# Patient Record
Sex: Female | Born: 1965
Health system: Southern US, Community
[De-identification: ages and names within clinical notes are randomized; demographics above are authoritative.]

## PROBLEM LIST (undated history)

## (undated) DIAGNOSIS — G2581 Restless legs syndrome: Secondary | ICD-10-CM

## (undated) DIAGNOSIS — E785 Hyperlipidemia, unspecified: Secondary | ICD-10-CM

## (undated) DIAGNOSIS — K219 Gastro-esophageal reflux disease without esophagitis: Secondary | ICD-10-CM

## (undated) DIAGNOSIS — I2510 Atherosclerotic heart disease of native coronary artery without angina pectoris: Secondary | ICD-10-CM

## (undated) DIAGNOSIS — R7303 Prediabetes: Secondary | ICD-10-CM

## (undated) DIAGNOSIS — F909 Attention-deficit hyperactivity disorder, unspecified type: Secondary | ICD-10-CM

## (undated) DIAGNOSIS — F419 Anxiety disorder, unspecified: Secondary | ICD-10-CM

## (undated) DIAGNOSIS — F32A Depression, unspecified: Secondary | ICD-10-CM

## (undated) DIAGNOSIS — G4733 Obstructive sleep apnea (adult) (pediatric): Secondary | ICD-10-CM

## (undated) DIAGNOSIS — R112 Nausea with vomiting, unspecified: Secondary | ICD-10-CM

## (undated) DIAGNOSIS — Z860100 Personal history of colon polyps, unspecified: Secondary | ICD-10-CM

## (undated) DIAGNOSIS — Z8742 Personal history of other diseases of the female genital tract: Secondary | ICD-10-CM

## (undated) DIAGNOSIS — N200 Calculus of kidney: Secondary | ICD-10-CM

## (undated) DIAGNOSIS — N1330 Unspecified hydronephrosis: Secondary | ICD-10-CM

## (undated) DIAGNOSIS — T7840XA Allergy, unspecified, initial encounter: Secondary | ICD-10-CM

## (undated) DIAGNOSIS — L719 Rosacea, unspecified: Secondary | ICD-10-CM

## (undated) DIAGNOSIS — K760 Fatty (change of) liver, not elsewhere classified: Secondary | ICD-10-CM

## (undated) DIAGNOSIS — E559 Vitamin D deficiency, unspecified: Secondary | ICD-10-CM

## (undated) DIAGNOSIS — M549 Dorsalgia, unspecified: Secondary | ICD-10-CM

## (undated) DIAGNOSIS — F329 Major depressive disorder, single episode, unspecified: Secondary | ICD-10-CM

## (undated) DIAGNOSIS — K5900 Constipation, unspecified: Secondary | ICD-10-CM

## (undated) DIAGNOSIS — Z9889 Other specified postprocedural states: Secondary | ICD-10-CM

## (undated) HISTORY — DX: Restless legs syndrome: G25.81

## (undated) HISTORY — DX: Constipation, unspecified: K59.00

## (undated) HISTORY — DX: Depression, unspecified: F32.A

## (undated) HISTORY — DX: Major depressive disorder, single episode, unspecified: F32.9

## (undated) HISTORY — DX: Obstructive sleep apnea (adult) (pediatric): G47.33

## (undated) HISTORY — PX: ABDOMINAL HYSTERECTOMY: SHX81

## (undated) HISTORY — DX: Attention-deficit hyperactivity disorder, unspecified type: F90.9

## (undated) HISTORY — DX: Rosacea, unspecified: L71.9

## (undated) HISTORY — DX: Dorsalgia, unspecified: M54.9

## (undated) HISTORY — PX: EXTRACORPOREAL SHOCK WAVE LITHOTRIPSY: SHX1557

## (undated) HISTORY — PX: OTHER SURGICAL HISTORY: SHX169

## (undated) HISTORY — DX: Hyperlipidemia, unspecified: E78.5

## (undated) HISTORY — PX: POLYPECTOMY: SHX149

## (undated) HISTORY — DX: Allergy, unspecified, initial encounter: T78.40XA

## (undated) HISTORY — DX: Atherosclerotic heart disease of native coronary artery without angina pectoris: I25.10

## (undated) HISTORY — DX: Vitamin D deficiency, unspecified: E55.9

## (undated) HISTORY — DX: Anxiety disorder, unspecified: F41.9

## (undated) HISTORY — PX: COLONOSCOPY: SHX174

## (undated) HISTORY — PX: CHOLECYSTECTOMY: SHX55

## (undated) HISTORY — PX: DIAGNOSTIC LAPAROSCOPY: SUR761

## (undated) HISTORY — DX: Calculus of kidney: N20.0

## (undated) HISTORY — DX: Fatty (change of) liver, not elsewhere classified: K76.0

---

## 1993-12-24 HISTORY — PX: TUBAL LIGATION: SHX77

## 1996-12-24 HISTORY — PX: CHOLECYSTECTOMY: SHX55

## 1997-12-24 HISTORY — PX: LAPAROSCOPIC CHOLECYSTECTOMY: SUR755

## 1998-03-01 ENCOUNTER — Ambulatory Visit (HOSPITAL_COMMUNITY): Admission: RE | Admit: 1998-03-01 | Discharge: 1998-03-01 | Payer: Self-pay | Admitting: Internal Medicine

## 1998-06-23 ENCOUNTER — Ambulatory Visit (HOSPITAL_COMMUNITY): Admission: RE | Admit: 1998-06-23 | Discharge: 1998-06-24 | Payer: Self-pay | Admitting: General Surgery

## 1999-01-24 HISTORY — PX: CYSTOSCOPY W/ URETERAL STENT PLACEMENT: SHX1429

## 1999-02-12 ENCOUNTER — Emergency Department (HOSPITAL_COMMUNITY): Admission: EM | Admit: 1999-02-12 | Discharge: 1999-02-12 | Payer: Self-pay | Admitting: Emergency Medicine

## 1999-02-14 ENCOUNTER — Encounter: Payer: Self-pay | Admitting: Urology

## 1999-02-14 ENCOUNTER — Ambulatory Visit (HOSPITAL_COMMUNITY): Admission: RE | Admit: 1999-02-14 | Discharge: 1999-02-14 | Payer: Self-pay | Admitting: Urology

## 1999-03-09 ENCOUNTER — Ambulatory Visit (HOSPITAL_BASED_OUTPATIENT_CLINIC_OR_DEPARTMENT_OTHER): Admission: RE | Admit: 1999-03-09 | Discharge: 1999-03-09 | Payer: Self-pay | Admitting: General Surgery

## 1999-03-29 ENCOUNTER — Other Ambulatory Visit: Admission: RE | Admit: 1999-03-29 | Discharge: 1999-03-29 | Payer: Self-pay | Admitting: Family Medicine

## 1999-09-18 ENCOUNTER — Encounter: Payer: Self-pay | Admitting: Urology

## 1999-09-18 ENCOUNTER — Ambulatory Visit (HOSPITAL_COMMUNITY): Admission: RE | Admit: 1999-09-18 | Discharge: 1999-09-18 | Payer: Self-pay | Admitting: Urology

## 2000-07-23 ENCOUNTER — Ambulatory Visit (HOSPITAL_BASED_OUTPATIENT_CLINIC_OR_DEPARTMENT_OTHER): Admission: RE | Admit: 2000-07-23 | Discharge: 2000-07-23 | Payer: Self-pay | Admitting: Orthopedic Surgery

## 2000-09-06 ENCOUNTER — Inpatient Hospital Stay (HOSPITAL_COMMUNITY): Admission: EM | Admit: 2000-09-06 | Discharge: 2000-09-09 | Payer: Self-pay | Admitting: *Deleted

## 2000-09-08 ENCOUNTER — Encounter: Payer: Self-pay | Admitting: Internal Medicine

## 2001-01-08 ENCOUNTER — Inpatient Hospital Stay (HOSPITAL_COMMUNITY): Admission: AD | Admit: 2001-01-08 | Discharge: 2001-01-08 | Payer: Self-pay | Admitting: Obstetrics

## 2001-01-10 ENCOUNTER — Ambulatory Visit (HOSPITAL_COMMUNITY): Admission: RE | Admit: 2001-01-10 | Discharge: 2001-01-10 | Payer: Self-pay | Admitting: Obstetrics and Gynecology

## 2001-11-15 ENCOUNTER — Emergency Department (HOSPITAL_COMMUNITY): Admission: EM | Admit: 2001-11-15 | Discharge: 2001-11-15 | Payer: Self-pay | Admitting: Emergency Medicine

## 2001-12-24 HISTORY — PX: UMBILICAL HERNIA REPAIR: SHX196

## 2002-07-29 ENCOUNTER — Other Ambulatory Visit: Admission: RE | Admit: 2002-07-29 | Discharge: 2002-07-29 | Payer: Self-pay | Admitting: Obstetrics and Gynecology

## 2003-03-09 ENCOUNTER — Encounter: Payer: Self-pay | Admitting: Internal Medicine

## 2003-03-09 ENCOUNTER — Ambulatory Visit (HOSPITAL_COMMUNITY): Admission: RE | Admit: 2003-03-09 | Discharge: 2003-03-09 | Payer: Self-pay | Admitting: Internal Medicine

## 2003-04-04 ENCOUNTER — Emergency Department (HOSPITAL_COMMUNITY): Admission: EM | Admit: 2003-04-04 | Discharge: 2003-04-05 | Payer: Self-pay | Admitting: Emergency Medicine

## 2003-04-05 ENCOUNTER — Encounter: Payer: Self-pay | Admitting: Emergency Medicine

## 2004-02-22 ENCOUNTER — Inpatient Hospital Stay (HOSPITAL_COMMUNITY): Admission: AD | Admit: 2004-02-22 | Discharge: 2004-02-22 | Payer: Self-pay | Admitting: Obstetrics and Gynecology

## 2004-02-23 ENCOUNTER — Encounter (INDEPENDENT_AMBULATORY_CARE_PROVIDER_SITE_OTHER): Payer: Self-pay | Admitting: *Deleted

## 2004-02-23 HISTORY — PX: OTHER SURGICAL HISTORY: SHX169

## 2004-02-24 ENCOUNTER — Inpatient Hospital Stay (HOSPITAL_COMMUNITY): Admission: AD | Admit: 2004-02-24 | Discharge: 2004-02-25 | Payer: Self-pay | Admitting: Obstetrics and Gynecology

## 2004-03-23 ENCOUNTER — Ambulatory Visit (HOSPITAL_COMMUNITY): Admission: RE | Admit: 2004-03-23 | Discharge: 2004-03-23 | Payer: Self-pay | Admitting: Obstetrics and Gynecology

## 2004-08-02 ENCOUNTER — Ambulatory Visit (HOSPITAL_COMMUNITY): Admission: RE | Admit: 2004-08-02 | Discharge: 2004-08-02 | Payer: Self-pay | Admitting: Obstetrics and Gynecology

## 2004-12-15 ENCOUNTER — Ambulatory Visit: Payer: Self-pay | Admitting: Internal Medicine

## 2005-05-07 ENCOUNTER — Ambulatory Visit: Payer: Self-pay | Admitting: Internal Medicine

## 2005-08-01 ENCOUNTER — Ambulatory Visit: Payer: Self-pay | Admitting: Internal Medicine

## 2005-08-08 ENCOUNTER — Ambulatory Visit: Payer: Self-pay | Admitting: Internal Medicine

## 2005-08-31 ENCOUNTER — Other Ambulatory Visit: Admission: RE | Admit: 2005-08-31 | Discharge: 2005-08-31 | Payer: Self-pay | Admitting: Obstetrics and Gynecology

## 2005-09-07 ENCOUNTER — Inpatient Hospital Stay (HOSPITAL_COMMUNITY): Admission: AD | Admit: 2005-09-07 | Discharge: 2005-09-08 | Payer: Self-pay | Admitting: Obstetrics and Gynecology

## 2005-12-19 ENCOUNTER — Ambulatory Visit: Payer: Self-pay | Admitting: Internal Medicine

## 2006-01-02 ENCOUNTER — Ambulatory Visit: Payer: Self-pay | Admitting: Internal Medicine

## 2006-01-08 ENCOUNTER — Ambulatory Visit: Payer: Self-pay | Admitting: Internal Medicine

## 2006-10-08 ENCOUNTER — Ambulatory Visit: Payer: Self-pay | Admitting: Internal Medicine

## 2006-10-08 ENCOUNTER — Emergency Department (HOSPITAL_COMMUNITY): Admission: EM | Admit: 2006-10-08 | Discharge: 2006-10-08 | Payer: Self-pay | Admitting: Emergency Medicine

## 2006-10-15 ENCOUNTER — Ambulatory Visit: Payer: Self-pay | Admitting: Internal Medicine

## 2006-12-06 ENCOUNTER — Ambulatory Visit: Payer: Self-pay | Admitting: Internal Medicine

## 2007-02-22 ENCOUNTER — Emergency Department (HOSPITAL_COMMUNITY): Admission: EM | Admit: 2007-02-22 | Discharge: 2007-02-23 | Payer: Self-pay | Admitting: Emergency Medicine

## 2007-02-25 ENCOUNTER — Ambulatory Visit: Payer: Self-pay | Admitting: Internal Medicine

## 2007-02-26 LAB — CONVERTED CEMR LAB
ALT: 39 units/L (ref 0–40)
AST: 23 units/L (ref 0–37)
Albumin: 3.7 g/dL (ref 3.5–5.2)
Alkaline Phosphatase: 69 units/L (ref 39–117)
Amylase: 44 units/L (ref 27–131)
Basophils Absolute: 0 10*3/uL (ref 0.0–0.1)
Basophils Relative: 0.2 % (ref 0.0–1.0)
Bilirubin, Direct: 0.1 mg/dL (ref 0.0–0.3)
Eosinophils Absolute: 0.1 10*3/uL (ref 0.0–0.6)
Eosinophils Relative: 1 % (ref 0.0–5.0)
H Pylori IgG: NEGATIVE
HCT: 43.5 % (ref 36.0–46.0)
Hemoglobin: 15 g/dL (ref 12.0–15.0)
Lipase: 25 units/L (ref 11.0–59.0)
Lymphocytes Relative: 30.6 % (ref 12.0–46.0)
MCHC: 34.5 g/dL (ref 30.0–36.0)
MCV: 89.9 fL (ref 78.0–100.0)
Monocytes Absolute: 0.4 10*3/uL (ref 0.2–0.7)
Monocytes Relative: 4 % (ref 3.0–11.0)
Neutro Abs: 6.1 10*3/uL (ref 1.4–7.7)
Neutrophils Relative %: 64.2 % (ref 43.0–77.0)
Platelets: 267 10*3/uL (ref 150–400)
RBC: 4.84 M/uL (ref 3.87–5.11)
RDW: 12.5 % (ref 11.5–14.6)
Total Bilirubin: 0.8 mg/dL (ref 0.3–1.2)
Total Protein: 7.2 g/dL (ref 6.0–8.3)
WBC: 9.5 10*3/uL (ref 4.5–10.5)

## 2007-05-02 ENCOUNTER — Ambulatory Visit (HOSPITAL_COMMUNITY): Admission: RE | Admit: 2007-05-02 | Discharge: 2007-05-02 | Payer: Self-pay | Admitting: Urology

## 2007-05-02 ENCOUNTER — Ambulatory Visit: Payer: Self-pay | Admitting: Cardiology

## 2007-05-07 ENCOUNTER — Ambulatory Visit: Payer: Self-pay | Admitting: Gastroenterology

## 2007-05-07 LAB — CONVERTED CEMR LAB
ALT: 103 units/L — ABNORMAL HIGH (ref 0–40)
AST: 33 units/L (ref 0–37)
Albumin: 3.6 g/dL (ref 3.5–5.2)
Alkaline Phosphatase: 78 units/L (ref 39–117)
BUN: 7 mg/dL (ref 6–23)
Basophils Absolute: 0 10*3/uL (ref 0.0–0.1)
Basophils Relative: 0.4 % (ref 0.0–1.0)
Bilirubin, Direct: 0.1 mg/dL (ref 0.0–0.3)
CO2: 29 meq/L (ref 19–32)
Calcium: 8.8 mg/dL (ref 8.4–10.5)
Chloride: 111 meq/L (ref 96–112)
Creatinine, Ser: 0.8 mg/dL (ref 0.4–1.2)
Eosinophils Absolute: 0.1 10*3/uL (ref 0.0–0.6)
Eosinophils Relative: 1 % (ref 0.0–5.0)
GFR calc Af Amer: 102 mL/min
GFR calc non Af Amer: 84 mL/min
Glucose, Bld: 100 mg/dL — ABNORMAL HIGH (ref 70–99)
HCT: 40.4 % (ref 36.0–46.0)
Hemoglobin: 13.6 g/dL (ref 12.0–15.0)
Lymphocytes Relative: 26 % (ref 12.0–46.0)
MCHC: 33.7 g/dL (ref 30.0–36.0)
MCV: 89.9 fL (ref 78.0–100.0)
Monocytes Absolute: 0.3 10*3/uL (ref 0.2–0.7)
Monocytes Relative: 4.5 % (ref 3.0–11.0)
Neutro Abs: 4.5 10*3/uL (ref 1.4–7.7)
Neutrophils Relative %: 68.1 % (ref 43.0–77.0)
Platelets: 232 10*3/uL (ref 150–400)
Potassium: 4.5 meq/L (ref 3.5–5.1)
RBC: 4.49 M/uL (ref 3.87–5.11)
RDW: 12.6 % (ref 11.5–14.6)
Sodium: 143 meq/L (ref 135–145)
TSH: 2.16 microintl units/mL (ref 0.35–5.50)
Total Bilirubin: 0.5 mg/dL (ref 0.3–1.2)
Total Protein: 6.6 g/dL (ref 6.0–8.3)
WBC: 6.6 10*3/uL (ref 4.5–10.5)

## 2007-05-09 ENCOUNTER — Encounter: Payer: Self-pay | Admitting: Internal Medicine

## 2007-05-09 ENCOUNTER — Ambulatory Visit: Payer: Self-pay | Admitting: Gastroenterology

## 2007-05-09 ENCOUNTER — Encounter: Payer: Self-pay | Admitting: Gastroenterology

## 2007-05-09 HISTORY — PX: ESOPHAGOGASTRODUODENOSCOPY: SHX1529

## 2007-05-21 ENCOUNTER — Ambulatory Visit (HOSPITAL_COMMUNITY): Admission: RE | Admit: 2007-05-21 | Discharge: 2007-05-21 | Payer: Self-pay | Admitting: Urology

## 2007-05-23 ENCOUNTER — Ambulatory Visit: Payer: Self-pay | Admitting: Gastroenterology

## 2007-05-23 ENCOUNTER — Ambulatory Visit: Payer: Self-pay | Admitting: Internal Medicine

## 2007-05-23 LAB — CONVERTED CEMR LAB
ALT: 15 units/L (ref 0–40)
AST: 13 units/L (ref 0–37)
Albumin: 3.8 g/dL (ref 3.5–5.2)
Alkaline Phosphatase: 65 units/L (ref 39–117)
Bilirubin, Direct: 0.1 mg/dL (ref 0.0–0.3)
Total Bilirubin: 0.7 mg/dL (ref 0.3–1.2)
Total Protein: 7.1 g/dL (ref 6.0–8.3)

## 2007-05-27 ENCOUNTER — Ambulatory Visit: Payer: Self-pay | Admitting: Gastroenterology

## 2007-05-30 ENCOUNTER — Encounter: Payer: Self-pay | Admitting: Gastroenterology

## 2007-05-30 ENCOUNTER — Encounter: Payer: Self-pay | Admitting: Internal Medicine

## 2007-05-30 ENCOUNTER — Ambulatory Visit: Payer: Self-pay | Admitting: Gastroenterology

## 2007-05-30 HISTORY — PX: COLONOSCOPY W/ POLYPECTOMY: SHX1380

## 2007-07-11 ENCOUNTER — Emergency Department (HOSPITAL_COMMUNITY): Admission: EM | Admit: 2007-07-11 | Discharge: 2007-07-11 | Payer: Self-pay | Admitting: Emergency Medicine

## 2007-09-02 ENCOUNTER — Inpatient Hospital Stay (HOSPITAL_COMMUNITY): Admission: RE | Admit: 2007-09-02 | Discharge: 2007-09-04 | Payer: Self-pay | Admitting: Obstetrics and Gynecology

## 2007-09-02 ENCOUNTER — Encounter (INDEPENDENT_AMBULATORY_CARE_PROVIDER_SITE_OTHER): Payer: Self-pay | Admitting: Obstetrics and Gynecology

## 2007-09-02 HISTORY — PX: OTHER SURGICAL HISTORY: SHX169

## 2007-09-05 ENCOUNTER — Telehealth: Payer: Self-pay | Admitting: Internal Medicine

## 2007-09-21 ENCOUNTER — Emergency Department (HOSPITAL_COMMUNITY): Admission: EM | Admit: 2007-09-21 | Discharge: 2007-09-21 | Payer: Self-pay | Admitting: Emergency Medicine

## 2007-09-22 ENCOUNTER — Ambulatory Visit (HOSPITAL_COMMUNITY): Admission: RE | Admit: 2007-09-22 | Discharge: 2007-09-22 | Payer: Self-pay | Admitting: Emergency Medicine

## 2007-09-22 ENCOUNTER — Ambulatory Visit: Payer: Self-pay | Admitting: Surgery

## 2007-09-29 DIAGNOSIS — Z87442 Personal history of urinary calculi: Secondary | ICD-10-CM | POA: Insufficient documentation

## 2008-02-17 ENCOUNTER — Ambulatory Visit: Payer: Self-pay | Admitting: Internal Medicine

## 2008-02-17 LAB — CONVERTED CEMR LAB
ALT: 14 units/L (ref 0–35)
AST: 15 units/L (ref 0–37)
Albumin: 3.7 g/dL (ref 3.5–5.2)
Alkaline Phosphatase: 67 units/L (ref 39–117)
BUN: 7 mg/dL (ref 6–23)
Basophils Absolute: 0 10*3/uL (ref 0.0–0.1)
Basophils Relative: 0.4 % (ref 0.0–1.0)
Bilirubin Urine: NEGATIVE
Bilirubin, Direct: 0.2 mg/dL (ref 0.0–0.3)
CO2: 30 meq/L (ref 19–32)
Calcium: 9.6 mg/dL (ref 8.4–10.5)
Chloride: 103 meq/L (ref 96–112)
Cholesterol: 189 mg/dL (ref 0–200)
Creatinine, Ser: 0.7 mg/dL (ref 0.4–1.2)
Eosinophils Absolute: 0.1 10*3/uL (ref 0.0–0.6)
Eosinophils Relative: 0.7 % (ref 0.0–5.0)
Estradiol: 82 pg/mL
FSH: 8.9 milliintl units/mL
GFR calc Af Amer: 118 mL/min
GFR calc non Af Amer: 98 mL/min
Glucose, Bld: 91 mg/dL (ref 70–99)
Glucose, Urine, Semiquant: NEGATIVE
HCT: 43.6 % (ref 36.0–46.0)
HDL: 23.2 mg/dL — ABNORMAL LOW (ref >39.0)
Hemoglobin: 14.9 g/dL (ref 12.0–15.0)
Ketones, urine, test strip: NEGATIVE
LDL Cholesterol: 137 mg/dL — ABNORMAL HIGH (ref 0–99)
Lymphocytes Relative: 30.6 % (ref 12.0–46.0)
MCHC: 34.3 g/dL (ref 30.0–36.0)
MCV: 87.3 fL (ref 78.0–100.0)
Monocytes Absolute: 0.6 10*3/uL (ref 0.2–0.7)
Monocytes Relative: 7 % (ref 3.0–11.0)
Neutro Abs: 5.3 10*3/uL (ref 1.4–7.7)
Neutrophils Relative %: 61.3 % (ref 43.0–77.0)
Nitrite: NEGATIVE
Platelets: 266 10*3/uL (ref 150–400)
Potassium: 5.4 meq/L — ABNORMAL HIGH (ref 3.5–5.1)
Protein, U semiquant: NEGATIVE
RBC: 4.99 M/uL (ref 3.87–5.11)
RDW: 12.8 % (ref 11.5–14.6)
Sodium: 140 meq/L (ref 135–145)
Specific Gravity, Urine: 1.02
TSH: 1.27 microintl units/mL (ref 0.35–5.50)
Total Bilirubin: 0.7 mg/dL (ref 0.3–1.2)
Total CHOL/HDL Ratio: 8.1
Total Protein: 6.6 g/dL (ref 6.0–8.3)
Triglycerides: 145 mg/dL (ref 0–149)
Urobilinogen, UA: 0.2
VLDL: 29 mg/dL (ref 0–40)
WBC Urine, dipstick: NEGATIVE
WBC: 8.7 10*3/uL (ref 4.5–10.5)
pH: 7

## 2008-02-19 ENCOUNTER — Telehealth: Payer: Self-pay | Admitting: Internal Medicine

## 2008-02-26 ENCOUNTER — Ambulatory Visit: Payer: Self-pay | Admitting: Internal Medicine

## 2008-02-26 DIAGNOSIS — G43909 Migraine, unspecified, not intractable, without status migrainosus: Secondary | ICD-10-CM | POA: Insufficient documentation

## 2008-02-26 DIAGNOSIS — N951 Menopausal and female climacteric states: Secondary | ICD-10-CM | POA: Insufficient documentation

## 2008-03-10 ENCOUNTER — Telehealth: Payer: Self-pay | Admitting: Internal Medicine

## 2008-03-22 ENCOUNTER — Ambulatory Visit: Payer: Self-pay | Admitting: Internal Medicine

## 2008-03-22 DIAGNOSIS — R635 Abnormal weight gain: Secondary | ICD-10-CM | POA: Insufficient documentation

## 2008-04-19 ENCOUNTER — Ambulatory Visit: Payer: Self-pay | Admitting: Internal Medicine

## 2008-05-04 ENCOUNTER — Telehealth: Payer: Self-pay | Admitting: Internal Medicine

## 2008-05-15 DIAGNOSIS — K648 Other hemorrhoids: Secondary | ICD-10-CM | POA: Insufficient documentation

## 2008-05-15 DIAGNOSIS — D126 Benign neoplasm of colon, unspecified: Secondary | ICD-10-CM | POA: Insufficient documentation

## 2008-05-15 DIAGNOSIS — K429 Umbilical hernia without obstruction or gangrene: Secondary | ICD-10-CM | POA: Insufficient documentation

## 2008-06-09 ENCOUNTER — Telehealth: Payer: Self-pay | Admitting: Internal Medicine

## 2008-07-23 ENCOUNTER — Ambulatory Visit: Payer: Self-pay | Admitting: Internal Medicine

## 2008-07-23 DIAGNOSIS — F5102 Adjustment insomnia: Secondary | ICD-10-CM | POA: Insufficient documentation

## 2008-09-06 ENCOUNTER — Ambulatory Visit: Payer: Self-pay | Admitting: Internal Medicine

## 2008-09-06 DIAGNOSIS — G2581 Restless legs syndrome: Secondary | ICD-10-CM | POA: Insufficient documentation

## 2008-10-18 ENCOUNTER — Ambulatory Visit: Payer: Self-pay | Admitting: Internal Medicine

## 2008-10-21 IMAGING — CT CT ABDOMEN W/O CM
2 of 4 series · 17 of 46 positions shown, 19 images · non-contrast
Comparison: Unenhanced CT abdomen and pelvis 10/08/2006.

CLINICAL DATA: Epigastric abdominal pain radiating to the back associated with
nausea over the past 2 months. Surgical history includes cholecystectomy, hernia
repair, and right renal lithotripsy.

CT ABDOMEN WITHOUT CONTRAST 05/02/2007:
TECHNIQUE: Multidetector CT imaging of the upper abdomen was performed
following the standard protocol without IV contrast.  Oral contrast was
administered.

[Series 2: abd_w/o 5.0 b30f st · axial · 0.76mm/px · z∈[+958,+1228]mm · 14 of 60 slices shown, 16 images]
[im 3/60  soft-tissue]
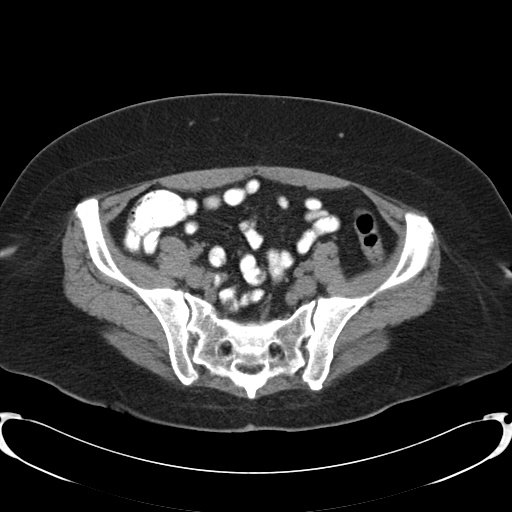
[im 3/60  bone]
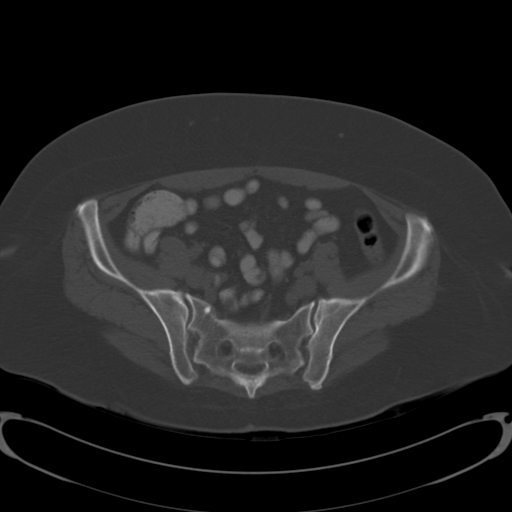
[im 7/60  soft-tissue]
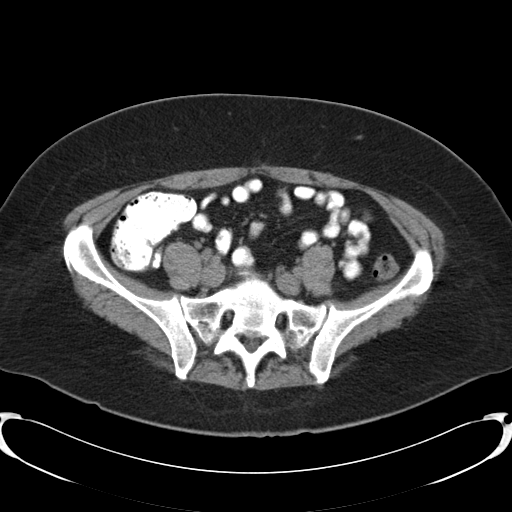
[im 12/60  soft-tissue]
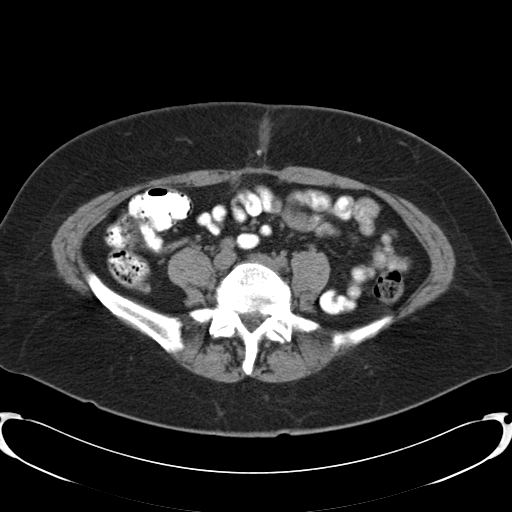
[im 16/60  soft-tissue]
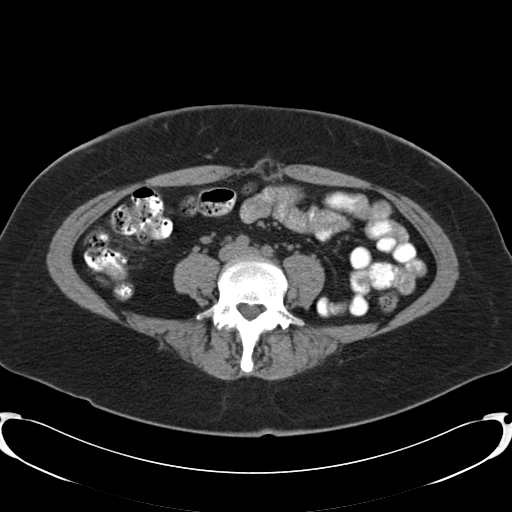
[im 21/60  soft-tissue]
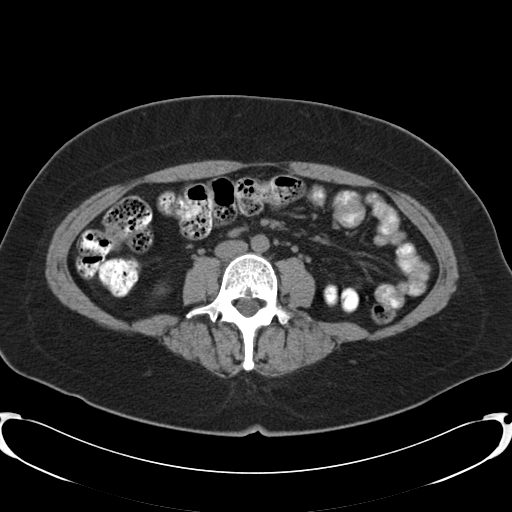
[im 23/60  soft-tissue]
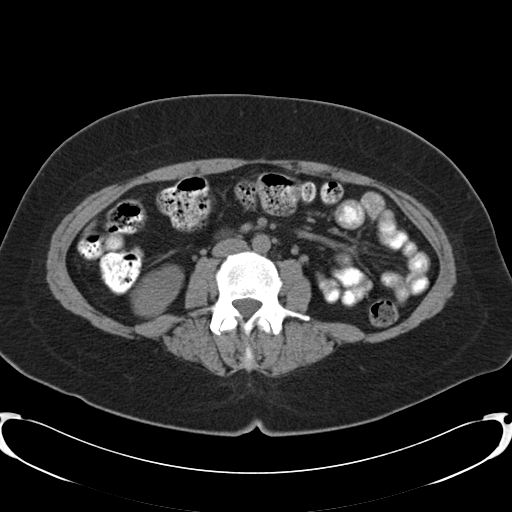
[im 28/60  soft-tissue]
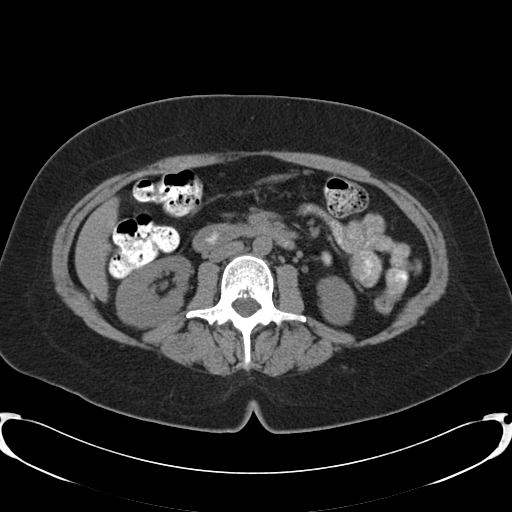
[im 32/60  soft-tissue]
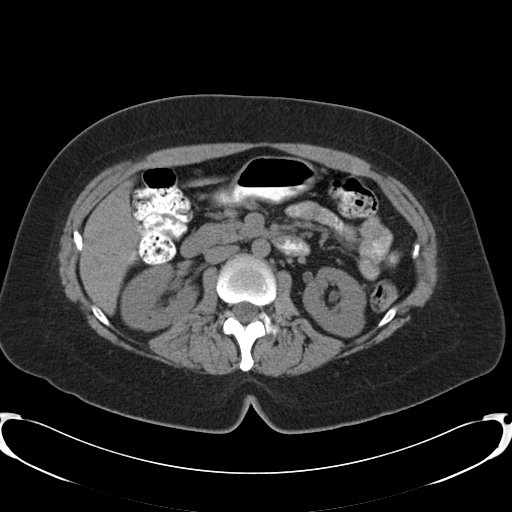
[im 37/60  soft-tissue]
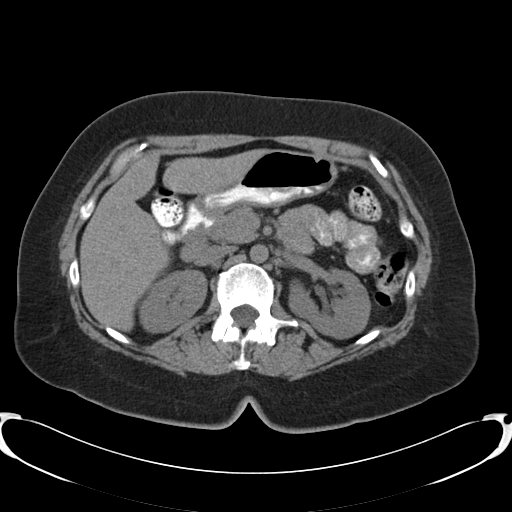
[im 37/60  bone]
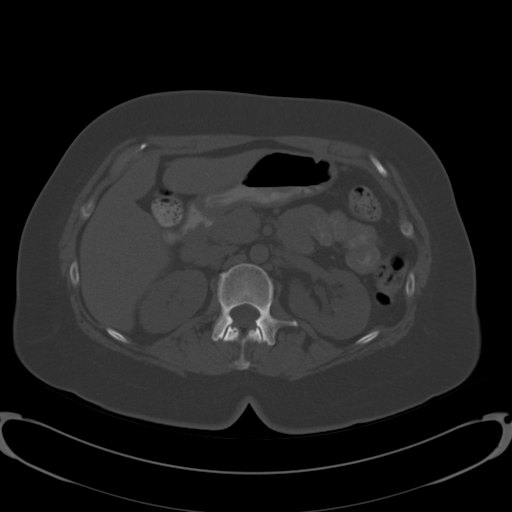
[im 39/60  soft-tissue]
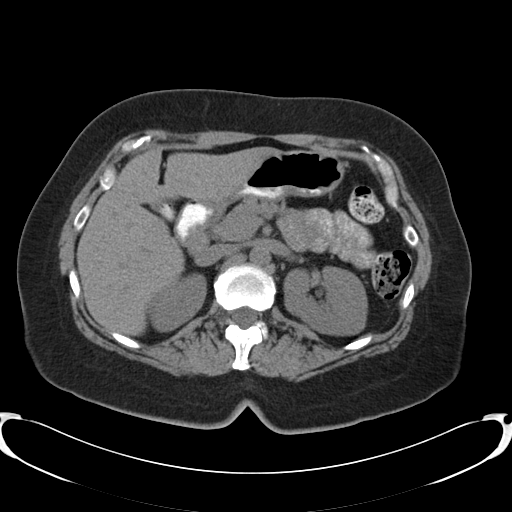
[im 44/60  soft-tissue]
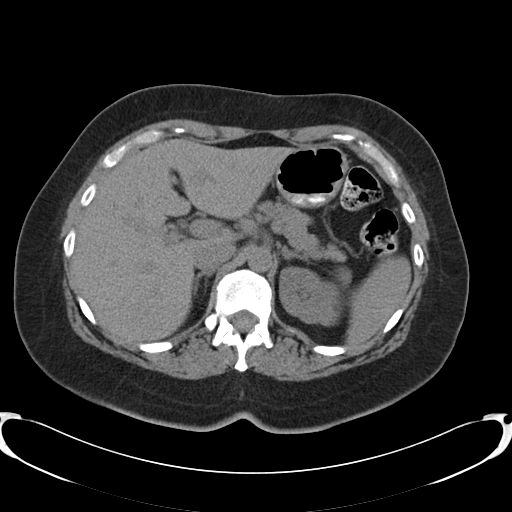
[im 48/60  soft-tissue]
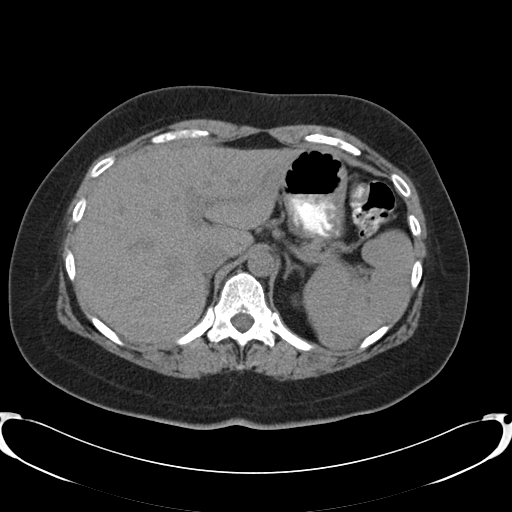
[im 53/60  soft-tissue]
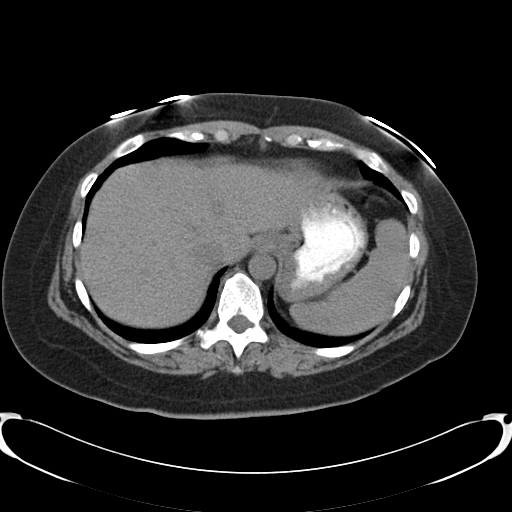
[im 57/60  soft-tissue]
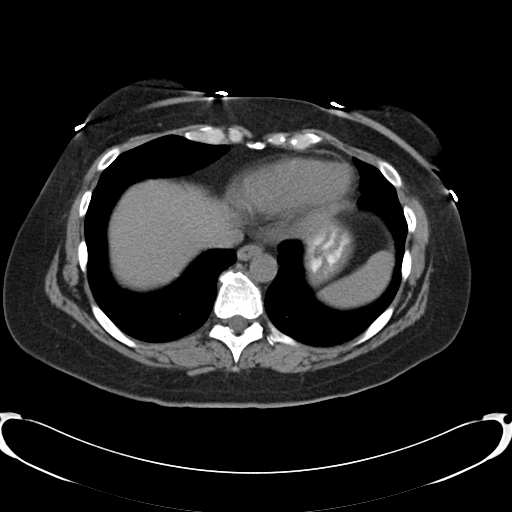

[Series 602: <mpr thick range> · coronal · 0.76mm/px · 3 of 68 slices shown]
[im 23/68  soft-tissue]
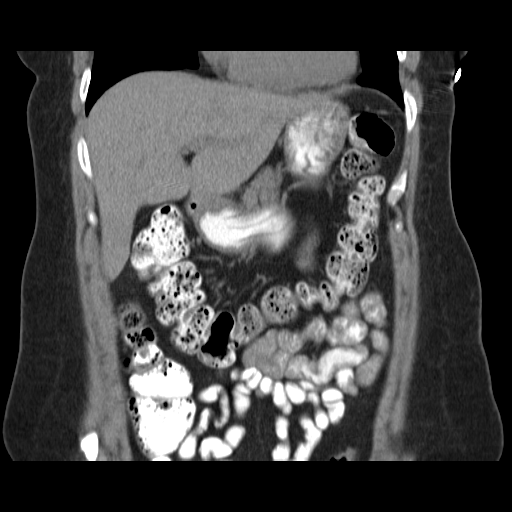
[im 30/68  soft-tissue]
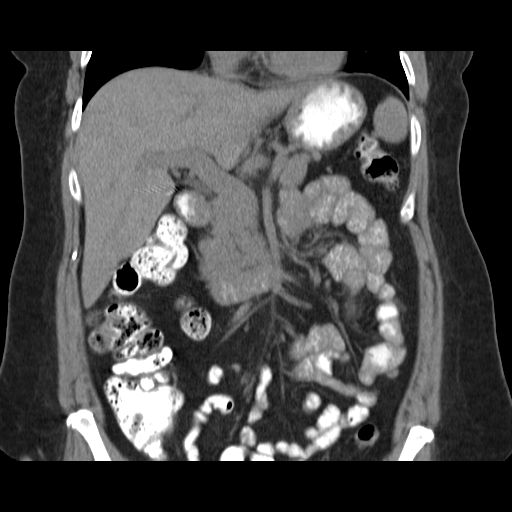
[im 38/68  soft-tissue]
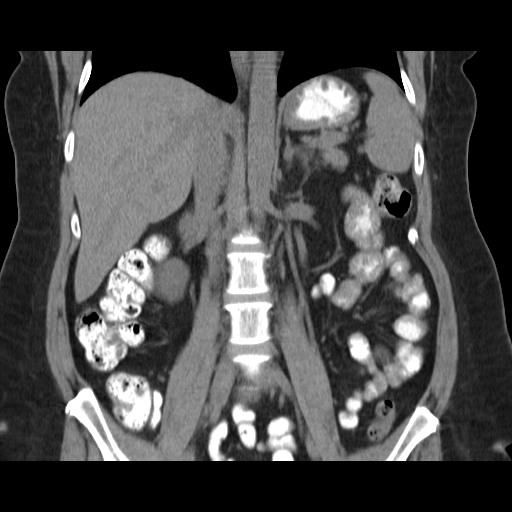

[17 of 46 positions shown; findings below may reference images not displayed]

FINDINGS: Approximately 4 mm left lower pole renal calculus, new since the
prior examination. Mild left hydronephrosis, also new. No proximal or mid
ureteral calculi; examination only included the abdomen, so the distal ureter
not evaluated. No right upper urinary tract calculi. Within the limits of the
unenhanced technique, no focal abnormalities involving either kidney.

Again within the limits of the unenhanced technique, normal appearing liver,
spleen, and pancreas. Small focus of accessory splenic tissue medial to the
spleen at the hilum again noted. Adrenal glands normal. Gallbladder surgically
absent. No biliary ductal dilation. Stomach and visualized colon and small bowel
unremarkable. Abdominal aorta without atherosclerotic calcification. No
significant lymphadenopathy. No free fluid. Normal appearing retrocecal appendix
in the right mid abdomen. Visualized lung bases clear. Bone window images
demonstrating degenerative disc disease at L5-S1 and lower thoracic spondylosis.
IMPRESSION: 1. Approximately 4 mm left lower pole renal calculus.
2. Mild left hydronephrosis. No proximal or mid left ureteral calculus. If the
patient has left flank pain, a distal ureteral calculus might be considered.
3. No acute abnormalities otherwise in the abdomen, status post cholecystectomy.

## 2008-11-02 ENCOUNTER — Ambulatory Visit: Payer: Self-pay | Admitting: Family Medicine

## 2008-11-02 DIAGNOSIS — S139XXA Sprain of joints and ligaments of unspecified parts of neck, initial encounter: Secondary | ICD-10-CM | POA: Insufficient documentation

## 2008-11-03 ENCOUNTER — Encounter: Admission: RE | Admit: 2008-11-03 | Discharge: 2008-12-13 | Payer: Self-pay | Admitting: Family Medicine

## 2008-12-29 ENCOUNTER — Ambulatory Visit: Payer: Self-pay | Admitting: Internal Medicine

## 2008-12-29 DIAGNOSIS — L2089 Other atopic dermatitis: Secondary | ICD-10-CM | POA: Insufficient documentation

## 2009-01-07 ENCOUNTER — Telehealth: Payer: Self-pay | Admitting: Internal Medicine

## 2009-02-16 ENCOUNTER — Telehealth: Payer: Self-pay | Admitting: Internal Medicine

## 2009-03-10 ENCOUNTER — Emergency Department (HOSPITAL_COMMUNITY): Admission: EM | Admit: 2009-03-10 | Discharge: 2009-03-10 | Payer: Self-pay | Admitting: Emergency Medicine

## 2009-03-11 ENCOUNTER — Encounter: Payer: Self-pay | Admitting: Internal Medicine

## 2009-03-21 ENCOUNTER — Ambulatory Visit: Payer: Self-pay | Admitting: Internal Medicine

## 2009-03-21 DIAGNOSIS — R319 Hematuria, unspecified: Secondary | ICD-10-CM | POA: Insufficient documentation

## 2009-03-21 DIAGNOSIS — R109 Unspecified abdominal pain: Secondary | ICD-10-CM | POA: Insufficient documentation

## 2009-03-23 ENCOUNTER — Telehealth: Payer: Self-pay | Admitting: Internal Medicine

## 2009-03-24 ENCOUNTER — Encounter: Payer: Self-pay | Admitting: Internal Medicine

## 2009-03-24 ENCOUNTER — Telehealth: Payer: Self-pay | Admitting: Internal Medicine

## 2009-03-28 ENCOUNTER — Encounter: Payer: Self-pay | Admitting: Internal Medicine

## 2009-03-30 ENCOUNTER — Ambulatory Visit: Payer: Self-pay | Admitting: Internal Medicine

## 2009-03-30 DIAGNOSIS — R1032 Left lower quadrant pain: Secondary | ICD-10-CM | POA: Insufficient documentation

## 2009-03-30 LAB — CONVERTED CEMR LAB
Basophils Absolute: 0.1 10*3/uL (ref 0.0–0.1)
Basophils Relative: 1.1 % (ref 0.0–3.0)
Bilirubin Urine: NEGATIVE
Eosinophils Absolute: 0.1 10*3/uL (ref 0.0–0.7)
Eosinophils Relative: 1 % (ref 0.0–5.0)
Glucose, Urine, Semiquant: NEGATIVE
HCT: 40.5 % (ref 36.0–46.0)
Hemoglobin: 13.7 g/dL (ref 12.0–15.0)
Ketones, urine, test strip: NEGATIVE
Lymphocytes Relative: 32.5 % (ref 12.0–46.0)
Lymphs Abs: 2.5 10*3/uL (ref 0.7–4.0)
MCHC: 33.8 g/dL (ref 30.0–36.0)
MCV: 88 fL (ref 78.0–100.0)
Monocytes Absolute: 0.2 10*3/uL (ref 0.1–1.0)
Monocytes Relative: 2.8 % — ABNORMAL LOW (ref 3.0–12.0)
Neutro Abs: 4.7 10*3/uL (ref 1.4–7.7)
Neutrophils Relative %: 62.6 % (ref 43.0–77.0)
Nitrite: NEGATIVE
Platelets: 242 10*3/uL (ref 150.0–400.0)
Protein, U semiquant: NEGATIVE
RBC: 4.6 M/uL (ref 3.87–5.11)
RDW: 12.6 % (ref 11.5–14.6)
Specific Gravity, Urine: <1.005
Urobilinogen, UA: 0.2
WBC Urine, dipstick: NEGATIVE
WBC: 7.6 10*3/uL (ref 4.5–10.5)
pH: 6

## 2009-04-01 ENCOUNTER — Telehealth: Payer: Self-pay | Admitting: Internal Medicine

## 2009-04-14 ENCOUNTER — Ambulatory Visit: Payer: Self-pay | Admitting: Internal Medicine

## 2009-04-14 ENCOUNTER — Telehealth: Payer: Self-pay | Admitting: Internal Medicine

## 2009-04-23 ENCOUNTER — Emergency Department (HOSPITAL_COMMUNITY): Admission: EM | Admit: 2009-04-23 | Discharge: 2009-04-23 | Payer: Self-pay | Admitting: Emergency Medicine

## 2009-04-26 ENCOUNTER — Ambulatory Visit: Payer: Self-pay | Admitting: Internal Medicine

## 2009-04-26 DIAGNOSIS — L723 Sebaceous cyst: Secondary | ICD-10-CM | POA: Insufficient documentation

## 2009-05-03 ENCOUNTER — Ambulatory Visit: Payer: Self-pay | Admitting: Internal Medicine

## 2009-05-03 DIAGNOSIS — F19939 Other psychoactive substance use, unspecified with withdrawal, unspecified: Secondary | ICD-10-CM

## 2009-05-03 DIAGNOSIS — F19239 Other psychoactive substance dependence with withdrawal, unspecified: Secondary | ICD-10-CM | POA: Insufficient documentation

## 2009-05-05 ENCOUNTER — Telehealth: Payer: Self-pay | Admitting: Internal Medicine

## 2009-05-25 ENCOUNTER — Ambulatory Visit: Payer: Self-pay | Admitting: Internal Medicine

## 2009-05-25 DIAGNOSIS — F341 Dysthymic disorder: Secondary | ICD-10-CM | POA: Insufficient documentation

## 2009-05-25 DIAGNOSIS — L708 Other acne: Secondary | ICD-10-CM | POA: Insufficient documentation

## 2009-06-11 ENCOUNTER — Telehealth: Payer: Self-pay | Admitting: Internal Medicine

## 2009-07-22 ENCOUNTER — Emergency Department (HOSPITAL_COMMUNITY): Admission: EM | Admit: 2009-07-22 | Discharge: 2009-07-23 | Payer: Self-pay | Admitting: Emergency Medicine

## 2009-07-27 ENCOUNTER — Emergency Department (HOSPITAL_COMMUNITY): Admission: EM | Admit: 2009-07-27 | Discharge: 2009-07-27 | Payer: Self-pay | Admitting: Emergency Medicine

## 2009-11-29 ENCOUNTER — Emergency Department (HOSPITAL_COMMUNITY): Admission: EM | Admit: 2009-11-29 | Discharge: 2009-11-29 | Payer: Self-pay | Admitting: Family Medicine

## 2009-12-20 ENCOUNTER — Encounter: Admission: RE | Admit: 2009-12-20 | Discharge: 2009-12-20 | Payer: Self-pay | Admitting: Family Medicine

## 2010-11-03 ENCOUNTER — Telehealth: Payer: Self-pay | Admitting: Internal Medicine

## 2010-12-24 HISTORY — PX: SHOULDER ARTHROSCOPY WITH OPEN ROTATOR CUFF REPAIR: SHX6092

## 2011-01-23 NOTE — Procedures (Signed)
Summary: Gastroenterology colon  Gastroenterology colon   Imported By: Donneta Romberg Endo Tech 05/15/2008 12:30:07  _____________________________________________________________________  External Attachment:    Type:   Image     Comment:   External Document

## 2011-01-23 NOTE — Assessment & Plan Note (Signed)
Summary: 3-4 wks rov/nta   Vital Signs:  Patient Profile:   45 Years Old Female Height:     67 inches Weight:      216 pounds Temp:     98.2 degrees F oral Pulse rate:   76 / minute Resp:     14 per minute BP sitting:   140 / 84  (left arm)  Vitals Entered By: [name], LPN (March 22, 2008 11:27 AM)                 Chief Complaint:  roa pritstiq-works ok with no se. but still  has headaches.  History of Present Illness: Improved anger on the pristiq Still has increased pattern of HA After the hyterectomy sleep pattern is disturbed Hot flashes are better Sexual functioning improved with the testosterone by OB  gyn    Current Allergies: * IVP DYE  Past Medical History:    Reviewed history from 09/29/2007 and no changes required:       UTIs       Nephrolithiasis, hx of  Past Surgical History:    Reviewed history from 09/29/2007 and no changes required:       Colonoscopy       Cholecystectomy       herniorrhaphy       Laparoscopy   Family History:    Reviewed history from 09/29/2007 and no changes required:       Family History Breast cancer 1st degree relative <50       Family History of Cardiovascular disorder  Social History:    Reviewed history from 09/29/2007 and no changes required:       Occupation:       Married       Current Smoker       Alcohol use-no       Drug use-no    Review of Systems       The patient complains of weight gain.  The patient denies decreased hearing, hoarseness, chest pain, syncope, dyspnea on exhertion, prolonged cough, hemoptysis, abdominal pain, melena, and hematochezia.         insomnia   Physical Exam  General:     alert and overweight-appearing.   Head:     normocephalic and atraumatic.   Ears:     R ear normal and L ear normal.   Nose:     no external deformity and no nasal discharge.   Mouth:     good dentition and pharynx pink and moist.   Neck:     supple and no masses.   Lungs:  normal respiratory effort.   Heart:     normal rate, regular rhythm, no murmur, and no gallop.   Abdomen:     soft, normal bowel sounds, no masses, and no guarding.   Msk:     No deformity or scoliosis noted of thoracic or lumbar spine.   Extremities:     No clubbing, cyanosis, edema, or deformity noted with normal full range of motion of all joints.   Neurologic:     No cranial nerve deficits noted. Station and gait are normal. Plantar reflexes are down-going bilaterally. DTRs are symmetrical throughout. Sensory, motor and coordinative functions appear intact. Psych:     subdued and withdrawn.      Impression & Recommendations:  Problem # 1:  SYMPTOMATIC MENOPAUSAL/FEMALE CLIMACTERIC STATES (ICD-627.2)  Her updated medication list for this problem includes:    Estratest 1.25-2.5 Mg Tabs (Est estrogens-methyltest) .Marland KitchenMarland KitchenMarland KitchenMarland Kitchen  One by mouth daily Discussed treatment options.   added pristiq may need estrogen and progesteron The weight gain begain with the estratest! Will try samples  Problem # 2:  MIGRAINE HEADACHE (ICD-346.90) Headache diary reviewed. I feel that the progesterone component is mediating the migraines  Problem # 3:  WEIGHT GAIN (ICD-783.1) weight gain due to the extrotest and the testosterone shots opitfast or slim fast   Complete Medication List: 1)  Ativan 0.5 Mg Tabs (Lorazepam) .... One tab three times a day 2)  Estratest 1.25-2.5 Mg Tabs (Est estrogens-methyltest) .... One by mouth daily 3)  Pristiq 50 Mg Tb24 (Desvenlafaxine succinate) .... One by mouth daily   Patient Instructions: 1)  Please schedule a follow-up appointment in 1 month.    Prescriptions: ATIVAN 0.5 MG  TABS (LORAZEPAM) one tab three times a day  #90 x 1   Entered and Authorized by:   Stacie Glaze MD   Signed by:   Stacie Glaze MD on 03/22/2008   Method used:   Print then Give to Patient   RxID:   [id number]  ]

## 2011-01-23 NOTE — Procedures (Signed)
Summary: EGD Report/[facility]  EGD Report/   Imported By: Maryln Gottron 05/24/2010 15:59:27  _____________________________________________________________________  External Attachment:    Type:   Image     Comment:   External Document

## 2011-01-23 NOTE — Assessment & Plan Note (Signed)
Summary: 6 wk rov/njr   Vital Signs:  Patient Profile:   45 Years Old Female Height:     67 inches Weight:      209 pounds Temp:     98.6 degrees F oral Resp:     16 per minute BP sitting:   148 / 85  Pt. in pain?   no                  Chief Complaint:  Multiple medical problems or concerns.  History of Present Illness:  Follow-Up Visit      This is a 45 year old woman who presents for Follow-up visit.  for mirapex for restless legs with good results form the .25 dose .  The patient denies chest pain, palpitations, dizziness, syncope, low blood sugar symptoms, high blood sugar symptoms, edema, SOB, DOE, PND, and orthopnea.  Since the last visit the patient notes no new problems or concerns.  The patient reports taking meds as prescribed and dietary compliance.  When questioned about possible medication side effects, the patient notes none.      Prior Medication List:  ATIVAN 0.5 MG  TABS (LORAZEPAM) one tab three times a day PRISTIQ 50 MG  TB24 (DESVENLAFAXINE SUCCINATE) one by mouth daily COMBIPATCH 0.05-0.14 MG/DAY  PTTW (ESTRADIOL-NORETHINDRONE ACET) apply to skin twice weekly LUNESTA 3 MG TABS (ESZOPICLONE) 1 at bedtime as needed MIRAPEX 0.25 MG TABS (PRAMIPEXOLE DIHYDROCHLORIDE) one by mouth daily   Current Allergies (reviewed today): * IVP DYE  Past Medical History:    Reviewed history from 09/29/2007 and no changes required:       UTIs       Nephrolithiasis, hx of  Past Surgical History:    Reviewed history from 05/15/2008 and no changes required:       Colonoscopy       Cholecystectomy       herniorrhaphy       Laparoscopy       tubal ligation   Family History:    Reviewed history from 09/29/2007 and no changes required:       Family History Breast cancer 1st degree relative <50       Family History of Cardiovascular disorder  Social History:    Reviewed history from 09/29/2007 and no changes required:       Occupation:       Married  Current Smoker       Alcohol use-no       Drug use-no    Review of Systems  The patient denies anorexia, fever, weight loss, weight gain, vision loss, decreased hearing, hoarseness, chest pain, syncope, dyspnea on exertion, peripheral edema, prolonged cough, headaches, hemoptysis, abdominal pain, melena, hematochezia, severe indigestion/heartburn, hematuria, incontinence, genital sores, muscle weakness, suspicious skin lesions, transient blindness, difficulty walking, depression, unusual weight change, abnormal bleeding, enlarged lymph nodes, angioedema, and breast masses.     Physical Exam  General:     alert and overweight-appearing.   Eyes:     pupils equal and pupils round.   Ears:     R ear normal and L ear normal.   Nose:     no external deformity and no nasal discharge.   Mouth:     good dentition and pharynx pink and moist.   Neck:     supple and no masses.   Lungs:     normal respiratory effort.   Heart:     normal rate, regular rhythm, no murmur, and no gallop.  Abdomen:     soft, normal bowel sounds, no masses, and no guarding.   Msk:     No deformity or scoliosis noted of thoracic or lumbar spine.   Pulses:     R and L carotid,radial,femoral,dorsalis pedis and posterior tibial pulses are full and equal bilaterally Extremities:     No clubbing, cyanosis, edema, or deformity noted with normal full range of motion of all joints.   Neurologic:     No cranial nerve deficits noted. Station and gait are normal. Plantar reflexes are down-going bilaterally. DTRs are symmetrical throughout. Sensory, motor and coordinative functions appear intact. Skin:     Intact without suspicious lesions or rashes    Impression & Recommendations:  Problem # 1:  MIGRAINE HEADACHE (ICD-346.90) Assessment: Unchanged Headache diary reviewed.   Problem # 2:  RESTLESS LEG SYNDROME (ICD-333.94) Assessment: Improved on the mirapex .25  Problem # 3:  TRANSIENT DISORDER  INITIATING/MAINTAINING SLEEP (ICD-307.41) the [place] of ativan Current Meds:  ATIVAN 0.5 MG  TABS (LORAZEPAM) one tab three times a day PRISTIQ 50 MG  TB24 (DESVENLAFAXINE SUCCINATE) one by mouth daily COMBIPATCH 0.05-0.14 MG/DAY  PTTW (ESTRADIOL-NORETHINDRONE ACET) apply to skin twice weekly LUNESTA 3 MG TABS (ESZOPICLONE) 1 at bedtime as needed MIRAPEX 0.25 MG TABS (PRAMIPEXOLE DIHYDROCHLORIDE) one by mouth daily    Medications Added to Medication List This Visit: 1)  Ativan 0.5 Mg Tabs (Lorazepam) .... One tab bid 2)  Mirapex 0.25 Mg Tabs (Pramipexole dihydrochloride) .... One by mouth daily  Complete Medication List: 1)  Ativan 0.5 Mg Tabs (Lorazepam) .... One tab bid 2)  Pristiq 50 Mg Tb24 (Desvenlafaxine succinate) .... One by mouth daily 3)  Combipatch 0.05-0.14 Mg/day Pttw (Estradiol-norethindrone acet) .... Apply to skin twice weekly 4)  Lunesta 3 Mg Tabs (Eszopiclone) .Marland Kitchen.. 1 at bedtime as needed 5)  Mirapex 0.25 Mg Tabs (Pramipexole dihydrochloride) .... One by mouth daily   Patient Instructions: 1)  decrease the ativan to two times a day 2)  or 3-4 weeks then try to use it only at bed time 3)  Please schedule a follow-up appointment in 2 months.   Prescriptions: ATIVAN 0.5 MG  TABS (LORAZEPAM) one tab BID  #60 x 2   Entered and Authorized by:   Stacie Glaze MD   Signed by:   Stacie Glaze MD on 10/18/2008   Method used:   Print then Give to Patient   RxID:   [id number] MIRAPEX 0.25 MG TABS (PRAMIPEXOLE DIHYDROCHLORIDE) one by mouth daily  #30 x 3   Entered and Authorized by:   Stacie Glaze MD   Signed by:   Stacie Glaze MD on 10/18/2008   Method used:   Electronically to        [facility] [address] [id number]* (retail)       [address] [place], [place]  [address]       Ph: [phone]       Fax: [phone]   RxID:   [id number] MIRAPEX 0.25 MG TABS (PRAMIPEXOLE DIHYDROCHLORIDE) one by mouth daily  #90 x 3   Entered and Authorized by:    Stacie Glaze MD   Signed by:   Stacie Glaze MD on 10/18/2008   Method used:   Print then Give to Patient   RxID:   [id number]  ]

## 2011-01-23 NOTE — Letter (Signed)
Summary: [facility]  [facility]   Imported By: Maryln Gottron 04/01/2009 15:35:28  _____________________________________________________________________  External Attachment:    Type:   Image     Comment:   External Document

## 2011-01-23 NOTE — Progress Notes (Signed)
Summary: "Crushing" headache sx  Phone Note Call from Patient Call back at Work Phone [phone]   Caller: Patient Call For: Lovell Sheehan Summary of Call: Pt called to report she has a CPX scheduled for 3/6.  She has weaned off Lexapro with the assistance of her OB/GYN on 2/11 and started Wellbutrin on 2/20.  A "crushing" headache started on 2/21 and she cannot get rid of it with OTC Tylenol or Ibuprofen.  Pt requesting assistance with this headache. [facility] [address] Initial call taken by: Sid Falcon LPN,  February 19, 2008 10:39 AM  Follow-up for Phone Call        STOP the welbutrin... most obvous cause..... this is a reported side effect call back if needs something for mood use temporarily ativan .5 mg three times a day as needed number 60 Follow-up by: Stacie Glaze MD,  February 19, 2008 1:23 PM  Additional Follow-up for Phone Call Additional follow up Details #1::        Rx sent electronically, pt informed of Dr Lovell Sheehan message and she voiced her understanding. Additional Follow-up by: Sid Falcon LPN,  February 19, 2008 2:34 PM    New/Updated Medications: ATIVAN 0.5 MG  TABS (LORAZEPAM) one tab three times a day   Prescriptions: ATIVAN 0.5 MG  TABS (LORAZEPAM) one tab three times a day  #60 x 0   Entered by:   Sid Falcon LPN   Authorized by:   Stacie Glaze MD   Signed by:   Sid Falcon LPN on 91/47/8295   Method used:   Electronically sent to ...       [facility] [address] [facility] [id number]*       [address]       [place], [place]  [address]       Ph: [phone]       Fax: [phone]   RxID:   [id number]     Appended Document: "Crushing" headache sx Rx Ativan needed to be called in, this was done to pt pharmacy.

## 2011-01-23 NOTE — Progress Notes (Signed)
Summary: referral  Phone Note Call from Patient   Caller: Spouse Call For: Brittany Glaze MD Reason for Call: Refill Medication, Referral Summary of Call: Pt husband called ---- she was recently put on seroquel and adderall and she had been crazy on it.  They have done nothing but fight since she has been on the medication.Marland Kitchen   He said he is about to walk out and leave her sitting alone.  He feels like she is losing it.  She can stop the medication----He said he may take her to [facility]--- for beh health eval.    Initial call taken by: [name] DO,  June 11, 2009 9:56 PM  Follow-up for Phone Call        follow up to see if she went to Behavioral health over the week end if not refer to psychciatrist asap Follow-up by: Brittany Glaze MD,  June 13, 2009 5:28 AM  Additional Follow-up for Phone Call Additional follow up Details #1::        Notified pt and husband, and he does not seem receptive to a pyschiatric evaluation.Marland Kitchen  He feels her hormones need to be manipulated.  Informed him that Dr. Lovell Sheehan wants  to start with pyschiatry.  He says she was resting, and he would speak to me. Additional Follow-up by: [name] CMA,  June 13, 2009 8:19 AM    Additional Follow-up for Phone Call Additional follow up Details #2::    [name] called back to confirm she can stop her Seroquel and Adderal. Follow-up by: [name] CMA,  June 13, 2009 10:20 AM  Additional Follow-up for Phone Call Additional follow up Details #3:: Details for Additional Follow-up Action Taken: all info given to pt and she is going to call dr Nolen Mu and make her appointment-as per psychiatric protocol-dr [name] is aware Additional Follow-up by: [name], LPN,  June 13, 2009 4:41 PM

## 2011-01-23 NOTE — Assessment & Plan Note (Signed)
Summary: cpx/jls/PT RESCD/CCM   Vital Signs:  Patient Profile:   45 Years Old Female Height:     67 inches Weight:      210 pounds Temp:     98.2 degrees F oral Pulse rate:   76 / minute Resp:     14 per minute BP sitting:   154 / 90  (left arm)  Vitals Entered By: [name], LPN (February 25, 1609 2:48 PM)                 Chief Complaint:  cpx/dt today/colonosocopy 2008/pap and breast exam by dr Edward Jolly yearly/ c/o frequent headaches.  History of Present Illness: presents for CPX but hahas a list of promblems to discuss  1. new pattern of HA following TAH  was on estradiol patches and had a panic episode so was converted to pill. PMS symptoms and was started on estratest... then added the lexapro... then begaon to have weight gain... as weaned of the lexapro   and hot flashes startes.... was but on welbutrin and could not tolerate HA is worse with orgasm.  2. the abdominal pain has stopped 3     Current Allergies: * IVP DYE  Past Medical History:    Reviewed history from 09/29/2007 and no changes required:       UTIs       Nephrolithiasis, hx of  Past Surgical History:    Reviewed history from 09/29/2007 and no changes required:       Colonoscopy       Cholecystectomy       herniorrhaphy       Laparoscopy   Family History:    Reviewed history from 09/29/2007 and no changes required:       Family History Breast cancer 1st degree relative <50       Family History of Cardiovascular disorder  Social History:    Reviewed history from 09/29/2007 and no changes required:       Occupation:       Married       Current Smoker       Alcohol use-no       Drug use-no    Review of Systems       The patient complains of weight gain, vision loss, decreased hearing, hoarseness, syncope, muscle weakness, suspicious skin lesions, transient blindness, difficulty walking, depression, unusual weight change, abnormal bleeding, and enlarged lymph nodes.  The patient  denies anorexia, fever, weight loss, chest pain, dyspnea on exhertion, peripheral edema, prolonged cough, hemoptysis, abdominal pain, melena, hematochezia, and severe indigestion/heartburn.     Physical Exam  General:     alert and overweight-appearing.   Head:     normocephalic and atraumatic.   Eyes:     pupils equal and pupils round.   Ears:     R ear normal and L ear normal.   Nose:     no external deformity and no nasal discharge.   Mouth:     good dentition and pharynx pink and moist.   Neck:     supple and no masses.   Chest Wall:     no deformities.   Lungs:     normal respiratory effort.   Heart:     normal rate, regular rhythm, no murmur, and no gallop.   Abdomen:     soft, normal bowel sounds, no masses, and no guarding.   Msk:     No deformity or scoliosis noted of thoracic or  lumbar spine.   Pulses:     R and L carotid,radial,femoral,dorsalis pedis and posterior tibial pulses are full and equal bilaterally Extremities:     No clubbing, cyanosis, edema, or deformity noted with normal full range of motion of all joints.   Neurologic:     No cranial nerve deficits noted. Station and gait are normal. Plantar reflexes are down-going bilaterally. DTRs are symmetrical throughout. Sensory, motor and coordinative functions appear intact.    Impression & Recommendations:  Problem # 1:  MIGRAINE HEADACHE (ICD-346.90) mentrual migraines with climacteric state.. s/p TAH was put on HRT estrogen alone then estratest weight gain sex drive lost SSI worked but resulted in [employer] diary reviewed. trial of pristiq consider patch is fails  Problem # 2:  PREVENTIVE HEALTH CARE (ICD-V70.0) reveiwed labs and immunization  Problem # 3:  SYMPTOMATIC MENOPAUSAL/FEMALE CLIMACTERIC STATES (ICD-627.2)  Her updated medication list for this problem includes:    Estratest 1.25-2.5 Mg Tabs (Est estrogens-methyltest) ..... One by mouth daily Discussed treatment options.     Medications Added to Medication List This Visit: 1)  Estratest 1.25-2.5 Mg Tabs (Est estrogens-methyltest) .... Unsure of dosage-once daily 2)  Estratest 1.25-2.5 Mg Tabs (Est estrogens-methyltest) .... One by mouth daily 3)  Pristiq 50 Mg Tb24 (Desvenlafaxine succinate) .... One by mouth daily  Complete Medication List: 1)  Ativan 0.5 Mg Tabs (Lorazepam) .... One tab three times a day 2)  Estratest 1.25-2.5 Mg Tabs (Est estrogens-methyltest) .... One by mouth daily 3)  Pristiq 50 Mg Tb24 (Desvenlafaxine succinate) .... One by mouth daily  Other Orders: Tdap => 92yrs IM ([address]) Admin 1st Vaccine (60454)   Patient Instructions: 1)  will start pristiq one a day 2)  if the mood and hot flashes egualize we wll stp there but idf the mood and heaaches still occur we will need to start estrosgen and p0rgesteron  ( the combipatch) 3)  Please schedule a follow-up appointment in 3-4weeks.    ]  Tetanus/Td Vaccine    Vaccine Type: Tdap    Site: left deltoid    Mfr: Sanofi Pasteur    Dose: 0.5 ml    Route: IM    Given by: [name], LPN    Exp. Date: 01/29/2010    Lot #: [id number]

## 2011-01-23 NOTE — Assessment & Plan Note (Signed)
Summary: f/u from er over th weekend- boil on backl   Vital Signs:  Patient profile:   45 year old female Height:      67 inches Weight:      238 pounds BMI:     37.41 Temp:     98.1 degrees F oral Pulse rate:   80 / minute Resp:     14 per minute BP sitting:   130 / 84  (left arm)  Vitals Entered By: [name], LPN (Apr 26, 1609 2:56 PM)  CC:  to er over weekend with boil on shoulder--i&d in er and doxycyline given.  History of Present Illness: Had a boil and went to the hopsital at [facility] and the did an I and D of the site and instructed her to come back for recheck of site cultures in the ER are negative for MRSA  the pain in the flank has oimproved but the CT only showed the stone in the pelvic of the kiney and she has intermintant hematuria  Allergies: 1)  * Ivp Dye  Physical Exam  General:  Well-developed,well-nourished,in no acute distress; alert,appropriate and cooperative throughout examination Head:  Normocephalic and atraumatic without obvious abnormalities. No apparent alopecia or balding. Eyes:  No corneal or conjunctival inflammation noted. EOMI. Perrla. Funduscopic exam benign, without hemorrhages, exudates or papilledema. Vision grossly normal. Nose:  no external deformity and no nasal discharge.   Mouth:  good dentition and pharynx pink and moist.   Neck:  supple and no masses.   Lungs:  normal respiratory effort.   Heart:  normal rate, regular rhythm, no murmur, and no gallop.   Skin:  inflamaton over 4 cm radius with i cm surgical incision purulent materials easily extracted with minimal pressure prepped for reexcision and I and D   Impression & Recommendations:  Problem # 1:  SEBACEOUS CYST, INFECTED (ICD-706.2) cyst was openned in the ER but was not probed and remains full of purulent materials we used loca anesthesia with 2 % with epi and opened the site to 2 cm the site was probed and lavaged with saline to clear the site was packed  with 10 cm of iodinated gause new antibiotic and would care instructions were given Orders: No Charge Patient Arrived (NCPA0) (NCPA0) I&D Abscess, Complex (10061)  Complete Medication List: 1)  Ativan 0.5 Mg Tabs (Lorazepam) .... One tab once daily 2)  Pristiq 50 Mg Tb24 (Desvenlafaxine succinate) .... One by mouth daily 3)  Combipatch 0.05-0.14 Mg/day Pttw (Estradiol-norethindrone acet) 4)  Mirapex 0.25 Mg Tabs (Pramipexole dihydrochloride) .... One by mouth daily 5)  Metronidazole 0.75 % Gel (Metronidazole) .... Apply to face bid 6)  Phenazopyridine Hcl 100 Mg Tabs (Phenazopyridine hcl) .... One by mouth q 4 hours as needed flank pain 7)  Augmentin 875-125 Mg Tabs (Amoxicillin-pot clavulanate) .... One by mouth two times a day for 7 days 8)  Vicodin 5-500 Mg Tabs (Hydrocodone-acetaminophen) .... One by mouth q 6 hours as needed pain  Patient Instructions: 1)  take the pristiq every other day for ten days the stop  Prescriptions: VICODIN 5-500 MG TABS (HYDROCODONE-ACETAMINOPHEN) one by mouth q 6 hours as needed pain  #20 x 0   Entered and Authorized by:   Stacie Glaze MD   Signed by:   Stacie Glaze MD on 04/26/2009   Method used:   Print then Give to Patient   RxID:   [id number] AUGMENTIN 875-125 MG TABS (AMOXICILLIN-POT CLAVULANATE) one by  mouth two times a day for 7 days  #14 x 0   Entered and Authorized by:   Stacie Glaze MD   Signed by:   Stacie Glaze MD on 04/26/2009   Method used:   Electronically to        [facility] [address] [id number]* (retail)       [address] [place], [place]  [address]       Ph: [phone]       Fax: [phone]   RxID:   [id number]

## 2011-01-23 NOTE — Assessment & Plan Note (Signed)
Summary: follow up/mhf   Vital Signs:  Patient Profile:   45 Years Old Female Height:     67 inches Weight:      214 pounds Temp:     98.4 degrees F oral Pulse rate:   76 / minute Resp:     14 per minute BP sitting:   140 / 90  (left arm)  Vitals Entered By: [name], LPN (December 29, 2008 11:40 AM)                 Chief Complaint:  roa med review--c/o red area from patches of combipatch that appears to stay too long on skin .  History of Present Illness: having reaction to the combipatches ( localized skin)  the night sweats and hot flashes have resolved the HA have signifcantly lessened The presuptive diagnosis is menopause with a moderate menopausal mood disorder    Prior Medication List:  ATIVAN 0.5 MG  TABS (LORAZEPAM) one tab BID PRISTIQ 50 MG  TB24 (DESVENLAFAXINE SUCCINATE) one by mouth daily COMBIPATCH 0.05-0.14 MG/DAY  PTTW (ESTRADIOL-NORETHINDRONE ACET) apply to skin twice weekly LUNESTA 3 MG TABS (ESZOPICLONE) 1 at bedtime as needed MIRAPEX 0.25 MG TABS (PRAMIPEXOLE DIHYDROCHLORIDE) one by mouth daily FLEXERIL 10 MG TABS (CYCLOBENZAPRINE HCL) three times a day as needed spasm   Current Allergies (reviewed today): * IVP DYE  Past Medical History:    Reviewed history from 09/29/2007 and no changes required:       UTIs       Nephrolithiasis, hx of  Past Surgical History:    Reviewed history from 05/15/2008 and no changes required:       Colonoscopy       Cholecystectomy       herniorrhaphy       Laparoscopy       tubal ligation   Family History:    Reviewed history from 09/29/2007 and no changes required:       Family History Breast cancer 1st degree relative <50       Family History of Cardiovascular disorder  Social History:    Reviewed history from 09/29/2007 and no changes required:       Occupation:       Married       Current Smoker       Alcohol use-no       Drug use-no   Risk Factors:    Review of Systems  The  patient denies anorexia, fever, weight loss, weight gain, vision loss, decreased hearing, hoarseness, chest pain, syncope, dyspnea on exertion, peripheral edema, prolonged cough, headaches, hemoptysis, abdominal pain, melena, hematochezia, severe indigestion/heartburn, hematuria, incontinence, genital sores, muscle weakness, suspicious skin lesions, transient blindness, difficulty walking, depression, unusual weight change, abnormal bleeding, enlarged lymph nodes, angioedema, breast masses, and testicular masses.     Physical Exam  General:     Well-developed,well-nourished,in no acute distress; alert,appropriate and cooperative throughout examination Eyes:     No corneal or conjunctival inflammation noted. EOMI. Perrla. Funduscopic exam benign, without hemorrhages, exudates or papilledema. Vision grossly normal. Ears:     R ear normal and L ear normal.   Mouth:     good dentition and pharynx pink and moist.   Neck:     supple and no masses.   Lungs:     normal respiratory effort.   Heart:     normal rate, regular rhythm, no murmur, and no gallop.   Abdomen:     soft, normal bowel sounds,  no masses, and no guarding.      Impression & Recommendations:  Problem # 1:  RESTLESS LEG SYNDROME (ICD-333.94) mirapex trial continues with samples  Problem # 2:  TRANSIENT DISORDER INITIATING/MAINTAINING SLEEP (ICD-307.41) due to restless leg  Problem # 3:  SYMPTOMATIC MENOPAUSAL/FEMALE CLIMACTERIC STATES (ICD-627.2) change patch due to allergi reaction t the glue Her updated medication list for this problem includes:    Climara Pro 0.045-0.015 Mg/day Ptwk (Estradiol-levonorgestrel) ..... Once patch to skin  weekly Discussed treatment options.   Problem # 4:  DERMATITIS, ATOPIC (ICD-691.8) due to glue in patch Her updated medication list for this problem includes:    Diprolene 0.05 % Lotn (Aug betamethasone dipropionate) .Marland Kitchen... Apply a small amount to the rash daily Discussed use of  medication and avoidance of irritating agents. Also stressed importance of moisturizers.   Complete Medication List: 1)  Ativan 0.5 Mg Tabs (Lorazepam) .... One tab once daily 2)  Pristiq 50 Mg Tb24 (Desvenlafaxine succinate) .... One by mouth daily 3)  Climara Pro 0.045-0.015 Mg/day Ptwk (Estradiol-levonorgestrel) .... Once patch to skin  weekly 4)  Lunesta 3 Mg Tabs (Eszopiclone) .Marland Kitchen.. 1 at bedtime as needed 5)  Mirapex 0.25 Mg Tabs (Pramipexole dihydrochloride) .... One by mouth daily 6)  Flexeril 10 Mg Tabs (Cyclobenzaprine hcl) .... Three times a day as needed spasm 7)  Diprolene 0.05 % Lotn (Aug betamethasone dipropionate) .... Apply a small amount to the rash daily   Patient Instructions: 1)  Please schedule a follow-up appointment in 2 months.   Prescriptions: DIPROLENE 0.05 % LOTN (AUG BETAMETHASONE DIPROPIONATE) apply a small amount to the rash daily  #30cc x 11   Entered and Authorized by:   Stacie Glaze MD   Signed by:   Stacie Glaze MD on 12/29/2008   Method used:   Electronically to        [facility] [address] #[id number]* (retail)       [address] [place], [place]  [address]       Ph: [phone]       Fax: [phone]   RxID:   [id number] CLIMARA PRO 0.045-0.015 MG/DAY PTWK (ESTRADIOL-LEVONORGESTREL) once patch to skin  weekly  #5 x 11   Entered and Authorized by:   Stacie Glaze MD   Signed by:   Stacie Glaze MD on 12/29/2008   Method used:   Electronically to        [facility] [address] #[id number]* (retail)       [address] [place], [place]  [address]       Ph: [phone]       Fax: [phone]   RxID:   [id number]  ]

## 2011-01-23 NOTE — Assessment & Plan Note (Signed)
Summary: 1 wk rov/mm   Vital Signs:  Patient profile:   45 year old [name] Height:      67 inches Weight:      226 pounds BMI:     35.52 Temp:     98.0 degrees F oral Pulse rate:   76 / minute Resp:     14 per minute BP sitting:   140 / 80  (left arm)  Vitals Entered By: [name], LPN (May 03, 2009 2:44 PM)  CC:  roa- I&D of cyst on shoulder.  History of Present Illness: CYST IS HEALING WELL THE PT HAS EXPERIENCED A WITHDRAWL SYNDROME FROM THE PRISTIQ tear full and anxous can't sleep weigth gain wants off the medicstions but when she skips a dose she has emotional "episodes" "my husband thinks I am crazy"  Allergies: 1)  ! Pristiq (Desvenlafaxine Succinate) 2)  * Ivp Dye  Past History:  Family History:    Family History Breast cancer 1st degree relative <50    Family History of Cardiovascular disorder     (09/29/2007)  Social History:    Occupation:    Married    Current Smoker    Alcohol use-no    Drug use-no     (09/29/2007)  Risk Factors:    Alcohol Use: N/A    >5 drinks/d w/in last 3 months: N/A    Caffeine Use: N/A    Diet: N/A    Exercise: N/A  Risk Factors:    Smoking Status: current (09/29/2007)    Packs/Day: 1 (12/29/2008)    Cigars/wk: N/A    Pipe Use/wk: N/A    Cans of tobacco/wk: N/A    Passive Smoke Exposure: N/A  Past medical, surgical, family and social histories (including risk factors) reviewed, and no changes noted (except as noted below).  Past Medical History:    Reviewed history from 09/29/2007 and no changes required:    UTIs    Nephrolithiasis, hx of  Past Surgical History:    Reviewed history from 05/15/2008 and no changes required:    Colonoscopy    Cholecystectomy    herniorrhaphy    Laparoscopy    tubal ligation  Family History:    Reviewed history from 09/29/2007 and no changes required:       Family History Breast cancer 1st degree relative <50       Family History of Cardiovascular disorder  Social  History:    Reviewed history from 09/29/2007 and no changes required:       Occupation:       Married       Current Smoker       Alcohol use-no       Drug use-no  Review of Systems  The patient denies anorexia, fever, weight loss, weight gain, vision loss, decreased hearing, hoarseness, chest pain, syncope, dyspnea on exertion, peripheral edema, prolonged cough, headaches, hemoptysis, abdominal pain, melena, hematochezia, severe indigestion/heartburn, hematuria, incontinence, genital sores, muscle weakness, suspicious skin lesions, transient blindness, difficulty walking, depression, unusual weight change, abnormal bleeding, enlarged lymph nodes, angioedema, and breast masses.    Physical Exam  General:  Well-developed,well-nourished,in no acute distress; alert,appropriate and cooperative throughout examination Head:  Normocephalic and atraumatic without obvious abnormalities. No apparent alopecia or balding. Eyes:  No corneal or conjunctival inflammation noted. EOMI. Perrla. Funduscopic exam benign, without hemorrhages, exudates or papilledema. Vision grossly normal. Ears:  R ear normal and L ear normal.   Nose:  no external deformity and no nasal discharge.  Mouth:  good dentition and pharynx pink and moist.   Neck:  supple and no masses.   Lungs:  normal respiratory effort.   Heart:  normal rate, regular rhythm, no murmur, and no gallop.   Abdomen:  normal bowel sounds, no distention, LUQ tenderness, and L flank tenderness.   Msk:  tender with spasm along the posterior neck and between the shoulder blades. ROM of the neck is quite limited.  Pulses:  R and L carotid,radial,femoral,dorsalis pedis and posterior tibial pulses are full and equal bilaterally Neurologic:  alert & oriented X3, cranial nerves II-XII intact, strength normal in all extremities, and gait normal.     Impression & Recommendations:  Problem # 1:  SYMPTOMATIC MENOPAUSAL/[name] CLIMACTERIC STATES (ICD-627.2)  Her  updated medication list for this problem includes:    Combipatch 0.05-0.14 Mg/day Pttw (Estradiol-norethindrone acet)  Discussed treatment options.   Problem # 2:  DRUG WITHDRAWAL (ICD-292.0)  she has difficulty with the withdrawl from pristiq will increased the ativan Her updated medication list for this problem includes:    Vicodin 5-500 Mg Tabs (Hydrocodone-acetaminophen) ..... One by mouth q 6 hours as needed pain  Headache diary reviewed.  Complete Medication List: 1)  Ativan 0.5 Mg Tabs (Lorazepam) .... One tab  by mouth three times a day or as directed 2)  Combipatch 0.05-0.14 Mg/day Pttw (Estradiol-norethindrone acet) 3)  Mirapex 0.25 Mg Tabs (Pramipexole dihydrochloride) .... One by mouth daily 4)  Metronidazole 0.Brittany % Gel (Metronidazole) .... Apply to face bid 5)  Phenazopyridine Hcl 100 Mg Tabs (Phenazopyridine hcl) .... One by mouth q 4 hours as needed flank pain 6)  Vicodin 5-500 Mg Tabs (Hydrocodone-acetaminophen) .... One by mouth q 6 hours as needed pain  Patient Instructions: 1)  Please schedule a follow-up appointment in 3 weeks. 2)  no more pristiq 3)  take ativan scheduled three times a day 4)  for the next 5 days the go back one at night Prescriptions: ATIVAN 0.5 MG  TABS (LORAZEPAM) one tab  by mouth three times a day OR AS DIRECTED  #90 x 0   Entered and Authorized by:   Stacie Glaze MD   Signed by:   Stacie Glaze MD on 05/03/2009   Method used:   Print then Give to Patient   RxID:   [id number]
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
## 2011-01-23 NOTE — Progress Notes (Signed)
Summary: Ativan refill request  Phone Note Call from Patient Call back at [phone] (cell)  msg OK   Caller: Patient Call For: Lovell Sheehan Summary of Call: Pt calling to request Rx for Ativan, has 1 pill left and was instructed to stay on all meds until F/U visit on 3/30. Ativan 0.5 mg, pt taking one three times a day [facility] [address] Initial call taken by: Sid Falcon LPN,  March 10, 2008 9:49 AM      Prescriptions: ATIVAN 0.5 MG  TABS (LORAZEPAM) one tab three times a day  #90 x 0   Entered by:   [name], LPN   Authorized by:   Stacie Glaze MD   Signed by:   [name], LPN on 69/62/9528   Method used:   Telephoned to ...       [id number]*       [address]       [place], [place]  [address]       Ph: [phone]       Fax: [phone]   RxID:   [id number]     Appended Document: Ativan refill request pt informed

## 2011-01-23 NOTE — Assessment & Plan Note (Signed)
Summary: increasing abdominal pain/dm   Vital Signs:  Patient profile:   45 year old female Temp:     98.2 degrees F oral Pulse rate:   80 / minute Resp:     14 per minute BP sitting:   110 / 76  (left arm)  Vitals Entered By: [name], LPN (March 30, 1609 4:11 PM)  CC:  c/o left inginal painl.  History of Present Illness: increased pain in LLQ and CT with stone in renal pelvis but no obstruction seen the queston of diverticulosis/diverticulitis arose with the urologist from the CT but not on the report the pts pain is more chronic wthout fever or chill and the pain did not respond to cipro on exam she is tender in the LLQ and the urinalysis showes persistant 2 + hematuria  Problems Prior to Update: 1)  Flank Pain  (ICD-789.09) 2)  Hematuria Unspecified  (ICD-599.70) 3)  Dermatitis, Atopic  (ICD-691.8) 4)  Neck Sprain and Strain  (ICD-847.0) 5)  Restless Leg Syndrome  (ICD-333.94) 6)  Transient Disorder Initiating/maintaining Sleep  (ICD-307.41) 7)  Hemorrhoids, Internal  (ICD-455.0) 8)  Colonic Polyps, Hyperplastic  (ICD-211.3) 9)  Umbilical Hernia  (ICD-553.1) 10)  Weight Gain  (ICD-783.1) 11)  Symptomatic Menopausal/female Climacteric States  (ICD-627.2) 12)  Migraine Headache  (ICD-346.90) 13)  Preventive Health Care  (ICD-V70.0) 14)  Family History Breast Cancer 1st Degree Relative <50  (ICD-V16.3) 15)  Nephrolithiasis, Hx of  (ICD-V13.01)  Medications Prior to Update: 1)  Ativan 0.5 Mg  Tabs (Lorazepam) .... One Tab Once Daily 2)  Pristiq 50 Mg  Tb24 (Desvenlafaxine Succinate) .... One By Mouth Daily 3)  Activella 0.5-0.1 Mg Tabs (Estradiol-Norethindrone Acet) .... Use As Directed 4)  Mirapex 0.25 Mg Tabs (Pramipexole Dihydrochloride) .... One By Mouth Daily 5)  Metronidazole 0.75 % Gel (Metronidazole) .... Apply To Face Bid 6)  Ciprofloxacin Hcl 250 Mg Tabs (Ciprofloxacin Hcl) .... One By Mouth Bid 7)  Phenazopyridine Hcl 100 Mg Tabs (Phenazopyridine Hcl)  .... One By Mouth Q 4 Hours As Needed Flank Pain  Current Medications (verified): 1)  Ativan 0.5 Mg  Tabs (Lorazepam) .... One Tab Once Daily 2)  Pristiq 50 Mg  Tb24 (Desvenlafaxine Succinate) .... One By Mouth Daily 3)  Activella 0.5-0.1 Mg Tabs (Estradiol-Norethindrone Acet) .... Use As Directed 4)  Mirapex 0.25 Mg Tabs (Pramipexole Dihydrochloride) .... One By Mouth Daily 5)  Metronidazole 0.75 % Gel (Metronidazole) .... Apply To Face Bid 6)  Ciprofloxacin Hcl 250 Mg Tabs (Ciprofloxacin Hcl) .... One By Mouth Bid 7)  Phenazopyridine Hcl 100 Mg Tabs (Phenazopyridine Hcl) .... One By Mouth Q 4 Hours As Needed Flank Pain  Allergies (verified): 1)  * Ivp Dye  Past History:  Family History:    Family History Breast cancer 1st degree relative <50    Family History of Cardiovascular disorder     (09/29/2007)  Social History:    Occupation:    Married    Current Smoker    Alcohol use-no    Drug use-no     (09/29/2007)  Risk Factors:    Alcohol Use: N/A    >5 drinks/d w/in last 3 months: N/A    Caffeine Use: N/A    Diet: N/A    Exercise: N/A  Risk Factors:    Smoking Status: current (09/29/2007)    Packs/Day: 1 (12/29/2008)    Cigars/wk: N/A    Pipe Use/wk: N/A    Cans of tobacco/wk: N/A  Passive Smoke Exposure: N/A  Past medical, surgical, family and social histories (including risk factors) reviewed, and no changes noted (except as noted below).  Past Medical History:    Reviewed history from 09/29/2007 and no changes required:    UTIs    Nephrolithiasis, hx of  Past Surgical History:    Reviewed history from 05/15/2008 and no changes required:    Colonoscopy    Cholecystectomy    herniorrhaphy    Laparoscopy    tubal ligation  Family History:    Reviewed history from 09/29/2007 and no changes required:       Family History Breast cancer 1st degree relative <50       Family History of Cardiovascular disorder  Social History:    Reviewed history from  09/29/2007 and no changes required:       Occupation:       Married       Current Smoker       Alcohol use-no       Drug use-no  Review of Systems       The patient complains of abdominal pain and hematuria.  The patient denies anorexia, fever, weight loss, weight gain, vision loss, decreased hearing, hoarseness, chest pain, syncope, dyspnea on exertion, peripheral edema, prolonged cough, headaches, hemoptysis, melena, hematochezia, severe indigestion/heartburn, incontinence, genital sores, muscle weakness, suspicious skin lesions, transient blindness, difficulty walking, depression, unusual weight change, abnormal bleeding, enlarged lymph nodes, angioedema, breast masses, and testicular masses.    Physical Exam  General:  Well-developed,well-nourished,in no acute distress; alert,appropriate and cooperative throughout examination Head:  Normocephalic and atraumatic without obvious abnormalities. No apparent alopecia or balding. Ears:  R ear normal and L ear normal.   Nose:  no external deformity and no nasal discharge.   Mouth:  good dentition and pharynx pink and moist.   Neck:  supple and no masses.   Lungs:  normal respiratory effort.   Heart:  normal rate, regular rhythm, no murmur, and no gallop.   Abdomen:  normal bowel sounds, no distention, LUQ tenderness, and L flank tenderness.     Impression & Recommendations:  Problem # 1:  FLANK PAIN (ICD-789.09)  Discussed use of medications, application of heat or cold, and exercises.   Problem # 2:  ABDOMINAL PAIN, LEFT LOWER QUADRANT (ICD-789.04)  levoquin 750 for 10 days Discussed symptom control with the patient.   Orders: Venipuncture ([address]) TLB-CBC Platelet - w/Differential (85025-CBCD)  Problem # 3:  HEMATURIA UNSPECIFIED (ICD-599.70)  Orders: Venipuncture ([address]) TLB-CBC Platelet - w/Differential (85025-CBCD)  The following medications were removed from the medication list:    Ciprofloxacin Hcl 250 Mg Tabs  (Ciprofloxacin hcl) ..... One by mouth bid Her updated medication list for this problem includes:    Levaquin 750 Mg Tabs (Levofloxacin) ..... One by mouth daily  Discussed medication use and hematuria work-up.   Complete Medication List: 1)  Ativan 0.5 Mg Tabs (Lorazepam) .... One tab once daily 2)  Pristiq 50 Mg Tb24 (Desvenlafaxine succinate) .... One by mouth daily 3)  Activella 0.5-0.1 Mg Tabs (Estradiol-norethindrone acet) .... Use as directed 4)  Mirapex 0.25 Mg Tabs (Pramipexole dihydrochloride) .... One by mouth daily 5)  Metronidazole 0.75 % Gel (Metronidazole) .... Apply to face bid 6)  Phenazopyridine Hcl 100 Mg Tabs (Phenazopyridine hcl) .... One by mouth q 4 hours as needed flank pain 7)  Levaquin 750 Mg Tabs (Levofloxacin) .... One by mouth daily  Other Orders: UA Dipstick w/o Micro (automated)  (81003)  Laboratory  Results   Urine Tests    Routine Urinalysis   Color: yellow Appearance: Clear Glucose: negative   (Normal Range: Negative) Bilirubin: negative   (Normal Range: Negative) Ketone: negative   (Normal Range: Negative) Spec. Gravity: <1.005   (Normal Range: 1.003-1.035) Blood: 1+   (Normal Range: Negative) pH: 6.0   (Normal Range: 5.0-8.0) Protein: negative   (Normal Range: Negative) Urobilinogen: 0.2   (Normal Range: 0-1) Nitrite: negative   (Normal Range: Negative) Leukocyte Esterace: negative   (Normal Range: Negative)    Comments: Rita Ohara  March 30, 2009 4:22 PM

## 2011-01-23 NOTE — Progress Notes (Signed)
Summary: Phone note  Phone Note Call from Patient   Caller: Patient Summary of Call: Patient was here on Monday and had blood in her urine. Patient had a CT scan on 03/10/2009 and they found kidney stones.  Patient c/o alot of pressure and lower back pain that radiates down to her left pelvic area. Patient states she is urinating every hour and feels terrible. Patient can be reached at [phone] or [phone]. Initial call taken by: Darra Lis RMA,  March 23, 2009 1:14 PM  Follow-up for Phone Call        appointment with dr davis's pa at 9;15 in am and pt informed- already on cipro and has oxycontin for pain- pt informed of appointment Follow-up by: [name], LPN,  March 23, 2009 1:52 PM

## 2011-01-23 NOTE — Progress Notes (Signed)
Summary: not sleeping on samples  Phone Note Call from Patient Call back at [phone] [facility]   Caller: husband,adrian Call For: Sueo Cullen Reason for Call: Talk to Doctor Summary of Call: New problem not being able to sleep until 2am.  Could this be the new meds?  Combipatch(Estradiol & Norethindrone) sample & Pristiq 50 mg sample.  Need sleeping pill to [facility] [address].  Allergic to IVP dye.  Initial call taken by: Rudy Jew, RN,  May 04, 2008 2:56 PM  Follow-up for Phone Call        prostiq can temporarily cause insominia   may call in ambien 5mg  generic q HS number 30 Follow-up by: Stacie Glaze MD,  May 04, 2008 5:29 PM  Additional Follow-up for Phone Call Additional follow up Details #1::        pt in formed Additional Follow-up by: [name], LPN,  May 04, 2008 5:39 PM    New/Updated Medications: AMBIEN 5 MG  TABS (ZOLPIDEM TARTRATE) 1 at bedtime   Prescriptions: AMBIEN 5 MG  TABS (ZOLPIDEM TARTRATE) 1 at bedtime  #30 x 1   Entered by:   [name], LPN   Authorized by:   Stacie Glaze MD   Signed by:   [name], LPN on 45/40/9811   Method used:   Telephoned to ...       [id number]*       [address]       [place], [place]  [address]       Ph: [phone]       Fax: [phone]   RxID:   [id number]

## 2011-01-23 NOTE — Assessment & Plan Note (Signed)
Summary: 1 MONTH ROV/NJR   Vital Signs:  Patient Profile:   45 Years Old Female Height:     67 inches Weight:      212 pounds Temp:     98.2 degrees F oral Pulse rate:   76 / minute Resp:     14 per minute BP sitting:   132 / 82  (left arm)  Vitals Entered By: [name], LPN (April 19, 2008 12:12 PM)                 Chief Complaint:  [name] /f/u combi patch/has improved headaches.  History of Present Illness: Current Problems:  WEIGHT GAIN (ICD-783.1) SYMPTOMATIC MENOPAUSAL/FEMALE CLIMACTERIC STATES (ICD-627.2)  the combipatch have improved the headaches MIGRAINE HEADACHE (ICD-346.90)  improved no current HA hx!!!! PREVENTIVE HEALTH CARE (ICD-V70.0) FAMILY HISTORY BREAST CANCER 1ST DEGREE RELATIVE <50 (ICD-V16.3) NEPHROLITHIASIS, HX OF (ICD-V13.01)      Current Allergies: * IVP DYE  Past Medical History:    Reviewed history from 09/29/2007 and no changes required:       UTIs       Nephrolithiasis, hx of  Past Surgical History:    Reviewed history from 09/29/2007 and no changes required:       Colonoscopy       Cholecystectomy       herniorrhaphy       Laparoscopy     Review of Systems  The patient denies anorexia, fever, weight loss, weight gain, vision loss, decreased hearing, hoarseness, chest pain, syncope, dyspnea on exhertion, peripheral edema, prolonged cough, hemoptysis, abdominal pain, melena, hematochezia, severe indigestion/heartburn, hematuria, incontinence, genital sores, muscle weakness, suspicious skin lesions, transient blindness, difficulty walking, depression, unusual weight change, abnormal bleeding, enlarged lymph nodes, angioedema, breast masses, and testicular masses.     Physical Exam  General:     alert and overweight-appearing.   Head:     normocephalic and atraumatic.   Eyes:     pupils equal and pupils round.   Ears:     R ear normal and L ear normal.   Nose:     no external deformity and no nasal discharge.    Mouth:     good dentition and pharynx pink and moist.   Neck:     supple and no masses.   Lungs:     normal respiratory effort.   Heart:     normal rate, regular rhythm, no murmur, and no gallop.   Abdomen:     soft, normal bowel sounds, no masses, and no guarding.      Impression & Recommendations:  Problem # 1:  MIGRAINE HEADACHE (ICD-346.90) Headache diary reviewed.   Problem # 2:  SYMPTOMATIC MENOPAUSAL/FEMALE CLIMACTERIC STATES (ICD-627.2)  The following medications were removed from the medication list:    Estratest 1.25-2.5 Mg Tabs (Est estrogens-methyltest) ..... One by mouth daily  Her updated medication list for this problem includes:    Combipatch 0.05-0.14 Mg/day Pttw (Estradiol-norethindrone acet) .Marland Kitchen... Apply to skin twice weekly Discussed treatment options.   Complete Medication List: 1)  Ativan 0.5 Mg Tabs (Lorazepam) .... One tab three times a day 2)  Pristiq 50 Mg Tb24 (Desvenlafaxine succinate) .... One by mouth daily 3)  Combipatch 0.05-0.14 Mg/day Pttw (Estradiol-norethindrone acet) .... Apply to skin twice weekly   Patient Instructions: 1)  Please schedule a follow-up appointment in 2 months.    Prescriptions: COMBIPATCH 0.05-0.14 MG/DAY  PTTW (ESTRADIOL-NORETHINDRONE ACET) apply to skin twice weekly  #10 x 11  Entered and Authorized by:   Stacie Glaze MD   Signed by:   Stacie Glaze MD on 04/19/2008   Method used:   Electronically sent to ...       [id number]*       [address]       [place], [place]  [address]       Ph: [phone]       Fax: [phone]   RxID:   [id number]  ]

## 2011-01-23 NOTE — Progress Notes (Signed)
Summary: Pristique questions.  Phone Note Call from Patient   Summary of Call: Pt is still having crying spells, and nightmares since stopping the Pristique. [phone] Initial call taken by: [name] CMA,  May 05, 2009 1:00 PM  Follow-up for Phone Call        per dr Lovell Sheehan seroquel 25 mg at hs for 5 mights. Follow-up by: [name], LPN,  May 05, 2009 1:03 PM    New/Updated Medications: SEROQUEL 25 MG TABS (QUETIAPINE FUMARATE) one by mouth q hs x 5 nights   Prescriptions: SEROQUEL 25 MG TABS (QUETIAPINE FUMARATE) one by mouth q hs x 5 nights  #5 x 0   Entered by:   [name] CMA   Authorized by:   Stacie Glaze MD   Signed by:   [name] CMA on 05/05/2009   Method used:   Electronically to        [facility] #[id number]* (retail)       [address] [place], [place]  [address]       Ph: [phone]       Fax: [phone]   RxID:   [id number]  Pt notified.

## 2011-01-23 NOTE — Progress Notes (Signed)
Summary: Phone note  Phone Note Call from Patient   Reason for Call: Acute Illness Summary of Call: Patient c/o lower abd pain and states it feels like the same pains she was having about 2 weeks ago. Patient would like to be seen. She can be reached at [phone]. Initial call taken by: Darra Lis RMA,  April 14, 2009 10:38 AM  Follow-up for Phone Call        per dr Lovell Sheehan- send for repeat ct urogram- the original one was done in  er. pt informed Follow-up by: [name], LPN,  April 14, 2009 12:47 PM

## 2011-01-23 NOTE — Assessment & Plan Note (Signed)
Summary: 2 month f/up//db/resch with patient/jls   Vital Signs:  Patient Profile:   45 Years Old Female Height:     67 inches Weight:      214 pounds Temp:     98.6 degrees F oral Pulse rate:   80 / minute Resp:     14 per minute BP sitting:   140 / 80  (left arm)  Vitals Entered By: [name], LPN (July 23, 2008 11:34 AM)                 Chief Complaint:  f/u pristiq and ativan- both are working well with no side effects- Remus Loffler is not working and Depressive symptoms.  History of Present Illness:  Depressive Symptoms      This is a 45 year old woman who presents with Depressive symptoms.  The patient reports insomnia, but denies depressed mood, loss of interest/pleasure, significant weight loss, significant weight gain, hypersomnia, psychomotor agitation, and psychomotor retardation.  The patient also reports fatigue or loss of energy.  The patient denies feelings of worthlessness, diminished concentration, indecisiveness, thoughts of death, thoughts of suicide, suicidal intent, and suicidal plans.  Patient's past history includes depression.  The patient reports the following manic symptoms: abnormally irritable mood.  The patient denies distractibility, flight of ideas, increased goal-directed activity, and inflated self-esteem/ grandiosity.      Current Allergies: * IVP DYE  Past Medical History:    Reviewed history from 09/29/2007 and no changes required:       UTIs       Nephrolithiasis, hx of  Past Surgical History:    Reviewed history from 05/15/2008 and no changes required:       Colonoscopy       Cholecystectomy       herniorrhaphy       Laparoscopy       tubal ligation   Family History:    Reviewed history from 09/29/2007 and no changes required:       Family History Breast cancer 1st degree relative <50       Family History of Cardiovascular disorder  Social History:    Reviewed history from 09/29/2007 and no changes required:        Occupation:       Married       Current Smoker       Alcohol use-no       Drug use-no    Review of Systems  The patient denies anorexia, fever, weight loss, weight gain, vision loss, decreased hearing, hoarseness, chest pain, syncope, dyspnea on exertion, peripheral edema, prolonged cough, headaches, hemoptysis, abdominal pain, melena, hematochezia, severe indigestion/heartburn, hematuria, incontinence, genital sores, muscle weakness, suspicious skin lesions, transient blindness, difficulty walking, depression, unusual weight change, abnormal bleeding, enlarged lymph nodes, angioedema, and breast masses.     Physical Exam  General:     alert and overweight-appearing.   Head:     normocephalic and atraumatic.   Eyes:     pupils equal and pupils round.   Ears:     R ear normal and L ear normal.   Nose:     no external deformity and no nasal discharge.   Mouth:     good dentition and pharynx pink and moist.   Neck:     supple and no masses.   Lungs:     normal respiratory effort.   Heart:     normal rate, regular rhythm, no murmur, and no gallop.   Abdomen:  soft, normal bowel sounds, no masses, and no guarding.      Impression & Recommendations:  Problem # 1:  SYMPTOMATIC MENOPAUSAL/FEMALE CLIMACTERIC STATES (ICD-627.2) use of the pristiq is helping the mood but insomnia is persistnat Her updated medication list for this problem includes:    Combipatch 0.05-0.14 Mg/day Pttw (Estradiol-norethindrone acet) .Marland Kitchen... Apply to skin twice weekly Discussed treatment options.   Problem # 2:  TRANSIENT DISORDER INITIATING/MAINTAINING SLEEP (ICD-307.41) discussion of pain tylenol PM ambien did not work  Complete Medication List: 1)  Ativan 0.5 Mg Tabs (Lorazepam) .... One tab three times a day 2)  Pristiq 50 Mg Tb24 (Desvenlafaxine succinate) .... One by mouth daily 3)  Combipatch 0.05-0.14 Mg/day Pttw (Estradiol-norethindrone acet) .... Apply to skin twice  weekly   Patient Instructions: 1)  Please schedule a follow-up appointment in 2 weeks.   Prescriptions: ATIVAN 0.5 MG  TABS (LORAZEPAM) one tab three times a day  #90.0 Each x 3   Entered and Authorized by:   Stacie Glaze MD   Signed by:   Stacie Glaze MD on 07/23/2008   Method used:   Faxed to ...       [id number]*       [address]       [place], [place]  [address]       Ph: [phone]       Fax: [phone]   RxID:   [id number]  ]

## 2011-01-23 NOTE — Progress Notes (Signed)
Summary: rx for patches  Phone Note Call from Patient Call back at Home Phone [phone]   Caller: Patient Call For: Stacie Glaze MD Reason for Call: Acute Illness Summary of Call: wants a rx for nicotine patches [facility] Initial call taken by: Alfred Levins, CMA,  January 07, 2009 11:39 AM  Follow-up for Phone Call        pt informed these are otc- make sure to use  as directed and not to use more than ordered Follow-up by: [name], LPN,  January 07, 2009 11:46 AM

## 2011-01-23 NOTE — Progress Notes (Signed)
Summary: lab results  Phone Note Call from Patient Call back at Home Phone [phone] Call back at [phone]   Caller: vm Call For: [name] Reason for Call: Lab or Test Results Initial call taken by: Rudy Jew, RN,  April 01, 2009 1:01 PM  Follow-up for Phone Call        wnl left message on machine  Follow-up by: [name], LPN,  April 01, 2009 1:29 PM

## 2011-01-23 NOTE — Assessment & Plan Note (Signed)
Summary: 2 month rov/njr pt rsc/njr   Vital Signs:  Patient profile:   45 year old female Height:      67 inches Weight:      224 pounds BMI:     35.21 Temp:     97.5 degrees F oral Pulse rate:   76 / minute Resp:     14 per minute BP sitting:   120 / 80  (left arm) Cuff size:   large  Vitals Entered By: [name], LPN (March 21, 2009 3:01 PM)  CC:  roa- c/o low abd pressure.  History of Present Illness: History of   work up for blood in urins and flank pain had a CT ordered by GYN  ( renal stones but no ureter stones) had culture and has not heard any results Back and side pain on both flanks left greater that right, hematuria and no fever or chills, mod/mild nausia   Problems Prior to Update: 1)  Dermatitis, Atopic  (ICD-691.8) 2)  Neck Sprain and Strain  (ICD-847.0) 3)  Restless Leg Syndrome  (ICD-333.94) 4)  Transient Disorder Initiating/maintaining Sleep  (ICD-307.41) 5)  Hemorrhoids, Internal  (ICD-455.0) 6)  Colonic Polyps, Hyperplastic  (ICD-211.3) 7)  Umbilical Hernia  (ICD-553.1) 8)  Weight Gain  (ICD-783.1) 9)  Symptomatic Menopausal/female Climacteric States  (ICD-627.2) 10)  Migraine Headache  (ICD-346.90) 11)  Preventive Health Care  (ICD-V70.0) 12)  Family History Breast Cancer 1st Degree Relative <50  (ICD-V16.3) 13)  Nephrolithiasis, Hx of  (ICD-V13.01)  Medications Prior to Update: 1)  Ativan 0.5 Mg  Tabs (Lorazepam) .... One Tab Once Daily 2)  Pristiq 50 Mg  Tb24 (Desvenlafaxine Succinate) .... One By Mouth Daily 3)  Climara Pro 0.045-0.015 Mg/day Ptwk (Estradiol-Levonorgestrel) .... Once Patch To Skin  Weekly 4)  Lunesta 3 Mg Tabs (Eszopiclone) .Marland Kitchen.. 1 At Bedtime As Needed 5)  Mirapex 0.25 Mg Tabs (Pramipexole Dihydrochloride) .... One By Mouth Daily 6)  Flexeril 10 Mg Tabs (Cyclobenzaprine Hcl) .... Three Times A Day As Needed Spasm 7)  Diprolene 0.05 % Lotn (Aug Betamethasone Dipropionate) .... Apply A Small Amount To The Rash Daily 8)   Metronidazole 0.75 % Gel (Metronidazole) .... Apply To Face Bid  Current Medications (verified): 1)  Ativan 0.5 Mg  Tabs (Lorazepam) .... One Tab Once Daily 2)  Pristiq 50 Mg  Tb24 (Desvenlafaxine Succinate) .... One By Mouth Daily 3)  Activella 0.5-0.1 Mg Tabs (Estradiol-Norethindrone Acet) .... Use As Directed 4)  Mirapex 0.25 Mg Tabs (Pramipexole Dihydrochloride) .... One By Mouth Daily 5)  Metronidazole 0.75 % Gel (Metronidazole) .... Apply To Face Bid  Allergies (verified): 1)  * Ivp Dye  Past History:  Family History:    Family History Breast cancer 1st degree relative <50    Family History of Cardiovascular disorder     (09/29/2007)  Social History:    Occupation:    Married    Current Smoker    Alcohol use-no    Drug use-no     (09/29/2007)  Risk Factors:    Alcohol Use: N/A    >5 drinks/d w/in last 3 months: N/A    Caffeine Use: N/A    Diet: N/A    Exercise: N/A  Risk Factors:    Smoking Status: current (09/29/2007)    Packs/Day: 1 (12/29/2008)    Cigars/wk: N/A    Pipe Use/wk: N/A    Cans of tobacco/wk: N/A    Passive Smoke Exposure: N/A  Past medical, surgical, family and social  histories (including risk factors) reviewed, and no changes noted (except as noted below).  Past Medical History:    Reviewed history from 09/29/2007 and no changes required:    UTIs    Nephrolithiasis, hx of  Past Surgical History:    Reviewed history from 05/15/2008 and no changes required:    Colonoscopy    Cholecystectomy    herniorrhaphy    Laparoscopy    tubal ligation  Family History:    Reviewed history from 09/29/2007 and no changes required:       Family History Breast cancer 1st degree relative <50       Family History of Cardiovascular disorder  Social History:    Reviewed history from 09/29/2007 and no changes required:       Occupation:       Married       Current Smoker       Alcohol use-no       Drug use-no  Review of Systems       The patient  complains of hematuria.  The patient denies anorexia, fever, weight loss, weight gain, vision loss, decreased hearing, hoarseness, chest pain, syncope, dyspnea on exertion, peripheral edema, prolonged cough, headaches, hemoptysis, abdominal pain, melena, hematochezia, severe indigestion/heartburn, incontinence, genital sores, muscle weakness, suspicious skin lesions, transient blindness, difficulty walking, depression, unusual weight change, abnormal bleeding, enlarged lymph nodes, angioedema, and breast masses.         flank pain  Physical Exam  General:  Well-developed,well-nourished,in no acute distress; alert,appropriate and cooperative throughout examination Head:  Normocephalic and atraumatic without obvious abnormalities. No apparent alopecia or balding. Ears:  R ear normal and L ear normal.   Nose:  no external deformity and no nasal discharge.   Mouth:  good dentition and pharynx pink and moist.   Neck:  supple and no masses.   Lungs:  normal respiratory effort.   Heart:  normal rate, regular rhythm, no murmur, and no gallop.     Impression & Recommendations:  Problem # 1:  FLANK PAIN (ICD-789.09) review the ct and refer to urology, failed courses of septra and macrobid The following medications were removed from the medication list:    Flexeril 10 Mg Tabs (Cyclobenzaprine hcl) .Marland Kitchen... Three times a day as needed spasm  Discussed use of medications, application of heat or cold, and exercises.  the ct showes stones on the left and th flank pain is increased on the left  Problem # 2:  HEMATURIA UNSPECIFIED (ICD-599.70)  reveiw the CT and discuss the referral to urology, review the urine culture and change the antibiotic  Discussed medication use and hematuria work-up.   Her updated medication list for this problem includes:    Ciprofloxacin Hcl 250 Mg Tabs (Ciprofloxacin hcl) ..... One by mouth bid  Complete Medication List: 1)  Ativan 0.5 Mg Tabs (Lorazepam) .... One tab  once daily 2)  Pristiq 50 Mg Tb24 (Desvenlafaxine succinate) .... One by mouth daily 3)  Activella 0.5-0.1 Mg Tabs (Estradiol-norethindrone acet) .... Use as directed 4)  Mirapex 0.25 Mg Tabs (Pramipexole dihydrochloride) .... One by mouth daily 5)  Metronidazole 0.75 % Gel (Metronidazole) .... Apply to face bid 6)  Ciprofloxacin Hcl 250 Mg Tabs (Ciprofloxacin hcl) .... One by mouth bid 7)  Phenazopyridine Hcl 100 Mg Tabs (Phenazopyridine hcl) .... One by mouth q 4 hours as needed flank pain  Patient Instructions: 1)  refer to urologist    Dr Earlene Plater  if the medss do not work 2)  Please schedule a follow-up appointment in 2 weeks. Prescriptions: PHENAZOPYRIDINE HCL 100 MG TABS (PHENAZOPYRIDINE HCL) one by mouth q 4 hours as needed flank pain  #40 x 1   Entered and Authorized by:   Stacie Glaze MD   Signed by:   Stacie Glaze MD on 03/21/2009   Method used:   Electronically to        [facility] #[id number]* (retail)       [address] [place], [place]  [address]       Ph: [phone]       Fax: [phone]   RxID:   [id number] CIPROFLOXACIN HCL 250 MG TABS (CIPROFLOXACIN HCL) one by mouth BID  #28 x 0   Entered and Authorized by:   Stacie Glaze MD   Signed by:   Stacie Glaze MD on 03/21/2009   Method used:   Electronically to        [facility] #[id number]* (retail)       [address] [place], [place]  [address]       Ph: [phone]       Fax: [phone]   RxID:   [id number]   Appended Document: 2 month rov/njr pt rsc/njr  Laboratory Results   Urine Tests    Routine Urinalysis   Color: yellow Appearance: Clear Glucose: negative   (Normal Range: Negative) Bilirubin: negative   (Normal Range: Negative) Ketone: negative   (Normal Range: Negative) Spec. Gravity: 1.010   (Normal Range: 1.003-1.035) Blood: trace-lysed   (Normal Range: Negative) pH: 7.0   (Normal Range: 5.0-8.0) Protein: negative   (Normal Range:  Negative) Urobilinogen: 0.2   (Normal Range: 0-1) Nitrite: negative   (Normal Range: Negative) Leukocyte Esterace: negative   (Normal Range: Negative)    Comments: Rita Ohara  March 23, 2009 1:46 PM

## 2011-01-23 NOTE — Procedures (Signed)
Summary: colonoscopy  colonoscopy   Imported By: Kassie Mends 03/02/2008 11:09:39  _____________________________________________________________________  External Attachment:    Type:   Image     Comment:   colonoscopy

## 2011-01-23 NOTE — Progress Notes (Signed)
  Phone Note Call from Patient Call back at Work Phone [phone]   Caller: Patient Call For: Stacie Glaze MD Summary of Call: Pt would like RX for nerves........she is flying to the [place] next week. [facility] [place] [place] in [place].  does not know the number. [phone] Pt.  Initial call taken by: [facility] CMA AAMA,  November 03, 2010 12:52 PM  Follow-up for Phone Call        may send in xanax .25 three times a day as needed number 10 Follow-up by: Stacie Glaze MD,  November 03, 2010 1:20 PM    New/Updated Medications: XANAX 0.25 MG TABS (ALPRAZOLAM) one by mouth three times a day as needed anxiety Prescriptions: XANAX 0.25 MG TABS (ALPRAZOLAM) one by mouth three times a day as needed anxiety  #10 x 0   Entered by:   [name] CMA AAMA   Authorized by:   Stacie Glaze MD   Signed by:   [name] CMA AAMA on 11/03/2010   Method used:   Telephoned to ...       [facility] [phone]* (retail)       [address]       [place], [place]  [address]       Ph: [phone]       Fax:    RxID:   [id number]

## 2011-01-23 NOTE — Progress Notes (Signed)
Summary: SAMPLES   Phone Note Call from Patient Call back at [phone]   Caller: Patient Call For: DR Lennon Alstrom Summary of Call: WANTS NICODERM 21MG  PATCH SAMPLES. THESE WERE GIVEN TO HER IN THE HOSPITAL. Initial call taken by: Warnell Forester,  September 05, 2007 2:20 PM  Follow-up for Phone Call        we do not get these they are over the counter Follow-up by: Stacie Glaze MD,  September 05, 2007 5:09 PM  Additional Follow-up for Phone Call Additional follow up Details #1::        Informed. Left message. Additional Follow-up by: Rudy Jew, RN,  September 05, 2007 5:17 PM

## 2011-01-23 NOTE — Assessment & Plan Note (Signed)
Summary: 3 wk rov/njr   Vital Signs:  Patient profile:   45 year old female Height:      67 inches Weight:      222 pounds BMI:     34.90 Temp:     98.2 degrees F oral Pulse rate:   76 / minute Resp:     14 per minute BP sitting:   136 / 80  (left arm) Cuff size:   large  Vitals Entered By: [name], LPN (May 25, 1609 3:21 PM)  CC:  roa- med changes.  History of Present Illness: Having panic attacks ( hx of similar symptoms after a historectomy) had experienced  severe withdrawl from the pristiq when used the ativan just before bed the panic is lessened when she took the seroquil the panic stopped   Problems Prior to Update: 1)  Drug Withdrawal  (ICD-292.0) 2)  Sebaceous Cyst, Infected  (ICD-706.2) 3)  Abdominal Pain, Left Lower Quadrant  (ICD-789.04) 4)  Flank Pain  (ICD-789.09) 5)  Hematuria Unspecified  (ICD-599.70) 6)  Dermatitis, Atopic  (ICD-691.8) 7)  Neck Sprain and Strain  (ICD-847.0) 8)  Restless Leg Syndrome  (ICD-333.94) 9)  Transient Disorder Initiating/maintaining Sleep  (ICD-307.41) 10)  Hemorrhoids, Internal  (ICD-455.0) 11)  Colonic Polyps, Hyperplastic  (ICD-211.3) 12)  Umbilical Hernia  (ICD-553.1) 13)  Weight Gain  (ICD-783.1) 14)  Symptomatic Menopausal/female Climacteric States  (ICD-627.2) 15)  Migraine Headache  (ICD-346.90) 16)  Preventive Health Care  (ICD-V70.0) 17)  Family History Breast Cancer 1st Degree Relative <50  (ICD-V16.3) 18)  Nephrolithiasis, Hx of  (ICD-V13.01)  Medications Prior to Update: 1)  Ativan 0.5 Mg  Tabs (Lorazepam) .... One Tab  By Mouth Three Times A Day or As Directed 2)  Combipatch 0.05-0.14 Mg/day Pttw (Estradiol-Norethindrone Acet) 3)  Mirapex 0.25 Mg Tabs (Pramipexole Dihydrochloride) .... One By Mouth Daily 4)  Metronidazole 0.75 % Gel (Metronidazole) .... Apply To Face Bid 5)  Phenazopyridine Hcl 100 Mg Tabs (Phenazopyridine Hcl) .... One By Mouth Q 4 Hours As Needed Flank Pain 6)  Vicodin 5-500  Mg Tabs (Hydrocodone-Acetaminophen) .... One By Mouth Q 6 Hours As Needed Pain 7)  Seroquel 25 Mg Tabs (Quetiapine Fumarate) .... One By Mouth Q Hs X 5 Nights  Current Medications (verified): 1)  Ativan 0.5 Mg  Tabs (Lorazepam) .... One Tab  By Mouth Three Times A Day or As Directed 2)  Combipatch 0.05-0.14 Mg/day Pttw (Estradiol-Norethindrone Acet) 3)  Metronidazole 0.75 % Gel (Metronidazole) .... Apply To Face Bid 4)  Vicodin 5-500 Mg Tabs (Hydrocodone-Acetaminophen) .... One By Mouth Q 6 Hours As Needed Pain  Allergies (verified): 1)  ! Pristiq (Desvenlafaxine Succinate) 2)  * Ivp Dye  Past History:  Family History: Last updated: 09/29/2007 Family History Breast cancer 1st degree relative <50 Family History of Cardiovascular disorder  Social History: Last updated: 09/29/2007 Occupation: Married Current Smoker Alcohol use-no Drug use-no  Risk Factors: Smoking Status: current (09/29/2007) Packs/Day: 1 (12/29/2008)  Past medical, surgical, family and social histories (including risk factors) reviewed, and no changes noted (except as noted below).  Past Medical History: Reviewed history from 09/29/2007 and no changes required. UTIs Nephrolithiasis, hx of  Past Surgical History: Reviewed history from 05/15/2008 and no changes required. Colonoscopy Cholecystectomy herniorrhaphy Laparoscopy tubal ligation  Family History: Reviewed history from 09/29/2007 and no changes required. Family History Breast cancer 1st degree relative <50 Family History of Cardiovascular disorder  Social History: Reviewed history from 09/29/2007 and no changes required.  Occupation: Married Current Smoker Alcohol use-no Drug use-no  Review of Systems  The patient denies anorexia, fever, weight loss, weight gain, vision loss, decreased hearing, hoarseness, chest pain, syncope, dyspnea on exertion, peripheral edema, prolonged cough, headaches, hemoptysis, abdominal pain, melena,  hematochezia, severe indigestion/heartburn, hematuria, incontinence, genital sores, muscle weakness, suspicious skin lesions, transient blindness, difficulty walking, depression, unusual weight change, abnormal bleeding, enlarged lymph nodes, angioedema, and breast masses.    Physical Exam  General:  Well-developed,well-nourished,in no acute distress; alert,appropriate and cooperative throughout examination Eyes:  No corneal or conjunctival inflammation noted. EOMI. Perrla. Funduscopic exam benign, without hemorrhages, exudates or papilledema. Vision grossly normal. Ears:  R ear normal and L ear normal.   Nose:  no external deformity and no nasal discharge.   Mouth:  good dentition and pharynx pink and moist.   Neck:  supple and no masses.   Lungs:  normal respiratory effort.   Heart:  normal rate, regular rhythm, no murmur, and no gallop.   Abdomen:  normal bowel sounds, no distention, LUQ tenderness, and L flank tenderness.   Psych:  labile affect and moderately anxious.     Impression & Recommendations:  Problem # 1:  DYSTHYMIC DISORDER (ICD-300.4) trial of seraquil 50 mg q HS and add  adderal in the day for focus and for weight gain anf fatique  Problem # 2:  ACNE VULGARIS, FACIAL (ICD-706.1)  Discussed care of the skin and different treatment options.   Her updated medication list for this problem includes:    Doxycycline Hyclate 100 Mg Caps (Doxycycline hyclate) ..... One by mouth daily  Complete Medication List: 1)  Ativan 0.5 Mg Tabs (Lorazepam) .... One tab  by mouth three times a day or as directed 2)  Combipatch 0.05-0.14 Mg/day Pttw (Estradiol-norethindrone acet) 3)  Metronidazole 0.75 % Gel (Metronidazole) .... Apply to face bid 4)  Vicodin 5-500 Mg Tabs (Hydrocodone-acetaminophen) .... One by mouth q 6 hours as needed pain 5)  Adderall Xr 20 Mg Xr24h-cap (Amphetamine-dextroamphetamine) .... One by mouth q am 6)  Doxycycline Hyclate 100 Mg Caps (Doxycycline hyclate)  .... One by mouth daily 7)  Seroquel Xr 50 Mg Xr24h-tab (Quetiapine fumarate) .... One by mouth q hs  Patient Instructions: 1)  Please schedule a follow-up appointment in 3 weeks. Prescriptions: DOXYCYCLINE HYCLATE 100 MG CAPS (DOXYCYCLINE HYCLATE) one by mouth daily  #30 x 4   Entered and Authorized by:   Stacie Glaze MD   Signed by:   Stacie Glaze MD on 05/25/2009   Method used:   Electronically to        [facility] #[id number]* (retail)       [address] [place], [place]  [address]       Ph: [phone]       Fax: [phone]   RxID:   [id number] ADDERALL XR 20 MG XR24H-CAP (AMPHETAMINE-DEXTROAMPHETAMINE) one by mouth q AM  #30 x 0   Entered and Authorized by:   Stacie Glaze MD   Signed by:   Stacie Glaze MD on 05/25/2009   Method used:   Print then Give to Patient   RxID:   [id number]

## 2011-01-23 NOTE — Progress Notes (Signed)
Summary: cream for roseca  Phone Note Call from Patient Call back at [phone]   Caller: LIVE Call For: [name] Summary of Call: Need cream for rosacea, has rash on face.  [facility].  Allergic IVP dye. Initial call taken by: Rudy Jew, RN,  February 16, 2009 3:34 PM    New/Updated Medications: METRONIDAZOLE 0.75 % GEL (METRONIDAZOLE) apply to face bid   Prescriptions: METRONIDAZOLE 0.75 % GEL (METRONIDAZOLE) apply to face bid  #75gm x 1   Entered by:   [name], LPN   Authorized by:   Stacie Glaze MD   Signed by:   [name], LPN on 20/25/4270   Method used:   Electronically to        [facility] #[id number]* (retail)       [address] [place], [place]  [address]       Ph: [phone]       Fax: [phone]   RxID:   [id number]   Appended Document: cream for roseca metronidazole gel called in and left message on machine for pt

## 2011-01-23 NOTE — Progress Notes (Signed)
Summary: when to be seen  Phone Note Call from Patient   Caller: Patient Call For: Stacie Glaze MD Summary of Call: Pt. went to the [facility] consult and was told there were no urinary abnormalities.  They did tell her they saw diverticulosis or diverticulitis, not sure which.  She wants to know when to be seen again, and to ask you to look at her reports. [phone] Initial call taken by: [name] CMA,  March 24, 2009 1:04 PM  Follow-up for Phone Call        talked with pt and perct of abd she has 2 small renal calculi- per dr Lovell Sheehan - he thinks it is kidney stone and to complete anitibioitc and drink lots of water- pt informed and instructedd to go to er if unbearable pain and if not better by monday give [place] a call on mondya Follow-up by: [name], LPN,  March 24, 2009 1:22 PM

## 2011-01-23 NOTE — Progress Notes (Signed)
Summary: refill Ativan  Phone Note Call from Patient   Caller: Patient Call For: Dr. Lovell Sheehan Reason for Call: Talk to Doctor Summary of Call: Pt requesting refill on Ativan .5 mg. one by mouth three times a day. [name]) Initial call taken by: [name] CMA,  June 09, 2008 4:16 PM  Follow-up for Phone Call        called to pharmacy Follow-up by: [name], LPN,  June 10, 2008 9:53 AM      Prescriptions: ATIVAN 0.5 MG  TABS (LORAZEPAM) one tab three times a day  #90 x 1   Entered by:   [name], LPN   Authorized by:   Stacie Glaze MD   Signed by:   [name], LPN on 21/30/8657   Method used:   Electronically sent to ...       [facility] #[id number]*       [address]       [place], [place]  [address]       Ph: [phone]       Fax: [phone]   RxID:   [id number]

## 2011-01-23 NOTE — Assessment & Plan Note (Signed)
Summary: 6 wk rov/njr   Vital Signs:  Patient Profile:   45 Years Old Female Height:     67 inches Weight:      212 pounds Temp:     98.5 degrees F oral Pulse rate:   76 / minute Resp:     14 per minute BP sitting:   120 / 80  (left arm)  Vitals Entered By: [name], LPN (September 06, 2008 4:02 PM)                 Chief Complaint:  roa---lunesta not helping sleep.    Current Allergies: * IVP DYE        Impression & Recommendations:  Problem # 1:  WEIGHT GAIN (ICD-783.1) walking  Problem # 2:  MIGRAINE HEADACHE (ICD-346.90) Headache diary reviewed. increased frequency of migrain HA with lack of sleep and increased stress  Problem # 3:  RESTLESS LEG SYNDROME (ICD-333.94) trial of sample medications  Problem # 4:  SYMPTOMATIC MENOPAUSAL/FEMALE CLIMACTERIC STATES (ICD-627.2) the pristiq Her updated medication list for this problem includes:    Combipatch 0.05-0.14 Mg/day Pttw (Estradiol-norethindrone acet) .Marland Kitchen... Apply to skin twice weekly Discussed treatment options.   Complete Medication List: 1)  Ativan 0.5 Mg Tabs (Lorazepam) .... One tab three times a day 2)  Pristiq 50 Mg Tb24 (Desvenlafaxine succinate) .... One by mouth daily 3)  Combipatch 0.05-0.14 Mg/day Pttw (Estradiol-norethindrone acet) .... Apply to skin twice weekly 4)  Lunesta 3 Mg Tabs (Eszopiclone) .Marland Kitchen.. 1 at bedtime as needed 5)  Mirapex 0.25 Mg Tabs (Pramipexole dihydrochloride) .... One by mouth daily   Patient Instructions: 1)  Please schedule a follow-up appointment in 6 weeks.   ]

## 2011-03-27 LAB — POCT RAPID STREP A (OFFICE): Streptococcus, Group A Screen (Direct): NEGATIVE

## 2011-03-31 LAB — CBC
HCT: 40.5 % (ref 36.0–46.0)
Hemoglobin: 14 g/dL (ref 12.0–15.0)
MCHC: 34.6 g/dL (ref 30.0–36.0)
MCV: 87.8 fL (ref 78.0–100.0)
Platelets: 254 10*3/uL (ref 150–400)
RBC: 4.61 MIL/uL (ref 3.87–5.11)
RDW: 12.8 % (ref 11.5–15.5)
WBC: 7.6 10*3/uL (ref 4.0–10.5)

## 2011-03-31 LAB — URINALYSIS, ROUTINE W REFLEX MICROSCOPIC
Bilirubin Urine: NEGATIVE
Glucose, UA: NEGATIVE mg/dL
Ketones, ur: NEGATIVE mg/dL
Leukocytes, UA: NEGATIVE
Nitrite: NEGATIVE
Protein, ur: NEGATIVE mg/dL
Specific Gravity, Urine: 1.015 (ref 1.005–1.030)
Urobilinogen, UA: 0.2 mg/dL (ref 0.0–1.0)
pH: 7.5 (ref 5.0–8.0)

## 2011-03-31 LAB — DIFFERENTIAL
Basophils Absolute: 0 10*3/uL (ref 0.0–0.1)
Basophils Relative: 0 % (ref 0–1)
Eosinophils Absolute: 0.1 10*3/uL (ref 0.0–0.7)
Eosinophils Relative: 1 % (ref 0–5)
Lymphocytes Relative: 31 % (ref 12–46)
Lymphs Abs: 2.3 10*3/uL (ref 0.7–4.0)
Monocytes Absolute: 0.5 10*3/uL (ref 0.1–1.0)
Monocytes Relative: 6 % (ref 3–12)
Neutro Abs: 4.7 10*3/uL (ref 1.7–7.7)
Neutrophils Relative %: 62 % (ref 43–77)

## 2011-03-31 LAB — WET PREP, GENITAL
Trich, Wet Prep: NONE SEEN
WBC, Wet Prep HPF POC: NONE SEEN
Yeast Wet Prep HPF POC: NONE SEEN

## 2011-03-31 LAB — URINE CULTURE: Colony Count: 40000

## 2011-03-31 LAB — GC/CHLAMYDIA PROBE AMP, GENITAL
Chlamydia, DNA Probe: NEGATIVE
GC Probe Amp, Genital: NEGATIVE

## 2011-03-31 LAB — URINE MICROSCOPIC-ADD ON

## 2011-04-01 LAB — URINALYSIS, ROUTINE W REFLEX MICROSCOPIC
Bilirubin Urine: NEGATIVE
Glucose, UA: NEGATIVE mg/dL
Ketones, ur: NEGATIVE mg/dL
Leukocytes, UA: NEGATIVE
Nitrite: NEGATIVE
Protein, ur: NEGATIVE mg/dL
Specific Gravity, Urine: 1.023 (ref 1.005–1.030)
Urobilinogen, UA: 0.2 mg/dL (ref 0.0–1.0)
pH: 6 (ref 5.0–8.0)

## 2011-04-01 LAB — URINE CULTURE: Colony Count: >=100000

## 2011-04-01 LAB — URINE MICROSCOPIC-ADD ON

## 2011-04-01 LAB — DIFFERENTIAL
Basophils Absolute: 0.1 10*3/uL (ref 0.0–0.1)
Basophils Relative: 1 % (ref 0–1)
Eosinophils Absolute: 0.1 10*3/uL (ref 0.0–0.7)
Eosinophils Relative: 1 % (ref 0–5)
Lymphocytes Relative: 34 % (ref 12–46)
Lymphs Abs: 2.9 10*3/uL (ref 0.7–4.0)
Monocytes Absolute: 0.5 10*3/uL (ref 0.1–1.0)
Monocytes Relative: 6 % (ref 3–12)
Neutro Abs: 5 10*3/uL (ref 1.7–7.7)
Neutrophils Relative %: 59 % (ref 43–77)

## 2011-04-01 LAB — POCT I-STAT, CHEM 8
BUN: 14 mg/dL (ref 6–23)
Calcium, Ion: 1.17 mmol/L (ref 1.12–1.32)
Chloride: 104 mEq/L (ref 96–112)
Creatinine, Ser: 0.8 mg/dL (ref 0.4–1.2)
Glucose, Bld: 105 mg/dL — ABNORMAL HIGH (ref 70–99)
HCT: 36 % (ref 36.0–46.0)
Hemoglobin: 12.2 g/dL (ref 12.0–15.0)
Potassium: 3.5 mEq/L (ref 3.5–5.1)
Sodium: 139 mEq/L (ref 135–145)
TCO2: 25 mmol/L (ref 0–100)

## 2011-04-01 LAB — CBC
HCT: 40.2 % (ref 36.0–46.0)
Hemoglobin: 12.7 g/dL (ref 12.0–15.0)
MCHC: 31.6 g/dL (ref 30.0–36.0)
MCV: 88.3 fL (ref 78.0–100.0)
Platelets: 258 10*3/uL (ref 150–400)
RBC: 4.55 MIL/uL (ref 3.87–5.11)
RDW: 13 % (ref 11.5–15.5)
WBC: 8.5 10*3/uL (ref 4.0–10.5)

## 2011-04-03 LAB — WOUND CULTURE: Culture: NO GROWTH

## 2011-04-05 LAB — POCT CARDIAC MARKERS
CKMB, poc: <1 ng/mL — ABNORMAL LOW (ref 1.0–8.0)
Myoglobin, poc: 70.2 ng/mL (ref 12–200)
Troponin i, poc: <0.05 ng/mL (ref 0.00–0.09)

## 2011-04-05 LAB — POCT I-STAT, CHEM 8
BUN: 10 mg/dL (ref 6–23)
Calcium, Ion: 1.01 mmol/L — ABNORMAL LOW (ref 1.12–1.32)
Chloride: 106 mEq/L (ref 96–112)
Creatinine, Ser: 0.3 mg/dL — ABNORMAL LOW (ref 0.4–1.2)
Glucose, Bld: 98 mg/dL (ref 70–99)
HCT: 47 % — ABNORMAL HIGH (ref 36.0–46.0)
Hemoglobin: 16 g/dL — ABNORMAL HIGH (ref 12.0–15.0)
Potassium: 3.9 mEq/L (ref 3.5–5.1)
Sodium: 137 mEq/L (ref 135–145)
TCO2: 20 mmol/L (ref 0–100)

## 2011-04-05 LAB — URINALYSIS, ROUTINE W REFLEX MICROSCOPIC
Bilirubin Urine: NEGATIVE
Glucose, UA: NEGATIVE mg/dL
Ketones, ur: NEGATIVE mg/dL
Leukocytes, UA: NEGATIVE
Nitrite: NEGATIVE
Protein, ur: NEGATIVE mg/dL
Specific Gravity, Urine: 1.011 (ref 1.005–1.030)
Urobilinogen, UA: 0.2 mg/dL (ref 0.0–1.0)
pH: 6.5 (ref 5.0–8.0)

## 2011-04-05 LAB — URINE MICROSCOPIC-ADD ON

## 2011-04-05 LAB — POCT PREGNANCY, URINE: Preg Test, Ur: NEGATIVE

## 2011-05-08 NOTE — Op Note (Signed)
[name], [name] NO.:  [id number]   MEDICAL RECORD NO.:  [id number]          PATIENT TYPE:  INP   LOCATION:  9305                          FACILITY:  [facility]   PHYSICIAN:  Randye Lobo, [name]   DATE OF BIRTH:  09-12-1966   DATE OF PROCEDURE:  09/02/2007  DATE OF DISCHARGE:                               OPERATIVE REPORT   PREOPERATIVE DIAGNOSIS:  1. Dysmenorrhea.  2. Chronic pelvic pain.   POSTOPERATIVE DIAGNOSIS:  1. Dysmenorrhea.  2. Chronic pelvic pain.  3. Minimal endometriosis.  4. Hemorrhagic left ovarian cyst.  Omental umbilical adhesions.   PROCEDURE:  Total abdominal hysterectomy, bilateral oophorectomy,  omental lysis of adhesions.   SURGEON:  Conley Simmonds, MD   ASSISTANT:  Lodema Hong, MD.   ANESTHESIA:  General endotracheal.   IV FLUIDS:  2500 mL Ringer's lactate.   ESTIMATED BLOOD LOSS:  150 mL.   URINE OUTPUT:  350 mL.   COMPLICATIONS:  None.   INDICATIONS FOR PROCEDURE:  The patient is a 45 year old gravida 4, para  2-0-2-0 Caucasian female, status post laparotomy with bilateral  salpingectomy in 2005 for a ruptured right ectopic pregnancy which was  subsequent to a bilateral tubal ligation of several years prior, who  presents with chronic dysmenorrhea and pelvic pain.  The patient is also  reporting dyspareunia and ovulatory pain.   The patient has had a longstanding history of abdominal pain which has  prompted multiple CT scans and ultrasounds of the abdomen and the pelvis  which have essentially been only remarkable for a left kidney stone  measuring approximately 3 cm.  The patient has had urologic consultation  with Dr. Gaynelle Arabian she has had a cystoscopy which was unremarkable.  The patient has also had consultation with Dr. Gerilyn Pilgrim from [facility] and she underwent a colonoscopy with polypectomy of a  benign polyp.   The patient is a smoker and unable to take oral contraceptive pills.  She declines any  future childbearing and she wishes to proceed with  hysterectomy and removal of the ovaries after risks, benefits, and  alternatives are reviewed.  The patient understands that removal of the  ovaries will lead to surgical menopause and she is interested in hormone  replacement therapy with estrogen.  The patient also understands the  hysterectomy with removal of the ovaries is not a guarantee that she  will be 100% painfree.   FINDINGS:  The patient is noted to have a small and unremarkable uterus.  The fallopian tubes were absent bilaterally.  The right ovary  demonstrates a 3 mm dark brownish lesions of endometriosis.  There are  two 3 mm dark lesions of endometriosis noted in the right broad  ligament.  The left ovary contained a bilobed hemorrhagic cyst which  measured approximately 2.5 x 4 cm in diameter.  Throughout the remainder  of the pelvis there were no other lesions of endometriosis and there was  no evidence of any adhesive disease.   The appendix could not be identified and it appeared to be surgically  absent based on the  appearance of the cecum.  The upper abdomen was  explored and there were extensive omental adhesions which were adherent  to the umbilical and infraumbilical region where the patient had  previously had an umbilical herniorrhaphy with mesh placed.  The  adhesions extended from mid way between the pubis and the umbilicus all  the way up to the falciform ligament.  There did not appear to be any  bowel involved with these omental adhesions.  The liver was palpably  normal.  The gallbladder was noted to be surgically absent.  The kidneys  were not easily palpable.   SPECIMENS:  The uterus and bilateral ovaries were sent to pathology.   PROCEDURE:  The patient was reidentified in the preoperative hold area.  She received Ancef 1 gram IV for antibiotic prophylaxis.  The patient  received both TED hose and PAS stockings for DVT prophylaxis.   In the  operating room, general endotracheal anesthesia was induced.  The  abdomen and vagina were sterilely prepped and a Foley catheter was  placed inside the bladder.  She was sterilely draped.   A Pfannenstiel incision was create created along the patient's previous  Pfannenstiel incision.  This was performed sharply with a scalpel.  The  dissection was carried down to the fascia using monopolar cautery for  hemostasis.  The fascia was then incised transversely in the midline and  the incision was extended bilaterally with the Mayo scissors.  The  rectus muscles were dissected sharply off of the fascia superiorly and  inferiorly.  The rectus muscles were then sharply divided in the midline  with the Mayo scissors.  The parietal peritoneum was elevated with two  hemostat clamps and was entered sharply.  Immediately the omental  adhesions were encountered which were lysed with a combination of sharp  dissection and monopolar cautery in order to have access into the  peritoneal cavity.   The self-retaining retractor was then placed in the pelvis and the bowel  was packed into the upper abdomen using moist lap pads.   The pelvis was explored and the findings are as noted above.   Long Kelly clamps were placed across the round ligaments and the utero-  ovarian ligaments in order to elevate the uterus.  The right round  ligament was then suture ligated with a transfixing suture of 0 Vicryl.  It was divided using monopolar cautery.  The broad ligament was then  opened posteriorly and anteriorly using monopolar cautery.  The right  ureter was identified and the infundibulopelvic ligament was then  clamped with a Heaney clamp after a window was created through the  posterior aspect of the broad ligament.  The ligament was then sharply  divided and was ligated with a free tie of 0 Vicryl followed by suture  ligature of the same.  The uterine artery on the patient's right-hand  side was then  skeletonized with sharp dissection after the bladder flap  had been taken down on that ipsilateral side.  The same procedure that  was performed on the right-hand side was then repeated on the left-hand  side.  The round ligament was suture ligated with a transfixing suture  of 0 Vicryl, divided with monopolar cautery and the broad ligament was  opened anteriorly and posteriorly.  Again the left ureter was identified  and the infundibulopelvic ligament was clamped and sharply divided.  It  was ligated with a free tie of 0 Vicryl followed by suture ligature of  the same.  The bladder flap was sharply created on the patient's left-  hand side and the left uterine artery was skeletonized.  Each of the  uterine arteries were then clamped with Heaney clamps, sharply divided,  and suture ligated with 0 Vicryl bilaterally.  A Heaney clamp was then  placed along the descending branches of the right uterine artery.  This  pedicle was sharply divided and then a suture ligature of 0 Vicryl was  placed around the first tie of the uterine artery such that this was a  second tie on the same pedicle.  The inferior branches of the left  uterine artery were clamped, sharply divided, and suture ligated with 0  Vicryl.  The uterosacral ligament on the patient's left-hand side was  clamped, sharply divided, and suture ligated with a transfixing suture  of 0 Vicryl.  Entry into the vagina was successful at this point.  The  cervix was circumscribed from the surrounding vagina using a Jorgenson  scissors.  The specimen was removed and was sent to pathology.   The vagina was grasped with Kocher clamps.  Modified Richardson angle  sutures were placed bilaterally.  The vaginal cuff was closed with  figure-of-eight sutures of 0 Vicryl for good hemostasis.   The pelvis was then irrigated and suctioned and the operative sites were  found to be hemostatic.   An exploration of the upper abdomen was performed and the  findings are  as noted above.  Some of the omental adhesions along the inferior most  aspect of the mesh were then divided with a combination of monopolar  cautery and free ties of 0-0 and 2-0 Vicryl in order to remove a loop  which had been created in the omentum during entry into the peritoneal  cavity.  The adhesions were noted to be extensive and there was no bowel  in this region and the remaining adhesions were therefore not removed  from the umbilical herniorrhaphy mesh which was protected by the  omentum.   The operative sites were noted to be hemostatic at this time.  The lap  pads were removed from the upper abdomen prior to exploration of the  upper abdomen.  The retractor was removed at this time.   The parietal peritoneum was closed with a running suture of 2-0 Vicryl.  The rectus muscles were reapproximated in the midline with interrupted  sutures of 0 Vicryl.  The fascia was closed with a running suture of 0  PDS.  The subcutaneous layer was irrigated and suctioned and made  hemostatic with monopolar cautery.  Interrupted sutures of 2-0 plain  were placed in the subcutaneous layer and the skin was closed with  staples.  Sterile bandage was placed over the incision.   This concluded the patient's procedure.  There were no complications.  All needle, instrument, sponge counts were correct.      Randye Lobo, [name]  Electronically Signed     BES/MEDQ  D:  09/02/2007  T:  09/03/2007  Job:  [id number]   cc:   Windy Fast L. Earlene Plater, [name]  Fax: [phone]   Rachael Fee, MD  [address]  [place], [place] [address]

## 2011-05-08 NOTE — Assessment & Plan Note (Signed)
[facility]                         GASTROENTEROLOGY OFFICE NOTE   NAME:Degnan, Jonet                         MRN:          [id number]  DATE:05/27/2007                            DOB:          1966/01/07    GI PROBLEM LIST:  Chronic intermittent abdominal pain.  Began spring  2008.  Describes the pain as spasms.  Multiple imaging studies and lab  tests all essentially normal.  CT scan May 2008 done without IV contrast  was normal, except for a small nephrolithiasis not felt to be causing  her discomfort.  Repeat CT scan May 21, 2007 small amount of free fluid  thought to be possibly physiologic in a premenopausal woman.  No  obstructive uropathy.  Small 3.3-mm stone in the lower pole of left  kidney.  This was done with and without IV contrast.  Otherwise, normal  scan.  Abdominal ultrasound May 2008 normal post cholecystectomy  findings.  Normal CBC.  Normal complete metabolic profile.  Normal TSH.  Single elevation of ALT on 1 occasion, although repeat was normal.  EGD  May 2008 was normal except for more than usual amount of small bowel  spasm.   INTERVAL HISTORY:  I last saw Briena at the time of her upper endoscopy  3 weeks ago.  At that time, the only finding was more than usual small  bowel spasm on the EGD.  I put her on Levbid 0.375 mg twice daily to  help treat the spasm, and she has not really noticed much of a  difference.  She generally is always feeling some discomfort in her  belly, describing it as a spasm.  She has had no nausea, no vomiting, no  severe abdominal distention.  Symptoms got worse late last week, and she  called the on-call physician, who reassured her, and she was already  arranged for this visit.   CURRENT MEDICATIONS:  1. Levbid 1 pill twice daily.  2. Aciphex.   PHYSICAL EXAMINATION:  Weight is 185 pounds.  Blood pressure 136/80.  Pulse 76.  CONSTITUTIONAL:  Generally well appearing.  LUNGS:  Clear to  auscultation bilaterally.  HEART:  Regular rate and rhythm.  ABDOMEN:  Soft.  Mildly tender throughout.  Non-distended.  Normal bowel  sounds.   ASSESSMENT AND PLAN:  A 45 year old woman with chronic abdominal pain.   She has had normal lab tests, and essentially normal imaging studies.  She does have a small periumbilical hernia containing some fat on 1 of  her CT scans.  This was not seen on the followup CT scan.  Her symptoms  do not seem to be that of incarcerated hernia.  She does have a lot of  spasm in her bowel.  That was the only finding on EGD.  I will have her  double her Levbid to 2 pills twice a day.  I did not mention above that  she has been having more frequent loose stools, as well as some bleeding  rectally.  We will arrange for her to have colonoscopy done this week  some time to evaluate that.  Perhaps, she has some chronic colonic  process  which is manifesting as abdominal discomforts and spasm.  At that time,  I will also get a better examination of this umbilical hernia.     Rachael Fee, MD  Electronically Signed    DPJ/MedQ  DD: 05/27/2007  DT: 05/27/2007  Job #: [id number]   cc:   Stacie Glaze, MD

## 2011-05-08 NOTE — Assessment & Plan Note (Signed)
[facility]                                 ON-CALL NOTE   NAME:[name], [name]                         MRN:          [id number]  DATE:05/22/2007                            DOB:          06-Feb-1966    TIME OF CALL:  The time was 20:10 hours.   The patient called the answering service and I returned her call.  She  was experiencing a recurrence of her stomach spasm, which she describes  as a fluttering of her abdominal wall and epigastric pain. It was  associated with some shortness of breath.  It is very consistent with a  problem  thereafter she has been having over the past few months.  She  had an EGD, which was unrevealing other than some intestinal spasm.  She  has had CT scanning as recent as yesterday; though she has had some left-  sided hydronephrosis, she does not have that anymore.  There is a left  kidney stone otherwise the CT was essentially unremarkable, except for a  small amount of pelvic free fluid.  She does not have a fever or any  vomiting.   Apparently she had some diarrhea and some rectal bleeding associated  with it within the last couple days,  which is new; that has calmed  down, however.  She is having an ultrasound tomorrow to evaluate this  epigastric and right upper quadrant pain. She is also due to follow up  with Dr. Christella Hartigan early next week.  She seems improved from talking with  her.   Previous cardiac workup and blood testing for pulmonary embolism, and x-  rays have been unrevealing.   ASSESSMENT:  This sounds like a functional bowel disturbance though the  diarrhea and rectal bleeding could signify something more serious.  At  this point she is clinically better, if not resolved, and she should  observe things.  The Levbid she is on has not really changed things over  the past few weeks.  She has to follow up with Dr. Christella Hartigan.  She knows to  call back or go to the ER if worsening symptoms develop or her breathing  worsens again.     Brittany Boop, MD,FACG  Electronically Signed    CEG/MedQ  DD: 05/22/2007  DT: 05/23/2007  Job #: [id number]   cc:   Brittany Fee, MD

## 2011-05-08 NOTE — H&P (Signed)
NAMEMarland [name]  [name] NO.:  [id number]   MEDICAL RECORD NO.:  [id number]          PATIENT TYPE:  AMB   LOCATION:  SDC                           FACILITY:  [facility]   PHYSICIAN:  Randye Lobo, [name]   DATE OF BIRTH:  12/08/1966   DATE OF ADMISSION:  DATE OF DISCHARGE:                              HISTORY & PHYSICAL   CHIEF COMPLAINT:  Right lower quadrant pain.   HISTORY OF PRESENT ILLNESS:  The patient is a 45 year old gravida 4,  para 0-0-2-0, Caucasian female status post laparotomy with bilateral  salpingectomy in 2005 for a right ectopic pregnancy following a failed  bilateral tubal ligation, who presents with right lower quadrant pain.  The patient has a chronic history of dysmenorrhea and pelvic pain which  can last throughout at least two weeks of her monthly menstrual cycle,  painful intercourse, and ovulatory pain.  The patient has had ovulatory  pain which has preceded her surgical treatment for her ectopic pregnancy  in 2005.  The patient is unable to take oral contraceptive pills due to  her use of tobacco and her age over 18 years old.   The patient recently presented to her local emergency department in May  of 2008 with right lower quadrant pain.  She had an abdominal and pelvic  CT which diagnosed her with a left kidney pole stone.  The patient  subsequently saw Windy Fast L. Earlene Plater, [name] from [facility] in follow-  up for this and she underwent a cystoscopy which was normal.  The  patient has also had a gastroenterology evaluation by Dr. Gerilyn Pilgrim at  [facility] for a colonoscopy which has been significant  for a benign polyp.   The patient is currently requiring narcotic therapy and nonsteroidal  anti-inflammatory agents in order to control her pain and she desires  hysterectomy with removal of her ovaries.  She declines any future  childbearing.   PAST OB/GYN HISTORY:  Remarkable for two prior vaginal deliveries.  The  patient is  status post bilateral tubal ligation in 1995.  The patient  has a history of two ectopic pregnancies.  She had a chemical pregnancy  in 2002 which was presumed to be an ectopic pregnancy, but with hCG  numbers which were barely positive, this could not be confirmed.  The  patient subsequently experienced a second ectopic pregnancy in 2005  which was treated with laparotomy and bilateral salpingectomy.   PAST MEDICAL HISTORY:  1. Rosacea.  2. Tobacco use.  3. Gastroesophageal reflux disease.   PAST SURGICAL HISTORY:  1. Status post bilateral tubal ligation in 1995.  2. Status post bilateral salpingectomy in 2005.  3. Status post umbilical herniorrhaphy repair with a mesh.   MEDICATIONS:  Percocet.   ALLERGIES:  IVP DYE.   SOCIAL HISTORY:  The patient is married.  She has two children of her  own by birth.  She has also her two late sister's children for whom she  is a guardian.  The patient does use tobacco and smokes approximately  one pack of cigarettes per day.  PHYSICAL EXAMINATION:  VITAL SIGNS:  Height is 5 feet 6-1/2 inches,  weight 179 pounds, blood pressure 115/75.  GENERAL:  The patient is a middle-aged Caucasian female in no acute  distress.  LUNGS:  Clear to auscultation bilaterally.  HEART:  S1 and S2 with a regular rate and rhythm.  ABDOMEN:  Well-healed Pfannenstiel incision without evidence of  herniation.  The patient also has an umbilical incision without evidence  of herniation.  PELVIC:  Normal external genitalia and urethra.  The cervix and vagina  demonstrate no lesions.  The uterus is small and nontender.  There is no  evidence of any adnexal masses nor tenderness.   IMPRESSION:  The patient is a 45 year old para 2, Caucasian female  status post bilateral salpingectomy and status post umbilical  herniorrhaphy with mesh, who presents with chronic dysmenorrhea,  ovulatory pain, and pelvic pain.   PLAN:  The patient will undergo a total abdominal  hysterectomy with  bilateral salpingo-oophorectomy on September 02, 2007, at [facility].  Risks, benefits, and alternatives have been discussed with  the patient who wishes to proceed.      Randye Lobo, [name]  Electronically Signed     BES/MEDQ  D:  09/01/2007  T:  09/01/2007  Job:  [id number]

## 2011-05-08 NOTE — Assessment & Plan Note (Signed)
[facility]                         GASTROENTEROLOGY OFFICE NOTE   NAME:ALLENFerrell, Flam                         MRN:          [id number]  DATE:05/07/2007                            DOB:          Jan 15, 1966    REASON FOR REFERRAL:  Mr. Lovell Sheehan asked me to evaluate Ms. Knoll in  consultation regarding abdominal pain.   HISTORY OF PRESENT ILLNESS:  Ms. [name] is a very pleasant 45 year old  woman who has had approximately 3 months of mid-epigastric to left upper  quadrant abdominal pain.  She describes the pain as spasms.  They tend  to come and go, been happening for about 3 months.  She was initially on  phentermine for weight loss, thought that that may be contributing and  she stopped that.  She had some lab tests done by her primary care  physician showing a normal CBC, H. pylori negative.  A comprehensive  metabolic profile was normal.  She was put on Zegerid twice daily for a  month without much improvement.  She has had a history of kidney stones  and with slowly evolving back discomfort she was evaluated by her  urologist.  She had a CT scan of the abdomen and pelvis done.  The  abdomen showed some hydronephrosis around her left kidney which was new,  and there was a left kidney stone.  She had no ureteral dilation.  Her  previous kidney stones had been on the right side.  She has had no  pyrosis, no dysphagia, no overt GI bleeding.  She does have very mild  nausea, but no vomiting.  Eating seems to help the pain, does not worsen  it.  Her bowels have been moving normally.   REVIEW OF SYSTEMS:  Notable for stable weight, is otherwise essentially  normal and is available on her nursing intake sheet.   PAST MEDICAL HISTORY:  1. Status post cholecystectomy in 2001.  2. Umbilical hernia repair.  3. Tubal ligation.  4. Right-sided kidney stones in the past.  5. Current left side kidney stone with hydronephrosis.   CURRENT MEDICINES:  None.   ALLERGIES:  TO IVP DYE.   SOCIAL HISTORY:  Married with 2 sons.  Smokes one pack of cigarettes a  day, nondrinker.   FAMILY HISTORY:  Breast cancer in her paternal grandmother.  No colon  cancer, colon polyps in family.   PHYSICAL EXAMINATION:  Weight 5 foot 7 inches, 187 pounds, blood  pressure 124/76, pulse 68.  CONSTITUTIONAL:  Generally well-appearing.  NEUROLOGIC:  Alert and oriented x3.  EYES:  Extraocular movements intact.  MOUTH:  Oropharynx moist, no lesions.  NECK:  Supple, no lymphadenopathy.  CARDIOVASCULAR:  Heart regular rate and rhythm.  LUNGS:  Clear to auscultation bilaterally.  ABDOMEN:  Soft, nontender, nondistended, normal bowel sounds.  EXTREMITIES:  No lower extremity edema.   ASSESSMENT/PLAN:  A 45 year old woman with 3 months of intermittent  epigastric to left upper quadrant discomfort, somewhat radiating to  back.   She does have left-sided hydronephrosis and a new left kidney stone.  It  seems that this may  be contributing to her discomfort, although the  urologist was not convinced that this was causing the majority of her  symptoms.  CT scan of her abdomen did not reveal any other  abnormalities.  She does not take many non-steroidal anti-inflammatory  drugs and was H. pylori negative recently, so I doubt that she has  peptic ulcer disease but I think we should proceed with upper endoscopy  at her soonest convenience.  I have also put her back on proton pump  inhibitor, instructed her to take it 20-30 minutes prior to her  breakfast meals, that is the way the pill is designed to work most  effectively.  Lastly, a repeat complete metabolic panel and a CBC.     Rachael Fee, MD  Electronically Signed    DPJ/MedQ  DD: 05/07/2007  DT: 05/07/2007  Job #: [id number]   cc:   Stacie Glaze, MD

## 2011-05-11 NOTE — H&P (Signed)
[facility]  Patient:    [name], [name]                         MRN: [id number] Adm. Date:  [id number] Attending:  Duke Salvia CC:         Excell Seltzer. Annabell Howells, [name]  Carma Leaven, P.A.-C. [facility]   History and Physical  CHIEF COMPLAINT:  Flank pain and fever.  HISTORY OF PRESENT ILLNESS: The patient is a 45 year old married white female mother of three with a history of kidney stones in the past, most recently February 2000.  She has required stenting in the past.  She has also had extracorporeal shock wave lithotripsy.  The patient reports he onset of discomfort on September 05, 2000 and, by the evening of that day, had a fever of 101.2.  She was seen on September 06, 2000 in the morning by Carma Leaven, P.A.-C.  In the office, she had a positive urinalysis.  She was sent to [facility] for a CT scan, which revealed the patient to have an edematous left kidney and was noted to have two small stones in the collecting system of the right kidney.  There was no evidence of ureteral obstruction.  The patient was given a dose of Cipro 500 mg p.o.  The patient called tonight to report that she is having ongoing fever and increasing back pain.  In the emergency department, she was febrile to 101.4 and had obvious rigors.  SHe is now admitted with pyelonephritis for IV antibiotics.  PAST SURGICAL HISTORY: 1. The patient has had laparoscopy x 2. 2. Umbilical hernia repair. 3. Cholecystectomy. 4. Kidney stones with ureteral retrieval and ureteral stenting in    February 2000. 5. Surgery on a ganglion of the right wrist.  PAST MEDICAL HISTORY: 1. Usual childhood diseases. 2. History of nephrolithiasis as noted. 3. History of uremic poisoning at the age of 87. 63. Gravida 2, para 2.  ALLERGIES:  INTRAVENOUS PYELOGRAM DYE.  HABITS:  Tobacco, one pack per day.  Alcohol none.  No recreational drug use.  FAMILY HISTORY:  Positive for heart disease with the  father with an MI and grandfather dying of an MI.  Maternal grandmother with breast cancer.  Both father and brother with kidney stones.  No colon cancer history. No diabetes history.  SOCIAL HISTORY:  The patient is a high school graduate with one year of business school.  She has been married for 11 years.  She has two sons, aged 24 and 47.  She works doing Investment banker, corporate work.  REVIEW OF SYSTEMS:  Negative for any constitutional, respiratory, gastrointestinal or genitourinary problems except as noted in the HPI.  PHYSICAL EXAMINATION;  VITAL SIGNS:  Temperature 101.4, blood pressure 145/90, heart rate 127, respirations 20.  GENERAL:  Ill appearing white female with rigors.  HEENT:  Normocephalic and atraumatic.  TMs were normal.  Throat was clear. Conjunctivae and sclerae was clear.  NECK:  Supple.  There was no thyromegaly.  CHEST:  Clear to auscultation and percussion.  She did have positive CVA tenderness.  She has positive tenderness in both flanks.  BREAST:  Deferred to office examination.  CARDIOVASCULAR:  Radial pulse 2+.  Regular tachycardia with no murmurs.  ABDOMEN:  Positive bowel sounds.  Marked tenderness to palpation at the level of the umbilicus on the right.  PELVIC:  Deferred.  EXTREMITIES:  Normal.  DATA BASE:  CT scan done at [facility] on September 06, 2000 in the morning shows an edematous left kidney and two stones as noted.  Hemoglobin 14.3, hematocrit 41, white count 7700.  Differential is pending. Sodium 137, potassium 3.9, chloride 103, CO2 26, BUN 7, creatinine 0.8, glucose 129.  ASSESSMENT AND PLAN: 1. Pyelonephritis - Patient with ongoing and progressive flank pain,    unremitting fever to 101.4, rigors in the presence of a positive UA and    edematous left kidney by CT scan.  There is no evidence of an obstructing    stone.  The patient is to be admitted to a regular medical bed.  Will    continue Cipro, converting to 400 mg IV q.12h.   Will add gentamicin with a    loading dose of 120 mg IV with pharmacy to dose thereafter. 2. Genitourinary - Patient with a history of stones, now with recurrence,    although no evidence of obstruction.  If the patients symptoms do not    respond to the above treatment rapidly, would need to consider repeat    spiral CT scan to see if her stones have moved from the renal pelvis to the    ureter. DD:  09/06/00 TD:  09/07/00 Job: [id number]

## 2011-05-11 NOTE — Op Note (Signed)
NAMEANH, BIGOS NO.:  [id number]   MEDICAL RECORD NO.:  [id number]                   PATIENT TYPE:  AMB   LOCATION:  [facility]                                  FACILITY:  [facility]   PHYSICIAN:  Randye Lobo, [name]                DATE OF BIRTH:  [birth year]   DATE OF PROCEDURE:  02/23/2004  DATE OF DISCHARGE:                                 OPERATIVE REPORT   PREOPERATIVE DIAGNOSES:  1. Right ectopic pregnancy.  2. Status post methotrexate treatment.  3. Acute abdominal pain.   POSTOPERATIVE DIAGNOSES:  1. Right ectopic pregnancy.  2. Status post methotrexate treatment.  3. Acute abdominal pain.  4. Hemoperitoneum.  5. Abdominal wall adhesions.   PROCEDURES:  1. Exploratory laparotomy.  2. Bilateral salpingectomy.  3. Removal of right ectopic pregnancy.   SURGEON:  Randye Lobo, [name]   ASSISTANT:  Miguel Aschoff, [name]   FLUIDS REPLACED:  1200 Ringer's lactate.   ESTIMATED BLOOD LOSS:  100 mL of hemoperitoneum was drained, minimal EBL  from the procedure itself.   URINE OUTPUT:  350 mL.   COMPLICATIONS:  None.   INDICATION FOR PROCEDURE:  The patient is a 45 year old gravida 4, para 2-0-  1-2, Caucasian female, status post bilateral tubal ligation and methotrexate  treatment on February 22, 2004, for a right ectopic pregnancy, who presented to  the [facility] with sudden onset of pain at 10 a.m. on  February 23, 2004, which she rated a 10/10, in addition to symptoms of dizziness  and nausea.  The patient was seen in the office late in the afternoon on  February 22, 2004, at which time she had a positive pregnancy test and a  transvaginal ultrasound documenting the absence of an intrauterine pregnancy  and a right adnexal mass measuring 1.6 cm, which was separate from the right  corpus luteum cyst.  The patient's last menstrual period was January 18, 2004.  The patient was diagnosed with an ectopic pregnancy, and a plan was  made to  proceed with methotrexate treatment as the patient had previously  had an umbilical herniorrhaphy with an abdominal wall mesh placement, making  laparoscopic removal of the salpingectomy not an option.  The patient  declined exploratory laparotomy with bilateral salpingectomy for treatment  of the ectopic pregnancy.  The patient declined any future childbearing and  reported that she would in the future proceed with another form of  contraception.   The patient received a dose of methotrexate in the evening on February 22, 2004.  She then presented on the morning of February 23, 2004, with acute abdominal  pain and was noted to have guarding on abdominal and pelvic exam.  The  hemoglobin measured 15.4.  The patient had a blood type which was A  positive.  The patient was diagnosed with a possible ruptured ectopic  pregnancy and  a recommendation was made to proceed with an exploratory  laparotomy with bilateral salpingectomy and removal of the ectopic pregnancy  after risks, benefits, and alternatives were discussed with her and her  husband.  They did choose to proceed.   FINDINGS:  100 mL of hemoperitoneum was noted in the pelvis.  The fallopian  tubes were indeed consistent with a prior bilateral tubal ligation.  There  was a right isthmic ectopic pregnancy.  There was a right corpus luteum cyst  measuring approximately 2 cm.  The left ovary was noted to be unremarkable  and the uterus was normal.   In the upper abdomen underneath the umbilicus, there were extensive  adhesions of omentum in this region consistent with the patient's previous  umbilical herniorrhaphy.  This area was gently palpated and the adhesions  were noted to be quite thickened.  The Pfannenstiel incision did not allow  for access to this region and as the patient had not remarked of any  problems related to them, they were left alone and not lysed.   SPECIMENS:  The right fallopian tube with the ectopic pregnancy was  sent to  pathology separate from the left fallopian tube.   DESCRIPTION OF PROCEDURE:  The patient was seen immediately in the maternity  admissions area, where she was examined and assessed and an IV was started  there.  The patient was then brought down to the operating room, where she  received general endotracheal anesthesia.  The abdomen was sterilely prepped  and a Foley catheter sterilely placed inside the bladder.  She was sterilely  draped.   A Pfannenstiel incision was created along the line of the patient's previous  Pfannenstiel incision, which apparently was performed for lysis of adhesions  years prior.  The incision was carried down to the fascia with a combination  of monopolar cautery and sharp dissection.  The fascia was then incised  transversely and the incision was carried out bilaterally with a Mayo  scissors.  The rectus muscles were divided from the muscle using sharp  dissection.  The rectus muscles were then divided in the midline sharply.  The parietal peritoneum was entered sharply with a Metzenbaum scissors after  it was elevated with two hemostat clamps.  The parietal peritoneum was  extended cranially and caudally using sharp dissection.  An exploration of  the pelvis was then performed and the findings are as noted above.   The hemoperitoneum was removed and a self-retaining retractor was placed in  the pelvis and the bowel was packed into the upper abdomen.  The right  ectopic pregnancy was identified.  Two Kelly clamps were placed across the  fallopian tube at its attachment to the mesosalpinx.  The right fallopian  tube and the ectopic pregnancy were sharply excised and were sent to  pathology.  A free tie of 0 Vicryl followed by a suture ligature of the same  were used to create hemostasis in this region.  The same procedure that was  performed on the patient's right-hand side was then performed on the left- hand side and the left fallopian tube was  removed.  The proximal ends of the  fallopian tubes were cauterized with monopolar cautery bilaterally.  The  surgical sites were examined after irrigation and suction, and they were  found to be hemostatic.  This completed the patient's procedure.   The lap pads and the self-retaining retractor were removed.  The peritoneum  was closed with a running suture of  2-0 Vicryl.  The rectus muscles were  reapproximated in the midline with interrupted sutures of 0 Vicryl.  The  fascia was closed with a running suture of 0 Vicryl.  The subcutaneous  tissue was irrigated and suctioned and made hemostatic with monopolar  cautery.  The skin was closed with staples and a sterile bandage was placed  over this.   This concluded the patient's procedure.  There were no complications.  All  needle, instrument, and sponge counts were correct.                                               Randye Lobo, [name]    BES/MEDQ  D:  02/23/2004  T:  02/23/2004  Job:  [id number]

## 2011-05-11 NOTE — Assessment & Plan Note (Signed)
[facility]                                 ON-CALL NOTE   NAME:ALLENCharma, Mocarski                         MRN:          [id number]  DATE:06/09/2008                            DOB:          1966/05/31    TIME:  7:13 p.m.   PHONE NUMBER:  [phone].   Dr. Lovell Sheehan is her primary doctor.   CHIEF COMPLAINT:  Medication issue.   Patient states that she takes Ativan 0.5 mg t.i.d.  She ran out of it  and called the office this morning for a refill, but they have not  refilled it yet.  She has some Xanax left over from an old prescription  and wants to know if she can substitute this if she is feeling anxious.  She is going to be missing her afternoon and evening dose today.   I told her that she can substitute the Xanax that she has already, the  0.25 mg 1-2 in place of her regular 0.5 Ativan, as needed for her dose  today and her next dose.   Call Dr. Lovell Sheehan' office back in the morning if the prescription is not  called in yet.  I advised her if she has any side effects, problems, or  anxiety worsening, to call back, and otherwise call the office in the  morning as planned.     Marne A. Tower, MD  Electronically Signed    MAT/MedQ  DD: 06/09/2008  DT: 06/09/2008  Job #: [id number]

## 2011-05-11 NOTE — Discharge Summary (Signed)
[facility]  Patient:    [name], [name]                         MRN: [id number] Adm. Date:  [id number] Disc. Date: [id number] Attending:  Duke Salvia CC:         Tobie Poet, P.A.-C., [facility], [place]   Discharge Summary  ADMISSION DIAGNOSES: 1. Pyelonephritis. 2. Nephrolithiasis.  DISCHARGE DIAGNOSES: 1. Pyelonephritis. 2. Nephrolithiasis.  CONSULTATIONS:  None.  PROCEDURES:  Spiral CT scan of the abdomen which revealed two small stones in the right renal pelvis with no signs of hydronephrosis, no sign of obstruction, and no edema of the right kidney.  HISTORY OF PRESENT ILLNESS:  The patient is a 45 year old, married white female, mother of 2, with a history of kidney stones in the past, most recent February 2000.  She was in her usual state of health when she reported onset of discomfort September 13 in the evening with fever to 101.2.  She was seen September 14 at the [place] office of [facility].  She had a positive urinalysis at that time.  She was sent for imaging at [facility] which revealed the patient to have edematous left kidney and noted to have two small stones in the collecting system of the right kidney.  There was no evidence of ureteral obstruction.  The patient was started on Cipro p.o.  She called on the night of September 14 to report ongoing fevers, rigors, and increasing back pain.  In the emergency department, she was febrile to 101.4 with obvious rigors.  She was admitted for IV antibiotics for pyelonephritis.  Please see the History & Physical for Past Medical History, Family History, Social History, and admitting examination.  ADMISSION LABORATORY DATA:  Hemoglobin 14.3, hematocrit 41, white count 7700, with 83% segs, 8% lymphs, 7% monos.  Urinalysis was positive for 21 to 50 wbcs, 21 to 50 rbcs, and many bacteria.  Electrolytes were normal.  HOSPITAL COURSE:  The patient  was continued on Cipro, switched to IV at 400 mg q.12h.  She was started on gentamicin at q.24h. dosing.  The patient did well with resolution of her fever.  White count also returned to normal with normal differential.  The patient did have ongoing and significant flank pain with occasional fever spikes, most recently 101.5 on the night of September 16. Followup CT scan noted above.  With the patient being afebrile, with a white count being to normal with normal differential, with her pain being well controlled on the morning of examination at discharge, she is felt to be ready for discharge.  DISCHARGE PHYSICAL EXAMINATION:  VITAL SIGNS:  Temperature 97 with a T-max of 101.5 at 8 p.m. on September 08, 2000.  Blood pressure 103/64.  GENERAL APPEARANCE:  An obese Caucasian woman lying in bed who is in no acute distress.  ABDOMEN:  Positive bowel sounds are noted.  Her abdomen is soft with no guarding or rebound, minimal tenderness at the right side.  FINAL LABORATORY DATA:  On September 09, 2000, hemoglobin 11.8, hematocrit 35.5, white count 5500 with 43% segs, 43% lymphs, 7% monos, 3% eosinophils, platelet count 221,000.  Discharge chemistries were normal with a glucose of 115.  DISCHARGE MEDICATIONS: 1. Augmentin 875 mg b.i.d. for 5 additional days. 2. Demerol 50 mg p.o. q.6h. p.r.n. pain. 3. Phenergan 25 mg p.o. q.6h. nausea.  DISPOSITION:  The patient is discharged home.  She may  return to work in three days.  FOLLOWUP:  The patient is to follow up with either Dr. Darryll Capers or Aline Brochure, P.A., in approximately one week for a post treatment repeat urinalysis and exam.  CONDITION UPON DISCHARGE:  At time of dictation, patients condition is stable and improved. DD:  09/09/00 TD:  09/10/00 Job: [id number]

## 2011-05-11 NOTE — H&P (Signed)
[name], [name] NO.:  [id number]   MEDICAL RECORD NO.:  [id number]           PATIENT TYPE:   LOCATION:                                 FACILITY:   PHYSICIAN:  Gordy Savers, MD    DATE OF BIRTH:   DATE OF ADMISSION:  10/08/2006  DATE OF DISCHARGE:                                HISTORY & PHYSICAL   CHIEF COMPLAINT:  Abdominal pain.   HISTORY OF PRESENT ILLNESS:  The patient is a 45 year old white female with  history of nephrolithiasis. She had the onset of some mild right lower  quadrant discomfort yesterday, but this afternoon had the onset of very  intense pain.  This was associated with nausea and diaphoresis. She states  the pain is constant with paroxysms of more severe pain. She is now admitted  for further evaluation treatment of suspected renal colic.   PAST MEDICAL HISTORY:  She was last hospitalized in March 2005 for a right  ectopic pregnancy. At that time, she had exploratory laparotomy with  bilateral salpingostomy. In September 2001 was hospitalized for  pyelonephritis. She has history of nephrolithiasis and underwent stone  manipulation in 2001. She had lithotripsy performed in 2001 and 2002.  She  had remote cholecystectomy in 1999.  She is a gravida 2, para 2, aorta 0.  She has history obesity and a prior umbilical hernia repair in 2000.   FAMILY HISTORY:  Positive coronary artery disease.  Father had MI, and a  grandfather also with coronary artery disease.  Maternal grandmother had  breast cancer.  Father and brother had kidney stones.   SOCIAL HISTORY:  The patient is a high school graduate with 1 year of  business school. She has been married for approximately 15 years and has two  sons.  She is a one-pack-per-day smoker.   PHYSICAL EXAMINATION:  GENERAL:  Exam revealed a middle-aged white female in  considerable distress due to pain.  VITAL SIGNS: Blood pressure is 118/90, pulse rate 110, temperature 97.1.  HEAD AND NECK:  Revealed normal pupillary responses.  Conjunctiva clear.  Oropharynx benign.  Mucous membranes well-hydrated.  NECK: No adenopathy or bruits.  CHEST: Was clear,  CARDIOVASCULAR: Exam unremarkable except for tachycardia.  ABDOMEN:  Revealed moderate obesity. She had mild tenderness in suprapubic  and right lower quadrant.  There is no guarding or rebound tenderness.  Bowel sounds were active.  EXTREMITIES:  Unremarkable without edema.  Peripheral pulses were intact.   IMPRESSION:  1. Right renal colic.  2. History nephrolithiasis.   DISPOSITION:  The patient was admitted to hospital.  She will be supported  with  IV fluids and parenteral analgesics. A CT urogram will be reviewed,  and the patient likely will need a neurology consult.           ______________________________  Gordy Savers, MD     PFK/MEDQ  D:  10/08/2006  T:  10/09/2006  Job:  [id number]

## 2011-05-11 NOTE — Op Note (Signed)
[place]. [facility]  Patient:    [name]                          MRN: [id number] Proc. Date: 07/23/00 Adm. Date:  [id number] Attending:  Sypher, Douglass Rivers CC:         [name], [name], [name] (2)  Stacie Glaze, [name] [facility]   Operative Report  PREOPERATIVE DIAGNOSIS:  Painful subretinacular ganglion cyst, dorsal aspect, right wrist.  POSTOPERATIVE DIAGNOSIS:  Painful subretinacular ganglion cyst, dorsal aspect, right wrist.  OPERATION PERFORMED:  Resection of right wrist dorsal subretinacular ganglion with capsular repair.  SURGEON:  [name], [name], [name]  ASSISTANT:  Jonni Sanger, P.A.  ANESTHESIA:  IV regional.  SUPERVISING ANESTHESIOLOGIST:  Dr. Diamantina Monks.  INDICATIONS FOR PROCEDURE:  [name] is a 45 year old right-handed woman who has had a painful mass on the dorsal aspect of her right wrist for many months.  She presented for hand surgery consult at the request of her primary physician, Dr. Lovell Sheehan and was noted to have a large dorsal ganglion.  She had mechanical symptoms of compression of the posterior interosseous nerve.  After informed consent, she requested excision of the cyst for pain control.  DESCRIPTION OF PROCEDURE:  [name] was brought to the operating room and placed in supine position on the operating table.  Following placement of IV regional block, the right arm was prepped with Betadine soap and solution and sterilely draped.  When anesthesia was satisfactory, the procedure commenced with a short transverse incision directly over the palpable mass.  The subcutaneous tissues were carefully divided taking care to identify and gently spare the extensor retinaculum.  This was split in the line of its fibers and the cyst circumferentially dissected down to the capsule.  The cyst was then drained and removed with rongeurs.  The stalk was followed to the dorsal aspect of the scapholunate  interosseous ligament.  The ligament was cleaned with rongeurs and the exit point electrocauterized with bipolar current.  Capsulotomy was repaired with a mattress suture of 4-0 Vicryl.  The wound was then irrigated and repaired with intradermal 3-0 Prolene followed by Steri-Strips.  Compressive dressing was applied with a volar plaster splint maintaining the wrist in 5 degrees dorsiflexion.  For aftercare, [name] is given a prescription for Percocet 5/325 one or two tablets p.o. q.4-6h. p.r.n. pain.  30 tablets without refill.  Also Keflex 500 mg 1 p.o. q.8h. x 4 days as a prophylactic antibiotic.  DD:  07/23/00 TD:  07/24/00 Job: [id number]

## 2011-05-11 NOTE — Discharge Summary (Signed)
NAMESHARRYN, BELDING NO.:  [id number]   MEDICAL RECORD NO.:  [id number]          PATIENT TYPE:  INP   LOCATION:  9305                          FACILITY:  [facility]   PHYSICIAN:  Randye Lobo, [name]   DATE OF BIRTH:  05-13-1966   DATE OF ADMISSION:  09/02/2007  DATE OF DISCHARGE:  09/04/2007                               DISCHARGE SUMMARY   ADMISSION DIAGNOSIS:  1. Dysmenorrhea.  2. Chronic pelvic pain.   DISCHARGE DIAGNOSIS:  1. Dysmenorrhea.  2. Chronic pelvic pain.  3. Minimal endometriosis.  4. Hemorrhagic left ovarian cyst.  5. Omental umbilical adhesions, history of prior umbilical      herniorrhaphy.  6. Status post total abdominal hysterectomy with bilateral      oophorectomy and omental lysis of adhesions.   SIGNIFICANT OPERATIONS AND PROCEDURES:  The patient underwent a total  abdominal hysterectomy with bilateral oophorectomy and omental lysis of  adhesions on September 02, 2007 at the [facility] [place]  under the direction of Dr. Conley Simmonds with the assistance of Dr. Lodema Hong.   ADMISSION HISTORY AND PHYSICAL EXAM:  t  The patient is a 45 year old gravida 4, para 0-0-2-0 Caucasian female,  status post laparotomy with bilateral salpingectomy in 2005 for a  ruptured ectopic pregnancy subsequent to a prior bilateral tubal  ligation years previously, who presented to the office reporting chronic  pain with menses and chronic pelvic pain.  The patient also had a  history of dyspareunia and ovulatory pain.  The patient had had a long  history of abdominal pain, prompting multiple CT scans of the abdomen  and pelvis which were unremarkable with the exception of a left kidney  stone measuring 3 cm in diameter.  The patient had previous urologic  consultation with Dr. Gaynelle Arabian and had a normal cystoscopy.  The  patient had also had a gastroenterology consultation with Dr. Christella Hartigan  from the [facility].  She underwent a colonoscopy with  polypectomy of  a benign polyp.   The patient had been requiring narcotic therapy to treat her pain along  with Toradol.  The patient declined any future childbearing, and she  desired hysterectomy with removal of her ovaries.  The patient is a  smoker and not a candidate for oral contraceptive treatment.  Abdominal  examination is significant for a well-healed Pfannenstiel incision  without evidence of herniation.  The patient does have an umbilical  incision as well without evidence of herniation.  On pelvic exam the  patient had a normal cervix and vagina.  The uterus was small and  nontender.  No adnexal masses nor tenderness were appreciated.   HOSPITAL COURSE:  The patient was admitted on September 02, 2007 at which  time she underwent a total abdominal hysterectomy with bilateral  oophorectomy and omental lysis of adhesions.  There were dense adhesions  which were adherent to the infraumbilical region at the site of a graft  which had been previously placed for an umbilical herniorrhaphy.  A  portion of these adhesions were lysed, but not all could be  reached.  There was no evidence of any bowel adherent to this infraumbilical  lesion.  The uterus was unremarkable.  The fallopian tubes were absent,  and there was a small area of endometriosis on the right ovary, and in  the right broad ligament.  The left ovary contained a bilobed  hemorrhagic cyst.  Otherwise, there was no evidence of any endometriosis  nor adhesive disease.   The patient's surgery was uncomplicated as was her postoperative  recovery.  The patient's pain was treated postoperatively with a high  dose Dilaudid PCA and Toradol.  After these regimens were discontinued,  on postop day #1, the patient was requiring significant quantities of  oral Dilaudid to control her discomfort.  Her abdomen remained soft and  nontender, and without evidence of any ileus.  Her incisions remained  intact.   The patient's Foley  catheter was removed on postop day #1, and she was  able to void spontaneously.  The patient received both TED hose and PAS  stockings for DVT prophylaxis during her hospitalization.  The patient  was treated with a Climara estrogen patch of 0.1 mg to treat menopausal  symptoms.  She also received a nicotine patch to assist with smoking  cessation during her hospitalization.   The patient was able to ambulate independently prior to discharge.  Her  discharge hemoglobin was 12.8, and she was tolerating this well.  Her  final pathology report was pending at the time of her discharge.  The  patient was found to be in good condition and ready for discharge on  postop day #2.   DISCHARGE INSTRUCTIONS AS FOLLOWS:  1. Discharged to home.  2. The patient will take the following medications.      a.     Dilaudid 3 mg one p.o. q.4 h. p.r.n. pain.      b.     Ibuprofen 600 mg one p.o. q.6 h. p.r.n. pain.      c.     Climara estrogen patch 0.1 mg to skin weekly.      d.     Nicoderm patch 21 mg apply to skin daily and take off at       bedtime for 8 hours.  3. The patient will follow a regular diet.  4. The patient will have decreased activity for the next 6 weeks, and      she will not drive for 2 weeks.  5. The patient will follow up in the office the following day for      staple removal; and then for the last line the patient will call if      she experiences any fever, nausea and vomiting, pain uncontrolled      by her medication, active vaginal bleeding, redness or drainage      from the incision, or any other concern.      Randye Lobo, [name]  Electronically Signed     BES/MEDQ  D:  10/02/2007  T:  10/02/2007  Job:  [id number]

## 2011-10-05 LAB — BASIC METABOLIC PANEL
BUN: 7
BUN: 8
CO2: 26
CO2: 28
Calcium: 8.4
Calcium: 8.7
Chloride: 101
Chloride: 102
Creatinine, Ser: 0.65
Creatinine, Ser: 1.01
GFR calc Af Amer: >60
GFR calc Af Amer: >60
GFR calc non Af Amer: >60
GFR calc non Af Amer: >60
Glucose, Bld: 134 — ABNORMAL HIGH
Glucose, Bld: 94
Potassium: 4
Potassium: 4.8
Sodium: 132 — ABNORMAL LOW
Sodium: 137

## 2011-10-05 LAB — CBC
HCT: 36.3
HCT: 39.5
Hemoglobin: 12.8
Hemoglobin: 14
MCHC: 35.3
MCHC: 35.4
MCV: 87
MCV: 88.5
Platelets: 243
Platelets: 248
RBC: 4.1
RBC: 4.55
RDW: 13.2
RDW: 13.2
WBC: 10.1
WBC: 16.1 — ABNORMAL HIGH

## 2011-10-05 LAB — URINALYSIS, ROUTINE W REFLEX MICROSCOPIC
Bilirubin Urine: NEGATIVE
Glucose, UA: NEGATIVE
Ketones, ur: NEGATIVE
Leukocytes, UA: NEGATIVE
Nitrite: NEGATIVE
Protein, ur: NEGATIVE
Specific Gravity, Urine: 1.01
Urobilinogen, UA: 0.2
pH: 7

## 2011-10-05 LAB — URINE MICROSCOPIC-ADD ON

## 2011-10-08 LAB — DIFFERENTIAL
Basophils Absolute: 0.1
Basophils Relative: 1
Eosinophils Absolute: 0.1
Eosinophils Relative: 1
Lymphocytes Relative: 33
Lymphs Abs: 3.2
Monocytes Absolute: 0.8 — ABNORMAL HIGH
Monocytes Relative: 8
Neutro Abs: 5.6
Neutrophils Relative %: 57

## 2011-10-08 LAB — BASIC METABOLIC PANEL
BUN: 9
CO2: 24
Calcium: 8.9
Chloride: 102
Creatinine, Ser: 0.72
GFR calc Af Amer: >60
GFR calc non Af Amer: >60
Glucose, Bld: 101 — ABNORMAL HIGH
Potassium: 3.7
Sodium: 134 — ABNORMAL LOW

## 2011-10-08 LAB — URINALYSIS, ROUTINE W REFLEX MICROSCOPIC
Bilirubin Urine: NEGATIVE
Glucose, UA: NEGATIVE
Ketones, ur: NEGATIVE
Leukocytes, UA: NEGATIVE
Nitrite: NEGATIVE
Protein, ur: NEGATIVE
Specific Gravity, Urine: 1.007
Urobilinogen, UA: 0.2
pH: 7

## 2011-10-08 LAB — CBC
HCT: 39.1
Hemoglobin: 13.5
MCHC: 34.5
MCV: 87.2
Platelets: 250
RBC: 4.49
RDW: 13.2
WBC: 9.8

## 2011-10-08 LAB — URINE MICROSCOPIC-ADD ON

## 2011-10-08 LAB — PREGNANCY, URINE: Preg Test, Ur: NEGATIVE

## 2014-01-06 ENCOUNTER — Emergency Department (HOSPITAL_COMMUNITY)
Admission: EM | Admit: 2014-01-06 | Discharge: 2014-01-06 | Disposition: A | Discharge status: Home / Self Care | Payer: BC Managed Care – PPO | Attending: Emergency Medicine | Admitting: Emergency Medicine

## 2014-01-06 ENCOUNTER — Encounter (HOSPITAL_COMMUNITY): Payer: Self-pay | Admitting: Emergency Medicine

## 2014-01-06 DIAGNOSIS — Y990 Civilian activity done for income or pay: Secondary | ICD-10-CM | POA: Insufficient documentation

## 2014-01-06 DIAGNOSIS — Z87891 Personal history of nicotine dependence: Secondary | ICD-10-CM | POA: Insufficient documentation

## 2014-01-06 DIAGNOSIS — S0180XA Unspecified open wound of other part of head, initial encounter: Secondary | ICD-10-CM | POA: Insufficient documentation

## 2014-01-06 DIAGNOSIS — Z23 Encounter for immunization: Secondary | ICD-10-CM | POA: Insufficient documentation

## 2014-01-06 DIAGNOSIS — Y9289 Other specified places as the place of occurrence of the external cause: Secondary | ICD-10-CM | POA: Insufficient documentation

## 2014-01-06 DIAGNOSIS — Y9389 Activity, other specified: Secondary | ICD-10-CM | POA: Insufficient documentation

## 2014-01-06 DIAGNOSIS — S01111A Laceration without foreign body of right eyelid and periocular area, initial encounter: Secondary | ICD-10-CM

## 2014-01-06 DIAGNOSIS — IMO0002 Reserved for concepts with insufficient information to code with codable children: Secondary | ICD-10-CM | POA: Insufficient documentation

## 2014-01-06 MED ORDER — TETANUS-DIPHTH-ACELL PERTUSSIS 5-2.5-18.5 LF-MCG/0.5 IM SUSP
0.5000 mL | Freq: Once | INTRAMUSCULAR | Status: AC
  Administered 2014-01-06: 0.5 mL via INTRAMUSCULAR
  Filled 2014-01-06: qty 0.5

## 2014-01-06 NOTE — ED Provider Notes (Signed)
CSN: [id number]     Arrival date & time 01/06/14  1356 History  This chart was scribed for non-physician practitioner, Idalia Needle. Joelyn Oms, PA-C working with Tanna Furry, MD by Frederich Balding, ED scribe. This patient was seen in room WTR8/WTR8 and the patient's care was started at 3:02 PM.   Chief Complaint  Patient presents with  . Facial Laceration   The history is provided by the patient. No language interpreter was used.   HPI Comments: [name] is a 48 y.o. female who presents to the Emergency Department complaining of a laceration to her right eyebrow that occurred earlier today. She states she was stretching an elastic belt at work and hit her in the face. Denies LOC. Denies visual disturbance. Pt's last tetanus was over 10 years ago.   History reviewed. No pertinent past medical history. Past Surgical History  Procedure Laterality Date  . Shoulder surgery    . Abdominal hysterectomy    . Ectopic pregnancy surgery    . Cholecystectomy    . Hernia repair     History reviewed. No pertinent family history. History  Substance Use Topics  . Smoking status: Former Research scientist (life sciences)  . Smokeless tobacco: Not on file  . Alcohol Use: No   OB History   Grav Para Term Preterm Abortions TAB SAB Ect Mult Living                 Review of Systems  Eyes: Negative for visual disturbance.  Skin: Positive for wound.  All other systems reviewed and are negative.   Allergies  Desvenlafaxine and Iohexol  Home Medications   Current Outpatient Rx  Name  Route  Sig  Dispense  Refill  . ibuprofen (ADVIL,MOTRIN) 200 MG tablet   Oral   Take 800 mg by mouth every 6 (six) hours as needed.          BP 134/71  Pulse 81  Temp(Src) 97.8 F (36.6 C) (Oral)  Resp 20  SpO2 100%  Physical Exam  Nursing note and vitals reviewed. Constitutional: She is oriented to person, place, and time. She appears well-developed and well-nourished. No distress.  HENT:  Head: Normocephalic.  Right Ear:  External ear normal.  Nose: Nose normal.  Mouth/Throat: Oropharynx is clear and moist. No oropharyngeal exudate.  1cm superficial abrasion to right eyebrow  Eyes: Conjunctivae and EOM are normal. Pupils are equal, round, and reactive to light. No scleral icterus.  Neck: Normal range of motion. Neck supple. No spinous process tenderness and no muscular tenderness present.  Pulmonary/Chest: Effort normal.  Musculoskeletal: Normal range of motion. She exhibits no edema and no tenderness.  Lymphadenopathy:    She has no cervical adenopathy.  Neurological: She is alert and oriented to person, place, and time. She exhibits normal muscle tone. Coordination normal.  Skin: Skin is warm and dry. No rash noted. No erythema. No pallor.  Psychiatric: She has a normal mood and affect. Her behavior is normal. Judgment and thought content normal.    ED Course  Procedures (including critical care time)  DIAGNOSTIC STUDIES: Oxygen Saturation is 100% on RA, normal by my interpretation.    COORDINATION OF CARE: 3:04 PM-Discussed treatment plan which includes updating tetanus and dermabond with pt at bedside and pt agreed to plan.   Labs Review Labs Reviewed - No data to display Imaging Review No results found.  EKG Interpretation   None       MDM  Right eyebrow laceration  Patient here with non-suturable  laceration to right eyebrow.  Dressing applied, this is more an abrasion rather than true laceration as I am unable to pull apart the edges.  Tetanus updated.  I personally performed the services described in this documentation, which was scribed in my presence. The recorded information has been reviewed and is accurate.   Idalia Needle Joelyn Oms, [place] 01/06/14 [phone]

## 2014-01-06 NOTE — ED Notes (Signed)
Pt was stretching elastic band at work.  Belt came back to hit patient in rt eyebrow.  No visual strike.

## 2014-01-06 NOTE — Discharge Instructions (Signed)
Facial Laceration ° A facial laceration is a cut on the face. These injuries can be painful and cause bleeding. Lacerations usually heal quickly, but they need special care to reduce scarring. °DIAGNOSIS  °Your health care provider will take a medical history, ask for details about how the injury occurred, and examine the wound to determine how deep the cut is. °TREATMENT  °Some facial lacerations may not require closure. Others may not be able to be closed because of an increased risk of infection. The risk of infection and the chance for successful closure will depend on various factors, including the amount of time since the injury occurred. °The wound may be cleaned to help prevent infection. If closure is appropriate, pain medicines may be given if needed. Your health care provider will use stitches (sutures), wound glue (adhesive), or skin adhesive strips to repair the laceration. These tools bring the skin edges together to allow for faster healing and a better cosmetic outcome. If needed, you may also be given a tetanus shot. °HOME CARE INSTRUCTIONS °· Only take over-the-counter or prescription medicines as directed by your health care provider. °· Follow your health care provider's instructions for wound care. These instructions will vary depending on the technique used for closing the wound. °For Sutures: °· Keep the wound clean and dry.   °· If you were given a bandage (dressing), you should change it at least once a day. Also change the dressing if it becomes wet or dirty, or as directed by your health care provider.   °· Wash the wound with soap and water 2 times a day. Rinse the wound off with water to remove all soap. Pat the wound dry with a clean towel.   °· After cleaning, apply a thin layer of the antibiotic ointment recommended by your health care provider. This will help prevent infection and keep the dressing from sticking.   °· You may shower as usual after the first 24 hours. Do not soak the  wound in water until the sutures are removed.   °· Get your sutures removed as directed by your health care provider. With facial lacerations, sutures should usually be taken out after 4 5 days to avoid stitch marks.   °· Wait a few days after your sutures are removed before applying any makeup. °For Skin Adhesive Strips: °· Keep the wound clean and dry.   °· Do not get the skin adhesive strips wet. You may bathe carefully, using caution to keep the wound dry.   °· If the wound gets wet, pat it dry with a clean towel.   °· Skin adhesive strips will fall off on their own. You may trim the strips as the wound heals. Do not remove skin adhesive strips that are still stuck to the wound. They will fall off in time.   °For Wound Adhesive: °· You may briefly wet your wound in the shower or bath. Do not soak or scrub the wound. Do not swim. Avoid periods of heavy sweating until the skin adhesive has fallen off on its own. After showering or bathing, gently pat the wound dry with a clean towel.   °· Do not apply liquid medicine, cream medicine, ointment medicine, or makeup to your wound while the skin adhesive is in place. This may loosen the film before your wound is healed.   °· If a dressing is placed over the wound, be careful not to apply tape directly over the skin adhesive. This may cause the adhesive to be pulled off before the wound is healed.   °·   Avoid prolonged exposure to sunlight or tanning lamps while the skin adhesive is in place. °· The skin adhesive will usually remain in place for 5 10 days, then naturally fall off the skin. Do not pick at the adhesive film.   °After Healing: °Once the wound has healed, cover the wound with sunscreen during the day for 1 full year. This can help minimize scarring. Exposure to ultraviolet light in the first year will darken the scar. It can take 1 2 years for the scar to lose its redness and to heal completely.  °SEEK IMMEDIATE MEDICAL CARE IF: °· You have redness, pain, or  swelling around the wound.   °· You see a yellowish-white fluid (pus) coming from the wound.   °· You have chills or a fever.   °MAKE SURE YOU: °· Understand these instructions. °· Will watch your condition. °· Will get help right away if you are not doing well or get worse. °Document Released: 01/17/2005 Document Revised: 09/30/2013 Document Reviewed: 07/23/2013 °[employer]® Patient Information ©2014 [employer]. ° °

## 2014-01-09 NOTE — ED Provider Notes (Signed)
Medical screening examination/treatment/procedure(s) were performed by non-physician practitioner and as supervising physician I was immediately available for consultation/collaboration.  EKG Interpretation   None         Tanna Furry, MD 01/09/14 [phone]

## 2014-04-05 ENCOUNTER — Emergency Department (HOSPITAL_COMMUNITY): Payer: 59

## 2014-04-05 ENCOUNTER — Emergency Department (HOSPITAL_COMMUNITY)
Admission: EM | Admit: 2014-04-05 | Discharge: 2014-04-06 | Disposition: A | Discharge status: Home / Self Care | Payer: 59 | Attending: Emergency Medicine | Admitting: Emergency Medicine

## 2014-04-05 ENCOUNTER — Encounter (HOSPITAL_COMMUNITY): Payer: Self-pay | Admitting: Emergency Medicine

## 2014-04-05 DIAGNOSIS — Z87891 Personal history of nicotine dependence: Secondary | ICD-10-CM | POA: Insufficient documentation

## 2014-04-05 DIAGNOSIS — R51 Headache: Secondary | ICD-10-CM | POA: Insufficient documentation

## 2014-04-05 DIAGNOSIS — M62838 Other muscle spasm: Secondary | ICD-10-CM | POA: Insufficient documentation

## 2014-04-05 DIAGNOSIS — Z9889 Other specified postprocedural states: Secondary | ICD-10-CM | POA: Insufficient documentation

## 2014-04-05 LAB — BASIC METABOLIC PANEL
BUN: 14 mg/dL (ref 6–23)
CO2: 26 mEq/L (ref 19–32)
Calcium: 9.4 mg/dL (ref 8.4–10.5)
Chloride: 104 mEq/L (ref 96–112)
Creatinine, Ser: 0.7 mg/dL (ref 0.50–1.10)
GFR calc Af Amer: >90 mL/min (ref >90)
GFR calc non Af Amer: >90 mL/min (ref >90)
Glucose, Bld: 91 mg/dL (ref 70–99)
Potassium: 4.3 mEq/L (ref 3.7–5.3)
Sodium: 142 mEq/L (ref 137–147)

## 2014-04-05 LAB — CBC WITH DIFFERENTIAL/PLATELET
Basophils Absolute: 0 10*3/uL (ref 0.0–0.1)
Basophils Relative: 0 % (ref 0–1)
Eosinophils Absolute: 0.1 10*3/uL (ref 0.0–0.7)
Eosinophils Relative: 1 % (ref 0–5)
HCT: 38.1 % (ref 36.0–46.0)
Hemoglobin: 13 g/dL (ref 12.0–15.0)
Lymphocytes Relative: 42 % (ref 12–46)
Lymphs Abs: 3 10*3/uL (ref 0.7–4.0)
MCH: 29.3 pg (ref 26.0–34.0)
MCHC: 34.1 g/dL (ref 30.0–36.0)
MCV: 86 fL (ref 78.0–100.0)
Monocytes Absolute: 0.6 10*3/uL (ref 0.1–1.0)
Monocytes Relative: 8 % (ref 3–12)
Neutro Abs: 3.5 10*3/uL (ref 1.7–7.7)
Neutrophils Relative %: 49 % (ref 43–77)
Platelets: 215 10*3/uL (ref 150–400)
RBC: 4.43 MIL/uL (ref 3.87–5.11)
RDW: 13.7 % (ref 11.5–15.5)
WBC: 7.2 10*3/uL (ref 4.0–10.5)

## 2014-04-05 MED ORDER — IBUPROFEN 800 MG PO TABS
800.0000 mg | ORAL_TABLET | Freq: Once | ORAL | Status: AC
  Administered 2014-04-05: 800 mg via ORAL
  Filled 2014-04-05: qty 1

## 2014-04-05 MED ORDER — HYDROCODONE-ACETAMINOPHEN 5-325 MG PO TABS
2.0000 | ORAL_TABLET | Freq: Once | ORAL | Status: AC
  Administered 2014-04-05: 2 via ORAL
  Filled 2014-04-05: qty 2

## 2014-04-05 MED ORDER — IOHEXOL 350 MG/ML SOLN
50.0000 mL | Freq: Once | INTRAVENOUS | Status: AC | PRN
  Administered 2014-04-05: 100 mL via INTRAVENOUS

## 2014-04-05 NOTE — ED Notes (Signed)
Patient still off the unit for testing 

## 2014-04-05 NOTE — ED Notes (Signed)
Nursing redrew missing lab.

## 2014-04-05 NOTE — ED Notes (Signed)
Called lab to verify the received new sample for sedimentation rate result and spoke to [place].  They have received, and will run ASAP, but advise it takes 1 hour to result.

## 2014-04-05 NOTE — ED Notes (Signed)
[name] says the med-tech is not available to ask about missing lab.

## 2014-04-05 NOTE — ED Notes (Signed)
Patient transported to CT 

## 2014-04-05 NOTE — ED Notes (Signed)
Pt in c/o right sided neck pain that is worse with movement that radiates into her right ear and right base of head, states area feels swollen and is giving her a headache, pain worse with movement, denies sore throat

## 2014-04-05 NOTE — ED Notes (Signed)
Patient is back in room from testing.

## 2014-04-05 NOTE — ED Provider Notes (Signed)
CSN: [id number]     Arrival date & time 04/05/14  1742 History   First MD Initiated Contact with Patient 04/05/14 2021     Chief Complaint  Patient presents with  . Neck Pain  . Headache     (Consider location/radiation/quality/duration/timing/severity/associated sxs/prior Treatment) HPI Comments: Patient reports a 5 day history of right-sided neck pain that radiates up to her ear and base of her head. This gradually gotten worse. The pain is causing her to have a headache. It is worse with palpation and movement. She denies any fall or trauma. No focal weakness, numbness or tingling. No fever. No vision change. No nausea, vomiting, chest pain or shortness of breath. She's been taking anti-inflammatories, Tylenol half packs without relief.  The history is provided by the patient.    History reviewed. No pertinent past medical history. Past Surgical History  Procedure Laterality Date  . Shoulder surgery    . Abdominal hysterectomy    . Ectopic pregnancy surgery    . Cholecystectomy    . Hernia repair     History reviewed. No pertinent family history. History  Substance Use Topics  . Smoking status: Former Research scientist (life sciences)  . Smokeless tobacco: Not on file  . Alcohol Use: No   OB History   Grav Para Term Preterm Abortions TAB SAB Ect Mult Living                 Review of Systems  Constitutional: Negative for fever, activity change and appetite change.  HENT: Negative for congestion and rhinorrhea.   Respiratory: Negative for cough, chest tightness and shortness of breath.   Cardiovascular: Negative for chest pain.  Gastrointestinal: Negative for nausea, vomiting and abdominal pain.  Genitourinary: Negative for dysuria and hematuria.  Musculoskeletal: Positive for neck pain. Negative for arthralgias and myalgias.  Skin: Negative for rash.  Neurological: Positive for headaches. Negative for weakness.  A complete 10 system review of systems was obtained and all systems are negative  except as noted in the HPI and PMH.      Allergies  Desvenlafaxine and Iohexol  Home Medications   Current Outpatient Rx  Name  Route  Sig  Dispense  Refill  . ibuprofen (ADVIL,MOTRIN) 200 MG tablet   Oral   Take 800 mg by mouth every 6 (six) hours as needed for moderate pain.          . cyclobenzaprine (FLEXERIL) 10 MG tablet   Oral   Take 1 tablet (10 mg total) by mouth 2 (two) times daily as needed for muscle spasms.   20 tablet   0   . HYDROcodone-acetaminophen (NORCO/VICODIN) 5-325 MG per tablet   Oral   Take 2 tablets by mouth every 4 (four) hours as needed.   10 tablet   0   . ibuprofen (ADVIL,MOTRIN) 800 MG tablet   Oral   Take 1 tablet (800 mg total) by mouth 3 (three) times daily.   21 tablet   0    BP 103/61  Pulse 64  Temp(Src) 98.6 F (37 C) (Oral)  Resp 18  SpO2 97% Physical Exam  Constitutional: She is oriented to person, place, and time. She appears well-developed and well-nourished. No distress.  HENT:  Head: Normocephalic and atraumatic.  Mouth/Throat: Oropharynx is clear and moist. No oropharyngeal exudate.  No temporal artery tenderness  Eyes: Conjunctivae and EOM are normal. Pupils are equal, round, and reactive to light.  Neck: Normal range of motion. Neck supple.  No meningismus Right paraspinal  lumbar tenderness to palpation. No carotid bruit  Cardiovascular: Normal rate, regular rhythm and normal heart sounds.   Pulmonary/Chest: Effort normal and breath sounds normal. No respiratory distress.  Abdominal: Soft. There is no tenderness. There is no rebound and no guarding.  Musculoskeletal: Normal range of motion. She exhibits no edema and no tenderness.  Neurological: She is alert and oriented to person, place, and time. No cranial nerve deficit. She exhibits normal muscle tone. Coordination normal.  CN 2-12 intact, no ataxia on finger to nose, no nystagmus, 5/5 strength throughout, no pronator drift, Romberg negative, normal gait.    Skin: Skin is warm.    ED Course  Procedures (including critical care time) Labs Review Labs Reviewed  SEDIMENTATION RATE - Abnormal; Notable for the following:    Sed Rate 23 (*)    All other components within normal limits  CBC WITH DIFFERENTIAL  BASIC METABOLIC PANEL   Imaging Review Ct Angio Head W/cm &/or Wo Cm  04/05/2014   CLINICAL DATA:  Right neck pain.  EXAM: CT ANGIOGRAPHY HEAD AND NECK  TECHNIQUE: Multidetector CT imaging of the head and neck was performed using the standard protocol during bolus administration of intravenous contrast. Multiplanar CT image reconstructions and MIPs were obtained to evaluate the vascular anatomy. Carotid stenosis measurements (when applicable) are obtained utilizing NASCET criteria, using the distal internal carotid diameter as the denominator.  CONTRAST:  175m OMNIPAQUE IOHEXOL 350 MG/ML SOLN  COMPARISON:  DG CERVICAL SPINE COMPLETE dated 04/05/2014  FINDINGS: CTA HEAD FINDINGS  The ventricles and sulci are normal. No intraparenchymal hemorrhage, mass effect nor midline shift. No acute large vascular territory infarcts.  No abnormal extra-axial fluid collections. Basal cisterns are patent. No abnormal intraparenchymal nor leptomeningeal enhancement.  Scalloping of the inner table of the right frontal calvarium with 2.9 mm extra-axial cyst likely reflecting arachnoid cyst. Left occipital exostosis from the outer table. Partially calcified left occipital scalp mass measuring 17 mm likely reflects calcified sebaceous cyst. No skull fracture. The included ocular globes and orbital contents are non-suspicious. Small right maxillary mucosal retention cysts without paranasal sinus air-fluid levels. Under pneumatized left mastoid air cells with trace effusion versus underpneumatization.  Delayed bolus timing, arterial structures are nearly isodense to the veins.  Anterior circulation: Normal appearance of the cervical internal carotid arteries, petrous, cavernous  and supra clinoid internal carotid arteries. Widely patent anterior communicating artery. Normal appearance of the anterior and middle cerebral arteries.  Posterior circulation: Normal appearance of the vertebral arteries, vertebrobasilar junction and basilar artery, as well as main branch vessels. Small bilateral posterior communicating arteries are present. Normal appearance of posterior cerebral arteries.  No large vessel occlusion, hemodynamically significant stenosis, dissection, luminal irregularity, contrast extravasation or aneurysm within the anterior nor posterior circulation.  Review of the MIP images confirms the above findings.  CTA NECK FINDINGS  Two vessel aortic arch is a normal variant. Origins of the bilateral Common carotid arteries are widely patent. Bilateral Common carotid arteries are normal course and caliber, the level the bifurcation which is unremarkable. Normal appearance of the internal carotid arteries bilaterally, no hemodynamically significant stenosis by NASCET criteria.  The origin of the left vertebral artery is somewhat obscured by streak artifact from retained contrast in the left subclavian vein. Right carotid artery appears slightly dominant. The bilateral vertebral arteries are normal in course and caliber, with homogeneous contrast opacification.  No dissection, pseudoaneurysm, suspicious luminal irregularity or contrast extravasation.  Broad reversed cervical lordosis, without fracture deformity or malalignment on  the submitted soft tissue windows.  Review of the MIP images confirms the above findings.  IMPRESSION: CT head:  No acute intracranial process.  CTA head: No acute intracranial vascular injury, no vascular etiology to explain right pain.  CTA neck: No acute vascular injury in the neck or vascular etiology to explain right neck pain.   Electronically Signed   By: Elon Alas   On: 04/05/2014 23:43   Dg Cervical Spine Complete  04/05/2014   CLINICAL DATA:   Neck pain.  EXAM: CERVICAL SPINE  4+ VIEWS  COMPARISON:  None.  FINDINGS: No fracture, subluxation or soft tissue swelling is identified. Alignment of cervical vertebral bodies is normal. No significant degenerative changes or disc space narrowing. No bony lesions are seen.  IMPRESSION: Negative cervical spine radiographs.   Electronically Signed   By: Aletta Edouard [name]   On: 04/05/2014 19:05   Ct Angio Neck W/cm &/or Wo/cm  04/05/2014   CLINICAL DATA:  Right neck pain.  EXAM: CT ANGIOGRAPHY HEAD AND NECK  TECHNIQUE: Multidetector CT imaging of the head and neck was performed using the standard protocol during bolus administration of intravenous contrast. Multiplanar CT image reconstructions and MIPs were obtained to evaluate the vascular anatomy. Carotid stenosis measurements (when applicable) are obtained utilizing NASCET criteria, using the distal internal carotid diameter as the denominator.  CONTRAST:  131m OMNIPAQUE IOHEXOL 350 MG/ML SOLN  COMPARISON:  DG CERVICAL SPINE COMPLETE dated 04/05/2014  FINDINGS: CTA HEAD FINDINGS  The ventricles and sulci are normal. No intraparenchymal hemorrhage, mass effect nor midline shift. No acute large vascular territory infarcts.  No abnormal extra-axial fluid collections. Basal cisterns are patent. No abnormal intraparenchymal nor leptomeningeal enhancement.  Scalloping of the inner table of the right frontal calvarium with 2.9 mm extra-axial cyst likely reflecting arachnoid cyst. Left occipital exostosis from the outer table. Partially calcified left occipital scalp mass measuring 17 mm likely reflects calcified sebaceous cyst. No skull fracture. The included ocular globes and orbital contents are non-suspicious. Small right maxillary mucosal retention cysts without paranasal sinus air-fluid levels. Under pneumatized left mastoid air cells with trace effusion versus underpneumatization.  Delayed bolus timing, arterial structures are nearly isodense to the veins.   Anterior circulation: Normal appearance of the cervical internal carotid arteries, petrous, cavernous and supra clinoid internal carotid arteries. Widely patent anterior communicating artery. Normal appearance of the anterior and middle cerebral arteries.  Posterior circulation: Normal appearance of the vertebral arteries, vertebrobasilar junction and basilar artery, as well as main branch vessels. Small bilateral posterior communicating arteries are present. Normal appearance of posterior cerebral arteries.  No large vessel occlusion, hemodynamically significant stenosis, dissection, luminal irregularity, contrast extravasation or aneurysm within the anterior nor posterior circulation.  Review of the MIP images confirms the above findings.  CTA NECK FINDINGS  Two vessel aortic arch is a normal variant. Origins of the bilateral Common carotid arteries are widely patent. Bilateral Common carotid arteries are normal course and caliber, the level the bifurcation which is unremarkable. Normal appearance of the internal carotid arteries bilaterally, no hemodynamically significant stenosis by NASCET criteria.  The origin of the left vertebral artery is somewhat obscured by streak artifact from retained contrast in the left subclavian vein. Right carotid artery appears slightly dominant. The bilateral vertebral arteries are normal in course and caliber, with homogeneous contrast opacification.  No dissection, pseudoaneurysm, suspicious luminal irregularity or contrast extravasation.  Broad reversed cervical lordosis, without fracture deformity or malalignment on the submitted soft tissue windows.  Review  of the MIP images confirms the above findings.  IMPRESSION: CT head:  No acute intracranial process.  CTA head: No acute intracranial vascular injury, no vascular etiology to explain right pain.  CTA neck: No acute vascular injury in the neck or vascular etiology to explain right neck pain.   Electronically Signed   By:  Elon Alas   On: 04/05/2014 23:43     EKG Interpretation None      MDM   Final diagnoses:  Trapezius muscle spasm   Right paraspinal neck pain for the past 5 days it is worse with movement. Equal grip strengths. No numbness or tingling. Pain radiates toward head and causing headache.  No thunderclap onset.  Suspect musculoskeletal etiology. ESR 23.  No temporal artery tenderness. Doubt meningitis, temporal arteritis, SAH.  CTA head and neck are negative for any dissection or aneurysm. Patient's pain has improved with systematic control. Stable for follow up with PCP.  Ezequiel Essex, MD 04/06/14 [phone]

## 2014-04-05 NOTE — ED Notes (Signed)
Dr. Rancour at the bedside.  

## 2014-04-05 NOTE — ED Notes (Addendum)
Called lab about sedimentation rate result. Spoke with [employer].

## 2014-04-06 LAB — SEDIMENTATION RATE: Sed Rate: 23 mm/hr — ABNORMAL HIGH (ref 0–22)

## 2014-04-06 MED ORDER — IBUPROFEN 800 MG PO TABS: 800.0000 mg | ORAL_TABLET | Freq: Three times a day (TID) | ORAL | Status: DC

## 2014-04-06 MED ORDER — CYCLOBENZAPRINE HCL 10 MG PO TABS: 10.0000 mg | ORAL_TABLET | Freq: Two times a day (BID) | ORAL | Status: DC | PRN

## 2014-04-06 MED ORDER — HYDROCODONE-ACETAMINOPHEN 5-325 MG PO TABS: 2.0000 | ORAL_TABLET | ORAL | Status: DC | PRN

## 2014-04-06 NOTE — ED Notes (Signed)
Verified with Dr. Wyvonnia Dusky that patient has missing lab, but up for discharge.  MD acknowledges, and advises to discharge patient now.

## 2014-04-06 NOTE — Discharge Instructions (Signed)
°  Muscle Cramps and Spasms °Muscle cramps and spasms occur when a muscle or muscles tighten and you have no control over this tightening (involuntary muscle contraction). They are a common problem and can develop in any muscle. The most common place is in the calf muscles of the leg. Both muscle cramps and muscle spasms are involuntary muscle contractions, but they also have differences:  °· Muscle cramps are sporadic and painful. They may last a few seconds to a quarter of an hour. Muscle cramps are often more forceful and last longer than muscle spasms. °· Muscle spasms may or may not be painful. They may also last just a few seconds or much longer. °CAUSES  °It is uncommon for cramps or spasms to be due to a serious underlying problem. In many cases, the cause of cramps or spasms is unknown. Some common causes are:  °· Overexertion.   °· Overuse from repetitive motions (doing the same thing over and over).   °· Remaining in a certain position for a long period of time.   °· Improper preparation, form, or technique while performing a sport or activity.   °· Dehydration.   °· Injury.   °· Side effects of some medicines.   °· Abnormally low levels of the salts and ions in your blood (electrolytes), especially potassium and calcium. This could happen if you are taking water pills (diuretics) or you are pregnant.   °Some underlying medical problems can make it more likely to develop cramps or spasms. These include, but are not limited to:  °· Diabetes.   °· Parkinson disease.   °· Hormone disorders, such as thyroid problems.   °· Alcohol abuse.   °· Diseases specific to muscles, joints, and bones.   °· Blood vessel disease where not enough blood is getting to the muscles.   °HOME CARE INSTRUCTIONS  °· Stay well hydrated. Drink enough water and fluids to keep your urine clear or pale yellow. °· It may be helpful to massage, stretch, and relax the affected muscle. °· For tight or tense muscles, use a warm towel, heating  pad, or hot shower water directed to the affected area. °· If you are sore or have pain after a cramp or spasm, applying ice to the affected area may relieve discomfort. °· Put ice in a plastic bag. °· Place a towel between your skin and the bag. °· Leave the ice on for 15-20 minutes, 03-04 times a day. °· Medicines used to treat a known cause of cramps or spasms may help reduce their frequency or severity. Only take over-the-counter or prescription medicines as directed by your caregiver. °SEEK MEDICAL CARE IF:  °Your cramps or spasms get more severe, more frequent, or do not improve over time.  °MAKE SURE YOU:  °· Understand these instructions. °· Will watch your condition. °· Will get help right away if you are not doing well or get worse. °Document Released: 06/01/2002 Document Revised: 04/06/2013 Document Reviewed: 11/26/2012 °[employer]® Patient Information ©2014 [employer]. ° ° °

## 2014-09-09 ENCOUNTER — Emergency Department (HOSPITAL_COMMUNITY): Payer: BC Managed Care – PPO

## 2014-09-09 ENCOUNTER — Emergency Department (HOSPITAL_COMMUNITY)
Admission: EM | Admit: 2014-09-09 | Discharge: 2014-09-10 | Disposition: A | Discharge status: Home / Self Care | Payer: BC Managed Care – PPO | Attending: Emergency Medicine | Admitting: Emergency Medicine

## 2014-09-09 ENCOUNTER — Encounter (HOSPITAL_COMMUNITY): Payer: Self-pay | Admitting: Emergency Medicine

## 2014-09-09 DIAGNOSIS — Z90710 Acquired absence of both cervix and uterus: Secondary | ICD-10-CM | POA: Insufficient documentation

## 2014-09-09 DIAGNOSIS — N949 Unspecified condition associated with female genital organs and menstrual cycle: Secondary | ICD-10-CM | POA: Insufficient documentation

## 2014-09-09 DIAGNOSIS — Z79899 Other long term (current) drug therapy: Secondary | ICD-10-CM | POA: Diagnosis not present

## 2014-09-09 DIAGNOSIS — Z87891 Personal history of nicotine dependence: Secondary | ICD-10-CM | POA: Diagnosis not present

## 2014-09-09 DIAGNOSIS — R102 Pelvic and perineal pain: Secondary | ICD-10-CM

## 2014-09-09 LAB — WET PREP, GENITAL
Clue Cells Wet Prep HPF POC: NONE SEEN
Trich, Wet Prep: NONE SEEN
Yeast Wet Prep HPF POC: NONE SEEN

## 2014-09-09 LAB — URINALYSIS, ROUTINE W REFLEX MICROSCOPIC
Bilirubin Urine: NEGATIVE
Glucose, UA: NEGATIVE mg/dL
Hgb urine dipstick: NEGATIVE
Ketones, ur: NEGATIVE mg/dL
Leukocytes, UA: NEGATIVE
Nitrite: NEGATIVE
Protein, ur: NEGATIVE mg/dL
Specific Gravity, Urine: 1.009 (ref 1.005–1.030)
Urobilinogen, UA: 0.2 mg/dL (ref 0.0–1.0)
pH: 6 (ref 5.0–8.0)

## 2014-09-09 LAB — COMPREHENSIVE METABOLIC PANEL
ALT: 13 U/L (ref 0–35)
AST: 16 U/L (ref 0–37)
Albumin: 3.8 g/dL (ref 3.5–5.2)
Alkaline Phosphatase: 81 U/L (ref 39–117)
Anion gap: 12 (ref 5–15)
BUN: 11 mg/dL (ref 6–23)
CO2: 26 mEq/L (ref 19–32)
Calcium: 9.6 mg/dL (ref 8.4–10.5)
Chloride: 104 mEq/L (ref 96–112)
Creatinine, Ser: 0.75 mg/dL (ref 0.50–1.10)
GFR calc Af Amer: >90 mL/min (ref >90)
GFR calc non Af Amer: >90 mL/min (ref >90)
Glucose, Bld: 92 mg/dL (ref 70–99)
Potassium: 3.7 mEq/L (ref 3.7–5.3)
Sodium: 142 mEq/L (ref 137–147)
Total Bilirubin: 0.3 mg/dL (ref 0.3–1.2)
Total Protein: 7.6 g/dL (ref 6.0–8.3)

## 2014-09-09 LAB — CBC WITH DIFFERENTIAL/PLATELET
Basophils Absolute: 0 10*3/uL (ref 0.0–0.1)
Basophils Relative: 0 % (ref 0–1)
Eosinophils Absolute: 0.1 10*3/uL (ref 0.0–0.7)
Eosinophils Relative: 1 % (ref 0–5)
HCT: 36.8 % (ref 36.0–46.0)
Hemoglobin: 12.6 g/dL (ref 12.0–15.0)
Lymphocytes Relative: 37 % (ref 12–46)
Lymphs Abs: 2.8 10*3/uL (ref 0.7–4.0)
MCH: 28.7 pg (ref 26.0–34.0)
MCHC: 34.2 g/dL (ref 30.0–36.0)
MCV: 83.8 fL (ref 78.0–100.0)
Monocytes Absolute: 0.6 10*3/uL (ref 0.1–1.0)
Monocytes Relative: 8 % (ref 3–12)
Neutro Abs: 4 10*3/uL (ref 1.7–7.7)
Neutrophils Relative %: 54 % (ref 43–77)
Platelets: 233 10*3/uL (ref 150–400)
RBC: 4.39 MIL/uL (ref 3.87–5.11)
RDW: 12.9 % (ref 11.5–15.5)
WBC: 7.5 10*3/uL (ref 4.0–10.5)

## 2014-09-09 LAB — LIPASE, BLOOD: Lipase: 28 U/L (ref 11–59)

## 2014-09-09 MED ORDER — ONDANSETRON HCL 4 MG/2ML IJ SOLN
4.0000 mg | Freq: Once | INTRAMUSCULAR | Status: AC
  Administered 2014-09-10: 4 mg via INTRAVENOUS
  Filled 2014-09-09: qty 2

## 2014-09-09 MED ORDER — MORPHINE SULFATE 4 MG/ML IJ SOLN
4.0000 mg | Freq: Once | INTRAMUSCULAR | Status: AC
  Administered 2014-09-09: 4 mg via INTRAVENOUS
  Filled 2014-09-09: qty 1

## 2014-09-09 MED ORDER — DIPHENHYDRAMINE HCL 50 MG/ML IJ SOLN
25.0000 mg | Freq: Once | INTRAMUSCULAR | Status: AC
  Administered 2014-09-09: 25 mg via INTRAVENOUS
  Filled 2014-09-09: qty 1

## 2014-09-09 MED ORDER — IOHEXOL 300 MG/ML  SOLN
100.0000 mL | Freq: Once | INTRAMUSCULAR | Status: AC | PRN
  Administered 2014-09-09: 100 mL via INTRAVENOUS

## 2014-09-09 MED ORDER — GI COCKTAIL ~~LOC~~
30.0000 mL | Freq: Once | ORAL | Status: AC
  Administered 2014-09-09: 30 mL via ORAL
  Filled 2014-09-09: qty 30

## 2014-09-09 MED ORDER — SODIUM CHLORIDE 0.9 % IV BOLUS (SEPSIS)
1000.0000 mL | Freq: Once | INTRAVENOUS | Status: AC
  Administered 2014-09-09: 1000 mL via INTRAVENOUS

## 2014-09-09 MED ORDER — ONDANSETRON HCL 4 MG/2ML IJ SOLN
4.0000 mg | Freq: Once | INTRAMUSCULAR | Status: AC
  Administered 2014-09-09: 4 mg via INTRAVENOUS
  Filled 2014-09-09: qty 2

## 2014-09-09 NOTE — ED Notes (Signed)
Per pt, states increased pelvic pain and pressure since this am-no dysuria

## 2014-09-09 NOTE — ED Notes (Addendum)
Pt and family upset over delay in care. Charge nurse aware.

## 2014-09-09 NOTE — ED Notes (Signed)
Patient ambulated to restroom with assistance. States pain is worse when walking.

## 2014-09-09 NOTE — ED Notes (Signed)
Pelvic Cart order has been acknowledged, currently waiting for equipment to come available.

## 2014-09-09 NOTE — ED Notes (Signed)
Patient with sudden onset abd tightness and discomfort. Patient states this is new. PA notified. Orders received.

## 2014-09-10 LAB — GC/CHLAMYDIA PROBE AMP
CT Probe RNA: NEGATIVE
GC Probe RNA: NEGATIVE

## 2014-09-10 MED ORDER — ONDANSETRON HCL 4 MG PO TABS: 4.0000 mg | ORAL_TABLET | Freq: Four times a day (QID) | ORAL | Status: DC

## 2014-09-10 MED ORDER — HYDROCODONE-ACETAMINOPHEN 5-325 MG PO TABS: 1.0000 | ORAL_TABLET | Freq: Four times a day (QID) | ORAL | Status: DC | PRN

## 2014-09-10 NOTE — Discharge Instructions (Signed)

## 2014-09-10 NOTE — ED Provider Notes (Signed)
CSN: [id number]     Arrival date & time 09/09/14  1804 History   First MD Initiated Contact with Patient 09/09/14 2005     Chief Complaint  Patient presents with  . Pelvic Pain     (Consider location/radiation/quality/duration/timing/severity/associated sxs/prior Treatment) HPI Comments: Patient with chief complaint of constant, sudden onset, pelvic pain. She states pain started this morning. She describes it mostly as a pressure. She denies any fevers, chills, nausea, vomiting, diarrhea, constipation, dysuria, hematuria, vaginal discharge, or vaginal bleeding. She reports having history of total hysterectomy. She states that there are no aggravating or alleviating factors. She has not tried taking anything to alleviate her symptoms.  The history is provided by the patient. No language interpreter was used.    History reviewed. No pertinent past medical history. Past Surgical History  Procedure Laterality Date  . Shoulder surgery    . Abdominal hysterectomy    . Ectopic pregnancy surgery    . Cholecystectomy    . Hernia repair     No family history on file. History  Substance Use Topics  . Smoking status: Former Research scientist (life sciences)  . Smokeless tobacco: Not on file  . Alcohol Use: No   OB History   Grav Para Term Preterm Abortions TAB SAB Ect Mult Living                 Review of Systems  All other systems reviewed and are negative.     Allergies  Desvenlafaxine and Iohexol  Home Medications   Prior to Admission medications   Medication Sig Start Date End Date Taking? Authorizing Provider  cholecalciferol (VITAMIN D) 1000 UNITS tablet Take 1,000 Units by mouth daily.   Yes Historical Provider, MD  Linoleic Acid Conjugated 1000 MG CAPS Take 3,000 mg by mouth daily.   Yes Historical Provider, MD  vitamin B-12 (CYANOCOBALAMIN) 1000 MCG tablet Take 1,000 mcg by mouth daily.   Yes Historical Provider, MD  vitamin C (ASCORBIC ACID) 500 MG tablet Take 500 mg by mouth daily.   Yes  Historical Provider, MD  HYDROcodone-acetaminophen (NORCO/VICODIN) 5-325 MG per tablet Take 1-2 tablets by mouth every 6 (six) hours as needed for moderate pain or severe pain. 09/10/14   Montine Circle, PA-C  ondansetron (ZOFRAN) 4 MG tablet Take 1 tablet (4 mg total) by mouth every 6 (six) hours. 09/10/14   Montine Circle, PA-C   BP 115/59  Pulse 56  Temp(Src) 97.7 F (36.5 C) (Oral)  Resp 16  SpO2 98% Physical Exam  Nursing note and vitals reviewed. Constitutional: She is oriented to person, place, and time. She appears well-developed and well-nourished.  HENT:  Head: Normocephalic and atraumatic.  Eyes: Conjunctivae and EOM are normal. Pupils are equal, round, and reactive to light.  Neck: Normal range of motion. Neck supple.  Cardiovascular: Normal rate and regular rhythm.  Exam reveals no gallop and no friction rub.   No murmur heard. Pulmonary/Chest: Effort normal and breath sounds normal. No respiratory distress. She has no wheezes. She has no rales. She exhibits no tenderness.  Abdominal: Soft. She exhibits no distension and no mass. There is no tenderness. There is no rebound and no guarding.  No focal abdominal tenderness, no RLQ tenderness or pain at McBurney's point, no RUQ tenderness or Murphy's sign, no left-sided abdominal tenderness, no fluid wave, or signs of peritonitis   Genitourinary:  Pelvic exam chaperoned by female ER tech, no right or left adnexal tenderness, no uterine tenderness, no vaginal discharge, no bleeding, no  CMT or friability, no foreign body, no injury to the external genitalia, no other significant findings   Musculoskeletal: Normal range of motion. She exhibits no edema and no tenderness.  Neurological: She is alert and oriented to person, place, and time.  Skin: Skin is warm and dry.  Psychiatric: She has a normal mood and affect. Her behavior is normal. Judgment and thought content normal.    ED Course  Procedures (including critical care  time) Labs Review Labs Reviewed  WET PREP, GENITAL - Abnormal; Notable for the following:    WBC, Wet Prep HPF POC RARE (*)    All other components within normal limits  GC/CHLAMYDIA PROBE AMP  URINALYSIS, ROUTINE W REFLEX MICROSCOPIC  CBC WITH DIFFERENTIAL  COMPREHENSIVE METABOLIC PANEL  LIPASE, BLOOD    Imaging Review Ct Abdomen Pelvis W Contrast  09/10/2014   CLINICAL DATA:  Lower pelvic pain for 1 day.  EXAM: CT ABDOMEN AND PELVIS WITH CONTRAST  TECHNIQUE: Multidetector CT imaging of the abdomen and pelvis was performed using the standard protocol following bolus administration of intravenous contrast.  CONTRAST:  137mL OMNIPAQUE IOHEXOL 300 MG/ML  SOLN  COMPARISON:  CT of the abdomen and pelvis from 07/23/2009  FINDINGS: The visualized lung bases are clear.  The liver and spleen are unremarkable in appearance. The patient is status cholecystectomy, with clips noted along the gallbladder fossa. The pancreas and adrenal glands are unremarkable.  Nonobstructing stones are noted at the lower pole of the left kidney, measuring up to 5 mm in size. The kidneys are otherwise unremarkable in appearance. There is no evidence of hydronephrosis. No obstructing ureteral stones are seen. No perinephric stranding is appreciated.  No free fluid is identified. The small bowel is unremarkable in appearance. The stomach is within normal limits. No acute vascular abnormalities are seen. Minimal calcification is noted along the distal abdominal aorta.  The appendix is normal in caliber and contains trace air, without evidence for appendicitis. The colon is unremarkable in appearance.  A tiny umbilical hernia is seen, containing only fat, with mild associated soft tissue inflammation.  The bladder is mildly distended and grossly unremarkable. The somewhat thickened appearance of the pelvic floor appears stable from 2010 and likely reflects the patient's baseline. The patient is status post hysterectomy. No suspicious  adnexal masses are seen. No inguinal lymphadenopathy is seen.  No acute osseous abnormalities are identified.  IMPRESSION: 1. No acute abnormality seen within the abdomen or pelvis. 2. Nonobstructing left renal stones noted. 3. Tiny umbilical hernia, containing only fat, with mild associated soft tissue inflammation.   Electronically Signed   By: Garald Balding [name]   On: 09/10/2014 00:09     EKG Interpretation None      MDM   Final diagnoses:  Pelvic pain in female   Patient with pelvic pain. Pelvic exam is unremarkable. Patient has a history of total abdominal hysterectomy. In the surgical history, we'll check CT abdomen pelvis to rule out any other abnormality.  CT is negative for acute process. Will treat the patient's pain, and recommend OB/GYN followup.  Patient discussed with Dr. Stevie Kern, who agrees with the plan.   Montine Circle, PA-C 09/10/14 [phone]

## 2014-09-10 NOTE — ED Notes (Signed)
PA at bedside.

## 2014-09-22 NOTE — ED Provider Notes (Signed)
Medical screening examination/treatment/procedure(s) were performed by non-physician practitioner and as supervising physician I was immediately available for consultation/collaboration.   EKG Interpretation None       [name] Relic, MD 09/22/14 1311

## 2014-10-19 ENCOUNTER — Telehealth: Payer: Self-pay | Admitting: Gynecology

## 2014-10-19 NOTE — Telephone Encounter (Signed)
On Call Note 12 AM :  Taking shower and felt something bulging from the vagina.  No pain, difficulty with voiding or with BM's.  No fever, chills, N/V, UTI symptoms. Status post hysterectomy in the past.  No vaginal bleeding or identifiable precipitating events.  Recommend OV in AM, ER evaluation tonight if other symptoms develop.

## 2014-10-22 ENCOUNTER — Ambulatory Visit (INDEPENDENT_AMBULATORY_CARE_PROVIDER_SITE_OTHER): Payer: BC Managed Care – PPO | Admitting: Obstetrics and Gynecology

## 2014-10-22 ENCOUNTER — Encounter: Payer: Self-pay | Admitting: Obstetrics and Gynecology

## 2014-10-22 VITALS — BP 136/74 | HR 60 | Resp 20 | Ht 65.5 in | Wt 188.6 lb

## 2014-10-22 DIAGNOSIS — N816 Rectocele: Secondary | ICD-10-CM

## 2014-10-22 DIAGNOSIS — IMO0002 Reserved for concepts with insufficient information to code with codable children: Secondary | ICD-10-CM

## 2014-10-22 DIAGNOSIS — N993 Prolapse of vaginal vault after hysterectomy: Secondary | ICD-10-CM

## 2014-10-22 DIAGNOSIS — R300 Dysuria: Secondary | ICD-10-CM

## 2014-10-22 DIAGNOSIS — N8110 Cystocele, unspecified: Secondary | ICD-10-CM

## 2014-10-22 LAB — POCT URINALYSIS DIPSTICK
Bilirubin, UA: NEGATIVE
Blood, UA: NEGATIVE
Glucose, UA: NEGATIVE
Ketones, UA: NEGATIVE
Leukocytes, UA: NEGATIVE
Nitrite, UA: NEGATIVE
Protein, UA: NEGATIVE
Urobilinogen, UA: NEGATIVE
pH, UA: 7

## 2014-10-22 NOTE — Progress Notes (Signed)
GYNECOLOGY VISIT  PCP:    Referring provider:   HPI: 48 y.o.   Married  Caucasian  female   [phone] with No LMP recorded. Patient has had a hysterectomy.  And removal of bilateral ovaries. here for Vulvar Mass. Feels like she is sitting on a tampon.  Standing makes it progressive. No drainage.  No fevers.    Having cramps like menses.  Also having back pain.  Voiding more often.  Some stinging with urination but not like a bladder infection.  Can leak urine if not getting to bathroom quickly.  No real leak with laugh or cough.    Moved back to [place] one year ago.  Having a lot of anxiety.  Had anxiety after hysterectomy also.  Seeing PCP on Nov. 9 about this.   Went to the ER on 09/09/14 for pelvic pain.  Wet pre and GC/CT negative.    CT scan done CLINICAL DATA: Lower pelvic pain for 1 day.  EXAM:  CT ABDOMEN AND PELVIS WITH CONTRAST  TECHNIQUE:  Multidetector CT imaging of the abdomen and pelvis was performed  using the standard protocol following bolus administration of  intravenous contrast.  CONTRAST: 1102mL OMNIPAQUE IOHEXOL 300 MG/ML SOLN  COMPARISON: CT of the abdomen and pelvis from 07/23/2009  FINDINGS:  The visualized lung bases are clear.  The liver and spleen are unremarkable in appearance. The patient is  status cholecystectomy, with clips noted along the gallbladder  fossa. The pancreas and adrenal glands are unremarkable.  Nonobstructing stones are noted at the lower pole of the left  kidney, measuring up to 5 mm in size. The kidneys are otherwise  unremarkable in appearance. There is no evidence of hydronephrosis.  No obstructing ureteral stones are seen. No perinephric stranding is  appreciated.  No free fluid is identified. The small bowel is unremarkable in  appearance. The stomach is within normal limits. No acute vascular  abnormalities are seen. Minimal calcification is noted along the  distal abdominal aorta.  The appendix is normal in  caliber and contains trace air, without  evidence for appendicitis. The colon is unremarkable in appearance.  A tiny umbilical hernia is seen, containing only fat, with mild  associated soft tissue inflammation.  The bladder is mildly distended and grossly unremarkable. The  somewhat thickened appearance of the pelvic floor appears stable  from 2010 and likely reflects the patient's baseline. The patient is  status post hysterectomy. No suspicious adnexal masses are seen. No  inguinal lymphadenopathy is seen.  No acute osseous abnormalities are identified.  IMPRESSION:  1. No acute abnormality seen within the abdomen or pelvis.  2. Nonobstructing left renal stones noted.  3. Tiny umbilical hernia, containing only fat, with mild associated  soft tissue inflammation.  Electronically Signed  By: Garald Balding [name]  On: 09/10/2014 00:09  Working out is important to patient.   Urine dip - clear. Ph 7.  GYNECOLOGIC HISTORY: No LMP recorded. Patient has had a hysterectomy. Sexually active:  yes Partner preference: female Contraception: hysterectomy   Menopausal hormone therapy: none DES exposure: no   Blood transfusions: no   Sexually transmitted diseases: no   GYN procedures and prior surgeries:  Hysterectomy, ectopic surgery Last mammogram: 09/2013  Normal per pt             Last pap and high risk HPV testing: 06/2013 Normal per pt     History of abnormal pap smear:  yes   OB History   Grav Para  Term Preterm Abortions TAB SAB Ect Mult Living   3 2 2  1   1  2        LIFESTYLE: Exercise: yes, weight training, zumba             Tobacco: no Alcohol: no Drug use: no  Patient Active Problem List   Diagnosis Date Noted  . DYSTHYMIC DISORDER 05/25/2009  . ACNE VULGARIS, FACIAL 05/25/2009  . DRUG WITHDRAWAL 05/03/2009  . SEBACEOUS CYST, INFECTED 04/26/2009  . ABDOMINAL PAIN, LEFT LOWER QUADRANT 03/30/2009  . HEMATURIA UNSPECIFIED 03/21/2009  . FLANK PAIN 03/21/2009  .  DERMATITIS, ATOPIC 12/29/2008  . NECK SPRAIN AND STRAIN 11/02/2008  . RESTLESS LEG SYNDROME 09/06/2008  . TRANSIENT DISORDER INITIATING/MAINTAINING SLEEP 07/23/2008  . COLONIC POLYPS, HYPERPLASTIC 05/15/2008  . HEMORRHOIDS, INTERNAL 05/15/2008  . UMBILICAL HERNIA 02/58/5277  . WEIGHT GAIN 03/22/2008  . MIGRAINE HEADACHE 02/26/2008  . SYMPTOMATIC MENOPAUSAL/FEMALE CLIMACTERIC STATES 02/26/2008  . NEPHROLITHIASIS, HX OF 09/29/2007    Past Medical History  Diagnosis Date  . Anxiety   . Abnormal uterine bleeding 2008    reason for hysterectomy  . Abnormal Pap smear of cervix 1991 - 1995    Past Surgical History  Procedure Laterality Date  . Shoulder surgery    . Abdominal hysterectomy  08/2007    complete  . Ectopic pregnancy surgery    . Cholecystectomy    . Hernia repair    . Cervix lesion destruction  1991- 1995    Current Outpatient Prescriptions  Medication Sig Dispense Refill  . cholecalciferol (VITAMIN D) 1000 UNITS tablet Take 1,000 Units by mouth daily.      . Linoleic Acid Conjugated 1000 MG CAPS Take 3,000 mg by mouth daily.      Marland Kitchen UNABLE TO FIND 5 - TTBP with vitamin B6 Supplement  100 mg a day      . vitamin B-12 (CYANOCOBALAMIN) 1000 MCG tablet Take 1,000 mcg by mouth daily.      . vitamin C (ASCORBIC ACID) 500 MG tablet Take 500 mg by mouth daily.       No current facility-administered medications for this visit.     ALLERGIES: Desvenlafaxine; Iodides; and Iohexol  Family History  Problem Relation Age of Onset  . Hypertension Mother   . Diabetes Father   . Cancer Paternal Uncle     Pancreas  . Heart attack Maternal Grandmother     History   Social History  . Marital Status: Married    Spouse Name: N/A    Number of Children: N/A  . Years of Education: N/A   Occupational History  . Not on file.   Social History Main Topics  . Smoking status: Former Research scientist (life sciences)  . Smokeless tobacco: Never Used  . Alcohol Use: No  . Drug Use: No  . Sexual  Activity: Yes    Partners: Male    Birth Control/ Protection: Surgical     Comment: Hyst   Other Topics Concern  . Not on file   Social History Narrative  . No narrative on file    ROS:  Pertinent items are noted in HPI.  PHYSICAL EXAMINATION:    BP 136/74  Pulse 60  Resp 20  Ht 5' 5.5" (1.664 m)  Wt 188 lb 9.6 oz (85.548 kg)  BMI 30.90 kg/m2   Wt Readings from Last 3 Encounters:  10/22/14 188 lb 9.6 oz (85.548 kg)  05/25/09 222 lb (100.699 kg)  05/03/09 226 lb (102.513 kg)  Ht Readings from Last 3 Encounters:  10/22/14 5' 5.5" (1.664 m)  05/25/09 5\' 7"  (1.702 m)  05/03/09 5\' 7"  (1.702 m)    General appearance: alert, cooperative and appears stated age Head: Normocephalic, without obvious abnormality, atraumatic Lungs: clear to auscultation bilaterally Heart: regular rate and rhythm Abdomen: pfannenstiel, soft, non-tender; no masses,  no organomegaly Back - +/- Right CVA tenderness.   Pelvic: External genitalia:  no lesions              Urethra:  normal appearing urethra with no masses, tenderness or lesions              Bartholins and Skenes: normal                 Vagina: normal appearing vagina with normal color and discharge, no lesions, third degree cystocele, first degree vault prolapse, first degree rectocele.Marland Kitchen  3 - 4 mm stitch granuloma under mucosa at cuff?  Tender to palpation.               Cervix:  absetn                 Bimanual Exam:  Uterus:   absent                                      Adnexa:  no masses                                      Rectovaginal: Confirms                                      Anus:  normal sphincter tone, no lesions  ASSESSMENT  Pelvic organ prolapse following hysterectomy.   PLAN  Discussed prolapse, etiologies and treatment options.  Discussed observation, pessary and surgery.  Would need urodynamics if has surgery. ACOG materials to patient.  Return for annual exam in January 2016, sooner if decides on a  pessary.  45 minutes face to face time of which over 50% was spent in counseling.   An After Visit Summary was printed and given to the patient.

## 2014-10-25 ENCOUNTER — Encounter: Payer: Self-pay | Admitting: Obstetrics and Gynecology

## 2014-10-25 ENCOUNTER — Telehealth: Payer: Self-pay | Admitting: *Deleted

## 2014-10-25 DIAGNOSIS — IMO0002 Reserved for concepts with insufficient information to code with codable children: Secondary | ICD-10-CM

## 2014-10-25 NOTE — Telephone Encounter (Signed)
See My Chart message from patient. Requesting to proceed with surgery and requesting to move up AEX. Advised previously scheduled NGYN/AEX for 11-10-14 was canceled when we moved her up to 10-22-14 due to her request for problem visit. Advised limited availability for surgery before end of year. 12-21-14 is available and is likely only day with enough time unless there is a cancellation. Needs AEX prior to urodynamics. Work in [employer] appointment scheduled for 11-01-14 at 1130. Urodynamics scheduled for 11-10-14. After that, will be able to determine plan for surgery. Discussed financial implications. Has just started deductible year over on 10-24-14, advised OOP cost due 2 weeks prior to surgery. Will begin precert process. Patient has new policy today, will fax new card to Sabrina's attention in am. How should this case be precerted? Since having AEX on 11-01-14, will she need repeat U/A prior to urodynamics on 11-10-14?

## 2014-10-25 NOTE — Telephone Encounter (Signed)
See phone note.  Routing to provider for final review. Patient agreeable to disposition. Will close encounter

## 2014-10-25 NOTE — Telephone Encounter (Signed)
I am anticipating an anterior and posterior colporrhaphy with possible sacrospinous fixation, TVT Exact Midurethral sling and cystoscopy.   Thanks.

## 2014-10-27 NOTE — Telephone Encounter (Signed)
Patient insurance information has not been received.

## 2014-10-28 NOTE — Telephone Encounter (Signed)
Call to patient. Discussed that AEX scheduled for Monday will need to be rescheduled due to surgery conflict.  Discussed that with limitations in surgery schedule, not realistic that we will be able to get all of her testing completed and surgery scheduled before end of year. Patient states this is probably better for her financially anyway. Has not yet sent new insurance info to our office. Thinks deductible starts over again 12-24-14. Advised that once she provides insurance info, Gabriel Cirri can check benefits and provide info. Will schedule AEX which needs to be done before proceeding. Scheduled for 12-20-14. When patient ready, will provide insurance information and make determination on scheduling of urodynamic testing and plan for surgery. Plan for sometime in January or February. Patient asking if there are any restrictions on physical activity or exercise until she can have surgery done?

## 2014-10-28 NOTE — Telephone Encounter (Signed)
There are no physical restrictions but the more heavy lifting and straining the patient does, the more the prolapse may progress.  Sexual activity will not worsen the prolapse or cause harm.

## 2014-10-29 NOTE — Telephone Encounter (Signed)
Patient notified of Dr Elza Rafter response. Encounter closed.

## 2014-11-01 ENCOUNTER — Ambulatory Visit (INDEPENDENT_AMBULATORY_CARE_PROVIDER_SITE_OTHER): Payer: BC Managed Care – PPO | Admitting: Internal Medicine

## 2014-11-01 ENCOUNTER — Encounter: Payer: Self-pay | Admitting: Internal Medicine

## 2014-11-01 ENCOUNTER — Ambulatory Visit: Payer: BC Managed Care – PPO | Admitting: Obstetrics and Gynecology

## 2014-11-01 VITALS — BP 110/72 | HR 79 | Temp 98.3°F | Ht 65.5 in | Wt 187.0 lb

## 2014-11-01 DIAGNOSIS — F32A Depression, unspecified: Secondary | ICD-10-CM | POA: Insufficient documentation

## 2014-11-01 DIAGNOSIS — F329 Major depressive disorder, single episode, unspecified: Secondary | ICD-10-CM | POA: Insufficient documentation

## 2014-11-01 DIAGNOSIS — F4322 Adjustment disorder with anxiety: Secondary | ICD-10-CM

## 2014-11-01 DIAGNOSIS — F419 Anxiety disorder, unspecified: Secondary | ICD-10-CM

## 2014-11-01 MED ORDER — ALPRAZOLAM 0.25 MG PO TABS: 0.2500 mg | ORAL_TABLET | Freq: Two times a day (BID) | ORAL | Status: DC | PRN

## 2014-11-01 MED ORDER — BUPROPION HCL ER (XL) 150 MG PO TB24: 150.0000 mg | ORAL_TABLET | Freq: Every day | ORAL | Status: DC

## 2014-11-01 NOTE — Progress Notes (Signed)
Pre visit review using our clinic review tool, if applicable. No additional management support is needed unless otherwise documented below in the visit note. 

## 2014-11-01 NOTE — Assessment & Plan Note (Signed)
Discussed couples therapy- she declines this at this time, reports her husband would be unwilling Will start Wellbutrin XL and low dose short term xanax

## 2014-11-01 NOTE — Progress Notes (Signed)
HPI  [name] is a 48 yr old female being seen today to establish care. She was a patient of Dr. Arnoldo Morale and moved away. She is re-establishing care at this time. She does have concern with anxiety. She states she has periods of anxiety attacks where she feels short of breath and hopeless. She is able to work through these. She has previously been on SSRIs for depression, however she gained over 30 lbs with these and weaned off of them. She does have the option with her new employment to see a counselor and has started this option.  She denies any other concerns. No chest pain, shortness of breath, or pain.   Health Maintenance: Flu shot: never; will receive later, running fever  Tetanus:2009 Pap smear: 2013 LMP: hysterectomy Mammogram: 2014; will schedule for this year Eye exam: 2014    Past Medical History  Diagnosis Date  . Anxiety   . Abnormal uterine bleeding 2008    reason for hysterectomy  . Abnormal Pap smear of cervix 1991 - 1995    Current Outpatient Prescriptions  Medication Sig Dispense Refill  . cholecalciferol (VITAMIN D) 1000 UNITS tablet Take 1,000 Units by mouth daily.    . Linoleic Acid Conjugated 1000 MG CAPS Take 3,000 mg by mouth daily.    Marland Kitchen UNABLE TO FIND 5 - TTBP with vitamin B6 Supplement  100 mg a day    . vitamin B-12 (CYANOCOBALAMIN) 1000 MCG tablet Take 1,000 mcg by mouth daily.    . vitamin C (ASCORBIC ACID) 500 MG tablet Take 500 mg by mouth daily.     No current facility-administered medications for this visit.    Allergies  Allergen Reactions  . Desvenlafaxine     REACTION: withdrawl  . Iodides Itching  . Iohexol      Code: RASH, Desc: PT STATES BACK IN THE 70S SHE HAD IV DYE REACTION.04/30/07/RM---pt given omnipaque 300 w/out pre meds; no complications 1/75/10 slg, Onset Date: [id number]     Family History  Problem Relation Age of Onset  . Hypertension Mother   . Diabetes Father   . Cancer Paternal Uncle     Pancreas  . Heart attack  Maternal Grandmother     History   Social History  . Marital Status: Married    Spouse Name: N/A    Number of Children: N/A  . Years of Education: N/A   Occupational History  . Not on file.   Social History Main Topics  . Smoking status: Former Research scientist (life sciences)  . Smokeless tobacco: Never Used  . Alcohol Use: No  . Drug Use: No  . Sexual Activity:    Partners: Male    Birth Control/ Protection: Surgical     Comment: Hyst   Other Topics Concern  . Not on file   Social History Narrative    ROS:  Constitutional: Denies fever, malaise, fatigue, headache or abrupt weight changes.  HEENT: Denies eye pain, eye redness, ear pain, ringing in the ears, wax buildup, runny nose, nasal congestion, bloody nose, or sore throat. Respiratory: Denies difficulty breathing, shortness of breath, cough or sputum production.   Cardiovascular: Denies chest pain, chest tightness, palpitations or swelling in the hands or feet.  Gastrointestinal: Denies abdominal pain, bloating, constipation, diarrhea or blood in the stool.  GU: Denies frequency, urgency, pain with urination, blood in urine, odor or discharge. Musculoskeletal: Denies decrease in range of motion, difficulty with gait, muscle pain or joint pain and swelling.  Skin: Denies redness, rashes, lesions  or ulcercations.  Neurological: Endorses anxiety. Denies dizziness, difficulty with memory, difficulty with speech or problems with balance and coordination.   No other specific complaints in a complete review of systems (except as listed in HPI above).  PE:  There were no vitals taken for this visit. Wt Readings from Last 3 Encounters:  10/22/14 188 lb 9.6 oz (85.548 kg)  05/25/09 222 lb (100.699 kg)  05/03/09 226 lb (102.513 kg)    General: Appears their stated age, well developed, well nourished in NAD. HEENT: Head: normal shape and size; Eyes: sclera white, no icterus, conjunctiva pink, PERRLA and EOMs intact; Ears: Tm's gray and intact,  normal light reflex; Nose: mucosa pink and moist, septum midline; Throat/Mouth: Teeth present, mucosa pink and moist, no lesions or ulcerations noted.  Neck: Normal range of motion. Neck supple, trachea midline. No massses, lumps or thyromegaly present.  Cardiovascular: Normal rate and rhythm. S1,S2 noted.  No murmur, rubs or gallops noted. No JVD or BLE edema. No carotid bruits noted. Pulmonary/Chest: Normal effort and positive vesicular breath sounds. No respiratory distress. No wheezes, rales or ronchi noted.  Abdomen: Soft and nontender. Normal bowel sounds, no bruits noted. No distention or masses noted. Liver, spleen and kidneys non palpable. Musculoskeletal: Normal range of motion. No signs of joint swelling. No difficulty with gait.  Neurological: Alert and oriented. Cranial nerves II-XII intact. Coordination normal. +DTRs bilaterally. Psychiatric: Mood and affect normal. Behavior is normal. Judgment and thought content normal.   EKG:  BMET    Component Value Date/Time   NA 142 09/09/2014 2223   K 3.7 09/09/2014 2223   CL 104 09/09/2014 2223   CO2 26 09/09/2014 2223   GLUCOSE 92 09/09/2014 2223   BUN 11 09/09/2014 2223   CREATININE 0.75 09/09/2014 2223   CALCIUM 9.6 09/09/2014 2223   GFRNONAA >90 09/09/2014 2223   GFRAA >90 09/09/2014 2223    Lipid Panel     Component Value Date/Time   CHOL 189 02/17/2008 1053   TRIG 145 02/17/2008 1053   HDL 23.2* 02/17/2008 1053   CHOLHDL 8.1 CALC 02/17/2008 1053   VLDL 29 02/17/2008 1053   LDLCALC 137* 02/17/2008 1053    CBC    Component Value Date/Time   WBC 7.5 09/09/2014 2223   RBC 4.39 09/09/2014 2223   HGB 12.6 09/09/2014 2223   HCT 36.8 09/09/2014 2223   PLT 233 09/09/2014 2223   MCV 83.8 09/09/2014 2223   MCH 28.7 09/09/2014 2223   MCHC 34.2 09/09/2014 2223   RDW 12.9 09/09/2014 2223   LYMPHSABS 2.8 09/09/2014 2223   MONOABS 0.6 09/09/2014 2223   EOSABS 0.1 09/09/2014 2223   BASOSABS 0.0 09/09/2014 2223    Hgb  A1C No results found for: HGBA1C     Assessment and Plan:  Health Maintenance: She will return for labs and physical  Anxiety She wishes to start Wellbutrin, her mother takes this Rx Wellbutrin 150mg  PO daily Rx Xanax 0.5mg  PO BID prn anxiety Must follow up in 4 weeks, can discuss this at physical  Westley Foots, Student-NP

## 2014-11-01 NOTE — Patient Instructions (Signed)
Generalized Anxiety Disorder Generalized anxiety disorder (GAD) is a mental disorder. It interferes with life functions, including relationships, work, and school. GAD is different from normal anxiety, which everyone experiences at some point in their lives in response to specific life events and activities. Normal anxiety actually helps us prepare for and get through these life events and activities. Normal anxiety goes away after the event or activity is over.  GAD causes anxiety that is not necessarily related to specific events or activities. It also causes excess anxiety in proportion to specific events or activities. The anxiety associated with GAD is also difficult to control. GAD can vary from mild to severe. People with severe GAD can have intense waves of anxiety with physical symptoms (panic attacks).  SYMPTOMS The anxiety and worry associated with GAD are difficult to control. This anxiety and worry are related to many life events and activities and also occur more days than not for 6 months or longer. People with GAD also have three or more of the following symptoms (one or more in children):  Restlessness.   Fatigue.  Difficulty concentrating.   Irritability.  Muscle tension.  Difficulty sleeping or unsatisfying sleep. DIAGNOSIS GAD is diagnosed through an assessment by your health care provider. Your health care provider will ask you questions aboutyour mood,physical symptoms, and events in your life. Your health care provider may ask you about your medical history and use of alcohol or drugs, including prescription medicines. Your health care provider may also do a physical exam and blood tests. Certain medical conditions and the use of certain substances can cause symptoms similar to those associated with GAD. Your health care provider may refer you to a mental health specialist for further evaluation. TREATMENT The following therapies are usually used to treat GAD:    Medication. Antidepressant medication usually is prescribed for long-term daily control. Antianxiety medicines may be added in severe cases, especially when panic attacks occur.   Talk therapy (psychotherapy). Certain types of talk therapy can be helpful in treating GAD by providing support, education, and guidance. A form of talk therapy called cognitive behavioral therapy can teach you healthy ways to think about and react to daily life events and activities.  Stress managementtechniques. These include yoga, meditation, and exercise and can be very helpful when they are practiced regularly. A mental health specialist can help determine which treatment is best for you. Some people see improvement with one therapy. However, other people require a combination of therapies. Document Released: 04/06/2013 Document Revised: 04/26/2014 Document Reviewed: 04/06/2013 Exit[employer] Patient Information 2015 [employer]. This information is not intended to replace advice given to you by your health care provider. Make sure you discuss any questions you have with your health care provider.  

## 2014-11-01 NOTE — Progress Notes (Signed)
HPI  Pt presents to the clinic today to establish care. She recently moved back to the area. She had seen Dr. Arnoldo Morale many years ago.  Flu: never Tetanus: 2009 LMP: Hysterectomy Pap Smear: 2013 Mammogram: 09/2013 Colon Screening: 2005 Vision Screening: Yearly Dentist: Biannually  She does have some concerns today about anxiety. This has been a chronic issue for her but seems to be getting worse here lately. She reports that her trigger for her anxiety is not justified. She feels like her husband may be cheating on her but she has no proof. She has confronted him about this but he denies. She reports that she has been on SSRI's in the past for the same and have had bad side effects and weight gain. She is interested in Wellbutrin, her mother is on this and it seems to work well for her.  Past Medical History  Diagnosis Date  . Anxiety   . Abnormal uterine bleeding 2008    reason for hysterectomy  . Abnormal Pap smear of cervix 1991 - 1995  . Chicken pox   . Depression   . Hx of colonic polyps   . Bladder prolapse, female, acquired     Current Outpatient Prescriptions  Medication Sig Dispense Refill  . cholecalciferol (VITAMIN D) 1000 UNITS tablet Take 1,000 Units by mouth daily.    . Linoleic Acid Conjugated 1000 MG CAPS Take 3,000 mg by mouth daily.    Marland Kitchen UNABLE TO FIND 5 - HTTP with vitamin B6 Supplement  100 mg a day    . vitamin B-12 (CYANOCOBALAMIN) 1000 MCG tablet Take 1,000 mcg by mouth daily.    . vitamin C (ASCORBIC ACID) 500 MG tablet Take 500 mg by mouth daily.     No current facility-administered medications for this visit.    Allergies  Allergen Reactions  . Desvenlafaxine     REACTION: withdrawl  . Iodides Itching  . Iohexol      Code: RASH, Desc: PT STATES BACK IN THE 70S SHE HAD IV DYE REACTION.04/30/07/RM---pt given omnipaque 300 w/out pre meds; no complications 03/18/70 slg, Onset Date: [id number]     Family History  Problem Relation Age of Onset  .  Hypertension Mother   . Diabetes Father   . Cancer Paternal Uncle     Pancreas  . Heart attack Maternal Grandmother   . Drug abuse Sister     History   Social History  . Marital Status: Married    Spouse Name: N/A    Number of Children: N/A  . Years of Education: N/A   Occupational History  . Not on file.   Social History Main Topics  . Smoking status: Former Research scientist (life sciences)  . Smokeless tobacco: Never Used  . Alcohol Use: No  . Drug Use: No  . Sexual Activity:    Partners: Male    Birth Control/ Protection: Surgical     Comment: Hyst   Other Topics Concern  . Not on file   Social History Narrative    ROS:  Constitutional: Denies fever, malaise, fatigue, headache or abrupt weight changes.  Respiratory: Denies difficulty breathing, shortness of breath, cough or sputum production.   Cardiovascular: Denies chest pain, chest tightness, palpitations or swelling in the hands or feet.  Psych: Pt reports anxiety and depression. Denies SI/HI.   No other specific complaints in a complete review of systems (except as listed in HPI above).  PE:  BP 110/72 mmHg  Pulse 79  Temp(Src) 98.3 F (36.8 C) (Oral)  Ht 5' 5.5" (1.664 m)  Wt 187 lb (84.823 kg)  BMI 30.63 kg/m2  SpO2 99% Wt Readings from Last 3 Encounters:  11/01/14 187 lb (84.823 kg)  10/22/14 188 lb 9.6 oz (85.548 kg)  05/25/09 222 lb (100.699 kg)    General: Appears her stated age, obese but well developed, well nourished in NAD. Cardiovascular: Normal rate and rhythm. S1,S2 noted.  No murmur, rubs or gallops noted.  Pulmonary/Chest: Normal effort and positive vesicular breath sounds. No respiratory distress. No wheezes, rales or ronchi noted.  Psychiatric: Mood anxious but affect normal. Behavior is normal. Judgment and thought content normal.   EKG:  BMET    Component Value Date/Time   NA 142 09/09/2014 2223   K 3.7 09/09/2014 2223   CL 104 09/09/2014 2223   CO2 26 09/09/2014 2223   GLUCOSE 92 09/09/2014  2223   BUN 11 09/09/2014 2223   CREATININE 0.75 09/09/2014 2223   CALCIUM 9.6 09/09/2014 2223   GFRNONAA >90 09/09/2014 2223   GFRAA >90 09/09/2014 2223    Lipid Panel     Component Value Date/Time   CHOL 189 02/17/2008 1053   TRIG 145 02/17/2008 1053   HDL 23.2* 02/17/2008 1053   CHOLHDL 8.1 CALC 02/17/2008 1053   VLDL 29 02/17/2008 1053   LDLCALC 137* 02/17/2008 1053    CBC    Component Value Date/Time   WBC 7.5 09/09/2014 2223   RBC 4.39 09/09/2014 2223   HGB 12.6 09/09/2014 2223   HCT 36.8 09/09/2014 2223   PLT 233 09/09/2014 2223   MCV 83.8 09/09/2014 2223   MCH 28.7 09/09/2014 2223   MCHC 34.2 09/09/2014 2223   RDW 12.9 09/09/2014 2223   LYMPHSABS 2.8 09/09/2014 2223   MONOABS 0.6 09/09/2014 2223   EOSABS 0.1 09/09/2014 2223   BASOSABS 0.0 09/09/2014 2223    Hgb A1C No results found for: HGBA1C   Assessment and Plan:

## 2014-11-10 ENCOUNTER — Ambulatory Visit: Payer: BC Managed Care – PPO

## 2014-11-10 ENCOUNTER — Encounter: Payer: Self-pay | Admitting: Obstetrics and Gynecology

## 2014-12-13 ENCOUNTER — Ambulatory Visit (INDEPENDENT_AMBULATORY_CARE_PROVIDER_SITE_OTHER): Payer: BC Managed Care – PPO | Admitting: Internal Medicine

## 2014-12-13 ENCOUNTER — Encounter: Payer: Self-pay | Admitting: Internal Medicine

## 2014-12-13 VITALS — BP 120/88 | HR 70 | Temp 98.3°F | Wt 189.0 lb

## 2014-12-13 DIAGNOSIS — F4323 Adjustment disorder with mixed anxiety and depressed mood: Secondary | ICD-10-CM

## 2014-12-13 NOTE — Progress Notes (Signed)
Subjective:    Patient ID: [name], female    DOB: 1966/10/12, 48 y.o.   MRN: [id number]  HPI  Pt presents to the clinic today to follow up anxiety. She was started on Wellbutrin and supplemental xanax at her last visit. Since that time, she reports her anxiety has gotten better. She still has days that seem to be worse than others. She has only had to take 5 of the xanax so far. She has had some headaches but are not sure if they are related. The headaches do not last long and they go away quickly with Advil.  Review of Systems      Past Medical History  Diagnosis Date  . Anxiety   . Abnormal uterine bleeding 2008    reason for hysterectomy  . Abnormal Pap smear of cervix 1991 - 1995  . Chicken pox   . Depression   . Hx of colonic polyps   . Bladder prolapse, female, acquired     Current Outpatient Prescriptions  Medication Sig Dispense Refill  . ALPRAZolam (XANAX) 0.25 MG tablet Take 1 tablet (0.25 mg total) by mouth 2 (two) times daily as needed for anxiety. 20 tablet 0  . buPROPion (WELLBUTRIN XL) 150 MG 24 hr tablet Take 1 tablet (150 mg total) by mouth daily. 30 tablet 2  . cholecalciferol (VITAMIN D) 1000 UNITS tablet Take 1,000 Units by mouth daily.    . Linoleic Acid Conjugated 1000 MG CAPS Take 3,000 mg by mouth daily.    Marland Kitchen UNABLE TO FIND 5 - HTTP with vitamin B6 Supplement  100 mg a day    . vitamin B-12 (CYANOCOBALAMIN) 1000 MCG tablet Take 1,000 mcg by mouth daily.    . vitamin C (ASCORBIC ACID) 500 MG tablet Take 500 mg by mouth daily.     No current facility-administered medications for this visit.    Allergies  Allergen Reactions  . Desvenlafaxine     REACTION: withdrawl  . Iodides Itching  . Iohexol      Code: RASH, Desc: PT STATES BACK IN THE 70S SHE HAD IV DYE REACTION.04/30/07/RM---pt given omnipaque 300 w/out pre meds; no complications 6/50/35 slg, Onset Date: [id number]     Family History  Problem Relation Age of Onset  . Hypertension Mother    . Diabetes Father   . Cancer Paternal Uncle     Pancreas  . Heart attack Maternal Grandmother   . Drug abuse Sister     History   Social History  . Marital Status: Married    Spouse Name: N/A    Number of Children: N/A  . Years of Education: N/A   Occupational History  . Not on file.   Social History Main Topics  . Smoking status: Former Research scientist (life sciences)  . Smokeless tobacco: Never Used  . Alcohol Use: No  . Drug Use: No  . Sexual Activity:    Partners: Male    Birth Control/ Protection: Surgical     Comment: Hyst   Other Topics Concern  . Not on file   Social History Narrative     Constitutional: Pt reports headache. Denies fever, malaise, fatigue, or abrupt weight changes.  Psych: Pt reports anxiety. Denies depression, SI/HI.  No other specific complaints in a complete review of systems (except as listed in HPI above).   Objective:   Physical Exam   BP 120/88 mmHg  Pulse 70  Temp(Src) 98.3 F (36.8 C) (Oral)  Wt 189 lb (85.73 kg) Wt Readings from  Last 3 Encounters:  12/13/14 189 lb (85.73 kg)  11/01/14 187 lb (84.823 kg)  10/22/14 188 lb 9.6 oz (85.548 kg)    General: Appears her stated age, well developed, well nourished in NAD. HEENT: Head: normal shape and size; Eyes: sclera white, no icterus, conjunctiva pink, PERRLA and EOMs intact; Cardiovascular: Normal rate and rhythm. S1,S2 noted.  No murmur, rubs or gallops noted.  Pulmonary/Chest: Normal effort and positive vesicular breath sounds. No respiratory distress. No wheezes, rales or ronchi noted.  Psychiatric: Mood and affect normal. Behavior is normal. Judgment and thought content normal.     BMET    Component Value Date/Time   NA 142 09/09/2014 2223   K 3.7 09/09/2014 2223   CL 104 09/09/2014 2223   CO2 26 09/09/2014 2223   GLUCOSE 92 09/09/2014 2223   BUN 11 09/09/2014 2223   CREATININE 0.75 09/09/2014 2223   CALCIUM 9.6 09/09/2014 2223   GFRNONAA >90 09/09/2014 2223   GFRAA >90 09/09/2014  2223    Lipid Panel     Component Value Date/Time   CHOL 189 02/17/2008 1053   TRIG 145 02/17/2008 1053   HDL 23.2* 02/17/2008 1053   CHOLHDL 8.1 CALC 02/17/2008 1053   VLDL 29 02/17/2008 1053   LDLCALC 137* 02/17/2008 1053    CBC    Component Value Date/Time   WBC 7.5 09/09/2014 2223   RBC 4.39 09/09/2014 2223   HGB 12.6 09/09/2014 2223   HCT 36.8 09/09/2014 2223   PLT 233 09/09/2014 2223   MCV 83.8 09/09/2014 2223   MCH 28.7 09/09/2014 2223   MCHC 34.2 09/09/2014 2223   RDW 12.9 09/09/2014 2223   LYMPHSABS 2.8 09/09/2014 2223   MONOABS 0.6 09/09/2014 2223   EOSABS 0.1 09/09/2014 2223   BASOSABS 0.0 09/09/2014 2223    Hgb A1C No results found for: HGBA1C      Assessment & Plan:

## 2014-12-13 NOTE — Progress Notes (Signed)
Pre visit review using our clinic review tool, if applicable. No additional management support is needed unless otherwise documented below in the visit note. 

## 2014-12-13 NOTE — Patient Instructions (Signed)
Generalized Anxiety Disorder Generalized anxiety disorder (GAD) is a mental disorder. It interferes with life functions, including relationships, work, and school. GAD is different from normal anxiety, which everyone experiences at some point in their lives in response to specific life events and activities. Normal anxiety actually helps us prepare for and get through these life events and activities. Normal anxiety goes away after the event or activity is over.  GAD causes anxiety that is not necessarily related to specific events or activities. It also causes excess anxiety in proportion to specific events or activities. The anxiety associated with GAD is also difficult to control. GAD can vary from mild to severe. People with severe GAD can have intense waves of anxiety with physical symptoms (panic attacks).  SYMPTOMS The anxiety and worry associated with GAD are difficult to control. This anxiety and worry are related to many life events and activities and also occur more days than not for 6 months or longer. People with GAD also have three or more of the following symptoms (one or more in children):  Restlessness.   Fatigue.  Difficulty concentrating.   Irritability.  Muscle tension.  Difficulty sleeping or unsatisfying sleep. DIAGNOSIS GAD is diagnosed through an assessment by your health care provider. Your health care provider will ask you questions aboutyour mood,physical symptoms, and events in your life. Your health care provider may ask you about your medical history and use of alcohol or drugs, including prescription medicines. Your health care provider may also do a physical exam and blood tests. Certain medical conditions and the use of certain substances can cause symptoms similar to those associated with GAD. Your health care provider may refer you to a mental health specialist for further evaluation. TREATMENT The following therapies are usually used to treat GAD:    Medication. Antidepressant medication usually is prescribed for long-term daily control. Antianxiety medicines may be added in severe cases, especially when panic attacks occur.   Talk therapy (psychotherapy). Certain types of talk therapy can be helpful in treating GAD by providing support, education, and guidance. A form of talk therapy called cognitive behavioral therapy can teach you healthy ways to think about and react to daily life events and activities.  Stress managementtechniques. These include yoga, meditation, and exercise and can be very helpful when they are practiced regularly. A mental health specialist can help determine which treatment is best for you. Some people see improvement with one therapy. However, other people require a combination of therapies. Document Released: 04/06/2013 Document Revised: 04/26/2014 Document Reviewed: 04/06/2013 Exit[employer] Patient Information 2015 [employer]. This information is not intended to replace advice given to you by your health care provider. Make sure you discuss any questions you have with your health care provider.  

## 2014-12-13 NOTE — Assessment & Plan Note (Signed)
Doing better or Wellbutrin Occasional xanax use but not too much Will refer to psychology for CBT

## 2014-12-14 ENCOUNTER — Telehealth: Payer: Self-pay | Admitting: Obstetrics and Gynecology

## 2014-12-14 ENCOUNTER — Other Ambulatory Visit: Payer: Self-pay | Admitting: Internal Medicine

## 2014-12-14 DIAGNOSIS — Z0000 Encounter for general adult medical examination without abnormal findings: Secondary | ICD-10-CM

## 2014-12-14 NOTE — Telephone Encounter (Signed)
Spoke with patient. Advised that per benefit quote received, she will be responsible for $3148.69 for the surgeons portion of the surgery. Explained: Deductible: $1750 (0 met) OOP: $4750 (0 met) Coins: 70/30 PAC: n/a Ref# [id number]  Surgeon Allowed: [phone] Assistant Allowed: $9702.63 Total Allowed: $7858.85   PR: $0277.41  Patient began to cry stating that there is no need to explain further, she does not have $3000 and disconnected the call.

## 2014-12-15 ENCOUNTER — Telehealth: Payer: Self-pay | Admitting: Obstetrics and Gynecology

## 2014-12-15 NOTE — Telephone Encounter (Signed)
See previous telephone note (closed previous note in error)  Left message for patient to call back. Need to go over 2016 benefits for urodynamics testing:  ID:  [id number]  DOB: 09-04-1966 CPT: 51729/51741/51797 Dx:    Effective Date: 01.01.2016 Termination Date: Benefit period: Pre-Existing: n/a  Date: 12.22.2015 Time: 1144 Rep: Nagengast Derry   Copay: $35 Deductible: OOP: Coins: PAC: n/a  Ref# [id number] Allowed amount:    PR: $35

## 2014-12-15 NOTE — Telephone Encounter (Signed)
Spoke with patient. Advised that per benefit quote received, she will be responsible to pay a $35 copay when she comes in for urodynamics testing. Patient relieved and agreeable.

## 2014-12-15 NOTE — Telephone Encounter (Signed)
Patient called. She apologized for "breaking down" on me yesterday. States that she was simply overwhelmed because her husband recently had surgery and they had just paid a lot out of pocket and then my calling with this dollar amount didn't help. She states that she has set aside $2500 in her FSA account in preparation for the surgery with Dr Quincy Simmonds. She asked that I go over her benefits and cost again.  Again I explained: Deductible: $1750 (0 met)  OOP: $4750 (0 met)  Coins: 70/30  PAC: n/a  Ref# [id number]  Surgeon Allowed: [phone]  Assistant Allowed: $0340.35  Total Allowed: $2481.85  PR: $9093.11  Patient would like to think this over and talk with Dr Quincy Simmonds when she comes in for AEX next week. Patient also asked that I check benefits for urodynamics testing. I advised that I would and that I would call her with that information this afternoon. Patient agreeable.

## 2014-12-20 ENCOUNTER — Ambulatory Visit (INDEPENDENT_AMBULATORY_CARE_PROVIDER_SITE_OTHER): Payer: BC Managed Care – PPO | Admitting: Obstetrics and Gynecology

## 2014-12-20 ENCOUNTER — Encounter: Payer: Self-pay | Admitting: Obstetrics and Gynecology

## 2014-12-20 VITALS — BP 128/76 | HR 66 | Ht 65.5 in | Wt 192.6 lb

## 2014-12-20 DIAGNOSIS — Z01419 Encounter for gynecological examination (general) (routine) without abnormal findings: Secondary | ICD-10-CM

## 2014-12-20 DIAGNOSIS — Z0000 Encounter for general adult medical examination without abnormal findings: Secondary | ICD-10-CM

## 2014-12-20 LAB — POCT URINALYSIS DIPSTICK
Bilirubin, UA: NEGATIVE
Blood, UA: NEGATIVE
Glucose, UA: NEGATIVE
Ketones, UA: NEGATIVE
Leukocytes, UA: NEGATIVE
Nitrite, UA: NEGATIVE
Protein, UA: NEGATIVE
Urobilinogen, UA: NEGATIVE
pH, UA: 5

## 2014-12-20 NOTE — Patient Instructions (Signed)

## 2014-12-20 NOTE — Progress Notes (Signed)
Patient ID: [name], female   DOB: 1966/06/05, 48 y.o.   MRN: [id number] 48 y.o. [id number] MarriedCaucasianF here for annual exam.   PCP:  Webb Silversmith, MD - Has wellness appointment with PCP in January 2016.  Notes pressure in pelvis/vagina with physical activity.  Reconsidering surgery versus a pessary.   Used estrogen therapy in the past.  Asking if she should return to this.  Had not used since 2009 or 2010.  No real hot flashes.   No LMP recorded. Patient has had a hysterectomy.          Sexually active: Yes.   female The current method of family planning is status post hysterectomy.    Exercising: Yes.    weight training and cardio. Smoker:  no  Health Maintenance: Pap:  06/2013 normal per patient History of abnormal Pap:  Yes - 22 years ago.  MMG:  09/2013 wnl--done in [place], [place] Colonoscopy: 05-30-07 adenomatous polyps with Dr. Oretha Caprice at [facility].  Next colonoscopy was due 05/2012. Patient to call and schedule.  BMD:    --- TDaP: 01-06-14  Screening Labs:  ---, Hb today: PCP, Urine today: Neg   reports that she has quit smoking. She has never used smokeless tobacco. She reports that she does not drink alcohol or use illicit drugs.  Past Medical History  Diagnosis Date  . Anxiety   . Abnormal uterine bleeding 2008    reason for hysterectomy  . Abnormal Pap smear of cervix 1991 - 1995  . Chicken pox   . Depression   . Hx of colonic polyps   . Bladder prolapse, female, acquired     Past Surgical History  Procedure Laterality Date  . Shoulder surgery    . Abdominal hysterectomy  08/2007    complete  . Ectopic pregnancy surgery    . Cholecystectomy    . Hernia repair    . Cervix lesion destruction  1991- 1995    Current Outpatient Prescriptions  Medication Sig Dispense Refill  . ALPRAZolam (XANAX) 0.25 MG tablet Take 1 tablet (0.25 mg total) by mouth 2 (two) times daily as needed for anxiety. 20 tablet 0  . buPROPion (WELLBUTRIN XL) 150 MG 24 hr tablet  Take 1 tablet (150 mg total) by mouth daily. 30 tablet 2  . cholecalciferol (VITAMIN D) 1000 UNITS tablet Take 1,000 Units by mouth daily.    . Linoleic Acid Conjugated 1000 MG CAPS Take 3,000 mg by mouth daily.    . vitamin B-12 (CYANOCOBALAMIN) 1000 MCG tablet Take 1,000 mcg by mouth daily.    . vitamin C (ASCORBIC ACID) 500 MG tablet Take 500 mg by mouth daily.     No current facility-administered medications for this visit.    Family History  Problem Relation Age of Onset  . Hypertension Mother   . Diabetes Father   . Cancer Paternal Uncle     Pancreas  . Heart attack Maternal Grandmother   . Drug abuse Sister     ROS:  Pertinent items are noted in HPI.  Otherwise, a comprehensive ROS was negative.  Exam:   BP 128/76 mmHg  Pulse 66  Ht 5' 5.5" (1.664 m)  Wt 192 lb 9.6 oz (87.363 kg)  BMI 31.55 kg/m2     Height: 5' 5.5" (166.4 cm)  Ht Readings from Last 3 Encounters:  12/20/14 5' 5.5" (1.664 m)  11/01/14 5' 5.5" (1.664 m)  10/22/14 5' 5.5" (1.664 m)    General appearance: alert, cooperative and  appears stated age Head: Normocephalic, without obvious abnormality, atraumatic Neck: no adenopathy, supple, symmetrical, trachea midline and thyroid normal to inspection and palpation Lungs: clear to auscultation bilaterally Breasts: normal appearance, no masses or tenderness, Inspection negative, No nipple retraction or dimpling, No nipple discharge or bleeding, No axillary or supraclavicular adenopathy Heart: regular rate and rhythm Abdomen: soft, non-tender; bowel sounds normal; no masses,  no organomegaly Extremities: extremities normal, atraumatic, no cyanosis or edema Skin: Skin color, texture, turgor normal. No rashes or lesions Lymph nodes: Cervical, supraclavicular, and axillary nodes normal. No abnormal inguinal nodes palpated Neurologic: Grossly normal   Pelvic: External genitalia:  no lesions              Urethra:  normal appearing urethra with no masses,  tenderness or lesions              Bartholins and Skenes: normal                 Vagina: normal appearing vagina with normal color and discharge, no lesions, third degree cystocele, 0 - 1 degree vault prolapse, minimal rectocele.               Cervix: absent              Pap taken: No. Bimanual Exam:  Uterus:  uterus absent              Adnexa: no mass, fullness, tenderness               Rectovaginal: Confirms               Anus:  normal sphincter tone, no lesions  Chaperone was present for exam.  A:  Well Woman with normal  Status post TAH/BSO. Incomplete vaginal prolapse.   P:   Mammogram due.  Patient will schedule at [facility]  pap smear not indicated.  Labs and flu vaccine with PCP.  Return for pessary fitting.  Discussed estrogen therapy - systemic and local vaginal - risks and benefits.  May do local vaginal estrogen with pessary.   return annually or prn

## 2014-12-21 ENCOUNTER — Other Ambulatory Visit: Payer: Self-pay

## 2014-12-21 DIAGNOSIS — Z1231 Encounter for screening mammogram for malignant neoplasm of breast: Secondary | ICD-10-CM

## 2014-12-23 ENCOUNTER — Other Ambulatory Visit (INDEPENDENT_AMBULATORY_CARE_PROVIDER_SITE_OTHER): Payer: BC Managed Care – PPO

## 2014-12-23 ENCOUNTER — Encounter: Payer: Self-pay | Admitting: Radiology

## 2014-12-23 DIAGNOSIS — Z0000 Encounter for general adult medical examination without abnormal findings: Secondary | ICD-10-CM

## 2014-12-23 LAB — LIPID PANEL
Cholesterol: 196 mg/dL (ref 0–200)
HDL: 52.3 mg/dL (ref >39.00)
LDL Cholesterol: 127 mg/dL — ABNORMAL HIGH (ref 0–99)
NonHDL: 143.7
Total CHOL/HDL Ratio: 4
Triglycerides: 85 mg/dL (ref 0.0–149.0)
VLDL: 17 mg/dL (ref 0.0–40.0)

## 2014-12-23 LAB — CBC
HCT: 38.4 % (ref 36.0–46.0)
Hemoglobin: 12.9 g/dL (ref 12.0–15.0)
MCHC: 33.5 g/dL (ref 30.0–36.0)
MCV: 85.9 fl (ref 78.0–100.0)
Platelets: 249 10*3/uL (ref 150.0–400.0)
RBC: 4.47 Mil/uL (ref 3.87–5.11)
RDW: 13.6 % (ref 11.5–15.5)
WBC: 5.3 10*3/uL (ref 4.0–10.5)

## 2014-12-23 LAB — COMPREHENSIVE METABOLIC PANEL
ALT: 23 U/L (ref 0–35)
AST: 19 U/L (ref 0–37)
Albumin: 3.8 g/dL (ref 3.5–5.2)
Alkaline Phosphatase: 80 U/L (ref 39–117)
BUN: 19 mg/dL (ref 6–23)
CO2: 29 mEq/L (ref 19–32)
Calcium: 9.8 mg/dL (ref 8.4–10.5)
Chloride: 106 mEq/L (ref 96–112)
Creatinine, Ser: 0.9 mg/dL (ref 0.4–1.2)
GFR: 68.92 mL/min (ref >60.00)
Glucose, Bld: 98 mg/dL (ref 70–99)
Potassium: 4.7 mEq/L (ref 3.5–5.1)
Sodium: 139 mEq/L (ref 135–145)
Total Bilirubin: 0.5 mg/dL (ref 0.2–1.2)
Total Protein: 7.3 g/dL (ref 6.0–8.3)

## 2014-12-24 DIAGNOSIS — N200 Calculus of kidney: Secondary | ICD-10-CM

## 2014-12-24 HISTORY — DX: Calculus of kidney: N20.0

## 2014-12-27 NOTE — Telephone Encounter (Signed)
Routing to provider for final review. Patient agreeable to disposition. Will close encounter.     

## 2014-12-29 ENCOUNTER — Encounter: Payer: Self-pay | Admitting: Obstetrics and Gynecology

## 2014-12-30 ENCOUNTER — Ambulatory Visit (INDEPENDENT_AMBULATORY_CARE_PROVIDER_SITE_OTHER): Payer: BLUE CROSS/BLUE SHIELD | Admitting: Internal Medicine

## 2014-12-30 ENCOUNTER — Encounter: Payer: Self-pay | Admitting: Internal Medicine

## 2014-12-30 VITALS — BP 124/82 | HR 86 | Temp 98.4°F | Ht 65.5 in | Wt 190.5 lb

## 2014-12-30 DIAGNOSIS — Z23 Encounter for immunization: Secondary | ICD-10-CM

## 2014-12-30 DIAGNOSIS — Z0000 Encounter for general adult medical examination without abnormal findings: Secondary | ICD-10-CM

## 2014-12-30 NOTE — Progress Notes (Signed)
Pre visit review using our clinic review tool, if applicable. No additional management support is needed unless otherwise documented below in the visit note. 

## 2014-12-30 NOTE — Progress Notes (Signed)
Subjective:    Patient ID: [name], female    DOB: 1966/08/26, 49 y.o.   MRN: [id number]  HPI  Pt presents to the clinic today for her annual exam.  Flu: yearly, wants one today Tetanus: 12/2013 LMP: Hysterectomy Pap Smear: 12/2014- pap smear not done Mammogram: Scheduled 01/10/15 Vision Screening: 12/24/14 Dentist: as needed  Diet: she watches what she eats, cutting back on calories Exercise: Starting Zumba, Water Aerobics, Cycling and strength training 5 days per week  She did just see her gyn for bladder prolapse. She opted out of the bladder tack due to cost and will be trying a pessary instead. Review of Systems      Past Medical History  Diagnosis Date  . Anxiety   . Abnormal uterine bleeding 2008    reason for hysterectomy  . Abnormal Pap smear of cervix 1991 - 1995  . Chicken pox   . Depression   . Hx of colonic polyps   . Bladder prolapse, female, acquired     Current Outpatient Prescriptions  Medication Sig Dispense Refill  . ALPRAZolam (XANAX) 0.25 MG tablet Take 1 tablet (0.25 mg total) by mouth 2 (two) times daily as needed for anxiety. 20 tablet 0  . buPROPion (WELLBUTRIN XL) 150 MG 24 hr tablet Take 1 tablet (150 mg total) by mouth daily. 30 tablet 2  . cholecalciferol (VITAMIN D) 1000 UNITS tablet Take 1,000 Units by mouth daily.    . Linoleic Acid Conjugated 1000 MG CAPS Take 3,000 mg by mouth daily.    . vitamin B-12 (CYANOCOBALAMIN) 1000 MCG tablet Take 1,000 mcg by mouth daily.    . vitamin C (ASCORBIC ACID) 500 MG tablet Take 500 mg by mouth daily.     No current facility-administered medications for this visit.    Allergies  Allergen Reactions  . Desvenlafaxine     REACTION: withdrawl  . Iodides Itching  . Iohexol      Code: RASH, Desc: PT STATES BACK IN THE 70S SHE HAD IV DYE REACTION.04/30/07/RM---pt given omnipaque 300 w/out pre meds; no complications 3/35/45 slg, Onset Date: [id number]     Family History  Problem Relation Age of  Onset  . Hypertension Mother   . Diabetes Father   . Cancer Paternal Uncle     Pancreas  . Heart attack Maternal Grandmother   . Drug abuse Sister     History   Social History  . Marital Status: Married    Spouse Name: N/A    Number of Children: N/A  . Years of Education: N/A   Occupational History  . Not on file.   Social History Main Topics  . Smoking status: Former Research scientist (life sciences)  . Smokeless tobacco: Never Used  . Alcohol Use: No  . Drug Use: No  . Sexual Activity:    Partners: Male    Birth Control/ Protection: Surgical     Comment: Hyst   Other Topics Concern  . Not on file   Social History Narrative     Constitutional: Denies fever, malaise, fatigue, headache or abrupt weight changes.  HEENT: Denies eye pain, eye redness, ear pain, ringing in the ears, wax buildup, runny nose, nasal congestion, bloody nose, or sore throat. Respiratory: Denies difficulty breathing, shortness of breath, cough or sputum production.   Cardiovascular: Denies chest pain, chest tightness, palpitations or swelling in the hands or feet.  Gastrointestinal: Denies abdominal pain, bloating, constipation, diarrhea or blood in the stool.  GU: Pt reports stress incontinence. Denies urgency,  frequency, pain with urination, burning sensation, blood in urine, odor or discharge. Musculoskeletal: Denies decrease in range of motion, difficulty with gait, muscle pain or joint pain and swelling.  Skin: Denies redness, rashes, lesions or ulcercations.  Neurological: Denies dizziness, difficulty with memory, difficulty with speech or problems with balance and coordination.   No other specific complaints in a complete review of systems (except as listed in HPI above).  Objective:   Physical Exam    BP 124/82 mmHg  Pulse 86  Temp(Src) 98.4 F (36.9 C) (Oral)  Ht 5' 5.5" (1.664 m)  Wt 190 lb 8 oz (86.41 kg)  BMI 31.21 kg/m2  SpO2 98% Wt Readings from Last 3 Encounters:  12/30/14 190 lb 8 oz (86.41  kg)  12/20/14 192 lb 9.6 oz (87.363 kg)  12/13/14 189 lb (85.73 kg)    General: Appears her stated age, obese but well developed, well nourished in NAD. Skin: Warm, dry and intact. Mild acne noted on face. HEENT: Head: normal shape and size; Eyes: sclera white, no icterus, conjunctiva pink, PERRLA and EOMs intact; Ears: Tm's gray and intact, scar tissue noted on bilateral TM's, normal light reflex; Nose: mucosa pink and moist, septum midline; Throat/Mouth: Teeth present, mucosa pink and moist, no exudate, lesions or ulcerations noted.  Neck:  Neck supple, trachea midline. No masses, lumps or thyromegaly present.  Cardiovascular: Normal rate and rhythm. S1,S2 noted.  No murmur, rubs or gallops noted. No JVD or BLE edema. No carotid bruits noted. Pulmonary/Chest: Normal effort and positive vesicular breath sounds. No respiratory distress. No wheezes, rales or ronchi noted.  Abdomen: Soft and nontender. Normal bowel sounds, no bruits noted. No distention or masses noted. Liver, spleen and kidneys non palpable. Musculoskeletal: Normal range of motion. Strength 5/5 BUE/BLE. No difficulty with gait.  Neurological: Alert and oriented. Cranial nerves II-XII grossly intact. Coordination normal.  Psychiatric: Mood and affect normal. Behavior is normal. Judgment and thought content normal.     BMET    Component Value Date/Time   NA 139 12/23/2014 1033   K 4.7 12/23/2014 1033   CL 106 12/23/2014 1033   CO2 29 12/23/2014 1033   GLUCOSE 98 12/23/2014 1033   BUN 19 12/23/2014 1033   CREATININE 0.9 12/23/2014 1033   CALCIUM 9.8 12/23/2014 1033   GFRNONAA >90 09/09/2014 2223   GFRAA >90 09/09/2014 2223    Lipid Panel     Component Value Date/Time   CHOL 196 12/23/2014 1033   TRIG 85.0 12/23/2014 1033   HDL 52.30 12/23/2014 1033   CHOLHDL 4 12/23/2014 1033   VLDL 17.0 12/23/2014 1033   LDLCALC 127* 12/23/2014 1033    CBC    Component Value Date/Time   WBC 5.3 12/23/2014 1033   RBC 4.47  12/23/2014 1033   HGB 12.9 12/23/2014 1033   HCT 38.4 12/23/2014 1033   PLT 249.0 12/23/2014 1033   MCV 85.9 12/23/2014 1033   MCH 28.7 09/09/2014 2223   MCHC 33.5 12/23/2014 1033   RDW 13.6 12/23/2014 1033   LYMPHSABS 2.8 09/09/2014 2223   MONOABS 0.6 09/09/2014 2223   EOSABS 0.1 09/09/2014 2223   BASOSABS 0.0 09/09/2014 2223    Hgb A1C No results found for: HGBA1C     Assessment & Plan:   Preventative Health Maintenance:  Encouraged her to work on diet and exercise Labs reviewed- normal Encouraged her to visit a dentist once yearly Flu shot today  RTC in 1 year for annual physical

## 2014-12-30 NOTE — Patient Instructions (Signed)

## 2014-12-30 NOTE — Addendum Note (Signed)
Addended by: Lurlean Nanny on: 12/30/2014 11:18 AM   Modules accepted: Orders

## 2014-12-31 NOTE — Addendum Note (Signed)
Addended by: Lurlean Nanny on: 12/31/2014 09:02 AM   Modules accepted: Orders

## 2015-01-03 ENCOUNTER — Ambulatory Visit: Payer: BC Managed Care – PPO | Admitting: Internal Medicine

## 2015-01-06 ENCOUNTER — Telehealth: Payer: Self-pay | Admitting: Obstetrics and Gynecology

## 2015-01-06 NOTE — Telephone Encounter (Signed)
Spoke with patient. Appointment scheduled for pessary fitting on 01/24/15 at 1pm with Dr.Silva. Patient is agreeable to date and time.  Routing to provider for final review. Patient agreeable to disposition. Will close encounter

## 2015-01-06 NOTE — Telephone Encounter (Signed)
Pt called to schedule pessary fitting. Please reference mychart email between pt and Dr Quincy Simmonds. Pt states the mychart message is the better way to be in touch with her, but her mobile number is also an option.

## 2015-01-07 ENCOUNTER — Encounter: Payer: Self-pay | Admitting: Internal Medicine

## 2015-01-07 ENCOUNTER — Other Ambulatory Visit: Payer: Self-pay | Admitting: Internal Medicine

## 2015-01-07 MED ORDER — BUPROPION HCL ER (XL) 300 MG PO TB24: 300.0000 mg | ORAL_TABLET | Freq: Every day | ORAL | Status: DC

## 2015-01-10 ENCOUNTER — Ambulatory Visit: Payer: BC Managed Care – PPO

## 2015-01-17 ENCOUNTER — Ambulatory Visit: Payer: BLUE CROSS/BLUE SHIELD

## 2015-01-24 ENCOUNTER — Ambulatory Visit (INDEPENDENT_AMBULATORY_CARE_PROVIDER_SITE_OTHER): Payer: BLUE CROSS/BLUE SHIELD | Admitting: Obstetrics and Gynecology

## 2015-01-24 ENCOUNTER — Encounter: Payer: Self-pay | Admitting: Obstetrics and Gynecology

## 2015-01-24 ENCOUNTER — Ambulatory Visit
Admission: RE | Admit: 2015-01-24 | Discharge: 2015-01-24 | Disposition: A | Discharge status: Home / Self Care | Payer: BLUE CROSS/BLUE SHIELD | Source: Ambulatory Visit

## 2015-01-24 ENCOUNTER — Ambulatory Visit: Admission: RE | Admit: 2015-01-24 | Payer: BLUE CROSS/BLUE SHIELD | Source: Ambulatory Visit

## 2015-01-24 VITALS — BP 130/76 | HR 70 | Ht 65.5 in | Wt 187.2 lb

## 2015-01-24 DIAGNOSIS — N816 Rectocele: Secondary | ICD-10-CM

## 2015-01-24 DIAGNOSIS — IMO0002 Reserved for concepts with insufficient information to code with codable children: Secondary | ICD-10-CM

## 2015-01-24 DIAGNOSIS — N8110 Cystocele, unspecified: Secondary | ICD-10-CM

## 2015-01-24 DIAGNOSIS — Z1231 Encounter for screening mammogram for malignant neoplasm of breast: Secondary | ICD-10-CM

## 2015-01-24 DIAGNOSIS — N760 Acute vaginitis: Secondary | ICD-10-CM

## 2015-01-24 MED ORDER — METRONIDAZOLE 500 MG PO TABS: 500.0000 mg | ORAL_TABLET | Freq: Two times a day (BID) | ORAL | Status: DC

## 2015-01-24 NOTE — Patient Instructions (Signed)
Bacterial Vaginosis Bacterial vaginosis is a vaginal infection that occurs when the normal balance of bacteria in the vagina is disrupted. It results from an overgrowth of certain bacteria. This is the most common vaginal infection in women of childbearing age. Treatment is important to prevent complications, especially in pregnant women, as it can cause a premature delivery. CAUSES  Bacterial vaginosis is caused by an increase in harmful bacteria that are normally present in smaller amounts in the vagina. Several different kinds of bacteria can cause bacterial vaginosis. However, the reason that the condition develops is not fully understood. RISK FACTORS Certain activities or behaviors can put you at an increased risk of developing bacterial vaginosis, including:  Having a new sex partner or multiple sex partners.  Douching.  Using an intrauterine device (IUD) for contraception. Women do not get bacterial vaginosis from toilet seats, bedding, swimming pools, or contact with objects around them. SIGNS AND SYMPTOMS  Some women with bacterial vaginosis have no signs or symptoms. Common symptoms include:  Grey vaginal discharge.  A fishlike odor with discharge, especially after sexual intercourse.  Itching or burning of the vagina and vulva.  Burning or pain with urination. DIAGNOSIS  Your health care provider will take a medical history and examine the vagina for signs of bacterial vaginosis. A sample of vaginal fluid may be taken. Your health care provider will look at this sample under a microscope to check for bacteria and abnormal cells. A vaginal pH test may also be done.  TREATMENT  Bacterial vaginosis may be treated with antibiotic medicines. These may be given in the form of a pill or a vaginal cream. A second round of antibiotics may be prescribed if the condition comes back after treatment.  HOME CARE INSTRUCTIONS   Only take over-the-counter or prescription medicines as  directed by your health care provider.  If antibiotic medicine was prescribed, take it as directed. Make sure you finish it even if you start to feel better.  Do not have sex until treatment is completed.  Tell all sexual partners that you have a vaginal infection. They should see their health care provider and be treated if they have problems, such as a mild rash or itching.  Practice safe sex by using condoms and only having one sex partner. SEEK MEDICAL CARE IF:   Your symptoms are not improving after 3 days of treatment.  You have increased discharge or pain.  You have a fever. MAKE SURE YOU:   Understand these instructions.  Will watch your condition.  Will get help right away if you are not doing well or get worse. FOR MORE INFORMATION  Centers for Disease Control and Prevention, Division of STD Prevention: AppraiserFraud.fi American Sexual Health Association (ASHA): www.ashastd.org  Document Released: 12/10/2005 Document Revised: 09/30/2013 Document Reviewed: 07/22/2013 [facility] Patient Information 2015 [place], [place]. This information is not intended to replace advice given to you by your health care provider. Make sure you discuss any questions you have with your health care provider.   Metronidazole tablets or capsules What is this medicine? METRONIDAZOLE (me troe NI da zole) is an antiinfective. It is used to treat certain kinds of bacterial and protozoal infections. It will not work for colds, flu, or other viral infections. This medicine may be used for other purposes; ask your health care provider or pharmacist if you have questions. COMMON BRAND NAME(S): Flagyl What should I tell my health care provider before I take this medicine? They need to know if you have  any of these conditions: -anemia or other blood disorders -disease of the nervous system -fungal or yeast infection -if you drink alcohol containing drinks -liver disease -seizures -an unusual or  allergic reaction to metronidazole, or other medicines, foods, dyes, or preservatives -pregnant or trying to get pregnant -breast-feeding How should I use this medicine? Take this medicine by mouth with a full glass of water. Follow the directions on the prescription label. Take your medicine at regular intervals. Do not take your medicine more often than directed. Take all of your medicine as directed even if you think you are better. Do not skip doses or stop your medicine early. Talk to your pediatrician regarding the use of this medicine in children. Special care may be needed. Overdosage: If you think you have taken too much of this medicine contact a poison control center or emergency room at once. NOTE: This medicine is only for you. Do not share this medicine with others. What if I miss a dose? If you miss a dose, take it as soon as you can. If it is almost time for your next dose, take only that dose. Do not take double or extra doses. What may interact with this medicine? Do not take this medicine with any of the following medications: -alcohol or any product that contains alcohol -amprenavir oral solution -cisapride -disulfiram -dofetilide -dronedarone -paclitaxel injection -pimozide -ritonavir oral solution -sertraline oral solution -sulfamethoxazole-trimethoprim injection -thioridazine -ziprasidone This medicine may also interact with the following medications: -birth control pills -cimetidine -lithium -other medicines that prolong the QT interval (cause an abnormal heart rhythm) -phenobarbital -phenytoin -warfarin This list may not describe all possible interactions. Give your health care provider a list of all the medicines, herbs, non-prescription drugs, or dietary supplements you use. Also tell them if you smoke, drink alcohol, or use illegal drugs. Some items may interact with your medicine. What should I watch for while using this medicine? Tell your doctor or  health care professional if your symptoms do not improve or if they get worse. You may get drowsy or dizzy. Do not drive, use machinery, or do anything that needs mental alertness until you know how this medicine affects you. Do not stand or sit up quickly, especially if you are an older patient. This reduces the risk of dizzy or fainting spells. Avoid alcoholic drinks while you are taking this medicine and for three days afterward. Alcohol may make you feel dizzy, sick, or flushed. If you are being treated for a sexually transmitted disease, avoid sexual contact until you have finished your treatment. Your sexual partner may also need treatment. What side effects may I notice from receiving this medicine? Side effects that you should report to your doctor or health care professional as soon as possible: -allergic reactions like skin rash or hives, swelling of the face, lips, or tongue -confusion, clumsiness -difficulty speaking -discolored or sore mouth -dizziness -fever, infection -numbness, tingling, pain or weakness in the hands or feet -trouble passing urine or change in the amount of urine -redness, blistering, peeling or loosening of the skin, including inside the mouth -seizures -unusually weak or tired -vaginal irritation, dryness, or discharge Side effects that usually do not require medical attention (report to your doctor or health care professional if they continue or are bothersome): -diarrhea -headache -irritability -metallic taste -nausea -stomach pain or cramps -trouble sleeping This list may not describe all possible side effects. Call your doctor for medical advice about side effects. You may report side effects to  FDA at 1-800-FDA-1088. Where should I keep my medicine? Keep out of the reach of children. Store at room temperature below 25 degrees C (77 degrees F). Protect from light. Keep container tightly closed. Throw away any unused medicine after the expiration  date. NOTE: This sheet is a summary. It may not cover all possible information. If you have questions about this medicine, talk to your doctor, pharmacist, or health care provider.  2015, Elsevier/Gold Standard. (2013-07-17 14:08:39)

## 2015-01-24 NOTE — Progress Notes (Signed)
Patient ID: [name], female   DOB: [birth year], 49 y.o.   MRN: [id number] GYNECOLOGY  VISIT   HPI: 49 y.o.   Married  Caucasian  female   [phone] with No LMP recorded. Patient has had a hysterectomy.   here for pessary fitting.  No leakage of urine.  Notes some vaginal odor every couple of weeks.  Not associated with intercourse.   GYNECOLOGIC HISTORY: No LMP recorded. Patient has had a hysterectomy. Contraception: Hysterectomy  Menopausal hormone therapy: none        OB History    Gravida Para Term Preterm AB TAB SAB Ectopic Multiple Living   3 2 2  1   1  2          Patient Active Problem List   Diagnosis Date Noted  . Adjustment disorder with anxious mood 11/01/2014  . DYSTHYMIC DISORDER 05/25/2009  . ACNE VULGARIS, FACIAL 05/25/2009  . SEBACEOUS CYST, INFECTED 04/26/2009  . DERMATITIS, ATOPIC 12/29/2008  . RESTLESS LEG SYNDROME 09/06/2008  . TRANSIENT DISORDER INITIATING/MAINTAINING SLEEP 07/23/2008  . COLONIC POLYPS, HYPERPLASTIC 05/15/2008  . HEMORRHOIDS, INTERNAL 05/15/2008  . UMBILICAL HERNIA 13/07/6577  . MIGRAINE HEADACHE 02/26/2008  . NEPHROLITHIASIS, HX OF 09/29/2007    Past Medical History  Diagnosis Date  . Anxiety   . Abnormal uterine bleeding 2008    reason for hysterectomy  . Abnormal Pap smear of cervix 1991 - 1995  . Chicken pox   . Depression   . Hx of colonic polyps   . Bladder prolapse, female, acquired     Past Surgical History  Procedure Laterality Date  . Shoulder surgery    . Abdominal hysterectomy  08/2007    complete  . Ectopic pregnancy surgery    . Cholecystectomy    . Hernia repair    . Cervix lesion destruction  1991- 1995    Current Outpatient Prescriptions  Medication Sig Dispense Refill  . ALPRAZolam (XANAX) 0.25 MG tablet Take 1 tablet (0.25 mg total) by mouth 2 (two) times daily as needed for anxiety. 20 tablet 0  . buPROPion (WELLBUTRIN XL) 300 MG 24 hr tablet Take 1 tablet (300 mg total) by mouth daily. 30 tablet  2  . cholecalciferol (VITAMIN D) 1000 UNITS tablet Take 1,000 Units by mouth daily.    . Linoleic Acid Conjugated 1000 MG CAPS Take 3,000 mg by mouth daily.    . vitamin B-12 (CYANOCOBALAMIN) 1000 MCG tablet Take 1,000 mcg by mouth daily.    . vitamin C (ASCORBIC ACID) 500 MG tablet Take 500 mg by mouth daily.     No current facility-administered medications for this visit.     ALLERGIES: Desvenlafaxine; Iodides; and Iohexol  Family History  Problem Relation Age of Onset  . Hypertension Mother   . Diabetes Father   . Cancer Paternal Uncle     Pancreas  . Heart attack Maternal Grandmother   . Drug abuse Sister     History   Social History  . Marital Status: Married    Spouse Name: N/A    Number of Children: N/A  . Years of Education: N/A   Occupational History  . Not on file.   Social History Main Topics  . Smoking status: Former Research scientist (life sciences)  . Smokeless tobacco: Never Used  . Alcohol Use: No  . Drug Use: No  . Sexual Activity:    Partners: Male    Birth Control/ Protection: Surgical     Comment: Hyst   Other Topics Concern  .  Not on file   Social History Narrative    ROS:  Pertinent items are noted in HPI.  PHYSICAL EXAMINATION:    BP 130/76 mmHg  Pulse 70  Ht 5' 5.5" (1.664 m)  Wt 187 lb 3.2 oz (84.913 kg)  BMI 30.67 kg/m2     General appearance: alert, cooperative and appears stated age   Pelvic: External genitalia:  no lesions              Urethra:  normal appearing urethra with no masses, tenderness or lesions              Bartholins and Skenes: normal                 Vagina: normal appearing vagina with normal color and discharge, no lesions,  Third degree cystocele, first degree vault prolapse, minimal rectocele.              Cervix:  absent                   Bimanual Exam:  Uterus:   absent                                      Adnexa:  no masses                                      Multiple pessaries tried including 4-5-6 rings with support, 2  1/2 Gelhorn, incontinence dish, and donut pessaries.  Patient able to push them out with valsalva or they were uncomfortable.   After already having been examined, the patient indicates that she has been experiencing vaginal odor.   ASSESSMENT  Incomplete vaginal prolapse. Status post hysterectomy. Vaginitis.  Probable bacterial vaginosis.   PLAN  I do not recommend a pessary at this time.  I believe that it will be difficult for the patient to maintain it vaginally.  Discussion with patient regarding prolapse and possible approach with surgical intervention if desired in future.  Discussed stool softeners and avoidance of heavy lifting in order to reduce progression of prolapse. ACOG surgical handouts have been provided to patient.  Flagyl 500 mg po bid for one week.  Return prn.   An After Visit Summary was printed and given to the patient.  _25_____ minutes face to face time of which over 50% was spent in counseling.

## 2015-02-01 ENCOUNTER — Ambulatory Visit: Payer: BLUE CROSS/BLUE SHIELD | Admitting: Psychology

## 2015-02-03 ENCOUNTER — Encounter: Payer: Self-pay | Admitting: Internal Medicine

## 2015-02-04 ENCOUNTER — Other Ambulatory Visit: Payer: Self-pay | Admitting: Internal Medicine

## 2015-02-04 DIAGNOSIS — F4322 Adjustment disorder with anxiety: Secondary | ICD-10-CM

## 2015-02-04 MED ORDER — ALPRAZOLAM 0.25 MG PO TABS: 0.2500 mg | ORAL_TABLET | Freq: Two times a day (BID) | ORAL | Status: DC | PRN

## 2015-02-04 NOTE — Telephone Encounter (Signed)
Rx called in to pharmacy. 

## 2015-02-14 ENCOUNTER — Encounter: Payer: Self-pay | Admitting: Internal Medicine

## 2015-02-14 ENCOUNTER — Ambulatory Visit (INDEPENDENT_AMBULATORY_CARE_PROVIDER_SITE_OTHER): Payer: BLUE CROSS/BLUE SHIELD | Admitting: Internal Medicine

## 2015-02-14 VITALS — BP 120/72 | HR 68 | Temp 97.8°F | Wt 191.0 lb

## 2015-02-14 DIAGNOSIS — F39 Unspecified mood [affective] disorder: Secondary | ICD-10-CM

## 2015-02-14 DIAGNOSIS — F4322 Adjustment disorder with anxiety: Secondary | ICD-10-CM

## 2015-02-14 DIAGNOSIS — R4586 Emotional lability: Secondary | ICD-10-CM

## 2015-02-14 LAB — TSH: TSH: 1.6 u[IU]/mL (ref 0.35–4.50)

## 2015-02-14 MED ORDER — SERTRALINE HCL 50 MG PO TABS: 50.0000 mg | ORAL_TABLET | Freq: Every day | ORAL | Status: DC

## 2015-02-14 NOTE — Patient Instructions (Signed)
Generalized Anxiety Disorder Generalized anxiety disorder (GAD) is a mental disorder. It interferes with life functions, including relationships, work, and school. GAD is different from normal anxiety, which everyone experiences at some point in their lives in response to specific life events and activities. Normal anxiety actually helps us prepare for and get through these life events and activities. Normal anxiety goes away after the event or activity is over.  GAD causes anxiety that is not necessarily related to specific events or activities. It also causes excess anxiety in proportion to specific events or activities. The anxiety associated with GAD is also difficult to control. GAD can vary from mild to severe. People with severe GAD can have intense waves of anxiety with physical symptoms (panic attacks).  SYMPTOMS The anxiety and worry associated with GAD are difficult to control. This anxiety and worry are related to many life events and activities and also occur more days than not for 6 months or longer. People with GAD also have three or more of the following symptoms (one or more in children):  Restlessness.   Fatigue.  Difficulty concentrating.   Irritability.  Muscle tension.  Difficulty sleeping or unsatisfying sleep. DIAGNOSIS GAD is diagnosed through an assessment by your health care provider. Your health care provider will ask you questions aboutyour mood,physical symptoms, and events in your life. Your health care provider may ask you about your medical history and use of alcohol or drugs, including prescription medicines. Your health care provider may also do a physical exam and blood tests. Certain medical conditions and the use of certain substances can cause symptoms similar to those associated with GAD. Your health care provider may refer you to a mental health specialist for further evaluation. TREATMENT The following therapies are usually used to treat GAD:    Medication. Antidepressant medication usually is prescribed for long-term daily control. Antianxiety medicines may be added in severe cases, especially when panic attacks occur.   Talk therapy (psychotherapy). Certain types of talk therapy can be helpful in treating GAD by providing support, education, and guidance. A form of talk therapy called cognitive behavioral therapy can teach you healthy ways to think about and react to daily life events and activities.  Stress managementtechniques. These include yoga, meditation, and exercise and can be very helpful when they are practiced regularly. A mental health specialist can help determine which treatment is best for you. Some people see improvement with one therapy. However, other people require a combination of therapies. Document Released: 04/06/2013 Document Revised: 04/26/2014 Document Reviewed: 04/06/2013 Exit[employer] Patient Information 2015 [employer]. This information is not intended to replace advice given to you by your health care provider. Make sure you discuss any questions you have with your health care provider.  

## 2015-02-14 NOTE — Assessment & Plan Note (Signed)
She has had complete hysterectomy, no sense in checking FSH/LH Will check TSH If normal, consider adding low dose Zoloft to Wellbutrin Support offered today

## 2015-02-14 NOTE — Progress Notes (Signed)
Subjective:    Patient ID: [name], female    DOB: [birth year], 49 y.o.   MRN: [id number]  HPI [name] is a 49 year old female who presents today with chief complaint of mood swings and anxiety.  She says she feels like her moods are up and down and she has frequent crying episodes.  She denies any new recent stressors in her life.  She does report that her husband started a new job and is not able to spend as much time with her.  She had a total hysterectomy in 2008.     Review of Systems  Constitutional: Negative for fever, chills and fatigue.  HENT: Negative for congestion, postnasal drip, rhinorrhea and sore throat.   Respiratory: Negative for cough and shortness of breath.   Cardiovascular: Negative for chest pain, palpitations and leg swelling.  Gastrointestinal: Negative for abdominal pain, diarrhea and constipation.  Musculoskeletal: Negative for back pain, gait problem and neck pain.  Neurological: Negative for light-headedness and headaches.  Psychiatric/Behavioral: The patient is nervous/anxious.        Frequent tearful episodes.        Objective:   Physical Exam  Constitutional: She is oriented to person, place, and time. She appears well-developed and well-nourished.  HENT:  Head: Normocephalic and atraumatic.  Right Ear: External ear normal.  Left Ear: External ear normal.  Mouth/Throat: Oropharynx is clear and moist.  Neck: Normal range of motion. Neck supple. No thyromegaly present.  Cardiovascular: Normal rate, regular rhythm and normal heart sounds.   No murmur heard. Pulmonary/Chest: Effort normal and breath sounds normal.  Musculoskeletal: Normal range of motion.  Lymphadenopathy:    She has no cervical adenopathy.  Neurological: She is alert and oriented to person, place, and time.  Skin: Skin is warm and dry.  Psychiatric:  Expresses concern over her moods, feels like she is not herself.    Past Medical History  Diagnosis Date  . Anxiety   .  Abnormal uterine bleeding 2008    reason for hysterectomy  . Abnormal Pap smear of cervix 1991 - 1995  . Chicken pox   . Depression   . Hx of colonic polyps   . Bladder prolapse, female, acquired    Family History  Problem Relation Age of Onset  . Hypertension Mother   . Diabetes Father   . Cancer Paternal Uncle     Pancreas  . Heart attack Maternal Grandmother   . Drug abuse Sister    Current Outpatient Prescriptions on File Prior to Visit  Medication Sig Dispense Refill  . ALPRAZolam (XANAX) 0.25 MG tablet Take 1 tablet (0.25 mg total) by mouth 2 (two) times daily as needed for anxiety. 20 tablet 0  . buPROPion (WELLBUTRIN XL) 300 MG 24 hr tablet Take 1 tablet (300 mg total) by mouth daily. 30 tablet 2  . cholecalciferol (VITAMIN D) 1000 UNITS tablet Take 1,000 Units by mouth daily.    . Linoleic Acid Conjugated 1000 MG CAPS Take 3,000 mg by mouth daily.    . vitamin B-12 (CYANOCOBALAMIN) 1000 MCG tablet Take 1,000 mcg by mouth daily.    . vitamin C (ASCORBIC ACID) 500 MG tablet Take 500 mg by mouth daily.     No current facility-administered medications on file prior to visit.           Assessment & Plan:  1. Anxiety - Will check TSH as patient is status post total hysterectomy and checking FSH and LH will not  be helpful.  Will add Effexor to Wellbutrin if TSH is normal.  Continue with 300 mg Wellbutrin daily.

## 2015-02-14 NOTE — Progress Notes (Signed)
Pre visit review using our clinic review tool, if applicable. No additional management support is needed unless otherwise documented below in the visit note. 

## 2015-02-14 NOTE — Addendum Note (Signed)
Addended by: Jearld Fenton on: 02/14/2015 02:00 PM   Modules accepted: Orders

## 2015-02-14 NOTE — Progress Notes (Signed)
Subjective:    Patient ID: [name], female    DOB: 04-07-1966, 50 y.o.   MRN: [id number]  HPI  Pt presents to the clinic today with c/o mood swings. She reports she continues to have her ups and downs. She will have crying spells and become very anxious. She reports the Wellbutrin and Xanax does help but she wants to make sure something else is not going on. She denies SI/HI. She did have a complete hysterectomy in 2008. She did experience mood swings at that time. She was put on Lexapro but stopped due to weight gain. She reports her husband and her are spending less time together and this is part of what triggers her anxiety.  Review of Systems      Past Medical History  Diagnosis Date  . Anxiety   . Abnormal uterine bleeding 2008    reason for hysterectomy  . Abnormal Pap smear of cervix 1991 - 1995  . Chicken pox   . Depression   . Hx of colonic polyps   . Bladder prolapse, female, acquired     Current Outpatient Prescriptions  Medication Sig Dispense Refill  . ALPRAZolam (XANAX) 0.25 MG tablet Take 1 tablet (0.25 mg total) by mouth 2 (two) times daily as needed for anxiety. 20 tablet 0  . buPROPion (WELLBUTRIN XL) 300 MG 24 hr tablet Take 1 tablet (300 mg total) by mouth daily. 30 tablet 2  . cholecalciferol (VITAMIN D) 1000 UNITS tablet Take 1,000 Units by mouth daily.    . Linoleic Acid Conjugated 1000 MG CAPS Take 3,000 mg by mouth daily.    . vitamin B-12 (CYANOCOBALAMIN) 1000 MCG tablet Take 1,000 mcg by mouth daily.    . vitamin C (ASCORBIC ACID) 500 MG tablet Take 500 mg by mouth daily.     No current facility-administered medications for this visit.    Allergies  Allergen Reactions  . Desvenlafaxine     REACTION: withdrawl  . Iodides Itching  . Iohexol      Code: RASH, Desc: PT STATES BACK IN THE 70S SHE HAD IV DYE REACTION.04/30/07/RM---pt given omnipaque 300 w/out pre meds; no complications 6/81/15 slg, Onset Date: [id number]     Family History    Problem Relation Age of Onset  . Hypertension Mother   . Diabetes Father   . Cancer Paternal Uncle     Pancreas  . Heart attack Maternal Grandmother   . Drug abuse Sister     History   Social History  . Marital Status: Married    Spouse Name: N/A  . Number of Children: N/A  . Years of Education: N/A   Occupational History  . Not on file.   Social History Main Topics  . Smoking status: Former Research scientist (life sciences)  . Smokeless tobacco: Never Used  . Alcohol Use: No  . Drug Use: No  . Sexual Activity:    Partners: Male    Birth Control/ Protection: Surgical     Comment: Hyst   Other Topics Concern  . Not on file   Social History Narrative     Constitutional: Denies fever, malaise, fatigue, headache or abrupt weight changes.  Respiratory: Denies difficulty breathing, shortness of breath, cough or sputum production.   Cardiovascular: Denies chest pain, chest tightness, palpitations or swelling in the hands or feet.  Neurological: Denies dizziness, difficulty with memory, difficulty with speech or problems with balance and coordination.  Psych: Pt reports mood swings and anxiety. Denies depression, SI/HI.  No other specific  complaints in a complete review of systems (except as listed in HPI above).   Objective:   Physical Exam   BP 120/72 mmHg  Pulse 68  Temp(Src) 97.8 F (36.6 C) (Oral)  Wt 191 lb (86.637 kg)  SpO2 99% Wt Readings from Last 3 Encounters:  02/14/15 191 lb (86.637 kg)  01/24/15 187 lb 3.2 oz (84.913 kg)  12/30/14 190 lb 8 oz (86.41 kg)    General: Appears her stated age, well developed, well nourished in NAD. Cardiovascular: Normal rate and rhythm. S1,S2 noted.  No murmur, rubs or gallops noted. No JVD or BLE edema. No carotid bruits noted. Pulmonary/Chest: Normal effort and positive vesicular breath sounds. No respiratory distress. No wheezes, rales or ronchi noted.  Neurological: Alert and oriented. Cranial nerves II-XII intact. Coordination normal.  +DTRs bilaterally. Psychiatric: Mood slightly tearful but affect normal. Behavior is normal. Judgment and thought content normal.     BMET    Component Value Date/Time   NA 139 12/23/2014 1033   K 4.7 12/23/2014 1033   CL 106 12/23/2014 1033   CO2 29 12/23/2014 1033   GLUCOSE 98 12/23/2014 1033   BUN 19 12/23/2014 1033   CREATININE 0.9 12/23/2014 1033   CALCIUM 9.8 12/23/2014 1033   GFRNONAA >90 09/09/2014 2223   GFRAA >90 09/09/2014 2223    Lipid Panel     Component Value Date/Time   CHOL 196 12/23/2014 1033   TRIG 85.0 12/23/2014 1033   HDL 52.30 12/23/2014 1033   CHOLHDL 4 12/23/2014 1033   VLDL 17.0 12/23/2014 1033   LDLCALC 127* 12/23/2014 1033    CBC    Component Value Date/Time   WBC 5.3 12/23/2014 1033   RBC 4.47 12/23/2014 1033   HGB 12.9 12/23/2014 1033   HCT 38.4 12/23/2014 1033   PLT 249.0 12/23/2014 1033   MCV 85.9 12/23/2014 1033   MCH 28.7 09/09/2014 2223   MCHC 33.5 12/23/2014 1033   RDW 13.6 12/23/2014 1033   LYMPHSABS 2.8 09/09/2014 2223   MONOABS 0.6 09/09/2014 2223   EOSABS 0.1 09/09/2014 2223   BASOSABS 0.0 09/09/2014 2223    Hgb A1C No results found for: HGBA1C      Assessment & Plan:

## 2015-02-21 ENCOUNTER — Encounter: Payer: Self-pay | Admitting: Internal Medicine

## 2015-02-25 ENCOUNTER — Ambulatory Visit: Payer: BC Managed Care – PPO | Admitting: Obstetrics and Gynecology

## 2015-03-03 ENCOUNTER — Encounter (HOSPITAL_COMMUNITY): Payer: Self-pay | Admitting: Emergency Medicine

## 2015-03-03 ENCOUNTER — Emergency Department (HOSPITAL_COMMUNITY)
Admission: EM | Admit: 2015-03-03 | Discharge: 2015-03-03 | Disposition: A | Discharge status: Home / Self Care | Payer: BLUE CROSS/BLUE SHIELD | Source: Home / Self Care | Attending: Family Medicine | Admitting: Family Medicine

## 2015-03-03 ENCOUNTER — Ambulatory Visit: Payer: BLUE CROSS/BLUE SHIELD | Admitting: Family Medicine

## 2015-03-03 DIAGNOSIS — J069 Acute upper respiratory infection, unspecified: Secondary | ICD-10-CM

## 2015-03-03 LAB — POCT RAPID STREP A: Streptococcus, Group A Screen (Direct): NEGATIVE

## 2015-03-03 NOTE — ED Notes (Signed)
C/o  Sore throat that started late last night.  Woke this a.m with pain in the left ear.  States just over the past two hours has developed left sided chest tightness/pain.  Mild sob.  Nausea.  No otc meds taken.   Denies fever, n/v/d.

## 2015-03-03 NOTE — ED Provider Notes (Addendum)
CSN: [id number]     Arrival date & time 03/03/15  7867 History   First MD Initiated Contact with Patient 03/03/15 1014     Chief Complaint  Patient presents with  . URI  . Chest Pain   (Consider location/radiation/quality/duration/timing/severity/associated sxs/prior Treatment) HPI Comments: Reports herself to be otherwise healthy Nonsmoker Has not tried any meds at home for symptoms Has appointment with her PCP this afternoon at 230p. Works as Passenger transport manager Did receive flu shot for this season  Patient is a 49 y.o. female presenting with URI and chest pain. The history is provided by the patient.  URI Presenting symptoms: cough, ear pain and sore throat   Presenting symptoms: no facial pain and no fatigue   Severity:  Moderate Onset quality:  Gradual Duration:  16 hours Timing:  Constant Progression:  Worsening Chronicity:  New Associated symptoms: myalgias   Associated symptoms comment:  +nausea. No vomiting or diarrhea Risk factors: sick contacts   Risk factors comment:  States multiple co-workers have been ill recently Chest Pain Associated symptoms: cough   Associated symptoms: no fatigue     Past Medical History  Diagnosis Date  . Anxiety   . Abnormal uterine bleeding 2008    reason for hysterectomy  . Abnormal Pap smear of cervix 1991 - 1995  . Chicken pox   . Depression   . Hx of colonic polyps   . Bladder prolapse, female, acquired    Past Surgical History  Procedure Laterality Date  . Shoulder surgery    . Abdominal hysterectomy  08/2007    complete  . Ectopic pregnancy surgery    . Cholecystectomy    . Hernia repair    . Cervix lesion destruction  1991- 1995   Family History  Problem Relation Age of Onset  . Hypertension Mother   . Diabetes Father   . Cancer Paternal Uncle     Pancreas  . Heart attack Maternal Grandmother   . Drug abuse Sister    History  Substance Use Topics  . Smoking status: Former Research scientist (life sciences)  . Smokeless tobacco: Never  Used  . Alcohol Use: No   OB History    Gravida Para Term Preterm AB TAB SAB Ectopic Multiple Living   3 2 2  1   1  2      Review of Systems  Constitutional: Negative for chills and fatigue.  HENT: Positive for ear pain and sore throat.   Eyes: Negative.   Respiratory: Positive for cough.   Cardiovascular: Positive for chest pain.       States her chest hurts when she coughs  Genitourinary: Negative.   Musculoskeletal: Positive for myalgias.  Skin: Negative.     Allergies  Desvenlafaxine; Iodides; and Iohexol  Home Medications   Prior to Admission medications   Medication Sig Start Date End Date Taking? Authorizing Provider  buPROPion (WELLBUTRIN XL) 300 MG 24 hr tablet Take 1 tablet (300 mg total) by mouth daily. 01/07/15  Yes Jearld Fenton, NP  ALPRAZolam Duanne Moron) 0.25 MG tablet Take 1 tablet (0.25 mg total) by mouth 2 (two) times daily as needed for anxiety. 02/04/15   Jearld Fenton, NP  cholecalciferol (VITAMIN D) 1000 UNITS tablet Take 1,000 Units by mouth daily.    Historical Provider, MD  Linoleic Acid Conjugated 1000 MG CAPS Take 3,000 mg by mouth daily.    Historical Provider, MD  sertraline (ZOLOFT) 50 MG tablet Take 1 tablet (50 mg total) by mouth daily. 02/14/15  Jearld Fenton, NP  vitamin B-12 (CYANOCOBALAMIN) 1000 MCG tablet Take 1,000 mcg by mouth daily.    Historical Provider, MD  vitamin C (ASCORBIC ACID) 500 MG tablet Take 500 mg by mouth daily.    Historical Provider, MD   BP 122/71 mmHg  Pulse 71  Temp(Src) 98.1 F (36.7 C) (Oral)  Resp 20  SpO2 100% Physical Exam  Constitutional: She is oriented to person, place, and time. She appears well-developed and well-nourished.  HENT:  Head: Normocephalic and atraumatic.  Right Ear: Hearing, tympanic membrane, external ear and ear canal normal.  Left Ear: Hearing, tympanic membrane, external ear and ear canal normal.  Nose: Nose normal.  Mouth/Throat: Uvula is midline, oropharynx is clear and moist and  mucous membranes are normal.  Eyes: Conjunctivae are normal. No scleral icterus.  Cardiovascular: Normal rate, regular rhythm and normal heart sounds.   Pulmonary/Chest: Effort normal. She has no decreased breath sounds. She has no wheezes. She has no rhonchi.    Outlined region is where pleural rub can be heard  Musculoskeletal: Normal range of motion.  Neurological: She is alert and oriented to person, place, and time.  Skin: Skin is warm and dry.  Psychiatric: She has a normal mood and affect. Her behavior is normal.  Nursing note and vitals reviewed.   ED Course  Procedures (including critical care time) Labs Review Labs Reviewed  POCT RAPID STREP A ([facility] CARE ONLY)    Imaging Review No results found.   MDM   1. URI (upper respiratory infection)    ECG :NSR at 76 bpm with normal intervals. No ectopy. No dynamic ST/T wave changes. Normal intervals. No change from tracing from 01/08/2006.  EKG was normal Rapid strep test was negative. Throat swab will be held for a three day culture and if results indicate the need for additional treatment, you will be notified by phone. I suspect you are suffering from the beginning of a common cold/upper respiratory illness. Drink plenty of fluids, tylenol or ibuprofen as directed on packaging for aches, pain, fever Salt water gargles. Expect improvement over the next 7-10 days If no improvement, please follow up with your doctor.  If symptoms suddenly worse or severe, please have yourself re-evaluated at your nearest emergency room.     Lutricia Feil, [place] 03/03/15 1129  03/14/2015 (Addendum): I personally reviewed ECG on DOS 03/03/2015 and agree with computerized interpretation  Lutricia Feil, PA 03/14/15 1247

## 2015-03-03 NOTE — Discharge Instructions (Signed)
EKG was normal Rapid strep test was negative. Throat swab will be held for a three day culture and if results indicate the need for additional treatment, you will be notified by phone. I suspect you are suffering from the beginning of a common cold/upper respiratory illness. Drink plenty of fluids, tylenol or ibuprofen as directed on packaging for aches, pain, fever Salt water gargles. Expect improvement over the next 7-10 days If no improvement, please follow up with your doctor.  If symptoms suddenly worse or severe, please have yourself re-evaluated at your nearest emergency room.   Upper Respiratory Infection, Adult An upper respiratory infection (URI) is also sometimes known as the common cold. The upper respiratory tract includes the nose, sinuses, throat, trachea, and bronchi. Bronchi are the airways leading to the lungs. Most people improve within 1 week, but symptoms can last up to 2 weeks. A residual cough may last even longer.  CAUSES Many different viruses can infect the tissues lining the upper respiratory tract. The tissues become irritated and inflamed and often become very moist. Mucus production is also common. A cold is contagious. You can easily spread the virus to others by oral contact. This includes kissing, sharing a glass, coughing, or sneezing. Touching your mouth or nose and then touching a surface, which is then touched by another person, can also spread the virus. SYMPTOMS  Symptoms typically develop 1 to 3 days after you come in contact with a cold virus. Symptoms vary from person to person. They may include:  Runny nose.  Sneezing.  Nasal congestion.  Sinus irritation.  Sore throat.  Loss of voice (laryngitis).  Cough.  Fatigue.  Muscle aches.  Loss of appetite.  Headache.  Low-grade fever. DIAGNOSIS  You might diagnose your own cold based on familiar symptoms, since most people get a cold 2 to 3 times a year. Your caregiver can confirm this based  on your exam. Most importantly, your caregiver can check that your symptoms are not due to another disease such as strep throat, sinusitis, pneumonia, asthma, or epiglottitis. Blood tests, throat tests, and X-rays are not necessary to diagnose a common cold, but they may sometimes be helpful in excluding other more serious diseases. Your caregiver will decide if any further tests are required. RISKS AND COMPLICATIONS  You may be at risk for a more severe case of the common cold if you smoke cigarettes, have chronic heart disease (such as heart failure) or lung disease (such as asthma), or if you have a weakened immune system. The very young and very old are also at risk for more serious infections. Bacterial sinusitis, middle ear infections, and bacterial pneumonia can complicate the common cold. The common cold can worsen asthma and chronic obstructive pulmonary disease (COPD). Sometimes, these complications can require emergency medical care and may be life-threatening. PREVENTION  The best way to protect against getting a cold is to practice good hygiene. Avoid oral or hand contact with people with cold symptoms. Wash your hands often if contact occurs. There is no clear evidence that vitamin C, vitamin E, echinacea, or exercise reduces the chance of developing a cold. However, it is always recommended to get plenty of rest and practice good nutrition. TREATMENT  Treatment is directed at relieving symptoms. There is no cure. Antibiotics are not effective, because the infection is caused by a virus, not by bacteria. Treatment may include:  Increased fluid intake. Sports drinks offer valuable electrolytes, sugars, and fluids.  Breathing heated mist or steam (vaporizer  or shower).  Eating chicken soup or other clear broths, and maintaining good nutrition.  Getting plenty of rest.  Using gargles or lozenges for comfort.  Controlling fevers with ibuprofen or acetaminophen as directed by your  caregiver.  Increasing usage of your inhaler if you have asthma. Zinc gel and zinc lozenges, taken in the first 24 hours of the common cold, can shorten the duration and lessen the severity of symptoms. Pain medicines may help with fever, muscle aches, and throat pain. A variety of non-prescription medicines are available to treat congestion and runny nose. Your caregiver can make recommendations and may suggest nasal or lung inhalers for other symptoms.  HOME CARE INSTRUCTIONS   Only take over-the-counter or prescription medicines for pain, discomfort, or fever as directed by your caregiver.  Use a warm mist humidifier or inhale steam from a shower to increase air moisture. This may keep secretions moist and make it easier to breathe.  Drink enough water and fluids to keep your urine clear or pale yellow.  Rest as needed.  Return to work when your temperature has returned to normal or as your caregiver advises. You may need to stay home longer to avoid infecting others. You can also use a face mask and careful hand washing to prevent spread of the virus. SEEK MEDICAL CARE IF:   After the first few days, you feel you are getting worse rather than better.  You need your caregiver's advice about medicines to control symptoms.  You develop chills, worsening shortness of breath, or brown or red sputum. These may be signs of pneumonia.  You develop yellow or brown nasal discharge or pain in the face, especially when you bend forward. These may be signs of sinusitis.  You develop a fever, swollen neck glands, pain with swallowing, or white areas in the back of your throat. These may be signs of strep throat. SEEK IMMEDIATE MEDICAL CARE IF:   You have a fever.  You develop severe or persistent headache, ear pain, sinus pain, or chest pain.  You develop wheezing, a prolonged cough, cough up blood, or have a change in your usual mucus (if you have chronic lung disease).  You develop sore  muscles or a stiff neck. Document Released: 06/05/2001 Document Revised: 03/03/2012 Document Reviewed: 03/17/2014 [facility] Patient Information 2015 [place], [place]. This information is not intended to replace advice given to you by your health care provider. Make sure you discuss any questions you have with your health care provider.

## 2015-03-05 LAB — CULTURE, GROUP A STREP: Strep A Culture: NEGATIVE

## 2015-03-07 ENCOUNTER — Ambulatory Visit: Payer: BC Managed Care – PPO | Admitting: Family Medicine

## 2015-03-09 ENCOUNTER — Other Ambulatory Visit: Payer: Self-pay | Admitting: Internal Medicine

## 2015-03-09 ENCOUNTER — Other Ambulatory Visit: Payer: Self-pay

## 2015-03-09 DIAGNOSIS — F411 Generalized anxiety disorder: Secondary | ICD-10-CM

## 2015-03-09 MED ORDER — ALPRAZOLAM 0.5 MG PO TABS: 0.5000 mg | ORAL_TABLET | Freq: Every evening | ORAL | Status: DC | PRN

## 2015-03-09 MED ORDER — FLUOXETINE HCL 20 MG PO TABS: 20.0000 mg | ORAL_TABLET | Freq: Every day | ORAL | Status: DC

## 2015-03-09 MED ORDER — ALPRAZOLAM 0.5 MG PO TABS: 0.5000 mg | ORAL_TABLET | Freq: Two times a day (BID) | ORAL | Status: DC | PRN

## 2015-03-09 NOTE — Telephone Encounter (Signed)
Rx called in to pharmacy. 

## 2015-03-31 ENCOUNTER — Ambulatory Visit: Payer: Self-pay | Admitting: Psychology

## 2015-04-01 ENCOUNTER — Ambulatory Visit: Payer: Self-pay | Admitting: Internal Medicine

## 2015-04-08 ENCOUNTER — Ambulatory Visit (INDEPENDENT_AMBULATORY_CARE_PROVIDER_SITE_OTHER): Payer: BLUE CROSS/BLUE SHIELD | Admitting: Internal Medicine

## 2015-04-08 ENCOUNTER — Encounter: Payer: Self-pay | Admitting: Internal Medicine

## 2015-04-08 VITALS — BP 120/80 | HR 76 | Temp 98.7°F | Wt 192.0 lb

## 2015-04-08 DIAGNOSIS — F329 Major depressive disorder, single episode, unspecified: Secondary | ICD-10-CM | POA: Diagnosis not present

## 2015-04-08 DIAGNOSIS — F418 Other specified anxiety disorders: Secondary | ICD-10-CM | POA: Diagnosis not present

## 2015-04-08 DIAGNOSIS — F32A Depression, unspecified: Secondary | ICD-10-CM

## 2015-04-08 DIAGNOSIS — F419 Anxiety disorder, unspecified: Principal | ICD-10-CM

## 2015-04-08 LAB — VITAMIN B12: Vitamin B-12: 467 pg/mL (ref 211–911)

## 2015-04-08 MED ORDER — BUPROPION HCL ER (XL) 150 MG PO TB24: 150.0000 mg | ORAL_TABLET | Freq: Every day | ORAL | Status: DC

## 2015-04-08 NOTE — Progress Notes (Signed)
Pre visit review using our clinic review tool, if applicable. No additional management support is needed unless otherwise documented below in the visit note. 

## 2015-04-09 LAB — VITAMIN D 25 HYDROXY (VIT D DEFICIENCY, FRACTURES): Vit D, 25-Hydroxy: 33 ng/mL (ref 30–100)

## 2015-04-09 NOTE — Progress Notes (Signed)
Subjective:    Patient ID: [name], female    DOB: 08/07/1966, 49 y.o.   MRN: [id number]  HPI  Pt presents to the clinic today to discuss her anxiety/depression medication. She takes Wellbutrin in the morning. It was upped 11/2014 to 300 mg. We started her on Prozac 02/14/15. She reports she feels improvement in her mood swings and feels less anxious. She also reports it makes her feel space out usually in the early afternoon. She has been feeling this way for about 2 weeks. She reports that she did stop going to the gym during that time but she has not changed any other medications or changes in her diet. She does not feel stressed out.  Review of Systems      Past Medical History  Diagnosis Date  . Anxiety   . Abnormal uterine bleeding 2008    reason for hysterectomy  . Abnormal Pap smear of cervix 1991 - 1995  . Chicken pox   . Depression   . Hx of colonic polyps   . Bladder prolapse, female, acquired     Current Outpatient Prescriptions  Medication Sig Dispense Refill  . ALPRAZolam (XANAX) 0.5 MG tablet Take 1 tablet (0.5 mg total) by mouth 2 (two) times daily as needed for anxiety. 60 tablet 0  . FLUoxetine (PROZAC) 20 MG tablet Take 1 tablet (20 mg total) by mouth daily. 30 tablet 3  . buPROPion (WELLBUTRIN XL) 150 MG 24 hr tablet Take 1 tablet (150 mg total) by mouth daily. 30 tablet 2  . cholecalciferol (VITAMIN D) 1000 UNITS tablet Take 1,000 Units by mouth daily.    . Linoleic Acid Conjugated 1000 MG CAPS Take 3,000 mg by mouth daily.    . vitamin B-12 (CYANOCOBALAMIN) 1000 MCG tablet Take 1,000 mcg by mouth daily.    . vitamin C (ASCORBIC ACID) 500 MG tablet Take 500 mg by mouth daily.     No current facility-administered medications for this visit.    Allergies  Allergen Reactions  . Desvenlafaxine     REACTION: withdrawl  . Iodides Itching  . Iohexol      Code: RASH, Desc: PT STATES BACK IN THE 70S SHE HAD IV DYE REACTION.04/30/07/RM---pt given omnipaque  300 w/out pre meds; no complications 2/97/98 slg, Onset Date: [id number]     Family History  Problem Relation Age of Onset  . Hypertension Mother   . Diabetes Father   . Cancer Paternal Uncle     Pancreas  . Heart attack Maternal Grandmother   . Drug abuse Sister     History   Social History  . Marital Status: Married    Spouse Name: N/A  . Number of Children: N/A  . Years of Education: N/A   Occupational History  . Not on file.   Social History Main Topics  . Smoking status: Former Research scientist (life sciences)  . Smokeless tobacco: Never Used  . Alcohol Use: No  . Drug Use: No  . Sexual Activity:    Partners: Male    Birth Control/ Protection: Surgical     Comment: Hyst   Other Topics Concern  . Not on file   Social History Narrative     Constitutional: Denies fever, malaise, fatigue, headache or abrupt weight changes.  Respiratory: Denies difficulty breathing, shortness of breath, cough or sputum production.   Cardiovascular: Denies chest pain, chest tightness, palpitations or swelling in the hands or feet.  Neurological: Denies dizziness, difficulty with memory, difficulty with speech or problems with  balance and coordination.  Psych: Pt reports anxiety, depression. Denies SI/HI.  No other specific complaints in a complete review of systems (except as listed in HPI above).  Objective:   Physical Exam   BP 120/80 mmHg  Pulse 76  Temp(Src) 98.7 F (37.1 C) (Oral)  Wt 192 lb (87.091 kg)  SpO2 99% Wt Readings from Last 3 Encounters:  04/08/15 192 lb (87.091 kg)  02/14/15 191 lb (86.637 kg)  01/24/15 187 lb 3.2 oz (84.913 kg)    General: Appears her stated age, well developed, well nourished in NAD. Skin: Warm, dry and intact.  Cardiovascular: Normal rate and rhythm. S1,S2 noted.  No murmur, rubs or gallops noted.  Pulmonary/Chest: Normal effort and positive vesicular breath sounds. No Neurological: Alert and oriented.  Psychiatric: Mood and affect normal. Behavior is  normal. Judgment and thought content normal.     BMET    Component Value Date/Time   NA 139 12/23/2014 1033   K 4.7 12/23/2014 1033   CL 106 12/23/2014 1033   CO2 29 12/23/2014 1033   GLUCOSE 98 12/23/2014 1033   BUN 19 12/23/2014 1033   CREATININE 0.9 12/23/2014 1033   CALCIUM 9.8 12/23/2014 1033   GFRNONAA >90 09/09/2014 2223   GFRAA >90 09/09/2014 2223    Lipid Panel     Component Value Date/Time   CHOL 196 12/23/2014 1033   TRIG 85.0 12/23/2014 1033   HDL 52.30 12/23/2014 1033   CHOLHDL 4 12/23/2014 1033   VLDL 17.0 12/23/2014 1033   LDLCALC 127* 12/23/2014 1033    CBC    Component Value Date/Time   WBC 5.3 12/23/2014 1033   RBC 4.47 12/23/2014 1033   HGB 12.9 12/23/2014 1033   HCT 38.4 12/23/2014 1033   PLT 249.0 12/23/2014 1033   MCV 85.9 12/23/2014 1033   MCH 28.7 09/09/2014 2223   MCHC 33.5 12/23/2014 1033   RDW 13.6 12/23/2014 1033   LYMPHSABS 2.8 09/09/2014 2223   MONOABS 0.6 09/09/2014 2223   EOSABS 0.1 09/09/2014 2223   BASOSABS 0.0 09/09/2014 2223    Hgb A1C No results found for: HGBA1C      Assessment & Plan:   Anxiety and Depression:  Will decrease the Wellbutrin to 150 mg daily Continue Prozac at current dose Also advised her to take her Wellbutrin and Prozac daily at night Support offered today  RTC in 1 month or sooner if needed

## 2015-04-09 NOTE — Patient Instructions (Signed)
Adjustment Disorder °Most changes in life can cause stress. Getting used to changes may take a few months or longer. If feelings of stress, hopelessness, or worry continue, you may have an adjustment disorder. This stress-related mental health problem may affect your feelings, thinking and how you act. It occurs in both sexes and happens at any age. °SYMPTOMS  °Some of the following problems may be seen and vary from person to person: °· Sadness or depression. °· Loss of enjoyment. °· Thoughts of suicide. °· Fighting. °· Avoiding family and friends. °· Poor school performance. °· Hopelessness, sense of loss. °· Trouble sleeping. °· Vandalism. °· Worry, weight loss or gain. °· Crying spells. °· Anxiety °· Reckless driving. °· Skipping school. °· Poor work performance. °· Nervousness. °· Ignoring bills. °· Poor attitude. °DIAGNOSIS  °Your caregiver will ask what has happened in your life and do a physical exam. They will make a diagnosis of an adjustment disorder when they are sure another problem or medical illness causing your feelings does not exist. °TREATMENT  °When problems caused by stress interfere with you daily life or last longer than a few months, you may need counseling for an adjustment disorder. Early treatment may diminish problems and help you to better cope with the stressful events in your life. Sometimes medication is necessary. Individual counseling and or support groups can be very helpful. °PROGNOSIS  °Adjustment disorders usually last less than 3 to 6 months. The condition may persist if there is long lasting stress. This could include health problems, relationship problems, or job difficulties where you can not easily escape from what is causing the problem. °PREVENTION  °Even the most mentally healthy, highly functioning people can suffer from an adjustment disorder given a significant blow from a life-changing event. There is no way to prevent pain and loss. Most people need help from time  to time. You are not alone. °SEEK MEDICAL CARE IF:  °Your feelings or symptoms listed above do not improve or worsen. °Document Released: 08/14/2006 Document Revised: 03/03/2012 Document Reviewed: 11/05/2007 °[employer]® Patient Information ©2015 [employer]. This information is not intended to replace advice given to you by your health care provider. Make sure you discuss any questions you have with your health care provider. ° °

## 2015-04-11 ENCOUNTER — Encounter (HOSPITAL_COMMUNITY): Payer: Self-pay | Admitting: Emergency Medicine

## 2015-04-11 ENCOUNTER — Emergency Department (HOSPITAL_COMMUNITY)
Admission: EM | Admit: 2015-04-11 | Discharge: 2015-04-11 | Disposition: A | Discharge status: Home / Self Care | Payer: BLUE CROSS/BLUE SHIELD | Attending: Emergency Medicine | Admitting: Emergency Medicine

## 2015-04-11 DIAGNOSIS — Z87891 Personal history of nicotine dependence: Secondary | ICD-10-CM | POA: Diagnosis not present

## 2015-04-11 DIAGNOSIS — Z8619 Personal history of other infectious and parasitic diseases: Secondary | ICD-10-CM | POA: Insufficient documentation

## 2015-04-11 DIAGNOSIS — F419 Anxiety disorder, unspecified: Secondary | ICD-10-CM | POA: Insufficient documentation

## 2015-04-11 DIAGNOSIS — Z86010 Personal history of colonic polyps: Secondary | ICD-10-CM | POA: Insufficient documentation

## 2015-04-11 DIAGNOSIS — F329 Major depressive disorder, single episode, unspecified: Secondary | ICD-10-CM | POA: Diagnosis not present

## 2015-04-11 DIAGNOSIS — Z9049 Acquired absence of other specified parts of digestive tract: Secondary | ICD-10-CM | POA: Insufficient documentation

## 2015-04-11 DIAGNOSIS — Z79899 Other long term (current) drug therapy: Secondary | ICD-10-CM | POA: Diagnosis not present

## 2015-04-11 DIAGNOSIS — Z90710 Acquired absence of both cervix and uterus: Secondary | ICD-10-CM | POA: Diagnosis not present

## 2015-04-11 DIAGNOSIS — R1013 Epigastric pain: Secondary | ICD-10-CM | POA: Diagnosis present

## 2015-04-11 DIAGNOSIS — Z8742 Personal history of other diseases of the female genital tract: Secondary | ICD-10-CM | POA: Diagnosis not present

## 2015-04-11 DIAGNOSIS — R1084 Generalized abdominal pain: Secondary | ICD-10-CM

## 2015-04-11 LAB — LIPASE, BLOOD: Lipase: 31 U/L (ref 11–59)

## 2015-04-11 LAB — COMPREHENSIVE METABOLIC PANEL
ALT: 14 U/L (ref 0–35)
AST: 19 U/L (ref 0–37)
Albumin: 4 g/dL (ref 3.5–5.2)
Alkaline Phosphatase: 80 U/L (ref 39–117)
Anion gap: 4 — ABNORMAL LOW (ref 5–15)
BUN: 17 mg/dL (ref 6–23)
CO2: 26 mmol/L (ref 19–32)
Calcium: 8.9 mg/dL (ref 8.4–10.5)
Chloride: 107 mmol/L (ref 96–112)
Creatinine, Ser: 0.82 mg/dL (ref 0.50–1.10)
GFR calc Af Amer: >90 mL/min (ref >90)
GFR calc non Af Amer: 83 mL/min — ABNORMAL LOW (ref >90)
Glucose, Bld: 105 mg/dL — ABNORMAL HIGH (ref 70–99)
Potassium: 3.7 mmol/L (ref 3.5–5.1)
Sodium: 137 mmol/L (ref 135–145)
Total Bilirubin: 0.5 mg/dL (ref 0.3–1.2)
Total Protein: 7.5 g/dL (ref 6.0–8.3)

## 2015-04-11 LAB — CBC WITH DIFFERENTIAL/PLATELET
Basophils Absolute: 0 10*3/uL (ref 0.0–0.1)
Basophils Relative: 0 % (ref 0–1)
Eosinophils Absolute: 0.1 10*3/uL (ref 0.0–0.7)
Eosinophils Relative: 1 % (ref 0–5)
HCT: 40.3 % (ref 36.0–46.0)
Hemoglobin: 13.4 g/dL (ref 12.0–15.0)
Lymphocytes Relative: 38 % (ref 12–46)
Lymphs Abs: 1.9 10*3/uL (ref 0.7–4.0)
MCH: 28.8 pg (ref 26.0–34.0)
MCHC: 33.3 g/dL (ref 30.0–36.0)
MCV: 86.7 fL (ref 78.0–100.0)
Monocytes Absolute: 0.4 10*3/uL (ref 0.1–1.0)
Monocytes Relative: 9 % (ref 3–12)
Neutro Abs: 2.6 10*3/uL (ref 1.7–7.7)
Neutrophils Relative %: 52 % (ref 43–77)
Platelets: 205 10*3/uL (ref 150–400)
RBC: 4.65 MIL/uL (ref 3.87–5.11)
RDW: 13 % (ref 11.5–15.5)
WBC: 4.9 10*3/uL (ref 4.0–10.5)

## 2015-04-11 LAB — URINALYSIS, ROUTINE W REFLEX MICROSCOPIC
Bilirubin Urine: NEGATIVE
Glucose, UA: NEGATIVE mg/dL
Ketones, ur: NEGATIVE mg/dL
Leukocytes, UA: NEGATIVE
Nitrite: NEGATIVE
Protein, ur: NEGATIVE mg/dL
Specific Gravity, Urine: 1.013 (ref 1.005–1.030)
Urobilinogen, UA: 0.2 mg/dL (ref 0.0–1.0)
pH: 6.5 (ref 5.0–8.0)

## 2015-04-11 LAB — URINE MICROSCOPIC-ADD ON

## 2015-04-11 MED ORDER — ONDANSETRON HCL 4 MG/2ML IJ SOLN
4.0000 mg | Freq: Once | INTRAMUSCULAR | Status: AC
  Administered 2015-04-11: 4 mg via INTRAVENOUS
  Filled 2015-04-11: qty 2

## 2015-04-11 MED ORDER — SODIUM CHLORIDE 0.9 % IV BOLUS (SEPSIS)
1000.0000 mL | Freq: Once | INTRAVENOUS | Status: AC
  Administered 2015-04-11: 1000 mL via INTRAVENOUS

## 2015-04-11 MED ORDER — HYDROCODONE-ACETAMINOPHEN 5-325 MG PO TABS: 1.0000 | ORAL_TABLET | Freq: Four times a day (QID) | ORAL | Status: DC | PRN

## 2015-04-11 MED ORDER — SUCRALFATE 1 G PO TABS: 1.0000 g | ORAL_TABLET | Freq: Four times a day (QID) | ORAL | Status: DC

## 2015-04-11 MED ORDER — SUCRALFATE 1 G PO TABS
1.0000 g | ORAL_TABLET | Freq: Once | ORAL | Status: AC
  Administered 2015-04-11: 1 g via ORAL
  Filled 2015-04-11: qty 1

## 2015-04-11 MED ORDER — FAMOTIDINE 20 MG PO TABS: 20.0000 mg | ORAL_TABLET | Freq: Two times a day (BID) | ORAL | Status: DC

## 2015-04-11 MED ORDER — KETOROLAC TROMETHAMINE 30 MG/ML IJ SOLN
30.0000 mg | Freq: Once | INTRAMUSCULAR | Status: AC
  Administered 2015-04-11: 30 mg via INTRAMUSCULAR
  Filled 2015-04-11: qty 1

## 2015-04-11 MED ORDER — GI COCKTAIL ~~LOC~~
30.0000 mL | Freq: Once | ORAL | Status: AC
  Administered 2015-04-11: 30 mL via ORAL
  Filled 2015-04-11: qty 30

## 2015-04-11 MED ORDER — FAMOTIDINE IN NACL 20-0.9 MG/50ML-% IV SOLN
20.0000 mg | Freq: Once | INTRAVENOUS | Status: AC
  Administered 2015-04-11: 20 mg via INTRAVENOUS
  Filled 2015-04-11: qty 50

## 2015-04-11 MED ORDER — MORPHINE SULFATE 4 MG/ML IJ SOLN
4.0000 mg | Freq: Once | INTRAMUSCULAR | Status: AC
  Administered 2015-04-11: 4 mg via INTRAVENOUS
  Filled 2015-04-11: qty 1

## 2015-04-11 MED ORDER — HYDROMORPHONE HCL 1 MG/ML IJ SOLN
1.0000 mg | Freq: Once | INTRAMUSCULAR | Status: AC
  Administered 2015-04-11: 1 mg via INTRAVENOUS
  Filled 2015-04-11: qty 1

## 2015-04-11 NOTE — ED Notes (Signed)
MD at bedside. 

## 2015-04-11 NOTE — ED Notes (Signed)
Pt c/o abd pain with nausea that intermittent per pt.  Pt states that started Saturday night.  Pt states that she had little diarrhea yesterday.  Pt states that stomach hurts worse after she eats but has had her gallbladder removed.

## 2015-04-11 NOTE — ED Provider Notes (Signed)
CSN: [id number]     Arrival date & time 04/11/15  3559 History   First MD Initiated Contact with Patient 04/11/15 [phone]     Chief Complaint  Patient presents with  . Abdominal Pain     (Consider location/radiation/quality/duration/timing/severity/associated sxs/prior Treatment) HPI Patient presents with concern of epigastric abdominal pain, nausea, anorexia. Symptoms began about 48 hours ago.  Since onset symptoms have been progressively more severe, though the severity is waxing/waning. precipitant. Since onset symptoms seem worse with oral intake. Patient has had several episodes of loose stool, but no vomiting. No ongoing fever, chills, other chest pain, dyspnea, syncope. Pain is similar to that the patient experienced prior to cholecystectomy.  Past Medical History  Diagnosis Date  . Anxiety   . Abnormal uterine bleeding 2008    reason for hysterectomy  . Abnormal Pap smear of cervix 1991 - 1995  . Chicken pox   . Depression   . Hx of colonic polyps   . Bladder prolapse, female, acquired    Past Surgical History  Procedure Laterality Date  . Shoulder surgery    . Abdominal hysterectomy  08/2007    complete  . Ectopic pregnancy surgery    . Cholecystectomy    . Hernia repair    . Cervix lesion destruction  1991- 1995   Family History  Problem Relation Age of Onset  . Hypertension Mother   . Diabetes Father   . Cancer Paternal Uncle     Pancreas  . Heart attack Maternal Grandmother   . Drug abuse Sister    History  Substance Use Topics  . Smoking status: Former Research scientist (life sciences)  . Smokeless tobacco: Never Used  . Alcohol Use: No   OB History    Gravida Para Term Preterm AB TAB SAB Ectopic Multiple Living   3 2 2  1   1  2      Review of Systems  Constitutional:       Per HPI, otherwise negative  HENT:       Per HPI, otherwise negative  Respiratory:       Per HPI, otherwise negative  Cardiovascular:       Per HPI, otherwise negative  Gastrointestinal:  Negative for vomiting.  Endocrine:       Negative aside from HPI  Genitourinary:       Neg aside from HPI   Musculoskeletal:       Per HPI, otherwise negative  Skin: Negative.   Neurological: Negative for syncope.      Allergies  Desvenlafaxine; Iodides; and Iohexol  Home Medications   Prior to Admission medications   Medication Sig Start Date End Date Taking? Authorizing Provider  ALPRAZolam Duanne Moron) 0.5 MG tablet Take 1 tablet (0.5 mg total) by mouth 2 (two) times daily as needed for anxiety. 03/09/15   Jearld Fenton, NP  buPROPion (WELLBUTRIN XL) 150 MG 24 hr tablet Take 1 tablet (150 mg total) by mouth daily. 04/08/15   Jearld Fenton, NP  cholecalciferol (VITAMIN D) 1000 UNITS tablet Take 1,000 Units by mouth daily.    Historical Provider, MD  FLUoxetine (PROZAC) 20 MG tablet Take 1 tablet (20 mg total) by mouth daily. 03/09/15   Jearld Fenton, NP  Linoleic Acid Conjugated 1000 MG CAPS Take 3,000 mg by mouth daily.    Historical Provider, MD  vitamin B-12 (CYANOCOBALAMIN) 1000 MCG tablet Take 1,000 mcg by mouth daily.    Historical Provider, MD  vitamin C (ASCORBIC ACID) 500 MG tablet Take  500 mg by mouth daily.    Historical Provider, MD   BP 149/87 mmHg  Pulse 83  Temp(Src) 98.3 F (36.8 C) (Oral)  Resp 19  SpO2 100% Physical Exam  Constitutional: She is oriented to person, place, and time. She appears well-developed and well-nourished. No distress.  HENT:  Head: Normocephalic and atraumatic.  Eyes: Conjunctivae and EOM are normal.  Cardiovascular: Normal rate and regular rhythm.   Pulmonary/Chest: Effort normal and breath sounds normal. No stridor. No respiratory distress.  Abdominal: She exhibits no distension. There is tenderness in the epigastric area. There is no rigidity and no guarding.  Musculoskeletal: She exhibits no edema.  Neurological: She is alert and oriented to person, place, and time. No cranial nerve deficit.  Skin: Skin is warm and dry.   Psychiatric: She has a normal mood and affect.  Nursing note and vitals reviewed.   ED Course  Procedures (including critical care time) Labs Review Labs Reviewed  COMPREHENSIVE METABOLIC PANEL - Abnormal; Notable for the following:    Glucose, Bld 105 (*)    GFR calc non Af Amer 83 (*)    Anion gap 4 (*)    All other components within normal limits  URINALYSIS, ROUTINE W REFLEX MICROSCOPIC - Abnormal; Notable for the following:    Hgb urine dipstick TRACE (*)    All other components within normal limits  CBC WITH DIFFERENTIAL/PLATELET  LIPASE, BLOOD  URINE MICROSCOPIC-ADD ON    Update: Patient continues to have epigastric pain. Reviewed the patient's chart chemistries 5 CT scans within the past 6 years for similar concerns.  Following additional fluids, different pain medication patient continues to complain of epigastric pain.   3:19 PM Pain is finally resolved, patient states that she feels better, she appears more calm. We discussed all findings, including the need follow-up with gastroenterology, and the initiation of medication for her likely gastritis versus esophagitis. MDM   Patient presents with abdominal pain. Here the patient is awake, alert, hemodynamically stable, soft, non-peritoneal abdomen. Given the patient's description of similar pain to prior biliary dysfunction, labs, urinalysis performed. No evidence for hepato-biliary dysfunction, pancreatic dysfunction. After several different attempts at analgesia, the patient had resolution of her symptoms. Patient discharged in stable condition with initiation of medication to help a gastroenterology.    Carmin Muskrat, MD 04/11/15 [id number]

## 2015-04-13 ENCOUNTER — Telehealth: Payer: Self-pay | Admitting: Gastroenterology

## 2015-04-13 ENCOUNTER — Encounter: Payer: Self-pay | Admitting: Internal Medicine

## 2015-04-13 NOTE — Telephone Encounter (Signed)
Spoke with patient and scheduled OV with Nicoletta Ba, PA on 04/18/15 at 2:30 PM.

## 2015-04-14 ENCOUNTER — Telehealth: Payer: Self-pay | Admitting: Internal Medicine

## 2015-04-14 ENCOUNTER — Ambulatory Visit: Payer: BLUE CROSS/BLUE SHIELD | Admitting: Psychology

## 2015-04-14 ENCOUNTER — Ambulatory Visit: Payer: BLUE CROSS/BLUE SHIELD | Admitting: Internal Medicine

## 2015-04-14 DIAGNOSIS — Z0289 Encounter for other administrative examinations: Secondary | ICD-10-CM

## 2015-04-14 NOTE — Telephone Encounter (Signed)
Patient did not come in for their appointment today for ED follow up.  Please let me know if patient needs to be contacted immediately for follow up or no follow up needed.

## 2015-04-14 NOTE — Telephone Encounter (Signed)
She did come in, just late. She can either follow up with me or she has an appt with GI Monday 4/25.

## 2015-04-18 ENCOUNTER — Encounter: Payer: Self-pay | Admitting: Physician Assistant

## 2015-04-18 ENCOUNTER — Ambulatory Visit (INDEPENDENT_AMBULATORY_CARE_PROVIDER_SITE_OTHER): Payer: BLUE CROSS/BLUE SHIELD | Admitting: Physician Assistant

## 2015-04-18 VITALS — BP 118/66 | HR 84 | Ht 66.5 in | Wt 195.0 lb

## 2015-04-18 DIAGNOSIS — R1013 Epigastric pain: Secondary | ICD-10-CM | POA: Diagnosis not present

## 2015-04-18 DIAGNOSIS — R110 Nausea: Secondary | ICD-10-CM

## 2015-04-18 MED ORDER — PANTOPRAZOLE SODIUM 40 MG PO TBEC: 40.0000 mg | DELAYED_RELEASE_TABLET | Freq: Two times a day (BID) | ORAL | Status: DC

## 2015-04-18 MED ORDER — GLYCOPYRROLATE 2 MG PO TABS: 2.0000 mg | ORAL_TABLET | Freq: Two times a day (BID) | ORAL | Status: DC

## 2015-04-18 NOTE — Progress Notes (Signed)
Patient ID: [name], female   DOB: 1966-05-24, 49 y.o.   MRN: [id number]   Subjective:    Patient ID: [name], female    DOB: 22-Dec-1966, 49 y.o.   MRN: [id number]  HPI Braydee is a pleasant 49 year old female known to Dr. Ardis Hughs from colonoscopy done in 2008. She had a few tiny hyperplastic polyps removed at that time and was noted to have internal hemorrhoids and an otherwise negative exam. EGD done at that same time showed some spasm in the duodenum but otherwise negative exam and biopsies were also negative. Patient comes in today after recent ER visit on 04/11/2015 with acute epigastric pain and nausea. She says she had onset of her symptoms 2 days prior to the ER visit with nausea and then sharp epigastric pains which persisted. She said by the morning she went to the ER her pain had progressed and she was having ongoing nausea without vomiting no fever or chills. She had a couple episodes of loose stools and that resolved. No imaging done in the emergency room but CBC see met and lipase were unremarkable. She was given an H2 blocker and Carafate which she took for a few days but did not feel helped. Now 1 week post-ER visit she continues to have which she describes as sharp epigastric pain which is not as bad as it had been in some intermittent nausea. She's not been on any regular aspirin or NSAIDs denies any regular EtOH use. She has been eating bland. Patient has history of nonobstructive left renal calculi she status post remote cholecystectomy and has chronic anxiety and depression.  Review of Systems Pertinent positive and negative review of systems were noted in the above HPI section.  All other review of systems was otherwise negative.  Outpatient Encounter Prescriptions as of 04/18/2015  Medication Sig  . buPROPion (WELLBUTRIN XL) 150 MG 24 hr tablet Take 1 tablet (150 mg total) by mouth daily.  Marland Kitchen FLUoxetine (PROZAC) 20 MG tablet Take 1 tablet (20 mg total) by mouth daily.    Marland Kitchen glycopyrrolate (ROBINUL) 2 MG tablet Take 1 tablet (2 mg total) by mouth 2 (two) times daily.  . pantoprazole (PROTONIX) 40 MG tablet Take 1 tablet (40 mg total) by mouth 2 (two) times daily.  . [DISCONTINUED] ALPRAZolam (XANAX) 0.5 MG tablet Take 1 tablet (0.5 mg total) by mouth 2 (two) times daily as needed for anxiety.  . [DISCONTINUED] famotidine (PEPCID) 20 MG tablet Take 1 tablet (20 mg total) by mouth 2 (two) times daily.  . [DISCONTINUED] HYDROcodone-acetaminophen (NORCO/VICODIN) 5-325 MG per tablet Take 1 tablet by mouth every 6 (six) hours as needed for severe pain.  . [DISCONTINUED] sucralfate (CARAFATE) 1 G tablet Take 1 tablet (1 g total) by mouth 4 (four) times daily.   Allergies  Allergen Reactions  . Desvenlafaxine Other (See Comments)    REACTION: withdrawl  . Iodides Itching  . Iohexol      Code: RASH, Desc: PT STATES BACK IN THE 70S SHE HAD IV DYE REACTION.04/30/07/RM---pt given omnipaque 300 w/out pre meds; no complications 0/93/26 slg, Onset Date: [id number]    Patient Active Problem List   Diagnosis Date Noted  . Adjustment disorder with anxious mood 11/01/2014  . DYSTHYMIC DISORDER 05/25/2009  . ACNE VULGARIS, FACIAL 05/25/2009  . SEBACEOUS CYST, INFECTED 04/26/2009  . DERMATITIS, ATOPIC 12/29/2008  . RESTLESS LEG SYNDROME 09/06/2008  . TRANSIENT DISORDER INITIATING/MAINTAINING SLEEP 07/23/2008  . COLONIC POLYPS, HYPERPLASTIC 05/15/2008  . HEMORRHOIDS, INTERNAL 05/15/2008  .  UMBILICAL HERNIA 25/63/8937  . MIGRAINE HEADACHE 02/26/2008  . NEPHROLITHIASIS, HX OF 09/29/2007   History   Social History  . Marital Status: Married    Spouse Name: N/A  . Number of Children: N/A  . Years of Education: N/A   Occupational History  . Not on file.   Social History Main Topics  . Smoking status: Former Research scientist (life sciences)  . Smokeless tobacco: Never Used  . Alcohol Use: No  . Drug Use: No  . Sexual Activity:    Partners: Male    Birth Control/ Protection: Surgical      Comment: Hyst   Other Topics Concern  . Not on file   Social History Narrative    Ms. Natter's family history includes Cancer in her paternal uncle; Diabetes in her father; Drug abuse in her sister; Heart attack in her maternal grandmother; Hypertension in her mother.      Objective:    Filed Vitals:   04/18/15 1419  BP: 118/66  Pulse: 84    Physical Exam  well-developed white female in no acute distress, pleasant blood pressure 118/66 pulse 84 height 5 foot 6 weight 195. HEENT; nontraumatic normocephalic EOMI PERRLA sclera anicteric, Supple; no JVD, Cardiovascular ;regular rate and rhythm with S1-S2 no murmur rub or gallop, Pulmonary; clear bilaterally, Abdomen ;soft is tender in the epigastrium and mildly across the upper abdomen no guarding or rebound no palpable mass or hepatosplenomegaly bowel sounds are present, cholecystectomy scars, Rectal; exam not done, Extremities; no clubbing cyanosis or edema skin warm and dry, Psych ;mood an10 day history of epigastric pain and nausead affect normal and appropriate       Assessment & Plan:   #1 49 yo female with 10 day hx of epigastric pain, nausea-Etiology not clear- r/o gastritis,PUD,Hpylori, doubt choledocholithiasis #2 hx IBS #3 hx hyperplastic colon polyps 2008 #4 s/p cholecytectomy  Plan; Check HPylori stool Ag Start Protonix 40 mg po Bid x 2 weeks Start Robinul forte 2 mg po BID Bland diet Pt will call back in a week- if sxs not improving will proceed with further workup-CTand possible EGD with Dr Herma Mering PA-C 04/18/2015   Cc: Jearld Fenton, NP

## 2015-04-18 NOTE — Progress Notes (Signed)
I agree with the above note, plan 

## 2015-04-18 NOTE — Patient Instructions (Signed)
We are sending in your prescriptions to your pharmacy today  (Protonix, Robinul Forte) Go to the basement for  Your labs today   (Stool Test) Call us next week if you are not any better

## 2015-04-21 ENCOUNTER — Encounter: Payer: Self-pay | Admitting: Physician Assistant

## 2015-04-21 ENCOUNTER — Telehealth: Payer: Self-pay | Admitting: Physician Assistant

## 2015-04-22 NOTE — Telephone Encounter (Signed)
I have left message for the patient to call back. DPR on file. Okay to leave a message on the cell phone.

## 2015-04-26 NOTE — Telephone Encounter (Signed)
Patient is not calling back. Her "MYchart" is inactive. Appointment cancelled.

## 2015-04-28 ENCOUNTER — Ambulatory Visit: Payer: Self-pay | Admitting: Physician Assistant

## 2015-05-02 ENCOUNTER — Telehealth: Payer: Self-pay | Admitting: Physician Assistant

## 2015-05-02 NOTE — Telephone Encounter (Signed)
Continue BID protonix, see if taking the Robinul forte I orderd BID-she needs to do the Hpylori stool AG, and can proceed with EGD with Dr Ardis Hughs and CT abdomen for persistent epigastric pain

## 2015-05-02 NOTE — Telephone Encounter (Signed)
Patient is taking Protonix BIS. She was feeling better but her symptoms of abdominal pain have returned. She has not done the hpylori yet. She says she will take care of that. Please advise.

## 2015-05-03 ENCOUNTER — Other Ambulatory Visit: Payer: BLUE CROSS/BLUE SHIELD

## 2015-05-03 ENCOUNTER — Other Ambulatory Visit: Payer: Self-pay

## 2015-05-03 DIAGNOSIS — R109 Unspecified abdominal pain: Secondary | ICD-10-CM

## 2015-05-03 DIAGNOSIS — R110 Nausea: Secondary | ICD-10-CM

## 2015-05-03 DIAGNOSIS — R1013 Epigastric pain: Secondary | ICD-10-CM

## 2015-05-03 NOTE — Telephone Encounter (Signed)
Patient took in the specimen in to the lab today. Protonix and Robinul Forte are being taken BID. CT of abd/pelvis ordered for 05/05/15 at 4:00 pm. Pre-visit on 5/12 at 8:00 am. EGD on 05/11/15 at 10:00 am. Instructions for the CT at the front desk for pick up. Patient agrees to this.

## 2015-05-04 ENCOUNTER — Telehealth: Payer: Self-pay | Admitting: Physician Assistant

## 2015-05-04 LAB — HELICOBACTER PYLORI  SPECIAL ANTIGEN: H. PYLORI Antigen: NEGATIVE

## 2015-05-05 ENCOUNTER — Ambulatory Visit (INDEPENDENT_AMBULATORY_CARE_PROVIDER_SITE_OTHER)
Admission: RE | Admit: 2015-05-05 | Discharge: 2015-05-05 | Disposition: A | Discharge status: Home / Self Care | Payer: BLUE CROSS/BLUE SHIELD | Source: Ambulatory Visit | Attending: Gastroenterology | Admitting: Gastroenterology

## 2015-05-05 ENCOUNTER — Ambulatory Visit (AMBULATORY_SURGERY_CENTER): Payer: Self-pay | Admitting: *Deleted

## 2015-05-05 VITALS — Ht 66.0 in | Wt 195.8 lb

## 2015-05-05 DIAGNOSIS — R109 Unspecified abdominal pain: Secondary | ICD-10-CM | POA: Diagnosis not present

## 2015-05-05 MED ORDER — IOHEXOL 300 MG/ML  SOLN
100.0000 mL | Freq: Once | INTRAMUSCULAR | Status: AC | PRN
  Administered 2015-05-05: 100 mL via INTRAVENOUS

## 2015-05-05 NOTE — Progress Notes (Signed)
No egg or soy allergy  No anesthesia or intubation problems per pt  No diet medications taken   

## 2015-05-05 NOTE — Telephone Encounter (Signed)
Continue Robinul and Protonix. She has her pre-visit and her CT today.

## 2015-05-09 ENCOUNTER — Encounter: Payer: Self-pay | Admitting: Internal Medicine

## 2015-05-09 NOTE — Progress Notes (Signed)
noted 

## 2015-05-11 ENCOUNTER — Encounter: Payer: Self-pay | Admitting: Gastroenterology

## 2015-05-12 ENCOUNTER — Other Ambulatory Visit: Payer: Self-pay | Admitting: Urology

## 2015-05-16 ENCOUNTER — Other Ambulatory Visit: Payer: Self-pay | Admitting: Urology

## 2015-05-17 ENCOUNTER — Encounter (HOSPITAL_BASED_OUTPATIENT_CLINIC_OR_DEPARTMENT_OTHER): Payer: Self-pay | Admitting: *Deleted

## 2015-05-17 NOTE — Progress Notes (Signed)
NPO AFTER MN.  ARRIVE AT 0700.  NEEDS HG. WILL TAKE DITROPAN AND IF NEEDED OXYCODONE AM DOS W/ SIPS OF WATER.

## 2015-05-18 ENCOUNTER — Ambulatory Visit (HOSPITAL_BASED_OUTPATIENT_CLINIC_OR_DEPARTMENT_OTHER): Payer: BLUE CROSS/BLUE SHIELD | Admitting: Anesthesiology

## 2015-05-18 ENCOUNTER — Encounter (HOSPITAL_BASED_OUTPATIENT_CLINIC_OR_DEPARTMENT_OTHER)
Admission: RE | Disposition: A | Discharge status: Home / Self Care | Payer: Self-pay | Source: Ambulatory Visit | Attending: Urology

## 2015-05-18 ENCOUNTER — Encounter (HOSPITAL_BASED_OUTPATIENT_CLINIC_OR_DEPARTMENT_OTHER): Payer: Self-pay | Admitting: *Deleted

## 2015-05-18 ENCOUNTER — Ambulatory Visit (HOSPITAL_BASED_OUTPATIENT_CLINIC_OR_DEPARTMENT_OTHER)
Admission: RE | Admit: 2015-05-18 | Discharge: 2015-05-18 | Disposition: A | Discharge status: Home / Self Care | Payer: BLUE CROSS/BLUE SHIELD | Source: Ambulatory Visit | Attending: Urology | Admitting: Urology

## 2015-05-18 DIAGNOSIS — Z9889 Other specified postprocedural states: Secondary | ICD-10-CM | POA: Insufficient documentation

## 2015-05-18 DIAGNOSIS — N132 Hydronephrosis with renal and ureteral calculous obstruction: Secondary | ICD-10-CM | POA: Insufficient documentation

## 2015-05-18 DIAGNOSIS — Z87891 Personal history of nicotine dependence: Secondary | ICD-10-CM | POA: Insufficient documentation

## 2015-05-18 DIAGNOSIS — K219 Gastro-esophageal reflux disease without esophagitis: Secondary | ICD-10-CM | POA: Diagnosis not present

## 2015-05-18 DIAGNOSIS — N201 Calculus of ureter: Secondary | ICD-10-CM | POA: Diagnosis present

## 2015-05-18 HISTORY — DX: Personal history of other diseases of the female genital tract: Z87.42

## 2015-05-18 HISTORY — PX: CYSTOSCOPY WITH HOLMIUM LASER LITHOTRIPSY: SHX6639

## 2015-05-18 HISTORY — DX: Personal history of colonic polyps: Z86.010

## 2015-05-18 HISTORY — DX: Personal history of colon polyps, unspecified: Z86.0100

## 2015-05-18 HISTORY — PX: CYSTOSCOPY WITH URETEROSCOPY AND STENT PLACEMENT: SHX6377

## 2015-05-18 HISTORY — DX: Gastro-esophageal reflux disease without esophagitis: K21.9

## 2015-05-18 LAB — POCT HEMOGLOBIN-HEMACUE: Hemoglobin: 14.1 g/dL (ref 12.0–15.0)

## 2015-05-18 SURGERY — CYSTOURETEROSCOPY, WITH STENT INSERTION
Anesthesia: General | Site: Ureter | Laterality: Left

## 2015-05-18 MED ORDER — LACTATED RINGERS IV SOLN
INTRAVENOUS | Status: DC
  Administered 2015-05-18: 08:00:00 via INTRAVENOUS
  Filled 2015-05-18: qty 1000

## 2015-05-18 MED ORDER — DEXAMETHASONE SODIUM PHOSPHATE 4 MG/ML IJ SOLN
INTRAMUSCULAR | Status: DC | PRN
  Administered 2015-05-18: 10 mg via INTRAVENOUS

## 2015-05-18 MED ORDER — OXYCODONE HCL 5 MG PO TABS
5.0000 mg | ORAL_TABLET | Freq: Once | ORAL | Status: AC | PRN
  Administered 2015-05-18: 5 mg via ORAL
  Filled 2015-05-18: qty 1

## 2015-05-18 MED ORDER — CEPHALEXIN 500 MG PO CAPS: 500.0000 mg | ORAL_CAPSULE | Freq: Two times a day (BID) | ORAL | Status: DC

## 2015-05-18 MED ORDER — SODIUM CHLORIDE 0.9 % IR SOLN
Status: DC | PRN
  Administered 2015-05-18: 3000 mL

## 2015-05-18 MED ORDER — OXYCODONE HCL 5 MG/5ML PO SOLN
5.0000 mg | Freq: Once | ORAL | Status: AC | PRN
  Filled 2015-05-18: qty 5

## 2015-05-18 MED ORDER — PROPOFOL 10 MG/ML IV BOLUS
INTRAVENOUS | Status: DC | PRN
  Administered 2015-05-18: 200 mg via INTRAVENOUS

## 2015-05-18 MED ORDER — SCOPOLAMINE 1 MG/3DAYS TD PT72
1.0000 | MEDICATED_PATCH | TRANSDERMAL | Status: DC
  Administered 2015-05-18: 1.5 mg via TRANSDERMAL
  Filled 2015-05-18: qty 1

## 2015-05-18 MED ORDER — OXYBUTYNIN CHLORIDE 5 MG PO TABS
ORAL_TABLET | ORAL | Status: AC
  Filled 2015-05-18: qty 1

## 2015-05-18 MED ORDER — HYDROMORPHONE HCL 1 MG/ML IJ SOLN
INTRAMUSCULAR | Status: AC
  Filled 2015-05-18: qty 1

## 2015-05-18 MED ORDER — ACETAMINOPHEN 10 MG/ML IV SOLN
INTRAVENOUS | Status: DC | PRN
  Administered 2015-05-18: 1000 mg via INTRAVENOUS

## 2015-05-18 MED ORDER — PROMETHAZINE HCL 25 MG/ML IJ SOLN
6.2500 mg | INTRAMUSCULAR | Status: DC | PRN
Start: 2015-05-18 — End: 2015-05-18
  Filled 2015-05-18: qty 1

## 2015-05-18 MED ORDER — OXYCODONE HCL 5 MG PO TABS
ORAL_TABLET | ORAL | Status: AC
  Filled 2015-05-18: qty 1

## 2015-05-18 MED ORDER — LIDOCAINE HCL (CARDIAC) 20 MG/ML IV SOLN
INTRAVENOUS | Status: DC | PRN
  Administered 2015-05-18: 100 mg via INTRAVENOUS

## 2015-05-18 MED ORDER — OXYCODONE-ACETAMINOPHEN 7.5-325 MG PO TABS: 1.0000 | ORAL_TABLET | ORAL | Status: DC | PRN

## 2015-05-18 MED ORDER — FENTANYL CITRATE (PF) 100 MCG/2ML IJ SOLN
INTRAMUSCULAR | Status: AC
  Filled 2015-05-18: qty 4

## 2015-05-18 MED ORDER — OXYBUTYNIN CHLORIDE 5 MG PO TABS
5.0000 mg | ORAL_TABLET | Freq: Once | ORAL | Status: AC
  Administered 2015-05-18: 5 mg via ORAL
  Filled 2015-05-18: qty 1

## 2015-05-18 MED ORDER — CEFAZOLIN SODIUM-DEXTROSE 2-3 GM-% IV SOLR
2.0000 g | Freq: Once | INTRAVENOUS | Status: AC
  Administered 2015-05-18: 2 g via INTRAVENOUS
  Filled 2015-05-18: qty 50

## 2015-05-18 MED ORDER — SENNOSIDES-DOCUSATE SODIUM 8.6-50 MG PO TABS: 1.0000 | ORAL_TABLET | Freq: Two times a day (BID) | ORAL | Status: DC

## 2015-05-18 MED ORDER — MIDAZOLAM HCL 2 MG/2ML IJ SOLN
INTRAMUSCULAR | Status: AC
  Filled 2015-05-18: qty 2

## 2015-05-18 MED ORDER — FENTANYL CITRATE (PF) 100 MCG/2ML IJ SOLN
INTRAMUSCULAR | Status: DC | PRN
  Administered 2015-05-18: 25 ug via INTRAVENOUS
  Administered 2015-05-18 (×2): 50 ug via INTRAVENOUS
  Administered 2015-05-18: 25 ug via INTRAVENOUS
  Administered 2015-05-18: 50 ug via INTRAVENOUS

## 2015-05-18 MED ORDER — ONDANSETRON HCL 4 MG/2ML IJ SOLN
INTRAMUSCULAR | Status: DC | PRN
  Administered 2015-05-18: 4 mg via INTRAVENOUS

## 2015-05-18 MED ORDER — MIDAZOLAM HCL 5 MG/5ML IJ SOLN
INTRAMUSCULAR | Status: DC | PRN
  Administered 2015-05-18: 2 mg via INTRAVENOUS

## 2015-05-18 MED ORDER — EPHEDRINE SULFATE 50 MG/ML IJ SOLN
INTRAMUSCULAR | Status: DC | PRN
  Administered 2015-05-18 (×2): 12.5 mg via INTRAVENOUS

## 2015-05-18 MED ORDER — HYDROMORPHONE HCL 1 MG/ML IJ SOLN
0.2500 mg | INTRAMUSCULAR | Status: DC | PRN
  Administered 2015-05-18 (×2): 0.5 mg via INTRAVENOUS
  Filled 2015-05-18: qty 1

## 2015-05-18 MED ORDER — KETOROLAC TROMETHAMINE 30 MG/ML IJ SOLN
INTRAMUSCULAR | Status: DC | PRN
  Administered 2015-05-18: 30 mg via INTRAVENOUS

## 2015-05-18 SURGICAL SUPPLY — 23 items
BAG URO CATCHER STRL LF (DRAPE) ×2 IMPLANT
BASKET LASER NITINOL 1.9FR (BASKET) ×2 IMPLANT
BASKET ZERO TIP NITINOL 2.4FR (BASKET) IMPLANT
CATH INTERMIT  6FR 70CM (CATHETERS) ×2 IMPLANT
CLOTH BEACON ORANGE TIMEOUT ST (SAFETY) ×2 IMPLANT
FIBER LASER FLEXIVA 365 (UROLOGICAL SUPPLIES) IMPLANT
FIBER LASER TRAC TIP (UROLOGICAL SUPPLIES) ×2 IMPLANT
GLOVE BIO SURGEON STRL SZ 6.5 (GLOVE) ×2 IMPLANT
GLOVE BIO SURGEON STRL SZ7 (GLOVE) ×2 IMPLANT
GLOVE INDICATOR 6.5 STRL GRN (GLOVE) ×4 IMPLANT
GOWN STRL REUS W/ TWL LRG LVL3 (GOWN DISPOSABLE) ×1 IMPLANT
GOWN STRL REUS W/ TWL XL LVL3 (GOWN DISPOSABLE) ×1 IMPLANT
GOWN STRL REUS W/TWL LRG LVL3 (GOWN DISPOSABLE) ×1
GOWN STRL REUS W/TWL XL LVL3 (GOWN DISPOSABLE) ×1
GUIDEWIRE ANG ZIPWIRE 038X150 (WIRE) ×2 IMPLANT
GUIDEWIRE STR DUAL SENSOR (WIRE) ×2 IMPLANT
IV NS 1000ML (IV SOLUTION) ×1
IV NS 1000ML BAXH (IV SOLUTION) ×1 IMPLANT
IV NS IRRIG 3000ML ARTHROMATIC (IV SOLUTION) ×2 IMPLANT
PACK CYSTO (CUSTOM PROCEDURE TRAY) ×2 IMPLANT
STENT POLARIS 5FRX24 (STENTS) ×2 IMPLANT
SYRINGE 10CC LL (SYRINGE) ×2 IMPLANT
TUBE FEEDING 8FR 16IN STR KANG (MISCELLANEOUS) IMPLANT

## 2015-05-18 NOTE — Op Note (Signed)
NAMEMarland [name]  Brittany, [name] NO.:  [id number]  MEDICAL RECORD NO.:  [id number]  LOCATION:  PERIO                        FACILITY:  [facility]  PHYSICIAN:  Alexis Frock, MD     DATE OF BIRTH:  Apr 18, 1966  DATE OF PROCEDURE:  05/18/2015                               OPERATIVE REPORT   DIAGNOSIS:  Left ureteral stone, refractory flank pain.  PROCEDURE: 1. Cystoscopy with left retrograde pyelogram interpretation. 2. Left ureteroscopy with laser lithotripsy. 3. Insertion of left ureteral stent 5 x 24 Polaris with tether.  ESTIMATED BLOOD LOSS:  Nil.  COMPLICATIONS:  None.  SPECIMENS:  Left ureteral stone fragments for compositional analysis.  FINDINGS: 1. Mild-to-moderate left hydronephrosis with filling defect in     proximal ureter consistent with known stone. 2. Impacted left mid ureteral stone. 3. Complete resolution of all stone fragments larger than one-third mm     following laser lithotripsy with inspection of the left kidney and     ureter.  INDICATION:  [name] is a 49 year old lady who come in for evaluation of left flank pain, who was found to have a left mid ureteral stone. She was initially managed by medical therapy; however, she failed to pass her stone.  She was then set up for shockwave lithotripsy; however, her symptoms became quite refractory and she wished to proceed with more definitive management with ureteroscopy.  Informed consent was obtained and placed in medical record.  PROCEDURE IN DETAIL:  The patient being, [name], verified. Procedure being left ureteral stent placement was confirmed.  Procedure was carried out.  Time-out was performed.  Intravenous antibiotics were administered.  General LMA anesthesia induced.  The patient placed into a low lithotomy position.  Sterile field was created, prepping and draping the patient's vagina, introitus, and proximal thighs using iodine x3.  Next, cystourethroscopy performed using a  23-French rigid cystoscope with 30-degree offset lens.  Inspection of urinary bladder revealed no diverticula, calcifications, papular lesions.  There was mild cystoscope noted.  The left ureteral orifice was cannulated with 6- French catheter and left retrograde pyelogram was obtained.  Left retrocardiac pyelogram demonstrated a single left ureter with single system left kidney.  There was mild-to-moderate hydronephrosis And an ovoid filling defect in the upper-mid ureter consistent with known stone.  A 0.038 ZipWire was advanced at the level of the Upper pole and set aside as a safety wire.  An 8-French feeding tube was placed in urinary bladder for pressure release.  Next, semi-rigid ureteroscopy was performed of the distal left ureter alongside a separate Sensor working wire in the upper mid ureter and impacted stone was encountered with significant inflation around this with mucosal edema.  These stone appeared to be much too large for simple basketing. As such, holmium laser energy was applied to the stone using settings of 0.2 joules and 10 Hz, and the stone was fragmented into in several smaller pieces.  These were then grasped on their long axis with an escape basket removed in their entirety, set aside for compositional analysis. Inspection of the more proximal ureter revealed no abnormalities or calcifications as the goal today was to verify stone free status.  The semi-rigid ureteroscope was exchanged over the Sensor working wire for the single channel flexible digital ureteroscope and systematic inspection of the kidney was performed x2.  This revealed no calcifications in the kidney.  The ureter was inspected and found to be unremarkable except for the mucosal edema to the area of previously impacted stone.  It was felt that interval stenting would be warranted given the amount of ureteral edema.  As such, a new 5 x 24 Polaris-type stent was placed using fluoroscopic  guidance.  Good proximal and distal deployment were noted.  The tether was fashioned to the pubis and the procedure terminated.  The patient tolerated the procedure well and there were no immediate periprocedural complications.  The patient was taken to postanesthesia care unit in stable condition.          ______________________________ Alexis Frock, MD     TM/MEDQ  D:  05/18/2015  T:  05/18/2015  Job:  [id number]

## 2015-05-18 NOTE — Anesthesia Procedure Notes (Addendum)
Procedure Name: LMA Insertion Date/Time: 05/18/2015 8:33 AM Performed by: Mechele Claude Pre-anesthesia Checklist: Patient identified, Emergency Drugs available, Suction available and Patient being monitored Patient Re-evaluated:Patient Re-evaluated prior to inductionOxygen Delivery Method: Circle System Utilized Preoxygenation: Pre-oxygenation with 100% oxygen Intubation Type: IV induction Ventilation: Mask ventilation without difficulty LMA: LMA inserted LMA Size: 4.0 Number of attempts: 1 Airway Equipment and Method: bite block Placement Confirmation: positive ETCO2 Tube secured with: Tape Dental Injury: Teeth and Oropharynx as per pre-operative assessment  Comments: Limited oral opening upon induction and after two minutes of 8% sevo via mask ventilation. Able to insert #4 LMA after fully deflating it. Tongue depressor used to hold tongue out of the way. Good seal after insertion. VSS.

## 2015-05-18 NOTE — Transfer of Care (Signed)
Last Vitals:  Filed Vitals:   05/18/15 0712  BP: 124/73  Pulse: 72  Temp: 37.2 C  Resp: 14   Immediate Anesthesia Transfer of Care Note  Patient: [name]  Procedure(s) Performed: Procedure(s) (LRB): CYSTOSCOPY WITH URETEROSCOPY, STONE MANIPULATION AND STENT PLACEMENT (Left) CYSTOSCOPY WITH HOLMIUM LASER LITHOTRIPSY (Left)  Patient Location: PACU  Anesthesia Type: General  Level of Consciousness: awake, alert  and oriented  Airway & Oxygen Therapy: Patient Spontanous Breathing and Patient connected to nasal cannula oxygen  Post-op Assessment: Report given to PACU RN and Post -op Vital signs reviewed and stable  Post vital signs: Reviewed and stable  Complications: No apparent anesthesia complications

## 2015-05-18 NOTE — Brief Op Note (Signed)
05/18/2015  9:01 AM  PATIENT:  [name]  49 y.o. female  PRE-OPERATIVE DIAGNOSIS:  LEFT URETERAL STONE  POST-OPERATIVE DIAGNOSIS:  LEFT URETERAL STONE  PROCEDURE:  Procedure(s): CYSTOSCOPY WITH URETEROSCOPY, STONE MANIPULATION AND STENT PLACEMENT (Left) CYSTOSCOPY WITH HOLMIUM LASER LITHOTRIPSY (Left)  SURGEON:  Surgeon(s) and Role:    * Alexis Frock, MD - Primary  PHYSICIAN ASSISTANT:   ASSISTANTS: none   ANESTHESIA:   general  EBL:  Total I/O In: 200 [I.V.:200] Out: -   BLOOD ADMINISTERED:none  DRAINS: none   LOCAL MEDICATIONS USED:  NONE  SPECIMEN:  Source of Specimen:  Left Ureteral Stone fragments for compositional analysis  DISPOSITION OF SPECIMEN:  [facility] for compositional analysis  COUNTS:  YES  TOURNIQUET:  * No tourniquets in log *  DICTATION: .Other Dictation: Dictation Number [id number]  PLAN OF CARE: Discharge to home after PACU  PATIENT DISPOSITION:  PACU - hemodynamically stable.   Delay start of Pharmacological VTE agent (>24hrs) due to surgical blood loss or risk of bleeding: yes

## 2015-05-18 NOTE — H&P (Signed)
[name] is an 49 y.o. female.    Chief Complaint: Pre-OP Left Ureteroscopic Stone Manipulation  HPI:   1 -Left Ureteral Stone - 79mm left mid stone with mod hydro by CT 04/2015 on eval left flank pain. No fevers. No additional stones UA withtout infectious parameters. Initially given trial of void but pain refractory and she desires definitive management.  Today [name] is seen to proceed with left ureteroscopic stone manipulation.   Past Medical History  Diagnosis Date  . Anxiety   . Depression   . History of colon polyps     2008- BENIGN  . Left ureteral stone   . History of nephrolithiasis   . History of abnormal cervical Pap smear     1991 -- 1995  . Wears glasses   . GERD (gastroesophageal reflux disease)   . Frequency of urination   . Urgency of urination   . Microhematuria     Past Surgical History  Procedure Laterality Date  . Laparoscopic cholecystectomy  1999  . Tubal ligation Bilateral 1995  . Cystoscopy w/ ureteral stent placement  02/  2000  . Extracorporeal shock wave lithotripsy  2001  &  2002  . Dx laparoscopy  X2  . Exploratory laparotomy w/ bilateral salpingectomy and removal right ectopic preg.  02-23-2004  . Total abdominal hysterectomy w/ bilateral oophorectomy and lysis adhesions  09-02-2007  . Colonoscopy w/ polypectomy  05-30-2007  . Esophagogastroduodenoscopy  05-09-2007  . Umbilical hernia repair  2003  . Shoulder arthroscopy with open rotator cuff repair Right 2012    Family History  Problem Relation Age of Onset  . Hypertension Mother   . Diabetes Father   . Cancer Paternal Uncle     Pancreas  . Heart attack Maternal Grandmother   . Drug abuse Sister   . Colon cancer Neg Hx   . Esophageal cancer Neg Hx   . Stomach cancer Neg Hx   . Rectal cancer Neg Hx    Social History:  reports that she quit smoking about 7 years ago. Her smoking use included Cigarettes. She quit after 15 years of use. She has never used smokeless tobacco. She  reports that she does not drink alcohol or use illicit drugs.  Allergies:  Allergies  Allergen Reactions  . Desvenlafaxine Other (See Comments)    REACTION: withdrawl  . Iohexol      Code: RASH, Desc: PT STATES BACK IN THE 70S SHE HAD IV DYE REACTION.04/30/07/RM---pt given omnipaque 300 w/out pre meds; no complications 1/44/31 slg, Onset Date: 54008676     No prescriptions prior to admission    No results found for this or any previous visit (from the past 71 hour(s)). No results found.  Review of Systems  Constitutional: Negative.  Negative for fever and chills.  HENT: Negative.   Eyes: Negative.   Respiratory: Negative.   Cardiovascular: Negative.   Gastrointestinal: Negative.   Genitourinary: Negative.   Musculoskeletal: Negative.   Skin: Negative.   Neurological: Negative.   Endo/Heme/Allergies: Negative.   Psychiatric/Behavioral: Negative.     There were no vitals taken for this visit. Physical Exam  Constitutional: She appears well-developed.  HENT:  Head: Normocephalic.  Eyes: Pupils are equal, round, and reactive to light.  Neck: Normal range of motion.  Cardiovascular: Normal rate.   Respiratory: Effort normal.  GI: Soft.  Genitourinary:  Mild left CVAT  Musculoskeletal: Normal range of motion.  Neurological: She is alert.  Skin: Skin is warm.  Psychiatric: She has a  normal mood and affect. Her behavior is normal. Judgment and thought content normal.     Assessment/Plan   1 -Left Ureteral Stone - We discussed ureteroscopic stone manipulation with basketing and laser-lithotripsy in detail.  We discussed risks including bleeding, infection, damage to kidney / ureter  bladder, rarely loss of kidney. We discussed anesthetic risks and rare but serious surgical complications including DVT, PE, MI, and mortality. We specifically addressed that in 5-10% of cases a staged approach is required with stenting followed by re-attempt ureteroscopy if anatomy unfavorable.  The patient voiced understanding and wises to proceed.   [name] 05/18/2015, 6:40 AM

## 2015-05-18 NOTE — Anesthesia Preprocedure Evaluation (Addendum)
Anesthesia Evaluation  Patient identified by MRN, date of birth, ID band Patient awake    Reviewed: Allergy & Precautions, NPO status , Patient's Chart, lab work & pertinent test results  Airway Mallampati: III  TM Distance: >3 FB Neck ROM: Full    Dental  (+) Teeth Intact   Pulmonary former smoker,  breath sounds clear to auscultation        Cardiovascular negative cardio ROS  Rhythm:Regular Rate:Normal     Neuro/Psych  Headaches, Anxiety Depression    GI/Hepatic Neg liver ROS, GERD-  ,  Endo/Other  Morbid obesity  Renal/GU Left ureteral stone     Musculoskeletal   Abdominal   Peds  Hematology negative hematology ROS (+)   Anesthesia Other Findings   Reproductive/Obstetrics                            Anesthesia Physical Anesthesia Plan  ASA: II  Anesthesia Plan: General   Post-op Pain Management:    Induction: Intravenous  Airway Management Planned: LMA  Additional Equipment:   Intra-op Plan:   Post-operative Plan:   Informed Consent: I have reviewed the patients History and Physical, chart, labs and discussed the procedure including the risks, benefits and alternatives for the proposed anesthesia with the patient or authorized representative who has indicated his/her understanding and acceptance.   Dental advisory given  Plan Discussed with: CRNA  Anesthesia Plan Comments:         Anesthesia Quick Evaluation

## 2015-05-18 NOTE — Discharge Instructions (Signed)
1 - You may have urinary urgency (bladder spasms) and bloody urine on / off with stent in place. This is normal.  2 - Call MD or go to ER for fever >102, severe pain / nausea / vomiting not relieved by medications, or acute change in medical status  3 - Remove tethered stent on Monday morning at home by pulling on string, then blue-white plastic tubing, and discarding. Dr. Alyson Ingles is in the office Monday if any issues arise.     [facility] [phone] Post Ureteroscopy With or Without Stent Instructions  Definitions:  Ureter: The duct that transports urine from the kidney to the bladder. Stent:   A plastic hollow tube that is placed into the ureter, from the kidney to the                 bladder to prevent the ureter from swelling shut.  GENERAL INSTRUCTIONS:  Despite the fact that no skin incisions were used, the area around the ureter and bladder is raw and irritated. The stent is a foreign body which will further irritate the bladder wall. This irritation is manifested by increased frequency of urination, both day and night, and by an increase in the urge to urinate. In some, the urge to urinate is present almost always. Sometimes the urge is strong enough that you may not be able to stop yourself from urinating. The only real cure is to remove the stent and then give time for the bladder wall to heal which can't be done until the danger of the ureter swelling shut has passed, which varies.  You may see some blood in your urine while the stent is in place and a few days afterwards. Do not be alarmed, even if the urine was clear for a while. Get off your feet and drink lots of fluids until clearing occurs. If you start to pass clots or don't improve, call us.  DIET: You may return to your normal diet immediately. Because of the raw surface of your bladder, alcohol, spicy foods, acid type foods and drinks with caffeine may cause irritation or frequency and should be used  in moderation. To keep your urine flowing freely and to avoid constipation, drink plenty of fluids during the day ( 8-10 glasses ). Tip: Avoid cranberry juice because it is very acidic.  ACTIVITY: Your physical activity doesn't need to be restricted. However, if you are very active, you may see some blood in your urine. We suggest that you reduce your activity under these circumstances until the bleeding has stopped.  BOWELS: It is important to keep your bowels regular during the postoperative period. Straining with bowel movements can cause bleeding. A bowel movement every other day is reasonable. Use a mild laxative if needed, such as Milk of Magnesia 2-3 tablespoons, or 2 Dulcolax tablets. Call if you continue to have problems. If you have been taking narcotics for pain, before, during or after your surgery, you may be constipated. Take a laxative if necessary.   MEDICATION: You should resume your pre-surgery medications unless told not to. In addition you will often be given an antibiotic to prevent infection. These should be taken as prescribed until the bottles are finished unless you are having an unusual reaction to one of the drugs.  PROBLEMS YOU SHOULD REPORT TO [place]:  Fevers over 100.5 Fahrenheit.  Heavy bleeding, or clots ( See above notes about blood in urine ).  Inability to urinate.  Drug reactions ( hives, rash,  nausea, vomiting, diarrhea ).  Severe burning or pain with urination that is not improving.  FOLLOW-UP: You will need a follow-up appointment to monitor your progress. Call for this appointment at the number listed above. Usually the first appointment will be about three to fourteen days after your surgery.     Post Anesthesia Home Care Instructions  Activity: Get plenty of rest for the remainder of the day. A responsible adult should stay with you for 24 hours following the procedure.  For the next 24 hours, DO NOT: -Drive a car -Paediatric nurse -Drink  alcoholic beverages -Take any medication unless instructed by your physician -Make any legal decisions or sign important papers.  Meals: Start with liquid foods such as gelatin or soup. Progress to regular foods as tolerated. Avoid greasy, spicy, heavy foods. If nausea and/or vomiting occur, drink only clear liquids until the nausea and/or vomiting subsides. Call your physician if vomiting continues.  Special Instructions/Symptoms: Your throat may feel dry or sore from the anesthesia or the breathing tube placed in your throat during surgery. If this causes discomfort, gargle with warm salt water. The discomfort should disappear within 24 hours.  If you had a scopolamine patch placed behind your ear for the management of post- operative nausea and/or vomiting:  1. The medication in the patch is effective for 72 hours, after which it should be removed.  Wrap patch in a tissue and discard in the trash. Wash hands thoroughly with soap and water. 2. You may remove the patch earlier than 72 hours if you experience unpleasant side effects which may include dry mouth, dizziness or visual disturbances. 3. Avoid touching the patch. Wash your hands with soap and water after contact with the patch.

## 2015-05-19 ENCOUNTER — Ambulatory Visit (HOSPITAL_COMMUNITY): Admission: RE | Admit: 2015-05-19 | Payer: BLUE CROSS/BLUE SHIELD | Source: Ambulatory Visit | Admitting: Urology

## 2015-05-19 ENCOUNTER — Encounter (HOSPITAL_COMMUNITY): Admission: RE | Payer: Self-pay | Source: Ambulatory Visit

## 2015-05-19 ENCOUNTER — Encounter (HOSPITAL_BASED_OUTPATIENT_CLINIC_OR_DEPARTMENT_OTHER): Payer: Self-pay | Admitting: Urology

## 2015-05-19 SURGERY — LITHOTRIPSY, ESWL
Anesthesia: LOCAL | Laterality: Left

## 2015-05-20 NOTE — Anesthesia Postprocedure Evaluation (Signed)
  Anesthesia Post-op Note  Patient: [name]  Procedure(s) Performed: Procedure(s): CYSTOSCOPY WITH URETEROSCOPY, STONE MANIPULATION AND STENT PLACEMENT (Left) CYSTOSCOPY WITH HOLMIUM LASER LITHOTRIPSY (Left)  Patient Location: PACU  Anesthesia Type:General  Level of Consciousness: awake and alert   Airway and Oxygen Therapy: Patient Spontanous Breathing  Post-op Pain: mild  Post-op Assessment: Post-op Vital signs reviewed  Post-op Vital Signs: Reviewed  Last Vitals:  Filed Vitals:   05/18/15 1117  BP: 137/99  Pulse: 78  Temp: 36.7 C  Resp: 18    Complications: No apparent anesthesia complications

## 2015-06-06 ENCOUNTER — Encounter: Payer: Self-pay | Admitting: Internal Medicine

## 2015-06-16 ENCOUNTER — Other Ambulatory Visit: Payer: Self-pay | Admitting: Urology

## 2015-06-17 ENCOUNTER — Encounter (HOSPITAL_BASED_OUTPATIENT_CLINIC_OR_DEPARTMENT_OTHER): Payer: Self-pay | Admitting: *Deleted

## 2015-06-20 ENCOUNTER — Encounter (HOSPITAL_BASED_OUTPATIENT_CLINIC_OR_DEPARTMENT_OTHER): Payer: Self-pay | Admitting: *Deleted

## 2015-06-20 NOTE — Progress Notes (Signed)
NPO AFTER MN.  ARRIVE AT 1000.  NEEDS HG.  MAY TAKE OXYCODONE IF NEEDED AM DOS W/ SIPS OF WATER.

## 2015-06-22 ENCOUNTER — Encounter (HOSPITAL_BASED_OUTPATIENT_CLINIC_OR_DEPARTMENT_OTHER): Payer: Self-pay

## 2015-06-22 ENCOUNTER — Encounter (HOSPITAL_BASED_OUTPATIENT_CLINIC_OR_DEPARTMENT_OTHER)
Admission: RE | Disposition: A | Discharge status: Home / Self Care | Payer: Self-pay | Source: Ambulatory Visit | Attending: Urology

## 2015-06-22 ENCOUNTER — Telehealth: Payer: Self-pay | Admitting: Obstetrics and Gynecology

## 2015-06-22 ENCOUNTER — Ambulatory Visit (HOSPITAL_BASED_OUTPATIENT_CLINIC_OR_DEPARTMENT_OTHER)
Admission: RE | Admit: 2015-06-22 | Discharge: 2015-06-22 | Disposition: A | Discharge status: Home / Self Care | Payer: BLUE CROSS/BLUE SHIELD | Source: Ambulatory Visit | Attending: Urology | Admitting: Urology

## 2015-06-22 ENCOUNTER — Ambulatory Visit (HOSPITAL_BASED_OUTPATIENT_CLINIC_OR_DEPARTMENT_OTHER): Payer: BLUE CROSS/BLUE SHIELD | Admitting: Anesthesiology

## 2015-06-22 DIAGNOSIS — Z87442 Personal history of urinary calculi: Secondary | ICD-10-CM

## 2015-06-22 DIAGNOSIS — R350 Frequency of micturition: Secondary | ICD-10-CM | POA: Diagnosis not present

## 2015-06-22 DIAGNOSIS — R312 Other microscopic hematuria: Secondary | ICD-10-CM | POA: Insufficient documentation

## 2015-06-22 DIAGNOSIS — F329 Major depressive disorder, single episode, unspecified: Secondary | ICD-10-CM | POA: Diagnosis not present

## 2015-06-22 DIAGNOSIS — N1330 Unspecified hydronephrosis: Secondary | ICD-10-CM | POA: Insufficient documentation

## 2015-06-22 DIAGNOSIS — F419 Anxiety disorder, unspecified: Secondary | ICD-10-CM | POA: Insufficient documentation

## 2015-06-22 DIAGNOSIS — R3915 Urgency of urination: Secondary | ICD-10-CM | POA: Diagnosis not present

## 2015-06-22 DIAGNOSIS — K219 Gastro-esophageal reflux disease without esophagitis: Secondary | ICD-10-CM | POA: Insufficient documentation

## 2015-06-22 HISTORY — DX: Unspecified hydronephrosis: N13.30

## 2015-06-22 HISTORY — DX: Other specified postprocedural states: Z98.890

## 2015-06-22 HISTORY — DX: Nausea with vomiting, unspecified: R11.2

## 2015-06-22 HISTORY — PX: CYSTOSCOPY WITH RETROGRADE PYELOGRAM, URETEROSCOPY AND STENT PLACEMENT: SHX5789

## 2015-06-22 HISTORY — PX: CYSTOSCOPY W/ RETROGRADES: SHX1426

## 2015-06-22 LAB — POCT HEMOGLOBIN-HEMACUE: Hemoglobin: 12.8 g/dL (ref 12.0–15.0)

## 2015-06-22 SURGERY — CYSTOURETEROSCOPY, WITH RETROGRADE PYELOGRAM AND STENT INSERTION
Anesthesia: General | Site: Bladder | Laterality: Left

## 2015-06-22 MED ORDER — LACTATED RINGERS IV SOLN
INTRAVENOUS | Status: DC
  Filled 2015-06-22: qty 1000

## 2015-06-22 MED ORDER — FENTANYL CITRATE (PF) 100 MCG/2ML IJ SOLN
25.0000 ug | INTRAMUSCULAR | Status: DC | PRN
  Administered 2015-06-22: 50 ug via INTRAVENOUS
  Filled 2015-06-22: qty 1

## 2015-06-22 MED ORDER — SCOPOLAMINE 1 MG/3DAYS TD PT72
MEDICATED_PATCH | TRANSDERMAL | Status: AC
  Filled 2015-06-22: qty 1

## 2015-06-22 MED ORDER — MIDAZOLAM HCL 5 MG/5ML IJ SOLN
INTRAMUSCULAR | Status: DC | PRN
  Administered 2015-06-22: 2 mg via INTRAVENOUS

## 2015-06-22 MED ORDER — IOHEXOL 350 MG/ML SOLN
INTRAVENOUS | Status: DC | PRN
  Administered 2015-06-22: 13 mL via INTRAVENOUS

## 2015-06-22 MED ORDER — OXYCODONE-ACETAMINOPHEN 5-325 MG PO TABS
1.0000 | ORAL_TABLET | ORAL | Status: AC | PRN
  Administered 2015-06-22: 1 via ORAL
  Filled 2015-06-22: qty 2

## 2015-06-22 MED ORDER — SODIUM CHLORIDE 0.9 % IR SOLN
Status: DC | PRN
  Administered 2015-06-22: 1000 mL via INTRAVESICAL
  Administered 2015-06-22: 3000 mL via INTRAVESICAL

## 2015-06-22 MED ORDER — FENTANYL CITRATE (PF) 100 MCG/2ML IJ SOLN
INTRAMUSCULAR | Status: AC
  Filled 2015-06-22: qty 2

## 2015-06-22 MED ORDER — MIDAZOLAM HCL 2 MG/2ML IJ SOLN
INTRAMUSCULAR | Status: AC
  Filled 2015-06-22: qty 2

## 2015-06-22 MED ORDER — DEXAMETHASONE SODIUM PHOSPHATE 4 MG/ML IJ SOLN
INTRAMUSCULAR | Status: DC | PRN
  Administered 2015-06-22: 10 mg via INTRAVENOUS

## 2015-06-22 MED ORDER — OXYCODONE-ACETAMINOPHEN 5-325 MG PO TABS
ORAL_TABLET | ORAL | Status: AC
  Filled 2015-06-22: qty 1

## 2015-06-22 MED ORDER — ONDANSETRON HCL 4 MG/2ML IJ SOLN
INTRAMUSCULAR | Status: DC | PRN
  Administered 2015-06-22: 4 mg via INTRAVENOUS

## 2015-06-22 MED ORDER — FENTANYL CITRATE (PF) 100 MCG/2ML IJ SOLN
INTRAMUSCULAR | Status: DC | PRN
  Administered 2015-06-22 (×2): 50 ug via INTRAVENOUS

## 2015-06-22 MED ORDER — FENTANYL CITRATE (PF) 100 MCG/2ML IJ SOLN
INTRAMUSCULAR | Status: AC
  Filled 2015-06-22: qty 4

## 2015-06-22 MED ORDER — METOCLOPRAMIDE HCL 5 MG/ML IJ SOLN
INTRAMUSCULAR | Status: DC | PRN
  Administered 2015-06-22: 5 mg via INTRAVENOUS

## 2015-06-22 MED ORDER — PROPOFOL 10 MG/ML IV BOLUS
INTRAVENOUS | Status: DC | PRN
Start: 2015-06-22 — End: 2015-06-22
  Administered 2015-06-22: 40 mg via INTRAVENOUS
  Administered 2015-06-22: 200 mg via INTRAVENOUS
  Administered 2015-06-22: 100 mg via INTRAVENOUS

## 2015-06-22 MED ORDER — LIDOCAINE HCL (CARDIAC) 20 MG/ML IV SOLN
INTRAVENOUS | Status: DC | PRN
Start: 2015-06-22 — End: 2015-06-22
  Administered 2015-06-22: 100 mg via INTRAVENOUS

## 2015-06-22 MED ORDER — SCOPOLAMINE 1 MG/3DAYS TD PT72
1.0000 | MEDICATED_PATCH | TRANSDERMAL | Status: DC
  Administered 2015-06-22: 1.5 mg via TRANSDERMAL
  Filled 2015-06-22: qty 1

## 2015-06-22 MED ORDER — OXYCODONE-ACETAMINOPHEN 7.5-325 MG PO TABS: 1.0000 | ORAL_TABLET | ORAL | Status: DC | PRN

## 2015-06-22 MED ORDER — CEFTRIAXONE SODIUM 2 G IJ SOLR
INTRAMUSCULAR | Status: AC
  Filled 2015-06-22: qty 2

## 2015-06-22 MED ORDER — OXYCODONE HCL 5 MG PO TABS
5.0000 mg | ORAL_TABLET | Freq: Once | ORAL | Status: DC
  Filled 2015-06-22: qty 1

## 2015-06-22 MED ORDER — CEFTRIAXONE SODIUM IN DEXTROSE 20 MG/ML IV SOLN
1.0000 g | INTRAVENOUS | Status: DC
  Filled 2015-06-22: qty 50

## 2015-06-22 MED ORDER — OXYCODONE HCL 5 MG PO TABS
ORAL_TABLET | ORAL | Status: AC
  Filled 2015-06-22: qty 1

## 2015-06-22 MED ORDER — CEFTRIAXONE SODIUM IN DEXTROSE 40 MG/ML IV SOLN
2.0000 g | INTRAVENOUS | Status: AC
  Administered 2015-06-22: 2 g via INTRAVENOUS
  Filled 2015-06-22: qty 50

## 2015-06-22 MED ORDER — LACTATED RINGERS IV SOLN
INTRAVENOUS | Status: DC
  Administered 2015-06-22: 11:00:00 via INTRAVENOUS
  Filled 2015-06-22: qty 1000

## 2015-06-22 SURGICAL SUPPLY — 13 items
BAG URO CATCHER STRL LF (DRAPE) ×3 IMPLANT
CATH INTERMIT  6FR 70CM (CATHETERS) ×3 IMPLANT
CLOTH BEACON ORANGE TIMEOUT ST (SAFETY) ×3 IMPLANT
GLOVE BIO SURGEON STRL SZ8 (GLOVE) ×3 IMPLANT
GOWN STRL REUS W/ TWL LRG LVL3 (GOWN DISPOSABLE) ×2 IMPLANT
GOWN STRL REUS W/ TWL XL LVL3 (GOWN DISPOSABLE) ×2 IMPLANT
GOWN STRL REUS W/TWL LRG LVL3 (GOWN DISPOSABLE) ×1
GOWN STRL REUS W/TWL XL LVL3 (GOWN DISPOSABLE) ×1
GUIDEWIRE STR DUAL SENSOR (WIRE) ×3 IMPLANT
IV NS IRRIG 3000ML ARTHROMATIC (IV SOLUTION) ×3 IMPLANT
MANIFOLD NEPTUNE II (INSTRUMENTS) ×3 IMPLANT
PACK CYSTO (CUSTOM PROCEDURE TRAY) ×3 IMPLANT
SYRINGE 10CC LL (SYRINGE) ×3 IMPLANT

## 2015-06-22 NOTE — Discharge Instructions (Signed)

## 2015-06-22 NOTE — Transfer of Care (Signed)
Last Vitals:  Filed Vitals:   06/22/15 1033  BP: 138/73  Pulse: 91  Temp: 37 C  Resp: 16    Immediate Anesthesia Transfer of Care Note  Patient: [name]  Procedure(s) Performed: Procedure(s) (LRB): CYSTOSCOPY,  LEFT URETEROSCOPY (Left) CYSTOSCOPY WITH RETROGRADE PYELOGRAM (Bilateral)  Patient Location: PACU  Anesthesia Type: General  Level of Consciousness: awake, alert  and oriented  Airway & Oxygen Therapy: Patient Spontanous Breathing and Patient connected to face mask oxygen  Post-op Assessment: Report given to PACU RN and Post -op Vital signs reviewed and stable  Post vital signs: Reviewed and stable  Complications: No apparent anesthesia complications

## 2015-06-22 NOTE — Anesthesia Procedure Notes (Signed)
Procedure Name: LMA Insertion Date/Time: 06/22/2015 11:59 AM Performed by: Bethena Roys T Pre-anesthesia Checklist: Patient identified, Emergency Drugs available, Suction available and Patient being monitored Patient Re-evaluated:Patient Re-evaluated prior to inductionOxygen Delivery Method: Circle System Utilized Preoxygenation: Pre-oxygenation with 100% oxygen Intubation Type: IV induction Ventilation: Mask ventilation without difficulty LMA: LMA inserted LMA Size: 4.0 Number of attempts: 1 Airway Equipment and Method: Bite block Placement Confirmation: positive ETCO2 Tube secured with: Tape Dental Injury: Teeth and Oropharynx as per pre-operative assessment

## 2015-06-22 NOTE — Telephone Encounter (Signed)
Patient calling to speak with the nurse about "problems with my bladder that are effecting my kidneys."

## 2015-06-22 NOTE — H&P (Signed)
Urology Admission H&P  Chief Complaint: left flank pain History of Present Illness: [name] is a 49yo with a hx of nephrolithiasis s/p [name] URS who has persistent bilateral flank pain, left greater than right. On renal US 1 month after procedure she was found to have persistent left hydronephrosis. Today she complains of right sharp, intermittent flank pain.  Past Medical History  Diagnosis Date  . Anxiety   . Depression   . History of colon polyps     2008- BENIGN  . Left ureteral stone   . History of nephrolithiasis   . History of abnormal cervical Pap smear     1991 -- 1995  . Wears glasses   . GERD (gastroesophageal reflux disease)   . Frequency of urination   . Urgency of urination   . Microhematuria   . Hydronephrosis, left   . PONV (postoperative nausea and vomiting)    Past Surgical History  Procedure Laterality Date  . Laparoscopic cholecystectomy  1999  . Tubal ligation Bilateral 1995  . Cystoscopy w/ ureteral stent placement  02/  2000  . Extracorporeal shock wave lithotripsy  2001  &  2002  . Dx laparoscopy  X2  . Exploratory laparotomy w/ bilateral salpingectomy and removal right ectopic preg.  02-23-2004  . Total abdominal hysterectomy w/ bilateral oophorectomy and lysis adhesions  09-02-2007  . Colonoscopy w/ polypectomy  05-30-2007  . Esophagogastroduodenoscopy  05-09-2007  . Umbilical hernia repair  2003  . Shoulder arthroscopy with open rotator cuff repair Right 2012  . Cystoscopy with ureteroscopy and stent placement Left 05/18/2015    Procedure: CYSTOSCOPY WITH URETEROSCOPY, [place];  Surgeon: Alexis Frock, MD;  Location: [facility];  Service: Urology;  Laterality: Left;  . Cystoscopy with holmium laser lithotripsy Left 05/18/2015    Procedure: CYSTOSCOPY WITH HOLMIUM LASER LITHOTRIPSY;  Surgeon: Alexis Frock, MD;  Location: [facility];  Service: Urology;  Laterality: Left;    Home  Medications:  Prescriptions prior to admission  Medication Sig Dispense Refill Last Dose  . buPROPion (WELLBUTRIN XL) 150 MG 24 hr tablet Take 1 tablet (150 mg total) by mouth daily. (Patient taking differently: Take 150 mg by mouth at bedtime. ) 30 tablet 2 06/21/2015 at Unknown time  . FLUoxetine (PROZAC) 20 MG tablet Take 1 tablet (20 mg total) by mouth daily. (Patient taking differently: Take 20 mg by mouth at bedtime. ) 30 tablet 3 06/21/2015 at Unknown time  . oxyCODONE-acetaminophen (PERCOCET) 7.5-325 MG per tablet Take 1 tablet by mouth every 4 (four) hours as needed for moderate pain or severe pain. Post-operatively 30 tablet 0 06/21/2015 at Unknown time  . tamsulosin (FLOMAX) 0.4 MG CAPS capsule Take 0.4 mg by mouth daily after supper.   06/21/2015 at Unknown time   Allergies:  Allergies  Allergen Reactions  . Desvenlafaxine Other (See Comments)    REACTION: withdrawl  . Iohexol      Code: RASH, Desc: PT STATES BACK IN THE 70S SHE HAD IV DYE REACTION.04/30/07/RM---pt given omnipaque 300 w/out pre meds; no complications 0/32/12 slg, Onset Date: 24825003     Family History  Problem Relation Age of Onset  . Hypertension Mother   . Diabetes Father   . Cancer Paternal Uncle     Pancreas  . Heart attack Maternal Grandmother   . Drug abuse Sister   . Colon cancer Neg Hx   . Esophageal cancer Neg Hx   . Stomach cancer Neg Hx   .  Rectal cancer Neg Hx    Social History:  reports that she quit smoking about 7 years ago. Her smoking use included Cigarettes. She quit after 15 years of use. She has never used smokeless tobacco. She reports that she does not drink alcohol or use illicit drugs.  Review of Systems  Genitourinary: Positive for dysuria, urgency, frequency and flank pain.  All other systems reviewed and are negative.   Physical Exam:  Vital signs in last 24 hours: Temp:  [98.6 F (37 C)] 98.6 F (37 C) (06/29 1033) Pulse Rate:  [91] 91 (06/29 1033) Resp:  [16] 16 (06/29  1033) BP: (138)/(73) 138/73 mmHg (06/29 1033) SpO2:  [100 %] 100 % (06/29 1033) Weight:  [94.802 kg (209 lb)] 94.802 kg (209 lb) (06/29 1028) Physical Exam  Constitutional: She is oriented to person, place, and time. She appears well-developed and well-nourished.  HENT:  Head: Normocephalic and atraumatic.  Eyes: EOM are normal. Pupils are equal, round, and reactive to light.  Neck: Normal range of motion. Neck supple. No thyromegaly present.  Cardiovascular: Normal rate and regular rhythm.   Respiratory: Effort normal and breath sounds normal.  GI: Soft. She exhibits no distension and no mass. There is no tenderness. There is no rebound and no guarding.  Musculoskeletal: Normal range of motion.  Neurological: She is alert and oriented to person, place, and time.  Skin: Skin is warm and dry.  Psychiatric: She has a normal mood and affect. Her behavior is normal. Judgment and thought content normal.    Laboratory Data:  Results for orders placed or performed during the hospital encounter of 06/22/15 (from the past 24 hour(s))  Hemoglobin-hemacue, POC     Status: None   Collection Time: 06/22/15 10:57 AM  Result Value Ref Range   Hemoglobin 12.8 12.0 - 15.0 g/dL   No results found for this or any previous visit (from the past 240 hour(s)). Creatinine: No results for input(s): CREATININE in the last 168 hours.  Impression/Assessment:  Bilateral flank pain , left hydronephrosis  Plan:  1. Risks/benefits/alternatives to diagnostic ureteroscopy was explained to the patient and she understands and wishes to proceed with surgery. Surgery scheduled for today  [name] 06/22/2015, 12:04 PM

## 2015-06-22 NOTE — Anesthesia Preprocedure Evaluation (Addendum)
Anesthesia Evaluation  Patient identified by MRN, date of birth, ID band Patient awake    Reviewed: Allergy & Precautions, H&P , NPO status , Patient's Chart, lab work & pertinent test results, reviewed documented beta blocker date and time   History of Anesthesia Complications (+) PONV  Airway Mallampati: III  TM Distance: >3 FB Neck ROM: Full    Dental no notable dental hx. (+) Teeth Intact   Pulmonary neg pulmonary ROS, former smoker,  breath sounds clear to auscultation  Pulmonary exam normal       Cardiovascular Exercise Tolerance: Good negative cardio ROS Normal cardiovascular examRhythm:Regular Rate:Normal     Neuro/Psych  Headaches, Anxiety Depression negative neurological ROS  negative psych ROS   GI/Hepatic negative GI ROS, Neg liver ROS, GERD-  ,  Endo/Other  negative endocrine ROS  Renal/GU negative Renal ROSLeft ureteral stone  negative genitourinary   Musculoskeletal   Abdominal   Peds  Hematology negative hematology ROS (+)   Anesthesia Other Findings   Reproductive/Obstetrics negative OB ROS                            Anesthesia Physical Anesthesia Plan  ASA: II  Anesthesia Plan: General   Post-op Pain Management:    Induction: Intravenous  Airway Management Planned: LMA  Additional Equipment:   Intra-op Plan:   Post-operative Plan:   Informed Consent: I have reviewed the patients History and Physical, chart, labs and discussed the procedure including the risks, benefits and alternatives for the proposed anesthesia with the patient or authorized representative who has indicated his/her understanding and acceptance.   Dental Advisory Given  Plan Discussed with: CRNA and Surgeon  Anesthesia Plan Comments:        Anesthesia Quick Evaluation

## 2015-06-22 NOTE — Telephone Encounter (Signed)
Left message to call [place] at [phone]. 

## 2015-06-22 NOTE — Telephone Encounter (Signed)
Spoke with patient. Patient states that she had surgery to have a kidney stone removed in May. Recently began to have lower back pain and urinary symptoms. Went in for a procedure today for removal of possible kidney stone and was advise there is not a kidney stone. Patient states that urologist feels her "dropped bladder" is what is causing her to have the lower back pain and symptoms. States that urologist recommend she see someone regarding surgery for bladder prolapse. States she has discussed this with Dr.Silva before and would now like to proceed. Would like to be seen to further discuss her options. Appointment scheduled for 06/24/2015 at 10:30am with Dr.Silva. Patient is agreeable and verbalizes understanding.  Routing to provider for final review. Patient agreeable to disposition. Will close encounter.

## 2015-06-22 NOTE — Anesthesia Postprocedure Evaluation (Signed)
Anesthesia Post Note  Patient: [name]  Procedure(s) Performed: Procedure(s) (LRB): CYSTOSCOPY,  LEFT URETEROSCOPY (Left) CYSTOSCOPY WITH RETROGRADE PYELOGRAM (Bilateral)  Anesthesia type: general  Patient location: PACU  Post pain: Pain level controlled  Post assessment: Patient's Cardiovascular Status Stable  Last Vitals:  Filed Vitals:   06/22/15 1300  BP: 101/50  Pulse: 74  Temp:   Resp: 18    Post vital signs: Reviewed and stable  Level of consciousness: sedated  Complications: No apparent anesthesia complications

## 2015-06-22 NOTE — Brief Op Note (Signed)
06/22/2015  12:26 PM  PATIENT:  [name]  49 y.o. female  PRE-OPERATIVE DIAGNOSIS:  LEFT HYDRONEPHROSIS  POST-OPERATIVE DIAGNOSIS:  LEFT HYDRONEPHROSIS  PROCEDURE:  Procedure(s): CYSTOSCOPY,  LEFT URETEROSCOPY (Left) CYSTOSCOPY WITH RETROGRADE PYELOGRAM (Bilateral)  SURGEON:  Surgeon(s) and Role:    * Cleon Gustin, MD - Primary  PHYSICIAN ASSISTANT:   ASSISTANTS: none   ANESTHESIA:   general  EBL:  Total I/O In: 200 [I.V.:200] Out: -   BLOOD ADMINISTERED:none  DRAINS: none   LOCAL MEDICATIONS USED:  NONE  SPECIMEN:  No Specimen  DISPOSITION OF SPECIMEN:  N/A  COUNTS:  YES  TOURNIQUET:  * No tourniquets in log *  DICTATION: .Dragon Dictation  PLAN OF CARE: Discharge to home after PACU  PATIENT DISPOSITION:  PACU - hemodynamically stable.   Delay start of Pharmacological VTE agent (>24hrs) due to surgical blood loss or risk of bleeding: not applicable

## 2015-06-23 ENCOUNTER — Encounter (HOSPITAL_BASED_OUTPATIENT_CLINIC_OR_DEPARTMENT_OTHER): Payer: Self-pay | Admitting: Urology

## 2015-06-24 ENCOUNTER — Ambulatory Visit (INDEPENDENT_AMBULATORY_CARE_PROVIDER_SITE_OTHER): Payer: BLUE CROSS/BLUE SHIELD | Admitting: Obstetrics and Gynecology

## 2015-06-24 ENCOUNTER — Encounter: Payer: Self-pay | Admitting: Obstetrics and Gynecology

## 2015-06-24 VITALS — BP 110/68 | HR 64 | Resp 14 | Wt 215.0 lb

## 2015-06-24 DIAGNOSIS — N393 Stress incontinence (female) (male): Secondary | ICD-10-CM | POA: Diagnosis not present

## 2015-06-24 DIAGNOSIS — N8110 Cystocele, unspecified: Secondary | ICD-10-CM | POA: Diagnosis not present

## 2015-06-24 DIAGNOSIS — IMO0002 Reserved for concepts with insufficient information to code with codable children: Secondary | ICD-10-CM

## 2015-06-24 NOTE — Progress Notes (Signed)
Patient ID: [name], female   DOB: July 02, 1966, 49 y.o.   MRN: [id number] GYNECOLOGY  VISIT   HPI: 49 y.o.   Married  Caucasian  female   [phone] with No LMP recorded. Patient has had a hysterectomy.   here to discuss possible surgery for bladder prolapse.  Status post hysterectomy.  Known prolapse.   Patient presented with abdominal pain.  Ultimately saw GI and evaluation was normal. No colonoscopy.  Recent CT showing renal stones on left side.  Dr. Alyson Ingles, urology, did a urethral approach for removal. Stent placed and later removed.  Pain returned to lower abdomen. Returned to OR this week with Dr. Alyson Ingles and no stones were seen. Noted to have dropped bladder and saw told to follow up with GYN. Told her hydronephrosis may be due to her vaginal prolapse.  Can leak urine if cannot empty urgently.  Leaks a very little if coughs, laughs, or sneezes. No leak for no reason.  Voids twice to empty her bladder.  BMs are happening well.  No leakage of stool.   No urodynamic testing done to date.   GYNECOLOGIC HISTORY: No LMP recorded. Patient has had a hysterectomy. Contraception:Hysterectomy Menopausal hormone therapy: None Last mammogram: 01/25/15 [facility]) Last pap smear: Hysterectomy         OB History    Gravida Para Term Preterm AB TAB SAB Ectopic Multiple Living   3 2 2  1   1  2          Patient Active Problem List   Diagnosis Date Noted  . Adjustment disorder with anxious mood 11/01/2014  . DYSTHYMIC DISORDER 05/25/2009  . ACNE VULGARIS, FACIAL 05/25/2009  . SEBACEOUS CYST, INFECTED 04/26/2009  . DERMATITIS, ATOPIC 12/29/2008  . RESTLESS LEG SYNDROME 09/06/2008  . TRANSIENT DISORDER INITIATING/MAINTAINING SLEEP 07/23/2008  . COLONIC POLYPS, HYPERPLASTIC 05/15/2008  . HEMORRHOIDS, INTERNAL 05/15/2008  . UMBILICAL HERNIA 76/19/5093  . MIGRAINE HEADACHE 02/26/2008  . NEPHROLITHIASIS, HX OF 09/29/2007    Past Medical  History  Diagnosis Date  . Anxiety   . Depression   . History of colon polyps     2008- BENIGN  . Left ureteral stone   . History of nephrolithiasis   . History of abnormal cervical Pap smear     1991 -- 1995  . Wears glasses   . GERD (gastroesophageal reflux disease)   . Frequency of urination   . Urgency of urination   . Microhematuria   . Hydronephrosis, left   . PONV (postoperative nausea and vomiting)     Past Surgical History  Procedure Laterality Date  . Laparoscopic cholecystectomy  1999  . Tubal ligation Bilateral 1995  . Cystoscopy w/ ureteral stent placement  02/  2000  . Extracorporeal shock wave lithotripsy  2001  &  2002  . Dx laparoscopy  X2  . Exploratory laparotomy w/ bilateral salpingectomy and removal right ectopic preg.  02-23-2004  . Total abdominal hysterectomy w/ bilateral oophorectomy and lysis adhesions  09-02-2007  . Colonoscopy w/ polypectomy  05-30-2007  . Esophagogastroduodenoscopy  05-09-2007  . Umbilical hernia repair  2003  . Shoulder arthroscopy with open rotator cuff repair Right 2012  . Cystoscopy with ureteroscopy and stent placement Left 05/18/2015    Procedure: CYSTOSCOPY WITH URETEROSCOPY, [place];  Surgeon: Alexis Frock, MD;  Location: [facility];  Service: Urology;  Laterality: Left;  . Cystoscopy with holmium laser lithotripsy Left 05/18/2015  Procedure: CYSTOSCOPY WITH HOLMIUM LASER LITHOTRIPSY;  Surgeon: Alexis Frock, MD;  Location[facility];  Service: Urology;  Laterality: Left;  . Cystoscopy with retrograde pyelogram, ureteroscopy and stent placement Left 06/22/2015    Procedure: CYSTOSCOPY,  LEFT URETEROSCOPY;  Surgeon: Cleon Gustin, MD;  Location: [facility];  Service: Urology;  Laterality: Left;  . Cystoscopy w/ retrogrades Bilateral 06/22/2015    Procedure: CYSTOSCOPY WITH RETROGRADE PYELOGRAM;  Surgeon: Cleon Gustin, MD;  Location:  [facility];  Service: Urology;  Laterality: Bilateral;    Current Outpatient Prescriptions  Medication Sig Dispense Refill  . buPROPion (WELLBUTRIN XL) 150 MG 24 hr tablet Take 1 tablet (150 mg total) by mouth daily. (Patient taking differently: Take 150 mg by mouth at bedtime. ) 30 tablet 2  . FLUoxetine (PROZAC) 20 MG tablet Take 1 tablet (20 mg total) by mouth daily. (Patient taking differently: Take 20 mg by mouth at bedtime. ) 30 tablet 3  . oxyCODONE-acetaminophen (PERCOCET) 7.5-325 MG per tablet Take 1 tablet by mouth every 4 (four) hours as needed for moderate pain or severe pain. Post-operatively 15 tablet 0   No current facility-administered medications for this visit.     ALLERGIES: Desvenlafaxine and Iohexol  Family History  Problem Relation Age of Onset  . Hypertension Mother   . Diabetes Father   . Cancer Paternal Uncle     Pancreas  . Heart attack Maternal Grandmother   . Drug abuse Sister   . Colon cancer Neg Hx   . Esophageal cancer Neg Hx   . Stomach cancer Neg Hx   . Rectal cancer Neg Hx     History   Social History  . Marital Status: Married    Spouse Name: N/A  . Number of Children: N/A  . Years of Education: N/A   Occupational History  . Not on file.   Social History Main Topics  . Smoking status: Former Smoker -- 15 years    Types: Cigarettes    Quit date: 05/16/2008  . Smokeless tobacco: Never Used  . Alcohol Use: No  . Drug Use: No  . Sexual Activity:    Partners: Male   Other Topics Concern  . Not on file   Social History Narrative    ROS:  Pertinent items are noted in HPI.  PHYSICAL EXAMINATION:    BP 110/68 mmHg  Pulse 64  Resp 14  Wt 215 lb (97.523 kg)    General appearance: alert, cooperative and appears stated age Abdomen: Pfannenstiel incision, soft, non-tender; bowel sounds normal; no masses,  no organomegaly Back:  Mild left CVA tenderness.  No right CVA.  Pelvic: External genitalia:  no lesions               Urethra:  normal appearing urethra with no masses, tenderness or lesions              Bartholins and Skenes: normal                 Vagina: normal appearing vagina with normal color and discharge, no lesions.  Third degree cystocele, less than first degree vault prolapse, minimal rectocele..              Cervix: absent           Bimanual Exam:  Uterus:  uterus absent              Adnexa: no mass, fullness, tenderness  Rectovaginal: Yes.  .  Confirms.              Anus:  normal sphincter tone, no lesions  Chaperone was present for exam.  ASSESSMENT   Status post TAH/BSO/LOA. Cystocele.  Genuine stress incontinence.  Left flank pain and hydronephrosis by patient report.  Recent nephrolithiasis.  Elevated BMI.  PLAN  Counseled regarding prolapse and incontinence.  ACOG handouts on surgery for prolapse and incontinence. Urodynamic testing discussed and will be precerted. I discussed a vaginal approach to her surgery and would favor this route at this time.   Follow up with urology for continued left flank pain.   An After Visit Summary was printed and given to the patient.  __15____ minutes face to face time of which over 50% was spent in counseling.

## 2015-06-27 NOTE — Op Note (Addendum)
.  Preoperative diagnosis: Right hydronephrosis  Postoperative diagnosis: Same  Procedure: 1 cystoscopy 2.  right retrograde pyelography 3.  Intraoperative fluoroscopy, under one hour, with interpretation 4.  Right diagnostic ureteroscopy  Attending: Rosie Fate  Anesthesia: General  Estimated blood loss: None  Drains: none  Antibiotics: Rocephin  Findings: No masses/lesions int he bladder. Normal right retrograde pyelogram. No stones or tumor seen on ureteroscopy. Grade 3 cystocele with J hooking of right ureter.  Indications: Patient is a 49 year old female/female with a history of hydronephrosis.  After discussing treatment options, she decided proceed with right diagnostic ureteroscopy  Procedure her in detail: The patient was brought to the operating room and a brief timeout was done to ensure correct patient, correct procedure, correct site.  General anesthesia was administered patient was placed in dorsal lithotomy position.  Her genitalia was then prepped and draped in usual sterile fashion.  A rigid 1 French cystoscope was passed in the urethra and the bladder.  Bladder was inspected free masses or lesions.  the right ureteral orifices were in the normal orthotopic locations.  a 6 french ureteral catheter was then instilled into the right ureter orifice.  a gentle retrograde was obtained and findings noted above.  we then placed a zip wire through the ureteral catheter and advanced up to the renal pelvis.  we then removed the cystoscope and cannulated the right ureteral orifice with a semirigid ureteroscope.  we then performed ureteroscopy up to the level of the UPJ. No stone or tumor was encountered.  the bladder was then drained and this concluded the procedure which was well tolerated by patient.  Complications: None  Condition: Stable, extubated, transferred to PACU  Plan: Pt is to followup in 2 weeks

## 2015-07-04 ENCOUNTER — Telehealth: Payer: Self-pay | Admitting: Obstetrics and Gynecology

## 2015-07-04 NOTE — Telephone Encounter (Signed)
Return call to patient. Brief description of procedure. Instructed to arrive with full bladder, no bladder medications for 3 days prior (patient denies these) and will have U/S done on 07-08-15. Urodynamic testing scheduled for 07-13-15 at 2pm.  Routing to provider for final review. Patient agreeable to disposition. Will close encounter.

## 2015-07-04 NOTE — Telephone Encounter (Signed)
Routing to Sally Yeakley, RN for review and advise. 

## 2015-07-04 NOTE — Telephone Encounter (Signed)
Patient calling to schedule urodynamics testing.

## 2015-07-08 ENCOUNTER — Ambulatory Visit (INDEPENDENT_AMBULATORY_CARE_PROVIDER_SITE_OTHER): Payer: BLUE CROSS/BLUE SHIELD

## 2015-07-08 VITALS — BP 119/72 | HR 64 | Resp 14 | Wt 217.8 lb

## 2015-07-08 DIAGNOSIS — N393 Stress incontinence (female) (male): Secondary | ICD-10-CM | POA: Diagnosis not present

## 2015-07-08 LAB — POCT URINALYSIS DIPSTICK
Leukocytes, UA: NEGATIVE
Urobilinogen, UA: NEGATIVE
pH, UA: 5

## 2015-07-08 NOTE — Progress Notes (Signed)
Patient urine was collected for Urodynamics pre-testing UA: neg Encounter routed to provider.

## 2015-07-10 NOTE — Progress Notes (Signed)
Encounter reviewed by Dr. Keavon Sensing Amundson C. Silva.  

## 2015-07-13 ENCOUNTER — Ambulatory Visit: Payer: BLUE CROSS/BLUE SHIELD

## 2015-07-18 ENCOUNTER — Encounter: Payer: Self-pay | Admitting: Obstetrics and Gynecology

## 2015-07-19 ENCOUNTER — Telehealth: Payer: Self-pay | Admitting: Emergency Medicine

## 2015-07-19 ENCOUNTER — Encounter: Payer: Self-pay | Admitting: Emergency Medicine

## 2015-07-19 NOTE — Telephone Encounter (Signed)
Call to patient. She sent mychart message requesting a letter for excuse for work to be sent to her for upcoming urodynamics procedure scheduled for tomorrow.  Advised can write note for her work. Patient will pick up letter tomorrow at appointment as she does not have a fax that the letter can be sent to. I will write letter tomorrow when patient arrives for appointment for Dr. Elza Rafter signature.  Routing update to Dr. Quincy Simmonds and will close encounter.

## 2015-07-20 ENCOUNTER — Ambulatory Visit (INDEPENDENT_AMBULATORY_CARE_PROVIDER_SITE_OTHER): Payer: BLUE CROSS/BLUE SHIELD | Admitting: Obstetrics and Gynecology

## 2015-07-20 ENCOUNTER — Encounter: Payer: Self-pay | Admitting: Emergency Medicine

## 2015-07-20 VITALS — BP 120/75 | HR 84 | Resp 16

## 2015-07-20 DIAGNOSIS — N8110 Cystocele, unspecified: Secondary | ICD-10-CM

## 2015-07-20 DIAGNOSIS — N393 Stress incontinence (female) (male): Secondary | ICD-10-CM

## 2015-07-20 DIAGNOSIS — IMO0002 Reserved for concepts with insufficient information to code with codable children: Secondary | ICD-10-CM

## 2015-07-20 NOTE — Progress Notes (Signed)
[name] is a 49 y.o. female Who presents today for urodynamics testing, ordered by Dr. Quincy Simmonds.   Allergies and medications reviewed.  Denies complaints today. No urinary complaints.   Urine Micro exam: negative for WBC's or RBC's, okay to proceed per Dr. Quincy Simmonds.  Patient reports urinary leakage with coughing, sneezing, or if cannot void in timely manner.   Urodynamics testing initiated. Lumax Bladder Catheter #10 [place] and lumax Abdominal Catheter #10 [place].   Post void residual 250 ml.   Urethral catheter placed without issue. Rectal catheter placed without issue.   Urodynamics testing completed. Please see scanned Patient summary report in Epic. Procedure completed and patient tolerated well without complaints. Patient scheduled for follow up office visit with Dr. Quincy Simmonds to discuss results. Patient agreeable.   Patient given post procedure instructions and verbalized understanding. AVS printed.

## 2015-07-20 NOTE — Patient Instructions (Signed)
.   You may have a mild bladder and rectal discomfort for a few hours after the test. . You may experience some frequent urination and slight burning the first few times you urinate after the test. Rarely, the urine may be blood tinged. These are both due to catheter placements and resolve quickly.  . You should call our office immediately if you have signs of infection, which may include bladder pain, urinary urgency, fever, or burning during urination. .  We do encourage you to drink plenty of water after completion of the test today.   

## 2015-07-21 ENCOUNTER — Encounter: Payer: Self-pay | Admitting: Internal Medicine

## 2015-07-23 ENCOUNTER — Encounter: Payer: Self-pay | Admitting: Obstetrics and Gynecology

## 2015-07-23 NOTE — Progress Notes (Signed)
Multichannel urodynamic testing done 07/20/15:  Attempt to reduce the prolapse unsuccessful with pessary in place. Pessaries expelled each time.   Uroflow - void 750 cc with PVR 250 cc. Flow not recorded. CMG - S1 266 cc, S2 366 cc, S3 655 cc, S4 772  Max capacity 767.5.  CMG with a lot of artifact.  Does not look like true detrusor instability.  LPP - 71 cm H20. UPP - 25 cm H20 and 35 H20. Pressure flow - PDet Max 78 cm H20.  Voided volume 575 cc.    Evidence of genuine stress incontinence. Large bladder volumes.  Voiding 3/4 volume on own with 1/4 residual volume.  Sling candidate. May be at some increased risk for obstruction post op.

## 2015-07-25 ENCOUNTER — Ambulatory Visit (INDEPENDENT_AMBULATORY_CARE_PROVIDER_SITE_OTHER): Payer: BLUE CROSS/BLUE SHIELD | Admitting: Obstetrics and Gynecology

## 2015-07-25 ENCOUNTER — Encounter: Payer: Self-pay | Admitting: Obstetrics and Gynecology

## 2015-07-25 VITALS — BP 110/76 | HR 80 | Ht 65.5 in | Wt 219.6 lb

## 2015-07-25 DIAGNOSIS — N393 Stress incontinence (female) (male): Secondary | ICD-10-CM | POA: Diagnosis not present

## 2015-07-25 DIAGNOSIS — N993 Prolapse of vaginal vault after hysterectomy: Secondary | ICD-10-CM | POA: Diagnosis not present

## 2015-07-25 DIAGNOSIS — N1330 Unspecified hydronephrosis: Secondary | ICD-10-CM

## 2015-07-25 DIAGNOSIS — N8110 Cystocele, unspecified: Secondary | ICD-10-CM

## 2015-07-25 DIAGNOSIS — IMO0002 Reserved for concepts with insufficient information to code with codable children: Secondary | ICD-10-CM

## 2015-07-25 NOTE — Progress Notes (Signed)
Patient ID: [name], female   DOB: 1966/09/19, 49 y.o.   MRN: [id number] GYNECOLOGY  VISIT   HPI: 50 y.o.   Married  Caucasian  female   [phone] with No LMP recorded. Patient has had a hysterectomy.   here for consultation regarding Urodynamics Results and surgical planning.  Leaks urine with cough, sneeze, or laughs.  Doe not leak for no reason.  No fecal incontinence or constipation.  By prior examination, patient has third degree cystocele, first degree vaginal vault prolapse, and minimal rectocele.   Multichannel urodynamic testing done 07/20/15:  Uroflow - void 750 cc with PVR 250 cc. Flow not recorded. CMG - S1 266 cc, S2 366 cc, S3 655 cc, S4 772  Max capacity 767.5.  CMG with a lot of artifact.  Does not look like true detrusor instability.  LPP - 71 cm H20. UPP - 25 cm H20 and 35 H20. Pressure flow - PDet Max 78 cm H20.  Voided volume 575 cc.     Recent urologic surgeries:  Had surgery on 05/18/15 for left ureteral stone and refractory flank pain - Dr. Carrie Mew 1. Cystoscopy with left retrograde pyelogram interpretation. 2. Left ureteroscopy with laser lithotripsy. 3. Insertion of left ureteral stent 5 x 24 [place] with tether.  Had surgery again on 06/22/15 for right hydronephrosis - Dr. Alyson Ingles 1.  cystoscopy 2. right retrograde pyelography 3. Intraoperative fluoroscopy, under one hour, with interpretation 4. Right diagnostic ureteroscopy     States that she has a hook in her right ureter.   Dr. Noah Delaine referred patient to see Dr. Matilde Sprang this week for prolapse and incontinence.  Patient has completed her urodynamics here but still has this appointment with him.   Has an active lifestyle and wants to get back to exercise, Zumba.   Has am umbilical hernia with mesh placement.   GYNECOLOGIC HISTORY: No LMP recorded. Patient has had a hysterectomy. Contraception: Hysterectomy Menopausal hormone therapy: None Last mammogram: 01-25-15 wnl/fatty breast  tissue:[facility]. Last pap smear: 06/2013 normal per patient        OB History    Gravida Para Term Preterm AB TAB SAB Ectopic Multiple Living   3 2 2  1   1  2          Patient Active Problem List   Diagnosis Date Noted  . Hydronephrosis, right 07/25/2015  . Adjustment disorder with anxious mood 11/01/2014  . DYSTHYMIC DISORDER 05/25/2009  . ACNE VULGARIS, FACIAL 05/25/2009  . SEBACEOUS CYST, INFECTED 04/26/2009  . DERMATITIS, ATOPIC 12/29/2008  . RESTLESS LEG SYNDROME 09/06/2008  . TRANSIENT DISORDER INITIATING/MAINTAINING SLEEP 07/23/2008  . COLONIC POLYPS, HYPERPLASTIC 05/15/2008  . HEMORRHOIDS, INTERNAL 05/15/2008  . UMBILICAL HERNIA 08/06/4817  . MIGRAINE HEADACHE 02/26/2008  . NEPHROLITHIASIS, HX OF 09/29/2007    Past Medical History  Diagnosis Date  . Anxiety   . Depression   . History of colon polyps     2008- BENIGN  . Left ureteral stone   . History of nephrolithiasis   . History of abnormal cervical Pap smear     1991 -- 1995  . Wears glasses   . GERD (gastroesophageal reflux disease)   . Frequency of urination   . Urgency of urination   . Microhematuria   . Hydronephrosis, left   . PONV (postoperative nausea and vomiting)   . Kidney stones 2016    Past Surgical History  Procedure Laterality Date  . Laparoscopic cholecystectomy  1999  . Tubal ligation Bilateral 1995  .  Cystoscopy w/ ureteral stent placement  02/  2000  . Extracorporeal shock wave lithotripsy  2001  &  2002  . Dx laparoscopy  X2  . Exploratory laparotomy w/ bilateral salpingectomy and removal right ectopic preg.  02-23-2004  . Total abdominal hysterectomy w/ bilateral oophorectomy and lysis adhesions  09-02-2007  . Colonoscopy w/ polypectomy  05-30-2007  . Esophagogastroduodenoscopy  05-09-2007  . Umbilical hernia repair  2003  . Shoulder arthroscopy with open rotator cuff repair Right 2012  . Cystoscopy with ureteroscopy and stent placement Left 05/18/2015    Procedure:  CYSTOSCOPY WITH URETEROSCOPY, [place];  Surgeon: Alexis Frock, MD;  Location: [facility];  Service: Urology;  Laterality: Left;  . Cystoscopy with holmium laser lithotripsy Left 05/18/2015    Procedure: CYSTOSCOPY WITH HOLMIUM LASER LITHOTRIPSY;  Surgeon: Alexis Frock, MD;  Location: [facility];  Service: Urology;  Laterality: Left;  . Cystoscopy with retrograde pyelogram, ureteroscopy and stent placement Left 06/22/2015    Procedure: CYSTOSCOPY,  LEFT URETEROSCOPY;  Surgeon: Cleon Gustin, MD;  Location: [facility];  Service: Urology;  Laterality: Left;  . Cystoscopy w/ retrogrades Bilateral 06/22/2015    Procedure: CYSTOSCOPY WITH RETROGRADE PYELOGRAM;  Surgeon: Cleon Gustin, MD;  Location: [facility];  Service: Urology;  Laterality: Bilateral;    Current Outpatient Prescriptions  Medication Sig Dispense Refill  . buPROPion (WELLBUTRIN XL) 150 MG 24 hr tablet Take 1 tablet (150 mg total) by mouth daily. (Patient taking differently: Take 150 mg by mouth at bedtime. ) 30 tablet 2  . FLUoxetine (PROZAC) 20 MG tablet Take 1 tablet (20 mg total) by mouth daily. (Patient taking differently: Take 20 mg by mouth at bedtime. ) 30 tablet 3  . oxyCODONE-acetaminophen (PERCOCET) 7.5-325 MG per tablet Take 1 tablet by mouth every 4 (four) hours as needed for moderate pain or severe pain. Post-operatively 15 tablet 0  . Potassium Citrate (UROCIT-K 15) 15 MEQ (1620 MG) TBCR Take 1 tablet by mouth 2 (two) times daily.     No current facility-administered medications for this visit.     ALLERGIES: Desvenlafaxine and Iohexol  Family History  Problem Relation Age of Onset  . Hypertension Mother   . Diabetes Father   . Cancer Paternal Uncle     Pancreas  . Heart attack Maternal Grandmother   . Drug abuse Sister   . Colon cancer Neg Hx   . Esophageal cancer Neg Hx   . Stomach cancer Neg Hx   .  Rectal cancer Neg Hx     History   Social History  . Marital Status: Married    Spouse Name: N/A  . Number of Children: N/A  . Years of Education: N/A   Occupational History  . Not on file.   Social History Main Topics  . Smoking status: Former Smoker -- 15 years    Types: Cigarettes    Quit date: 05/16/2008  . Smokeless tobacco: Never Used  . Alcohol Use: No  . Drug Use: No  . Sexual Activity:    Partners: Male    Birth Control/ Protection: Surgical     Comment: Hyst   Other Topics Concern  . Not on file   Social History Narrative    ROS:  Pertinent items are noted in HPI.  PHYSICAL EXAMINATION:    BP 110/76 mmHg  Pulse 80  Ht 5' 5.5" (1.664 m)  Wt 219 lb 9.6 oz (99.61 kg)  BMI 35.97 kg/m2    General appearance: alert, cooperative and appears stated age  ASSESSMENT  Genuine stress incontinence.  Cystocele.  Mild vault prolapse. Right hydronephrosis.  J hook of right ureter.  Hx nephrolithiasis.   PLAN   I have had a comprehensive discussion with the patient regarding prolapse and urinary incontinence.  I have provided reading materials from ACOG regarding prolapse and incontinence in general as well as medical and surgical treatment for these conditions. Medical treatments may include physical therapy, pessary use.  We discussed multiple route of approach to surgery including: - vaginal approach with anterior and posterior colporrhaphy with possible sacrospinous fixation using native tissue repair or augmentation with bovine or porcine graft, and TVT midurethral sling and cystoscopy.   We discussed benefits and risks of surgery which include but are not limited to bleeding, infection, damage to surrounding organs, ureteral damage, vaginal pain with intercourse, permanent mesh use which may cause erosion and exposure in the vagina, urethra, bladder or ureters, dyspareunia, slower voiding and urinary retention, possible need for prolonged catheterization  and/or self catheterization, de novo overactive bladder symptoms, reoperation, recurrence of prolapse and incontinence,  DVT, PE, death, and reaction to anesthesia.    I have discussed surgical expectations regarding the procedures and success rates, outcomes, and recovery.      I would like to review her urologic issues further to do safe surgical planning.  It may be wise to have urology present, and I believe the patient may need intraoperative stent placement.   An After Visit Summary was printed and given to the patient.  _25_____ minutes face to face time of which over 50% was spent in counseling.

## 2015-07-26 ENCOUNTER — Other Ambulatory Visit: Payer: Self-pay | Admitting: Internal Medicine

## 2015-07-26 DIAGNOSIS — R41840 Attention and concentration deficit: Secondary | ICD-10-CM

## 2015-07-27 ENCOUNTER — Encounter: Payer: Self-pay | Admitting: Internal Medicine

## 2015-07-27 ENCOUNTER — Other Ambulatory Visit: Payer: Self-pay | Admitting: Internal Medicine

## 2015-07-27 ENCOUNTER — Telehealth: Payer: Self-pay | Admitting: Obstetrics and Gynecology

## 2015-07-27 DIAGNOSIS — F411 Generalized anxiety disorder: Secondary | ICD-10-CM

## 2015-07-27 MED ORDER — FLUOXETINE HCL 20 MG PO TABS: 20.0000 mg | ORAL_TABLET | Freq: Every day | ORAL | Status: DC

## 2015-07-27 NOTE — Telephone Encounter (Signed)
Reviewed with Dr Quincy Simmonds. Call to patient. Advised Dr Quincy Simmonds recommends proceeding with appointment with Dr Matilde Sprang as scheduled.  If he recommends a procedure, may be able to have combined surgical case. We would be happy to assist in coordinating care. Will fax urodynamics report and Dr Elza Rafter last office note for appointment tomorrow.P Patient will call me back after this appointment.    Routing to provider for final review. Patient agreeable to disposition. Will close encounter.

## 2015-07-27 NOTE — Telephone Encounter (Signed)
Patient is waiting to hear from Dr. Quincy Simmonds about her appointment with the Urologist. She is scheduled for Friday and wants to know if she needs to cancel or not.

## 2015-07-27 NOTE — Telephone Encounter (Signed)
Routing to Dr.Silva for review. Patient was seen 07/25/2015 for consultation regarding Urodynamics testing.

## 2015-07-28 ENCOUNTER — Telehealth: Payer: Self-pay | Admitting: Obstetrics and Gynecology

## 2015-07-28 NOTE — Telephone Encounter (Signed)
Thank you. Encounter closed. 

## 2015-07-28 NOTE — Telephone Encounter (Signed)
ORJ:GYLU at [facility] calling requesting records for referral. Patient is at there office now and records have not been sent for referral. I faxed records directly to [facility] at [phone].

## 2015-08-01 ENCOUNTER — Encounter: Payer: Self-pay | Admitting: Obstetrics and Gynecology

## 2015-08-01 ENCOUNTER — Telehealth: Payer: Self-pay | Admitting: *Deleted

## 2015-08-01 NOTE — Telephone Encounter (Signed)
Call to patient to discuss surgery date options. Patient anxious to proceed as soon a possible. Advised first available date is August 16, 2015. Patient is interested to proceed with this date.  Advised will begin precertification and call her back with scheduling information.

## 2015-08-01 NOTE — Telephone Encounter (Signed)
Call to patient. Per ROI, can leave detailed message on [phone]. Left message that 08-16-15 may not be available as initially discussed. Next available is 08-23-15. Chart has been sent to precert and continuing to work on date.

## 2015-08-02 ENCOUNTER — Other Ambulatory Visit: Payer: Self-pay | Admitting: Internal Medicine

## 2015-08-04 ENCOUNTER — Encounter: Payer: Self-pay | Admitting: Obstetrics and Gynecology

## 2015-08-04 ENCOUNTER — Ambulatory Visit (INDEPENDENT_AMBULATORY_CARE_PROVIDER_SITE_OTHER): Payer: BLUE CROSS/BLUE SHIELD | Admitting: Obstetrics and Gynecology

## 2015-08-04 VITALS — BP 112/62 | HR 76 | Ht 65.5 in | Wt 218.6 lb

## 2015-08-04 DIAGNOSIS — N8110 Cystocele, unspecified: Secondary | ICD-10-CM

## 2015-08-04 DIAGNOSIS — IMO0002 Reserved for concepts with insufficient information to code with codable children: Secondary | ICD-10-CM

## 2015-08-04 DIAGNOSIS — N993 Prolapse of vaginal vault after hysterectomy: Secondary | ICD-10-CM

## 2015-08-04 DIAGNOSIS — N393 Stress incontinence (female) (male): Secondary | ICD-10-CM | POA: Diagnosis not present

## 2015-08-04 NOTE — Progress Notes (Signed)
Surgery scheduled for 08-16-15 at 0730 at [facility]. Surgery instruction sheet reviewed with patient and printed copy given. See scanned copy.

## 2015-08-04 NOTE — Progress Notes (Signed)
GYNECOLOGY  VISIT   HPI: 49 y.o.   Married  Caucasian  female   [phone] with No LMP recorded. Patient has had a hysterectomy.   here for surgery consult. Desires surgery for prolapse and incontinence of urine.   Leaks urine with cough, sneeze, or laughs.  Does not leak for no reason.  No fecal incontinence or constipation.  By prior examination, patient has third degree cystocele, first degree vaginal vault prolapse, and minimal rectocele.   Multichannel urodynamic testing done 07/20/15:  Uroflow - void 750 cc with PVR 250 cc. Flow not recorded. CMG - S1 266 cc, S2 366 cc, S3 655 cc, S4 772 Max capacity 767.5. CMG with a lot of artifact. Does not look like true detrusor instability. LPP - 71 cm H20. UPP - 25 cm H20 and 35 H20. Pressure flow - PDet Max 78 cm H20. Voided volume 575 cc.   Recent urologic surgeries:  Had surgery on 05/18/15 for left ureteral stone and refractory flank pain - Dr. Carrie Mew 1. Cystoscopy with left retrograde pyelogram interpretation. 2. Left ureteroscopy with laser lithotripsy. 3. Insertion of left ureteral stent 5 x 24 Polaris with tether.  Had surgery again on 06/22/15 for right hydronephrosis - Dr. Alyson Ingles 1. Cystoscopy 2. Right retrograde pyelography 3. Intraoperative fluoroscopy, under one hour, with interpretation 4. Right diagnostic ureteroscopy   States that she has a hook in her right ureter.   Dr. Noah Delaine referred patient to see Dr. Matilde Sprang for prolapse and incontinence.  Dr. Matilde Sprang stating that she does not have any significant risk of ureteral compression due to her anatomy.  The ureteral alteration is expected due to her prolapse.  Obligatory right ureteral stent placement was deemed not necessary by Dr. Matilde Sprang.    He offered intraop assistance if needed based on cystoscopy findings at that time.   Denies any rectal/perineal splinting.   Has an active lifestyle and wants to get back to exercise, Zumba.    Has am umbilical hernia with mesh placement.   GYNECOLOGIC HISTORY: No LMP recorded. Patient has had a hysterectomy. Contraception: Hysterectomy Menopausal hormone therapy: None Last mammogram: [facility]/15 [facility] Last pap smear: Hysterectomy        OB History    Gravida Para Term Preterm AB TAB SAB Ectopic Multiple Living   3 2 2  1   1  2          Patient Active Problem List   Diagnosis Date Noted  . Hydronephrosis, right 07/25/2015  . Adjustment disorder with anxious mood 11/01/2014  . DYSTHYMIC DISORDER 05/25/2009  . ACNE VULGARIS, FACIAL 05/25/2009  . SEBACEOUS CYST, INFECTED 04/26/2009  . DERMATITIS, ATOPIC 12/29/2008  . RESTLESS LEG SYNDROME 09/06/2008  . TRANSIENT DISORDER INITIATING/MAINTAINING SLEEP 07/23/2008  . COLONIC POLYPS, HYPERPLASTIC 05/15/2008  . HEMORRHOIDS, INTERNAL 05/15/2008  . UMBILICAL HERNIA 67/34/1937  . MIGRAINE HEADACHE 02/26/2008  . NEPHROLITHIASIS, HX OF 09/29/2007    Past Medical History  Diagnosis Date  . Anxiety   . Depression   . History of colon polyps     2008- BENIGN  . Left ureteral stone   . History of nephrolithiasis   . History of abnormal cervical Pap smear     1991 -- 1995  . Wears glasses   . GERD (gastroesophageal reflux disease)   . Frequency of urination   . Urgency of urination   . Microhematuria   . Hydronephrosis, left   . PONV (postoperative nausea and vomiting)   . Kidney stones 2016  Past Surgical History  Procedure Laterality Date  . Laparoscopic cholecystectomy  1999  . Tubal ligation Bilateral 1995  . Cystoscopy w/ ureteral stent placement  02/  2000  . Extracorporeal shock wave lithotripsy  2001  &  2002  . Dx laparoscopy  X2  . Exploratory laparotomy w/ bilateral salpingectomy and removal right ectopic preg.  02-23-2004  . Total abdominal hysterectomy w/ bilateral oophorectomy and lysis adhesions  09-02-2007  . Colonoscopy w/ polypectomy  05-30-2007  . Esophagogastroduodenoscopy   05-09-2007  . Umbilical hernia repair  2003  . Shoulder arthroscopy with open rotator cuff repair Right 2012  . Cystoscopy with ureteroscopy and stent placement Left 05/18/2015    Procedure: CYSTOSCOPY WITH URETEROSCOPY, [place];  Surgeon: Alexis Frock, MD;  Location: [facility];  Service: Urology;  Laterality: Left;  . Cystoscopy with holmium laser lithotripsy Left 05/18/2015    Procedure: CYSTOSCOPY WITH HOLMIUM LASER LITHOTRIPSY;  Surgeon: Alexis Frock, MD;  Location: [facility];  Service: Urology;  Laterality: Left;  . Cystoscopy with retrograde pyelogram, ureteroscopy and stent placement Left 06/22/2015    Procedure: CYSTOSCOPY,  LEFT URETEROSCOPY;  Surgeon: Cleon Gustin, MD;  Location: [facility];  Service: Urology;  Laterality: Left;  . Cystoscopy w/ retrogrades Bilateral 06/22/2015    Procedure: CYSTOSCOPY WITH RETROGRADE PYELOGRAM;  Surgeon: Cleon Gustin, MD;  Location[facility];  Service: Urology;  Laterality: Bilateral;    Current Outpatient Prescriptions  Medication Sig Dispense Refill  . buPROPion (WELLBUTRIN XL) 150 MG 24 hr tablet TAKE 1 TABLET (150 MG TOTAL) BY MOUTH DAILY. 30 tablet 3  . FLUoxetine (PROZAC) 20 MG tablet Take 1 tablet (20 mg total) by mouth at bedtime. 30 tablet 3  . oxyCODONE-acetaminophen (PERCOCET) 7.5-325 MG per tablet Take 1 tablet by mouth every 4 (four) hours as needed for moderate pain or severe pain. Post-operatively 15 tablet 0  . Potassium Citrate (UROCIT-K 15) 15 MEQ (1620 MG) TBCR Take 1 tablet by mouth 2 (two) times daily.     No current facility-administered medications for this visit.     ALLERGIES: Desvenlafaxine and Iohexol  Family History  Problem Relation Age of Onset  . Hypertension Mother   . Diabetes Father   . Cancer Paternal Uncle     Pancreas  . Heart attack Maternal Grandmother   . Drug abuse Sister   . Colon cancer  Neg Hx   . Esophageal cancer Neg Hx   . Stomach cancer Neg Hx   . Rectal cancer Neg Hx     Social History   Social History  . Marital Status: Married    Spouse Name: N/A  . Number of Children: N/A  . Years of Education: N/A   Occupational History  . Not on file.   Social History Main Topics  . Smoking status: Former Smoker -- 15 years    Types: Cigarettes    Quit date: 05/16/2008  . Smokeless tobacco: Never Used  . Alcohol Use: No  . Drug Use: No  . Sexual Activity:    Partners: Male    Birth Control/ Protection: Surgical     Comment: Hyst   Other Topics Concern  . Not on file   Social History Narrative    ROS:  Pertinent items are noted in HPI.  PHYSICAL EXAMINATION:    There were no vitals taken for this visit.    General appearance: alert, cooperative and appears stated age Head:  Normocephalic, without obvious abnormality, atraumatic Neck: no adenopathy, supple, symmetrical, trachea midline and thyroid normal to inspection and palpation Lungs: clear to auscultation bilaterally Heart: regular rate and rhythm Abdomen: soft, non-tender; bowel sounds normal; no masses,  no organomegaly Extremities: extremities normal, atraumatic, no cyanosis or edema Skin: Skin color, texture, turgor normal. No rashes or lesions Lymph nodes: Cervical, supraclavicular, and axillary nodes normal. No abnormal inguinal nodes palpated Neurologic: Grossly normal  Pelvic: External genitalia:  no lesions              Urethra:  normal appearing urethra with no masses, tenderness or lesions              Bartholins and Skenes: normal                 Vagina: normal appearing vagina with normal color and discharge, no lesions.  Third degree cystocele, first degree vaginal vault prolapse, minimal rectocele.                Cervix: absent           Bimanual Exam:  Uterus:  uterus absent              Adnexa: no mass, fullness, tenderness              Rectovaginal: Yes.  .  Confirms.               Anus:  normal sphincter tone, no lesions  Chaperone was present for exam.  ASSESSMENT  Status post TAH/bilateral oophorectomy.  Status post bilateral salpingectomy and treatment of ectopic pregnancy.  Incomplete vaginal prolapse.  Genuine stress incontinence.  Status post laser lithotripsy of left ureteral stone with left ureteral stent placement.  History of right hydronephrosis and right ureteral J hook. Umbilical hernia mesh.   PLAN  Counseled regarding prolapse and incontinence.   Counseled regarding surgical care with anterior colporrhapy with possible Xenform biological graft taken back to the level of the ischial spines for vault support, possible posterior colporrhaphy, TVT Exact midurethral sling, cystoscopy.    We discussed benefits and risks of surgery which include but are not limited to bleeding, infection, damage to surrounding organs, ureteral damage, vaginal pain with intercourse, permanent mesh and biological graft use which may cause erosion and exposure in the vagina, urethra, bladder or ureters, dyspareunia, slower voiding and urinary retention, possible need for prolonged catheterization and/or self catheterization, de novo overactive bladder symptoms, reoperation, recurrence of prolapse and incontinence,  DVT, PE, death, and reaction to anesthesia.    I have discussed surgical expectations regarding the procedures and success rates, outcomes, and recovery.     Patient wishes to proceed.  An After Visit Summary was printed and given to the patient.  __25____ minutes face to face time of which over 50% was spent in counseling.

## 2015-08-05 NOTE — Telephone Encounter (Signed)
See next encounter.   Routing to provider for final review.  Will close encounter.

## 2015-08-15 ENCOUNTER — Encounter (HOSPITAL_COMMUNITY)
Admission: RE | Admit: 2015-08-15 | Discharge: 2015-08-15 | Disposition: A | Discharge status: Home / Self Care | Payer: BLUE CROSS/BLUE SHIELD | Source: Ambulatory Visit | Attending: Obstetrics and Gynecology | Admitting: Obstetrics and Gynecology

## 2015-08-15 ENCOUNTER — Encounter (HOSPITAL_COMMUNITY): Payer: Self-pay

## 2015-08-15 DIAGNOSIS — F329 Major depressive disorder, single episode, unspecified: Secondary | ICD-10-CM | POA: Diagnosis not present

## 2015-08-15 DIAGNOSIS — N8110 Cystocele, unspecified: Secondary | ICD-10-CM | POA: Diagnosis not present

## 2015-08-15 DIAGNOSIS — F419 Anxiety disorder, unspecified: Secondary | ICD-10-CM | POA: Diagnosis not present

## 2015-08-15 DIAGNOSIS — G2581 Restless legs syndrome: Secondary | ICD-10-CM | POA: Diagnosis not present

## 2015-08-15 DIAGNOSIS — Z79899 Other long term (current) drug therapy: Secondary | ICD-10-CM | POA: Diagnosis not present

## 2015-08-15 DIAGNOSIS — Z87891 Personal history of nicotine dependence: Secondary | ICD-10-CM | POA: Diagnosis not present

## 2015-08-15 DIAGNOSIS — K219 Gastro-esophageal reflux disease without esophagitis: Secondary | ICD-10-CM | POA: Diagnosis not present

## 2015-08-15 DIAGNOSIS — F4322 Adjustment disorder with anxiety: Secondary | ICD-10-CM | POA: Diagnosis not present

## 2015-08-15 DIAGNOSIS — Z90710 Acquired absence of both cervix and uterus: Secondary | ICD-10-CM | POA: Diagnosis not present

## 2015-08-15 DIAGNOSIS — N393 Stress incontinence (female) (male): Secondary | ICD-10-CM | POA: Diagnosis not present

## 2015-08-15 DIAGNOSIS — G43909 Migraine, unspecified, not intractable, without status migrainosus: Secondary | ICD-10-CM | POA: Diagnosis not present

## 2015-08-15 DIAGNOSIS — M955 Acquired deformity of pelvis: Secondary | ICD-10-CM | POA: Diagnosis not present

## 2015-08-15 LAB — BASIC METABOLIC PANEL
Anion gap: 9 (ref 5–15)
BUN: 16 mg/dL (ref 6–20)
CO2: 28 mmol/L (ref 22–32)
Calcium: 9.6 mg/dL (ref 8.9–10.3)
Chloride: 102 mmol/L (ref 101–111)
Creatinine, Ser: 0.76 mg/dL (ref 0.44–1.00)
GFR calc Af Amer: >60 mL/min (ref >60)
GFR calc non Af Amer: >60 mL/min (ref >60)
Glucose, Bld: 97 mg/dL (ref 65–99)
Potassium: 4.1 mmol/L (ref 3.5–5.1)
Sodium: 139 mmol/L (ref 135–145)

## 2015-08-15 LAB — CBC
HCT: 39.2 % (ref 36.0–46.0)
Hemoglobin: 13.3 g/dL (ref 12.0–15.0)
MCH: 29.4 pg (ref 26.0–34.0)
MCHC: 33.9 g/dL (ref 30.0–36.0)
MCV: 86.7 fL (ref 78.0–100.0)
Platelets: 239 10*3/uL (ref 150–400)
RBC: 4.52 MIL/uL (ref 3.87–5.11)
RDW: 13.1 % (ref 11.5–15.5)
WBC: 6.5 10*3/uL (ref 4.0–10.5)

## 2015-08-15 MED ORDER — METRONIDAZOLE IN NACL 5-0.79 MG/ML-% IV SOLN
500.0000 mg | INTRAVENOUS | Status: AC
  Administered 2015-08-16: 500 mg via INTRAVENOUS
  Filled 2015-08-15: qty 100

## 2015-08-15 MED ORDER — CIPROFLOXACIN IN D5W 400 MG/200ML IV SOLN
400.0000 mg | INTRAVENOUS | Status: AC
  Administered 2015-08-16: 400 mg via INTRAVENOUS
  Filled 2015-08-15: qty 200

## 2015-08-15 NOTE — Patient Instructions (Addendum)
Your procedure is scheduled on: August 16, 2015    Enter through the Main Entrance of [facility] at:  6:00 am   Pick up the phone at the desk and dial [phone].  Call this number if you have problems the morning of surgery: [phone].  Remember: Do NOT eat food:  After midnight tonight  Do NOT drink clear liquids after:  Midnight tonight  Take these medicines the morning of surgery with a SIP OF WATER: none   Do NOT wear jewelry (body piercing), metal hair clips/bobby pins, or nail polish. Do NOT wear lotions, powders, or perfumes.  You may wear deoderant. Do NOT shave for 48 hours prior to surgery. Do NOT bring valuables to the hospital. Contacts, dentures, or bridgework may not be worn into surgery. Leave suitcase in car.  After surgery it may be brought to your room.  For patients admitted to the hospital, checkout time is 11:00 AM the day of discharge.

## 2015-08-15 NOTE — H&P (Signed)
Brittany Cobbs, MD at 08/04/2015 3:08 PM     Status: Signed       Expand All Collapse All   GYNECOLOGY VISIT  HPI: 49 y.o. Married Caucasian female  [phone] with No LMP recorded. Patient has had a hysterectomy.  here for surgery consult. Desires surgery for prolapse and incontinence of urine.   Leaks urine with cough, sneeze, or laughs.  Does not leak for no reason.  No fecal incontinence or constipation.  By prior examination, patient has third degree cystocele, first degree vaginal vault prolapse, and minimal rectocele.   Multichannel urodynamic testing done 07/20/15:  Uroflow - void 750 cc with PVR 250 cc. Flow not recorded. CMG - S1 266 cc, S2 366 cc, S3 655 cc, S4 772 Max capacity 767.5. CMG with a lot of artifact. Does not look like true detrusor instability. LPP - 71 cm H20. UPP - 25 cm H20 and 35 H20. Pressure flow - PDet Max 78 cm H20. Voided volume 575 cc.   Recent urologic surgeries:  Had surgery on 05/18/15 for left ureteral stone and refractory flank pain - Dr. Carrie Mew 1. Cystoscopy with left retrograde pyelogram interpretation. 2. Left ureteroscopy with laser lithotripsy. 3. Insertion of left ureteral stent 5 x 24 Polaris with tether.  Had surgery again on 06/22/15 for right hydronephrosis - Dr. Alyson Ingles 1. Cystoscopy 2. Right retrograde pyelography 3. Intraoperative fluoroscopy, under one hour, with interpretation 4. Right diagnostic ureteroscopy   States that she has a hook in her right ureter.   Dr. Noah Delaine referred patient to see Dr. Matilde Sprang for prolapse and incontinence.  Dr. Matilde Sprang stating that she does not have any significant risk of ureteral compression due to her anatomy.  The ureteral alteration is expected due to her prolapse.  Obligatory right ureteral stent placement was deemed not necessary by Dr. Matilde Sprang.   He offered intraop assistance if needed based on cystoscopy findings at that time.    Denies any rectal/perineal splinting.   Has an active lifestyle and wants to get back to exercise, Zumba.   Has am umbilical hernia with mesh placement.   GYNECOLOGIC HISTORY: No LMP recorded. Patient has had a hysterectomy. Contraception: Hysterectomy Menopausal hormone therapy: None Last mammogram: [facility]/15 [facility] Last pap smear: Hysterectomy   OB History    Gravida Para Term Preterm AB TAB SAB Ectopic Multiple Living   3 2 2  1   1  2        Patient Active Problem List   Diagnosis Date Noted  . Hydronephrosis, right 07/25/2015  . Adjustment disorder with anxious mood 11/01/2014  . DYSTHYMIC DISORDER 05/25/2009  . ACNE VULGARIS, FACIAL 05/25/2009  . SEBACEOUS CYST, INFECTED 04/26/2009  . DERMATITIS, ATOPIC 12/29/2008  . RESTLESS LEG SYNDROME 09/06/2008  . TRANSIENT DISORDER INITIATING/MAINTAINING SLEEP 07/23/2008  . COLONIC POLYPS, HYPERPLASTIC 05/15/2008  . HEMORRHOIDS, INTERNAL 05/15/2008  . UMBILICAL HERNIA 66/44/0347  . MIGRAINE HEADACHE 02/26/2008  . NEPHROLITHIASIS, HX OF 09/29/2007    Past Medical History  Diagnosis Date  . Anxiety   . Depression   . History of colon polyps     2008- BENIGN  . Left ureteral stone   . History of nephrolithiasis   . History of abnormal cervical Pap smear     1991 -- 1995  . Wears glasses   . GERD (gastroesophageal reflux disease)   . Frequency of urination   . Urgency of urination   . Microhematuria   . Hydronephrosis, left   .  PONV (postoperative nausea and vomiting)   . Kidney stones 2016    Past Surgical History  Procedure Laterality Date  . Laparoscopic cholecystectomy  1999  . Tubal ligation Bilateral 1995  . Cystoscopy w/ ureteral stent placement  02/ 2000  . Extracorporeal shock wave lithotripsy  2001 & 2002  . Dx laparoscopy  X2  .  Exploratory laparotomy w/ bilateral salpingectomy and removal right ectopic preg.  02-23-2004  . Total abdominal hysterectomy w/ bilateral oophorectomy and lysis adhesions  09-02-2007  . Colonoscopy w/ polypectomy  05-30-2007  . Esophagogastroduodenoscopy  05-09-2007  . Umbilical hernia repair  2003  . Shoulder arthroscopy with open rotator cuff repair Right 2012  . Cystoscopy with ureteroscopy and stent placement Left 05/18/2015    Procedure: CYSTOSCOPY WITH URETEROSCOPY, [place]; Surgeon: Alexis Frock, MD; Location: [facility]; Service: Urology; Laterality: Left;  . Cystoscopy with holmium laser lithotripsy Left 05/18/2015    Procedure: CYSTOSCOPY WITH HOLMIUM LASER LITHOTRIPSY; Surgeon: Alexis Frock, MD; Location: [facility]; Service: Urology; Laterality: Left;  . Cystoscopy with retrograde pyelogram, ureteroscopy and stent placement Left 06/22/2015    Procedure: CYSTOSCOPY, LEFT URETEROSCOPY; Surgeon: Cleon Gustin, MD; Location: [facility]; Service: Urology; Laterality: Left;  . Cystoscopy w/ retrogrades Bilateral 06/22/2015    Procedure: CYSTOSCOPY WITH RETROGRADE PYELOGRAM; Surgeon: Cleon Gustin, MD; Location: [facility]; Service: Urology; Laterality: Bilateral;    Current Outpatient Prescriptions  Medication Sig Dispense Refill  . buPROPion (WELLBUTRIN XL) 150 MG 24 hr tablet TAKE 1 TABLET (150 MG TOTAL) BY MOUTH DAILY. 30 tablet 3  . FLUoxetine (PROZAC) 20 MG tablet Take 1 tablet (20 mg total) by mouth at bedtime. 30 tablet 3  . oxyCODONE-acetaminophen (PERCOCET) 7.5-325 MG per tablet Take 1 tablet by mouth every 4 (four) hours as needed for moderate pain or severe pain. Post-operatively 15 tablet 0  . Potassium Citrate (UROCIT-K 15) 15 MEQ (1620 MG) TBCR Take 1 tablet by mouth 2 (two) times  daily.     No current facility-administered medications for this visit.     ALLERGIES: Desvenlafaxine and Iohexol  Family History  Problem Relation Age of Onset  . Hypertension Mother   . Diabetes Father   . Cancer Paternal Uncle     Pancreas  . Heart attack Maternal Grandmother   . Drug abuse Sister   . Colon cancer Neg Hx   . Esophageal cancer Neg Hx   . Stomach cancer Neg Hx   . Rectal cancer Neg Hx     Social History   Social History  . Marital Status: Married    Spouse Name: N/A  . Number of Children: N/A  . Years of Education: N/A   Occupational History  . Not on file.   Social History Main Topics  . Smoking status: Former Smoker -- 15 years    Types: Cigarettes    Quit date: 05/16/2008  . Smokeless tobacco: Never Used  . Alcohol Use: No  . Drug Use: No  . Sexual Activity:    Partners: Male    Birth Control/ Protection: Surgical     Comment: Hyst   Other Topics Concern  . Not on file   Social History Narrative    ROS: Pertinent items are noted in HPI.  PHYSICAL EXAMINATION:   There were no vitals taken for this visit.  General appearance: alert, cooperative and appears stated age Head: Normocephalic, without obvious abnormality, atraumatic Neck: no adenopathy, supple, symmetrical,  trachea midline and thyroid normal to inspection and palpation Lungs: clear to auscultation bilaterally Heart: regular rate and rhythm Abdomen: soft, non-tender; bowel sounds normal; no masses, no organomegaly Extremities: extremities normal, atraumatic, no cyanosis or edema Skin: Skin color, texture, turgor normal. No rashes or lesions Lymph nodes: Cervical, supraclavicular, and axillary nodes normal. No abnormal inguinal nodes palpated Neurologic: Grossly normal  Pelvic: External genitalia: no lesions  Urethra: normal  appearing urethra with no masses, tenderness or lesions  Bartholins and Skenes: normal   Vagina: normal appearing vagina with normal color and discharge, no lesions. Third degree cystocele, first degree vaginal vault prolapse, minimal rectocele.   Cervix: absent   Bimanual Exam: Uterus: uterus absent  Adnexa: no mass, fullness, tenderness  Rectovaginal: Yes. . Confirms.  Anus: normal sphincter tone, no lesions  Chaperone was present for exam.  ASSESSMENT  Status post TAH/bilateral oophorectomy.  Status post bilateral salpingectomy and treatment of ectopic pregnancy.  Incomplete vaginal prolapse.  Genuine stress incontinence.  Status post laser lithotripsy of left ureteral stone with left ureteral stent placement.  History of right hydronephrosis and right ureteral J hook. Umbilical hernia mesh.   PLAN  Counseled regarding prolapse and incontinence.   Counseled regarding surgical care with anterior colporrhapy with possible Xenform biological graft taken back to the level of the ischial spines for vault support, possible posterior colporrhaphy, TVT Exact midurethral sling, cystoscopy.   We discussed benefits and risks of surgery which include but are not limited to bleeding, infection, damage to surrounding organs, ureteral damage, vaginal pain with intercourse, permanent mesh and biological graft use which may cause erosion and exposure in the vagina, urethra, bladder or ureters, dyspareunia, slower voiding and urinary retention, possible need for prolonged catheterization and/or self catheterization, de novo overactive bladder symptoms, reoperation, recurrence of prolapse and incontinence, DVT, PE, death, and reaction to anesthesia.   I have discussed surgical expectations regarding the procedures and success rates, outcomes, and recovery.   Patient wishes to proceed.  An After  Visit Summary was printed and given to the patient.  __25____ minutes face to face time of which over 50% was spent in counseling.

## 2015-08-16 ENCOUNTER — Observation Stay (HOSPITAL_COMMUNITY)
Admission: RE | Admit: 2015-08-16 | Discharge: 2015-08-17 | Disposition: A | Discharge status: Home / Self Care | Payer: BLUE CROSS/BLUE SHIELD | Source: Ambulatory Visit | Attending: Obstetrics and Gynecology | Admitting: Obstetrics and Gynecology

## 2015-08-16 ENCOUNTER — Encounter (HOSPITAL_COMMUNITY)
Admission: RE | Disposition: A | Discharge status: Home / Self Care | Payer: Self-pay | Source: Ambulatory Visit | Attending: Obstetrics and Gynecology

## 2015-08-16 ENCOUNTER — Ambulatory Visit (HOSPITAL_COMMUNITY): Payer: BLUE CROSS/BLUE SHIELD | Admitting: Anesthesiology

## 2015-08-16 ENCOUNTER — Encounter (HOSPITAL_COMMUNITY): Payer: Self-pay | Admitting: *Deleted

## 2015-08-16 DIAGNOSIS — Z9889 Other specified postprocedural states: Secondary | ICD-10-CM

## 2015-08-16 DIAGNOSIS — N8110 Cystocele, unspecified: Secondary | ICD-10-CM | POA: Diagnosis not present

## 2015-08-16 DIAGNOSIS — G2581 Restless legs syndrome: Secondary | ICD-10-CM | POA: Insufficient documentation

## 2015-08-16 DIAGNOSIS — N393 Stress incontinence (female) (male): Secondary | ICD-10-CM | POA: Insufficient documentation

## 2015-08-16 DIAGNOSIS — F419 Anxiety disorder, unspecified: Secondary | ICD-10-CM | POA: Insufficient documentation

## 2015-08-16 DIAGNOSIS — G43909 Migraine, unspecified, not intractable, without status migrainosus: Secondary | ICD-10-CM | POA: Insufficient documentation

## 2015-08-16 DIAGNOSIS — K219 Gastro-esophageal reflux disease without esophagitis: Secondary | ICD-10-CM | POA: Insufficient documentation

## 2015-08-16 DIAGNOSIS — Z87891 Personal history of nicotine dependence: Secondary | ICD-10-CM | POA: Insufficient documentation

## 2015-08-16 DIAGNOSIS — F4322 Adjustment disorder with anxiety: Secondary | ICD-10-CM | POA: Insufficient documentation

## 2015-08-16 DIAGNOSIS — F329 Major depressive disorder, single episode, unspecified: Secondary | ICD-10-CM | POA: Insufficient documentation

## 2015-08-16 DIAGNOSIS — N993 Prolapse of vaginal vault after hysterectomy: Secondary | ICD-10-CM | POA: Diagnosis not present

## 2015-08-16 DIAGNOSIS — M955 Acquired deformity of pelvis: Secondary | ICD-10-CM | POA: Insufficient documentation

## 2015-08-16 DIAGNOSIS — Z79899 Other long term (current) drug therapy: Secondary | ICD-10-CM | POA: Insufficient documentation

## 2015-08-16 DIAGNOSIS — Z90710 Acquired absence of both cervix and uterus: Secondary | ICD-10-CM | POA: Insufficient documentation

## 2015-08-16 HISTORY — PX: ANTERIOR AND POSTERIOR REPAIR WITH SACROSPINOUS FIXATION: SHX6536

## 2015-08-16 HISTORY — PX: BLADDER SUSPENSION: SHX72

## 2015-08-16 HISTORY — PX: CYSTOSCOPY: SHX5120

## 2015-08-16 SURGERY — ANTERIOR AND POSTERIOR REPAIR WITH SACROSPINOUS FIXATION
Anesthesia: General | Site: Vagina

## 2015-08-16 MED ORDER — ONDANSETRON HCL 4 MG/2ML IJ SOLN: 4.0000 mg | Freq: Four times a day (QID) | INTRAMUSCULAR | Status: DC | PRN

## 2015-08-16 MED ORDER — HYDROMORPHONE 0.3 MG/ML IV SOLN
INTRAVENOUS | Status: DC
  Administered 2015-08-16: 4.2 mg via INTRAVENOUS
  Administered 2015-08-16: 16:00:00 via INTRAVENOUS
  Administered 2015-08-17 (×2): 1.5 mg via INTRAVENOUS
  Filled 2015-08-16: qty 25

## 2015-08-16 MED ORDER — ESTRADIOL 0.1 MG/GM VA CREA
TOPICAL_CREAM | VAGINAL | Status: AC
  Filled 2015-08-16: qty 42.5

## 2015-08-16 MED ORDER — HYDROMORPHONE HCL 1 MG/ML IJ SOLN
0.2500 mg | INTRAMUSCULAR | Status: DC | PRN
  Administered 2015-08-16 (×4): 0.5 mg via INTRAVENOUS

## 2015-08-16 MED ORDER — SODIUM CHLORIDE 0.9 % IJ SOLN: 9.0000 mL | INTRAMUSCULAR | Status: DC | PRN

## 2015-08-16 MED ORDER — PHENYLEPHRINE 40 MCG/ML (10ML) SYRINGE FOR IV PUSH (FOR BLOOD PRESSURE SUPPORT)
PREFILLED_SYRINGE | INTRAVENOUS | Status: AC
  Filled 2015-08-16: qty 10

## 2015-08-16 MED ORDER — MEPERIDINE HCL 25 MG/ML IJ SOLN: 6.2500 mg | INTRAMUSCULAR | Status: DC | PRN

## 2015-08-16 MED ORDER — POTASSIUM CITRATE ER 15 MEQ (1620 MG) PO TBCR: 1.0000 | EXTENDED_RELEASE_TABLET | Freq: Two times a day (BID) | ORAL | Status: DC

## 2015-08-16 MED ORDER — ONDANSETRON HCL 4 MG/2ML IJ SOLN
INTRAMUSCULAR | Status: AC
  Filled 2015-08-16: qty 2

## 2015-08-16 MED ORDER — BUPROPION HCL ER (XL) 150 MG PO TB24
150.0000 mg | ORAL_TABLET | Freq: Every day | ORAL | Status: DC
  Administered 2015-08-16: 150 mg via ORAL
  Filled 2015-08-16 (×2): qty 1

## 2015-08-16 MED ORDER — ONDANSETRON HCL 4 MG/2ML IJ SOLN
INTRAMUSCULAR | Status: DC | PRN
  Administered 2015-08-16: 4 mg via INTRAVENOUS

## 2015-08-16 MED ORDER — STERILE WATER FOR IRRIGATION IR SOLN
Status: DC | PRN
Start: 2015-08-16 — End: 2015-08-16
  Administered 2015-08-16: 1000 mL via INTRAVESICAL

## 2015-08-16 MED ORDER — ESTRADIOL 0.1 MG/GM VA CREA
TOPICAL_CREAM | VAGINAL | Status: DC | PRN
  Administered 2015-08-16: 1 via VAGINAL

## 2015-08-16 MED ORDER — METHYLENE BLUE 1 % INJ SOLN
INTRAMUSCULAR | Status: AC
  Filled 2015-08-16: qty 1

## 2015-08-16 MED ORDER — PHENYLEPHRINE HCL 10 MG/ML IJ SOLN
INTRAMUSCULAR | Status: DC | PRN
  Administered 2015-08-16: 40 ug via INTRAVENOUS

## 2015-08-16 MED ORDER — LACTATED RINGERS IV SOLN
INTRAVENOUS | Status: DC
  Administered 2015-08-16: 07:00:00 via INTRAVENOUS

## 2015-08-16 MED ORDER — DIPHENHYDRAMINE HCL 12.5 MG/5ML PO ELIX: 12.5000 mg | ORAL_SOLUTION | Freq: Four times a day (QID) | ORAL | Status: DC | PRN

## 2015-08-16 MED ORDER — PROPOFOL 10 MG/ML IV BOLUS
INTRAVENOUS | Status: DC | PRN
  Administered 2015-08-16: 200 mg via INTRAVENOUS

## 2015-08-16 MED ORDER — SCOPOLAMINE 1 MG/3DAYS TD PT72
1.0000 | MEDICATED_PATCH | Freq: Once | TRANSDERMAL | Status: DC
  Administered 2015-08-16: 1.5 mg via TRANSDERMAL

## 2015-08-16 MED ORDER — LIDOCAINE HCL (CARDIAC) 20 MG/ML IV SOLN
INTRAVENOUS | Status: DC | PRN
  Administered 2015-08-16: 100 mg via INTRAVENOUS

## 2015-08-16 MED ORDER — SCOPOLAMINE 1 MG/3DAYS TD PT72
MEDICATED_PATCH | TRANSDERMAL | Status: AC
  Administered 2015-08-16: 1.5 mg via TRANSDERMAL
  Filled 2015-08-16: qty 1

## 2015-08-16 MED ORDER — PROPOFOL 10 MG/ML IV BOLUS
INTRAVENOUS | Status: AC
  Filled 2015-08-16: qty 20

## 2015-08-16 MED ORDER — DEXAMETHASONE SODIUM PHOSPHATE 10 MG/ML IJ SOLN
INTRAMUSCULAR | Status: AC
  Filled 2015-08-16: qty 1

## 2015-08-16 MED ORDER — FENTANYL CITRATE (PF) 250 MCG/5ML IJ SOLN
INTRAMUSCULAR | Status: AC
  Filled 2015-08-16: qty 25

## 2015-08-16 MED ORDER — FENTANYL CITRATE (PF) 100 MCG/2ML IJ SOLN
INTRAMUSCULAR | Status: AC
  Filled 2015-08-16: qty 4

## 2015-08-16 MED ORDER — GLYCOPYRROLATE 0.2 MG/ML IJ SOLN
INTRAMUSCULAR | Status: DC | PRN
  Administered 2015-08-16 (×2): 0.1 mg via INTRAVENOUS

## 2015-08-16 MED ORDER — EPHEDRINE 5 MG/ML INJ
INTRAVENOUS | Status: AC
  Filled 2015-08-16: qty 10

## 2015-08-16 MED ORDER — MIDAZOLAM HCL 2 MG/2ML IJ SOLN
INTRAMUSCULAR | Status: DC | PRN
  Administered 2015-08-16: 2 mg via INTRAVENOUS

## 2015-08-16 MED ORDER — DIPHENHYDRAMINE HCL 50 MG/ML IJ SOLN: 12.5000 mg | Freq: Four times a day (QID) | INTRAMUSCULAR | Status: DC | PRN

## 2015-08-16 MED ORDER — HYDROMORPHONE HCL 1 MG/ML IJ SOLN
INTRAMUSCULAR | Status: AC
  Administered 2015-08-16: 0.5 mg via INTRAVENOUS
  Filled 2015-08-16: qty 1

## 2015-08-16 MED ORDER — MORPHINE SULFATE 1 MG/ML IV SOLN
INTRAVENOUS | Status: DC
  Administered 2015-08-16: 13:00:00 via INTRAVENOUS
  Filled 2015-08-16: qty 25

## 2015-08-16 MED ORDER — KETOROLAC TROMETHAMINE 30 MG/ML IJ SOLN
30.0000 mg | Freq: Four times a day (QID) | INTRAMUSCULAR | Status: DC
  Administered 2015-08-16 – 2015-08-17 (×3): 30 mg via INTRAVENOUS
  Filled 2015-08-16 (×3): qty 1

## 2015-08-16 MED ORDER — LIDOCAINE-EPINEPHRINE 1 %-1:100000 IJ SOLN
INTRAMUSCULAR | Status: DC | PRN
  Administered 2015-08-16: 10 mL

## 2015-08-16 MED ORDER — FLUOXETINE HCL 20 MG PO CAPS
20.0000 mg | ORAL_CAPSULE | Freq: Every day | ORAL | Status: DC
  Administered 2015-08-16: 20 mg via ORAL
  Filled 2015-08-16 (×2): qty 1

## 2015-08-16 MED ORDER — LACTATED RINGERS IV SOLN
INTRAVENOUS | Status: DC
  Administered 2015-08-16 – 2015-08-17 (×2): via INTRAVENOUS

## 2015-08-16 MED ORDER — OXYCODONE-ACETAMINOPHEN 5-325 MG PO TABS
1.0000 | ORAL_TABLET | ORAL | Status: DC | PRN
  Administered 2015-08-17: 1 via ORAL
  Filled 2015-08-16: qty 1

## 2015-08-16 MED ORDER — KETOROLAC TROMETHAMINE 30 MG/ML IJ SOLN: 30.0000 mg | Freq: Once | INTRAMUSCULAR | Status: DC | PRN

## 2015-08-16 MED ORDER — LIDOCAINE HCL (CARDIAC) 20 MG/ML IV SOLN
INTRAVENOUS | Status: AC
  Filled 2015-08-16: qty 5

## 2015-08-16 MED ORDER — DEXAMETHASONE SODIUM PHOSPHATE 4 MG/ML IJ SOLN
INTRAMUSCULAR | Status: AC
  Filled 2015-08-16: qty 1

## 2015-08-16 MED ORDER — FENTANYL CITRATE (PF) 100 MCG/2ML IJ SOLN
INTRAMUSCULAR | Status: AC
  Filled 2015-08-16: qty 2

## 2015-08-16 MED ORDER — BUPROPION HCL ER (XL) 150 MG PO TB24
150.0000 mg | ORAL_TABLET | Freq: Every day | ORAL | Status: DC
  Filled 2015-08-16 (×2): qty 1

## 2015-08-16 MED ORDER — SODIUM CHLORIDE 0.9 % IR SOLN
Freq: Once | Status: AC
  Administered 2015-08-16: 500 mL
  Filled 2015-08-16: qty 1

## 2015-08-16 MED ORDER — LACTATED RINGERS IV SOLN
INTRAVENOUS | Status: DC
  Administered 2015-08-16: 125 mL/h via INTRAVENOUS
  Administered 2015-08-16 (×2): via INTRAVENOUS

## 2015-08-16 MED ORDER — MENTHOL 3 MG MT LOZG: 1.0000 | LOZENGE | OROMUCOSAL | Status: DC | PRN

## 2015-08-16 MED ORDER — KETOROLAC TROMETHAMINE 30 MG/ML IJ SOLN
INTRAMUSCULAR | Status: AC
  Filled 2015-08-16: qty 1

## 2015-08-16 MED ORDER — LIDOCAINE-EPINEPHRINE 1 %-1:100000 IJ SOLN
INTRAMUSCULAR | Status: AC
  Filled 2015-08-16: qty 1

## 2015-08-16 MED ORDER — EPHEDRINE SULFATE 50 MG/ML IJ SOLN
INTRAMUSCULAR | Status: DC | PRN
  Administered 2015-08-16 (×3): 10 mg via INTRAVENOUS

## 2015-08-16 MED ORDER — PROMETHAZINE HCL 25 MG/ML IJ SOLN: 6.2500 mg | INTRAMUSCULAR | Status: DC | PRN

## 2015-08-16 MED ORDER — NALOXONE HCL 0.4 MG/ML IJ SOLN: 0.4000 mg | INTRAMUSCULAR | Status: DC | PRN

## 2015-08-16 MED ORDER — IBUPROFEN 600 MG PO TABS
600.0000 mg | ORAL_TABLET | Freq: Four times a day (QID) | ORAL | Status: DC | PRN
  Administered 2015-08-17: 600 mg via ORAL
  Filled 2015-08-16: qty 1

## 2015-08-16 MED ORDER — FENTANYL CITRATE (PF) 100 MCG/2ML IJ SOLN
INTRAMUSCULAR | Status: DC | PRN
  Administered 2015-08-16 (×9): 50 ug via INTRAVENOUS

## 2015-08-16 MED ORDER — KETOROLAC TROMETHAMINE 30 MG/ML IJ SOLN
INTRAMUSCULAR | Status: DC | PRN
  Administered 2015-08-16: 30 mg via INTRAVENOUS

## 2015-08-16 MED ORDER — PHENAZOPYRIDINE HCL 100 MG PO TABS
100.0000 mg | ORAL_TABLET | Freq: Three times a day (TID) | ORAL | Status: DC
  Administered 2015-08-16: 100 mg via ORAL
  Filled 2015-08-16 (×6): qty 1

## 2015-08-16 MED ORDER — MIDAZOLAM HCL 2 MG/2ML IJ SOLN
INTRAMUSCULAR | Status: AC
Start: 2015-08-16 — End: 2015-08-16
  Filled 2015-08-16: qty 4

## 2015-08-16 MED ORDER — DEXAMETHASONE SODIUM PHOSPHATE 10 MG/ML IJ SOLN
INTRAMUSCULAR | Status: DC | PRN
  Administered 2015-08-16: 8 mg via INTRAVENOUS

## 2015-08-16 MED ORDER — ONDANSETRON HCL 4 MG PO TABS: 4.0000 mg | ORAL_TABLET | Freq: Four times a day (QID) | ORAL | Status: DC | PRN

## 2015-08-16 SURGICAL SUPPLY — 37 items
BLADE SURG 11 STRL SS (BLADE) ×3 IMPLANT
CANISTER SUCT 3000ML (MISCELLANEOUS) ×3 IMPLANT
CATH FOLEY 2WAY SLVR  5CC 18FR (CATHETERS) ×1
CATH FOLEY 2WAY SLVR 5CC 18FR (CATHETERS) ×2 IMPLANT
CLOTH BEACON ORANGE TIMEOUT ST (SAFETY) ×3 IMPLANT
DECANTER SPIKE VIAL GLASS SM (MISCELLANEOUS) ×6 IMPLANT
DEVICE CAPIO SLIM SINGLE (INSTRUMENTS) ×3 IMPLANT
GAUZE PACKING 2X5 YD STRL (GAUZE/BANDAGES/DRESSINGS) ×3 IMPLANT
GAUZE SPONGE 4X4 16PLY XRAY LF (GAUZE/BANDAGES/DRESSINGS) ×6 IMPLANT
GLOVE BIO SURGEON STRL SZ 6.5 (GLOVE) ×3 IMPLANT
GLOVE BIOGEL PI IND STRL 6.5 (GLOVE) ×2 IMPLANT
GLOVE BIOGEL PI INDICATOR 6.5 (GLOVE) ×1
GOWN STRL REUS W/TWL LRG LVL3 (GOWN DISPOSABLE) ×12 IMPLANT
LIQUID BAND (GAUZE/BANDAGES/DRESSINGS) ×3 IMPLANT
NEEDLE HYPO 22GX1.5 SAFETY (NEEDLE) ×3 IMPLANT
NEEDLE MAYO 6 CRC TAPER PT (NEEDLE) ×3 IMPLANT
NS IRRIG 1000ML POUR BTL (IV SOLUTION) ×3 IMPLANT
PACK VAGINAL MINOR WOMEN LF (CUSTOM PROCEDURE TRAY) ×3 IMPLANT
PACK VAGINAL WOMENS (CUSTOM PROCEDURE TRAY) ×3 IMPLANT
PAD MAGNETIC INST (MISCELLANEOUS) ×3 IMPLANT
PLUG CATH AND CAP STER (CATHETERS) ×3 IMPLANT
RETRACTOR STAY HOOK 5MM (MISCELLANEOUS) ×3 IMPLANT
SET CYSTO W/LG BORE CLAMP LF (SET/KITS/TRAYS/PACK) ×3 IMPLANT
SLING TVT EXACT (Sling) ×3 IMPLANT
SUT CAPIO ETHIBPND (SUTURE) ×18 IMPLANT
SUT VIC AB 0 CT1 27 (SUTURE) ×3
SUT VIC AB 0 CT1 27XBRD ANBCTR (SUTURE) ×6 IMPLANT
SUT VIC AB 2-0 CT2 27 (SUTURE) ×24 IMPLANT
SUT VIC AB 2-0 SH 27 (SUTURE) ×4
SUT VIC AB 2-0 SH 27XBRD (SUTURE) ×8 IMPLANT
SUT VIC AB 2-0 UR6 27 (SUTURE) ×3 IMPLANT
TISSUE REPAIR XENFORM 6X10CM (Tissue) ×3 IMPLANT
TOWEL OR 17X24 6PK STRL BLUE (TOWEL DISPOSABLE) ×6 IMPLANT
TRAY FOLEY BAG SILVER LF 16FR (SET/KITS/TRAYS/PACK) ×3 IMPLANT
TRAY FOLEY CATH SILVER 14FR (SET/KITS/TRAYS/PACK) ×3 IMPLANT
TUBING NON-CON 1/4 X 20 CONN (TUBING) ×3 IMPLANT
WATER STERILE IRR 1000ML POUR (IV SOLUTION) ×3 IMPLANT

## 2015-08-16 NOTE — Progress Notes (Signed)
Update to History and Physical  No marked change in status since office preop visit.  Took Pyridium 100 mg this am.   Patient examined.   OK to proceed with surgery.

## 2015-08-16 NOTE — Brief Op Note (Signed)
08/16/2015  10:18 AM  PATIENT:  [name]  49 y.o. female  PRE-OPERATIVE DIAGNOSIS:  cystocele, vaginal vault prolapse, genuine stress incontinence   POST-OPERATIVE DIAGNOSIS:  cystocele, vaginal vault prolapse, genuine stress incontinence   PROCEDURE:  Procedure(s) with comments: ANTERIOR COLPORRHAPHY WITH XENOFORM GRAFT AND SACROSPINOUS FIXATION (N/A) - 2.5 hours OR time TRANSVAGINAL TAPE (TVT) PROCEDURE exact midurethral sling (N/A) CYSTOSCOPY (N/A)  SURGEON:  Surgeon(s) and Role:    * Dereon Williamsen E Yisroel Ramming, MD - Primary    * Salvadore Dom, MD - Assisting  PHYSICIAN ASSISTANT: NA  ASSISTANTS: Salvadore Dom, MD   ANESTHESIA:   local and general  EBL:  Total I/O In: 1800 [I.V.:1800] Out: 400 [Urine:200; Blood:200]  BLOOD ADMINISTERED:none  DRAINS: Urinary Catheter (Foley)   LOCAL MEDICATIONS USED:  LIDOCAINE   SPECIMEN:   No specimen.   DISPOSITION OF SPECIMEN:  N/A  COUNTS:  YES  TOURNIQUET:  * No tourniquets in log *  DICTATION: .Other Dictation: Dictation Number    PLAN OF CARE: Admit for overnight observation  PATIENT DISPOSITION:  PACU - hemodynamically stable.   Delay start of Pharmacological VTE agent (>24hrs) due to surgical blood loss or risk of bleeding: not applicable

## 2015-08-16 NOTE — Progress Notes (Signed)
Day of Surgery Procedure(s) (LRB): ANTERIOR COLPORRHAPHY WITH XENOFORM GRAFT AND SACROSPINOUS FIXATION (N/A) TRANSVAGINAL TAPE (TVT) PROCEDURE exact midurethral sling (N/A) CYSTOSCOPY (N/A)  Subjective: Patient reports pelvic pain. Some right suprapubic and right vulvar discomfort.  Morphine not working as well as Dilaudid did in the PACU.  Will try clear liquids.  Not out of bed yet.  No nausea.  Objective: I have reviewed patient's vital signs and intake and output. T 98.2, BP 131/69, P 95, RR 16.  UO - 2200 cc IV/525 cc.   General: alert and cooperative Resp: clear to auscultation bilaterally Cardio: regular rate and rhythm, S1, S2 normal, no murmur, click, rub or gallop GI: soft, non-tender; bowel sounds normal; no masses,  no organomegaly and incision: clean, dry and intact Extremities: PAS and Ted hose on.  DPs 2+ bilaterally.  Vaginal Bleeding: none  Assessment: s/p Procedure(s) with comments: ANTERIOR COLPORRHAPHY WITH XENOFORM GRAFT AND SACROSPINOUS FIXATION (N/A) - 2.5 hours OR time TRANSVAGINAL TAPE (TVT) PROCEDURE exact midurethral sling (N/A) CYSTOSCOPY (N/A): stable  Plan: Advance diet Continue foley due to post op state.  Will switch to Dilaudid PCA.  CBC and BMP in the am.  Out of bed this afternoon.  Surgical findings and procedure discussed with the patient.  Questions answered.       [name] 08/16/2015, 3:15 PM

## 2015-08-16 NOTE — Anesthesia Preprocedure Evaluation (Signed)
Anesthesia Evaluation  Patient identified by MRN, date of birth, ID band Patient awake    Reviewed: Allergy & Precautions, H&P , NPO status , Patient's Chart, lab work & pertinent test results  Airway Mallampati: II  TM Distance: >3 FB Neck ROM: full    Dental no notable dental hx. (+) Teeth Intact   Pulmonary former smoker,    Pulmonary exam normal       Cardiovascular negative cardio ROS Normal cardiovascular exam    Neuro/Psych    GI/Hepatic Neg liver ROS, GERD-  Medicated and Controlled,  Endo/Other  negative endocrine ROS  Renal/GU      Musculoskeletal   Abdominal (+) + obese,   Peds  Hematology negative hematology ROS (+)   Anesthesia Other Findings   Reproductive/Obstetrics negative OB ROS                             Anesthesia Physical Anesthesia Plan  ASA: II  Anesthesia Plan: General   Post-op Pain Management:    Induction: Intravenous  Airway Management Planned: LMA  Additional Equipment:   Intra-op Plan:   Post-operative Plan:   Informed Consent: I have reviewed the patients History and Physical, chart, labs and discussed the procedure including the risks, benefits and alternatives for the proposed anesthesia with the patient or authorized representative who has indicated his/her understanding and acceptance.   Dental Advisory Given  Plan Discussed with: CRNA and Surgeon  Anesthesia Plan Comments:         Anesthesia Quick Evaluation

## 2015-08-16 NOTE — Transfer of Care (Signed)
Immediate Anesthesia Transfer of Care Note  Patient: [name]  Procedure(s) Performed: Procedure(s) with comments: ANTERIOR COLPORRHAPHY WITH XENOFORM GRAFT AND SACROSPINOUS FIXATION (N/A) - 2.5 hours OR time TRANSVAGINAL TAPE (TVT) PROCEDURE exact midurethral sling (N/A) CYSTOSCOPY (N/A)  Patient Location: PACU  Anesthesia Type:General  Level of Consciousness: awake, alert , oriented and patient cooperative  Airway & Oxygen Therapy: Patient Spontanous Breathing and Patient connected to nasal cannula oxygen  Post-op Assessment: Report given to RN and Post -op Vital signs reviewed and stable  Post vital signs: Reviewed and stable  Last Vitals:  Filed Vitals:   08/16/15 0615  BP: 136/69  Pulse: 88  Temp: 36.3 C  Resp: 20    Complications: No apparent anesthesia complications

## 2015-08-16 NOTE — Anesthesia Procedure Notes (Signed)
Procedure Name: LMA Insertion Date/Time: 08/16/2015 7:31 AM Performed by: Georgeanne Nim Pre-anesthesia Checklist: Patient identified, Emergency Drugs available, Suction available, Patient being monitored and Timeout performed Patient Re-evaluated:Patient Re-evaluated prior to inductionOxygen Delivery Method: Circle system utilized Preoxygenation: Pre-oxygenation with 100% oxygen Intubation Type: IV induction Ventilation: Mask ventilation without difficulty LMA: LMA with gastric port inserted LMA Size: 4.0 Placement Confirmation: positive ETCO2,  CO2 detector and breath sounds checked- equal and bilateral Tube secured with: Tape Dental Injury: Teeth and Oropharynx as per pre-operative assessment

## 2015-08-16 NOTE — Anesthesia Postprocedure Evaluation (Signed)
Anesthesia Post Note  Patient: [name]  Procedure(s) Performed: Procedure(s) (LRB): ANTERIOR COLPORRHAPHY WITH XENOFORM GRAFT AND SACROSPINOUS FIXATION (N/A) TRANSVAGINAL TAPE (TVT) PROCEDURE exact midurethral sling (N/A) CYSTOSCOPY (N/A)  Anesthesia type: General  Patient location: PACU  Post pain: Pain level controlled  Post assessment: Post-op Vital signs reviewed  Last Vitals:  Filed Vitals:   08/16/15 1202  BP: 114/69  Pulse: 99  Temp: 36.9 C  Resp: 12    Post vital signs: Reviewed  Level of consciousness: sedated  Complications: No apparent anesthesia complications

## 2015-08-17 ENCOUNTER — Encounter (HOSPITAL_COMMUNITY): Payer: Self-pay | Admitting: Obstetrics and Gynecology

## 2015-08-17 ENCOUNTER — Other Ambulatory Visit: Payer: Self-pay | Admitting: Obstetrics and Gynecology

## 2015-08-17 DIAGNOSIS — N8110 Cystocele, unspecified: Secondary | ICD-10-CM | POA: Diagnosis not present

## 2015-08-17 LAB — BASIC METABOLIC PANEL
Anion gap: 4 — ABNORMAL LOW (ref 5–15)
BUN: 13 mg/dL (ref 6–20)
CO2: 32 mmol/L (ref 22–32)
Calcium: 8.7 mg/dL — ABNORMAL LOW (ref 8.9–10.3)
Chloride: 104 mmol/L (ref 101–111)
Creatinine, Ser: 0.81 mg/dL (ref 0.44–1.00)
GFR calc Af Amer: >60 mL/min (ref >60)
GFR calc non Af Amer: >60 mL/min (ref >60)
Glucose, Bld: 114 mg/dL — ABNORMAL HIGH (ref 65–99)
Potassium: 4.5 mmol/L (ref 3.5–5.1)
Sodium: 140 mmol/L (ref 135–145)

## 2015-08-17 LAB — CBC
HCT: 33.6 % — ABNORMAL LOW (ref 36.0–46.0)
Hemoglobin: 11 g/dL — ABNORMAL LOW (ref 12.0–15.0)
MCH: 28.9 pg (ref 26.0–34.0)
MCHC: 32.7 g/dL (ref 30.0–36.0)
MCV: 88.4 fL (ref 78.0–100.0)
Platelets: 219 10*3/uL (ref 150–400)
RBC: 3.8 MIL/uL — ABNORMAL LOW (ref 3.87–5.11)
RDW: 13.4 % (ref 11.5–15.5)
WBC: 11.4 10*3/uL — ABNORMAL HIGH (ref 4.0–10.5)

## 2015-08-17 MED ORDER — HYDROMORPHONE HCL 2 MG PO TABS: 2.0000 mg | ORAL_TABLET | ORAL | Status: DC | PRN

## 2015-08-17 MED ORDER — HYDROMORPHONE HCL 2 MG PO TABS
2.0000 mg | ORAL_TABLET | ORAL | Status: DC | PRN
  Administered 2015-08-17: 2 mg via ORAL
  Filled 2015-08-17 (×2): qty 1

## 2015-08-17 MED ORDER — CIPROFLOXACIN HCL 250 MG PO TABS: 250.0000 mg | ORAL_TABLET | Freq: Two times a day (BID) | ORAL | Status: DC

## 2015-08-17 MED ORDER — IBUPROFEN 600 MG PO TABS: 600.0000 mg | ORAL_TABLET | Freq: Four times a day (QID) | ORAL | Status: DC | PRN

## 2015-08-17 NOTE — Progress Notes (Signed)
Pt is discharged in the care of husband. Foley catheter in pace and draining well. Pt. Understands all instructions well. Questions asked and answered. Downstairs per wheelchair.Denies pain or discomfort.

## 2015-08-17 NOTE — Progress Notes (Signed)
Vaginal packing removed as ordered. Minimal serosanguineous drainage noted. Packing intact. Patient tolerated well.

## 2015-08-17 NOTE — Progress Notes (Signed)
Inserted #14 french foley catheter as ordered Pt. Tolerated procedure well. 700cc of amber colored urine expressed; Pt teaching was done. Repeat demonstrations on emptying foley and care was given to pt .Comphrended well. Questions asked and answered.

## 2015-08-17 NOTE — Progress Notes (Signed)
GYN Post op Day 1 Addendum  Patient unable to void.  Bladder ultrasound for 100 - 150 cc.  PVR 500 cc.  Foley will be replaced and patient sent home with a leg bag.  Rx to her pharmacy for ciprofloxacin 250 mg po bid for up to one week.  Has post op check for next week.  Cath removal them.

## 2015-08-17 NOTE — Addendum Note (Signed)
Addendum  created 08/17/15 0753 by Jonna Munro, CRNA   Modules edited: Notes Section   Notes Section:  File: [id number]

## 2015-08-17 NOTE — Progress Notes (Signed)
Pt is still complaining of having a lot of pressure around incisional  Bladder area.  Dr notified of pt.'s status. Orders were received and carried.

## 2015-08-17 NOTE — Progress Notes (Signed)
1 Day Post-Op Procedure(s) (LRB): ANTERIOR COLPORRHAPHY WITH XENOFORM GRAFT AND SACROSPINOUS FIXATION (N/A) TRANSVAGINAL TAPE (TVT) PROCEDURE exact midurethral sling (N/A) CYSTOSCOPY (N/A)  Subjective: Patient reports good pain control with dilaudid.  Now on Percocet po.  Ambulating without problems.  No void yet since catheter out.  Vaginal packing out.  Objective: I have reviewed patient's vital signs, intake and output and labs. T 98.2, BP 110/72, P 69, RR 18. WBC 11.4 Hgb 11.0   General: alert and cooperative Resp: clear to auscultation bilaterally Cardio: regular rate and rhythm, S1, S2 normal, no murmur, click, rub or gallop GI: soft, non-tender; bowel sounds normal; no masses,  no organomegaly and incision: clean, dry, intact and suprapubic ecchymoses. Vaginal Bleeding: none  Assessment: s/p Procedure(s) with comments: ANTERIOR COLPORRHAPHY WITH XENOFORM GRAFT AND SACROSPINOUS FIXATION (N/A) - 2.5 hours OR time TRANSVAGINAL TAPE (TVT) PROCEDURE exact midurethral sling (N/A) CYSTOSCOPY (N/A): progressing well  Plan: Advance diet Encourage ambulation Advance to PO medication Discharge home  Bladder training.  Instructions and precautions given.  Rx for Dilaudid and Motrin po.  Surgical findings and procedure reviewed.      Deundra Bard A Quincy Simmonds 08/17/2015, 8:04 AM

## 2015-08-17 NOTE — Discharge Instructions (Signed)
Urethral Vaginal Sling, Care After Refer to this sheet in the next few weeks. These instructions provide you with information on caring for yourself after your procedure. Your health care provider may also give you more specific instructions. Your treatment has been planned according to current medical practices, but problems sometimes occur. Call your health care provider if you have any problems or questions after your procedure.  WHAT TO EXPECT AFTER THE PROCEDURE  After your procedure, it is typical to have the following:  A catheter in your bladder until your bladder is able to work on its own properly. You will be instructed on how to empty the catheter bag.  Absorbable stitches in your incisions. They will slowly dissolve over 1-2 months. HOME CARE INSTRUCTIONS  Get plenty of rest.  Only take over-the-counter or prescription medicines as directed by your health care provider. Do not take aspirin because it can cause bleeding.  Do not take baths. Take showers until your health care provider tells you otherwise.  You may resume your usual diet. Eat a well-balanced diet.  Drink enough fluids to keep your urine clear or pale yellow.  Limit exercise and activities as directed by your health care provider. Do not lift anything heavier than 5 pounds (2.3 kg).  Do not douche, use tampons, or have sexual intercourse for 6 weeks after your procedure.  Follow up with your health care provider as directed. SEEK MEDICAL CARE IF:  You have a heavy or bad smelling vaginal discharge.   You have a rash.   You have pain that is not controlled with medicines.   You have lightheadedness or feel faint.  SEEK IMMEDIATE MEDICAL CARE IF:  You have a fever.   You have vaginal bleeding.   You faint.   You have shortness of breath.   You have chest, abdominal, or leg pain.   You have pain when urinating or cannot urinate.   Your catheter is still in your bladder and becomes  blocked.   You have swelling, redness, and pain in the vaginal area.  Document Released: 09/30/2013 Document Reviewed: 09/30/2013 [facility] Patient Information 2015 [place], [place]. This information is not intended to replace advice given to you by your health care provider. Make sure you discuss any questions you have with your health care provider.  Cystocele Repair, Care After Refer to this sheet in the next few weeks. These instructions provide you with information on caring for yourself after your procedure. Your health care provider may also give you more specific instructions. Your treatment has been planned according to current medical practices, but problems sometimes occur. Call your health care provider if you have any problems or questions after your procedure. WHAT TO EXPECT AFTER THE PROCEDURE After your procedure, it is typical to have the following:  Bloody discharge from the vagina for 1-2 weeks.  A catheter in your bladder to drain urine as the bladder heals. You will be instructed on how to empty the bag. HOME CARE INSTRUCTIONS   Only take over-the-counter or prescription medicines as directed by your health care provider.  Do not take baths. Take showers until your health care provider tells you otherwise.  Exercise as instructed. Do not perform any exercise that increases the pressure on your abdomen, such as lifting weights or doing sit-ups, until your health care provider has given you permission.  You may resume your normal diet. Eat a well-balanced diet.  Drink enough fluids to keep your urine clear or pale yellow.  Avoid straining during bowel movements. If you become constipated, you may:  Take a mild laxative if your health care provider approves.  Add fruit and bran to your diet.  Drink more fluids.  Do not douche or have sexual intercourse for 6 weeks after your surgery.  Follow up with your health care provider as directed. SEEK MEDICAL CARE  IF:  You have nausea or vomiting.  You have vaginal pain that is not relieved by pain medicines.  You feel a burning sensation during urination or have frequent urination.  SEEK IMMEDIATE MEDICAL CARE IF:   You have redness, swelling, or a bad-smelling discharge from the vagina.  You notice a bad smell coming from the vagina.  You have pus coming from the vagina.  You have a fever.  You have abdominal pain.  You have excessive vaginal bleeding.  You have shortness of breath or chest pain. MAKE SURE YOU:  Understand these instructions.  Will watch your condition.  Will get help right away if you are not doing well or get worse. Document Released: 06/29/2005 Document Revised: 12/15/2013 Document Reviewed: 05/29/2013 [facility] Patient Information 2015 [place], [place]. This information is not intended to replace advice given to you by your health care provider. Make sure you discuss any questions you have with your health care provider.

## 2015-08-17 NOTE — Anesthesia Postprocedure Evaluation (Signed)
  Anesthesia Post-op Note  Patient: [name]  Procedure(s) Performed: Procedure(s) with comments: ANTERIOR COLPORRHAPHY WITH XENOFORM GRAFT AND SACROSPINOUS FIXATION (N/A) - 2.5 hours OR time TRANSVAGINAL TAPE (TVT) PROCEDURE exact midurethral sling (N/A) CYSTOSCOPY (N/A)  Patient Location: [facility] Unit  Anesthesia Type:General  Level of Consciousness: awake, alert  and oriented  Airway and Oxygen Therapy: Patient Spontanous Breathing  Post-op Pain: none  Post-op Assessment: Post-op Vital signs reviewed, Patient's Cardiovascular Status Stable, Respiratory Function Stable, Patent Airway, No signs of Nausea or vomiting, Adequate PO intake and Pain level controlled              Post-op Vital Signs: Reviewed and stable  Last Vitals:  Filed Vitals:   08/17/15 0551  BP: 110/72  Pulse: 69  Temp: 36.8 C  Resp: 18    Complications: No apparent anesthesia complications

## 2015-08-17 NOTE — Op Note (Signed)
NAMEMarland Kitchen  Brittany, [name] NO.:  [id number]  MEDICAL RECORD NO.:  [id number]  LOCATION:  9303                          FACILITY:  [facility]  PHYSICIAN:  [name], [name]   DATE OF BIRTH:  [birth year]  DATE OF PROCEDURE: DATE OF DISCHARGE:                              OPERATIVE REPORT   PREOPERATIVE DIAGNOSES:  Cystocele, vaginal vault prolapse, and genuine stress incontinence.  POSTOPERATIVE DIAGNOSES:  Cystocele, vaginal vault prolapse, and genuine stress incontinence.  PROCEDURE:  Anterior colporrhaphy with Xenform graft and bilateral sacrospinous fixation, TVT Exact mid urethral sling, and cystoscopy.  SURGEON:  [name], [name]  ASSISTANT:  [name], [name]  ANESTHESIA:  General endotracheal, local with 1% lidocaine with epinephrine 1:100,000.  IV FLUIDS:  1800 mL Ringer's lactate.  EBL:  200 mL.  URINE OUTPUT:  200 mL.  COMPLICATIONS:  None.  INDICATIONS FOR THE PROCEDURE:  The patient is a 49 year old gravida 3, para 2-0-1-2, Caucasian female, status post total abdominal hysterectomy, who presents with vaginal prolapse and urinary incontinence with coughing, sneezing, and laughing.  On physical examination, the patient was noted to have a third-degree cystocele with first-degree vaginal vault prolapse and a very minimal rectocele.  The patient had multichannel urodynamic testing in the office, which confirmed the presence of genuine stress incontinence.  The patient is now wishing to proceed with surgical care for the above and our plan is made to proceed with an anterior colporrhaphy with possible Xenform graft placement and fixation of the graft to the bilateral sacrospinous ligaments, TVT Exact mid urethral sling, and cystoscopy.  Risks, benefits, and alternatives have been reviewed with the patient who wishes to proceed.  FINDINGS:  The patient was noted to have a third-degree cystocele and first-degree vaginal vault prolapse.   There was a small enterocele.  There was essentially no rectocele.  The cervix was absent.  No pelvic masses were appreciated.  Cystoscopy during and at termination of the procedure documented the ureters to be patent bilaterally.  There was no evidence of a foreign body in the bladder or the urethra.  The bladder was normal throughout 360 degrees, including the bladder dome and trigone.  The urethra was unremarkable.  There is no evidence of any suture material in the rectum at termination of the procedure.  SPECIMENS:  None.  DESCRIPTION OF PROCEDURE:  The patient was reidentified in the preoperative hold area.  For antibiotic prophylaxis, she received ciprofloxacin and Flagyl IV.  She received TED hose and PAS stockings for DVT prophylaxis.  In the operating room, the patient was placed in the dorsal lithotomy position with Petrich stirrups and she then underwent general endotracheal anesthesia.  The abdomen, vagina, and perineum were sterilely prepped and she was sterilely draped of urine.  A Foley catheter was placed inside the bladder and left to gravity drainage throughout and at the termination of the procedure.  A weighted speculum was placed inside the vagina.  Each of the bilateral vaginal apices were marked with dyed Vicryl suture.  The anterior vaginal mucosa was grasped with Allis clamps from the level of 1 cm below the urethral meatus, all the way to the  vaginal cuff.  The mucosa was injected locally with 1% lidocaine with epinephrine 1:100,000.  The vaginal mucosa was then incised vertically in the midline with the scalpel.  With a combination of sharp and blunt dissection, the subvaginal tissue and bladder were death dissected away from the vaginal mucosa bilaterally.  The dissection was carried anteriorly to the level of the pubic rami and all the way to the level of the vaginal cuff. Hemostasis was created with monopolar cautery throughout the dissection and  the procedure.  The cystocele was reduced at this time by placing vertical mattress sutures of 2-0 Vicryl.  There appeared to be an enterocele at the top of the repair.  The cul-de-sac itself appeared to have some adhesions present.  The enterocele was closed by placing pursestring sutures of 2-0 Vicryl.  A Foley catheter was removed and cystoscopy was performed at this time and the findings are as noted above.  There were good bilateral urethral jets of urine.  The bladder was drained of cystoscopic fluid and a Foley catheter was replaced.  The TVT Exact was performed.  1 cm suprapubic incisions were then created with a scalpel, 2 cm to the right and left of the midline.  The TVT Exact was performed in a bottom up fashion.  The Foley catheter was removed and the Foley catheter tip with the obturator guide were then placed in the urethra and the urethra was deflected away from the right retropubic space so that the right needle guide could be placed through the endopelvic fascia, right retropubic space, and then up through right suprapubic incision.  The Foley catheter tip and obturator guide were then deflected in the opposite direction and the needle guide for the sling was placed similarly through the patient's left-hand side.  At this time, a cystoscopy was performed and again the findings are as noted above.  There was no foreign body in the bladder or the urethra. The bladder was drained of cystoscopic fluid and the Foley catheter was replaced at this time.  The sling was drawn up through the suprapubic incisions bilaterally.  A Kelly clamp was placed between the sling and the urethra as the plastic sheaths were removed and then the excess sling was trimmed suprapubically.  The sling was noted to be in good position.  At this time, the bilateral sacrospinous sutures were placed.  The vaginal cuff was definitely noted to have some descensus.  Blunt dissection was used to dissect  through the right perirectal space, and down to the ischial spine and then cleaned off the right sacrospinous ligament.  The same was performed along the patient's left-hand side for that sacrospinous ligament.  Hemostasis was good during this procedure. The SLIM Capio with 0-Ethibond suture was then placed first in the patient's right ischial spine approximately 1 cm medial to the ischial spine.  This did require multiple attempts as the suture initially was not getting good placement into the right sacrospinous ligament.  Hemostasis was good.  The Capio device was then used to place 0 Ethibond in the left sacrospinous ligament, 1.5 cm medial to the left ischial spine.  Rectal exam was performed at this time and there was no evidence of any suture in the rectum.  At this time, anchoring sutures were placed anteriorly at the level of the white line bilaterally.  These were 2-0 Vicryl sutures.  This Xenform graft, which had been placed and bacitracin solution at this time was trimmed and the graft was brought  through the anterior and posterior corners of the Xenform graft.  The sutures were tied down to the sacrospinous ligaments first and then through the 2-0 Vicryl sutures anteriorly.  The graft was noted to lay down nicely.  The graft was tacked to the underside of the vaginal mucosa in the midline to provide some additional support of the vaginal cuff.  Hemostasis was excellent at this time.  Excess vaginal mucosa was trimmed.  The anterior vaginal wall was closed with a running locked suture of 2-0 Vicryl.  The Foley catheter was removed and final cystoscopy was performed and again the findings are as noted above.  The ureters were patent bilaterally and there was no foreign body in the bladder and urethra.  A gauze packing with Estrace cream was then placed inside the vagina.  The suprapubic incisions were closed with Dermabond.  Final rectal exam confirmed the absence of  sutures in the rectum.  This concluded the patient's procedure.  There were no complications. All needle, instrument, and sponge counts were correct.  The patient was escorted to the recovery room in stable and awake condition.      [name], [name]     BES/MEDQ  D:  08/16/2015  T:  08/17/2015  Job:  [id number]

## 2015-08-18 ENCOUNTER — Encounter (HOSPITAL_COMMUNITY): Payer: Self-pay

## 2015-08-18 ENCOUNTER — Ambulatory Visit (INDEPENDENT_AMBULATORY_CARE_PROVIDER_SITE_OTHER): Payer: BLUE CROSS/BLUE SHIELD | Admitting: Obstetrics and Gynecology

## 2015-08-18 ENCOUNTER — Telehealth: Payer: Self-pay | Admitting: Obstetrics and Gynecology

## 2015-08-18 ENCOUNTER — Inpatient Hospital Stay: Admit: 2015-08-18 | Payer: Self-pay | Admitting: Obstetrics and Gynecology

## 2015-08-18 ENCOUNTER — Inpatient Hospital Stay (HOSPITAL_COMMUNITY): Payer: BLUE CROSS/BLUE SHIELD | Admitting: Anesthesiology

## 2015-08-18 ENCOUNTER — Encounter (HOSPITAL_COMMUNITY)
Admission: AD | Disposition: A | Discharge status: Home / Self Care | Payer: Self-pay | Source: Ambulatory Visit | Attending: Obstetrics and Gynecology

## 2015-08-18 ENCOUNTER — Ambulatory Visit: Payer: BLUE CROSS/BLUE SHIELD | Admitting: Obstetrics and Gynecology

## 2015-08-18 ENCOUNTER — Encounter (HOSPITAL_COMMUNITY): Payer: Self-pay | Admitting: Certified Registered Nurse Anesthetist

## 2015-08-18 ENCOUNTER — Observation Stay (HOSPITAL_COMMUNITY)
Admission: AD | Admit: 2015-08-18 | Discharge: 2015-08-19 | Disposition: A | Discharge status: Home / Self Care | Payer: BLUE CROSS/BLUE SHIELD | Source: Ambulatory Visit | Attending: Obstetrics and Gynecology | Admitting: Obstetrics and Gynecology

## 2015-08-18 ENCOUNTER — Encounter: Payer: Self-pay | Admitting: Obstetrics and Gynecology

## 2015-08-18 VITALS — BP 130/70 | HR 84 | Temp 98.2°F | Ht 65.5 in | Wt 229.0 lb

## 2015-08-18 DIAGNOSIS — Z87891 Personal history of nicotine dependence: Secondary | ICD-10-CM | POA: Insufficient documentation

## 2015-08-18 DIAGNOSIS — T8389XA Other specified complication of genitourinary prosthetic devices, implants and grafts, initial encounter: Principal | ICD-10-CM | POA: Insufficient documentation

## 2015-08-18 DIAGNOSIS — E669 Obesity, unspecified: Secondary | ICD-10-CM | POA: Diagnosis not present

## 2015-08-18 DIAGNOSIS — Y9289 Other specified places as the place of occurrence of the external cause: Secondary | ICD-10-CM | POA: Insufficient documentation

## 2015-08-18 DIAGNOSIS — R339 Retention of urine, unspecified: Secondary | ICD-10-CM | POA: Diagnosis not present

## 2015-08-18 DIAGNOSIS — K6289 Other specified diseases of anus and rectum: Secondary | ICD-10-CM | POA: Diagnosis not present

## 2015-08-18 DIAGNOSIS — Z888 Allergy status to other drugs, medicaments and biological substances status: Secondary | ICD-10-CM | POA: Diagnosis not present

## 2015-08-18 DIAGNOSIS — G43909 Migraine, unspecified, not intractable, without status migrainosus: Secondary | ICD-10-CM | POA: Insufficient documentation

## 2015-08-18 DIAGNOSIS — Z87442 Personal history of urinary calculi: Secondary | ICD-10-CM | POA: Diagnosis not present

## 2015-08-18 DIAGNOSIS — R233 Spontaneous ecchymoses: Secondary | ICD-10-CM | POA: Diagnosis not present

## 2015-08-18 DIAGNOSIS — Z6837 Body mass index (BMI) 37.0-37.9, adult: Secondary | ICD-10-CM | POA: Insufficient documentation

## 2015-08-18 DIAGNOSIS — Z9889 Other specified postprocedural states: Secondary | ICD-10-CM

## 2015-08-18 DIAGNOSIS — Y763 Surgical instruments, materials and obstetric and gynecological devices (including sutures) associated with adverse incidents: Secondary | ICD-10-CM | POA: Insufficient documentation

## 2015-08-18 DIAGNOSIS — Z86010 Personal history of colonic polyps: Secondary | ICD-10-CM | POA: Insufficient documentation

## 2015-08-18 DIAGNOSIS — L309 Dermatitis, unspecified: Secondary | ICD-10-CM | POA: Diagnosis not present

## 2015-08-18 DIAGNOSIS — F4322 Adjustment disorder with anxiety: Secondary | ICD-10-CM | POA: Diagnosis not present

## 2015-08-18 DIAGNOSIS — K219 Gastro-esophageal reflux disease without esophagitis: Secondary | ICD-10-CM | POA: Diagnosis not present

## 2015-08-18 DIAGNOSIS — G2581 Restless legs syndrome: Secondary | ICD-10-CM | POA: Insufficient documentation

## 2015-08-18 DIAGNOSIS — F341 Dysthymic disorder: Secondary | ICD-10-CM | POA: Diagnosis not present

## 2015-08-18 DIAGNOSIS — R1031 Right lower quadrant pain: Secondary | ICD-10-CM | POA: Diagnosis not present

## 2015-08-18 HISTORY — PX: VAGINAL HYSTERECTOMY: SHX2639

## 2015-08-18 LAB — BASIC METABOLIC PANEL
Anion gap: 7 (ref 5–15)
BUN: 15 mg/dL (ref 6–20)
CO2: 29 mmol/L (ref 22–32)
Calcium: 8.9 mg/dL (ref 8.9–10.3)
Chloride: 106 mmol/L (ref 101–111)
Creatinine, Ser: 0.86 mg/dL (ref 0.44–1.00)
GFR calc Af Amer: >60 mL/min (ref >60)
GFR calc non Af Amer: >60 mL/min (ref >60)
Glucose, Bld: 95 mg/dL (ref 65–99)
Potassium: 4.1 mmol/L (ref 3.5–5.1)
Sodium: 142 mmol/L (ref 135–145)

## 2015-08-18 LAB — CBC
HCT: 35 % — ABNORMAL LOW (ref 36.0–46.0)
Hemoglobin: 11.3 g/dL — ABNORMAL LOW (ref 12.0–15.0)
MCH: 28.7 pg (ref 26.0–34.0)
MCHC: 32.3 g/dL (ref 30.0–36.0)
MCV: 88.8 fL (ref 78.0–100.0)
Platelets: 184 10*3/uL (ref 150–400)
RBC: 3.94 MIL/uL (ref 3.87–5.11)
RDW: 13.1 % (ref 11.5–15.5)
WBC: 8.5 10*3/uL (ref 4.0–10.5)

## 2015-08-18 LAB — TYPE AND SCREEN
ABO/RH(D): A POS
Antibody Screen: NEGATIVE

## 2015-08-18 SURGERY — HYSTERECTOMY, VAGINAL
Anesthesia: General | Site: Vagina

## 2015-08-18 MED ORDER — GLYCOPYRROLATE 0.2 MG/ML IJ SOLN
INTRAMUSCULAR | Status: AC
  Filled 2015-08-18: qty 1

## 2015-08-18 MED ORDER — MENTHOL 3 MG MT LOZG
1.0000 | LOZENGE | OROMUCOSAL | Status: DC | PRN
  Administered 2015-08-19: 3 mg via ORAL
  Filled 2015-08-18: qty 9

## 2015-08-18 MED ORDER — FENTANYL CITRATE (PF) 100 MCG/2ML IJ SOLN
50.0000 ug | Freq: Once | INTRAMUSCULAR | Status: AC
  Administered 2015-08-18: 50 ug via INTRAVENOUS
  Filled 2015-08-18: qty 2

## 2015-08-18 MED ORDER — ESTRADIOL 0.1 MG/GM VA CREA
TOPICAL_CREAM | VAGINAL | Status: AC
  Filled 2015-08-18: qty 42.5

## 2015-08-18 MED ORDER — PROPOFOL 10 MG/ML IV BOLUS
INTRAVENOUS | Status: DC | PRN
  Administered 2015-08-18: 200 mg via INTRAVENOUS

## 2015-08-18 MED ORDER — GELATIN ABSORBABLE 12-7 MM EX MISC
CUTANEOUS | Status: DC | PRN
  Administered 2015-08-18: 1

## 2015-08-18 MED ORDER — FENTANYL CITRATE (PF) 100 MCG/2ML IJ SOLN
25.0000 ug | INTRAMUSCULAR | Status: DC | PRN
  Administered 2015-08-18 (×2): 50 ug via INTRAVENOUS

## 2015-08-18 MED ORDER — LIDOCAINE HCL (CARDIAC) 20 MG/ML IV SOLN
INTRAVENOUS | Status: AC
  Filled 2015-08-18: qty 5

## 2015-08-18 MED ORDER — IBUPROFEN 600 MG PO TABS: 600.0000 mg | ORAL_TABLET | Freq: Four times a day (QID) | ORAL | Status: DC | PRN

## 2015-08-18 MED ORDER — ROCURONIUM BROMIDE 100 MG/10ML IV SOLN
INTRAVENOUS | Status: DC | PRN
  Administered 2015-08-18: 40 mg via INTRAVENOUS

## 2015-08-18 MED ORDER — CIPROFLOXACIN IN D5W 400 MG/200ML IV SOLN
400.0000 mg | Freq: Once | INTRAVENOUS | Status: AC
  Administered 2015-08-18: 400 mg via INTRAVENOUS
  Filled 2015-08-18: qty 200

## 2015-08-18 MED ORDER — DIPHENHYDRAMINE HCL 12.5 MG/5ML PO ELIX: 12.5000 mg | ORAL_SOLUTION | Freq: Four times a day (QID) | ORAL | Status: DC | PRN

## 2015-08-18 MED ORDER — FENTANYL CITRATE (PF) 100 MCG/2ML IJ SOLN
INTRAMUSCULAR | Status: AC
  Filled 2015-08-18: qty 2

## 2015-08-18 MED ORDER — ESTRADIOL 0.1 MG/GM VA CREA
TOPICAL_CREAM | VAGINAL | Status: DC | PRN
  Administered 2015-08-18: 1 via VAGINAL

## 2015-08-18 MED ORDER — PROMETHAZINE HCL 25 MG/ML IJ SOLN: 6.2500 mg | INTRAMUSCULAR | Status: DC | PRN

## 2015-08-18 MED ORDER — LACTATED RINGERS IV SOLN: INTRAVENOUS | Status: DC

## 2015-08-18 MED ORDER — SODIUM CHLORIDE 0.9 % IJ SOLN: 9.0000 mL | INTRAMUSCULAR | Status: DC | PRN

## 2015-08-18 MED ORDER — FENTANYL CITRATE (PF) 100 MCG/2ML IJ SOLN
50.0000 ug | Freq: Once | INTRAMUSCULAR | Status: DC | PRN
  Administered 2015-08-18: 50 ug via INTRAVENOUS
  Filled 2015-08-18: qty 2

## 2015-08-18 MED ORDER — FENTANYL CITRATE (PF) 100 MCG/2ML IJ SOLN
INTRAMUSCULAR | Status: DC | PRN
  Administered 2015-08-18 (×5): 50 ug via INTRAVENOUS

## 2015-08-18 MED ORDER — ROCURONIUM BROMIDE 100 MG/10ML IV SOLN
INTRAVENOUS | Status: AC
  Filled 2015-08-18: qty 1

## 2015-08-18 MED ORDER — POTASSIUM CITRATE ER 15 MEQ (1620 MG) PO TBCR: 1.0000 | EXTENDED_RELEASE_TABLET | Freq: Two times a day (BID) | ORAL | Status: DC

## 2015-08-18 MED ORDER — LIDOCAINE HCL (CARDIAC) 20 MG/ML IV SOLN
INTRAVENOUS | Status: DC | PRN
  Administered 2015-08-18: 80 mg via INTRAVENOUS

## 2015-08-18 MED ORDER — BUPROPION HCL ER (XL) 150 MG PO TB24
150.0000 mg | ORAL_TABLET | Freq: Every day | ORAL | Status: DC
  Administered 2015-08-18: 150 mg via ORAL
  Filled 2015-08-18 (×3): qty 1

## 2015-08-18 MED ORDER — GLYCOPYRROLATE 0.2 MG/ML IJ SOLN
INTRAMUSCULAR | Status: AC
  Filled 2015-08-18: qty 3

## 2015-08-18 MED ORDER — LACTATED RINGERS IV SOLN
INTRAVENOUS | Status: DC
  Administered 2015-08-18 – 2015-08-19 (×3): via INTRAVENOUS

## 2015-08-18 MED ORDER — PHENYLEPHRINE 40 MCG/ML (10ML) SYRINGE FOR IV PUSH (FOR BLOOD PRESSURE SUPPORT)
PREFILLED_SYRINGE | INTRAVENOUS | Status: AC
  Filled 2015-08-18: qty 10

## 2015-08-18 MED ORDER — NALOXONE HCL 0.4 MG/ML IJ SOLN: 0.4000 mg | INTRAMUSCULAR | Status: DC | PRN

## 2015-08-18 MED ORDER — ONDANSETRON HCL 4 MG PO TABS: 4.0000 mg | ORAL_TABLET | Freq: Four times a day (QID) | ORAL | Status: DC | PRN

## 2015-08-18 MED ORDER — DEXAMETHASONE SODIUM PHOSPHATE 4 MG/ML IJ SOLN
INTRAMUSCULAR | Status: AC
  Filled 2015-08-18: qty 1

## 2015-08-18 MED ORDER — KETOROLAC TROMETHAMINE 30 MG/ML IJ SOLN
30.0000 mg | Freq: Four times a day (QID) | INTRAMUSCULAR | Status: DC
  Administered 2015-08-18 – 2015-08-19 (×3): 30 mg via INTRAVENOUS
  Filled 2015-08-18 (×3): qty 1

## 2015-08-18 MED ORDER — DEXAMETHASONE SODIUM PHOSPHATE 10 MG/ML IJ SOLN
INTRAMUSCULAR | Status: DC | PRN
  Administered 2015-08-18: 4 mg via INTRAVENOUS

## 2015-08-18 MED ORDER — ONDANSETRON HCL 4 MG/2ML IJ SOLN
INTRAMUSCULAR | Status: AC
  Filled 2015-08-18: qty 2

## 2015-08-18 MED ORDER — KETOROLAC TROMETHAMINE 30 MG/ML IJ SOLN
INTRAMUSCULAR | Status: AC
  Filled 2015-08-18: qty 1

## 2015-08-18 MED ORDER — OXYCODONE HCL 5 MG PO TABS: 5.0000 mg | ORAL_TABLET | Freq: Once | ORAL | Status: DC | PRN

## 2015-08-18 MED ORDER — GLYCOPYRROLATE 0.2 MG/ML IJ SOLN
INTRAMUSCULAR | Status: DC | PRN
  Administered 2015-08-18: 0.6 mg via INTRAVENOUS
  Administered 2015-08-18: 0.2 mg via INTRAVENOUS

## 2015-08-18 MED ORDER — OXYCODONE-ACETAMINOPHEN 5-325 MG PO TABS
1.0000 | ORAL_TABLET | ORAL | Status: DC | PRN
  Administered 2015-08-19 (×2): 2 via ORAL
  Filled 2015-08-18 (×2): qty 2

## 2015-08-18 MED ORDER — OXYCODONE HCL 5 MG/5ML PO SOLN: 5.0000 mg | Freq: Once | ORAL | Status: DC | PRN

## 2015-08-18 MED ORDER — FENTANYL CITRATE (PF) 250 MCG/5ML IJ SOLN
INTRAMUSCULAR | Status: AC
  Filled 2015-08-18: qty 25

## 2015-08-18 MED ORDER — ONDANSETRON HCL 4 MG/2ML IJ SOLN: 4.0000 mg | Freq: Four times a day (QID) | INTRAMUSCULAR | Status: DC | PRN

## 2015-08-18 MED ORDER — FLUOXETINE HCL 20 MG PO TABS
20.0000 mg | ORAL_TABLET | Freq: Every day | ORAL | Status: DC
  Administered 2015-08-18: 20 mg via ORAL
  Filled 2015-08-18 (×2): qty 1

## 2015-08-18 MED ORDER — METRONIDAZOLE IN NACL 5-0.79 MG/ML-% IV SOLN
500.0000 mg | Freq: Once | INTRAVENOUS | Status: AC
  Administered 2015-08-18 (×2): 500 mg via INTRAVENOUS
  Filled 2015-08-18: qty 100

## 2015-08-18 MED ORDER — MIDAZOLAM HCL 2 MG/2ML IJ SOLN
INTRAMUSCULAR | Status: DC | PRN
  Administered 2015-08-18: 2 mg via INTRAVENOUS

## 2015-08-18 MED ORDER — KETOROLAC TROMETHAMINE 30 MG/ML IJ SOLN
INTRAMUSCULAR | Status: DC | PRN
  Administered 2015-08-18: 30 mg via INTRAVENOUS

## 2015-08-18 MED ORDER — NEOSTIGMINE METHYLSULFATE 10 MG/10ML IV SOLN
INTRAVENOUS | Status: DC | PRN
  Administered 2015-08-18: 4 mg via INTRAVENOUS

## 2015-08-18 MED ORDER — PROPOFOL 10 MG/ML IV BOLUS
INTRAVENOUS | Status: AC
  Filled 2015-08-18: qty 20

## 2015-08-18 MED ORDER — HYDROMORPHONE 0.3 MG/ML IV SOLN
INTRAVENOUS | Status: DC
  Administered 2015-08-18: 3 mg via INTRAVENOUS
  Administered 2015-08-18: 19:00:00 via INTRAVENOUS
  Administered 2015-08-19: 2.1 mg via INTRAVENOUS
  Administered 2015-08-19: 0.9 mg via INTRAVENOUS
  Filled 2015-08-18: qty 25

## 2015-08-18 MED ORDER — DIPHENHYDRAMINE HCL 50 MG/ML IJ SOLN: 12.5000 mg | Freq: Four times a day (QID) | INTRAMUSCULAR | Status: DC | PRN

## 2015-08-18 MED ORDER — ONDANSETRON HCL 4 MG/2ML IJ SOLN
INTRAMUSCULAR | Status: DC | PRN
  Administered 2015-08-18: 4 mg via INTRAVENOUS

## 2015-08-18 MED ORDER — LACTATED RINGERS IV SOLN
INTRAVENOUS | Status: DC
  Administered 2015-08-18: 20:00:00 via INTRAVENOUS

## 2015-08-18 MED ORDER — HYDROMORPHONE HCL 2 MG PO TABS: 2.0000 mg | ORAL_TABLET | ORAL | Status: DC | PRN

## 2015-08-18 MED ORDER — 0.9 % SODIUM CHLORIDE (POUR BTL) OPTIME
TOPICAL | Status: DC | PRN
  Administered 2015-08-18: 1000 mL

## 2015-08-18 MED ORDER — PHENYLEPHRINE HCL 10 MG/ML IJ SOLN
INTRAMUSCULAR | Status: DC | PRN
  Administered 2015-08-18 (×4): 40 ug via INTRAVENOUS

## 2015-08-18 MED ORDER — FAMOTIDINE IN NACL 20-0.9 MG/50ML-% IV SOLN
20.0000 mg | Freq: Once | INTRAVENOUS | Status: AC
  Administered 2015-08-18: 20 mg via INTRAVENOUS
  Filled 2015-08-18: qty 50

## 2015-08-18 MED ORDER — MIDAZOLAM HCL 2 MG/2ML IJ SOLN
INTRAMUSCULAR | Status: AC
  Filled 2015-08-18: qty 4

## 2015-08-18 SURGICAL SUPPLY — 33 items
CANISTER SUCT 3000ML (MISCELLANEOUS) ×2 IMPLANT
CLOTH BEACON ORANGE TIMEOUT ST (SAFETY) ×2 IMPLANT
CONT PATH 16OZ SNAP LID 3702 (MISCELLANEOUS) IMPLANT
CONTAINER PREFILL 10% NBF 60ML (FORM) IMPLANT
DECANTER SPIKE VIAL GLASS SM (MISCELLANEOUS) IMPLANT
GAUZE PACKING 1 X5 YD ST (GAUZE/BANDAGES/DRESSINGS) IMPLANT
GAUZE PACKING 2X5 YD STRL (GAUZE/BANDAGES/DRESSINGS) ×2 IMPLANT
GLOVE BIO SURGEON STRL SZ 6.5 (GLOVE) ×2 IMPLANT
GLOVE BIOGEL PI IND STRL 6.5 (GLOVE) ×1 IMPLANT
GLOVE BIOGEL PI INDICATOR 6.5 (GLOVE) ×1
GOWN STRL REUS W/TWL LRG LVL3 (GOWN DISPOSABLE) ×8 IMPLANT
NEEDLE HYPO 22GX1.5 SAFETY (NEEDLE) IMPLANT
NEEDLE SPNL 22GX3.5 QUINCKE BK (NEEDLE) IMPLANT
NS IRRIG 1000ML POUR BTL (IV SOLUTION) ×4 IMPLANT
PACK VAGINAL WOMENS (CUSTOM PROCEDURE TRAY) ×2 IMPLANT
PAD MAGNETIC INST (MISCELLANEOUS) IMPLANT
PAD OB MATERNITY 4.3X12.25 (PERSONAL CARE ITEMS) ×2 IMPLANT
SET CYSTO W/LG BORE CLAMP LF (SET/KITS/TRAYS/PACK) IMPLANT
SPONGE SURGIFOAM ABS GEL 12-7 (HEMOSTASIS) ×2 IMPLANT
SUT VIC AB 0 CT1 18XCR BRD8 (SUTURE) ×1 IMPLANT
SUT VIC AB 0 CT1 27 (SUTURE) ×1
SUT VIC AB 0 CT1 27XBRD ANBCTR (SUTURE) ×1 IMPLANT
SUT VIC AB 0 CT1 27XCR 8 STRN (SUTURE) IMPLANT
SUT VIC AB 0 CT1 8-18 (SUTURE) ×1
SUT VIC AB 2-0 CT2 27 (SUTURE) ×2 IMPLANT
SUT VIC AB 2-0 UR5 27 (SUTURE) IMPLANT
SUT VICRYL 0 TIES 12 18 (SUTURE) IMPLANT
SYR BULB IRRIGATION 50ML (SYRINGE) ×2 IMPLANT
SYRINGE 10CC LL (SYRINGE) IMPLANT
TOWEL OR 17X24 6PK STRL BLUE (TOWEL DISPOSABLE) ×4 IMPLANT
TRAY FOLEY CATH SILVER 16FR (SET/KITS/TRAYS/PACK) ×2 IMPLANT
TUBING NON-CON 1/4 X 20 CONN (TUBING) ×2 IMPLANT
WATER STERILE IRR 1000ML POUR (IV SOLUTION) IMPLANT

## 2015-08-18 NOTE — Telephone Encounter (Signed)
Patient returned call to office. States her neighbor is able to bring her to the office for her appointment. Appointment moved to today at 12 pm. Patient is agreeable.  Encounter previously closed. Routing to Dr.Silva as FYI.

## 2015-08-18 NOTE — Anesthesia Postprocedure Evaluation (Signed)
  Anesthesia Post-op Note  Patient: [name]  Procedure(s) Performed: Procedure(s) (LRB): Exam under Anesthesia, Excision Xenform Graft, Removal Bilateral Sacrospinous Ligament sutures (N/A)  Patient Location: PACU  Anesthesia Type: General  Level of Consciousness: awake and alert   Airway and Oxygen Therapy: Patient Spontanous Breathing  Post-op Pain: mild  Post-op Assessment: Post-op Vital signs reviewed, Patient's Cardiovascular Status Stable, Respiratory Function Stable, Patent Airway and No signs of Nausea or vomiting  Last Vitals:  Filed Vitals:   08/18/15 1828  BP: 131/63  Pulse: 72  Temp: 37.4 C  Resp: 20    Post-op Vital Signs: stable   Complications: No apparent anesthesia complications

## 2015-08-18 NOTE — Anesthesia Preprocedure Evaluation (Signed)
Anesthesia Evaluation  Patient identified by MRN, date of birth, ID band Patient awake    Reviewed: Allergy & Precautions, H&P , NPO status , Patient's Chart, lab work & pertinent test results  History of Anesthesia Complications (+) PONV and history of anesthetic complications  Airway Mallampati: II  TM Distance: >3 FB Neck ROM: full    Dental no notable dental hx. (+) Teeth Intact   Pulmonary former smoker,    Pulmonary exam normal       Cardiovascular negative cardio ROS Normal cardiovascular exam    Neuro/Psych Anxiety Depression    GI/Hepatic Neg liver ROS, GERD-  Medicated and Controlled,  Endo/Other  negative endocrine ROS  Renal/GU      Musculoskeletal   Abdominal (+) + obese,   Peds  Hematology negative hematology ROS (+)   Anesthesia Other Findings   Reproductive/Obstetrics negative OB ROS                             Anesthesia Physical  Anesthesia Plan  ASA: III  Anesthesia Plan: General   Post-op Pain Management:    Induction: Intravenous  Airway Management Planned: Oral ETT  Additional Equipment:   Intra-op Plan:   Post-operative Plan: Extubation in OR  Informed Consent: I have reviewed the patients History and Physical, chart, labs and discussed the procedure including the risks, benefits and alternatives for the proposed anesthesia with the patient or authorized representative who has indicated his/her understanding and acceptance.   Dental Advisory Given  Plan Discussed with: CRNA, Surgeon and Anesthesiologist  Anesthesia Plan Comments: (Patient here for graft removal as its causing intense pain, needing IV fentanyl in preop holding, will place ETT )        Anesthesia Quick Evaluation

## 2015-08-18 NOTE — Progress Notes (Signed)
GYNECOLOGY  VISIT   HPI: 49 y.o.   Married  Caucasian  female   [phone] with No LMP recorded. Patient has had a hysterectomy.   here for Post Op - from 08/16/15   ANTERIOR COLPORRHAPHY WITH XENOFORM GRAFT AND SACROSPINOUS FIXATION (N/A Vagina )   TRANSVAGINAL TAPE (TVT) PROCEDURE exact midurethral sling (N/A Bladder)   CYSTOSCOPY (N/A Bladder)       Patient had an uncomplicated surgery on 9/35/70 and was discharged to home with a foley catheter on 08/17/15 for urinary retention.  On prophylactic ciprofloxacin 250 mg po bid due to catheterization.   She had good pain control with oral Dilaudid and Motrin.  Developed increasing pain last hs, and called this morning with increased pain and asking to increase her dosage of Dilaudid.  Patient was asked to come in to the office for evaluation.   Patient is having pain in her rectum that is significant 9/10. Feels like the rectum is ripping apart.  Dilaudid 4 mg just takes the edge off the pain.  Unable to move around due to the pain.  Pain is more to the right side but is uncomfortable overall.  Having pulling pain.   Suprapubic pain along incision areas but this is tolerable.  Passing gas.  No BM. No vomiting.  Has foley catheter in, which is not bothersome.   No fever.    No significant vaginal bleeding.   GYNECOLOGIC HISTORY: No LMP recorded. Patient has had a hysterectomy. Contraception: hysterectomy  Menopausal hormone therapy: None Last mammogram: 01/25/15 BIRADS1:neg Last pap smear: 2014 - normal - per pt        OB History    Gravida Para Term Preterm AB TAB SAB Ectopic Multiple Living   3 2 2  1   1  2          Patient Active Problem List   Diagnosis Date Noted  . Status post surgery 08/16/2015  . Hydronephrosis, right 07/25/2015  . Adjustment disorder with anxious mood 11/01/2014  . DYSTHYMIC DISORDER 05/25/2009  . ACNE VULGARIS, FACIAL 05/25/2009  . SEBACEOUS CYST, INFECTED 04/26/2009  . DERMATITIS, ATOPIC  12/29/2008  . RESTLESS LEG SYNDROME 09/06/2008  . TRANSIENT DISORDER INITIATING/MAINTAINING SLEEP 07/23/2008  . COLONIC POLYPS, HYPERPLASTIC 05/15/2008  . HEMORRHOIDS, INTERNAL 05/15/2008  . UMBILICAL HERNIA 17/79/3903  . MIGRAINE HEADACHE 02/26/2008  . NEPHROLITHIASIS, HX OF 09/29/2007    Past Medical History  Diagnosis Date  . Anxiety   . Depression   . History of colon polyps     2008- BENIGN  . Left ureteral stone   . History of nephrolithiasis   . History of abnormal cervical Pap smear     1991 -- 1995  . Wears glasses   . GERD (gastroesophageal reflux disease)   . Frequency of urination   . Urgency of urination   . Microhematuria   . Hydronephrosis, left   . PONV (postoperative nausea and vomiting)   . Kidney stones 2016    Past Surgical History  Procedure Laterality Date  . Laparoscopic cholecystectomy  1999  . Tubal ligation Bilateral 1995  . Cystoscopy w/ ureteral stent placement  02/  2000  . Extracorporeal shock wave lithotripsy  2001  &  2002  . Dx laparoscopy  X2  . Exploratory laparotomy w/ bilateral salpingectomy and removal right ectopic preg.  02-23-2004  . Total abdominal hysterectomy w/ bilateral oophorectomy and lysis adhesions  09-02-2007  . Colonoscopy w/ polypectomy  05-30-2007  . Esophagogastroduodenoscopy  05-09-2007  .  Umbilical hernia repair  2003  . Shoulder arthroscopy with open rotator cuff repair Right 2012  . Cystoscopy with ureteroscopy and stent placement Left 05/18/2015    Procedure: CYSTOSCOPY WITH URETEROSCOPY, [place];  Surgeon: Alexis Frock, MD;  Location: [facility];  Service: Urology;  Laterality: Left;  . Cystoscopy with holmium laser lithotripsy Left 05/18/2015    Procedure: CYSTOSCOPY WITH HOLMIUM LASER LITHOTRIPSY;  Surgeon: Alexis Frock, MD;  Location: [facility];  Service: Urology;  Laterality: Left;  . Cystoscopy with retrograde pyelogram, ureteroscopy and  stent placement Left 06/22/2015    Procedure: CYSTOSCOPY,  LEFT URETEROSCOPY;  Surgeon: Cleon Gustin, MD;  Location: [facility];  Service: Urology;  Laterality: Left;  . Cystoscopy w/ retrogrades Bilateral 06/22/2015    Procedure: CYSTOSCOPY WITH RETROGRADE PYELOGRAM;  Surgeon: Cleon Gustin, MD;  Location: [facility];  Service: Urology;  Laterality: Bilateral;  . Abdominal hysterectomy    . Diagnostic laparoscopy    . Anterior and posterior repair with sacrospinous fixation N/A 08/16/2015    Procedure: ANTERIOR COLPORRHAPHY WITH XENOFORM GRAFT AND SACROSPINOUS FIXATION;  Surgeon: Nunzio Cobbs, MD;  Location: [facility];  Service: Gynecology;  Laterality: N/A;  2.5 hours OR time  . Bladder suspension N/A 08/16/2015    Procedure: TRANSVAGINAL TAPE (TVT) PROCEDURE exact midurethral sling;  Surgeon: Nunzio Cobbs, MD;  Location: [facility];  Service: Gynecology;  Laterality: N/A;  . Cystoscopy N/A 08/16/2015    Procedure: CYSTOSCOPY;  Surgeon: Nunzio Cobbs, MD;  Location: [facility];  Service: Gynecology;  Laterality: N/A;    Current Outpatient Prescriptions  Medication Sig Dispense Refill  . buPROPion (WELLBUTRIN XL) 150 MG 24 hr tablet TAKE 1 TABLET (150 MG TOTAL) BY MOUTH DAILY. 30 tablet 3  . ciprofloxacin (CIPRO) 250 MG tablet Take 1 tablet (250 mg total) by mouth 2 (two) times daily. 14 tablet 0  . FLUoxetine (PROZAC) 20 MG tablet Take 1 tablet (20 mg total) by mouth at bedtime. 30 tablet 3  . HYDROmorphone (DILAUDID) 2 MG tablet Take 1 tablet (2 mg total) by mouth every 3 (three) hours as needed for severe pain. 30 tablet 0  . ibuprofen (ADVIL,MOTRIN) 600 MG tablet Take 1 tablet (600 mg total) by mouth every 6 (six) hours as needed (mild pain). 30 tablet 0  . Potassium Citrate (UROCIT-K 15) 15 MEQ (1620 MG) TBCR Take 1 tablet by mouth 2 (two) times daily.     No current facility-administered medications for this visit.      ALLERGIES: Desvenlafaxine and Iohexol  Family History  Problem Relation Age of Onset  . Hypertension Mother   . Diabetes Father   . Cancer Paternal Uncle     Pancreas  . Heart attack Maternal Grandmother   . Drug abuse Sister   . Colon cancer Neg Hx   . Esophageal cancer Neg Hx   . Stomach cancer Neg Hx   . Rectal cancer Neg Hx     Social History   Social History  . Marital Status: Married    Spouse Name: N/A  . Number of Children: N/A  . Years of Education: N/A   Occupational History  . Not on file.   Social History Main Topics  . Smoking status: Former Smoker -- 15 years    Types: Cigarettes    Quit date: 05/16/2008  . Smokeless tobacco: Never Used  . Alcohol Use: No  .  Drug Use: No  . Sexual Activity:    Partners: Male    Birth Control/ Protection: Surgical     Comment: Hyst   Other Topics Concern  . Not on file   Social History Narrative    ROS:  Pertinent items are noted in HPI.  PHYSICAL EXAMINATION:    BP 130/70 mmHg  Pulse 84  Temp(Src) 98.2 F (36.8 C) (Oral)  Ht 5' 5.5" (1.664 m)  Wt 229 lb (103.874 kg)  BMI 37.51 kg/m2    General appearance: alert, cooperative and appears stated age.  Tearful with movement.    Abdomen:  Soft, nontender, nondistended.  Pelvic: External genitalia:  no lesions              Urethra:  normal appearing urethra with no masses, tenderness or lesions              Bartholins and Skenes: normal                 Vagina:  Sutures present along anterior vaginal wall.  Minimal blood noted. Good support.  Tender to palpation of bilateral ischial spine/sacrospinous regions.               Cervix: absent              Bimanual Exam:  Uterus:  uterus absent              Adnexa: no mass, fullness, tenderness              Rectovaginal: Yes.  .  Confirms.  No masses.  No suture palpable.              Anus:  normal sphincter tone, no lesions  Chaperone was present for exam.  ASSESSMENT  Status post anterior colporrhaphy  with Xenform graft and bilateral sacrospinous fixation, TVT Exact midurethral sling/cystoscopy.  Anal pain.  I suspect patient is having pain from the bilateral sacrospinous sutures and graft placement. Patient was advised that the right sacrospinous ligament suture was more difficult to place at the time of her surgery.   PLAN  Counseled regarding diagnosis and recommendation for return to the operating room to do surgical exploration and remove the sacrospinous sutures vaginally and remove the graft as well. Risks of surgery include bleeding, infection, damage to surrounding organs, reaction to anesthesia, pneumonia, DVT, PE, death, recurrence of prolapse, some continued pain.  I expect to keep the anterior colporrhapahy sutures in place.  Alternative is to treat pain and do observational management with physical therapy. I do not recommend this as a first line for treatment, but it may be needed afterward. Patient will go the MAU at [facility] now and will go to the OR this afternoon.   An After Visit Summary was printed and given to the patient.  ___25___ minutes face to face time of which over 50% was spent in counseling.

## 2015-08-18 NOTE — Brief Op Note (Signed)
08/18/2015  5:15 PM  PATIENT:  [name]  49 y.o. female  PRE-OPERATIVE DIAGNOSIS:  Anal pain  POST-OPERATIVE DIAGNOSIS:  Anal pain  PROCEDURE:  Procedure(s): Exam under Anesthesia, Excision Xenform Graft, Removal Bilateral Sacrospinous Ligament sutures (N/A)  SURGEON:  Surgeon(s) and Role:    * Makia Bossi E Yisroel Ramming, MD - Primary    * Salvadore Dom, MD - Assisting  PHYSICIAN ASSISTANT: NA  ASSISTANTS: Salvadore Dom, MD   ANESTHESIA:   general  EBL:  Total I/O In: 1200 [I.V.:1200] Out: 600 [Urine:500; Blood:100]  BLOOD ADMINISTERED:none  DRAINS: Urinary Catheter (Foley)   LOCAL MEDICATIONS USED:  NONE  SPECIMEN:   Xenform graft - discarded.  DISPOSITION OF SPECIMEN:   Discarded.  COUNTS:  YES  TOURNIQUET:  * No tourniquets in log *  DICTATION: .Other Dictation: Dictation Number    PLAN OF CARE: Admit for overnight observation  PATIENT DISPOSITION:  PACU - hemodynamically stable.   Delay start of Pharmacological VTE agent (>24hrs) due to surgical blood loss or risk of bleeding: not applicable

## 2015-08-18 NOTE — H&P (Signed)
Nunzio Cobbs, MD at 08/18/2015 12:06 PM     Status: Signed       Expand All Collapse All   GYNECOLOGY VISIT  HPI: 49 y.o. Married Caucasian female  [phone] with No LMP recorded. Patient has had a hysterectomy.  here for Post Op - from 08/16/15  ANTERIOR COLPORRHAPHY WITH XENOFORM GRAFT AND SACROSPINOUS FIXATION (N/A Vagina )   TRANSVAGINAL TAPE (TVT) PROCEDURE exact midurethral sling (N/A Bladder)   CYSTOSCOPY (N/A Bladder)       Patient had an uncomplicated surgery on 6/31/49 and was discharged to home with a foley catheter on 08/17/15 for urinary retention.  On prophylactic ciprofloxacin 250 mg po bid due to catheterization.   She had good pain control with oral Dilaudid and Motrin.  Developed increasing pain last hs, and called this morning with increased pain and asking to increase her dosage of Dilaudid.  Patient was asked to come in to the office for evaluation.   Patient is having pain in her rectum that is significant 9/10. Feels like the rectum is ripping apart.  Dilaudid 4 mg just takes the edge off the pain.  Unable to move around due to the pain.  Pain is more to the right side but is uncomfortable overall.  Having pulling pain.   Suprapubic pain along incision areas but this is tolerable.  Passing gas.  No BM. No vomiting.  Has foley catheter in, which is not bothersome.   No fever.   No significant vaginal bleeding.   GYNECOLOGIC HISTORY: No LMP recorded. Patient has had a hysterectomy. Contraception: hysterectomy  Menopausal hormone therapy: None Last mammogram: 01/25/15 BIRADS1:neg Last pap smear: 2014 - normal - per pt   OB History    Gravida Para Term Preterm AB TAB SAB Ectopic Multiple Living   3 2 2  1   1  2        Patient Active Problem List   Diagnosis Date Noted  . Status post surgery 08/16/2015  . Hydronephrosis, right 07/25/2015  . Adjustment disorder  with anxious mood 11/01/2014  . DYSTHYMIC DISORDER 05/25/2009  . ACNE VULGARIS, FACIAL 05/25/2009  . SEBACEOUS CYST, INFECTED 04/26/2009  . DERMATITIS, ATOPIC 12/29/2008  . RESTLESS LEG SYNDROME 09/06/2008  . TRANSIENT DISORDER INITIATING/MAINTAINING SLEEP 07/23/2008  . COLONIC POLYPS, HYPERPLASTIC 05/15/2008  . HEMORRHOIDS, INTERNAL 05/15/2008  . UMBILICAL HERNIA 70/26/3785  . MIGRAINE HEADACHE 02/26/2008  . NEPHROLITHIASIS, HX OF 09/29/2007    Past Medical History  Diagnosis Date  . Anxiety   . Depression   . History of colon polyps     2008- BENIGN  . Left ureteral stone   . History of nephrolithiasis   . History of abnormal cervical Pap smear     1991 -- 1995  . Wears glasses   . GERD (gastroesophageal reflux disease)   . Frequency of urination   . Urgency of urination   . Microhematuria   . Hydronephrosis, left   . PONV (postoperative nausea and vomiting)   . Kidney stones 2016    Past Surgical History  Procedure Laterality Date  . Laparoscopic cholecystectomy  1999  . Tubal ligation Bilateral 1995  . Cystoscopy w/ ureteral stent placement  02/ 2000  . Extracorporeal shock wave lithotripsy  2001 & 2002  . Dx laparoscopy  X2  . Exploratory laparotomy w/ bilateral salpingectomy and removal right ectopic preg.  02-23-2004  . Total abdominal hysterectomy w/ bilateral oophorectomy and lysis adhesions  09-02-2007  . Colonoscopy w/  polypectomy  05-30-2007  . Esophagogastroduodenoscopy  05-09-2007  . Umbilical hernia repair  2003  . Shoulder arthroscopy with open rotator cuff repair Right 2012  . Cystoscopy with ureteroscopy and stent placement Left 05/18/2015    Procedure: CYSTOSCOPY WITH URETEROSCOPY, [place]; Surgeon: Alexis Frock, MD; Location: [facility]; Service:  Urology; Laterality: Left;  . Cystoscopy with holmium laser lithotripsy Left 05/18/2015    Procedure: CYSTOSCOPY WITH HOLMIUM LASER LITHOTRIPSY; Surgeon: Alexis Frock, MD; Location: [facility]; Service: Urology; Laterality: Left;  . Cystoscopy with retrograde pyelogram, ureteroscopy and stent placement Left 06/22/2015    Procedure: CYSTOSCOPY, LEFT URETEROSCOPY; Surgeon: Cleon Gustin, MD; Location: [facility]; Service: Urology; Laterality: Left;  . Cystoscopy w/ retrogrades Bilateral 06/22/2015    Procedure: CYSTOSCOPY WITH RETROGRADE PYELOGRAM; Surgeon: Cleon Gustin, MD; Location: [facility]; Service: Urology; Laterality: Bilateral;  . Abdominal hysterectomy    . Diagnostic laparoscopy    . Anterior and posterior repair with sacrospinous fixation N/A 08/16/2015    Procedure: ANTERIOR COLPORRHAPHY WITH XENOFORM GRAFT AND SACROSPINOUS FIXATION; Surgeon: Nunzio Cobbs, MD; Location: [facility]; Service: Gynecology; Laterality: N/A; 2.5 hours OR time  . Bladder suspension N/A 08/16/2015    Procedure: TRANSVAGINAL TAPE (TVT) PROCEDURE exact midurethral sling; Surgeon: Nunzio Cobbs, MD; Location: [facility]; Service: Gynecology; Laterality: N/A;  . Cystoscopy N/A 08/16/2015    Procedure: CYSTOSCOPY; Surgeon: Nunzio Cobbs, MD; Location: [facility]; Service: Gynecology; Laterality: N/A;    Current Outpatient Prescriptions  Medication Sig Dispense Refill  . buPROPion (WELLBUTRIN XL) 150 MG 24 hr tablet TAKE 1 TABLET (150 MG TOTAL) BY MOUTH DAILY. 30 tablet 3  . ciprofloxacin (CIPRO) 250 MG tablet Take 1 tablet (250 mg total) by mouth 2 (two) times daily. 14 tablet 0  . FLUoxetine (PROZAC) 20 MG tablet Take 1 tablet (20 mg total) by mouth at bedtime. 30 tablet 3  . HYDROmorphone (DILAUDID) 2 MG tablet Take 1 tablet (2 mg  total) by mouth every 3 (three) hours as needed for severe pain. 30 tablet 0  . ibuprofen (ADVIL,MOTRIN) 600 MG tablet Take 1 tablet (600 mg total) by mouth every 6 (six) hours as needed (mild pain). 30 tablet 0  . Potassium Citrate (UROCIT-K 15) 15 MEQ (1620 MG) TBCR Take 1 tablet by mouth 2 (two) times daily.     No current facility-administered medications for this visit.     ALLERGIES: Desvenlafaxine and Iohexol  Family History  Problem Relation Age of Onset  . Hypertension Mother   . Diabetes Father   . Cancer Paternal Uncle     Pancreas  . Heart attack Maternal Grandmother   . Drug abuse Sister   . Colon cancer Neg Hx   . Esophageal cancer Neg Hx   . Stomach cancer Neg Hx   . Rectal cancer Neg Hx     Social History   Social History  . Marital Status: Married    Spouse Name: N/A  . Number of Children: N/A  . Years of Education: N/A   Occupational History  . Not on file.   Social History Main Topics  . Smoking status: Former Smoker -- 15 years    Types: Cigarettes    Quit date: 05/16/2008  . Smokeless tobacco: Never Used  . Alcohol Use: No  . Drug Use: No  . Sexual Activity:    Partners: Male    Birth Control/ Protection: Surgical  Comment: Hyst   Other Topics Concern  . Not on file   Social History Narrative    ROS: Pertinent items are noted in HPI.  PHYSICAL EXAMINATION:   BP 130/70 mmHg  Pulse 84  Temp(Src) 98.2 F (36.8 C) (Oral)  Ht 5' 5.5" (1.664 m)  Wt 229 lb (103.874 kg)  BMI 37.51 kg/m2  General appearance: alert, cooperative and appears stated age. Tearful with movement.   Abdomen: Soft, nontender, nondistended.  Pelvic: External genitalia: no lesions  Urethra: normal appearing urethra with no masses, tenderness or lesions  Bartholins and Skenes: normal    Vagina: Sutures present along anterior vaginal wall. Minimal blood noted. Good support. Tender to palpation of bilateral ischial spine/sacrospinous regions.   Cervix: absent   Bimanual Exam: Uterus: uterus absent  Adnexa: no mass, fullness, tenderness  Rectovaginal: Yes. . Confirms. No masses. No suture palpable.  Anus: normal sphincter tone, no lesions  Chaperone was present for exam.  ASSESSMENT  Status post anterior colporrhaphy with Xenform graft and bilateral sacrospinous fixation, TVT Exact midurethral sling/cystoscopy.  Anal pain.  I suspect patient is having pain from the bilateral sacrospinous sutures and graft placement. Patient was advised that the right sacrospinous ligament suture was more difficult to place at the time of her surgery.   PLAN  Counseled regarding diagnosis and recommendation for return to the operating room to do surgical exploration and remove the sacrospinous sutures vaginally and remove the graft as well. Risks of surgery include bleeding, infection, damage to surrounding organs, reaction to anesthesia, pneumonia, DVT, PE, death, recurrence of prolapse, some continued pain.  I expect to keep the anterior colporrhapahy sutures in place.  Alternative is to treat pain and do observational management with physical therapy. I do not recommend this as a first line for treatment, but it may be needed afterward. Patient will go the MAU at [facility] now and will go to the OR this afternoon.   An After Visit Summary was printed and given to the patient.  ___25___ minutes face to face time of which over 50% was spent in counseling.

## 2015-08-18 NOTE — MAU Note (Signed)
C/o rectal pain and pressure following surgery;

## 2015-08-18 NOTE — Discharge Summary (Signed)
Physician Discharge Summary  Patient ID: [name] MRN: [id number] DOB/AGE: Apr 10, 1966 49 y.o.  Admit date: 08/16/2015 Discharge date:  08/17/15  Admission Diagnoses:  1.  Cystocele. 2.  Vaginal vault prolapse. 3.  Genuine stress incontinence.  Discharge Diagnoses:  1.  Cystocele. 2.  Vaginal vault prolapse. 3.  Genuine stress incontinence. 4.  Status post anterior colporrhaphy with Xenform graft, bilateral sacrospinous fixation, TVT Exact midurethral sling and cystoscopy.  5.  Post op urinary retention.   Active Problems:   Status post surgery   Discharged Condition: good  Hospital Course:  The patient was admitted on 08/16/15 for an anterior colporrhaphy with Xenform graft, bilateral sacrospinous fixation, TVT Exact midurethral sling and cystoscopy which were performed without complication while under general anesthesia.  The patient's post op course was uneventful.  She had a morphine PCA and Toradol for pain control initially, and this was converted over to a dilaudid PCA on the evening of post op day zero to improve her post op pain control.  By post op day number one, the patient was converted to oral dilaudid and Motrin, and this controlled her pain well.  She ambulated independently and wore PAS and Ted hose for DVT prophylaxis while in bed.  Her foley catheter were removed on post op day one, and she was not able to void.  She had a post void residual, and a recommnedation was made to replace the foley catheter to gravity drainage.  The patient's vital signs remained stable and she demonstrated no signs of infection during her hospitalization.  The patient's post op day one Hgb was 11.3.   She was tolerating the this well.  She had very minimal vaginal bleeding, and her incision(s) demonstrated no signs of erythema or significant drainage.  She was found to be in good condition and ready for discharge on post op day one.  She was given a prescription for Ciprofloxacin 250 mg po bid  to take while her catheter was in.  This was sent to her pharmacy.   Consults: None  Significant Diagnostic Studies: labs:  Hgb 11.3 on post op day one.   Treatments: surgery:  anterior colporrhaphy with Xenform graft, bilateral sacrospinous fixation, TVT Exact midurethral sling and cystoscopy on 08/16/15.   Discharge Exam: Blood pressure 100/57, pulse 62, temperature 98.2 F (36.8 C), temperature source Oral, resp. rate 18, height 5' 5.5" (1.664 m), weight 218 lb (98.884 kg), SpO2 95 %. General: alert and cooperative Resp: clear to auscultation bilaterally Cardio: regular rate and rhythm, S1, S2 normal, no murmur, click, rub or gallop GI: soft, non-tender; bowel sounds normal; no masses, no organomegaly and incision: clean, dry, intact and suprapubic ecchymoses. Vaginal Bleeding: none  Disposition: 01-Home or Self Care  Instructions and precautions reviewed in verbal and written form.     Medication List    STOP taking these medications        oxyCODONE-acetaminophen 7.5-325 MG per tablet  Commonly known as:  PERCOCET      TAKE these medications        buPROPion 150 MG 24 hr tablet  Commonly known as:  WELLBUTRIN XL  TAKE 1 TABLET (150 MG TOTAL) BY MOUTH DAILY.     ciprofloxacin 250 MG tablet  Commonly known as:  CIPRO  Take 1 tablet (250 mg total) by mouth 2 (two) times daily.     FLUoxetine 20 MG tablet  Commonly known as:  PROZAC  Take 1 tablet (20 mg total) by mouth at bedtime.  HYDROmorphone 2 MG tablet  Commonly known as:  DILAUDID  Take 1 tablet (2 mg total) by mouth every 3 (three) hours as needed for severe pain.     ibuprofen 600 MG tablet  Commonly known as:  ADVIL,MOTRIN  Take 1 tablet (600 mg total) by mouth every 6 (six) hours as needed (mild pain).     UROCIT-K 15 15 MEQ (1620 MG) Tbcr  Generic drug:  Potassium Citrate  Take 1 tablet by mouth 2 (two) times daily.           Follow-up Information    Follow up with Arloa Koh, MD In 1  week.   Specialty:  Obstetrics and Gynecology   Contact information:   [address] [place] [address] [phone]       Signed: Arloa Koh 08/18/2015, 5:39 PM

## 2015-08-18 NOTE — Telephone Encounter (Signed)
Spoke with patient. Patient states that her son works night shift and is only available to bring her to office this afternoon. Asking for an appointment around 3:45 pm. Advised would like to get her in to be seen as soon as possible to make sure everything is okay and perform anything if anything needs to be done. Patient is agreeable. States she is unable to get a ride any earlier. Appointment scheduled for today at 3:30 pm with Dr.Silva. Patient is agreeable. Will try to see if she can obtain a ride earlier and will return call to office if so.  Routing to provider for final review. Patient agreeable to disposition. Will close encounter.   Patient aware provider will review message and nurse will return call if any additional advice or change of disposition.

## 2015-08-18 NOTE — Telephone Encounter (Signed)
I called patient to reschedule her 6 week post op appointment with Dr. Quincy Simmonds from 09/26/15 to 09/23/15 due to a schedule conflict. The patient expressed, "I am having quite a bit of pain in my bottom and would like to see if Dr. Quincy Simmonds will increase my pain medicine."

## 2015-08-18 NOTE — Transfer of Care (Signed)
Immediate Anesthesia Transfer of Care Note  Patient: [name]  Procedure(s) Performed: Procedure(s): Exam under Anesthesia, Excision Xenform Graft, Removal Bilateral Sacrospinous Ligament sutures (N/A)  Patient Location: PACU  Anesthesia Type:General  Level of Consciousness: awake, alert , oriented and patient cooperative  Airway & Oxygen Therapy: Patient Spontanous Breathing and Patient connected to nasal cannula oxygen  Post-op Assessment: Report given to RN and Post -op Vital signs reviewed and stable  Post vital signs: Reviewed and stable  Last Vitals:  Filed Vitals:   08/18/15 1413  BP: 139/82  Pulse: 73  Temp: 36.8 C  Resp: 20    Complications: No apparent anesthesia complications

## 2015-08-18 NOTE — Telephone Encounter (Signed)
Patient can increase her pain mediation to Dilaudid 4 mg every 3 hours.  I need to see the patient today to do a recheck appointment. She needs someone to drive her to office for her visit.

## 2015-08-18 NOTE — Anesthesia Procedure Notes (Signed)
Procedure Name: Intubation Date/Time: 08/18/2015 4:07 PM Performed by: Raenette Rover Pre-anesthesia Checklist: Patient identified, Patient being monitored, Emergency Drugs available and Suction available Patient Re-evaluated:Patient Re-evaluated prior to inductionOxygen Delivery Method: Circle system utilized Preoxygenation: Pre-oxygenation with 100% oxygen Intubation Type: IV induction Ventilation: Mask ventilation without difficulty Laryngoscope Size: Miller and 3 Grade View: Grade I Tube type: Oral Tube size: 7.0 mm Number of attempts: 1 Airway Equipment and Method: Patient positioned with wedge pillow and Stylet Placement Confirmation: ETT inserted through vocal cords under direct vision,  breath sounds checked- equal and bilateral,  positive ETCO2 and CO2 detector Secured at: 21 cm Tube secured with: Tape Dental Injury: Teeth and Oropharynx as per pre-operative assessment

## 2015-08-18 NOTE — Telephone Encounter (Signed)
Spoke with patient. Advised of message as seen below from [place]. Patient is agreeable. Would like to arrange a ride to the office and return call with time she is available.

## 2015-08-18 NOTE — Telephone Encounter (Addendum)
Spoke with patient. Patient had surgery on 08/16/2015 anterior colporrhaphy with xenoform graft and sacropinous fixation. Patient states that she is still in a lot of pain in her bottom with taking her pain medication. Is taking her Dilaudid 2mg  every 3 to 4 hours. Taking Ibuprofen 600mg  every 6 hours. States before taking Dilaudid her pain is a 9/10. Decreases to around 7/10, but increases after 45 minutes. Last took Dilaudid 2 mg at 8 am this morning. Took Ibuprofen 600 mg at 6:30 am. States her pain is currently at at 9/10. "I was hoping I could increase my dose. It even hurts to just walk." Advised will speak with Dr.Silva and return call with further recommendations. Patient is agreeable.

## 2015-08-19 ENCOUNTER — Encounter (HOSPITAL_COMMUNITY): Payer: Self-pay | Admitting: Obstetrics and Gynecology

## 2015-08-19 DIAGNOSIS — T8389XA Other specified complication of genitourinary prosthetic devices, implants and grafts, initial encounter: Secondary | ICD-10-CM | POA: Diagnosis not present

## 2015-08-19 LAB — CBC
HCT: 33.8 % — ABNORMAL LOW (ref 36.0–46.0)
Hemoglobin: 10.9 g/dL — ABNORMAL LOW (ref 12.0–15.0)
MCH: 28.5 pg (ref 26.0–34.0)
MCHC: 32.2 g/dL (ref 30.0–36.0)
MCV: 88.5 fL (ref 78.0–100.0)
Platelets: 200 10*3/uL (ref 150–400)
RBC: 3.82 MIL/uL — ABNORMAL LOW (ref 3.87–5.11)
RDW: 13 % (ref 11.5–15.5)
WBC: 7.9 10*3/uL (ref 4.0–10.5)

## 2015-08-19 LAB — BASIC METABOLIC PANEL
Anion gap: 5 (ref 5–15)
BUN: 12 mg/dL (ref 6–20)
CO2: 31 mmol/L (ref 22–32)
Calcium: 8.5 mg/dL — ABNORMAL LOW (ref 8.9–10.3)
Chloride: 104 mmol/L (ref 101–111)
Creatinine, Ser: 0.72 mg/dL (ref 0.44–1.00)
GFR calc Af Amer: >60 mL/min (ref >60)
GFR calc non Af Amer: >60 mL/min (ref >60)
Glucose, Bld: 138 mg/dL — ABNORMAL HIGH (ref 65–99)
Potassium: 4.3 mmol/L (ref 3.5–5.1)
Sodium: 140 mmol/L (ref 135–145)

## 2015-08-19 LAB — ABO/RH: ABO/RH(D): A POS

## 2015-08-19 NOTE — Op Note (Signed)
NAMEMarland Kitchen  Brittany, [name] NO.:  [id number]  MEDICAL RECORD NO.:  [id number]  LOCATION:  9303                          FACILITY:  [facility]  PHYSICIAN:  Lenard Galloway, [name]   DATE OF BIRTH:  Nov 27, 1966  DATE OF PROCEDURE:  08/18/2015 DATE OF DISCHARGE:                              OPERATIVE REPORT   PREOPERATIVE DIAGNOSES: 1. Status post anterior colporrhaphy with Xenform graft placement,     bilateral sacrospinous fixation, TVT Exact midurethral sling and     cystoscopy, on August 16, 2015. 2. Anal pain.  POSTOPERATIVE DIAGNOSES: 1. Status post anterior colporrhaphy with Xenform graft placement,     bilateral sacrospinous fixation, TVT Exact midurethral sling and     cystoscopy, on August 16, 2015. 2. Anal pain.  PROCEDURE:  Exam under anesthesia, excision of the Xenform graft, removal of bilateral sacrospinous ligament sutures.  SURGEON:  Lenard Galloway, [name]  ASSISTANT:  Salvadore Dom, [name]  ANESTHESIA:  General endotracheal.  IV FLUIDS:  1200 mL Ringer's lactate.  ESTIMATED BLOOD LOSS:  100 mL.  URINE OUTPUT:  500 mL.  COMPLICATIONS:  None.  INDICATIONS FOR THE PROCEDURE:  The patient is a 49 year old, gravida 3, para 2-0-1-2, Caucasian female, status post anterior colporrhaphy with Xenform graft and bilateral sacrospinous fixation, TVT Exact midurethral sling and cystoscopy on August 16, 2015, who presented to the office the day after discharge, reporting searing anal pain.  The patient had an uncomplicated hospitalization and was discharged to home with a Foley catheter on August 17, 2015, for urinary retention.  The patient had good pain control and was ambulatory at the time of her discharge.  The patient developed increasing pain on the evening of August 17, 2015, and called the office on August 18, 2015, reporting that she had rectal pain, which was a 9/10.  She stated that Dilaudid 4 mg was inadequate for pain control.  The pain was  limiting the patient's ability to move and ambulate.  She denied any numbness or weakness in her lower extremities.  She stated that the pain was a pulling pain, it was extending more along to her right buttock region.  In the office, the patient was examined and was found to have exquisite bilateral sacrospinous ligament pain.  She had no evidence of any significant bleeding.  The patient was counseled regarding her diagnosis of pain, which was attributed to the bilateral sacrospinous ligament sutures and a plan was made to proceed with exam under anesthesia, surgical excision of the Xenform graft and removal of the bilateral sacrospinous sutures after risks, benefits, and alternatives were reviewed.  The patient wished to proceed.  FINDINGS:  Exam under anesthesia revealed the anterior vaginal wall to be intact.  There were some minor ecchymoses along the suture line. Rectal examination confirmed the absence of sutures or masses in the rectum.  The patient's Foley catheter was draining clear yellow urine.  SPECIMEN:  The Xenform graft was excised and was discarded.  DESCRIPTION OF PROCEDURE:  The patient was reidentified in the maternity admissions unit of the [facility].  The patient was then brought down to the operating room, where she was placed in  the dorsal lithotomy position on the operating room table.  General endotracheal anesthesia was induced.  The patient was then sterilely prepped.  The patient's current Foley catheter was removed at this time, as it was noted to have some mucous bloody drainage on the tubing.  The urine was clear and yellow. The patient was then sterilely prepped and draped.  A clean Foley catheter was placed inside the urinary bladder and left to gravity drainage throughout the procedure.  An examination under anesthesia was performed.  The findings are as noted above.  A weighted speculum was placed inside the vagina.  The sutures along  the anterior vaginal wall were excised with a combination of using a scissors and a scalpel.  The graft was immediately identified.  The midurethral sling was noted to be in place.  The procedure began by identifying the sacrospinous ligament sutures, which were 0 Ethibond and were identified as green sutures.  Each of the sutures were grasped with a tonsil and the knots were identified on each of these fixation sutures.  Each of the sutures were cut and the entire suture and knot was removed on each side.  This did not result in any bleeding.  The anterior fixation points of Vicryl for the Xenform graft were then sharply cut at this time as well.  The fixation point of the graft along the posterior vagina in the midline with similarly excised.  The graft was completely freed at this time and was removed and discarded.  The surgical field was copiously irrigated at this time and examined.  There was very minimal bleeding over the anterior colporrhaphy repair which had some superficial ecchymoses and a couple of points of oozing over the bladder were cauterized with monopolar cautery to create good hemostasis.  The Gelfoam was then placed over the suture line anteriorly.  All of the excess suture material from the anterior vaginal mucosal closure were removed and the anterior vaginal mucosa was closed with a running lock suture of 2-0 Vicryl.  Hemostasis was noted to be very good.  A 2-inch gauze packing with Estrace cream was then placed inside the vagina.  Final rectal exam prior to placement of the vaginal packing documented no masses and no sutures in the rectum.  The patient was taken out of the dorsal lithotomy position.  She was awakened and extubated, and she was escorted to the recovery room in stable condition.  There were no complications to the procedure.  All needle, instrument, and sponge counts were correct.     Lenard Galloway, [name]     BES/MEDQ  D:  08/18/2015  T:   08/19/2015  Job:  [id number]

## 2015-08-19 NOTE — Progress Notes (Signed)
I received a referral from pt's nurse due to pt's lengthy healing process.  Pt reported that she is doing okay at this time and that she is hopeful that now things will go more smoothly.  Please page if needs arise before discharge.  [place], [place] Pager, [phone] 3:04 PM

## 2015-08-19 NOTE — Progress Notes (Signed)
GYN POST OP DAY 1.5  Nursing call.  Patient unable to void.  PVRs - 550 cc and 265 cc.   Patient able to ambulate.  Taking Percocet for pain.  Tolerating regular diet.   OK to discharge home with foley and leg bag. Previous instructions reviewed.  Resume Cipro, Dilaudid and Motrin prn. Follow up in 3 days.

## 2015-08-19 NOTE — Addendum Note (Signed)
Addendum  created 08/19/15 0844 by Ignacia Bayley, CRNA   Modules edited: Notes Section   Notes Section:  File: [id number]

## 2015-08-19 NOTE — Anesthesia Postprocedure Evaluation (Signed)
  Anesthesia Post-op Note  Patient: [name]  Procedure(s) Performed: Procedure(s): Exam under Anesthesia, Excision Xenform Graft, Removal Bilateral Sacrospinous Ligament sutures (N/A)  Patient Location: Women's Unit  Anesthesia Type:General  Level of Consciousness: awake  Airway and Oxygen Therapy: Patient Spontanous Breathing  Post-op Pain: mild  Post-op Assessment: Patient's Cardiovascular Status Stable and Respiratory Function Stable              Post-op Vital Signs: stable  Last Vitals:  Filed Vitals:   08/19/15 0535  BP: 124/71  Pulse: 62  Temp: 36.7 C  Resp: 18    Complications: No apparent anesthesia complications

## 2015-08-19 NOTE — Progress Notes (Signed)
1 Day Post-Op Procedure(s) (LRB): Exam under Anesthesia, Excision Xenform Graft, Removal Bilateral Sacrospinous Ligament sutures (N/A)  Subjective: Patient reports tolerating PO. Taking clear liquids.  Anal pain is essentially gone since she was in the PACU yesterday. Able to ambulate without the pain she had yesterday.  Foley and vaginal packing out.     Objective: I have reviewed patient's vital signs, intake and output and labs. T 98, T max 99.3, BP 124/71, P 62, RR 18. Hgb 11.0, WBC 11.4 UO 3250 cc.   General: alert and cooperative Resp: clear to auscultation bilaterally Cardio: regular rate and rhythm, S1, S2 normal, no murmur, click, rub or gallop GI: soft, non-tender; bowel sounds normal; no masses,  no organomegaly and incision: clean, dry, intact and suprapubic ecchymoses - had this yesterday. Extremities:  TEd and PAS one. DP 2+ bilaterally.  Able to flex hips and knees without any significant pain.  Vaginal Bleeding: minimal  Assessment: s/p Procedure(s): Exam under Anesthesia, Excision Xenform Graft, Removal Bilateral Sacrospinous Ligament sutures (N/A): progressing well Anal buttock pain essentially gone.  Plan: Advance diet Encourage ambulation Advance to PO medication Bladder training.   I have discussed nerve compression as a complication of the sacrospinous fixation.  Patient acknowledges that we discussed this is a possibility as part of her surgical preparation.  I have indicated how sorry I am that this has occurred to her and that I expect her to do well now and now have the recurrence of this pain again.  Will watch as an outpatient for any PT needs.  I anticipate discharge during the day and the nurse will call me with her voiding trials. Instructions and precautions given.  Has Rx for Dilaudid and Motrin.  Follow up in office in 3 days.     Otilio Groleau A Quincy Simmonds 08/19/2015, 8:01 AM

## 2015-08-19 NOTE — Discharge Instructions (Signed)
Cystocele Repair, Care After Refer to this sheet in the next few weeks. These instructions provide you with information on caring for yourself after your procedure. Your health care provider may also give you more specific instructions. Your treatment has been planned according to current medical practices, but problems sometimes occur. Call your health care provider if you have any problems or questions after your procedure. WHAT TO EXPECT AFTER THE PROCEDURE After your procedure, it is typical to have the following:  Bloody discharge from the vagina for 1-2 weeks.  A catheter in your bladder to drain urine as the bladder heals. You will be instructed on how to empty the bag. HOME CARE INSTRUCTIONS   Only take over-the-counter or prescription medicines as directed by your health care provider.  Do not take baths. Take showers until your health care provider tells you otherwise.  Exercise as instructed. Do not perform any exercise that increases the pressure on your abdomen, such as lifting weights or doing sit-ups, until your health care provider has given you permission.  You may resume your normal diet. Eat a well-balanced diet.  Drink enough fluids to keep your urine clear or pale yellow.  Avoid straining during bowel movements. If you become constipated, you may:  Take a mild laxative if your health care provider approves.  Add fruit and bran to your diet.  Drink more fluids.  Do not douche or have sexual intercourse for 6 weeks after your surgery.  Follow up with your health care provider as directed. SEEK MEDICAL CARE IF:  You have nausea or vomiting.  You have vaginal pain that is not relieved by pain medicines.  You feel a burning sensation during urination or have frequent urination.  SEEK IMMEDIATE MEDICAL CARE IF:   You have redness, swelling, or a bad-smelling discharge from the vagina.  You notice a bad smell coming from the vagina.  You have pus coming from  the vagina.  You have a fever.  You have abdominal pain.  You have excessive vaginal bleeding.  You have shortness of breath or chest pain. MAKE SURE YOU:  Understand these instructions.  Will watch your condition.  Will get help right away if you are not doing well or get worse. Document Released: 06/29/2005 Document Revised: 12/15/2013 Document Reviewed: 05/29/2013 [facility] Patient Information 2015 [place], [place]. This information is not intended to replace advice given to you by your health care provider. Make sure you discuss any questions you have with your health care provider.

## 2015-08-19 NOTE — Progress Notes (Signed)
Vaginal packing removed as ordered. Scant amount of serosanguineous drainage noted. Patient tolerated well.

## 2015-08-19 NOTE — Progress Notes (Signed)
UR chart review completed.  

## 2015-08-21 NOTE — Discharge Summary (Signed)
Physician Discharge Summary  Patient ID: [name] MRN: [id number] DOB/AGE: 1966-09-24 49 y.o.  Admit date: 08/18/2015 Discharge date: 08/19/2015  Admission Diagnoses: 1. Status post anterior colporrhaphy with Xenform graft placement,  bilateral sacrospinous fixation, TVT Exact midurethral sling and  cystoscopy, on August 16, 2015. 2. Anal pain. 3. Urinary retention.  Discharge Diagnoses:  1. Status post anterior colporrhaphy with Xenform graft placement,  bilateral sacrospinous fixation, TVT Exact midurethral sling and  cystoscopy, on August 16, 2015. 2. Anal pain. 3. Status post exam under anesthesia, excision of the Xenform graft, removal of bilateral sacrospinous ligament sutures. 4. Urinary retention.    Active Problems:   Status post surgery   Discharged Condition: good  Hospital Course:  The patient was admitted on 08/18/15  for an exam under anesthesia, excision of the Xenform graft, removal of bilateral sacrospinous ligament sutures which were performed without complication while under general anesthesia.  The patient had undergone an anterior colporrhaphy with Xenform graft placement,bilateral sacrospinous fixation, TVT Exact midurethral sling and cystoscopy, on August 16, 2015 and was discharged to home in good condition and with good pain control on 08/17/15.  The patient was discharged with a foley catheter due to urinary retention at that time.  The patient presented with anal and buttock pain which was intolerable and not controllable with oral Dilaudid and Motrin.  She denied any weakness or paresthesias.  The patient was given a diagnosis of nerve compression due to the sacrospinous ligament suture placement.  A recommendation was made for return to the operating room for removal of the bilateral sacrospinous sutures and the graft.  The patient understood that there was this potential complication of the original surgery and she agreed to removal of the  sutures and graft as above.  The patient presented to the [facility],and surgery was performed shortly thereafter.  The patient's post op course was uneventful.  In the PACU she was able to flex her hips and knees, and she reported that her anal/buttock pain had resolved.  She had a dilaudid PCA and Toradol for pain control initially, and this was converted over to Percocet and Motrin on post op day one when the patient began taking po well.  She ambulated independently and wore PAS and Ted hose for DVT prophylaxis while in bed.  Her foley catheter and vaginal packing were removed on post op day one, and she continued to have urinary retention.  They foley catheter was replaced at this time.  The patient's vital signs remained stable and she demonstrated no signs of infection during her hospitalization.  The patient's post op day one Hgb was 10.9, and she was tolerating the this well.  She had very minimal vaginal bleeding, and her incision(s) demonstrated no signs of erythema or significant drainage.  She was found to be in good condition and ready for discharge on post op day one.     Consults: None  Significant Diagnostic Studies: labs:  See hospital course.  Treatments: surgery:  exam under anesthesia, excision of the Xenform graft, removal of bilateral sacrospinous ligament sutures.  Discharge Exam: Blood pressure 110/58, pulse 61, temperature 98 F (36.7 C), temperature source Oral, resp. rate 18, height 5' 5.5" (1.664 m), weight 229 lb (103.874 kg), SpO2 95 %. General: alert and cooperative Resp: clear to auscultation bilaterally Cardio: regular rate and rhythm, S1, S2 normal, no murmur, click, rub or gallop GI: soft, non-tender; bowel sounds normal; no masses, no organomegaly and incision: clean, dry, intact and suprapubic  ecchymoses - had this yesterday. Extremities:  TEd and PAS one. DP 2+ bilaterally. Able to flex hips and knees without any significant pain.  Vaginal  Bleeding: minimal   Disposition: 01-Home or Self Care  Instructions and precautions reviewed in verbal and written form.     Medication List    TAKE these medications        buPROPion 150 MG 24 hr tablet  Commonly known as:  WELLBUTRIN XL  TAKE 1 TABLET (150 MG TOTAL) BY MOUTH DAILY.     ciprofloxacin 250 MG tablet  Commonly known as:  CIPRO  Take 1 tablet (250 mg total) by mouth 2 (two) times daily.     FLUoxetine 20 MG tablet  Commonly known as:  PROZAC  Take 1 tablet (20 mg total) by mouth at bedtime.     HYDROmorphone 2 MG tablet  Commonly known as:  DILAUDID  Take 1 tablet (2 mg total) by mouth every 3 (three) hours as needed for severe pain.     ibuprofen 600 MG tablet  Commonly known as:  ADVIL,MOTRIN  Take 1 tablet (600 mg total) by mouth every 6 (six) hours as needed (mild pain).     UROCIT-K 15 15 MEQ (1620 MG) Tbcr  Generic drug:  Potassium Citrate  Take 1 tablet by mouth 2 (two) times daily.           Follow-up Information    Follow up with Arloa Koh, MD In 3 days.   Specialty:  Obstetrics and Gynecology   Contact information:   [address] [place] [address] [phone]      Signed: Arloa Koh 08/21/2015, 12:19 PM

## 2015-08-22 ENCOUNTER — Encounter: Payer: Self-pay | Admitting: Obstetrics and Gynecology

## 2015-08-22 ENCOUNTER — Telehealth: Payer: Self-pay | Admitting: Obstetrics and Gynecology

## 2015-08-22 ENCOUNTER — Ambulatory Visit (INDEPENDENT_AMBULATORY_CARE_PROVIDER_SITE_OTHER): Payer: BLUE CROSS/BLUE SHIELD | Admitting: Obstetrics and Gynecology

## 2015-08-22 VITALS — BP 122/78 | HR 84 | Temp 98.7°F | Resp 18 | Ht 65.5 in | Wt 228.0 lb

## 2015-08-22 DIAGNOSIS — K5900 Constipation, unspecified: Secondary | ICD-10-CM

## 2015-08-22 DIAGNOSIS — R339 Retention of urine, unspecified: Secondary | ICD-10-CM | POA: Diagnosis not present

## 2015-08-22 DIAGNOSIS — N9489 Other specified conditions associated with female genital organs and menstrual cycle: Secondary | ICD-10-CM

## 2015-08-22 DIAGNOSIS — R102 Pelvic and perineal pain: Secondary | ICD-10-CM

## 2015-08-22 MED ORDER — CIPROFLOXACIN HCL 250 MG PO TABS: 250.0000 mg | ORAL_TABLET | Freq: Every day | ORAL | Status: DC

## 2015-08-22 NOTE — Telephone Encounter (Signed)
Patient seen in office.  Encounter closed.

## 2015-08-22 NOTE — Telephone Encounter (Signed)
Spoke with Dr.Silva. Dr.Silva would like patient to be seen in office for catheterization this afternoon. Call to patient with Dr.Silva in office. Patient states she is 10-15 minutes away from the office and will have her husband bring her. Advised will need to leave at this time for evaluation. Patient is agreeable and will head to the office now to be seen with Dr.Silva.

## 2015-08-22 NOTE — Telephone Encounter (Signed)
Patient trying to take catheter out from surgery and in a lot of pain.

## 2015-08-22 NOTE — Telephone Encounter (Signed)
Spoke with patient at time of incoming call. Patient states that she has been unable to in and out cath herself since leaving the office this morning. States she has only been able to get a "few drops" out. Patient feels distended and is in a lot of pain from being not being able to use the restroom. "I can't sit or stand or walk. I have to use the restroom so bad and can't." Patient is worried about if he husband is not able to help what she needs to do. Advised I will speak with Dr.Silva and return her call. Patient is agreeable.

## 2015-08-22 NOTE — Telephone Encounter (Addendum)
Dr.Silva is currently seeing another patient in our office. Spoke with Lamont Snowball, RN who advises patient go to [facility] at this time for further evaluation and catheterization. Spoke with patient. Advised to head to [facility] at this time for further evaluation and catheterization if needed. Patient is agreeable.

## 2015-08-22 NOTE — Progress Notes (Signed)
GYNECOLOGY  VISIT   HPI: 49 y.o.   Married  Caucasian  female   [phone] with No LMP recorded. Patient has had a hysterectomy.   here for Post Op 08/18/15 -Exam under Anesthesia, Excision Xenform Graft, Removal Bilateral Sacrospinous Ligament sutures (N/A Vagina )   Anal/buttock pain gone.   Having some cramping and urgency.  Here for foley catheter removal.  On Ciprofloxacin.  Had some blood in urine bag last hs.   No bowel movement yet.  GYNECOLOGIC HISTORY: No LMP recorded. Patient has had a hysterectomy. Contraception: Hysterectomy  Menopausal hormone therapy: None Last mammogram: 01/25/15 BIRADS1:neg Last pap smear: 2014 Normal         OB History    Gravida Para Term Preterm AB TAB SAB Ectopic Multiple Living   3 2 2  1   1  2          Patient Active Problem List   Diagnosis Date Noted  . Status post surgery 08/16/2015  . Hydronephrosis, right 07/25/2015  . Adjustment disorder with anxious mood 11/01/2014  . DYSTHYMIC DISORDER 05/25/2009  . ACNE VULGARIS, FACIAL 05/25/2009  . SEBACEOUS CYST, INFECTED 04/26/2009  . DERMATITIS, ATOPIC 12/29/2008  . RESTLESS LEG SYNDROME 09/06/2008  . TRANSIENT DISORDER INITIATING/MAINTAINING SLEEP 07/23/2008  . COLONIC POLYPS, HYPERPLASTIC 05/15/2008  . HEMORRHOIDS, INTERNAL 05/15/2008  . UMBILICAL HERNIA 92/42/6834  . MIGRAINE HEADACHE 02/26/2008  . NEPHROLITHIASIS, HX OF 09/29/2007    Past Medical History  Diagnosis Date  . Anxiety   . Depression   . History of colon polyps     2008- BENIGN  . Left ureteral stone   . History of nephrolithiasis   . History of abnormal cervical Pap smear     1991 -- 1995  . Wears glasses   . GERD (gastroesophageal reflux disease)   . Frequency of urination   . Urgency of urination   . Microhematuria   . Hydronephrosis, left   . PONV (postoperative nausea and vomiting)   . Kidney stones 2016    Past Surgical History  Procedure Laterality Date  . Laparoscopic cholecystectomy  1999  .  Tubal ligation Bilateral 1995  . Cystoscopy w/ ureteral stent placement  02/  2000  . Extracorporeal shock wave lithotripsy  2001  &  2002  . Dx laparoscopy  X2  . Exploratory laparotomy w/ bilateral salpingectomy and removal right ectopic preg.  02-23-2004  . Total abdominal hysterectomy w/ bilateral oophorectomy and lysis adhesions  09-02-2007  . Colonoscopy w/ polypectomy  05-30-2007  . Esophagogastroduodenoscopy  05-09-2007  . Umbilical hernia repair  2003  . Shoulder arthroscopy with open rotator cuff repair Right 2012  . Cystoscopy with ureteroscopy and stent placement Left 05/18/2015    Procedure: CYSTOSCOPY WITH URETEROSCOPY, [place];  Surgeon: Alexis Frock, MD;  Location: [facility];  Service: Urology;  Laterality: Left;  . Cystoscopy with holmium laser lithotripsy Left 05/18/2015    Procedure: CYSTOSCOPY WITH HOLMIUM LASER LITHOTRIPSY;  Surgeon: Alexis Frock, MD;  Location: [facility];  Service: Urology;  Laterality: Left;  . Cystoscopy with retrograde pyelogram, ureteroscopy and stent placement Left 06/22/2015    Procedure: CYSTOSCOPY,  LEFT URETEROSCOPY;  Surgeon: Cleon Gustin, MD;  Location: [facility];  Service: Urology;  Laterality: Left;  . Cystoscopy w/ retrogrades Bilateral 06/22/2015    Procedure: CYSTOSCOPY WITH RETROGRADE PYELOGRAM;  Surgeon: Cleon Gustin, MD;  Location: [facility];  Service: Urology;  Laterality: Bilateral;  .  Abdominal hysterectomy    . Diagnostic laparoscopy    . Anterior and posterior repair with sacrospinous fixation N/A 08/16/2015    Procedure: ANTERIOR COLPORRHAPHY WITH XENOFORM GRAFT AND SACROSPINOUS FIXATION;  Surgeon: Nunzio Cobbs, MD;  Location: [facility];  Service: Gynecology;  Laterality: N/A;  2.5 hours OR time  . Bladder suspension N/A 08/16/2015    Procedure: TRANSVAGINAL TAPE (TVT) PROCEDURE exact midurethral sling;  Surgeon:  Nunzio Cobbs, MD;  Location: [facility];  Service: Gynecology;  Laterality: N/A;  . Cystoscopy N/A 08/16/2015    Procedure: CYSTOSCOPY;  Surgeon: Nunzio Cobbs, MD;  Location: [facility];  Service: Gynecology;  Laterality: N/A;  . Vaginal hysterectomy N/A 08/18/2015    Procedure: Exam under Anesthesia, Excision Xenform Graft, Removal Bilateral Sacrospinous Ligament sutures;  Surgeon: Nunzio Cobbs, MD;  Location: [facility];  Service: Gynecology;  Laterality: N/A;    Current Outpatient Prescriptions  Medication Sig Dispense Refill  . buPROPion (WELLBUTRIN XL) 150 MG 24 hr tablet TAKE 1 TABLET (150 MG TOTAL) BY MOUTH DAILY. 30 tablet 3  . ciprofloxacin (CIPRO) 250 MG tablet Take 1 tablet (250 mg total) by mouth 2 (two) times daily. 14 tablet 0  . FLUoxetine (PROZAC) 20 MG tablet Take 1 tablet (20 mg total) by mouth at bedtime. 30 tablet 3  . HYDROmorphone (DILAUDID) 2 MG tablet Take 1 tablet (2 mg total) by mouth every 3 (three) hours as needed for severe pain. 30 tablet 0  . ibuprofen (ADVIL,MOTRIN) 600 MG tablet Take 1 tablet (600 mg total) by mouth every 6 (six) hours as needed (mild pain). 30 tablet 0  . Potassium Citrate (UROCIT-K 15) 15 MEQ (1620 MG) TBCR Take 1 tablet by mouth 2 (two) times daily.     No current facility-administered medications for this visit.     ALLERGIES: Desvenlafaxine and Iohexol  Family History  Problem Relation Age of Onset  . Hypertension Mother   . Diabetes Father   . Cancer Paternal Uncle     Pancreas  . Heart attack Maternal Grandmother   . Drug abuse Sister   . Colon cancer Neg Hx   . Esophageal cancer Neg Hx   . Stomach cancer Neg Hx   . Rectal cancer Neg Hx     Social History   Social History  . Marital Status: Married    Spouse Name: N/A  . Number of Children: N/A  . Years of Education: N/A   Occupational History  . Not on file.   Social History Main Topics  . Smoking status: Former Smoker -- 15 years    Types:  Cigarettes    Quit date: 05/16/2008  . Smokeless tobacco: Never Used  . Alcohol Use: No  . Drug Use: No  . Sexual Activity:    Partners: Male    Birth Control/ Protection: Surgical     Comment: Hyst   Other Topics Concern  . Not on file   Social History Narrative    ROS:  Pertinent items are noted in HPI.  PHYSICAL EXAMINATION:    BP 122/78 mmHg  Pulse 84  Temp(Src) 98.7 F (37.1 C) (Oral)  Resp 18  Ht 5' 5.5" (1.664 m)  Wt 228 lb (103.42 kg)  BMI 37.35 kg/m2    General appearance: alert, cooperative and appears stated age Abdomen: soft, non-tender; bowel sounds normal; no masses,  no organomegaly   Pelvic: External genitalia:   Suprapubic minimal ecchymoses.  No  induration.               Urethra:  normal appearing urethra with no masses, tenderness or lesions              Bartholins and Skenes: normal                 Vagina:  Suture lines intact.              Cervix: absent            Bimanual Exam:  Uterus:  uterus absent              Adnexa: normal adnexa and no mass, fullness, tenderness             Chaperone was present for exam.  Procedure - Foley catheter removed and discarded after urine specimen taken the the bag and sent for urine culture.  Patient taught intermittent self catheterization successfully.  ASSESSMENT  Status post removal of bilateral sacrospinous ligament sutures and Xenform graft.  Urinary retention.   Constipation.   PLAN  Counseled regarding procedure for clean intermittent self catheterization.  5 catheter tips given to patient.  Miralax recommended. Follow up in one week.   An After Visit Summary was printed and given to the patient.  Addendum -  Patient called at the end of the day stating she was voiding a little but unable to catheterize herself and was uncomfortable.  Patient presented to the office at 5:10 pm.   Sterile catheterization using Hibiclens and silicone foley catheter with a bag.  Approximately 1000 cc of  clear light urine drained from the bladder and pain resolved. Bag removed from foley tip, and sterile plug then placed. Patient and husband instructed in use for removing plug to drain bladder every 3 hours.  Ciprofloxacin 250 mg daily.   Rx to pharmacy.  Follow up in 4 days, sooner as needed.  After visit summary to patient.

## 2015-08-23 LAB — URINE CULTURE
Colony Count: NO GROWTH
Organism ID, Bacteria: NO GROWTH

## 2015-08-26 ENCOUNTER — Ambulatory Visit (INDEPENDENT_AMBULATORY_CARE_PROVIDER_SITE_OTHER): Payer: BLUE CROSS/BLUE SHIELD | Admitting: Obstetrics and Gynecology

## 2015-08-26 ENCOUNTER — Encounter: Payer: Self-pay | Admitting: Obstetrics and Gynecology

## 2015-08-26 VITALS — BP 132/80 | HR 80 | Temp 98.5°F | Resp 16 | Ht 65.5 in | Wt 216.0 lb

## 2015-08-26 DIAGNOSIS — R339 Retention of urine, unspecified: Secondary | ICD-10-CM

## 2015-08-26 MED ORDER — DIAZEPAM 2 MG PO TABS: ORAL_TABLET | ORAL | Status: DC

## 2015-08-26 NOTE — Progress Notes (Signed)
GYNECOLOGY  VISIT   HPI: 49 y.o.   Married  Caucasian  female   [phone] with Patient's last menstrual period was 12/24/2006 (approximate).   here for  Follow up - Urinary retention.  Feeling better since the bag is off the foley and just doing intermittent opening of the foley tip to empty her bladder. Can feel when her bladder is filling.  Using Dilaudid only at night.  Normal bowel movements.  Ambulating well.  Some soreness in right buttock and numbness along right posterior thigh, but this is improving.  No further significant pain in anal/buttock region.   UC negative 08/22/15.  Asking about travel this weekend.   GYNECOLOGIC HISTORY: Patient's last menstrual period was 12/24/2006 (approximate). Contraception: Hysterectomy  Menopausal hormone therapy: None Last mammogram: 01/25/15 BIRADS1:Neg Last pap smear: 2014 Normal         OB History    Gravida Para Term Preterm AB TAB SAB Ectopic Multiple Living   3 2 2  1   1  2          Patient Active Problem List   Diagnosis Date Noted  . Status post surgery 08/16/2015  . Hydronephrosis, right 07/25/2015  . Adjustment disorder with anxious mood 11/01/2014  . DYSTHYMIC DISORDER 05/25/2009  . ACNE VULGARIS, FACIAL 05/25/2009  . SEBACEOUS CYST, INFECTED 04/26/2009  . DERMATITIS, ATOPIC 12/29/2008  . RESTLESS LEG SYNDROME 09/06/2008  . TRANSIENT DISORDER INITIATING/MAINTAINING SLEEP 07/23/2008  . COLONIC POLYPS, HYPERPLASTIC 05/15/2008  . HEMORRHOIDS, INTERNAL 05/15/2008  . UMBILICAL HERNIA 64/40/3474  . MIGRAINE HEADACHE 02/26/2008  . NEPHROLITHIASIS, HX OF 09/29/2007    Past Medical History  Diagnosis Date  . Anxiety   . Depression   . History of colon polyps     2008- BENIGN  . Left ureteral stone   . History of nephrolithiasis   . History of abnormal cervical Pap smear     1991 -- 1995  . Wears glasses   . GERD (gastroesophageal reflux disease)   . Frequency of urination   . Urgency of urination   .  Microhematuria   . Hydronephrosis, left   . PONV (postoperative nausea and vomiting)   . Kidney stones 2016    Past Surgical History  Procedure Laterality Date  . Laparoscopic cholecystectomy  1999  . Tubal ligation Bilateral 1995  . Cystoscopy w/ ureteral stent placement  02/  2000  . Extracorporeal shock wave lithotripsy  2001  &  2002  . Dx laparoscopy  X2  . Exploratory laparotomy w/ bilateral salpingectomy and removal right ectopic preg.  02-23-2004  . Total abdominal hysterectomy w/ bilateral oophorectomy and lysis adhesions  09-02-2007  . Colonoscopy w/ polypectomy  05-30-2007  . Esophagogastroduodenoscopy  05-09-2007  . Umbilical hernia repair  2003  . Shoulder arthroscopy with open rotator cuff repair Right 2012  . Cystoscopy with ureteroscopy and stent placement Left 05/18/2015    Procedure: CYSTOSCOPY WITH URETEROSCOPY, [place];  Surgeon: Alexis Frock, MD;  Location: [facility];  Service: Urology;  Laterality: Left;  . Cystoscopy with holmium laser lithotripsy Left 05/18/2015    Procedure: CYSTOSCOPY WITH HOLMIUM LASER LITHOTRIPSY;  Surgeon: Alexis Frock, MD;  Location: [facility];  Service: Urology;  Laterality: Left;  . Cystoscopy with retrograde pyelogram, ureteroscopy and stent placement Left 06/22/2015    Procedure: CYSTOSCOPY,  LEFT URETEROSCOPY;  Surgeon: Cleon Gustin, MD;  Location: [facility];  Service: Urology;  Laterality: Left;  . Cystoscopy  w/ retrogrades Bilateral 06/22/2015    Procedure: CYSTOSCOPY WITH RETROGRADE PYELOGRAM;  Surgeon: Cleon Gustin, MD;  Location: [facility];  Service: Urology;  Laterality: Bilateral;  . Abdominal hysterectomy    . Diagnostic laparoscopy    . Anterior and posterior repair with sacrospinous fixation N/A 08/16/2015    Procedure: ANTERIOR COLPORRHAPHY WITH XENOFORM GRAFT AND SACROSPINOUS FIXATION;  Surgeon: Nunzio Cobbs, MD;  Location: [facility];  Service: Gynecology;  Laterality: N/A;  2.5 hours OR time  . Bladder suspension N/A 08/16/2015    Procedure: TRANSVAGINAL TAPE (TVT) PROCEDURE exact midurethral sling;  Surgeon: Nunzio Cobbs, MD;  Location: [place] [facility];  Service: Gynecology;  Laterality: N/A;  . Cystoscopy N/A 08/16/2015    Procedure: CYSTOSCOPY;  Surgeon: Nunzio Cobbs, MD;  Location: [place] [facility];  Service: Gynecology;  Laterality: N/A;  . Vaginal hysterectomy N/A 08/18/2015    Procedure: Exam under Anesthesia, Excision Xenform Graft, Removal Bilateral Sacrospinous Ligament sutures;  Surgeon: Nunzio Cobbs, MD;  Location: [facility];  Service: Gynecology;  Laterality: N/A;    Current Outpatient Prescriptions  Medication Sig Dispense Refill  . buPROPion (WELLBUTRIN XL) 150 MG 24 hr tablet TAKE 1 TABLET (150 MG TOTAL) BY MOUTH DAILY. 30 tablet 3  . ciprofloxacin (CIPRO) 250 MG tablet Take 1 tablet (250 mg total) by mouth daily with breakfast. 14 tablet 0  . FLUoxetine (PROZAC) 20 MG tablet Take 1 tablet (20 mg total) by mouth at bedtime. 30 tablet 3  . HYDROmorphone (DILAUDID) 2 MG tablet Take 1 tablet (2 mg total) by mouth every 3 (three) hours as needed for severe pain. 30 tablet 0  . ibuprofen (ADVIL,MOTRIN) 600 MG tablet Take 1 tablet (600 mg total) by mouth every 6 (six) hours as needed (mild pain). 30 tablet 0  . Potassium Citrate (UROCIT-K 15) 15 MEQ (1620 MG) TBCR Take 1 tablet by mouth 2 (two) times daily.     No current facility-administered medications for this visit.     ALLERGIES: Desvenlafaxine and Iohexol  Family History  Problem Relation Age of Onset  . Hypertension Mother   . Diabetes Father   . Cancer Paternal Uncle     Pancreas  . Heart attack Maternal Grandmother   . Drug abuse Sister   . Colon cancer Neg Hx   . Esophageal cancer Neg Hx   . Stomach cancer Neg Hx   . Rectal cancer Neg Hx     Social History   Social History  . Marital Status:  Married    Spouse Name: N/A  . Number of Children: N/A  . Years of Education: N/A   Occupational History  . Not on file.   Social History Main Topics  . Smoking status: Former Smoker -- 15 years    Types: Cigarettes    Quit date: 05/16/2008  . Smokeless tobacco: Never Used  . Alcohol Use: No  . Drug Use: No  . Sexual Activity:    Partners: Male    Birth Control/ Protection: Surgical     Comment: Hyst   Other Topics Concern  . Not on file   Social History Narrative    ROS:  Pertinent items are noted in HPI.  PHYSICAL EXAMINATION:    BP 132/80 mmHg  Pulse 80  Temp(Src) 98.5 F (36.9 C) (Oral)  Resp 16  Ht 5' 5.5" (1.664 m)  Wt 216 lb (97.977 kg)  BMI 35.38 kg/m2  LMP 12/24/2006 (  Approximate)    General appearance: alert, cooperative and appears stated age   Pelvic: External genitalia:  no lesions              Urethra:  normal appearing urethra with no masses, tenderness or lesions              Bartholins and Skenes: normal                 Vagina: normal appearing vagina with normal color and discharge, no lesions.  Suture line intact.  No masses or tenderness.  Sling protected.  Foley catheter removed without difficulty.  Chaperone was present for exam.  ASSESSMENT  Status post anterior colporrhaphy with midurethral sling.  Status post excision of bilateral sacrospinous fixation sutures and Xenform graft on anterior vaginal wall. Urinary retention.  Sciatic nerve compression from sacrospinous fixation.  Neuropathy mild and resolving.   PLAN  Counseled regarding nerve compression.  Will do voiding trial in office now.  If unable to void will need to consider reteaching self cath and giving Rx for Valium.  Addendum  Voided 100 cc.  Procedure - self cath Reviewed self catheterization procedure.  Patient able to successfully do this self cath.  PVR 450 cc.  Patient will call office in 6 days to report voiding trials. OK to keep appointment for 6 week  post op check and office visits sooner if needed.  Valium 2 mg po q hs.  #30, RF zero.  An After Visit Summary was printed and given to the patient.

## 2015-08-30 ENCOUNTER — Telehealth: Payer: Self-pay

## 2015-08-30 ENCOUNTER — Encounter: Payer: Self-pay | Admitting: Obstetrics and Gynecology

## 2015-08-30 NOTE — Telephone Encounter (Signed)
Spoke with patient. Advised of message as seen below from [place]. Patient is agreeable and verbalizes understanding. Patient is asking if she is able to drive herself to the office for OV tomorrow. Last took Dilaudid 2 mg on Saturday 9/3. Patient had surgery on 08/18/2015. Advised will need to speak with Dr.Silva regarding driving and return call to schedule appointment. Patient is agreeable.

## 2015-08-30 NOTE — Telephone Encounter (Signed)
-----   Message from Nunzio Cobbs, MD sent at 08/30/2015 12:16 PM EDT ----- Regarding: please contact patient to schedule appt with me for 9/7 am Please contact patient regarding her My Chart message.   This is what I sent to her just now:  "Hi [name],   You need to have an office visit with me.  Take some of the Ciprofloxacin that you have at home 250 mg by mouth twice a day, and I will see you tomorrow morning.   I can refer you for physical therapy for your buttock pain after I see you in the office.   I will have the office call you to schedule this visit with me.   Thank you,   [name]"   [address]

## 2015-08-30 NOTE — Telephone Encounter (Signed)
Spoke with patient. Advised per Dr.Silva okay to drive to appointment tomorrow as long as she is not taking narcotics. Patient is agreeable. Appointment scheduled for tomorrow at 10 am with Dr.Silva. Advised if anything changes and needs to take pain medication she will need to have someone drive her to her appointment. Patient is agreeable.  Routing to Dr.Silva for review before closing.

## 2015-08-30 NOTE — Telephone Encounter (Signed)
I approved patient driving if not taking narcotics.  Thank you for scheduling this appointment.   Encounter closed.

## 2015-08-31 ENCOUNTER — Ambulatory Visit (INDEPENDENT_AMBULATORY_CARE_PROVIDER_SITE_OTHER): Payer: BLUE CROSS/BLUE SHIELD | Admitting: Obstetrics and Gynecology

## 2015-08-31 ENCOUNTER — Ambulatory Visit: Payer: BLUE CROSS/BLUE SHIELD | Admitting: Obstetrics and Gynecology

## 2015-08-31 ENCOUNTER — Encounter: Payer: Self-pay | Admitting: Obstetrics and Gynecology

## 2015-08-31 VITALS — BP 130/66 | HR 88 | Temp 98.5°F | Ht 65.5 in | Wt 218.0 lb

## 2015-08-31 DIAGNOSIS — M5431 Sciatica, right side: Secondary | ICD-10-CM

## 2015-08-31 DIAGNOSIS — R350 Frequency of micturition: Secondary | ICD-10-CM | POA: Diagnosis not present

## 2015-08-31 LAB — CBC WITH DIFFERENTIAL/PLATELET
Basophils Absolute: 0 10*3/uL (ref 0.0–0.1)
Basophils Relative: 0 % (ref 0–1)
Eosinophils Absolute: 0.1 10*3/uL (ref 0.0–0.7)
Eosinophils Relative: 1 % (ref 0–5)
HCT: 40.3 % (ref 36.0–46.0)
Hemoglobin: 13.7 g/dL (ref 12.0–15.0)
Lymphocytes Relative: 27 % (ref 12–46)
Lymphs Abs: 1.8 10*3/uL (ref 0.7–4.0)
MCH: 29 pg (ref 26.0–34.0)
MCHC: 34 g/dL (ref 30.0–36.0)
MCV: 85.2 fL (ref 78.0–100.0)
MPV: 9.7 fL (ref 8.6–12.4)
Monocytes Absolute: 0.6 10*3/uL (ref 0.1–1.0)
Monocytes Relative: 9 % (ref 3–12)
Neutro Abs: 4.3 10*3/uL (ref 1.7–7.7)
Neutrophils Relative %: 63 % (ref 43–77)
Platelets: 338 10*3/uL (ref 150–400)
RBC: 4.73 MIL/uL (ref 3.87–5.11)
RDW: 13.6 % (ref 11.5–15.5)
WBC: 6.8 10*3/uL (ref 4.0–10.5)

## 2015-08-31 LAB — POCT URINALYSIS DIPSTICK
Bilirubin, UA: NEGATIVE
Glucose, UA: NEGATIVE
Ketones, UA: NEGATIVE
Nitrite, UA: NEGATIVE
Protein, UA: NEGATIVE
Urobilinogen, UA: NEGATIVE
pH, UA: 5

## 2015-08-31 LAB — URINALYSIS, MICROSCOPIC ONLY
Bacteria, UA: NONE SEEN [HPF]
Casts: NONE SEEN [LPF]
Yeast: NONE SEEN [HPF]

## 2015-08-31 MED ORDER — SULFAMETHOXAZOLE-TRIMETHOPRIM 800-160 MG PO TABS: 1.0000 | ORAL_TABLET | Freq: Two times a day (BID) | ORAL | Status: DC

## 2015-08-31 MED ORDER — FLUCONAZOLE 150 MG PO TABS: 150.0000 mg | ORAL_TABLET | Freq: Once | ORAL | Status: DC

## 2015-08-31 MED ORDER — PHENAZOPYRIDINE HCL 200 MG PO TABS: 200.0000 mg | ORAL_TABLET | Freq: Three times a day (TID) | ORAL | Status: DC | PRN

## 2015-08-31 NOTE — Patient Instructions (Signed)
Sciatica Sciatica is pain, weakness, numbness, or tingling along the path of the sciatic nerve. The nerve starts in the lower back and runs down the back of each leg. The nerve controls the muscles in the lower leg and in the back of the knee, while also providing sensation to the back of the thigh, lower leg, and the sole of your foot. Sciatica is a symptom of another medical condition. For instance, nerve damage or certain conditions, such as a herniated disk or bone spur on the spine, pinch or put pressure on the sciatic nerve. This causes the pain, weakness, or other sensations normally associated with sciatica. Generally, sciatica only affects one side of the body. CAUSES   Herniated or slipped disc.  Degenerative disk disease.  A pain disorder involving the narrow muscle in the buttocks (piriformis syndrome).  Pelvic injury or fracture.  Pregnancy.  Tumor (rare). SYMPTOMS  Symptoms can vary from mild to very severe. The symptoms usually travel from the low back to the buttocks and down the back of the leg. Symptoms can include:  Mild tingling or dull aches in the lower back, leg, or hip.  Numbness in the back of the calf or sole of the foot.  Burning sensations in the lower back, leg, or hip.  Sharp pains in the lower back, leg, or hip.  Leg weakness.  Severe back pain inhibiting movement. These symptoms may get worse with coughing, sneezing, laughing, or prolonged sitting or standing. Also, being overweight may worsen symptoms. DIAGNOSIS  Your caregiver will perform a physical exam to look for common symptoms of sciatica. He or she may ask you to do certain movements or activities that would trigger sciatic nerve pain. Other tests may be performed to find the cause of the sciatica. These may include:  Blood tests.  X-rays.  Imaging tests, such as an MRI or CT scan. TREATMENT  Treatment is directed at the cause of the sciatic pain. Sometimes, treatment is not necessary  and the pain and discomfort goes away on its own. If treatment is needed, your caregiver may suggest:  Over-the-counter medicines to relieve pain.  Prescription medicines, such as anti-inflammatory medicine, muscle relaxants, or narcotics.  Applying heat or ice to the painful area.  Steroid injections to lessen pain, irritation, and inflammation around the nerve.  Reducing activity during periods of pain.  Exercising and stretching to strengthen your abdomen and improve flexibility of your spine. Your caregiver may suggest losing weight if the extra weight makes the back pain worse.  Physical therapy.  Surgery to eliminate what is pressing or pinching the nerve, such as a bone spur or part of a herniated disk. HOME CARE INSTRUCTIONS   Only take over-the-counter or prescription medicines for pain or discomfort as directed by your caregiver.  Apply ice to the affected area for 20 minutes, 3-4 times a day for the first 48-72 hours. Then try heat in the same way.  Exercise, stretch, or perform your usual activities if these do not aggravate your pain.  Attend physical therapy sessions as directed by your caregiver.  Keep all follow-up appointments as directed by your caregiver.  Do not wear high heels or shoes that do not provide proper support.  Check your mattress to see if it is too soft. A firm mattress may lessen your pain and discomfort. SEEK IMMEDIATE MEDICAL CARE IF:   You lose control of your bowel or bladder (incontinence).  You have increasing weakness in the lower back, pelvis, buttocks,   or legs.  You have redness or swelling of your back.  You have a burning sensation when you urinate.  You have pain that gets worse when you lie down or awakens you at night.  Your pain is worse than you have experienced in the past.  Your pain is lasting longer than 4 weeks.  You are suddenly losing weight without reason. MAKE SURE YOU:  Understand these  instructions.  Will watch your condition.  Will get help right away if you are not doing well or get worse. Document Released: 12/04/2001 Document Revised: 06/10/2012 Document Reviewed: 04/20/2012 Exit[employer] Patient Information 2015 [employer]. This information is not intended to replace advice given to you by your health care provider. Make sure you discuss any questions you have with your health care provider.  

## 2015-08-31 NOTE — Progress Notes (Signed)
GYNECOLOGY  VISIT   HPI: 49 y.o.   Married  Caucasian  female   [phone] with Patient's last menstrual period was 12/24/2006 (approximate).   here for Urinary Frequency and  Post Op from 08/18/15 -Excision Xenform Graft, Removal Bilateral Sacrospinous Ligament sutures.  Has some urinary burning.  Sent a message on my chart about urinary discomfort. I responded to patient to start Ciprofloxacin 250 gm po bid.   Is voiding about 200 cc on own.  Does self cath one time per day.   Some right sided back pain.  No fever.  No nausea or vomiting.   Some right posterior right thigh discomfort.  Can be sore with movement.  Takes Dilaudid prn.  Is able to drive and sit. Doing some shopping outings.   Pain level is a 2 - 3 down the back of her right thigh.  Was a 20 prior to suture removal.   Not taking iron.   GYNECOLOGIC HISTORY: Patient's last menstrual period was 12/24/2006 (approximate). Contraception: Hysterectomy  Menopausal hormone therapy: None Last mammogram: 01/25/15 BIRADS1:neg Last pap smear: 2014 Normal         OB History    Gravida Para Term Preterm AB TAB SAB Ectopic Multiple Living   3 2 2  1   1  2          Patient Active Problem List   Diagnosis Date Noted  . Status post surgery 08/16/2015  . Hydronephrosis, right 07/25/2015  . Adjustment disorder with anxious mood 11/01/2014  . DYSTHYMIC DISORDER 05/25/2009  . ACNE VULGARIS, FACIAL 05/25/2009  . SEBACEOUS CYST, INFECTED 04/26/2009  . DERMATITIS, ATOPIC 12/29/2008  . RESTLESS LEG SYNDROME 09/06/2008  . TRANSIENT DISORDER INITIATING/MAINTAINING SLEEP 07/23/2008  . COLONIC POLYPS, HYPERPLASTIC 05/15/2008  . HEMORRHOIDS, INTERNAL 05/15/2008  . UMBILICAL HERNIA 02/40/9735  . MIGRAINE HEADACHE 02/26/2008  . NEPHROLITHIASIS, HX OF 09/29/2007    Past Medical History  Diagnosis Date  . Anxiety   . Depression   . History of colon polyps     2008- BENIGN  . Left ureteral stone   . History of nephrolithiasis    . History of abnormal cervical Pap smear     1991 -- 1995  . Wears glasses   . GERD (gastroesophageal reflux disease)   . Frequency of urination   . Urgency of urination   . Microhematuria   . Hydronephrosis, left   . PONV (postoperative nausea and vomiting)   . Kidney stones 2016    Past Surgical History  Procedure Laterality Date  . Laparoscopic cholecystectomy  1999  . Tubal ligation Bilateral 1995  . Cystoscopy w/ ureteral stent placement  02/  2000  . Extracorporeal shock wave lithotripsy  2001  &  2002  . Dx laparoscopy  X2  . Exploratory laparotomy w/ bilateral salpingectomy and removal right ectopic preg.  02-23-2004  . Total abdominal hysterectomy w/ bilateral oophorectomy and lysis adhesions  09-02-2007  . Colonoscopy w/ polypectomy  05-30-2007  . Esophagogastroduodenoscopy  05-09-2007  . Umbilical hernia repair  2003  . Shoulder arthroscopy with open rotator cuff repair Right 2012  . Cystoscopy with ureteroscopy and stent placement Left 05/18/2015    Procedure: CYSTOSCOPY WITH URETEROSCOPY, [place];  Surgeon: Alexis Frock, MD;  Location: [facility];  Service: Urology;  Laterality: Left;  . Cystoscopy with holmium laser lithotripsy Left 05/18/2015    Procedure: CYSTOSCOPY WITH HOLMIUM LASER LITHOTRIPSY;  Surgeon: Alexis Frock, MD;  Location: [facility];  Service: Urology;  Laterality: Left;  . Cystoscopy with retrograde pyelogram, ureteroscopy and stent placement Left 06/22/2015    Procedure: CYSTOSCOPY,  LEFT URETEROSCOPY;  Surgeon: Cleon Gustin, MD;  Location: [facility];  Service: Urology;  Laterality: Left;  . Cystoscopy w/ retrogrades Bilateral 06/22/2015    Procedure: CYSTOSCOPY WITH RETROGRADE PYELOGRAM;  Surgeon: Cleon Gustin, MD;  Location: [facility];  Service: Urology;  Laterality: Bilateral;  . Abdominal hysterectomy    . Diagnostic laparoscopy    .  Anterior and posterior repair with sacrospinous fixation N/A 08/16/2015    Procedure: ANTERIOR COLPORRHAPHY WITH XENOFORM GRAFT AND SACROSPINOUS FIXATION;  Surgeon: Nunzio Cobbs, MD;  Location: [facility];  Service: Gynecology;  Laterality: N/A;  2.5 hours OR time  . Bladder suspension N/A 08/16/2015    Procedure: TRANSVAGINAL TAPE (TVT) PROCEDURE exact midurethral sling;  Surgeon: Nunzio Cobbs, MD;  Location: [facility];  Service: Gynecology;  Laterality: N/A;  . Cystoscopy N/A 08/16/2015    Procedure: CYSTOSCOPY;  Surgeon: Nunzio Cobbs, MD;  Location: [facility];  Service: Gynecology;  Laterality: N/A;  . Vaginal hysterectomy N/A 08/18/2015    Procedure: Exam under Anesthesia, Excision Xenform Graft, Removal Bilateral Sacrospinous Ligament sutures;  Surgeon: Nunzio Cobbs, MD;  Location: [facility];  Service: Gynecology;  Laterality: N/A;    Current Outpatient Prescriptions  Medication Sig Dispense Refill  . buPROPion (WELLBUTRIN XL) 150 MG 24 hr tablet TAKE 1 TABLET (150 MG TOTAL) BY MOUTH DAILY. 30 tablet 3  . ciprofloxacin (CIPRO) 250 MG tablet Take 1 tablet (250 mg total) by mouth daily with breakfast. 14 tablet 0  . diazepam (VALIUM) 2 MG tablet Take one tablet, 2 mg, by mouth at night time. 30 tablet 0  . FLUoxetine (PROZAC) 20 MG tablet Take 1 tablet (20 mg total) by mouth at bedtime. 30 tablet 3  . HYDROmorphone (DILAUDID) 2 MG tablet Take 1 tablet (2 mg total) by mouth every 3 (three) hours as needed for severe pain. 30 tablet 0  . ibuprofen (ADVIL,MOTRIN) 600 MG tablet Take 1 tablet (600 mg total) by mouth every 6 (six) hours as needed (mild pain). 30 tablet 0   No current facility-administered medications for this visit.     ALLERGIES: Desvenlafaxine and Iohexol  Family History  Problem Relation Age of Onset  . Hypertension Mother   . Diabetes Father   . Cancer Paternal Uncle     Pancreas  . Heart attack Maternal Grandmother   . Drug abuse Sister    . Colon cancer Neg Hx   . Esophageal cancer Neg Hx   . Stomach cancer Neg Hx   . Rectal cancer Neg Hx     Social History   Social History  . Marital Status: Married    Spouse Name: N/A  . Number of Children: N/A  . Years of Education: N/A   Occupational History  . Not on file.   Social History Main Topics  . Smoking status: Former Smoker -- 15 years    Types: Cigarettes    Quit date: 05/16/2008  . Smokeless tobacco: Never Used  . Alcohol Use: No  . Drug Use: No  . Sexual Activity:    Partners: Male    Birth Control/ Protection: Surgical     Comment: Hyst   Other Topics Concern  . Not on file   Social History Narrative    ROS:  Pertinent  items are noted in HPI.  PHYSICAL EXAMINATION:    BP 130/66 mmHg  Pulse 88  Temp(Src) 98.5 F (36.9 C) (Oral)  Ht 5' 5.5" (1.664 m)  Wt 218 lb (98.884 kg)  BMI 35.71 kg/m2  LMP 12/24/2006 (Approximate)    General appearance: alert, cooperative and appears stated age   Pelvic: External genitalia:  no lesions              Urethra:  normal appearing urethra with no masses, tenderness or lesions              Bartholins and Skenes: normal                 Vagina: normal appearing vagina with normal color and discharge, no lesions.  Sutures intact.  No pain with palpation of the bilateral ischial spines.               Cervix: absent   Bimanual Exam:  Uterus:  uterus absent              Adnexa: no mass, fullness, tenderness           Chaperone was present for exam.  ASSESSMENT  Dysuria. UTI likely.  Afebrile. Right sciatica.  Improving following removal of sacrospinous sutures.   PLAN  Counseled regarding sciatica as complication of nerve compression from the sacrospinous sutures.  I discussed my expectation for continued improvement of this.  Will referral to Ileana Roup for PT.  Discussed heat and ice for area and pillow for comfort. Send urine for culture.  Switch to Bactrim DS po bid for 7 days.  Pyridium 200 mg  po tid prn.  Will check CBC with diff today.  May need to start Fe.  Has not done this post op yet.  Follow up in 2 weeks.    An After Visit Summary was printed and given to the patient.

## 2015-09-02 ENCOUNTER — Telehealth: Payer: Self-pay | Admitting: *Deleted

## 2015-09-02 NOTE — Telephone Encounter (Signed)
Spoke with patient. Patient states is asking if she is now able to take a bath. Patient had surgery on 08/18/2015. Patient would like to know if it is okay for her to take a bath at this time. Advised patient with being 2 weeks post op should be okay to take a bath but I would like to speak with Dr.Silva due to pain and symptoms she has had post operatively. Patient is agreeable. Advised I will return call.

## 2015-09-02 NOTE — Telephone Encounter (Signed)
Spoke with patient. Advised I have spoken with Dr.Silva and it is okay for her to take baths at this time. Patient is agreeable.  Routing to Dr.Silva for review.

## 2015-09-02 NOTE — Telephone Encounter (Signed)
I did agree that a tub bath is OK for the patient.  Thank you for your assistance.  Encounter closed.

## 2015-09-02 NOTE — Telephone Encounter (Signed)
Patient called asking to speak with [facility], she has a question.

## 2015-09-03 LAB — URINE CULTURE: Colony Count: >=100000

## 2015-09-06 ENCOUNTER — Telehealth: Payer: Self-pay | Admitting: Obstetrics and Gynecology

## 2015-09-06 NOTE — Telephone Encounter (Addendum)
Ileana Roup, Physical Therapist from [facility] is calling to check with Dr. Quincy Simmonds about this patient. She said, "I want to make sure it is okay for me to do an internal vaginal exam for this patient since she had surgery about three weeks ago." The patient is being seen today and may be back later this week for another visit.

## 2015-09-06 NOTE — Telephone Encounter (Signed)
My feeling is no internal exam at this point.  Please convey that and will double check with Dr. Quincy Simmonds tomorrow.  CC:  Dr. Quincy Simmonds

## 2015-09-06 NOTE — Telephone Encounter (Signed)
Routing to [place] for review and advise as Dr.Silva is out of the office today. Patient is seeing Ileana Roup for right sided sciatica post surgery on  08/18/2015.

## 2015-09-06 NOTE — Telephone Encounter (Signed)
No internal exams at this point.  Thank you.

## 2015-09-06 NOTE — Telephone Encounter (Signed)
Spoke with Beth at [facility]. Message given as seen below from [place] as Ileana Roup is currently seeing a patient. Aware I will check with Dr.Silva tomorrow morning and return call with any additional recommendations.  Cc: Dr.Silva

## 2015-09-07 NOTE — Telephone Encounter (Signed)
Spoke with Ileana Roup. Advised of message as seen below from [place]. Ileana Roup is agreeable. States patient had her appointment with her yesterday and everything that was done was external only. [place] states she will continue with external exercises only until she hears from Dr.Silva that she may perform and internal exam.  Routing to provider for final review. Patient agreeable to disposition. Will close encounter.

## 2015-09-07 NOTE — Telephone Encounter (Signed)
Left message for [name] for return call to advised of message from [place].

## 2015-09-14 ENCOUNTER — Ambulatory Visit (INDEPENDENT_AMBULATORY_CARE_PROVIDER_SITE_OTHER): Payer: BLUE CROSS/BLUE SHIELD | Admitting: Obstetrics and Gynecology

## 2015-09-14 ENCOUNTER — Encounter: Payer: Self-pay | Admitting: Obstetrics and Gynecology

## 2015-09-14 VITALS — BP 116/74 | HR 80 | Ht 65.5 in | Wt 221.0 lb

## 2015-09-14 DIAGNOSIS — Z9889 Other specified postprocedural states: Secondary | ICD-10-CM

## 2015-09-14 DIAGNOSIS — R8290 Unspecified abnormal findings in urine: Secondary | ICD-10-CM

## 2015-09-14 NOTE — Progress Notes (Signed)
Patient ID: [name], female   DOB: September 19, 1966, 49 y.o.   MRN: [id number] GYNECOLOGY  VISIT   HPI: 49 y.o.   Married  Caucasian  female   [phone] with Patient's last menstrual period was 12/24/2006 (approximate).   here for 4 week follow up Exam under Anesthesia, Excision Xenform Graft, Removal Bilateral Sacrospinous Ligament sutures (N/A Vagina ).    Patient doesn't feel she is completely emptying bladder.  She states having discomfort when begins to urinate. Voiding often.   Scaitica comes and goes.  Feels like she is sitting on a rock.  Sensitivity of the skin and leg is decreased.   Still on Valium daily.  Diarrhea the last 3 days.  Green in color.  Had an accident with stool.  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   Urine dip - 1+ WBCs and 1+ RBCs clean catch specimen  GYNECOLOGIC HISTORY: Patient's last menstrual period was 12/24/2006 (approximate). Contraception:Hysterectomy Menopausal hormone therapy: none Last mammogram: 01-25-15 Density Cat.A/Neg: Last pap smear: 2014 Negative        OB History    Gravida Para Term Preterm AB TAB SAB Ectopic Multiple Living   _0 Patient Active Problem List   Diagnosis Date Noted  . Status post surgery 08/16/2015  . Hydronephrosis, right 07/25/2015  . Adjustment disorder with anxious mood 11/01/2014  . DYSTHYMIC DISORDER 05/25/2009  . ACNE VULGARIS, FACIAL 05/25/2009  . SEBACEOUS CYST, INFECTED 04/26/2009  . DERMATITIS, ATOPIC 12/29/2008  . RESTLESS LEG SYNDROME 09/06/2008  . TRANSIENT DISORDER INITIATING/MAINTAINING SLEEP 07/23/2008  . COLONIC POLYPS, HYPERPLASTIC 05/15/2008  . HEMORRHOIDS, INTERNAL 05/15/2008  . UMBILICAL HERNIA 94/70/9628  . MIGRAINE HEADACHE 02/26/2008  . NEPHROLITHIASIS, HX OF 09/29/2007    Past Medical History  Diagnosis Date  . Anxiety   . Depression   . History of colon polyps     2008- BENIGN  . Left  ureteral stone   . History of nephrolithiasis   . History of abnormal cervical Pap smear     1991 -- 1995  . Wears glasses   . GERD (gastroesophageal reflux disease)   . Frequency of urination   . Urgency of urination   . Microhematuria   . Hydronephrosis, left   . PONV (postoperative nausea and vomiting)   . Kidney stones 2016    Past Surgical History  Procedure Laterality Date  . Laparoscopic cholecystectomy  1999  . Tubal ligation Bilateral 1995  . Cystoscopy w/ ureteral stent placement  02/  2000  . Extracorporeal shock wave lithotripsy  2001  &  2002  . Dx laparoscopy  X2  . Exploratory laparotomy w/ bilateral salpingectomy and removal right ectopic preg.  02-23-2004  . Total abdominal hysterectomy w/ bilateral oophorectomy and lysis adhesions  09-02-2007  . Colonoscopy w/ polypectomy  05-30-2007  . Esophagogastroduodenoscopy  05-09-2007  . Umbilical hernia repair  2003  . Shoulder arthroscopy with open rotator cuff repair Right 2012  . Cystoscopy with ureteroscopy and stent placement Left 05/18/2015    Procedure: CYSTOSCOPY WITH URETEROSCOPY, [place];  Surgeon: Alexis Frock, MD;  Location: [facility];  Service: Urology;  Laterality: Left;  . Cystoscopy with holmium laser lithotripsy Left 05/18/2015    Procedure: CYSTOSCOPY WITH HOLMIUM LASER LITHOTRIPSY;  Surgeon: Alexis Frock, MD;  Location: [facility];  Service: Urology;  Laterality: Left;  . Cystoscopy with retrograde pyelogram, ureteroscopy and stent placement Left 06/22/2015    Procedure: CYSTOSCOPY,  LEFT URETEROSCOPY;  Surgeon: Cleon Gustin, MD;  Location[facility];  Service: Urology;  Laterality: Left;  . Cystoscopy w/ retrogrades Bilateral 06/22/2015    Procedure: CYSTOSCOPY WITH RETROGRADE PYELOGRAM;  Surgeon: Cleon Gustin, MD;  Location: [facility];  Service: Urology;  Laterality: Bilateral;  . Abdominal  hysterectomy    . Diagnostic laparoscopy    . Anterior and posterior repair with sacrospinous fixation N/A 08/16/2015    Procedure: ANTERIOR COLPORRHAPHY WITH XENOFORM GRAFT AND SACROSPINOUS FIXATION;  Surgeon: Nunzio Cobbs, MD;  Location: [facility];  Service: Gynecology;  Laterality: N/A;  2.5 hours OR time  . Bladder suspension N/A 08/16/2015    Procedure: TRANSVAGINAL TAPE (TVT) PROCEDURE exact midurethral sling;  Surgeon: Nunzio Cobbs, MD;  Location: [facility];  Service: Gynecology;  Laterality: N/A;  . Cystoscopy N/A 08/16/2015    Procedure: CYSTOSCOPY;  Surgeon: Nunzio Cobbs, MD;  Location: [facility];  Service: Gynecology;  Laterality: N/A;  . Vaginal hysterectomy N/A 08/18/2015    Procedure: Exam under Anesthesia, Excision Xenform Graft, Removal Bilateral Sacrospinous Ligament sutures;  Surgeon: Nunzio Cobbs, MD;  Location: [facility];  Service: Gynecology;  Laterality: N/A;    Current Outpatient Prescriptions  Medication Sig Dispense Refill  . buPROPion (WELLBUTRIN XL) 150 MG 24 hr tablet TAKE 1 TABLET (150 MG TOTAL) BY MOUTH DAILY. 30 tablet 3  . diazepam (VALIUM) 2 MG tablet Take one tablet, 2 mg, by mouth at night time. 30 tablet 0  . FLUoxetine (PROZAC) 20 MG tablet Take 1 tablet (20 mg total) by mouth at bedtime. 30 tablet 3  . HYDROmorphone (DILAUDID) 2 MG tablet Take 1 tablet (2 mg total) by mouth every 3 (three) hours as needed for severe pain. 30 tablet 0  . ibuprofen (ADVIL,MOTRIN) 600 MG tablet Take 1 tablet (600 mg total) by mouth every 6 (six) hours as needed (mild pain). 30 tablet 0   No current facility-administered medications for this visit.     ALLERGIES: Desvenlafaxine and Iohexol  Family History  Problem Relation Age of Onset  . Hypertension Mother   . Diabetes Father   . Cancer Paternal Uncle     Pancreas  . Heart attack Maternal Grandmother   . Drug abuse Sister   . Colon cancer Neg Hx   . Esophageal cancer Neg Hx   .  Stomach cancer Neg Hx   . Rectal cancer Neg Hx     Social History   Social History  . Marital Status: Married    Spouse Name: N/A  . Number of Children: N/A  . Years of Education: N/A   Occupational History  . Not on file.   Social History Main Topics  . Smoking status: Former Smoker -- 15 years    Types: Cigarettes    Quit date: 05/16/2008  . Smokeless tobacco: Never Used  . Alcohol Use: No  . Drug Use: No  . Sexual Activity:    Partners: Male    Birth Control/ Protection: Surgical     Comment: Hyst   Other Topics Concern  . Not on file   Social History Narrative    ROS:  Pertinent items are noted in HPI.  PHYSICAL EXAMINATION:    BP 116/74 mmHg  Pulse 80  Ht 5' 5.5" (1.664 m)  Wt 221  lb (100.245 kg)  BMI 36.20 kg/m2  LMP 12/24/2006 (Approximate)    General appearance: alert, cooperative and appears stated age   Pelvic: External genitalia:  no lesions              Urethra:  normal appearing urethra with no masses, tenderness or lesions              Bartholins and Skenes: normal                 Vagina: normal appearing vagina with normal color and discharge, no lesions.  First degree cystocele.  Suture line is starting to dissolve and loosen. Mild vaginal bleeding with contact with speculum.              Cervix: absent           Bimanual Exam:  Uterus:  uterus absent              Adnexa: no mass, fullness, tenderness               Bladder catheterization for PVR. Voided 100 cc.   Verbal consent for procedure.  Sterile prep with Hibiclens.  PRV with small cath kit - 75 cc.  Sent to lab for urine culture.   Chaperone was present for exam.  ASSESSMENT  Sciatic improving.  Cystocele.  Voiding adequately. Abnormal urine on clean catch specimen.  PLAN  Counseled regarding sciatica.  I expect these symptoms to resolve but will take time.  Continue PT with Wilda Young. I told patient that the trade off of removing the sacrospinous sutures and the  graft was less support to the bladder.   I am encouraging us to wait to see her final result from the prolapse surgery.   If she needs further surgery, I will refer her to another provider who does prolapse and incontinence surgery.  I shared this with her today.  Will check urine culture and treat as appropriate. Follow up in 3 weeks. No work until after next visit.  Nothing per vagina and no heavy exercise for 3 months post op.   Patient gave me a hug at the end of the visit today.  I told her I would get her where she needed to be with respect to satisfaction with her surgical outcome.   An After Visit Summary was printed and given to the patient.      

## 2015-09-16 LAB — URINE CULTURE
Colony Count: NO GROWTH
Organism ID, Bacteria: NO GROWTH

## 2015-09-19 ENCOUNTER — Encounter: Payer: Self-pay | Admitting: Obstetrics and Gynecology

## 2015-09-20 ENCOUNTER — Telehealth: Payer: Self-pay

## 2015-09-20 NOTE — Telephone Encounter (Signed)
Order written and to your desk for signature upon your return.

## 2015-09-20 NOTE — Telephone Encounter (Signed)
Indication for extension of disability is due to reoperation for removal of sacrospinous sutures and anterior colporrhaphy graft due to sciatica.   I am seeing the patient back for a recheck on 10/12/15 and will assess her for return to work at that time.   Thanks for writing the letter.

## 2015-09-20 NOTE — Telephone Encounter (Signed)
Routing mychart message as seen below to [place] for review. I will be happy to write letter. Indications for extension? Sciatica? Urinary discomfort?

## 2015-09-20 NOTE — Telephone Encounter (Signed)
Telephone message created regarding mychart encounter to Dr.Silva.

## 2015-09-20 NOTE — Telephone Encounter (Signed)
Non-Urgent Medical Question  Message [id number]   From  Nafisah Runions   To  Nunzio Cobbs, MD   Sent  09/19/2015 7:53 AM     Good morning  I have spoken to my employer  She needs and updated letter explaining I will need to extend my fmla. If you csn email it to me and I will get it to them. Looks like my next appointment is 10/19. As of yesterday my pain in leg/buttock is getting worse burning pain..I have therapy in morning I'll see what she says.  Thank you      Responsible Party    Pool - [facility] Clinical Pool No one has taken responsibility for this message.     No actions have been taken on this message.

## 2015-09-21 NOTE — Telephone Encounter (Signed)
Please see telephone encounter dated 09/20/2015.  Routing to provider for final review. Patient agreeable to disposition. Will close encounter.

## 2015-09-21 NOTE — Telephone Encounter (Signed)
Spoke with patient. Advised letter has been written and is ready for pick up. Patient is agreeable and will come by office today or tomorrow to pick up letter. Letter left at front for patient pick up.  Routing to provider for final review. Patient agreeable to disposition. Will close encounter.

## 2015-09-23 ENCOUNTER — Ambulatory Visit: Payer: BLUE CROSS/BLUE SHIELD | Admitting: Obstetrics and Gynecology

## 2015-09-26 ENCOUNTER — Ambulatory Visit: Payer: BLUE CROSS/BLUE SHIELD | Admitting: Obstetrics and Gynecology

## 2015-09-28 ENCOUNTER — Ambulatory Visit: Payer: BLUE CROSS/BLUE SHIELD | Admitting: Obstetrics and Gynecology

## 2015-10-12 ENCOUNTER — Encounter: Payer: Self-pay | Admitting: Obstetrics and Gynecology

## 2015-10-12 ENCOUNTER — Ambulatory Visit (INDEPENDENT_AMBULATORY_CARE_PROVIDER_SITE_OTHER): Payer: BLUE CROSS/BLUE SHIELD | Admitting: Obstetrics and Gynecology

## 2015-10-12 ENCOUNTER — Encounter: Payer: Self-pay | Admitting: Internal Medicine

## 2015-10-12 VITALS — BP 112/70 | HR 82 | Resp 14 | Ht 65.5 in | Wt 227.0 lb

## 2015-10-12 DIAGNOSIS — N3281 Overactive bladder: Secondary | ICD-10-CM | POA: Diagnosis not present

## 2015-10-12 DIAGNOSIS — Z9889 Other specified postprocedural states: Secondary | ICD-10-CM

## 2015-10-12 MED ORDER — BUPROPION HCL ER (XL) 300 MG PO TB24: 300.0000 mg | ORAL_TABLET | Freq: Every day | ORAL | Status: DC

## 2015-10-12 MED ORDER — OXYBUTYNIN CHLORIDE ER 10 MG PO TB24: 10.0000 mg | ORAL_TABLET | Freq: Every day | ORAL | Status: DC

## 2015-10-12 NOTE — Progress Notes (Signed)
GYNECOLOGY  VISIT   HPI: 49 y.o.   Married  Caucasian  female   [phone] with Patient's last menstrual period was 12/24/2006 (approximate).   here for   Post op from 08/16/15:ANTERIOR COLPORRHAPHY WITH XENOFORM GRAFT AND SACROSPINOUS FIXATION  TRANSVAGINAL TAPE (TVT) PROCEDURE exact midurethral sling    CYSTOSCOPY   08/18/15: Exam under Anesthesia, Excision Xenform Graft, Removal Bilateral Sacrospinous Ligament sutures  Husband is present for entire visit.   PT with Ileana Roup is going well.  Feeling of sitting on a rock is not there anymore.  Pain is gone.  Doing strength training now with Ileana Roup.  Has some urgency.  Having urinary incontinence. DF - varies.  NF - 2 - 3 times due to urge.  No leak with cough, laugh, or sneeze.   Took Ditropan XL from urologist in past when received care for nephrolithiasis.  Took for a period of time.   Does report a tingling in her arms when she voids.  No bladder pain.  GYNECOLOGIC HISTORY: Patient's last menstrual period was 12/24/2006 (approximate). Contraception:hysterectomy  Menopausal hormone therapy: none Last mammogram: 01/25/15 BIRADS1:neg Last pap smear: 2014 neg        OB History    Gravida Para Term Preterm AB TAB SAB Ectopic Multiple Living   3 2 2  1   1  2          Patient Active Problem List   Diagnosis Date Noted  . Status post surgery 08/16/2015  . Hydronephrosis, right 07/25/2015  . Adjustment disorder with anxious mood 11/01/2014  . DYSTHYMIC DISORDER 05/25/2009  . ACNE VULGARIS, FACIAL 05/25/2009  . SEBACEOUS CYST, INFECTED 04/26/2009  . DERMATITIS, ATOPIC 12/29/2008  . RESTLESS LEG SYNDROME 09/06/2008  . TRANSIENT DISORDER INITIATING/MAINTAINING SLEEP 07/23/2008  . COLONIC POLYPS, HYPERPLASTIC 05/15/2008  . HEMORRHOIDS, INTERNAL 05/15/2008  . UMBILICAL HERNIA 61/68/3729  . MIGRAINE HEADACHE 02/26/2008  . NEPHROLITHIASIS, HX OF 09/29/2007    Past Medical History  Diagnosis Date  . Anxiety   .  Depression   . History of colon polyps     2008- BENIGN  . Left ureteral stone   . History of nephrolithiasis   . History of abnormal cervical Pap smear     1991 -- 1995  . Wears glasses   . GERD (gastroesophageal reflux disease)   . Frequency of urination   . Urgency of urination   . Microhematuria   . Hydronephrosis, left   . PONV (postoperative nausea and vomiting)   . Kidney stones 2016    Past Surgical History  Procedure Laterality Date  . Laparoscopic cholecystectomy  1999  . Tubal ligation Bilateral 1995  . Cystoscopy w/ ureteral stent placement  02/  2000  . Extracorporeal shock wave lithotripsy  2001  &  2002  . Dx laparoscopy  X2  . Exploratory laparotomy w/ bilateral salpingectomy and removal right ectopic preg.  02-23-2004  . Total abdominal hysterectomy w/ bilateral oophorectomy and lysis adhesions  09-02-2007  . Colonoscopy w/ polypectomy  05-30-2007  . Esophagogastroduodenoscopy  05-09-2007  . Umbilical hernia repair  2003  . Shoulder arthroscopy with open rotator cuff repair Right 2012  . Cystoscopy with ureteroscopy and stent placement Left 05/18/2015    Procedure: CYSTOSCOPY WITH URETEROSCOPY, [place];  Surgeon: Alexis Frock, MD;  Location: [facility];  Service: Urology;  Laterality: Left;  . Cystoscopy with holmium laser lithotripsy Left 05/18/2015    Procedure: CYSTOSCOPY WITH HOLMIUM LASER LITHOTRIPSY;  Surgeon: Alexis Frock, MD;  Location: [facility];  Service: Urology;  Laterality: Left;  . Cystoscopy with retrograde pyelogram, ureteroscopy and stent placement Left 06/22/2015    Procedure: CYSTOSCOPY,  LEFT URETEROSCOPY;  Surgeon: Cleon Gustin, MD;  Location: [facility];  Service: Urology;  Laterality: Left;  . Cystoscopy w/ retrogrades Bilateral 06/22/2015    Procedure: CYSTOSCOPY WITH RETROGRADE PYELOGRAM;  Surgeon: Cleon Gustin, MD;  Location: [facility];  Service: Urology;  Laterality: Bilateral;  . Abdominal hysterectomy    . Diagnostic laparoscopy    . Anterior and posterior repair with sacrospinous fixation N/A 08/16/2015    Procedure: ANTERIOR COLPORRHAPHY WITH XENOFORM GRAFT AND SACROSPINOUS FIXATION;  Surgeon: Nunzio Cobbs, MD;  Location: [facility];  Service: Gynecology;  Laterality: N/A;  2.5 hours OR time  . Bladder suspension N/A 08/16/2015    Procedure: TRANSVAGINAL TAPE (TVT) PROCEDURE exact midurethral sling;  Surgeon: Nunzio Cobbs, MD;  Location: [facility];  Service: Gynecology;  Laterality: N/A;  . Cystoscopy N/A 08/16/2015    Procedure: CYSTOSCOPY;  Surgeon: Nunzio Cobbs, MD;  Location: [facility];  Service: Gynecology;  Laterality: N/A;  . Vaginal hysterectomy N/A 08/18/2015    Procedure: Exam under Anesthesia, Excision Xenform Graft, Removal Bilateral Sacrospinous Ligament sutures;  Surgeon: Nunzio Cobbs, MD;  Location: [facility];  Service: Gynecology;  Laterality: N/A;    Current Outpatient Prescriptions  Medication Sig Dispense Refill  . buPROPion (WELLBUTRIN XL) 150 MG 24 hr tablet TAKE 1 TABLET (150 MG TOTAL) BY MOUTH DAILY. 30 tablet 3  . diazepam (VALIUM) 2 MG tablet Take one tablet, 2 mg, by mouth at night time. 30 tablet 0  . FLUoxetine (PROZAC) 20 MG tablet Take 1 tablet (20 mg total) by mouth at bedtime. 30 tablet 3  . HYDROmorphone (DILAUDID) 2 MG tablet Take 1 tablet (2 mg total) by mouth every 3 (three) hours as needed for severe pain. 30 tablet 0  . ibuprofen (ADVIL,MOTRIN) 600 MG tablet Take 1 tablet (600 mg total) by mouth every 6 (six) hours as needed (mild pain). 30 tablet 0   No current facility-administered medications for this visit.     ALLERGIES: Desvenlafaxine and Iohexol  Family History  Problem Relation Age of Onset  . Hypertension Mother   . Diabetes Father   . Cancer Paternal Uncle     Pancreas  . Heart attack Maternal Grandmother   . Drug abuse  Sister   . Colon cancer Neg Hx   . Esophageal cancer Neg Hx   . Stomach cancer Neg Hx   . Rectal cancer Neg Hx     Social History   Social History  . Marital Status: Married    Spouse Name: N/A  . Number of Children: N/A  . Years of Education: N/A   Occupational History  . Not on file.   Social History Main Topics  . Smoking status: Former Smoker -- 15 years    Types: Cigarettes    Quit date: 05/16/2008  . Smokeless tobacco: Never Used  . Alcohol Use: No  . Drug Use: No  . Sexual Activity:    Partners: Male    Birth Control/ Protection: Surgical     Comment: Hyst   Other Topics Concern  . Not on file   Social History Narrative    ROS:  Pertinent items are noted in HPI.  PHYSICAL EXAMINATION:    LMP  12/24/2006 (Approximate)    General appearance: alert, cooperative and appears stated age  Pelvic: External genitalia:  no lesions              Urethra:  normal appearing urethra with no masses, tenderness or lesions              Bartholins and Skenes: normal                 Vagina: normal appearing vagina with normal color and discharge, no lesions.. A little more than first degree cystocele.  Nontender on exam.               Cervix: absent                Bimanual Exam:  Uterus:  uterus absent              Adnexa: no mass, fullness, tenderness               Chaperone was present for exam.  ASSESSMENT  Doing well status post op.  Pain is gone.  08/16/15:ANTERIOR COLPORRHAPHY WITH XENOFORM GRAFT AND SACROSPINOUS FIXATION  TRANSVAGINAL TAPE (TVT) PROCEDURE exact midurethral sling    CYSTOSCOPY   08/18/15: Exam under Anesthesia, Excision Xenform Graft, Removal Bilateral Sacrospinous Ligament sutures  Having overactive bladder symptoms.   PLAN  Discussion of surgical care reviewing first and second surgeries.  I apologized that the patient had the complication of sciatica and need for removal of the sutures and the graft.  She accepts this and expressed her  appreciation for her care.  We discussed that she has adequate vaginal length if she would need a procedure in the future to readdress prolapse.  She will start Ditropan XL 10 mg daily.  I discussed side effects of dry mouth, constipation, and urinary retention.  Discussed sugar free candy to promote salivary response and stool softeners and Miralax if needed. She has asked for a cath tip in case she has retention. I gave her one.    An After Visit Summary was printed and given to the patient.

## 2015-10-14 ENCOUNTER — Telehealth: Payer: Self-pay

## 2015-10-14 ENCOUNTER — Encounter: Payer: Self-pay | Admitting: Obstetrics and Gynecology

## 2015-10-14 ENCOUNTER — Ambulatory Visit (INDEPENDENT_AMBULATORY_CARE_PROVIDER_SITE_OTHER): Payer: BLUE CROSS/BLUE SHIELD | Admitting: Obstetrics and Gynecology

## 2015-10-14 VITALS — BP 124/80 | HR 80 | Temp 98.3°F | Ht 65.5 in | Wt 230.0 lb

## 2015-10-14 DIAGNOSIS — R350 Frequency of micturition: Secondary | ICD-10-CM | POA: Diagnosis not present

## 2015-10-14 LAB — POCT URINALYSIS DIPSTICK
Bilirubin, UA: NEGATIVE
Blood, UA: NEGATIVE
Glucose, UA: NEGATIVE
Ketones, UA: NEGATIVE
Leukocytes, UA: NEGATIVE
Nitrite, UA: NEGATIVE
Protein, UA: NEGATIVE
Urobilinogen, UA: NEGATIVE
pH, UA: 6.5

## 2015-10-14 NOTE — Telephone Encounter (Signed)
Telephone encounter created regarding mychart message.  

## 2015-10-14 NOTE — Telephone Encounter (Signed)
Patient returning your call. Best contact [phone]

## 2015-10-14 NOTE — Telephone Encounter (Signed)
Non-Urgent Medical Question  Message [id number]   From  Dawsyn Zurn   To  Nunzio Cobbs, MD   Sent  10/14/2015 11:47 AM     Good morning,   I started the diptran on Wednesday and I realize that it is going to take some time to work. My concern is i am voiding approximately every 15 minutes - with an high urgency to go. I am drinking my norm nothing more nothing less. Is there anything that i should be concern with - how long should the meds start to work?    Thanks,   Langley Gauss      Responsible Party    Pool - [facility] Clinical Pool No one has taken responsibility for this message.     No actions have been taken on this message.     Left message to call [place] at [phone].

## 2015-10-14 NOTE — Progress Notes (Signed)
Patient ID: [name], female   DOB: 06-23-1966, 49 y.o.   MRN: [id number]  Pt presented for urine dipstick.  Urinalysis was negative.  Pt states she started Ditropan on Wednesday (10/19).  Pt was advised to continue medication and follow up as directed by Dr. Quincy Simmonds at recent visit.  Pt agreeable and will call sooner if symptoms worsen.

## 2015-10-14 NOTE — Telephone Encounter (Signed)
Spoke with patient. Advised I have reviewed this mychart message with Dr.Silva. Dr.Silva recommends that she come in for urine check due to frequency of urination. Needs evaluation for UTI. Patient is agreeable. Placed on the schedule for today at 4 pm. Dr.Silva will need to see her before leaving in case she needs a prescription.  Routing to provider for final review. Patient agreeable to disposition. Will close encounter.

## 2015-10-15 LAB — URINE CULTURE: Colony Count: 50000

## 2015-10-17 ENCOUNTER — Encounter: Payer: Self-pay | Admitting: Obstetrics and Gynecology

## 2015-10-17 ENCOUNTER — Other Ambulatory Visit: Payer: Self-pay | Admitting: Obstetrics and Gynecology

## 2015-10-17 ENCOUNTER — Telehealth: Payer: Self-pay | Admitting: Emergency Medicine

## 2015-10-17 ENCOUNTER — Other Ambulatory Visit: Payer: BLUE CROSS/BLUE SHIELD

## 2015-10-17 DIAGNOSIS — R350 Frequency of micturition: Secondary | ICD-10-CM

## 2015-10-17 DIAGNOSIS — N3281 Overactive bladder: Secondary | ICD-10-CM

## 2015-10-17 MED ORDER — MIRABEGRON ER 25 MG PO TB24: 25.0000 mg | ORAL_TABLET | Freq: Every day | ORAL | Status: DC

## 2015-10-17 NOTE — Telephone Encounter (Signed)
Chief Complaint  Patient presents with  . Advice Only    Patient sent mychart message     ===View-only below this line===   ----- Message -----    From: [name]    Sent: 10/17/2015  2:03 PM EDT      To: Arloa Koh, MD Subject: Non-Urgent Medical Question  Hi Dr. Quincy Simmonds, I don't want to sound like a pest...but I am still have the urgency to go about every 10-20 minutes. sometime I am not even out of the bathroom smetimes it kicks right back in.  Also now i am not even warned when I have to go and I have to run or I am having an accident.  This is so crazy........and pressure and i feel like my whole stomach contracts when I go and I get the chills everytime I am done(that feeling goes away)  this is really interferring with my life...........I am sorry to dump this on you but I don't know what to do anymore...this has been such rocky road...........Marland Kitchenwe fix one issue and another one pops right up.  First I couldn't pee now I  can stop. I had two accidents over the weekend and when I was out I had to have [place] pull in places to go to the bathroom.  Getting up at night at least 4-5 times from the hours of 11:00 - 6:00.  Please advise what your suggestion might be

## 2015-10-17 NOTE — Telephone Encounter (Signed)
Call to patient after reviewing mychart message with Dr. [name] Heck.  Patient states that OAB symptoms are increasing and she is having increase need to void and dry mouth. She denies dysuria. Has contraction feelings at the end of her urine stream. No fevers, no back pain or chills.  Patient agreeable to recheck lab appointment for urinalysis ordered by Dr. [name] Heck with Urine micro and culture.  Orders from Dr. [name] Heck to DC Ditropan XL and start Myrbetriq 25 mg po q day. Patient agreeable to this change. To pick up savings card at lab appointment.  Lab appointment today.  Follow up with Dr. Quincy Simmonds 10/19/15 scheduled. Medication sent to [facility].  Routing to provider for final review. Patient agreeable to disposition. Will close encounter.  Cc Dr. Quincy Simmonds.

## 2015-10-17 NOTE — Telephone Encounter (Signed)
Telephone call for triage created to discuss message with patient and disposition as appropriate.   

## 2015-10-18 ENCOUNTER — Other Ambulatory Visit: Payer: Self-pay | Admitting: Obstetrics and Gynecology

## 2015-10-18 ENCOUNTER — Encounter: Payer: Self-pay | Admitting: Obstetrics and Gynecology

## 2015-10-18 LAB — URINALYSIS W MICROSCOPIC + REFLEX CULTURE
Bacteria, UA: NONE SEEN [HPF]
Bilirubin Urine: NEGATIVE
Casts: NONE SEEN [LPF]
Crystals: NONE SEEN [HPF]
Glucose, UA: NEGATIVE
Hgb urine dipstick: NEGATIVE
Ketones, ur: NEGATIVE
Leukocytes, UA: NEGATIVE
Nitrite: NEGATIVE
Protein, ur: NEGATIVE
RBC / HPF: NONE SEEN RBC/HPF (ref <=2)
Specific Gravity, Urine: 1.01 (ref 1.001–1.035)
WBC, UA: NONE SEEN WBC/HPF (ref <=5)
Yeast: NONE SEEN [HPF]
pH: 6.5 (ref 5.0–8.0)

## 2015-10-18 LAB — HEMOGLOBIN A1C
Hgb A1c MFr Bld: 5.6 % (ref <5.7)
Mean Plasma Glucose: 114 mg/dL (ref <117)

## 2015-10-18 MED ORDER — SULFAMETHOXAZOLE-TRIMETHOPRIM 800-160 MG PO TABS: ORAL_TABLET | ORAL | Status: DC

## 2015-10-18 NOTE — Progress Notes (Signed)
Encounter reviewed by Dr. Aundria Rud.  UC sent.

## 2015-10-19 ENCOUNTER — Ambulatory Visit: Payer: BLUE CROSS/BLUE SHIELD | Admitting: Obstetrics and Gynecology

## 2015-10-19 LAB — URINE CULTURE: Colony Count: 9000

## 2015-10-20 ENCOUNTER — Telehealth: Payer: Self-pay

## 2015-10-20 ENCOUNTER — Encounter: Payer: Self-pay | Admitting: Obstetrics and Gynecology

## 2015-10-20 NOTE — Telephone Encounter (Signed)
Left message to call [name] at [phone]. 

## 2015-10-20 NOTE — Telephone Encounter (Signed)
Non-Urgent Medical Question  Message [phone]   From  Masaye Gatchalian   To  Nunzio Cobbs, MD   Sent  10/20/2015 4:25 PM     Dr Quincy Simmonds,   I don't know if I should speak with your or my primary, but since I have working so close with you over the past two months i would mention to you first.   I have noticed since the weekend my left foot swelling. and I just looked at it today and it is really puffed. I did not hurt it and it doest matter what shoes I have one either it still swells.   Please advise the direction I should go in.     Thank you again   Langley Gauss      Responsible Party    Pool - [facility] Clinical Pool No one has taken responsibility for this message.     No actions have been taken on this message.     Left message to call [place] at [phone].

## 2015-10-20 NOTE — Telephone Encounter (Signed)
Have patient come in to see me tomorrow.  I may fit her for a pessary to see if lifting the bladder up will decrease her frequency.  Avoid all caffeine and beverages with carbination.   I have just sent her a message for her to see her PCP regarding the swelling.   We will check her blood pressure tomorrow to be sure she is not having elevated blood pressure from the Myrbetriq.

## 2015-10-20 NOTE — Telephone Encounter (Signed)
Spoke with patient. Advised of message as seen below from [place]. Patient is agreeable and verbalizes understanding. Patient is requesting an afternoon appointment tomorrow due to work schedule. Appointment scheduled for tomorrow 10/21/2015 at 3:15 pm with Dr.Silva. Patient is agreeable and verbalizes understanding. Will call her PCP to schedule an appointment regarding swelling.  Routing to provider for final review. Patient agreeable to disposition. Will close encounter.

## 2015-10-20 NOTE — Telephone Encounter (Signed)
Spoke with patient in regards to [name] as seen below. Patient states that over the last week she has been experiencing swelling in both of her feet. Feet are sore. "It does not matter what shoes I wear. Every time I take my shoes off my feet are swollen. I also feel that my arms are swelling." Denies any shortness of breath or chest pain. States she is also still using the restroom every 20 minutes. Is currently taking Bactrim DS. Advised due to swelling in extremities I recommend she schedule an appointment with her PCP for further evaluation. Patient is agreeable. Advised I will speak with Dr.Silva regarding symptoms and recommendations and return call. Patient is agreeable.

## 2015-10-21 ENCOUNTER — Encounter: Payer: Self-pay | Admitting: Family Medicine

## 2015-10-21 ENCOUNTER — Ambulatory Visit (INDEPENDENT_AMBULATORY_CARE_PROVIDER_SITE_OTHER): Payer: BLUE CROSS/BLUE SHIELD | Admitting: Family Medicine

## 2015-10-21 ENCOUNTER — Ambulatory Visit (INDEPENDENT_AMBULATORY_CARE_PROVIDER_SITE_OTHER): Payer: BLUE CROSS/BLUE SHIELD | Admitting: Obstetrics and Gynecology

## 2015-10-21 ENCOUNTER — Encounter: Payer: Self-pay | Admitting: Obstetrics and Gynecology

## 2015-10-21 VITALS — BP 122/66 | HR 80 | Resp 20 | Ht 65.5 in | Wt 231.6 lb

## 2015-10-21 VITALS — BP 112/74 | HR 76 | Temp 97.7°F | Ht 65.5 in | Wt 230.1 lb

## 2015-10-21 DIAGNOSIS — R600 Localized edema: Secondary | ICD-10-CM | POA: Diagnosis not present

## 2015-10-21 DIAGNOSIS — R350 Frequency of micturition: Secondary | ICD-10-CM | POA: Diagnosis not present

## 2015-10-21 LAB — POCT URINALYSIS DIPSTICK
Bilirubin, UA: NEGATIVE
Blood, UA: NEGATIVE
Glucose, UA: NEGATIVE
Ketones, UA: NEGATIVE
Leukocytes, UA: NEGATIVE
Nitrite, UA: NEGATIVE
Protein, UA: NEGATIVE
Urobilinogen, UA: NEGATIVE
pH, UA: 5

## 2015-10-21 NOTE — Patient Instructions (Signed)
Your exam was unremarkable.  I suspect this is from venous insufficiency.  Follow up closely with your PCP.  Take care  Dr. Lacinda Axon

## 2015-10-21 NOTE — Progress Notes (Signed)
Patient ID: [name], female   DOB: [birth year], 49 y.o.   MRN: [id number] GYNECOLOGY  VISIT   HPI: 49 y.o.   Married  Caucasian  female   [phone] with Patient's last menstrual period was 12/24/2006 (approximate).   here for evaluation of urinary frequency.  Urgency started around 10/12/15.  Tried Ditropan XL for a very short period of time.  Frequency continued and so switched to Mybetriq.  Taken this only for about 4 days.  No real change 2 urine cultures - first was 50,00 mixed colonies.  Second UC showed 9000 colonies.  Was treated with Bactrim, which did not make a difference.   No new beverages in her routine.  Went up on Wellbutrin dosage with hopes to wean off on the Prozac.   Urine Dip: Negative.   GYNECOLOGIC HISTORY: Patient's last menstrual period was 12/24/2006 (approximate). Contraception:Hysterectomy Menopausal hormone therapy:  None. Last mammogram: 01-25-15 Density Cat.A/Neg/BiRads1:[facility]. Last pap smear: 2014 Neg        OB History    Gravida Para Term Preterm AB TAB SAB Ectopic Multiple Living   3 2 2  1   1  2          Patient Active Problem List   Diagnosis Date Noted  . Localized edema 10/21/2015  . Status post surgery 08/16/2015  . Hydronephrosis, right 07/25/2015  . Adjustment disorder with anxious mood 11/01/2014  . DYSTHYMIC DISORDER 05/25/2009  . ACNE VULGARIS, FACIAL 05/25/2009  . SEBACEOUS CYST, INFECTED 04/26/2009  . DERMATITIS, ATOPIC 12/29/2008  . RESTLESS LEG SYNDROME 09/06/2008  . TRANSIENT DISORDER INITIATING/MAINTAINING SLEEP 07/23/2008  . COLONIC POLYPS, HYPERPLASTIC 05/15/2008  . HEMORRHOIDS, INTERNAL 05/15/2008  . UMBILICAL HERNIA 82/42/3536  . MIGRAINE HEADACHE 02/26/2008  . NEPHROLITHIASIS, HX OF 09/29/2007    Past Medical History  Diagnosis Date  . Anxiety   . Depression   . History of colon polyps     2008- BENIGN  . Left ureteral stone   . History of nephrolithiasis   . History of abnormal cervical Pap  smear     1991 -- 1995  . Wears glasses   . GERD (gastroesophageal reflux disease)   . Frequency of urination   . Urgency of urination   . Microhematuria   . Hydronephrosis, left   . PONV (postoperative nausea and vomiting)   . Kidney stones 2016    Past Surgical History  Procedure Laterality Date  . Laparoscopic cholecystectomy  1999  . Tubal ligation Bilateral 1995  . Cystoscopy w/ ureteral stent placement  02/  2000  . Extracorporeal shock wave lithotripsy  2001  &  2002  . Dx laparoscopy  X2  . Exploratory laparotomy w/ bilateral salpingectomy and removal right ectopic preg.  02-23-2004  . Total abdominal hysterectomy w/ bilateral oophorectomy and lysis adhesions  09-02-2007  . Colonoscopy w/ polypectomy  05-30-2007  . Esophagogastroduodenoscopy  05-09-2007  . Umbilical hernia repair  2003  . Shoulder arthroscopy with open rotator cuff repair Right 2012  . Cystoscopy with ureteroscopy and stent placement Left 05/18/2015    Procedure: CYSTOSCOPY WITH URETEROSCOPY, [place];  Surgeon: Alexis Frock, MD;  Location: [facility];  Service: Urology;  Laterality: Left;  . Cystoscopy with holmium laser lithotripsy Left 05/18/2015    Procedure: CYSTOSCOPY WITH HOLMIUM LASER LITHOTRIPSY;  Surgeon: Alexis Frock, MD;  Location: [facility];  Service: Urology;  Laterality: Left;  . Cystoscopy with retrograde pyelogram, ureteroscopy and stent placement Left  06/22/2015    Procedure: CYSTOSCOPY,  LEFT URETEROSCOPY;  Surgeon: Cleon Gustin, MD;  Location: [facility];  Service: Urology;  Laterality: Left;  . Cystoscopy w/ retrogrades Bilateral 06/22/2015    Procedure: CYSTOSCOPY WITH RETROGRADE PYELOGRAM;  Surgeon: Cleon Gustin, MD;  Location: [facility];  Service: Urology;  Laterality: Bilateral;  . Abdominal hysterectomy    . Diagnostic laparoscopy    . Anterior and posterior repair with  sacrospinous fixation N/A 08/16/2015    Procedure: ANTERIOR COLPORRHAPHY WITH XENOFORM GRAFT AND SACROSPINOUS FIXATION;  Surgeon: Nunzio Cobbs, MD;  Location: [facility];  Service: Gynecology;  Laterality: N/A;  2.5 hours OR time  . Bladder suspension N/A 08/16/2015    Procedure: TRANSVAGINAL TAPE (TVT) PROCEDURE exact midurethral sling;  Surgeon: Nunzio Cobbs, MD;  Location: [facility];  Service: Gynecology;  Laterality: N/A;  . Cystoscopy N/A 08/16/2015    Procedure: CYSTOSCOPY;  Surgeon: Nunzio Cobbs, MD;  Location: [facility];  Service: Gynecology;  Laterality: N/A;  . Vaginal hysterectomy N/A 08/18/2015    Procedure: Exam under Anesthesia, Excision Xenform Graft, Removal Bilateral Sacrospinous Ligament sutures;  Surgeon: Nunzio Cobbs, MD;  Location: [facility];  Service: Gynecology;  Laterality: N/A;    Current Outpatient Prescriptions  Medication Sig Dispense Refill  . buPROPion (WELLBUTRIN XL) 300 MG 24 hr tablet Take 1 tablet (300 mg total) by mouth daily. 30 tablet 2  . FLUoxetine (PROZAC) 20 MG tablet Take 1 tablet (20 mg total) by mouth at bedtime. 30 tablet 3  . ibuprofen (ADVIL,MOTRIN) 600 MG tablet Take 1 tablet (600 mg total) by mouth every 6 (six) hours as needed (mild pain). 30 tablet 0  . mirabegron ER (MYRBETRIQ) 25 MG TB24 tablet Take 1 tablet (25 mg total) by mouth daily. 30 tablet 0  . sulfamethoxazole-trimethoprim (BACTRIM DS) 800-160 MG tablet Take one tablet twice a day for one week. 14 tablet 0   No current facility-administered medications for this visit.     ALLERGIES: Desvenlafaxine and Iohexol  Family History  Problem Relation Age of Onset  . Hypertension Mother   . Diabetes Father   . Cancer Paternal Uncle     Pancreas  . Heart attack Maternal Grandmother   . Drug abuse Sister   . Colon cancer Neg Hx   . Esophageal cancer Neg Hx   . Stomach cancer Neg Hx   . Rectal cancer Neg Hx     Social History   Social History  .  Marital Status: Married    Spouse Name: N/A  . Number of Children: N/A  . Years of Education: N/A   Occupational History  . Not on file.   Social History Main Topics  . Smoking status: Former Smoker -- 15 years    Types: Cigarettes    Quit date: 05/16/2008  . Smokeless tobacco: Never Used  . Alcohol Use: No  . Drug Use: No  . Sexual Activity:    Partners: Male    Birth Control/ Protection: Surgical     Comment: Hyst   Other Topics Concern  . Not on file   Social History Narrative    ROS:  Pertinent items are noted in HPI.  PHYSICAL EXAMINATION:    BP 122/66 mmHg  Pulse 80  Resp 20  Ht 5' 5.5" (1.664 m)  Wt 231 lb 9.6 oz (105.053 kg)  BMI 37.94 kg/m2  LMP 12/24/2006 (Approximate)  General appearance: alert, cooperative and appears stated age  Pelvic: External genitalia:  no lesions              Urethra:  normal appearing urethra with no masses, tenderness or lesions              Bartholins and Skenes: normal                 Vagina: normal appearing vagina with normal color and discharge, no lesions.  Cystocele - between first and second degree cystocele.  Firm stool palpated on bimanual exam.              Cervix: absent              Bimanual Exam:  Uterus:  uterus absent              Adnexa: normal adnexa and no mass, fullness, tenderness              Rectovaginal: no.  Attempt to fit a pessary ring with support was not successful due to patient discomfort.  Chaperone was present for exam.  ASSESSMENT  Overactive bladder versus incomplete emptying.  I believe there are multiple factors causing this at this time.  Status post midurethral sling.  Cystocele. Constipation.  Wellbutrin use.   PLAN  Counseled regarding cystocele.  I have been clear with patient that removal of her graft and the sacrospinous sutures resulted in a some recurrence of her prolapse. Continue Myrbetriq.  If no improvement, will consider urispas, 100 - 200 mg po tid-qid.  I  discussed that increasing the Wellbutrin may be contributing to the urinary frequency.  Patient and I reviewed side effects of Wellbutrin together in [facility]. I am recommending a good bowel regimen to help with the constipation - Colace daily.  I have reviewed bladder irritants with patient.  I am recommending that the patient try Poise Impressa, which may lift the bladder back up and improve bladder functioning.   An After Visit Summary was printed and given to the patient.  ___25___ minutes face to face time of which over 50% was spent in counseling.

## 2015-10-21 NOTE — Progress Notes (Signed)
Pre visit review using our clinic review tool, if applicable. No additional management support is needed unless otherwise documented below in the visit note. 

## 2015-10-21 NOTE — Assessment & Plan Note (Signed)
Patient with localized swelling of the left foot. No evidence of DVT, CHF on exam. No recent trauma, fall, injury. I suspect that this is going to self resolve. May be from underlying venous stasis. Advised close follow-up with PCP.

## 2015-10-21 NOTE — Progress Notes (Signed)
   Subjective:  Patient ID: [name], female    DOB: 03-14-1966  Age: 49 y.o. MRN: [id number]  CC: Left foot swelling  HPI:  49 year old female without any past medical history presents to the clinic today with complaints of left foot swelling.  Left foot swelling  For the past week.  No known inciting factor. No recent fall, trauma, injury.  No associated shortness of breath.  Patient has had recent surgery.  No changes in diet.  No other associated symptoms.   Social Hx   Social History   Social History  . Marital Status: Married    Spouse Name: N/A  . Number of Children: N/A  . Years of Education: N/A   Social History Main Topics  . Smoking status: Former Smoker -- 15 years    Types: Cigarettes    Quit date: 05/16/2008  . Smokeless tobacco: Never Used  . Alcohol Use: No  . Drug Use: No  . Sexual Activity:    Partners: Male    Birth Control/ Protection: Surgical     Comment: Hyst   Other Topics Concern  . None   Social History Narrative   Review of Systems  Constitutional: Negative.   Respiratory: Negative for shortness of breath.    Objective:  BP 112/74 mmHg  Pulse 76  Temp(Src) 97.7 F (36.5 C) (Oral)  Ht 5' 5.5" (1.664 m)  Wt 230 lb 2 oz (104.384 kg)  BMI 37.70 kg/m2  SpO2 99%  LMP 12/24/2006 (Approximate)  BP/Weight 10/21/2015 10/14/2015 40/98/1191  Systolic BP 478 295 621  Diastolic BP 74 80 70  Wt. (Lbs) 230.13 230 227  BMI 37.7 37.68 37.19   Physical Exam  Constitutional: She appears well-developed and well-nourished. No distress.  Cardiovascular: Normal rate and regular rhythm.   Pulmonary/Chest: Effort normal. No respiratory distress. She has no wheezes. She has no rales.  Musculoskeletal:  Left foot and ankle - normal range of motion of the ankle. Trace edema noted on the dorsum of the foot. No Pain or erythema. Negative Homans sign.  Neurological: She is alert.  Psychiatric: She has a normal mood and affect.  Vitals  reviewed.  Assessment & Plan:   Problem List Items Addressed This Visit    Localized edema - Primary    Patient with localized swelling of the left foot. No evidence of DVT, CHF on exam. No recent trauma, fall, injury. I suspect that this is going to self resolve. May be from underlying venous stasis. Advised close follow-up with PCP.         Follow-up: PRN  Thersa Salt, DO

## 2015-10-24 ENCOUNTER — Encounter: Payer: Self-pay | Admitting: Obstetrics and Gynecology

## 2015-11-12 ENCOUNTER — Other Ambulatory Visit: Payer: Self-pay | Admitting: Obstetrics & Gynecology

## 2015-11-14 NOTE — Telephone Encounter (Signed)
Medication refill request: Myrbetriq  Last AEX:  12/20/2014 BS Next AEX: No appt. Scheduled  Last MMG (if hormonal medication request): 01/25/15 BI-RADS Category 1, Negative Refill authorized: 10/17/15 #30 Tablets, 0 Refills

## 2015-11-21 ENCOUNTER — Encounter: Payer: Self-pay | Admitting: Internal Medicine

## 2015-11-23 ENCOUNTER — Encounter: Payer: Self-pay | Admitting: Obstetrics and Gynecology

## 2015-11-23 ENCOUNTER — Ambulatory Visit (INDEPENDENT_AMBULATORY_CARE_PROVIDER_SITE_OTHER): Payer: BLUE CROSS/BLUE SHIELD | Admitting: Obstetrics and Gynecology

## 2015-11-23 VITALS — BP 114/70 | HR 72 | Resp 16 | Ht 65.5 in | Wt 235.0 lb

## 2015-11-23 DIAGNOSIS — N8110 Cystocele, unspecified: Secondary | ICD-10-CM

## 2015-11-23 DIAGNOSIS — N3281 Overactive bladder: Secondary | ICD-10-CM

## 2015-11-23 DIAGNOSIS — IMO0002 Reserved for concepts with insufficient information to code with codable children: Secondary | ICD-10-CM

## 2015-11-23 MED ORDER — MIRABEGRON ER 50 MG PO TB24: 50.0000 mg | ORAL_TABLET | Freq: Every day | ORAL | Status: DC

## 2015-11-23 NOTE — Progress Notes (Signed)
GYNECOLOGY  VISIT   HPI: 49 y.o.   Married  Caucasian  female   [phone] with Patient's last menstrual period was 12/24/2006 (approximate).   here for post op visit.  Voiding interval is 1.5 - 2 hours.  On Myrbetriq. Is having some tingling in her left hand when she needs to void.  No pain or discomfort.  Had one episode of leakage with urgency.  Doing physical therapy and this is helping the urgency.  No leakage or urine with cough, laugh, or sneeze.  Would like to increase the Myrbetriq.   Bowel movements are normal.    GYNECOLOGIC HISTORY: Patient's last menstrual period was 12/24/2006 (approximate). Contraception:hysterectomy Menopausal hormone therapy: none Last mammogram: 01-24-15 category a density,birads 1:neg Last pap smear: 7/14 neg per patient        OB History    Gravida Para Term Preterm AB TAB SAB Ectopic Multiple Living   3 2 2  1   1  2          Patient Active Problem List   Diagnosis Date Noted  . Localized edema 10/21/2015  . Status post surgery 08/16/2015  . Hydronephrosis, right 07/25/2015  . Adjustment disorder with anxious mood 11/01/2014  . DYSTHYMIC DISORDER 05/25/2009  . ACNE VULGARIS, FACIAL 05/25/2009  . SEBACEOUS CYST, INFECTED 04/26/2009  . DERMATITIS, ATOPIC 12/29/2008  . RESTLESS LEG SYNDROME 09/06/2008  . TRANSIENT DISORDER INITIATING/MAINTAINING SLEEP 07/23/2008  . COLONIC POLYPS, HYPERPLASTIC 05/15/2008  . HEMORRHOIDS, INTERNAL 05/15/2008  . UMBILICAL HERNIA [id number]  . MIGRAINE HEADACHE 02/26/2008  . NEPHROLITHIASIS, HX OF 09/29/2007    Past Medical History  Diagnosis Date  . Anxiety   . Depression   . History of colon polyps     2008- BENIGN  . Left ureteral stone   . History of nephrolithiasis   . History of abnormal cervical Pap smear     1991 -- 1995  . Wears glasses   . GERD (gastroesophageal reflux disease)   . Frequency of urination   . Urgency of urination   . Microhematuria   . Hydronephrosis, left   . PONV  (postoperative nausea and vomiting)   . Kidney stones 2016    Past Surgical History  Procedure Laterality Date  . Laparoscopic cholecystectomy  1999  . Tubal ligation Bilateral 1995  . Cystoscopy w/ ureteral stent placement  02/  2000  . Extracorporeal shock wave lithotripsy  2001  &  2002  . Dx laparoscopy  X2  . Exploratory laparotomy w/ bilateral salpingectomy and removal right ectopic preg.  02-23-2004  . Total abdominal hysterectomy w/ bilateral oophorectomy and lysis adhesions  09-02-2007  . Colonoscopy w/ polypectomy  05-30-2007  . Esophagogastroduodenoscopy  05-09-2007  . Umbilical hernia repair  2003  . Shoulder arthroscopy with open rotator cuff repair Right 2012  . Cystoscopy with ureteroscopy and stent placement Left 05/18/2015    Procedure: CYSTOSCOPY WITH URETEROSCOPY, [place];  Surgeon: Alexis Frock, MD;  Location: [facility];  Service: Urology;  Laterality: Left;  . Cystoscopy with holmium laser lithotripsy Left 05/18/2015    Procedure: CYSTOSCOPY WITH HOLMIUM LASER LITHOTRIPSY;  Surgeon: Alexis Frock, MD;  Location: [facility];  Service: Urology;  Laterality: Left;  . Cystoscopy with retrograde pyelogram, ureteroscopy and stent placement Left 06/22/2015    Procedure: CYSTOSCOPY,  LEFT URETEROSCOPY;  Surgeon: Cleon Gustin, MD;  Location: [facility];  Service: Urology;  Laterality: Left;  . Cystoscopy w/ retrogrades Bilateral  06/22/2015    Procedure: CYSTOSCOPY WITH RETROGRADE PYELOGRAM;  Surgeon: Cleon Gustin, MD;  Location: [facility];  Service: Urology;  Laterality: Bilateral;  . Abdominal hysterectomy    . Diagnostic laparoscopy    . Anterior and posterior repair with sacrospinous fixation N/A 08/16/2015    Procedure: ANTERIOR COLPORRHAPHY WITH XENOFORM GRAFT AND SACROSPINOUS FIXATION;  Surgeon: Nunzio Cobbs, MD;  Location: [facility];  Service: Gynecology;   Laterality: N/A;  2.5 hours OR time  . Bladder suspension N/A 08/16/2015    Procedure: TRANSVAGINAL TAPE (TVT) PROCEDURE exact midurethral sling;  Surgeon: Nunzio Cobbs, MD;  Location: [facility];  Service: Gynecology;  Laterality: N/A;  . Cystoscopy N/A 08/16/2015    Procedure: CYSTOSCOPY;  Surgeon: Nunzio Cobbs, MD;  Location: [facility];  Service: Gynecology;  Laterality: N/A;  . Vaginal hysterectomy N/A 08/18/2015    Procedure: Exam under Anesthesia, Excision Xenform Graft, Removal Bilateral Sacrospinous Ligament sutures;  Surgeon: Nunzio Cobbs, MD;  Location: [facility];  Service: Gynecology;  Laterality: N/A;    Current Outpatient Prescriptions  Medication Sig Dispense Refill  . buPROPion (WELLBUTRIN XL) 300 MG 24 hr tablet Take 1 tablet (300 mg total) by mouth daily. 30 tablet 2  . MYRBETRIQ 25 MG TB24 tablet TAKE 1 TABLET EVERY DAY 30 tablet 0   No current facility-administered medications for this visit.     ALLERGIES: Desvenlafaxine and Iohexol  Family History  Problem Relation Age of Onset  . Hypertension Mother   . Diabetes Father   . Cancer Paternal Uncle     Pancreas  . Heart attack Maternal Grandmother   . Drug abuse Sister   . Colon cancer Neg Hx   . Esophageal cancer Neg Hx   . Stomach cancer Neg Hx   . Rectal cancer Neg Hx     Social History   Social History  . Marital Status: Married    Spouse Name: N/A  . Number of Children: N/A  . Years of Education: N/A   Occupational History  . Not on file.   Social History Main Topics  . Smoking status: Former Smoker -- 15 years    Types: Cigarettes    Quit date: 05/16/2008  . Smokeless tobacco: Never Used  . Alcohol Use: No  . Drug Use: No  . Sexual Activity:    Partners: Male    Birth Control/ Protection: Surgical     Comment: Hyst   Other Topics Concern  . Not on file   Social History Narrative    ROS:  Pertinent items are noted in HPI.  PHYSICAL EXAMINATION:    BP  114/70 mmHg  Pulse 72  Resp 16  Ht 5' 5.5" (1.664 m)  Wt 235 lb (106.595 kg)  BMI 38.50 kg/m2  LMP 12/24/2006 (Approximate)    General appearance: alert, cooperative and appears stated age  Pelvic: External genitalia:  no lesions              Urethra:  normal appearing urethra with no masses, tenderness or lesions              Bartholins and Skenes: normal                 Vagina: normal appearing vagina with normal color and discharge, no lesions.  Has second degree cystocele.              Cervix: absent  Bimanual Exam:  Uterus:  uterus absent              Adnexa: no mass, fullness, tenderness               Chaperone was present for exam.  ASSESSMENT  Overactive bladder syndrome.  Status post removal of sacrospinous sutures and biological graft.  Status post midurethral sling.   I think the patient has a hinging effect on the urethra.   Recurrent cystocele.  Apex seems reasonably supported.  PLAN  Increase Myrbetriq to 50 mg daily.  Increase activity to normal.  Avoid extreme lifting.  Return for annual exam and recheck in 6 weeks.  Has adequate vaginal tissue if needs a further reconstruction for prolapse.   An After Visit Summary was printed and given to the patient.

## 2016-01-02 ENCOUNTER — Encounter: Payer: BLUE CROSS/BLUE SHIELD | Admitting: Internal Medicine

## 2016-01-06 ENCOUNTER — Encounter: Payer: Self-pay | Admitting: Obstetrics and Gynecology

## 2016-01-06 ENCOUNTER — Ambulatory Visit (INDEPENDENT_AMBULATORY_CARE_PROVIDER_SITE_OTHER): Payer: BLUE CROSS/BLUE SHIELD | Admitting: Obstetrics and Gynecology

## 2016-01-06 VITALS — BP 138/84 | HR 78 | Resp 18 | Ht 65.75 in | Wt 240.0 lb

## 2016-01-06 DIAGNOSIS — N952 Postmenopausal atrophic vaginitis: Secondary | ICD-10-CM

## 2016-01-06 DIAGNOSIS — Z01419 Encounter for gynecological examination (general) (routine) without abnormal findings: Secondary | ICD-10-CM | POA: Diagnosis not present

## 2016-01-06 DIAGNOSIS — N3281 Overactive bladder: Secondary | ICD-10-CM | POA: Diagnosis not present

## 2016-01-06 LAB — POCT URINALYSIS DIPSTICK
Bilirubin, UA: NEGATIVE
Blood, UA: NEGATIVE
Glucose, UA: NEGATIVE
Ketones, UA: NEGATIVE
Leukocytes, UA: NEGATIVE
Nitrite, UA: NEGATIVE
Protein, UA: NEGATIVE
Urobilinogen, UA: NEGATIVE
pH, UA: 5

## 2016-01-06 MED ORDER — ESTROGENS, CONJUGATED 0.625 MG/GM VA CREA: TOPICAL_CREAM | VAGINAL | Status: DC

## 2016-01-06 MED ORDER — MIRABEGRON ER 50 MG PO TB24: 50.0000 mg | ORAL_TABLET | Freq: Every day | ORAL | Status: DC

## 2016-01-06 NOTE — Progress Notes (Signed)
50 y.o. [id number] MarriedCaucasian female here for annual exam.    Patient is on Myrbetriq, but stopped a week ago.  Back to a more normal voiding interval of about 3 - 4 hours.  Has some increased frequency the more she taken in fluid.  When voids, has left arm/finger tingling. Discomfort is livable.  Voiding well.  Is doing PT on her own and is not seeing Ileana Roup.   No more right buttock pain.   BMS are normal.   No problems with sexual activity other than vaginal dryness.   Does not feel the same vaginal protrusion or the "ball" that she had before she had her prolapse surgery.  Has returned to most usual activity except going to the gym.  Wants to loose weight.   Not inclined to do any future surgery.   PCP:   Marko Plume  Patient's last menstrual period was 12/24/2006 (approximate).           Patient has had a hysterectomy.  Sexually active: Yes.  female The current method of family planning is status post hysterectomy.  Exercising: Yes.  weight training and cardio. Smoker: no  Health Maintenance: Pap: 06/2013 normal per patient History of abnormal Pap: Yes - 22 years ago.  MMG: 01/24/2015 wnl Colonoscopy: 05-30-07 adenomatous polyps with Dr. Oretha Caprice at [facility]. Next colonoscopy was due 05/2012. Patient to call and schedule.  BMD: --- TDaP: 01-06-14  Screening Labs: PCP, Hb today: PCP, Urine today: Neg   reports that she quit smoking about 7 years ago. Her smoking use included Cigarettes. She quit after 15 years of use. She has never used smokeless tobacco. She reports that she does not drink alcohol or use illicit drugs.  Past Medical History  Diagnosis Date  . Anxiety   . Depression   . History of colon polyps     2008- BENIGN  . Left ureteral stone   . History of nephrolithiasis   . History of abnormal cervical Pap smear     1991 -- 1995  . Wears glasses   . GERD (gastroesophageal reflux disease)   . Frequency of urination   .  Urgency of urination   . Microhematuria   . Hydronephrosis, left   . PONV (postoperative nausea and vomiting)   . Kidney stones 2016    Past Surgical History  Procedure Laterality Date  . Laparoscopic cholecystectomy  1999  . Tubal ligation Bilateral 1995  . Cystoscopy w/ ureteral stent placement  02/  2000  . Extracorporeal shock wave lithotripsy  2001  &  2002  . Dx laparoscopy  X2  . Exploratory laparotomy w/ bilateral salpingectomy and removal right ectopic preg.  02-23-2004  . Total abdominal hysterectomy w/ bilateral oophorectomy and lysis adhesions  09-02-2007  . Colonoscopy w/ polypectomy  05-30-2007  . Esophagogastroduodenoscopy  05-09-2007  . Umbilical hernia repair  2003  . Shoulder arthroscopy with open rotator cuff repair Right 2012  . Cystoscopy with ureteroscopy and stent placement Left 05/18/2015    Procedure: CYSTOSCOPY WITH URETEROSCOPY, [place];  Surgeon: Alexis Frock, MD;  Location: [facility];  Service: Urology;  Laterality: Left;  . Cystoscopy with holmium laser lithotripsy Left 05/18/2015    Procedure: CYSTOSCOPY WITH HOLMIUM LASER LITHOTRIPSY;  Surgeon: Alexis Frock, MD;  Location: [facility];  Service: Urology;  Laterality: Left;  . Cystoscopy with retrograde pyelogram, ureteroscopy and stent placement Left 06/22/2015    Procedure: CYSTOSCOPY,  LEFT URETEROSCOPY;  Surgeon: Cleon Gustin, MD;  Location: [facility];  Service: Urology;  Laterality: Left;  . Cystoscopy w/ retrogrades Bilateral 06/22/2015    Procedure: CYSTOSCOPY WITH RETROGRADE PYELOGRAM;  Surgeon: Cleon Gustin, MD;  Location: [facility];  Service: Urology;  Laterality: Bilateral;  . Abdominal hysterectomy    . Diagnostic laparoscopy    . Anterior and posterior repair with sacrospinous fixation N/A 08/16/2015    Procedure: ANTERIOR COLPORRHAPHY WITH XENOFORM GRAFT AND SACROSPINOUS FIXATION;   Surgeon: Nunzio Cobbs, MD;  Location: [facility];  Service: Gynecology;  Laterality: N/A;  2.5 hours OR time  . Bladder suspension N/A 08/16/2015    Procedure: TRANSVAGINAL TAPE (TVT) PROCEDURE exact midurethral sling;  Surgeon: Nunzio Cobbs, MD;  Location: [facility];  Service: Gynecology;  Laterality: N/A;  . Cystoscopy N/A 08/16/2015    Procedure: CYSTOSCOPY;  Surgeon: Nunzio Cobbs, MD;  Location: [facility];  Service: Gynecology;  Laterality: N/A;  . Vaginal hysterectomy N/A 08/18/2015    Procedure: Exam under Anesthesia, Excision Xenform Graft, Removal Bilateral Sacrospinous Ligament sutures;  Surgeon: Nunzio Cobbs, MD;  Location: [facility];  Service: Gynecology;  Laterality: N/A;    Current Outpatient Prescriptions  Medication Sig Dispense Refill  . buPROPion (WELLBUTRIN XL) 300 MG 24 hr tablet Take 1 tablet (300 mg total) by mouth daily. 30 tablet 2  . mirabegron ER (MYRBETRIQ) 50 MG TB24 tablet Take 1 tablet (50 mg total) by mouth daily. One po qd 30 tablet 1   No current facility-administered medications for this visit.    Family History  Problem Relation Age of Onset  . Hypertension Mother   . Diabetes Father   . Cancer Paternal Uncle     Pancreas  . Heart attack Maternal Grandmother   . Drug abuse Sister   . Colon cancer Neg Hx   . Esophageal cancer Neg Hx   . Stomach cancer Neg Hx   . Rectal cancer Neg Hx     ROS:  Pertinent items are noted in HPI.  Otherwise, a comprehensive ROS was negative.  Exam:   BP 138/84 mmHg  Pulse 78  Resp 18  Ht 5' 5.75" (1.67 m)  Wt 240 lb (108.863 kg)  BMI 39.03 kg/m2  LMP 12/24/2006 (Approximate)    General appearance: alert, cooperative and appears stated age Head: Normocephalic, without obvious abnormality, atraumatic Neck: no adenopathy, supple, symmetrical, trachea midline and thyroid normal to inspection and palpation Lungs: clear to auscultation bilaterally Breasts: normal appearance, no masses  or tenderness, Inspection negative, No nipple retraction or dimpling, No nipple discharge or bleeding, No axillary or supraclavicular adenopathy Heart: regular rate and rhythm Abdomen: soft, non-tender; bowel sounds normal; no masses,  no organomegaly Extremities: extremities normal, atraumatic, no cyanosis or edema Skin: Skin color, texture, turgor normal. No rashes or lesions Lymph nodes: Cervical, supraclavicular, and axillary nodes normal. No abnormal inguinal nodes palpated Neurologic: Grossly normal  Pelvic: External genitalia:  no lesions              Urethra:  normal appearing urethra with no masses, tenderness or lesions              Bartholins and Skenes: normal                 Vagina: normal appearing vagina with normal color and discharge, no lesions.  Second degree cystocele, good apical support, no rectocele.  Cervix: absent              Pap taken: No. Bimanual Exam:  Uterus:  uterus absent              Adnexa: no mass, fullness, tenderness              Rectovaginal: Yes.  .  Confirms.  Stool in the rectal vault.              Anus:  normal sphincter tone, no lesions  Chaperone was present for exam.  Assessment:   Well woman visit with normal exam. Status post TAH and bilateral oophorectomy. Status post bilateral salpingectomy.  Status post anterior colporrhaphy with Xenform graft and bilateral sacrospinous fixation, TVT Exact mid urethral sling, and cystoscopy. Status post exam under anesthesia, excision of the Xenform graft, removal of bilateral sacrospinous ligament sutures due to right buttock pain Recurrent cystocele.  Asymptomatic at this point.  Overactive bladder.  Vaginal atrophy.  Plan: Yearly mammogram recommended after age 74.  Patient will schedule. Recommended self breast exam.  Pap and HR HPV as above. Discussed Calcium, Vitamin D, regular exercise program including cardiovascular and weight bearing exercise. Discussed weight loss through  calorie restriction and increased activity.  Labs performed.  No..   See orders. Refills given on medications.  Yes.  .  See orders.  Myrbetriq 50 mg daily for one year.  Premarin vaginal cream.  Discussed risks of DVT, PE, MI, stroke, and breast cancer.  Will do observation of the cystocele.   Follow up annually and prn.      After visit summary provided.

## 2016-01-06 NOTE — Patient Instructions (Signed)

## 2016-01-10 ENCOUNTER — Other Ambulatory Visit: Payer: Self-pay | Admitting: Internal Medicine

## 2016-01-10 ENCOUNTER — Encounter: Payer: Self-pay | Admitting: Internal Medicine

## 2016-01-10 ENCOUNTER — Ambulatory Visit (INDEPENDENT_AMBULATORY_CARE_PROVIDER_SITE_OTHER): Payer: BLUE CROSS/BLUE SHIELD | Admitting: Internal Medicine

## 2016-01-10 VITALS — BP 120/80 | HR 64 | Temp 98.4°F | Ht 65.5 in | Wt 239.0 lb

## 2016-01-10 DIAGNOSIS — R5383 Other fatigue: Secondary | ICD-10-CM | POA: Diagnosis not present

## 2016-01-10 DIAGNOSIS — R7989 Other specified abnormal findings of blood chemistry: Secondary | ICD-10-CM

## 2016-01-10 DIAGNOSIS — Z1211 Encounter for screening for malignant neoplasm of colon: Secondary | ICD-10-CM | POA: Diagnosis not present

## 2016-01-10 DIAGNOSIS — Z0001 Encounter for general adult medical examination with abnormal findings: Secondary | ICD-10-CM

## 2016-01-10 DIAGNOSIS — Z0000 Encounter for general adult medical examination without abnormal findings: Secondary | ICD-10-CM | POA: Diagnosis not present

## 2016-01-10 DIAGNOSIS — R635 Abnormal weight gain: Secondary | ICD-10-CM

## 2016-01-10 LAB — CBC
HCT: 41.3 % (ref 36.0–46.0)
Hemoglobin: 13.8 g/dL (ref 12.0–15.0)
MCHC: 33.3 g/dL (ref 30.0–36.0)
MCV: 84.9 fl (ref 78.0–100.0)
Platelets: 277 10*3/uL (ref 150.0–400.0)
RBC: 4.87 Mil/uL (ref 3.87–5.11)
RDW: 13.9 % (ref 11.5–15.5)
WBC: 7.4 10*3/uL (ref 4.0–10.5)

## 2016-01-10 LAB — COMPREHENSIVE METABOLIC PANEL
ALT: 22 U/L (ref 0–35)
AST: 17 U/L (ref 0–37)
Albumin: 4.2 g/dL (ref 3.5–5.2)
Alkaline Phosphatase: 97 U/L (ref 39–117)
BUN: 14 mg/dL (ref 6–23)
CO2: 29 mEq/L (ref 19–32)
Calcium: 9.7 mg/dL (ref 8.4–10.5)
Chloride: 104 mEq/L (ref 96–112)
Creatinine, Ser: 0.77 mg/dL (ref 0.40–1.20)
GFR: 84.28 mL/min (ref >60.00)
Glucose, Bld: 97 mg/dL (ref 70–99)
Potassium: 4.3 mEq/L (ref 3.5–5.1)
Sodium: 140 mEq/L (ref 135–145)
Total Bilirubin: 0.3 mg/dL (ref 0.2–1.2)
Total Protein: 7.8 g/dL (ref 6.0–8.3)

## 2016-01-10 LAB — LIPID PANEL
Cholesterol: 178 mg/dL (ref 0–200)
HDL: 45.9 mg/dL (ref >39.00)
NonHDL: 131.72
Total CHOL/HDL Ratio: 4
Triglycerides: 282 mg/dL — ABNORMAL HIGH (ref 0.0–149.0)
VLDL: 56.4 mg/dL — ABNORMAL HIGH (ref 0.0–40.0)

## 2016-01-10 LAB — FOLATE: Folate: 10.2 ng/mL (ref >5.9)

## 2016-01-10 LAB — LDL CHOLESTEROL, DIRECT: Direct LDL: 106 mg/dL

## 2016-01-10 LAB — VITAMIN D 25 HYDROXY (VIT D DEFICIENCY, FRACTURES): VITD: 23.67 ng/mL — ABNORMAL LOW (ref 30.00–100.00)

## 2016-01-10 LAB — HEMOGLOBIN A1C: Hgb A1c MFr Bld: 5.6 % (ref 4.6–6.5)

## 2016-01-10 LAB — VITAMIN B12: Vitamin B-12: 344 pg/mL (ref 211–911)

## 2016-01-10 LAB — TSH: TSH: 1.72 u[IU]/mL (ref 0.35–4.50)

## 2016-01-10 MED ORDER — PHENTERMINE HCL 15 MG PO CAPS: 15.0000 mg | ORAL_CAPSULE | ORAL | Status: DC

## 2016-01-10 NOTE — Progress Notes (Signed)
Subjective:    Patient ID: [name], female    DOB: 12/14/1966, 50 y.o.   MRN: [id number]  HPI  Pt presents to the clinic today for her annual exam.  Flu: yearly, wants one today Tetanus: 12/2013 LMP: Hysterectomy Pap Smear: 12/2014- pap smear not done Mammogram: 2/16, will schedule at [facility] Colonoscopy: 2008 at [facility] Screening: 12/24/14, will schedule Dentist: yearly  Diet: She does eat meat, fruit and vegetables a few times a week, fried and junk food more than she should. Drinks coffee, water and coke zero. Exercise: Stopped going to the gym and has not exercised. Was released 6 weeks ago for exercise  Review of Systems      Past Medical History  Diagnosis Date  . Anxiety   . Depression   . History of colon polyps     2008- BENIGN  . Left ureteral stone   . History of nephrolithiasis   . History of abnormal cervical Pap smear     1991 -- 1995  . Wears glasses   . GERD (gastroesophageal reflux disease)   . Frequency of urination   . Urgency of urination   . Microhematuria   . Hydronephrosis, left   . PONV (postoperative nausea and vomiting)   . Kidney stones 2016    Current Outpatient Prescriptions  Medication Sig Dispense Refill  . buPROPion (WELLBUTRIN XL) 300 MG 24 hr tablet Take 1 tablet (300 mg total) by mouth daily. 30 tablet 2  . conjugated estrogens (PREMARIN) vaginal cream Use 1/2 g vaginally every night at bed time for the first 2 weeks, then use 1/2 g vaginally two or three times per week. 30 g 4  . mirabegron ER (MYRBETRIQ) 50 MG TB24 tablet Take 1 tablet (50 mg total) by mouth daily. One po qd 30 tablet 11   No current facility-administered medications for this visit.    Allergies  Allergen Reactions  . Desvenlafaxine Other (See Comments)    REACTION: withdrawl  . Iohexol      Code: RASH, Desc: PT STATES BACK IN THE 70S SHE HAD IV DYE REACTION.04/30/07/RM---pt given omnipaque 300 w/out pre meds; no complications  0000000 slg, Onset Date: [id number]     Family History  Problem Relation Age of Onset  . Hypertension Mother   . Diabetes Father   . Cancer Paternal Uncle     Pancreas  . Heart attack Maternal Grandmother   . Drug abuse Sister   . Colon cancer Neg Hx   . Esophageal cancer Neg Hx   . Stomach cancer Neg Hx   . Rectal cancer Neg Hx     Social History   Social History  . Marital Status: Married    Spouse Name: N/A  . Number of Children: N/A  . Years of Education: N/A   Occupational History  . Not on file.   Social History Main Topics  . Smoking status: Former Smoker -- 15 years    Types: Cigarettes    Quit date: 05/16/2008  . Smokeless tobacco: Never Used  . Alcohol Use: No  . Drug Use: No  . Sexual Activity:    Partners: Male    Birth Control/ Protection: Surgical     Comment: Hyst   Other Topics Concern  . Not on file   Social History Narrative     Constitutional: Pt reports fatigue and weight gain. Denies fever, malaise, headache.  HEENT: Denies eye pain, eye redness, ear pain, ringing in the ears, wax  buildup, runny nose, nasal congestion, bloody nose, or sore throat. Respiratory: Denies difficulty breathing, shortness of breath, cough or sputum production.   Cardiovascular: Denies chest pain, chest tightness, palpitations or swelling in the hands or feet.  Gastrointestinal: Denies abdominal pain, bloating, constipation, diarrhea or blood in the stool.  GU: Pt reports stress incontinence has improved s/p bladder sling. Denies urgency, frequency, pain with urination, burning sensation, blood in urine, odor or discharge. Musculoskeletal: Denies decrease in range of motion, difficulty with gait, muscle pain or joint pain and swelling.  Skin: Denies redness, rashes, lesions or ulcercations.  Neurological: Denies dizziness, difficulty with memory, difficulty with speech or problems with balance and coordination.   No other specific complaints in a complete review of  systems (except as listed in HPI above).  Objective:   Physical Exam   BP 120/80 mmHg  Pulse 64  Temp(Src) 98.4 F (36.9 C) (Oral)  Ht 5' 5.5" (1.664 m)  Wt 239 lb (108.41 kg)  BMI 39.15 kg/m2  LMP 12/24/2006 (Approximate)  Wt Readings from Last 3 Encounters:  01/10/16 239 lb (108.41 kg)  01/06/16 240 lb (108.863 kg)  11/23/15 235 lb (106.595 kg)    General: Appears her stated age, obese in NAD. Skin: Warm, dry and intact. HEENT: Head: normal shape and size; Eyes: sclera white, no icterus, conjunctiva pink, PERRLA and EOMs intact; Ears: Tm's gray and intact, scar tissue noted on bilateral TM's, normal light reflex; Throat/Mouth: Teeth present, mucosa pink and moist, no exudate, lesions or ulcerations noted.  Neck:  Neck supple, trachea midline. No masses, lumps or thyromegaly present.  Cardiovascular: Normal rate and rhythm. S1,S2 noted.  No murmur, rubs or gallops noted.  Pulmonary/Chest: Normal effort and positive vesicular breath sounds. No respiratory distress. No wheezes, rales or ronchi noted.  Abdomen: Soft and nontender. Normal bowel sounds. No distention or masses noted. Liver, spleen and kidneys non palpable. Musculoskeletal: Normal range of motion. Strength 5/5 BUE/BLE. No difficulty with gait.  Neurological: Alert and oriented. Cranial nerves II-XII grossly intact. Coordination normal.  Psychiatric: Mood and affect normal. Behavior is normal. Judgment and thought content normal.     BMET    Component Value Date/Time   NA 140 08/19/2015 0515   K 4.3 08/19/2015 0515   CL 104 08/19/2015 0515   CO2 31 08/19/2015 0515   GLUCOSE 138* 08/19/2015 0515   BUN 12 08/19/2015 0515   CREATININE 0.72 08/19/2015 0515   CALCIUM 8.5* 08/19/2015 0515   GFRNONAA >60 08/19/2015 0515   GFRAA >60 08/19/2015 0515    Lipid Panel     Component Value Date/Time   CHOL 196 12/23/2014 1033   TRIG 85.0 12/23/2014 1033   HDL 52.30 12/23/2014 1033   CHOLHDL 4 12/23/2014 1033   VLDL  17.0 12/23/2014 1033   LDLCALC 127* 12/23/2014 1033    CBC    Component Value Date/Time   WBC 6.8 08/31/2015 1104   RBC 4.73 08/31/2015 1104   HGB 13.7 08/31/2015 1104   HCT 40.3 08/31/2015 1104   PLT 338 08/31/2015 1104   MCV 85.2 08/31/2015 1104   MCH 29.0 08/31/2015 1104   MCHC 34.0 08/31/2015 1104   RDW 13.6 08/31/2015 1104   LYMPHSABS 1.8 08/31/2015 1104   MONOABS 0.6 08/31/2015 1104   EOSABS 0.1 08/31/2015 1104   BASOSABS 0.0 08/31/2015 1104    Hgb A1C Lab Results  Component Value Date   HGBA1C 5.6 10/17/2015       Assessment & Plan:   Preventative  Health Maintenance:  Encouraged her to work on diet and exercise Flu shot today Tetanus UTD She will call to schedule her mammogram Pelvic exam due every 5 years Referral placed to GI for screening colonoscopy Encouraged her to visit a dentist and eye doctor once yearly  Fatigue and Abnormal Weight Gain:  Will check TSH. B12, Folate and Vit D RX for Phentermine Plan given for 1500 calorie diet Encouraged her to start working on exercise   RTC in 1 month for weight check/med refill

## 2016-01-10 NOTE — Progress Notes (Signed)
Pre visit review using our clinic review tool, if applicable. No additional management support is needed unless otherwise documented below in the visit note. 

## 2016-01-10 NOTE — Patient Instructions (Signed)
Health Maintenance, Female Adopting a healthy lifestyle and getting preventive care can go a long way to promote health and wellness. Talk with your health care provider about what schedule of regular examinations is right for you. This is a good chance for you to check in with your provider about disease prevention and staying healthy. In between checkups, there are plenty of things you can do on your own. Experts have done a lot of research about which lifestyle changes and preventive measures are most likely to keep you healthy. Ask your health care provider for more information. WEIGHT AND DIET  Eat a healthy diet  Be sure to include plenty of vegetables, fruits, low-fat dairy products, and lean protein.  Do not eat a lot of foods high in solid fats, added sugars, or salt.  Get regular exercise. This is one of the most important things you can do for your health.  Most adults should exercise for at least 150 minutes each week. The exercise should increase your heart rate and make you sweat (moderate-intensity exercise).  Most adults should also do strengthening exercises at least twice a week. This is in addition to the moderate-intensity exercise.  Maintain a healthy weight  Body mass index (BMI) is a measurement that can be used to identify possible weight problems. It estimates body fat based on height and weight. Your health care provider can help determine your BMI and help you achieve or maintain a healthy weight.  For females 20 years of age and older:   A BMI below 18.5 is considered underweight.  A BMI of 18.5 to 24.9 is normal.  A BMI of 25 to 29.9 is considered overweight.  A BMI of 30 and above is considered obese.  Watch levels of cholesterol and blood lipids  You should start having your blood tested for lipids and cholesterol at 51 years of age, then have this test every 5 years.  You may need to have your cholesterol levels checked more often if:  Your lipid  or cholesterol levels are high.  You are older than 50 years of age.  You are at high risk for heart disease.  CANCER SCREENING   Lung Cancer  Lung cancer screening is recommended for adults 55-80 years old who are at high risk for lung cancer because of a history of smoking.  A yearly low-dose CT scan of the lungs is recommended for people who:  Currently smoke.  Have quit within the past 15 years.  Have at least a 30-pack-year history of smoking. A pack year is smoking an average of one pack of cigarettes a day for 1 year.  Yearly screening should continue until it has been 15 years since you quit.  Yearly screening should stop if you develop a health problem that would prevent you from having lung cancer treatment.  Breast Cancer  Practice breast self-awareness. This means understanding how your breasts normally appear and feel.  It also means doing regular breast self-exams. Let your health care provider know about any changes, no matter how small.  If you are in your 20s or 30s, you should have a clinical breast exam (CBE) by a health care provider every 1-3 years as part of a regular health exam.  If you are 40 or older, have a CBE every year. Also consider having a breast X-ray (mammogram) every year.  If you have a family history of breast cancer, talk to your health care provider about genetic screening.  If you   are at high risk for breast cancer, talk to your health care provider about having an MRI and a mammogram every year.  Breast cancer gene (BRCA) assessment is recommended for women who have family members with BRCA-related cancers. BRCA-related cancers include:  Breast.  Ovarian.  Tubal.  Peritoneal cancers.  Results of the assessment will determine the need for genetic counseling and BRCA1 and BRCA2 testing. Cervical Cancer Your health care provider may recommend that you be screened regularly for cancer of the pelvic organs (ovaries, uterus, and  vagina). This screening involves a pelvic examination, including checking for microscopic changes to the surface of your cervix (Pap test). You may be encouraged to have this screening done every 3 years, beginning at age 21.  For women ages 30-65, health care providers may recommend pelvic exams and Pap testing every 3 years, or they may recommend the Pap and pelvic exam, combined with testing for human papilloma virus (HPV), every 5 years. Some types of HPV increase your risk of cervical cancer. Testing for HPV may also be done on women of any age with unclear Pap test results.  Other health care providers may not recommend any screening for nonpregnant women who are considered low risk for pelvic cancer and who do not have symptoms. Ask your health care provider if a screening pelvic exam is right for you.  If you have had past treatment for cervical cancer or a condition that could lead to cancer, you need Pap tests and screening for cancer for at least 20 years after your treatment. If Pap tests have been discontinued, your risk factors (such as having a new sexual partner) need to be reassessed to determine if screening should resume. Some women have medical problems that increase the chance of getting cervical cancer. In these cases, your health care provider may recommend more frequent screening and Pap tests. Colorectal Cancer  This type of cancer can be detected and often prevented.  Routine colorectal cancer screening usually begins at 50 years of age and continues through 51 years of age.  Your health care provider may recommend screening at an earlier age if you have risk factors for colon cancer.  Your health care provider may also recommend using home test kits to check for hidden blood in the stool.  A small camera at the end of a tube can be used to examine your colon directly (sigmoidoscopy or colonoscopy). This is done to check for the earliest forms of colorectal  cancer.  Routine screening usually begins at age 50.  Direct examination of the colon should be repeated every 5-10 years through 51 years of age. However, you may need to be screened more often if early forms of precancerous polyps or small growths are found. Skin Cancer  Check your skin from head to toe regularly.  Tell your health care provider about any new moles or changes in moles, especially if there is a change in a mole's shape or color.  Also tell your health care provider if you have a mole that is larger than the size of a pencil eraser.  Always use sunscreen. Apply sunscreen liberally and repeatedly throughout the day.  Protect yourself by wearing long sleeves, pants, a wide-brimmed hat, and sunglasses whenever you are outside. HEART DISEASE, DIABETES, AND HIGH BLOOD PRESSURE   High blood pressure causes heart disease and increases the risk of stroke. High blood pressure is more likely to develop in:  People who have blood pressure in the high end   of the normal range (130-139/85-89 mm Hg).  People who are overweight or obese.  People who are African American.  If you are 38-23 years of age, have your blood pressure checked every 3-5 years. If you are 61 years of age or older, have your blood pressure checked every year. You should have your blood pressure measured twice--once when you are at a hospital or clinic, and once when you are not at a hospital or clinic. Record the average of the two measurements. To check your blood pressure when you are not at a hospital or clinic, you can use:  An automated blood pressure machine at a pharmacy.  A home blood pressure monitor.  If you are between 45 years and 39 years old, ask your health care provider if you should take aspirin to prevent strokes.  Have regular diabetes screenings. This involves taking a blood sample to check your fasting blood sugar level.  If you are at a normal weight and have a low risk for diabetes,  have this test once every three years after 51 years of age.  If you are overweight and have a high risk for diabetes, consider being tested at a younger age or more often. PREVENTING INFECTION  Hepatitis B  If you have a higher risk for hepatitis B, you should be screened for this virus. You are considered at high risk for hepatitis B if:  You were born in a country where hepatitis B is common. Ask your health care provider which countries are considered high risk.  Your parents were born in a high-risk country, and you have not been immunized against hepatitis B (hepatitis B vaccine).  You have HIV or AIDS.  You use needles to inject street drugs.  You live with someone who has hepatitis B.  You have had sex with someone who has hepatitis B.  You get hemodialysis treatment.  You take certain medicines for conditions, including cancer, organ transplantation, and autoimmune conditions. Hepatitis C  Blood testing is recommended for:  Everyone born from [birth year] through 1966.  Anyone with known risk factors for hepatitis C. Sexually transmitted infections (STIs)  You should be screened for sexually transmitted infections (STIs) including gonorrhea and chlamydia if:  You are sexually active and are younger than 51 years of age.  You are older than 51 years of age and your health care provider tells you that you are at risk for this type of infection.  Your sexual activity has changed since you were last screened and you are at an increased risk for chlamydia or gonorrhea. Ask your health care provider if you are at risk.  If you do not have HIV, but are at risk, it may be recommended that you take a prescription medicine daily to prevent HIV infection. This is called pre-exposure prophylaxis (PrEP). You are considered at risk if:  You are sexually active and do not regularly use condoms or know the HIV status of your partner(s).  You take drugs by injection.  You are sexually  active with a partner who has HIV. Talk with your health care provider about whether you are at high risk of being infected with HIV. If you choose to begin PrEP, you should first be tested for HIV. You should then be tested every 3 months for as long as you are taking PrEP.  PREGNANCY   If you are premenopausal and you may become pregnant, ask your health care provider about preconception counseling.  If you may  become pregnant, take 400 to 800 micrograms (mcg) of folic acid every day.  If you want to prevent pregnancy, talk to your health care provider about birth control (contraception). OSTEOPOROSIS AND MENOPAUSE   Osteoporosis is a disease in which the bones lose minerals and strength with aging. This can result in serious bone fractures. Your risk for osteoporosis can be identified using a bone density scan.  If you are 61 years of age or older, or if you are at risk for osteoporosis and fractures, ask your health care provider if you should be screened.  Ask your health care provider whether you should take a calcium or vitamin D supplement to lower your risk for osteoporosis.  Menopause may have certain physical symptoms and risks.  Hormone replacement therapy may reduce some of these symptoms and risks. Talk to your health care provider about whether hormone replacement therapy is right for you.  HOME CARE INSTRUCTIONS   Schedule regular health, dental, and eye exams.  Stay current with your immunizations.   Do not use any tobacco products including cigarettes, chewing tobacco, or electronic cigarettes.  If you are pregnant, do not drink alcohol.  If you are breastfeeding, limit how much and how often you drink alcohol.  Limit alcohol intake to no more than 1 drink per day for nonpregnant women. One drink equals 12 ounces of beer, 5 ounces of wine, or 1 ounces of hard liquor.  Do not use street drugs.  Do not share needles.  Ask your health care provider for help if  you need support or information about quitting drugs.  Tell your health care provider if you often feel depressed.  Tell your health care provider if you have ever been abused or do not feel safe at home.   This information is not intended to replace advice given to you by your health care provider. Make sure you discuss any questions you have with your health care provider.   Document Released: 06/25/2011 Document Revised: 12/31/2014 Document Reviewed: 11/11/2013 Elsevier Interactive Patient Education [employer].

## 2016-01-11 ENCOUNTER — Encounter: Payer: Self-pay | Admitting: Internal Medicine

## 2016-01-23 ENCOUNTER — Encounter: Payer: Self-pay | Admitting: Internal Medicine

## 2016-01-23 ENCOUNTER — Inpatient Hospital Stay (HOSPITAL_COMMUNITY)
Admission: EM | Admit: 2016-01-23 | Discharge: 2016-01-23 | Disposition: A | Discharge status: Home / Self Care | Payer: BLUE CROSS/BLUE SHIELD | Attending: Gynecology | Admitting: Gynecology

## 2016-01-23 ENCOUNTER — Encounter (HOSPITAL_COMMUNITY): Payer: Self-pay

## 2016-01-23 DIAGNOSIS — N390 Urinary tract infection, site not specified: Secondary | ICD-10-CM | POA: Diagnosis not present

## 2016-01-23 DIAGNOSIS — Z87891 Personal history of nicotine dependence: Secondary | ICD-10-CM | POA: Diagnosis not present

## 2016-01-23 DIAGNOSIS — R1032 Left lower quadrant pain: Secondary | ICD-10-CM | POA: Diagnosis present

## 2016-01-23 LAB — URINE MICROSCOPIC-ADD ON: RBC / HPF: NONE SEEN RBC/hpf (ref 0–5)

## 2016-01-23 LAB — URINALYSIS, ROUTINE W REFLEX MICROSCOPIC
Bilirubin Urine: NEGATIVE
Glucose, UA: NEGATIVE mg/dL
Ketones, ur: NEGATIVE mg/dL
Nitrite: POSITIVE — AB
Protein, ur: NEGATIVE mg/dL
Specific Gravity, Urine: <1.005 — ABNORMAL LOW (ref 1.005–1.030)
pH: 6 (ref 5.0–8.0)

## 2016-01-23 MED ORDER — SULFAMETHOXAZOLE-TRIMETHOPRIM 800-160 MG PO TABS: 1.0000 | ORAL_TABLET | Freq: Two times a day (BID) | ORAL | Status: DC

## 2016-01-23 NOTE — MAU Provider Note (Signed)
History     CSN: [id number]  Arrival date and time: 01/23/16 1816   First Provider Initiated Contact with Patient 01/23/16 2010      Chief Complaint  Patient presents with  . Abdominal Pain  . Dysuria   Abdominal Pain This is a new problem. The current episode started today. The onset quality is gradual. The problem occurs constantly. The problem has been unchanged. The pain is located in the LLQ. The pain is at a severity of 8/10. The quality of the pain is cramping. The abdominal pain radiates to the back. Associated symptoms include dysuria and nausea. Pertinent negatives include no constipation, diarrhea, fever, frequency or vomiting. Nothing aggravates the pain. The pain is relieved by nothing. She has tried acetaminophen for the symptoms. The treatment provided no relief.    Past Medical History  Diagnosis Date  . Anxiety   . Depression   . History of colon polyps     2008- BENIGN  . Left ureteral stone   . History of nephrolithiasis   . History of abnormal cervical Pap smear     1991 -- 1995  . Wears glasses   . GERD (gastroesophageal reflux disease)   . Frequency of urination   . Urgency of urination   . Microhematuria   . Hydronephrosis, left   . PONV (postoperative nausea and vomiting)   . Kidney stones 2016    Past Surgical History  Procedure Laterality Date  . Laparoscopic cholecystectomy  1999  . Tubal ligation Bilateral 1995  . Cystoscopy w/ ureteral stent placement  02/  2000  . Extracorporeal shock wave lithotripsy  2001  &  2002  . Dx laparoscopy  X2  . Exploratory laparotomy w/ bilateral salpingectomy and removal right ectopic preg.  02-23-2004  . Total abdominal hysterectomy w/ bilateral oophorectomy and lysis adhesions  09-02-2007  . Colonoscopy w/ polypectomy  05-30-2007  . Esophagogastroduodenoscopy  05-09-2007  . Umbilical hernia repair  2003  . Shoulder arthroscopy with open rotator cuff repair Right 2012  . Cystoscopy with ureteroscopy and  stent placement Left 05/18/2015    Procedure: CYSTOSCOPY WITH URETEROSCOPY, [place];  Surgeon: Alexis Frock, MD;  Location: [facility];  Service: Urology;  Laterality: Left;  . Cystoscopy with holmium laser lithotripsy Left 05/18/2015    Procedure: CYSTOSCOPY WITH HOLMIUM LASER LITHOTRIPSY;  Surgeon: Alexis Frock, MD;  Location: [facility];  Service: Urology;  Laterality: Left;  . Cystoscopy with retrograde pyelogram, ureteroscopy and stent placement Left 06/22/2015    Procedure: CYSTOSCOPY,  LEFT URETEROSCOPY;  Surgeon: Cleon Gustin, MD;  Location: [facility];  Service: Urology;  Laterality: Left;  . Cystoscopy w/ retrogrades Bilateral 06/22/2015    Procedure: CYSTOSCOPY WITH RETROGRADE PYELOGRAM;  Surgeon: Cleon Gustin, MD;  Location: [facility];  Service: Urology;  Laterality: Bilateral;  . Abdominal hysterectomy    . Diagnostic laparoscopy    . Anterior and posterior repair with sacrospinous fixation N/A 08/16/2015    Procedure: ANTERIOR COLPORRHAPHY WITH XENOFORM GRAFT AND SACROSPINOUS FIXATION;  Surgeon: Nunzio Cobbs, MD;  Location: [facility];  Service: Gynecology;  Laterality: N/A;  2.5 hours OR time  . Bladder suspension N/A 08/16/2015    Procedure: TRANSVAGINAL TAPE (TVT) PROCEDURE exact midurethral sling;  Surgeon: Nunzio Cobbs, MD;  Location: [facility];  Service: Gynecology;  Laterality: N/A;  . Cystoscopy N/A 08/16/2015    Procedure: CYSTOSCOPY;  Surgeon: Everardo All  Yisroel Ramming, MD;  Location: [facility];  Service: Gynecology;  Laterality: N/A;  . Vaginal hysterectomy N/A 08/18/2015    Procedure: Exam under Anesthesia, Excision Xenform Graft, Removal Bilateral Sacrospinous Ligament sutures;  Surgeon: Nunzio Cobbs, MD;  Location: [facility];  Service: Gynecology;  Laterality: N/A;    Family History  Problem Relation Age of Onset  . Hypertension Mother   .  Diabetes Father   . Cancer Paternal Uncle     Pancreas  . Heart attack Maternal Grandmother   . Drug abuse Sister   . Colon cancer Neg Hx   . Esophageal cancer Neg Hx   . Stomach cancer Neg Hx   . Rectal cancer Neg Hx     Social History  Substance Use Topics  . Smoking status: Former Smoker -- 15 years    Types: Cigarettes    Quit date: 05/16/2008  . Smokeless tobacco: Never Used  . Alcohol Use: No    Allergies:  Allergies  Allergen Reactions  . Desvenlafaxine Other (See Comments)    REACTION: withdrawl  . Iohexol      Code: RASH, Desc: PT STATES BACK IN THE 70S SHE HAD IV DYE REACTION.04/30/07/RM---pt given omnipaque 300 w/out pre meds; no complications 0000000 slg, Onset Date: [id number]     Prescriptions prior to admission  Medication Sig Dispense Refill Last Dose  . acetaminophen (TYLENOL) 500 MG tablet Take 1,000 mg by mouth every 6 (six) hours as needed for moderate pain.   01/23/2016 at Unknown time  . buPROPion (WELLBUTRIN XL) 300 MG 24 hr tablet TAKE 1 TABLET (300 MG TOTAL) BY MOUTH DAILY. 30 tablet 5 01/22/2016 at Unknown time  . cholecalciferol (VITAMIN D) 1000 units tablet Take 1,000 Units by mouth daily.   01/22/2016 at Unknown time  . conjugated estrogens (PREMARIN) vaginal cream Use 1/2 g vaginally every night at bed time for the first 2 weeks, then use 1/2 g vaginally two or three times per week. (Patient taking differently: Place 1 Applicatorful vaginally 3 (three) times a week. ) 30 g 4 Past Week at Unknown time  . mirabegron ER (MYRBETRIQ) 50 MG TB24 tablet Take 1 tablet (50 mg total) by mouth daily. One po qd (Patient taking differently: Take 50 mg by mouth daily. ) 30 tablet 11 01/22/2016 at Unknown time  . Multiple Vitamin (MULTIVITAMIN WITH MINERALS) TABS tablet Take 1 tablet by mouth daily.   01/22/2016 at Unknown time  . Omega-3 Fatty Acids (FISH OIL PO) Take 1 capsule by mouth daily.   01/22/2016 at Unknown time  . phentermine 15 MG capsule Take 1 capsule (15 mg  total) by mouth every morning. 30 capsule 0 01/23/2016 at Unknown time    Review of Systems  Constitutional: Negative for fever and chills.  Gastrointestinal: Positive for nausea and abdominal pain. Negative for vomiting, diarrhea and constipation.  Genitourinary: Positive for dysuria and urgency. Negative for frequency.   Physical Exam   Blood pressure 146/72, pulse 82, temperature 97.8 F (36.6 C), resp. rate 18, last menstrual period 12/24/2006.  Physical Exam  Nursing note and vitals reviewed. Constitutional: She is oriented to person, place, and time. She appears well-developed and well-nourished. No distress.  HENT:  Head: Normocephalic.  Cardiovascular: Normal rate.   Respiratory: Effort normal.  GI: Soft. There is no tenderness. There is no rebound.  Genitourinary:  No CVA tenderness   Neurological: She is alert and oriented to person, place, and time.  Skin: Skin is warm and  dry.  Psychiatric: She has a normal mood and affect.   Results for orders placed or performed during the hospital encounter of 01/23/16 (from the past 24 hour(s))  Urinalysis, Routine w reflex microscopic (not at [facility])     Status: Abnormal   Collection Time: 01/23/16  6:55 PM  Result Value Ref Range   Color, Urine YELLOW YELLOW   APPearance CLEAR CLEAR   Specific Gravity, Urine <1.005 (L) 1.005 - 1.030   pH 6.0 5.0 - 8.0   Glucose, UA NEGATIVE NEGATIVE mg/dL   Hgb urine dipstick LARGE (A) NEGATIVE   Bilirubin Urine NEGATIVE NEGATIVE   Ketones, ur NEGATIVE NEGATIVE mg/dL   Protein, ur NEGATIVE NEGATIVE mg/dL   Nitrite POSITIVE (A) NEGATIVE   Leukocytes, UA SMALL (A) NEGATIVE  Urine microscopic-add on     Status: Abnormal   Collection Time: 01/23/16  6:55 PM  Result Value Ref Range   Squamous Epithelial / LPF 0-5 (A) NONE SEEN   WBC, UA 6-30 0 - 5 WBC/hpf   RBC / HPF NONE SEEN 0 - 5 RBC/hpf   Bacteria, UA MANY (A) NONE SEEN    MAU Course  Procedures  MDM   Assessment and Plan   1.  UTI (lower urinary tract infection)    DC home Comfort measures reviewed  RX: bactrim DS BID #[id number]   Follow-up Information    Follow up with Arloa Koh, MD.   Specialty:  Obstetrics and Gynecology   Why:  If symptoms worsen   Contact information:   [address] [place] [address] [phone]         Mathis Bud 01/23/2016, 8:12 PM

## 2016-01-23 NOTE — MAU Note (Signed)
Sudden onset of pain in mid abd, around to left side and left lower back.  States was walking across the parking lot when it hit.  Pain is not as bad now.

## 2016-01-23 NOTE — MAU Note (Signed)
Some pain/ spasms with urination.

## 2016-01-23 NOTE — Discharge Instructions (Signed)

## 2016-01-24 ENCOUNTER — Ambulatory Visit (INDEPENDENT_AMBULATORY_CARE_PROVIDER_SITE_OTHER): Payer: BLUE CROSS/BLUE SHIELD | Admitting: Internal Medicine

## 2016-01-24 ENCOUNTER — Ambulatory Visit (INDEPENDENT_AMBULATORY_CARE_PROVIDER_SITE_OTHER)
Admission: RE | Admit: 2016-01-24 | Discharge: 2016-01-24 | Disposition: A | Discharge status: Home / Self Care | Payer: BLUE CROSS/BLUE SHIELD | Source: Ambulatory Visit | Attending: Internal Medicine | Admitting: Internal Medicine

## 2016-01-24 ENCOUNTER — Other Ambulatory Visit: Payer: Self-pay | Admitting: Internal Medicine

## 2016-01-24 ENCOUNTER — Encounter: Payer: Self-pay | Admitting: Internal Medicine

## 2016-01-24 VITALS — BP 124/82 | HR 108 | Temp 100.0°F | Wt 231.5 lb

## 2016-01-24 DIAGNOSIS — R110 Nausea: Secondary | ICD-10-CM

## 2016-01-24 DIAGNOSIS — R109 Unspecified abdominal pain: Secondary | ICD-10-CM

## 2016-01-24 DIAGNOSIS — R000 Tachycardia, unspecified: Secondary | ICD-10-CM

## 2016-01-24 DIAGNOSIS — R509 Fever, unspecified: Secondary | ICD-10-CM

## 2016-01-24 LAB — COMPREHENSIVE METABOLIC PANEL
ALT: 45 U/L — ABNORMAL HIGH (ref 0–35)
AST: 51 U/L — ABNORMAL HIGH (ref 0–37)
Albumin: 4.1 g/dL (ref 3.5–5.2)
Alkaline Phosphatase: 116 U/L (ref 39–117)
BUN: 10 mg/dL (ref 6–23)
CO2: 27 mEq/L (ref 19–32)
Calcium: 9.4 mg/dL (ref 8.4–10.5)
Chloride: 103 mEq/L (ref 96–112)
Creatinine, Ser: 0.96 mg/dL (ref 0.40–1.20)
GFR: 65.33 mL/min (ref >60.00)
Glucose, Bld: 127 mg/dL — ABNORMAL HIGH (ref 70–99)
Potassium: 4.3 mEq/L (ref 3.5–5.1)
Sodium: 137 mEq/L (ref 135–145)
Total Bilirubin: 0.6 mg/dL (ref 0.2–1.2)
Total Protein: 7.7 g/dL (ref 6.0–8.3)

## 2016-01-24 LAB — POC URINALSYSI DIPSTICK (AUTOMATED)
Bilirubin, UA: NEGATIVE
Glucose, UA: NEGATIVE
Ketones, UA: NEGATIVE
Nitrite, UA: NEGATIVE
Spec Grav, UA: 1.015
Urobilinogen, UA: NEGATIVE
pH, UA: 7.5

## 2016-01-24 LAB — CBC
HCT: 39 % (ref 36.0–46.0)
Hemoglobin: 13.2 g/dL (ref 12.0–15.0)
MCHC: 33.9 g/dL (ref 30.0–36.0)
MCV: 84.1 fl (ref 78.0–100.0)
Platelets: 236 10*3/uL (ref 150.0–400.0)
RBC: 4.63 Mil/uL (ref 3.87–5.11)
RDW: 14.1 % (ref 11.5–15.5)
WBC: 14.7 10*3/uL — ABNORMAL HIGH (ref 4.0–10.5)

## 2016-01-24 MED ORDER — KETOROLAC TROMETHAMINE 60 MG/2ML IM SOLN
60.0000 mg | Freq: Once | INTRAMUSCULAR | Status: AC
  Administered 2016-01-24: 60 mg via INTRAMUSCULAR

## 2016-01-24 MED ORDER — CIPROFLOXACIN HCL 500 MG PO TABS: 500.0000 mg | ORAL_TABLET | Freq: Two times a day (BID) | ORAL | Status: DC

## 2016-01-24 MED ORDER — ONDANSETRON HCL 4 MG/2ML IJ SOLN
4.0000 mg | Freq: Once | INTRAMUSCULAR | Status: AC
  Administered 2016-01-24: 4 mg via INTRAMUSCULAR

## 2016-01-24 MED ORDER — HYDROCODONE-ACETAMINOPHEN 5-325 MG PO TABS: 1.0000 | ORAL_TABLET | Freq: Four times a day (QID) | ORAL | Status: DC | PRN

## 2016-01-24 MED ORDER — CEFTRIAXONE SODIUM 1 G IJ SOLR
1.0000 g | Freq: Once | INTRAMUSCULAR | Status: AC
  Administered 2016-01-24: 1 g via INTRAMUSCULAR

## 2016-01-24 NOTE — Addendum Note (Signed)
Addended by: Marchia Bond on: 01/24/2016 02:32 PM   Modules accepted: Miquel Dunn

## 2016-01-24 NOTE — Progress Notes (Signed)
Pre visit review using our clinic review tool, if applicable. No additional management support is needed unless otherwise documented below in the visit note. 

## 2016-01-24 NOTE — Addendum Note (Signed)
Addended by: Lurlean Nanny on: 01/24/2016 04:58 PM   Modules accepted: Orders

## 2016-01-24 NOTE — Addendum Note (Signed)
Addended by: Lurlean Nanny on: 01/24/2016 04:41 PM   Modules accepted: Orders

## 2016-01-24 NOTE — Progress Notes (Signed)
Subjective:    Patient ID: [name], female    DOB: 1966/08/24, 50 y.o.   MRN: [id number]  HPI  Pt presents today with c/o LLQ pain with radiation to left flank. This started yesterday. The pain is a sharp and stabbing, seems to be getting worse. She took Tylenol without any relief. She was seen in the ER yesterday for the same. In the ER,  urinalysis showed large blood, small lueks and positive nitrites. Pt was prescribed Bactrim PO BID for 7 days. She has a history of kidney stones and hydronephrosis. Surgical history includes cystoscopy with stent placement and two lithotripsy procedures. She has an appointment with her urologist tomorrow. She has been running fevers up to 100.0. She is nauseated and feels like her pain is getting worse.  Review of Systems      Past Medical History  Diagnosis Date  . Anxiety   . Depression   . History of colon polyps     2008- BENIGN  . Left ureteral stone   . History of nephrolithiasis   . History of abnormal cervical Pap smear     1991 -- 1995  . Wears glasses   . GERD (gastroesophageal reflux disease)   . Frequency of urination   . Urgency of urination   . Microhematuria   . Hydronephrosis, left   . PONV (postoperative nausea and vomiting)   . Kidney stones 2016    Current Outpatient Prescriptions  Medication Sig Dispense Refill  . acetaminophen (TYLENOL) 500 MG tablet Take 1,000 mg by mouth every 6 (six) hours as needed for moderate pain.    Marland Kitchen buPROPion (WELLBUTRIN XL) 300 MG 24 hr tablet TAKE 1 TABLET (300 MG TOTAL) BY MOUTH DAILY. 30 tablet 5  . cholecalciferol (VITAMIN D) 1000 units tablet Take 1,000 Units by mouth daily.    Marland Kitchen conjugated estrogens (PREMARIN) vaginal cream Use 1/2 g vaginally every night at bed time for the first 2 weeks, then use 1/2 g vaginally two or three times per week. (Patient taking differently: Place 1 Applicatorful vaginally 3 (three) times a week. ) 30 g 4  . mirabegron ER (MYRBETRIQ) 50 MG TB24 tablet  Take 1 tablet (50 mg total) by mouth daily. One po qd (Patient taking differently: Take 50 mg by mouth daily. ) 30 tablet 11  . Multiple Vitamin (MULTIVITAMIN WITH MINERALS) TABS tablet Take 1 tablet by mouth daily.    . Omega-3 Fatty Acids (FISH OIL PO) Take 1 capsule by mouth daily.    . phentermine 15 MG capsule Take 1 capsule (15 mg total) by mouth every morning. 30 capsule 0  . sulfamethoxazole-trimethoprim (BACTRIM DS,SEPTRA DS) 800-160 MG tablet Take 1 tablet by mouth 2 (two) times daily. 14 tablet 0   No current facility-administered medications for this visit.    Allergies  Allergen Reactions  . Desvenlafaxine Other (See Comments)    REACTION: withdrawl  . Iohexol      Code: RASH, Desc: PT STATES BACK IN THE 70S SHE HAD IV DYE REACTION.04/30/07/RM---pt given omnipaque 300 w/out pre meds; no complications 0000000 slg, Onset Date: [id number]     Family History  Problem Relation Age of Onset  . Hypertension Mother   . Diabetes Father   . Cancer Paternal Uncle     Pancreas  . Heart attack Maternal Grandmother   . Drug abuse Sister   . Colon cancer Neg Hx   . Esophageal cancer Neg Hx   . Stomach cancer Neg Hx   .  Rectal cancer Neg Hx     Social History   Social History  . Marital Status: Married    Spouse Name: N/A  . Number of Children: N/A  . Years of Education: N/A   Occupational History  . Not on file.   Social History Main Topics  . Smoking status: Former Smoker -- 15 years    Types: Cigarettes    Quit date: 05/16/2008  . Smokeless tobacco: Never Used  . Alcohol Use: No  . Drug Use: No  . Sexual Activity:    Partners: Male    Birth Control/ Protection: Surgical     Comment: Hyst   Other Topics Concern  . Not on file   Social History Narrative     Constitutional: Pt reports fever. Denies malaise, fatigue, headache or abrupt weight changes.  Respiratory: Denies difficulty breathing, shortness of breath, cough or sputum production.   Cardiovascular:  Denies chest pain, chest tightness, palpitations or swelling in the hands or feet.  Gastrointestinal: Pt reports abdominal pain and nausea. Denies bloating, constipation, diarrhea or blood in the stool.  GU: Pt reports left flank pain. Denies urgency, frequency, pain with urination, burning sensation, blood in urine, odor or discharge.  No other specific complaints in a complete review of systems (except as listed in HPI above).  Objective:   Physical Exam  BP 124/82 mmHg  Pulse 108  Temp(Src) 100 F (37.8 C) (Oral)  Wt 231 lb 8 oz (105.008 kg)  SpO2 98%  LMP 12/24/2006 (Approximate) Wt Readings from Last 3 Encounters:  01/24/16 231 lb 8 oz (105.008 kg)  01/10/16 239 lb (108.41 kg)  01/06/16 240 lb (108.863 kg)    General: Appears her stated age, ill appearing in NAD. Cardiovascular: Tachycardic with normal rhythm. S1,S2 noted.  No murmur, rubs or gallops noted.  Pulmonary/Chest: Normal effort and positive vesicular breath sounds. No respiratory distress. No wheezes, rales or ronchi noted.  Abdomen: Soft and tender in the suprapubic area. Normal bowel sounds. CVA tenderness noted on the left.   BMET    Component Value Date/Time   NA 140 01/10/2016 1438   K 4.3 01/10/2016 1438   CL 104 01/10/2016 1438   CO2 29 01/10/2016 1438   GLUCOSE 97 01/10/2016 1438   BUN 14 01/10/2016 1438   CREATININE 0.77 01/10/2016 1438   CALCIUM 9.7 01/10/2016 1438   GFRNONAA >60 08/19/2015 0515   GFRAA >60 08/19/2015 0515    Lipid Panel     Component Value Date/Time   CHOL 178 01/10/2016 1438   TRIG 282.0* 01/10/2016 1438   HDL 45.90 01/10/2016 1438   CHOLHDL 4 01/10/2016 1438   VLDL 56.4* 01/10/2016 1438   LDLCALC 127* 12/23/2014 1033    CBC    Component Value Date/Time   WBC 7.4 01/10/2016 1438   RBC 4.87 01/10/2016 1438   HGB 13.8 01/10/2016 1438   HCT 41.3 01/10/2016 1438   PLT 277.0 01/10/2016 1438   MCV 84.9 01/10/2016 1438   MCH 29.0 08/31/2015 1104   MCHC 33.3  01/10/2016 1438   RDW 13.9 01/10/2016 1438   LYMPHSABS 1.8 08/31/2015 1104   MONOABS 0.6 08/31/2015 1104   EOSABS 0.1 08/31/2015 1104   BASOSABS 0.0 08/31/2015 1104    Hgb A1C Lab Results  Component Value Date   HGBA1C 5.6 01/10/2016         Assessment & Plan:   Left flank pain, fever, tachycardia, nausea:  ER notes reviewed Concerning for kidney stone vs pyelonephritis Repeat  urinalysis- 3+ leuks, 2+ blood, negative nitrites Continue Bactrim 4 mg Zofran IM today 60 mg Toradol IM today 1 gm Rocephin IM today STAT CBC and CMET STAT CT Renal Stone study RX for Norco 5-325 mg TID prn for pain  Will follow up after labs/CT- she will follow up with urology tomorrow To ER if pain worsens

## 2016-01-24 NOTE — Patient Instructions (Signed)
Flank Pain °Flank pain refers to pain that is located on the side of the body between the upper abdomen and the back. The pain may occur over a short period of time (acute) or may be long-term or reoccurring (chronic). It may be mild or severe. Flank pain can be caused by many things. °CAUSES  °Some of the more common causes of flank pain include: °· Muscle strains.   °· Muscle spasms.   °· A disease of your spine (vertebral disk disease).   °· A lung infection (pneumonia).   °· Fluid around your lungs (pulmonary edema).   °· A kidney infection.   °· Kidney stones.   °· A very painful skin rash caused by the chickenpox virus (shingles).   °· Gallbladder disease.   °HOME CARE INSTRUCTIONS  °Home care will depend on the cause of your pain. In general, °· Rest as directed by your caregiver. °· Drink enough fluids to keep your urine clear or pale yellow. °· Only take over-the-counter or prescription medicines as directed by your caregiver. Some medicines may help relieve the pain. °· Tell your caregiver about any changes in your pain. °· Follow up with your caregiver as directed. °SEEK IMMEDIATE MEDICAL CARE IF:  °· Your pain is not controlled with medicine.   °· You have new or worsening symptoms. °· Your pain increases.   °· You have abdominal pain.   °· You have shortness of breath.   °· You have persistent nausea or vomiting.   °· You have swelling in your abdomen.   °· You feel faint or pass out.   °· You have blood in your urine. °· You have a fever or persistent symptoms for more than 2-3 days. °· You have a fever and your symptoms suddenly get worse. °MAKE SURE YOU:  °· Understand these instructions. °· Will watch your condition. °· Will get help right away if you are not doing well or get worse. °  °This information is not intended to replace advice given to you by your health care provider. Make sure you discuss any questions you have with your health care provider. °  °Document Released: 01/31/2006 Document  Revised: 09/03/2012 Document Reviewed: 07/24/2012 °Elsevier Interactive Patient Education ©2016 [employer] ° °

## 2016-01-25 ENCOUNTER — Encounter: Payer: Self-pay | Admitting: Internal Medicine

## 2016-01-27 LAB — URINE CULTURE: Colony Count: 80000

## 2016-01-30 ENCOUNTER — Other Ambulatory Visit: Payer: Self-pay | Admitting: Internal Medicine

## 2016-01-30 ENCOUNTER — Encounter: Payer: Self-pay | Admitting: Internal Medicine

## 2016-01-30 ENCOUNTER — Encounter: Payer: Self-pay | Admitting: Obstetrics and Gynecology

## 2016-01-30 MED ORDER — FLUCONAZOLE 150 MG PO TABS: 150.0000 mg | ORAL_TABLET | Freq: Once | ORAL | Status: DC

## 2016-01-31 ENCOUNTER — Encounter: Payer: Self-pay | Admitting: Internal Medicine

## 2016-01-31 ENCOUNTER — Ambulatory Visit (INDEPENDENT_AMBULATORY_CARE_PROVIDER_SITE_OTHER): Payer: BLUE CROSS/BLUE SHIELD | Admitting: Internal Medicine

## 2016-01-31 VITALS — BP 118/78 | HR 80 | Temp 97.7°F | Wt 232.0 lb

## 2016-01-31 DIAGNOSIS — N12 Tubulo-interstitial nephritis, not specified as acute or chronic: Secondary | ICD-10-CM | POA: Diagnosis not present

## 2016-01-31 DIAGNOSIS — B379 Candidiasis, unspecified: Secondary | ICD-10-CM

## 2016-01-31 DIAGNOSIS — T3695XA Adverse effect of unspecified systemic antibiotic, initial encounter: Secondary | ICD-10-CM

## 2016-01-31 LAB — POC URINALSYSI DIPSTICK (AUTOMATED)
Bilirubin, UA: NEGATIVE
Blood, UA: NEGATIVE
Glucose, UA: NEGATIVE
Ketones, UA: NEGATIVE
Leukocytes, UA: NEGATIVE
Nitrite, UA: NEGATIVE
Protein, UA: NEGATIVE
Spec Grav, UA: 1.015
Urobilinogen, UA: NEGATIVE
pH, UA: 6

## 2016-01-31 NOTE — Progress Notes (Signed)
Pre visit review using our clinic review tool, if applicable. No additional management support is needed unless otherwise documented below in the visit note. 

## 2016-01-31 NOTE — Progress Notes (Signed)
Subjective:    Patient ID: [name], female    DOB: [birth year], 50 y.o.   MRN: [id number]  HPI  Pt presents to the clinic today to follow up pyelonephritis. She was seen 01/24/16 for the same. CT scan of the abdomen confirmed pyelonephritis. She was started on a 14 day course of Cipro. She called the office Friday stating she developed a yeast infection, and OTC Monistat and Vagisil was not working. She was called in Diflucan and took this last night.  She also complains of lower abdominal pressure, back pain and mild abdominal cramping. She states that all of these symptoms are continuing to improve.   Review of Systems      Past Medical History  Diagnosis Date  . Anxiety   . Depression   . History of colon polyps     2008- BENIGN  . Left ureteral stone   . History of nephrolithiasis   . History of abnormal cervical Pap smear     1991 -- 1995  . Wears glasses   . GERD (gastroesophageal reflux disease)   . Frequency of urination   . Urgency of urination   . Microhematuria   . Hydronephrosis, left   . PONV (postoperative nausea and vomiting)   . Kidney stones 2016    Current Outpatient Prescriptions  Medication Sig Dispense Refill  . acetaminophen (TYLENOL) 500 MG tablet Take 1,000 mg by mouth every 6 (six) hours as needed for moderate pain.    Marland Kitchen buPROPion (WELLBUTRIN XL) 300 MG 24 hr tablet TAKE 1 TABLET (300 MG TOTAL) BY MOUTH DAILY. 30 tablet 5  . cholecalciferol (VITAMIN D) 1000 units tablet Take 1,000 Units by mouth daily.    . ciprofloxacin (CIPRO) 500 MG tablet Take 1 tablet (500 mg total) by mouth 2 (two) times daily. 28 tablet 0  . HYDROcodone-acetaminophen (NORCO/VICODIN) 5-325 MG tablet Take 1 tablet by mouth every 6 (six) hours as needed for moderate pain. 30 tablet 0  . mirabegron ER (MYRBETRIQ) 50 MG TB24 tablet Take 1 tablet (50 mg total) by mouth daily. One po qd (Patient taking differently: Take 50 mg by mouth daily. ) 30 tablet 11  . Multiple Vitamin  (MULTIVITAMIN WITH MINERALS) TABS tablet Take 1 tablet by mouth daily.    . Omega-3 Fatty Acids (FISH OIL PO) Take 1 capsule by mouth daily.    . phentermine 15 MG capsule Take 1 capsule (15 mg total) by mouth every morning. 30 capsule 0  . conjugated estrogens (PREMARIN) vaginal cream Use 1/2 g vaginally every night at bed time for the first 2 weeks, then use 1/2 g vaginally two or three times per week. (Patient not taking: Reported on 01/31/2016) 30 g 4  . fluconazole (DIFLUCAN) 150 MG tablet Take 1 tablet (150 mg total) by mouth once. (Patient not taking: Reported on 01/31/2016) 1 tablet 0   No current facility-administered medications for this visit.    Allergies  Allergen Reactions  . Desvenlafaxine Other (See Comments)    REACTION: withdrawl  . Iohexol      Code: RASH, Desc: PT STATES BACK IN THE 70S SHE HAD IV DYE REACTION.04/30/07/RM---pt given omnipaque 300 w/out pre meds; no complications 0000000 slg, Onset Date: [id number]     Family History  Problem Relation Age of Onset  . Hypertension Mother   . Diabetes Father   . Cancer Paternal Uncle     Pancreas  . Heart attack Maternal Grandmother   . Drug abuse Sister   .  Colon cancer Neg Hx   . Esophageal cancer Neg Hx   . Stomach cancer Neg Hx   . Rectal cancer Neg Hx     Social History   Social History  . Marital Status: Married    Spouse Name: N/A  . Number of Children: N/A  . Years of Education: N/A   Occupational History  . Not on file.   Social History Main Topics  . Smoking status: Former Smoker -- 15 years    Types: Cigarettes    Quit date: 05/16/2008  . Smokeless tobacco: Never Used  . Alcohol Use: No  . Drug Use: No  . Sexual Activity:    Partners: Male    Birth Control/ Protection: Surgical     Comment: Hyst   Other Topics Concern  . Not on file   Social History Narrative     Constitutional: Denies fever, malaise, fatigue, headache or abrupt weight changes.  Gastrointestinal: Positive mild  abdominal pain. Denies bloating, constipation, diarrhea or blood in the stool.  GU: Positive vaginal itching. Denies urgency, frequency, dysuria or blood in her urine.  No other specific complaints in a complete review of systems (except as listed in HPI above).  Objective:   Physical Exam  BP 118/78 mmHg  Pulse 80  Temp(Src) 97.7 F (36.5 C) (Oral)  Wt 232 lb (105.235 kg)  LMP 12/24/2006 (Approximate) Wt Readings from Last 3 Encounters:  01/31/16 232 lb (105.235 kg)  01/24/16 231 lb 8 oz (105.008 kg)  01/10/16 239 lb (108.41 kg)    General: Appears her stated age, in NAD. Skin: Warm, dry and intact. No rashes, lesions or ulcerations noted. Cardiovascular: Normal rate and rhythm. No murmurs noted. Lungs: Clear to auscultation bilaterally. No wheezes, rales or rhonchi noted. Abdomen: Soft, nontender. No CVAT B/L.    BMET    Component Value Date/Time   NA 137 01/24/2016 1406   K 4.3 01/24/2016 1406   CL 103 01/24/2016 1406   CO2 27 01/24/2016 1406   GLUCOSE 127* 01/24/2016 1406   BUN 10 01/24/2016 1406   CREATININE 0.96 01/24/2016 1406   CALCIUM 9.4 01/24/2016 1406   GFRNONAA >60 08/19/2015 0515   GFRAA >60 08/19/2015 0515    Lipid Panel     Component Value Date/Time   CHOL 178 01/10/2016 1438   TRIG 282.0* 01/10/2016 1438   HDL 45.90 01/10/2016 1438   CHOLHDL 4 01/10/2016 1438   VLDL 56.4* 01/10/2016 1438   LDLCALC 127* 12/23/2014 1033    CBC    Component Value Date/Time   WBC 14.7* 01/24/2016 1406   RBC 4.63 01/24/2016 1406   HGB 13.2 01/24/2016 1406   HCT 39.0 01/24/2016 1406   PLT 236.0 01/24/2016 1406   MCV 84.1 01/24/2016 1406   MCH 29.0 08/31/2015 1104   MCHC 33.9 01/24/2016 1406   RDW 14.1 01/24/2016 1406   LYMPHSABS 1.8 08/31/2015 1104   MONOABS 0.6 08/31/2015 1104   EOSABS 0.1 08/31/2015 1104   BASOSABS 0.0 08/31/2015 1104    Hgb A1C Lab Results  Component Value Date   HGBA1C 5.6 01/10/2016        Assessment & Plan:    Pyelonephritis:  Urinalysis normal Continue Cipro until completed  Antibiotic induced yeast infection:  Diflucan 150mg  tablet taken yesterday, pt told to call clinic if symptoms persist for a second dose    RTC as needed.

## 2016-01-31 NOTE — Patient Instructions (Signed)

## 2016-02-14 ENCOUNTER — Ambulatory Visit: Payer: Self-pay | Admitting: Internal Medicine

## 2016-02-15 ENCOUNTER — Encounter: Payer: Self-pay | Admitting: Internal Medicine

## 2016-02-16 MED ORDER — PHENTERMINE HCL 15 MG PO CAPS: 15.0000 mg | ORAL_CAPSULE | ORAL | Status: DC

## 2016-02-16 NOTE — Addendum Note (Signed)
Addended by: Lurlean Nanny on: 02/16/2016 08:33 AM   Modules accepted: Orders

## 2016-02-28 ENCOUNTER — Ambulatory Visit (INDEPENDENT_AMBULATORY_CARE_PROVIDER_SITE_OTHER): Payer: BLUE CROSS/BLUE SHIELD | Admitting: Internal Medicine

## 2016-02-28 ENCOUNTER — Encounter: Payer: Self-pay | Admitting: Internal Medicine

## 2016-02-28 VITALS — HR 78 | Temp 98.2°F | Wt 224.0 lb

## 2016-02-28 DIAGNOSIS — R635 Abnormal weight gain: Secondary | ICD-10-CM | POA: Diagnosis not present

## 2016-02-28 MED ORDER — PHENTERMINE HCL 37.5 MG PO CAPS: 37.5000 mg | ORAL_CAPSULE | ORAL | Status: DC

## 2016-02-28 NOTE — Progress Notes (Signed)
Pre visit review using our clinic review tool, if applicable. No additional management support is needed unless otherwise documented below in the visit note. 

## 2016-02-28 NOTE — Patient Instructions (Signed)

## 2016-02-28 NOTE — Progress Notes (Signed)
Subjective:    Patient ID: [name], female    DOB: 07-16-1966, 50 y.o.   MRN: [id number]  HPI  Pt presents to the clinic today for 1 month follow up for weight check/med refill. She was started on Phentermine at her last visit. Her starting weight was 232 lbs with a BMI of 38.01. She has been taking the medication as directed and denies adverse effects. Her weight today is 224 lbs with a BMI of 36.71.  Breakfast: oatmeal, applesauce, almond milk, eggs with veggies Lunch: homemade soup Dinner: lean meat, veggies, salad  Snacks: protein bar, yogurt. She is drinking 6-8 glasses of water per day  Exercise: She is walking 15 minutes twice daily, 3-4 days per week  Review of Systems      Past Medical History  Diagnosis Date  . Anxiety   . Depression   . History of colon polyps     2008- BENIGN  . Left ureteral stone   . History of nephrolithiasis   . History of abnormal cervical Pap smear     1991 -- 1995  . Wears glasses   . GERD (gastroesophageal reflux disease)   . Frequency of urination   . Urgency of urination   . Microhematuria   . Hydronephrosis, left   . PONV (postoperative nausea and vomiting)   . Kidney stones 2016    Current Outpatient Prescriptions  Medication Sig Dispense Refill  . acetaminophen (TYLENOL) 500 MG tablet Take 1,000 mg by mouth every 6 (six) hours as needed for moderate pain.    Marland Kitchen buPROPion (WELLBUTRIN XL) 300 MG 24 hr tablet TAKE 1 TABLET (300 MG TOTAL) BY MOUTH DAILY. 30 tablet 5  . cholecalciferol (VITAMIN D) 1000 units tablet Take 1,000 Units by mouth daily.    . ciprofloxacin (CIPRO) 500 MG tablet Take 1 tablet (500 mg total) by mouth 2 (two) times daily. 28 tablet 0  . conjugated estrogens (PREMARIN) vaginal cream Use 1/2 g vaginally every night at bed time for the first 2 weeks, then use 1/2 g vaginally two or three times per week. (Patient not taking: Reported on 01/31/2016) 30 g 4  . fluconazole (DIFLUCAN) 150 MG tablet Take 1 tablet  (150 mg total) by mouth once. (Patient not taking: Reported on 01/31/2016) 1 tablet 0  . HYDROcodone-acetaminophen (NORCO/VICODIN) 5-325 MG tablet Take 1 tablet by mouth every 6 (six) hours as needed for moderate pain. 30 tablet 0  . mirabegron ER (MYRBETRIQ) 50 MG TB24 tablet Take 1 tablet (50 mg total) by mouth daily. One po qd (Patient taking differently: Take 50 mg by mouth daily. ) 30 tablet 11  . Multiple Vitamin (MULTIVITAMIN WITH MINERALS) TABS tablet Take 1 tablet by mouth daily.    . Omega-3 Fatty Acids (FISH OIL PO) Take 1 capsule by mouth daily.    . phentermine 15 MG capsule Take 1 capsule (15 mg total) by mouth every morning. 30 capsule 0   No current facility-administered medications for this visit.    Allergies  Allergen Reactions  . Desvenlafaxine Other (See Comments)    REACTION: withdrawl  . Iohexol      Code: RASH, Desc: PT STATES BACK IN THE 70S SHE HAD IV DYE REACTION.04/30/07/RM---pt given omnipaque 300 w/out pre meds; no complications 0000000 slg, Onset Date: [id number]     Family History  Problem Relation Age of Onset  . Hypertension Mother   . Diabetes Father   . Cancer Paternal Uncle     Pancreas  .  Heart attack Maternal Grandmother   . Drug abuse Sister   . Colon cancer Neg Hx   . Esophageal cancer Neg Hx   . Stomach cancer Neg Hx   . Rectal cancer Neg Hx     Social History   Social History  . Marital Status: Married    Spouse Name: N/A  . Number of Children: N/A  . Years of Education: N/A   Occupational History  . Not on file.   Social History Main Topics  . Smoking status: Former Smoker -- 15 years    Types: Cigarettes    Quit date: 05/16/2008  . Smokeless tobacco: Never Used  . Alcohol Use: No  . Drug Use: No  . Sexual Activity:    Partners: Male    Birth Control/ Protection: Surgical     Comment: Hyst   Other Topics Concern  . Not on file   Social History Narrative     Constitutional: Denies fever, malaise, fatigue, headache or  abrupt weight changes.  Respiratory: Denies difficulty breathing, shortness of breath, cough or sputum production.   Cardiovascular: Denies chest pain, chest tightness, palpitations or swelling in the hands or feet.  Neurological: Denies dizziness, difficulty with memory, difficulty with speech or problems with balance and coordination.    No other specific complaints in a complete review of systems (except as listed in HPI above).  Objective:   Physical Exam  Pulse 78  Temp(Src) 98.2 F (36.8 C) (Oral)  Wt 224 lb (101.606 kg)  SpO2 99%  LMP 12/24/2006 (Approximate)  Wt Readings from Last 3 Encounters:  02/28/16 224 lb (101.606 kg)  01/31/16 232 lb (105.235 kg)  01/24/16 231 lb 8 oz (105.008 kg)    General: Appears her stated age, obese in NAD. Cardiovascular: Normal rate and rhythm. S1,S2 noted.  No murmur, rubs or gallops noted. Pulmonary/Chest: Normal effort and positive vesicular breath sounds. No respiratory distress. No wheezes, rales or ronchi noted.  Neurological: Alert and oriented.    BMET    Component Value Date/Time   NA 137 01/24/2016 1406   K 4.3 01/24/2016 1406   CL 103 01/24/2016 1406   CO2 27 01/24/2016 1406   GLUCOSE 127* 01/24/2016 1406   BUN 10 01/24/2016 1406   CREATININE 0.96 01/24/2016 1406   CALCIUM 9.4 01/24/2016 1406   GFRNONAA >60 08/19/2015 0515   GFRAA >60 08/19/2015 0515    Lipid Panel     Component Value Date/Time   CHOL 178 01/10/2016 1438   TRIG 282.0* 01/10/2016 1438   HDL 45.90 01/10/2016 1438   CHOLHDL 4 01/10/2016 1438   VLDL 56.4* 01/10/2016 1438   LDLCALC 127* 12/23/2014 1033    CBC    Component Value Date/Time   WBC 14.7* 01/24/2016 1406   RBC 4.63 01/24/2016 1406   HGB 13.2 01/24/2016 1406   HCT 39.0 01/24/2016 1406   PLT 236.0 01/24/2016 1406   MCV 84.1 01/24/2016 1406   MCH 29.0 08/31/2015 1104   MCHC 33.9 01/24/2016 1406   RDW 14.1 01/24/2016 1406   LYMPHSABS 1.8 08/31/2015 1104   MONOABS 0.6 08/31/2015  1104   EOSABS 0.1 08/31/2015 1104   BASOSABS 0.0 08/31/2015 1104    Hgb A1C Lab Results  Component Value Date   HGBA1C 5.6 01/10/2016         Assessment & Plan:  Abnormal Weight Gain:  Continue the next 56 days diet Discussed increasing the amount of exercise, adding in some light weights RX for Phentermine increased  to 37.5 mg daily  RTC in 1 month for weight check/med refill

## 2016-04-02 ENCOUNTER — Ambulatory Visit (INDEPENDENT_AMBULATORY_CARE_PROVIDER_SITE_OTHER): Payer: BLUE CROSS/BLUE SHIELD | Admitting: Internal Medicine

## 2016-04-02 ENCOUNTER — Encounter: Payer: Self-pay | Admitting: Internal Medicine

## 2016-04-02 VITALS — BP 122/82 | HR 70 | Temp 98.5°F | Wt 214.5 lb

## 2016-04-02 DIAGNOSIS — R635 Abnormal weight gain: Secondary | ICD-10-CM

## 2016-04-02 MED ORDER — PHENTERMINE HCL 37.5 MG PO CAPS: 37.5000 mg | ORAL_CAPSULE | ORAL | Status: DC

## 2016-04-02 NOTE — Progress Notes (Signed)
Subjective:    Patient ID: [name], female    DOB: 12/24/1966, 50 y.o.   MRN: [id number]  HPI   Pt presents to the clinic today for f/u weight check. She was started on Phentermine 37.5 mg, 01/217. Initial weight was 239 lbs, today's weight 214 lbs. She is taking the medication as prescribed. She denies side effects of Phentermine including nausea, palpitations, tachycardia, or dizziness. She just completed a "56-day diet," in which she did not eat added sugars, and increased lean meat and vegetable consumption.   Breakfast: oatmeal and berries, and almond milk Lunch: salads with grilled chicken or homemade soup Dinner: lean meat and vegetables Snack: apple, almonds Drink: decreased soda. Homemade lemonade, and water Exercise: Walking 20-25 min daily    Review of Systems  Past Medical History  Diagnosis Date  . Anxiety   . Depression   . History of colon polyps     2008- BENIGN  . Left ureteral stone   . History of nephrolithiasis   . History of abnormal cervical Pap smear     1991 -- 1995  . Wears glasses   . GERD (gastroesophageal reflux disease)   . Frequency of urination   . Urgency of urination   . Microhematuria   . Hydronephrosis, left   . PONV (postoperative nausea and vomiting)   . Kidney stones 2016    Current Outpatient Prescriptions  Medication Sig Dispense Refill  . buPROPion (WELLBUTRIN XL) 300 MG 24 hr tablet TAKE 1 TABLET (300 MG TOTAL) BY MOUTH DAILY. 30 tablet 5  . cholecalciferol (VITAMIN D) 1000 units tablet Take 1,000 Units by mouth daily.    Marland Kitchen conjugated estrogens (PREMARIN) vaginal cream Use 1/2 g vaginally every night at bed time for the first 2 weeks, then use 1/2 g vaginally two or three times per week. 30 g 4  . mirabegron ER (MYRBETRIQ) 50 MG TB24 tablet Take 1 tablet (50 mg total) by mouth daily. One po qd 30 tablet 11  . Multiple Vitamin (MULTIVITAMIN WITH MINERALS) TABS tablet Take 1 tablet by mouth daily.    . Omega-3 Fatty Acids  (FISH OIL PO) Take 1 capsule by mouth daily.    . phentermine 37.5 MG capsule Take 1 capsule (37.5 mg total) by mouth every morning. 30 capsule 0   No current facility-administered medications for this visit.    Allergies  Allergen Reactions  . Desvenlafaxine Other (See Comments)    REACTION: withdrawl  . Iohexol      Code: RASH, Desc: PT STATES BACK IN THE 70S SHE HAD IV DYE REACTION.04/30/07/RM---pt given omnipaque 300 w/out pre meds; no complications 0000000 slg, Onset Date: [id number]     Family History  Problem Relation Age of Onset  . Hypertension Mother   . Diabetes Father   . Cancer Paternal Uncle     Pancreas  . Heart attack Maternal Grandmother   . Drug abuse Sister   . Colon cancer Neg Hx   . Esophageal cancer Neg Hx   . Stomach cancer Neg Hx   . Rectal cancer Neg Hx     Social History   Social History  . Marital Status: Married    Spouse Name: N/A  . Number of Children: N/A  . Years of Education: N/A   Occupational History  . Not on file.   Social History Main Topics  . Smoking status: Former Smoker -- 15 years    Types: Cigarettes    Quit date: 05/16/2008  .  Smokeless tobacco: Never Used  . Alcohol Use: No  . Drug Use: No  . Sexual Activity:    Partners: Male    Birth Control/ Protection: Surgical     Comment: Hyst   Other Topics Concern  . Not on file   Social History Narrative     Constitutional: Denies fatigue, headache or abrupt weight changes.  Respiratory: Denies difficulty breathing, shortness of breath, or cough   Cardiovascular: Denies chest pain, chest tightness, or palpitations Gastrointestinal: Denies N/V/D/C  No other specific complaints in a complete review of systems (except as listed in HPI above).     Objective:   Physical Exam BP 122/82 mmHg  Pulse 70  Temp(Src) 98.5 F (36.9 C) (Oral)  Wt 214 lb 8 oz (97.297 kg)  SpO2 96%  LMP 12/24/2006 (Approximate) Wt Readings from Last 3 Encounters:  04/02/16 214 lb 8 oz  (97.297 kg)  02/28/16 224 lb (101.606 kg)  01/31/16 232 lb (105.235 kg)    General: Appears her stated age, obese in NAD. Cardiovascular: Normal rate and rhythm. S1,S2 noted.  No murmur, rubs or gallops noted. Pulmonary/Chest: Normal effort and positive vesicular breath sounds. No respiratory distress. No wheezes, rales or ronchi noted.  Neuro: Alert and oriented.  BMET    Component Value Date/Time   NA 137 01/24/2016 1406   K 4.3 01/24/2016 1406   CL 103 01/24/2016 1406   CO2 27 01/24/2016 1406   GLUCOSE 127* 01/24/2016 1406   BUN 10 01/24/2016 1406   CREATININE 0.96 01/24/2016 1406   CALCIUM 9.4 01/24/2016 1406   GFRNONAA >60 08/19/2015 0515   GFRAA >60 08/19/2015 0515    Lipid Panel     Component Value Date/Time   CHOL 178 01/10/2016 1438   TRIG 282.0* 01/10/2016 1438   HDL 45.90 01/10/2016 1438   CHOLHDL 4 01/10/2016 1438   VLDL 56.4* 01/10/2016 1438   LDLCALC 127* 12/23/2014 1033    CBC    Component Value Date/Time   WBC 14.7* 01/24/2016 1406   RBC 4.63 01/24/2016 1406   HGB 13.2 01/24/2016 1406   HCT 39.0 01/24/2016 1406   PLT 236.0 01/24/2016 1406   MCV 84.1 01/24/2016 1406   MCH 29.0 08/31/2015 1104   MCHC 33.9 01/24/2016 1406   RDW 14.1 01/24/2016 1406   LYMPHSABS 1.8 08/31/2015 1104   MONOABS 0.6 08/31/2015 1104   EOSABS 0.1 08/31/2015 1104   BASOSABS 0.0 08/31/2015 1104    Hgb A1C Lab Results  Component Value Date   HGBA1C 5.6 01/10/2016          Assessment & Plan:  Abnormal weight gain:  Continue Phentermine 37.5 mg Continue changes in diet Increase walking intensity to reach your target heart rate while exercising  Return in 1 month for weight check/med refill

## 2016-04-02 NOTE — Patient Instructions (Signed)

## 2016-04-02 NOTE — Progress Notes (Signed)
Pre visit review using our clinic review tool, if applicable. No additional management support is needed unless otherwise documented below in the visit note. 

## 2016-04-03 ENCOUNTER — Other Ambulatory Visit: Payer: Self-pay

## 2016-04-03 ENCOUNTER — Encounter: Payer: Self-pay | Admitting: Internal Medicine

## 2016-04-03 DIAGNOSIS — Z1231 Encounter for screening mammogram for malignant neoplasm of breast: Secondary | ICD-10-CM

## 2016-04-23 ENCOUNTER — Ambulatory Visit
Admission: RE | Admit: 2016-04-23 | Discharge: 2016-04-23 | Disposition: A | Discharge status: Home / Self Care | Payer: BLUE CROSS/BLUE SHIELD | Source: Ambulatory Visit

## 2016-04-23 DIAGNOSIS — Z1231 Encounter for screening mammogram for malignant neoplasm of breast: Secondary | ICD-10-CM

## 2016-04-24 ENCOUNTER — Encounter: Payer: Self-pay | Admitting: Internal Medicine

## 2016-05-07 ENCOUNTER — Encounter: Payer: Self-pay | Admitting: Internal Medicine

## 2016-05-08 ENCOUNTER — Ambulatory Visit: Payer: Self-pay | Admitting: Internal Medicine

## 2016-05-10 ENCOUNTER — Encounter: Payer: Self-pay | Admitting: Internal Medicine

## 2016-05-10 ENCOUNTER — Ambulatory Visit (INDEPENDENT_AMBULATORY_CARE_PROVIDER_SITE_OTHER): Payer: BLUE CROSS/BLUE SHIELD | Admitting: Internal Medicine

## 2016-05-10 VITALS — BP 120/70 | HR 82 | Temp 98.1°F | Wt 217.0 lb

## 2016-05-10 DIAGNOSIS — N3289 Other specified disorders of bladder: Secondary | ICD-10-CM | POA: Diagnosis not present

## 2016-05-10 DIAGNOSIS — R635 Abnormal weight gain: Secondary | ICD-10-CM

## 2016-05-10 DIAGNOSIS — F411 Generalized anxiety disorder: Secondary | ICD-10-CM

## 2016-05-10 DIAGNOSIS — R3989 Other symptoms and signs involving the genitourinary system: Secondary | ICD-10-CM

## 2016-05-10 MED ORDER — ALPRAZOLAM 0.5 MG PO TABS: 0.5000 mg | ORAL_TABLET | Freq: Every day | ORAL | Status: DC | PRN

## 2016-05-10 MED ORDER — PHENTERMINE HCL 37.5 MG PO CAPS: 37.5000 mg | ORAL_CAPSULE | ORAL | Status: DC

## 2016-05-10 NOTE — Patient Instructions (Signed)

## 2016-05-10 NOTE — Progress Notes (Signed)
Subjective:    Patient ID: [name], female    DOB: 07/22/1966, 50 y.o.   MRN: [id number]  HPI   Pt presents to the clinic today for f/u weight check. She was started on Phentermine 37.5 mg, 01/217. Initial weight was 239 lbs, today's weight 217 lbs.  She gained 3 lbs since her last visit. She is taking the medication as prescribed. She denies side effects of Phentermine including nausea, palpitations, tachycardia, or dizziness. She has started eating sugar again.  Breakfast: oatmeal and berries, and almond milk Lunch: salads with grilled chicken or homemade soup Dinner: some fast food Snack: apple, almonds Drink: decreased soda. Homemade lemonade, and water Exercise: None  She also wants me to recheck her urine. She reports she still has bladder pressure, and continues to urinate even after she thought she had finished voiding. She is on Myrbetriq for OAB. She had pyelonephritis 01/2016.  She wants to discuss her anxiety. This has been a chronic issue for her but seems to be getting worse. She is not sure what is triggers it. She feels like she is not worthy of her husband or her job. She has trouble falling and staying asleep. She is fearful and scared that something bad is going to happen to her or her children. She has racing thoughts. She is on Wellbutrin but reports she missed a few doses over the last month. She did take Xanax that she had leftover and reports that provided some relief. She denies depression, SI/HI.   Review of Systems  Past Medical History  Diagnosis Date  . Anxiety   . Depression   . History of colon polyps     2008- BENIGN  . Left ureteral stone   . History of nephrolithiasis   . History of abnormal cervical Pap smear     1991 -- 1995  . Wears glasses   . GERD (gastroesophageal reflux disease)   . Frequency of urination   . Urgency of urination   . Microhematuria   . Hydronephrosis, left   . PONV (postoperative nausea and vomiting)   . Kidney  stones 2016    Current Outpatient Prescriptions  Medication Sig Dispense Refill  . buPROPion (WELLBUTRIN XL) 300 MG 24 hr tablet TAKE 1 TABLET (300 MG TOTAL) BY MOUTH DAILY. 30 tablet 5  . cholecalciferol (VITAMIN D) 1000 units tablet Take 1,000 Units by mouth daily.    Marland Kitchen conjugated estrogens (PREMARIN) vaginal cream Use 1/2 g vaginally every night at bed time for the first 2 weeks, then use 1/2 g vaginally two or three times per week. 30 g 4  . mirabegron ER (MYRBETRIQ) 50 MG TB24 tablet Take 1 tablet (50 mg total) by mouth daily. One po qd 30 tablet 11  . Multiple Vitamin (MULTIVITAMIN WITH MINERALS) TABS tablet Take 1 tablet by mouth daily.    . Omega-3 Fatty Acids (FISH OIL PO) Take 1 capsule by mouth daily.    . phentermine 37.5 MG capsule Take 1 capsule (37.5 mg total) by mouth every morning. 30 capsule 0   No current facility-administered medications for this visit.    Allergies  Allergen Reactions  . Desvenlafaxine Other (See Comments)    REACTION: withdrawl  . Iohexol      Code: RASH, Desc: PT STATES BACK IN THE 70S SHE HAD IV DYE REACTION.04/30/07/RM---pt given omnipaque 300 w/out pre meds; no complications 0000000 slg, Onset Date: [id number]     Family History  Problem Relation Age of Onset  .  Hypertension Mother   . Diabetes Father   . Cancer Paternal Uncle     Pancreas  . Heart attack Maternal Grandmother   . Drug abuse Sister   . Colon cancer Neg Hx   . Esophageal cancer Neg Hx   . Stomach cancer Neg Hx   . Rectal cancer Neg Hx     Social History   Social History  . Marital Status: Married    Spouse Name: N/A  . Number of Children: N/A  . Years of Education: N/A   Occupational History  . Not on file.   Social History Main Topics  . Smoking status: Former Smoker -- 15 years    Types: Cigarettes    Quit date: 05/16/2008  . Smokeless tobacco: Never Used  . Alcohol Use: No  . Drug Use: No  . Sexual Activity:    Partners: Male    Birth Control/  Protection: Surgical     Comment: Hyst   Other Topics Concern  . Not on file   Social History Narrative     Constitutional: Denies fatigue, headache or abrupt weight changes.  Respiratory: Denies difficulty breathing, shortness of breath, or cough   Cardiovascular: Denies chest pain, chest tightness, or palpitations Gastrointestinal: Denies nausea, vomiting, diarrhea or constipation. Genitourinary: Pt reports bladder pressure. Denies urgency, frequency, dysuria or blood in her urine. Neuro: Pt reports insomnia. Denies difficulty with memory or speech. Psych: Pt reports anxiety. Denies depression, SI/HI.   No other specific complaints in a complete review of systems (except as listed in HPI above).     Objective:   Physical Exam BP 120/70 mmHg  Pulse 82  Temp(Src) 98.1 F (36.7 C) (Oral)  Wt 217 lb (98.431 kg)  SpO2 98%  LMP 12/24/2006 (Approximate)  Wt Readings from Last 3 Encounters:  05/10/16 217 lb (98.431 kg)  04/02/16 214 lb 8 oz (97.297 kg)  02/28/16 224 lb (101.606 kg)    General: Appears her stated age, obese in NAD. Cardiovascular: Normal rate and rhythm. S1,S2 noted.  No murmur, rubs or gallops noted. Pulmonary/Chest: Normal effort and positive vesicular breath sounds. No respiratory distress. No wheezes, rales or ronchi noted.  Neuro: Alert and oriented. Abdomen: Soft, nontender. No CVA tenderness. Psych: Mood and affect mildly flat. Behavior and thought content are appropriate.  BMET    Component Value Date/Time   NA 137 01/24/2016 1406   K 4.3 01/24/2016 1406   CL 103 01/24/2016 1406   CO2 27 01/24/2016 1406   GLUCOSE 127* 01/24/2016 1406   BUN 10 01/24/2016 1406   CREATININE 0.96 01/24/2016 1406   CALCIUM 9.4 01/24/2016 1406   GFRNONAA >60 08/19/2015 0515   GFRAA >60 08/19/2015 0515    Lipid Panel     Component Value Date/Time   CHOL 178 01/10/2016 1438   TRIG 282.0* 01/10/2016 1438   HDL 45.90 01/10/2016 1438   CHOLHDL 4 01/10/2016 1438    VLDL 56.4* 01/10/2016 1438   LDLCALC 127* 12/23/2014 1033    CBC    Component Value Date/Time   WBC 14.7* 01/24/2016 1406   RBC 4.63 01/24/2016 1406   HGB 13.2 01/24/2016 1406   HCT 39.0 01/24/2016 1406   PLT 236.0 01/24/2016 1406   MCV 84.1 01/24/2016 1406   MCH 29.0 08/31/2015 1104   MCHC 33.9 01/24/2016 1406   RDW 14.1 01/24/2016 1406   LYMPHSABS 1.8 08/31/2015 1104   MONOABS 0.6 08/31/2015 1104   EOSABS 0.1 08/31/2015 1104   BASOSABS 0.0 08/31/2015 1104  Hgb A1C Lab Results  Component Value Date   HGBA1C 5.6 01/10/2016          Assessment & Plan:  Abnormal weight gain:  Continue Phentermine 37.5 mg- refilled today Encouraged her to get back on her low carb, low sugar diet She plans to join [employer] and start working out 3 days per week  Bladder pressure:  Urinalysis normal Continue Myrbetriq  Anxiety:  Deteriorated Continue Wellbutrin- try to remember to take every day RX for Xanax 0.5 mg daily prn  Return in 1 month for weight check/med refill Webb Silversmith, NP

## 2016-05-14 LAB — POC URINALSYSI DIPSTICK (AUTOMATED)
Bilirubin, UA: NEGATIVE
Blood, UA: NEGATIVE
Glucose, UA: NEGATIVE
Ketones, UA: NEGATIVE
Leukocytes, UA: NEGATIVE
Nitrite, UA: NEGATIVE
Protein, UA: NEGATIVE
Spec Grav, UA: <=1.005
Urobilinogen, UA: NEGATIVE
pH, UA: 6.5

## 2016-05-14 NOTE — Addendum Note (Signed)
Addended by: Lurlean Nanny on: 05/14/2016 04:32 PM   Modules accepted: Orders

## 2016-05-15 DIAGNOSIS — Z0000 Encounter for general adult medical examination without abnormal findings: Secondary | ICD-10-CM | POA: Diagnosis not present

## 2016-05-15 DIAGNOSIS — R3989 Other symptoms and signs involving the genitourinary system: Secondary | ICD-10-CM | POA: Diagnosis not present

## 2016-05-15 DIAGNOSIS — N200 Calculus of kidney: Secondary | ICD-10-CM | POA: Diagnosis not present

## 2016-05-15 DIAGNOSIS — M545 Low back pain: Secondary | ICD-10-CM | POA: Diagnosis not present

## 2016-05-15 DIAGNOSIS — R350 Frequency of micturition: Secondary | ICD-10-CM | POA: Diagnosis not present

## 2016-05-15 DIAGNOSIS — R3129 Other microscopic hematuria: Secondary | ICD-10-CM | POA: Diagnosis not present

## 2016-06-11 ENCOUNTER — Ambulatory Visit: Payer: Self-pay | Admitting: Internal Medicine

## 2016-06-11 DIAGNOSIS — Z0289 Encounter for other administrative examinations: Secondary | ICD-10-CM

## 2016-06-13 DIAGNOSIS — R3914 Feeling of incomplete bladder emptying: Secondary | ICD-10-CM | POA: Diagnosis not present

## 2016-06-13 DIAGNOSIS — N3946 Mixed incontinence: Secondary | ICD-10-CM | POA: Diagnosis not present

## 2016-06-13 DIAGNOSIS — R350 Frequency of micturition: Secondary | ICD-10-CM | POA: Diagnosis not present

## 2016-06-20 DIAGNOSIS — R3914 Feeling of incomplete bladder emptying: Secondary | ICD-10-CM | POA: Diagnosis not present

## 2016-06-20 DIAGNOSIS — R350 Frequency of micturition: Secondary | ICD-10-CM | POA: Diagnosis not present

## 2016-07-02 DIAGNOSIS — N3000 Acute cystitis without hematuria: Secondary | ICD-10-CM | POA: Diagnosis not present

## 2016-07-02 DIAGNOSIS — R8271 Bacteriuria: Secondary | ICD-10-CM | POA: Diagnosis not present

## 2016-07-09 ENCOUNTER — Encounter: Payer: Self-pay | Admitting: Internal Medicine

## 2016-07-09 MED ORDER — BUPROPION HCL ER (XL) 150 MG PO TB24: 150.0000 mg | ORAL_TABLET | Freq: Every day | ORAL | Status: DC

## 2016-07-16 DIAGNOSIS — N3000 Acute cystitis without hematuria: Secondary | ICD-10-CM | POA: Diagnosis not present

## 2016-07-16 DIAGNOSIS — N3946 Mixed incontinence: Secondary | ICD-10-CM | POA: Diagnosis not present

## 2016-07-16 DIAGNOSIS — R3914 Feeling of incomplete bladder emptying: Secondary | ICD-10-CM | POA: Diagnosis not present

## 2016-08-24 DIAGNOSIS — M545 Low back pain: Secondary | ICD-10-CM | POA: Diagnosis not present

## 2016-08-24 DIAGNOSIS — K5730 Diverticulosis of large intestine without perforation or abscess without bleeding: Secondary | ICD-10-CM | POA: Diagnosis not present

## 2016-08-29 DIAGNOSIS — N3000 Acute cystitis without hematuria: Secondary | ICD-10-CM | POA: Diagnosis not present

## 2016-08-29 DIAGNOSIS — N1330 Unspecified hydronephrosis: Secondary | ICD-10-CM | POA: Diagnosis not present

## 2016-08-31 DIAGNOSIS — M1611 Unilateral primary osteoarthritis, right hip: Secondary | ICD-10-CM | POA: Diagnosis not present

## 2016-08-31 DIAGNOSIS — M1612 Unilateral primary osteoarthritis, left hip: Secondary | ICD-10-CM | POA: Diagnosis not present

## 2016-08-31 DIAGNOSIS — M5136 Other intervertebral disc degeneration, lumbar region: Secondary | ICD-10-CM | POA: Diagnosis not present

## 2016-08-31 DIAGNOSIS — M160 Bilateral primary osteoarthritis of hip: Secondary | ICD-10-CM | POA: Diagnosis not present

## 2016-09-07 DIAGNOSIS — M542 Cervicalgia: Secondary | ICD-10-CM | POA: Diagnosis not present

## 2016-09-19 DIAGNOSIS — M542 Cervicalgia: Secondary | ICD-10-CM | POA: Diagnosis not present

## 2016-09-20 DIAGNOSIS — M542 Cervicalgia: Secondary | ICD-10-CM | POA: Diagnosis not present

## 2016-09-24 DIAGNOSIS — M542 Cervicalgia: Secondary | ICD-10-CM | POA: Diagnosis not present

## 2016-10-03 DIAGNOSIS — M5136 Other intervertebral disc degeneration, lumbar region: Secondary | ICD-10-CM | POA: Diagnosis not present

## 2016-10-03 DIAGNOSIS — M4686 Other specified inflammatory spondylopathies, lumbar region: Secondary | ICD-10-CM | POA: Diagnosis not present

## 2016-10-03 DIAGNOSIS — M160 Bilateral primary osteoarthritis of hip: Secondary | ICD-10-CM | POA: Diagnosis not present

## 2016-11-29 ENCOUNTER — Encounter: Payer: Self-pay | Admitting: Internal Medicine

## 2016-11-29 ENCOUNTER — Ambulatory Visit (INDEPENDENT_AMBULATORY_CARE_PROVIDER_SITE_OTHER): Payer: BLUE CROSS/BLUE SHIELD | Admitting: Internal Medicine

## 2016-11-29 VITALS — BP 124/78 | HR 78 | Temp 98.6°F | Wt 233.8 lb

## 2016-11-29 DIAGNOSIS — J029 Acute pharyngitis, unspecified: Secondary | ICD-10-CM | POA: Diagnosis not present

## 2016-11-29 DIAGNOSIS — J020 Streptococcal pharyngitis: Secondary | ICD-10-CM

## 2016-11-29 MED ORDER — AMOXICILLIN 500 MG PO CAPS: 500.0000 mg | ORAL_CAPSULE | Freq: Three times a day (TID) | ORAL | 0 refills | Status: DC

## 2016-11-29 NOTE — Patient Instructions (Signed)

## 2016-11-29 NOTE — Progress Notes (Signed)
Subjective:    Patient ID: [name], female    DOB: 17-Jul-1966, 50 y.o.   MRN: [id number]  HPI  Pt presents to the clinic today with c/o sore throat and cough. This started yesterday. She is having difficulty swallowing. She reports it feels like she is swallowing "pins and needles". The cough is nonproductive. She denies fever, chills or body aches. She has taken cough drops without much relief. She has not had sick contacts that she is aware of.  Review of Systems      Past Medical History:  Diagnosis Date  . Anxiety   . Depression   . Frequency of urination   . GERD (gastroesophageal reflux disease)   . History of abnormal cervical Pap smear    1991 -- 1995  . History of colon polyps    2008- BENIGN  . History of nephrolithiasis   . Hydronephrosis, left   . Kidney stones 2016  . Left ureteral stone   . Microhematuria   . PONV (postoperative nausea and vomiting)   . Urgency of urination   . Wears glasses     Current Outpatient Prescriptions  Medication Sig Dispense Refill  . ALPRAZolam (XANAX) 0.5 MG tablet Take 1 tablet (0.5 mg total) by mouth daily as needed for anxiety. 20 tablet 0  . buPROPion (WELLBUTRIN XL) 150 MG 24 hr tablet Take 1 tablet (150 mg total) by mouth daily. 30 tablet 5  . cholecalciferol (VITAMIN D) 1000 units tablet Take 1,000 Units by mouth daily.    . Multiple Vitamin (MULTIVITAMIN WITH MINERALS) TABS tablet Take 1 tablet by mouth daily.    . Omega-3 Fatty Acids (FISH OIL PO) Take 1 capsule by mouth daily.     No current facility-administered medications for this visit.     Allergies  Allergen Reactions  . Desvenlafaxine Other (See Comments)    REACTION: withdrawl  . Iohexol      Code: RASH, Desc: PT STATES BACK IN THE 70S SHE HAD IV DYE REACTION.04/30/07/RM---pt given omnipaque 300 w/out pre meds; no complications 0000000 slg, Onset Date: [id number]     Family History  Problem Relation Age of Onset  . Hypertension Mother   . Diabetes  Father   . Cancer Paternal Uncle     Pancreas  . Heart attack Maternal Grandmother   . Drug abuse Sister   . Colon cancer Neg Hx   . Esophageal cancer Neg Hx   . Stomach cancer Neg Hx   . Rectal cancer Neg Hx     Social History   Social History  . Marital status: Married    Spouse name: N/A  . Number of children: N/A  . Years of education: N/A   Occupational History  . Not on file.   Social History Main Topics  . Smoking status: Former Smoker    Years: 15.00    Types: Cigarettes    Quit date: 05/16/2008  . Smokeless tobacco: Never Used  . Alcohol use No  . Drug use: No  . Sexual activity: Yes    Partners: Male    Birth control/ protection: Surgical     Comment: Hyst   Other Topics Concern  . Not on file   Social History Narrative  . No narrative on file     Constitutional: Denies fever, malaise, fatigue, headache or abrupt weight changes.  HEENT: Pt reports sore throat. Denies eye pain, eye redness, ear pain, ringing in the ears, wax buildup, runny nose, nasal congestion, bloody  nose. Respiratory: Pt reports cough. Denies difficulty breathing, shortness of breath, or sputum production.     No other specific complaints in a complete review of systems (except as listed in HPI above).  Objective:   Physical Exam  BP 124/78   Pulse 78   Temp 98.6 F (37 C) (Oral)   Wt 233 lb 12 oz (106 kg)   LMP 12/24/2006 (Approximate)   SpO2 97%   BMI 38.31 kg/m  Wt Readings from Last 3 Encounters:  11/29/16 233 lb 12 oz (106 kg)  05/10/16 217 lb (98.4 kg)  04/02/16 214 lb 8 oz (97.3 kg)    General: Appears her stated age,obese in NAD. HEENT: Head: normal shape and size, no sinus tenderness noted; Ears: scarring noted on TM; Throat/Mouth: Teeth present, mucosa erythematous and moist, no exudate, lesions or ulcerations noted.  Neck:  Anterior cervical adenopathy noted on the right.  Cardiovascular: Normal rate and rhythm.  Pulmonary/Chest: Normal effort and positive  vesicular breath sounds. No respiratory distress. No wheezes, rales or ronchi noted.   BMET    Component Value Date/Time   NA 137 01/24/2016 1406   K 4.3 01/24/2016 1406   CL 103 01/24/2016 1406   CO2 27 01/24/2016 1406   GLUCOSE 127 (H) 01/24/2016 1406   BUN 10 01/24/2016 1406   CREATININE 0.96 01/24/2016 1406   CALCIUM 9.4 01/24/2016 1406   GFRNONAA >60 08/19/2015 0515   GFRAA >60 08/19/2015 0515    Lipid Panel     Component Value Date/Time   CHOL 178 01/10/2016 1438   TRIG 282.0 (H) 01/10/2016 1438   HDL 45.90 01/10/2016 1438   CHOLHDL 4 01/10/2016 1438   VLDL 56.4 (H) 01/10/2016 1438   LDLCALC 127 (H) 12/23/2014 1033    CBC    Component Value Date/Time   WBC 14.7 (H) 01/24/2016 1406   RBC 4.63 01/24/2016 1406   HGB 13.2 01/24/2016 1406   HCT 39.0 01/24/2016 1406   PLT 236.0 01/24/2016 1406   MCV 84.1 01/24/2016 1406   MCH 29.0 08/31/2015 1104   MCHC 33.9 01/24/2016 1406   RDW 14.1 01/24/2016 1406   LYMPHSABS 1.8 08/31/2015 1104   MONOABS 0.6 08/31/2015 1104   EOSABS 0.1 08/31/2015 1104   BASOSABS 0.0 08/31/2015 1104    Hgb A1C Lab Results  Component Value Date   HGBA1C 5.6 01/10/2016            Assessment & Plan:   Strep throat:  RST: Positive eRx for Amoxil TID x 10 days Ibuprofen 600 mg TID prn with food  RTC as needed or if symptoms persist or worsen Alyssa Mancera, NP

## 2016-11-30 ENCOUNTER — Encounter: Payer: Self-pay | Admitting: Internal Medicine

## 2016-12-06 LAB — POCT RAPID STREP A (OFFICE): Rapid Strep A Screen: POSITIVE — AB

## 2016-12-06 NOTE — Addendum Note (Signed)
Addended by: Lurlean Nanny on: 12/06/2016 09:40 AM   Modules accepted: Orders

## 2016-12-12 ENCOUNTER — Telehealth: Payer: Self-pay | Admitting: Internal Medicine

## 2016-12-12 NOTE — Telephone Encounter (Signed)
Unable to contact pt by phone. No appt has been scheduled per pts appt calendar.

## 2016-12-12 NOTE — Telephone Encounter (Signed)
Called pt, no answer and VM did not picked up, unable to reach pt

## 2016-12-12 NOTE — Telephone Encounter (Signed)
Patient Name: [name]  DOB: [birth year]    Initial Comment Caller states she is having pains on her right side. Abdominal pain under her ribs.    Nurse Assessment  Nurse: Leilani Merl, RN, Heather Date/Time (Eastern Time): 12/12/2016 9:38:40 AM  Confirm and document reason for call. If symptomatic, describe symptoms. ---Caller states she is having pains on her right side. Abdominal pain under her ribs. It started at 430 this morning. She started with nausea before she went to bed last night.  Does the patient have any new or worsening symptoms? ---Yes  Will a triage be completed? ---Yes  Related visit to physician within the last 2 weeks? ---No  Does the PT have any chronic conditions? (i.e. diabetes, asthma, etc.) ---Yes  List chronic conditions. ---See MR  Is the patient pregnant or possibly pregnant? (Ask all females between the ages of 56-55) ---No  Is this a behavioral health or substance abuse call? ---No     Guidelines    Guideline Title Affirmed Question Affirmed Notes  Abdominal Pain - Female [1] MILD-MODERATE pain AND [2] constant AND [3] present > 2 hours    Final Disposition User   See Physician within 4 Hours (or PCP triage) Leilani Merl, RN, Heather    Referrals  GO TO FACILITY UNDECIDED   Disagree/Comply: Comply

## 2016-12-12 NOTE — Telephone Encounter (Signed)
Can you call and check on her 

## 2016-12-13 DIAGNOSIS — N200 Calculus of kidney: Secondary | ICD-10-CM | POA: Diagnosis not present

## 2016-12-20 ENCOUNTER — Ambulatory Visit (INDEPENDENT_AMBULATORY_CARE_PROVIDER_SITE_OTHER): Payer: BLUE CROSS/BLUE SHIELD | Admitting: Internal Medicine

## 2016-12-20 ENCOUNTER — Encounter: Payer: Self-pay | Admitting: Internal Medicine

## 2016-12-20 VITALS — BP 120/78 | HR 90 | Temp 97.7°F | Wt 233.0 lb

## 2016-12-20 DIAGNOSIS — R109 Unspecified abdominal pain: Secondary | ICD-10-CM | POA: Diagnosis not present

## 2016-12-20 DIAGNOSIS — R102 Pelvic and perineal pain: Secondary | ICD-10-CM | POA: Diagnosis not present

## 2016-12-20 LAB — POC URINALSYSI DIPSTICK (AUTOMATED)
Bilirubin, UA: NEGATIVE
Glucose, UA: NEGATIVE
Ketones, UA: NEGATIVE
Leukocytes, UA: NEGATIVE
Nitrite, UA: NEGATIVE
Protein, UA: NEGATIVE
Spec Grav, UA: >=1.03
Urobilinogen, UA: NEGATIVE
pH, UA: 6

## 2016-12-20 MED ORDER — CIPROFLOXACIN HCL 500 MG PO TABS: 500.0000 mg | ORAL_TABLET | Freq: Two times a day (BID) | ORAL | 0 refills | Status: DC

## 2016-12-20 NOTE — Addendum Note (Signed)
Addended by: Lurlean Nanny on: 12/20/2016 03:52 PM   Modules accepted: Orders

## 2016-12-20 NOTE — Progress Notes (Signed)
Subjective:    Patient ID: [name], female    DOB: Aug 21, 1966, 50 y.o.   MRN: [id number]  HPI  Pt presents to the clinic today with c/o right flank pain and right lower abdominal pain. This started 1 week ago. She describes the pain achy, but can be sharp and stabbing. The pain is worse with eating. Her bowels are moving normally. She denies nausea, vomiting or diarrhea. She went to UC last week for the same, was diagnosed with kidney stones, treated with Flomax, Norco and Zofran. She reports they also called her Tuesday and told her that her urine culture came back positive, and they put her on Pen VK. She has noticed some improvement in her symptoms since last week.   Review of Systems      Past Medical History:  Diagnosis Date  . Anxiety   . Depression   . Frequency of urination   . GERD (gastroesophageal reflux disease)   . History of abnormal cervical Pap smear    1991 -- 1995  . History of colon polyps    2008- BENIGN  . History of nephrolithiasis   . Hydronephrosis, left   . Kidney stones 2016  . Left ureteral stone   . Microhematuria   . PONV (postoperative nausea and vomiting)   . Urgency of urination   . Wears glasses     Current Outpatient Prescriptions  Medication Sig Dispense Refill  . ALPRAZolam (XANAX) 0.5 MG tablet Take 1 tablet (0.5 mg total) by mouth daily as needed for anxiety. 20 tablet 0  . buPROPion (WELLBUTRIN XL) 150 MG 24 hr tablet Take 1 tablet (150 mg total) by mouth daily. 30 tablet 5  . cholecalciferol (VITAMIN D) 1000 units tablet Take 1,000 Units by mouth daily.    . Multiple Vitamin (MULTIVITAMIN WITH MINERALS) TABS tablet Take 1 tablet by mouth daily.    . Omega-3 Fatty Acids (FISH OIL PO) Take 1 capsule by mouth daily.    . ondansetron (ZOFRAN-ODT) 4 MG disintegrating tablet DISSOLVE 1 T PO Q 6 H PRN  0  . oxyCODONE (OXY IR/ROXICODONE) 5 MG immediate release tablet TK 1 TO 3 TS PO Q 4 H PRN  0  . penicillin v potassium (VEETID) 500 MG  tablet Take 500 mg by mouth 2 (two) times daily.     . tamsulosin (FLOMAX) 0.4 MG CAPS capsule TK 1 C PO QD  0   No current facility-administered medications for this visit.     Allergies  Allergen Reactions  . Desvenlafaxine Other (See Comments)    REACTION: withdrawl  . Iohexol      Code: RASH, Desc: PT STATES BACK IN THE 70S SHE HAD IV DYE REACTION.04/30/07/RM---pt given omnipaque 300 w/out pre meds; no complications 0000000 slg, Onset Date: [id number]     Family History  Problem Relation Age of Onset  . Hypertension Mother   . Diabetes Father   . Cancer Paternal Uncle     Pancreas  . Heart attack Maternal Grandmother   . Drug abuse Sister   . Colon cancer Neg Hx   . Esophageal cancer Neg Hx   . Stomach cancer Neg Hx   . Rectal cancer Neg Hx     Social History   Social History  . Marital status: Married    Spouse name: N/A  . Number of children: N/A  . Years of education: N/A   Occupational History  . Not on file.   Social History Main Topics  .  Smoking status: Former Smoker    Years: 15.00    Types: Cigarettes    Quit date: 05/16/2008  . Smokeless tobacco: Never Used  . Alcohol use No  . Drug use: No  . Sexual activity: Yes    Partners: Male    Birth control/ protection: Surgical     Comment: Hyst   Other Topics Concern  . Not on file   Social History Narrative  . No narrative on file     Constitutional: Denies fever, malaise, fatigue, headache or abrupt weight changes.  Gastrointestinal: Pt reports abdominal pain. Denies bloating, constipation, diarrhea or blood in the stool.  GU: Pt reports flank pain. Denies urgency, frequency, pain with urination, burning sensation, blood in urine, odor or discharge.  No other specific complaints in a complete review of systems (except as listed in HPI above).  Objective:   Physical Exam  BP 120/78   Pulse 90   Temp 97.7 F (36.5 C) (Oral)   Wt 233 lb (105.7 kg)   LMP 12/24/2006 (Approximate)   BMI 38.18  kg/m  Wt Readings from Last 3 Encounters:  12/20/16 233 lb (105.7 kg)  11/29/16 233 lb 12 oz (106 kg)  05/10/16 217 lb (98.4 kg)    General: Appears her stated age, obese in NAD. Abdomen: Soft and tender over the bladder. No distention or masses noted. + CVA tenderness on the right.   BMET    Component Value Date/Time   NA 137 01/24/2016 1406   K 4.3 01/24/2016 1406   CL 103 01/24/2016 1406   CO2 27 01/24/2016 1406   GLUCOSE 127 (H) 01/24/2016 1406   BUN 10 01/24/2016 1406   CREATININE 0.96 01/24/2016 1406   CALCIUM 9.4 01/24/2016 1406   GFRNONAA >60 08/19/2015 0515   GFRAA >60 08/19/2015 0515    Lipid Panel     Component Value Date/Time   CHOL 178 01/10/2016 1438   TRIG 282.0 (H) 01/10/2016 1438   HDL 45.90 01/10/2016 1438   CHOLHDL 4 01/10/2016 1438   VLDL 56.4 (H) 01/10/2016 1438   LDLCALC 127 (H) 12/23/2014 1033    CBC    Component Value Date/Time   WBC 14.7 (H) 01/24/2016 1406   RBC 4.63 01/24/2016 1406   HGB 13.2 01/24/2016 1406   HCT 39.0 01/24/2016 1406   PLT 236.0 01/24/2016 1406   MCV 84.1 01/24/2016 1406   MCH 29.0 08/31/2015 1104   MCHC 33.9 01/24/2016 1406   RDW 14.1 01/24/2016 1406   LYMPHSABS 1.8 08/31/2015 1104   MONOABS 0.6 08/31/2015 1104   EOSABS 0.1 08/31/2015 1104   BASOSABS 0.0 08/31/2015 1104    Hgb A1C Lab Results  Component Value Date   HGBA1C 5.6 01/10/2016           Assessment & Plan:   Right flank pain, suprapubic pain:  Could still be kidney stone vs infection Urinalysis: 1+ blood Will send urine culture Push fluids Continue Flomax for now Stop Pen VK Start Cipro 500 mg BID x 7 days, eRx sent to pharmacy Return precautions discussed  RTC as needed or if symptoms persist or worsen Tinnie Kunin, NP

## 2016-12-20 NOTE — Patient Instructions (Signed)
Flank Pain Flank pain refers to pain that is located on the side of the body between the upper abdomen and the back. The pain may occur over a short period of time (acute) or may be long-term or reoccurring (chronic). It may be mild or severe. Flank pain can be caused by many things. CAUSES  Some of the more common causes of flank pain include:  Muscle strains.   Muscle spasms.   A disease of your spine (vertebral disk disease).   A lung infection (pneumonia).   Fluid around your lungs (pulmonary edema).   A kidney infection.   Kidney stones.   A very painful skin rash caused by the chickenpox virus (shingles).   Gallbladder disease.  HOME CARE INSTRUCTIONS  Home care will depend on the cause of your pain. In general,  Rest as directed by your caregiver.  Drink enough fluids to keep your urine clear or pale yellow.  Only take over-the-counter or prescription medicines as directed by your caregiver. Some medicines may help relieve the pain.  Tell your caregiver about any changes in your pain.  Follow up with your caregiver as directed. SEEK IMMEDIATE MEDICAL CARE IF:   Your pain is not controlled with medicine.   You have new or worsening symptoms.  Your pain increases.   You have abdominal pain.   You have shortness of breath.   You have persistent nausea or vomiting.   You have swelling in your abdomen.   You feel faint or pass out.   You have blood in your urine.  You have a fever or persistent symptoms for more than 2-3 days.  You have a fever and your symptoms suddenly get worse. MAKE SURE YOU:   Understand these instructions.  Will watch your condition.  Will get help right away if you are not doing well or get worse. This information is not intended to replace advice given to you by your health care provider. Make sure you discuss any questions you have with your health care provider. Document Released: 01/31/2006 Document  Revised: 09/03/2012 Document Reviewed: 09/13/2015 Elsevier Interactive Patient Education  2017 Elsevier Inc.  

## 2016-12-21 LAB — URINE CULTURE: Organism ID, Bacteria: NO GROWTH

## 2017-01-21 DIAGNOSIS — S93504A Unspecified sprain of right lesser toe(s), initial encounter: Secondary | ICD-10-CM | POA: Diagnosis not present

## 2017-01-23 ENCOUNTER — Ambulatory Visit (INDEPENDENT_AMBULATORY_CARE_PROVIDER_SITE_OTHER): Payer: BLUE CROSS/BLUE SHIELD | Admitting: Obstetrics and Gynecology

## 2017-01-23 ENCOUNTER — Encounter: Payer: Self-pay | Admitting: Obstetrics and Gynecology

## 2017-01-23 VITALS — BP 120/76 | HR 60 | Resp 18 | Ht 65.5 in | Wt 233.8 lb

## 2017-01-23 DIAGNOSIS — Z01419 Encounter for gynecological examination (general) (routine) without abnormal findings: Secondary | ICD-10-CM | POA: Diagnosis not present

## 2017-01-23 DIAGNOSIS — Z0000 Encounter for general adult medical examination without abnormal findings: Secondary | ICD-10-CM

## 2017-01-23 DIAGNOSIS — N898 Other specified noninflammatory disorders of vagina: Secondary | ICD-10-CM

## 2017-01-23 DIAGNOSIS — R8290 Unspecified abnormal findings in urine: Secondary | ICD-10-CM

## 2017-01-23 LAB — POCT URINALYSIS DIPSTICK
Bilirubin, UA: NEGATIVE
Blood, UA: NEGATIVE
Glucose, UA: NEGATIVE
Ketones, UA: NEGATIVE
Leukocytes, UA: NEGATIVE
Nitrite, UA: NEGATIVE
Protein, UA: NEGATIVE
Urobilinogen, UA: NEGATIVE
pH, UA: 5

## 2017-01-23 LAB — HM PAP SMEAR: HM Pap smear: NORMAL

## 2017-01-23 NOTE — Patient Instructions (Signed)

## 2017-01-23 NOTE — Progress Notes (Signed)
51 y.o. G58P2012 Married Caucasian female here for annual exam.    Pyelonephritis in Jan. 2017.  CT scan suggested small left renal stone and had hepatic steatosis.   E Coli UTI one year ago.   Saw Dr. Matilde Sprang 6 months ago due to UTIs and question about potential repeat surgery for prolapse. Was placed on a prolonged course of abx.  Discussed potential surgery.  Voids with some hesitation to stream. Feels prolapse has returned.  Not sure if she wants to have surgery again.   Notes urine odor. Not sure if it is the vagina.  Had left breast pain which has resolved.   Going to the [facility] and trying to loose weight.  Rembrandt daughter is 91 months old.   PCP: Webb Silversmith, NP    Patient's last menstrual period was 12/24/2006 (approximate).           Sexually active: Yes.   female The current method of family planning is status post Tubal/hysterectomy.    Exercising: Yes.    Walking, light weights Smoker:  no  Health Maintenance: Pap: 06/2013 negative per patient  History of abnormal Pap: Yes, 1993 Hx cryotherapy to cervix. Paps were normal since. MMG:  04-23-16 Density A/Neg/BiRads1:TBC. Colonoscopy: 05-30-07 adenomatous polyps with Dr. Oretha Caprice at [facility]. Next colonoscopy was due 05/2012.   BMD:   n/a  Result  n/a TDaP:  01-06-14 Gardasil:   N/A   Screening Labs:  Hb today: PCP, Urine today: Neg   reports that she quit smoking about 8 years ago. Her smoking use included Cigarettes. She quit after 15.00 years of use. She has never used smokeless tobacco. She reports that she does not drink alcohol or use drugs.  Past Medical History:  Diagnosis Date  . Anxiety   . Depression   . Frequency of urination   . GERD (gastroesophageal reflux disease)   . History of abnormal cervical Pap smear    1991 -- 1995--hx cryotherapy to cervix  . History of colon polyps    2008- BENIGN  . History of nephrolithiasis   . Hydronephrosis, left   . Kidney stones 2016  . Left ureteral stone    . Microhematuria   . PONV (postoperative nausea and vomiting)   . Urgency of urination   . Wears glasses     Past Surgical History:  Procedure Laterality Date  . ABDOMINAL HYSTERECTOMY    . ANTERIOR AND POSTERIOR REPAIR WITH SACROSPINOUS FIXATION N/A 08/16/2015   Procedure: ANTERIOR COLPORRHAPHY WITH XENOFORM GRAFT AND SACROSPINOUS FIXATION;  Surgeon: Nunzio Cobbs, MD;  Location: [facility];  Service: Gynecology;  Laterality: N/A;  2.5 hours OR time  . BLADDER SUSPENSION N/A 08/16/2015   Procedure: TRANSVAGINAL TAPE (TVT) PROCEDURE exact midurethral sling;  Surgeon: Nunzio Cobbs, MD;  Location: [facility];  Service: Gynecology;  Laterality: N/A;  . COLONOSCOPY W/ POLYPECTOMY  05-30-2007  . CYSTOSCOPY N/A 08/16/2015   Procedure: CYSTOSCOPY;  Surgeon: Nunzio Cobbs, MD;  Location: [facility];  Service: Gynecology;  Laterality: N/A;  . CYSTOSCOPY W/ RETROGRADES Bilateral 06/22/2015   Procedure: CYSTOSCOPY WITH RETROGRADE PYELOGRAM;  Surgeon: Cleon Gustin, MD;  Location: [facility];  Service: Urology;  Laterality: Bilateral;  . CYSTOSCOPY W/ URETERAL STENT PLACEMENT  02/  2000  . CYSTOSCOPY WITH HOLMIUM LASER LITHOTRIPSY Left 05/18/2015   Procedure: CYSTOSCOPY WITH HOLMIUM LASER LITHOTRIPSY;  Surgeon: Alexis Frock, MD;  Location: [facility];  Service: Urology;  Laterality: Left;  . CYSTOSCOPY WITH RETROGRADE PYELOGRAM, URETEROSCOPY AND STENT PLACEMENT Left 06/22/2015   Procedure: CYSTOSCOPY,  LEFT URETEROSCOPY;  Surgeon: Cleon Gustin, MD;  Location: [facility];  Service: Urology;  Laterality: Left;  . CYSTOSCOPY WITH URETEROSCOPY AND STENT PLACEMENT Left 05/18/2015   Procedure: CYSTOSCOPY WITH URETEROSCOPY, STONE MANIPULATION AND STENT PLACEMENT;  Surgeon: Alexis Frock, MD;  Location[facility];  Service: Urology;  Laterality: Left;  . DIAGNOSTIC LAPAROSCOPY    . DX LAPAROSCOPY  X2  .  ESOPHAGOGASTRODUODENOSCOPY  05-09-2007  . EXPLORATORY LAPAROTOMY W/ BILATERAL SALPINGECTOMY AND REMOVAL RIGHT ECTOPIC PREG.  02-23-2004  . EXTRACORPOREAL SHOCK WAVE LITHOTRIPSY  2001  &  2002  . LAPAROSCOPIC CHOLECYSTECTOMY  1999  . SHOULDER ARTHROSCOPY WITH OPEN ROTATOR CUFF REPAIR Right 2012  . TOTAL ABDOMINAL HYSTERECTOMY W/ BILATERAL OOPHORECTOMY AND LYSIS ADHESIONS  09-02-2007  . TUBAL LIGATION Bilateral 1995  . UMBILICAL HERNIA REPAIR  2003  . VAGINAL HYSTERECTOMY N/A 08/18/2015   Procedure: Exam under Anesthesia, Excision Xenform Graft, Removal Bilateral Sacrospinous Ligament sutures;  Surgeon: Nunzio Cobbs, MD;  Location: [facility];  Service: Gynecology;  Laterality: N/A;    Current Outpatient Prescriptions  Medication Sig Dispense Refill  . ALPRAZolam (XANAX) 0.5 MG tablet Take 1 tablet (0.5 mg total) by mouth daily as needed for anxiety. 20 tablet 0  . buPROPion (WELLBUTRIN XL) 150 MG 24 hr tablet Take 1 tablet (150 mg total) by mouth daily. 30 tablet 5  . cholecalciferol (VITAMIN D) 1000 units tablet Take 1,000 Units by mouth daily.    . Multiple Vitamin (MULTIVITAMIN WITH MINERALS) TABS tablet Take 1 tablet by mouth daily.    . Omega-3 Fatty Acids (FISH OIL PO) Take 1 capsule by mouth daily.     No current facility-administered medications for this visit.     Family History  Problem Relation Age of Onset  . Hypertension Mother   . Diabetes Father   . Cancer Paternal Uncle     Pancreas  . Heart attack Maternal Grandmother   . Drug abuse Sister   . Colon cancer Neg Hx   . Esophageal cancer Neg Hx   . Stomach cancer Neg Hx   . Rectal cancer Neg Hx     ROS:  Pertinent items are noted in HPI.  Otherwise, a comprehensive ROS was negative.  Exam:   BP 120/76 (BP Location: Right Arm, Patient Position: Sitting, Cuff Size: Large)   Pulse 60   Resp 18   Ht 5' 5.5" (1.664 m)   Wt 233 lb 12.8 oz (106.1 kg)   LMP 12/24/2006 (Approximate)   BMI 38.31 kg/m      General appearance: alert, cooperative and appears stated age Head: Normocephalic, without obvious abnormality, atraumatic Neck: no adenopathy, supple, symmetrical, trachea midline and thyroid normal to inspection and palpation Lungs: clear to auscultation bilaterally Breasts: normal appearance, no masses or tenderness, No nipple retraction or dimpling, No nipple discharge or bleeding, No axillary or supraclavicular adenopathy Heart: regular rate and rhythm Abdomen: soft, non-tender; no masses, no organomegaly Extremities: extremities normal, atraumatic, no cyanosis or edema Skin: Skin color, texture, turgor normal. No rashes or lesions Lymph nodes: Cervical, supraclavicular, and axillary nodes normal. No abnormal inguinal nodes palpated Neurologic: Grossly normal  Pelvic: External genitalia:  no lesions              Urethra:  normal appearing urethra with no masses, tenderness or lesions  Bartholins and Skenes: normal                 Vagina: normal appearing vagina with normal color and discharge, no lesions. Second degree cystocele.  Vault feels fairly well supported.  Good posterior vaginal support.              Cervix:  Absent.              Pap taken: No. Bimanual Exam:  Uterus:   Absent.              Adnexa: no mass, fullness, tenderness              Rectal exam: Yes.  .  Confirms.              Anus:  normal sphincter tone, no lesions  Chaperone was present for exam.  Assessment:   Well woman visit. Status post TAH/BSO/LOA. Status post anterior colporrhaphy with midurethral sling.  Status post excision of bilateral sacrospinous fixation sutures and Xenform graft on anterior vaginal wall. Sciatic nerve compression from sacrospinous fixation.  Resolved. Vaginal odor.  Urine odor.  Recurrent cystocele.  Recurrent UTIs.  Voiding dysfunction.   Plan: Mammogram screening discussed. Recommended self breast awareness. Pap and HR HPV as above. Guidelines for  Calcium, Vitamin D, regular exercise program including cardiovascular and weight bearing exercise. Urine micro and cx, Affirm.  Patient will return to see Dr. Matilde Sprang for a recheck appointment and potential urodynamics. We discussed surgical care to repair the cystocele - anterior colporrhaphy with sacrospinous fixation using a biological graft versus doing a straightforward cystocele repair.  We discussed risk of recurrence of prolapse, ureteral compromise, recurrent sciatica, and potential shortening and tightening of the vagina.  We discussed the benefit of reducing recurrent UTIs and having more normal voiding.  Patient appreciative of the conversation.  Follow up annually and prn.       After visit summary provided.

## 2017-01-24 LAB — URINALYSIS, MICROSCOPIC ONLY
Bacteria, UA: NONE SEEN [HPF]
Casts: NONE SEEN [LPF]
Crystals: NONE SEEN [HPF]
RBC / HPF: NONE SEEN RBC/HPF (ref <=2)
WBC, UA: NONE SEEN WBC/HPF (ref <=5)
Yeast: NONE SEEN [HPF]

## 2017-01-24 LAB — WET PREP BY MOLECULAR PROBE
Candida species: NEGATIVE
Gardnerella vaginalis: NEGATIVE
Trichomonas vaginosis: NEGATIVE

## 2017-01-24 LAB — URINE CULTURE

## 2017-02-01 ENCOUNTER — Encounter: Payer: Self-pay | Admitting: Family Medicine

## 2017-02-01 ENCOUNTER — Ambulatory Visit (INDEPENDENT_AMBULATORY_CARE_PROVIDER_SITE_OTHER): Payer: BLUE CROSS/BLUE SHIELD | Admitting: Family Medicine

## 2017-02-01 ENCOUNTER — Ambulatory Visit (INDEPENDENT_AMBULATORY_CARE_PROVIDER_SITE_OTHER): Payer: BLUE CROSS/BLUE SHIELD

## 2017-02-01 VITALS — BP 136/90 | HR 74 | Temp 97.5°F | Wt 232.8 lb

## 2017-02-01 DIAGNOSIS — S99921A Unspecified injury of right foot, initial encounter: Secondary | ICD-10-CM

## 2017-02-01 DIAGNOSIS — M79671 Pain in right foot: Secondary | ICD-10-CM

## 2017-02-01 DIAGNOSIS — M7989 Other specified soft tissue disorders: Secondary | ICD-10-CM | POA: Diagnosis not present

## 2017-02-01 DIAGNOSIS — M25474 Effusion, right foot: Secondary | ICD-10-CM

## 2017-02-01 DIAGNOSIS — Z23 Encounter for immunization: Secondary | ICD-10-CM | POA: Diagnosis not present

## 2017-02-01 NOTE — Progress Notes (Signed)
Subjective:    Patient ID: [name], female    DOB: 23-Jun-1966, 51 y.o.   MRN: [id number]  HPI This is a 51 yo female who presents today with right toe/foot  pain x 2 weeks. She had stubbed her right pinky toe on her husband's foot. She went to [facility] two days later and was told her xray was normal. Several days after noticed foot black and blue. Continues to have pain along lateral side of foot. Some improvement with ice, taking ibuprofen 800 mg po bid with some relief. Has been trying to continue exercise. Pain with wearing shoes, especially tennis shoes. Pain with walking and moving 5th toe laterally.   Past Medical History:  Diagnosis Date  . Anxiety   . Depression   . Frequency of urination   . GERD (gastroesophageal reflux disease)   . History of abnormal cervical Pap smear    1991 -- 1995--hx cryotherapy to cervix  . History of colon polyps    2008- BENIGN  . History of nephrolithiasis   . Hydronephrosis, left   . Kidney stones 2016  . Left ureteral stone   . Microhematuria   . PONV (postoperative nausea and vomiting)   . Urgency of urination   . Wears glasses    Past Surgical History:  Procedure Laterality Date  . ABDOMINAL HYSTERECTOMY    . ANTERIOR AND POSTERIOR REPAIR WITH SACROSPINOUS FIXATION N/A 08/16/2015   Procedure: ANTERIOR COLPORRHAPHY WITH XENOFORM GRAFT AND SACROSPINOUS FIXATION;  Surgeon: Nunzio Cobbs, MD;  Location: [facility];  Service: Gynecology;  Laterality: N/A;  2.5 hours OR time  . BLADDER SUSPENSION N/A 08/16/2015   Procedure: TRANSVAGINAL TAPE (TVT) PROCEDURE exact midurethral sling;  Surgeon: Nunzio Cobbs, MD;  Location: [facility];  Service: Gynecology;  Laterality: N/A;  . COLONOSCOPY W/ POLYPECTOMY  05-30-2007  . CYSTOSCOPY N/A 08/16/2015   Procedure: CYSTOSCOPY;  Surgeon: Nunzio Cobbs, MD;  Location: [facility];  Service: Gynecology;  Laterality: N/A;  . CYSTOSCOPY W/ RETROGRADES Bilateral 06/22/2015   Procedure:  CYSTOSCOPY WITH RETROGRADE PYELOGRAM;  Surgeon: Cleon Gustin, MD;  Location[facility];  Service: Urology;  Laterality: Bilateral;  . CYSTOSCOPY W/ URETERAL STENT PLACEMENT  02/  2000  . CYSTOSCOPY WITH HOLMIUM LASER LITHOTRIPSY Left 05/18/2015   Procedure: CYSTOSCOPY WITH HOLMIUM LASER LITHOTRIPSY;  Surgeon: Alexis Frock, MD;  Location: [facility];  Service: Urology;  Laterality: Left;  . CYSTOSCOPY WITH RETROGRADE PYELOGRAM, URETEROSCOPY AND STENT PLACEMENT Left 06/22/2015   Procedure: CYSTOSCOPY,  LEFT URETEROSCOPY;  Surgeon: Cleon Gustin, MD;  [facility];  Service: Urology;  Laterality: Left;  . CYSTOSCOPY WITH URETEROSCOPY AND STENT PLACEMENT Left 05/18/2015   Procedure: CYSTOSCOPY WITH URETEROSCOPY, STONE MANIPULATION AND STENT PLACEMENT;  Surgeon: Alexis Frock, MD;  Location: [facility];  Service: Urology;  Laterality: Left;  . DIAGNOSTIC LAPAROSCOPY    . DX LAPAROSCOPY  X2  . ESOPHAGOGASTRODUODENOSCOPY  05-09-2007  . EXPLORATORY LAPAROTOMY W/ BILATERAL SALPINGECTOMY AND REMOVAL RIGHT ECTOPIC PREG.  02-23-2004  . EXTRACORPOREAL SHOCK WAVE LITHOTRIPSY  2001  &  2002  . LAPAROSCOPIC CHOLECYSTECTOMY  1999  . SHOULDER ARTHROSCOPY WITH OPEN ROTATOR CUFF REPAIR Right 2012  . TOTAL ABDOMINAL HYSTERECTOMY W/ BILATERAL OOPHORECTOMY AND LYSIS ADHESIONS  09-02-2007  . TUBAL LIGATION Bilateral 1995  . UMBILICAL HERNIA REPAIR  2003  . VAGINAL HYSTERECTOMY N/A 08/18/2015   Procedure: Exam under Anesthesia, Excision Xenform  Graft, Removal Bilateral Sacrospinous Ligament sutures;  Surgeon: Nunzio Cobbs, MD;  Location: [facility];  Service: Gynecology;  Laterality: N/A;   Family History  Problem Relation Age of Onset  . Hypertension Mother   . Diabetes Father   . Cancer Paternal Uncle     Pancreas  . Heart attack Maternal Grandmother   . Drug abuse Sister   . Colon cancer Neg Hx   . Esophageal cancer  Neg Hx   . Stomach cancer Neg Hx   . Rectal cancer Neg Hx    Social History  Substance Use Topics  . Smoking status: Former Smoker    Years: 15.00    Types: Cigarettes    Quit date: 05/16/2008  . Smokeless tobacco: Never Used  . Alcohol use No      Review of Systems Per HPI    Objective:   Physical Exam  Constitutional: She appears well-developed and well-nourished. No distress.  HENT:  Head: Normocephalic and atraumatic.  Eyes: Conjunctivae are normal.  Cardiovascular: Normal rate.   Pulmonary/Chest: Effort normal.  Musculoskeletal:       Right foot: There is tenderness, bony tenderness and swelling (mild).  Slight ecchymosis of 3,4,5th toes. Fifth toe and lateral foot with mild swelling. Pain with palpation over 5th metatarsal.    Skin: She is not diaphoretic.  Vitals reviewed.     BP 136/90 (BP Location: Left Arm, Patient Position: Sitting, Cuff Size: Normal)   Pulse 74   Temp 97.5 F (36.4 C) (Oral)   Wt 232 lb 12.8 oz (105.6 kg)   LMP 12/24/2006 (Approximate)   SpO2 98%   BMI 38.15 kg/m  Wt Readings from Last 3 Encounters:  02/01/17 232 lb 12.8 oz (105.6 kg)  01/23/17 233 lb 12.8 oz (106.1 kg)  12/20/16 233 lb (105.7 kg)       Assessment & Plan:  1. Need for influenza vaccination - Flu Vaccine QUAD 36+ mos PF IM (Fluarix & Fluzone Quad PF)  2. Foot trauma, right, initial encounter - DG Foot Complete Right; Future  3. Right foot pain - DG Foot Complete Right; Future  4. Swelling of foot joint, right - DG Foot Complete Right; Future  - will notify her of xray results, continue ibuprofen 800 mg po BID, ice prn Clarene Reamer, FNP-BC  [facility] [place], [address]  02/01/2017 4:31 PM

## 2017-02-01 NOTE — Progress Notes (Signed)
Pre visit review using our clinic review tool, if applicable. No additional management support is needed unless otherwise documented below in the visit note. 

## 2017-02-01 NOTE — Patient Instructions (Signed)
I will notify you of your xray results

## 2017-02-20 ENCOUNTER — Encounter: Payer: Self-pay | Admitting: Internal Medicine

## 2017-02-20 MED ORDER — BUPROPION HCL ER (XL) 300 MG PO TB24: 300.0000 mg | ORAL_TABLET | Freq: Every day | ORAL | 0 refills | Status: DC

## 2017-02-20 NOTE — Addendum Note (Signed)
Addended by: Jearld Fenton on: 02/20/2017 02:32 PM   Modules accepted: Orders

## 2017-02-20 NOTE — Addendum Note (Signed)
Addended by: Jearld Fenton on: 02/20/2017 12:35 PM   Modules accepted: Orders

## 2017-03-14 ENCOUNTER — Encounter: Payer: Self-pay | Admitting: Internal Medicine

## 2017-03-18 ENCOUNTER — Encounter: Payer: Self-pay | Admitting: Internal Medicine

## 2017-03-18 ENCOUNTER — Ambulatory Visit (INDEPENDENT_AMBULATORY_CARE_PROVIDER_SITE_OTHER): Payer: BLUE CROSS/BLUE SHIELD | Admitting: Internal Medicine

## 2017-03-18 VITALS — BP 122/78 | HR 76 | Temp 97.7°F | Ht 65.5 in | Wt 231.2 lb

## 2017-03-18 DIAGNOSIS — R0981 Nasal congestion: Secondary | ICD-10-CM | POA: Diagnosis not present

## 2017-03-18 DIAGNOSIS — K219 Gastro-esophageal reflux disease without esophagitis: Secondary | ICD-10-CM | POA: Diagnosis not present

## 2017-03-18 DIAGNOSIS — Z0001 Encounter for general adult medical examination with abnormal findings: Secondary | ICD-10-CM | POA: Diagnosis not present

## 2017-03-18 DIAGNOSIS — R519 Headache, unspecified: Secondary | ICD-10-CM

## 2017-03-18 DIAGNOSIS — R51 Headache: Secondary | ICD-10-CM | POA: Diagnosis not present

## 2017-03-18 LAB — LIPID PANEL
Cholesterol: 175 mg/dL (ref 0–200)
HDL: 45.6 mg/dL (ref >39.00)
LDL Cholesterol: 108 mg/dL — ABNORMAL HIGH (ref 0–99)
NonHDL: 129.87
Total CHOL/HDL Ratio: 4
Triglycerides: 107 mg/dL (ref 0.0–149.0)
VLDL: 21.4 mg/dL (ref 0.0–40.0)

## 2017-03-18 LAB — CBC
HCT: 40.9 % (ref 36.0–46.0)
Hemoglobin: 13.7 g/dL (ref 12.0–15.0)
MCHC: 33.5 g/dL (ref 30.0–36.0)
MCV: 84.6 fl (ref 78.0–100.0)
Platelets: 249 10*3/uL (ref 150.0–400.0)
RBC: 4.84 Mil/uL (ref 3.87–5.11)
RDW: 14.2 % (ref 11.5–15.5)
WBC: 5.8 10*3/uL (ref 4.0–10.5)

## 2017-03-18 LAB — COMPREHENSIVE METABOLIC PANEL
ALT: 16 U/L (ref 0–35)
AST: 15 U/L (ref 0–37)
Albumin: 4.1 g/dL (ref 3.5–5.2)
Alkaline Phosphatase: 79 U/L (ref 39–117)
BUN: 11 mg/dL (ref 6–23)
CO2: 28 mEq/L (ref 19–32)
Calcium: 9.3 mg/dL (ref 8.4–10.5)
Chloride: 107 mEq/L (ref 96–112)
Creatinine, Ser: 0.81 mg/dL (ref 0.40–1.20)
GFR: 79.12 mL/min (ref >60.00)
Glucose, Bld: 107 mg/dL — ABNORMAL HIGH (ref 70–99)
Potassium: 4.2 mEq/L (ref 3.5–5.1)
Sodium: 140 mEq/L (ref 135–145)
Total Bilirubin: 0.3 mg/dL (ref 0.2–1.2)
Total Protein: 7.4 g/dL (ref 6.0–8.3)

## 2017-03-18 LAB — HEMOGLOBIN A1C: Hgb A1c MFr Bld: 5.8 % (ref 4.6–6.5)

## 2017-03-18 LAB — VITAMIN D 25 HYDROXY (VIT D DEFICIENCY, FRACTURES): VITD: 30.87 ng/mL (ref 30.00–100.00)

## 2017-03-18 NOTE — Patient Instructions (Signed)
Health Maintenance for Postmenopausal Women Menopause is a normal process in which your reproductive ability comes to an end. This process happens gradually over a span of months to years, usually between the ages of 33 and 38. Menopause is complete when you have missed 12 consecutive menstrual periods. It is important to talk with your health care provider about some of the most common conditions that affect postmenopausal women, such as heart disease, cancer, and bone loss (osteoporosis). Adopting a healthy lifestyle and getting preventive care can help to promote your health and wellness. Those actions can also lower your chances of developing some of these common conditions. What should I know about menopause? During menopause, you may experience a number of symptoms, such as:  Moderate-to-severe hot flashes.  Night sweats.  Decrease in sex drive.  Mood swings.  Headaches.  Tiredness.  Irritability.  Memory problems.  Insomnia. Choosing to treat or not to treat menopausal changes is an individual decision that you make with your health care provider. What should I know about hormone replacement therapy and supplements? Hormone therapy products are effective for treating symptoms that are associated with menopause, such as hot flashes and night sweats. Hormone replacement carries certain risks, especially as you become older. If you are thinking about using estrogen or estrogen with progestin treatments, discuss the benefits and risks with your health care provider. What should I know about heart disease and stroke? Heart disease, heart attack, and stroke become more likely as you age. This may be due, in part, to the hormonal changes that your body experiences during menopause. These can affect how your body processes dietary fats, triglycerides, and cholesterol. Heart attack and stroke are both medical emergencies. There are many things that you can do to help prevent heart disease  and stroke:  Have your blood pressure checked at least every 1-2 years. High blood pressure causes heart disease and increases the risk of stroke.  If you are 48-61 years old, ask your health care provider if you should take aspirin to prevent a heart attack or a stroke.  Do not use any tobacco products, including cigarettes, chewing tobacco, or electronic cigarettes. If you need help quitting, ask your health care provider.  It is important to eat a healthy diet and maintain a healthy weight.  Be sure to include plenty of vegetables, fruits, low-fat dairy products, and lean protein.  Avoid eating foods that are high in solid fats, added sugars, or salt (sodium).  Get regular exercise. This is one of the most important things that you can do for your health.  Try to exercise for at least 150 minutes each week. The type of exercise that you do should increase your heart rate and make you sweat. This is known as moderate-intensity exercise.  Try to do strengthening exercises at least twice each week. Do these in addition to the moderate-intensity exercise.  Know your numbers.Ask your health care provider to check your cholesterol and your blood glucose. Continue to have your blood tested as directed by your health care provider. What should I know about cancer screening? There are several types of cancer. Take the following steps to reduce your risk and to catch any cancer development as early as possible. Breast Cancer  Practice breast self-awareness.  This means understanding how your breasts normally appear and feel.  It also means doing regular breast self-exams. Let your health care provider know about any changes, no matter how small.  If you are 40 or older,  have a clinician do a breast exam (clinical breast exam or CBE) every year. Depending on your age, family history, and medical history, it may be recommended that you also have a yearly breast X-ray (mammogram).  If you  have a family history of breast cancer, talk with your health care provider about genetic screening.  If you are at high risk for breast cancer, talk with your health care provider about having an MRI and a mammogram every year.  Breast cancer (BRCA) gene test is recommended for women who have family members with BRCA-related cancers. Results of the assessment will determine the need for genetic counseling and BRCA1 and for BRCA2 testing. BRCA-related cancers include these types:  Breast. This occurs in males or females.  Ovarian.  Tubal. This may also be called fallopian tube cancer.  Cancer of the abdominal or pelvic lining (peritoneal cancer).  Prostate.  Pancreatic. Cervical, Uterine, and Ovarian Cancer  Your health care provider may recommend that you be screened regularly for cancer of the pelvic organs. These include your ovaries, uterus, and vagina. This screening involves a pelvic exam, which includes checking for microscopic changes to the surface of your cervix (Pap test).  For women ages 21-65, health care providers may recommend a pelvic exam and a Pap test every three years. For women ages 23-65, they may recommend the Pap test and pelvic exam, combined with testing for human papilloma virus (HPV), every five years. Some types of HPV increase your risk of cervical cancer. Testing for HPV may also be done on women of any age who have unclear Pap test results.  Other health care providers may not recommend any screening for nonpregnant women who are considered low risk for pelvic cancer and have no symptoms. Ask your health care provider if a screening pelvic exam is right for you.  If you have had past treatment for cervical cancer or a condition that could lead to cancer, you need Pap tests and screening for cancer for at least 20 years after your treatment. If Pap tests have been discontinued for you, your risk factors (such as having a new sexual partner) need to be reassessed  to determine if you should start having screenings again. Some women have medical problems that increase the chance of getting cervical cancer. In these cases, your health care provider may recommend that you have screening and Pap tests more often.  If you have a family history of uterine cancer or ovarian cancer, talk with your health care provider about genetic screening.  If you have vaginal bleeding after reaching menopause, tell your health care provider.  There are currently no reliable tests available to screen for ovarian cancer. Lung Cancer  Lung cancer screening is recommended for adults 99-83 years old who are at high risk for lung cancer because of a history of smoking. A yearly low-dose CT scan of the lungs is recommended if you:  Currently smoke.  Have a history of at least 30 pack-years of smoking and you currently smoke or have quit within the past 15 years. A pack-year is smoking an average of one pack of cigarettes per day for one year. Yearly screening should:  Continue until it has been 15 years since you quit.  Stop if you develop a health problem that would prevent you from having lung cancer treatment. Colorectal Cancer  This type of cancer can be detected and can often be prevented.  Routine colorectal cancer screening usually begins at age 72 and continues  through age 75.  If you have risk factors for colon cancer, your health care provider may recommend that you be screened at an earlier age.  If you have a family history of colorectal cancer, talk with your health care provider about genetic screening.  Your health care provider may also recommend using home test kits to check for hidden blood in your stool.  A small camera at the end of a tube can be used to examine your colon directly (sigmoidoscopy or colonoscopy). This is done to check for the earliest forms of colorectal cancer.  Direct examination of the colon should be repeated every 5-10 years until  age 75. However, if early forms of precancerous polyps or small growths are found or if you have a family history or genetic risk for colorectal cancer, you may need to be screened more often. Skin Cancer  Check your skin from head to toe regularly.  Monitor any moles. Be sure to tell your health care provider:  About any new moles or changes in moles, especially if there is a change in a mole's shape or color.  If you have a mole that is larger than the size of a pencil eraser.  If any of your family members has a history of skin cancer, especially at a young age, talk with your health care provider about genetic screening.  Always use sunscreen. Apply sunscreen liberally and repeatedly throughout the day.  Whenever you are outside, protect yourself by wearing long sleeves, pants, a wide-brimmed hat, and sunglasses. What should I know about osteoporosis? Osteoporosis is a condition in which bone destruction happens more quickly than new bone creation. After menopause, you may be at an increased risk for osteoporosis. To help prevent osteoporosis or the bone fractures that can happen because of osteoporosis, the following is recommended:  If you are 19-50 years old, get at least 1,000 mg of calcium and at least 600 mg of vitamin D per day.  If you are older than age 50 but younger than age 70, get at least 1,200 mg of calcium and at least 600 mg of vitamin D per day.  If you are older than age 70, get at least 1,200 mg of calcium and at least 800 mg of vitamin D per day. Smoking and excessive alcohol intake increase the risk of osteoporosis. Eat foods that are rich in calcium and vitamin D, and do weight-bearing exercises several times each week as directed by your health care provider. What should I know about how menopause affects my mental health? Depression may occur at any age, but it is more common as you become older. Common symptoms of depression include:  Low or sad  mood.  Changes in sleep patterns.  Changes in appetite or eating patterns.  Feeling an overall lack of motivation or enjoyment of activities that you previously enjoyed.  Frequent crying spells. Talk with your health care provider if you think that you are experiencing depression. What should I know about immunizations? It is important that you get and maintain your immunizations. These include:  Tetanus, diphtheria, and pertussis (Tdap) booster vaccine.  Influenza every year before the flu season begins.  Pneumonia vaccine.  Shingles vaccine. Your health care provider may also recommend other immunizations. This information is not intended to replace advice given to you by your health care provider. Make sure you discuss any questions you have with your health care provider. Document Released: 02/01/2006 Document Revised: 06/29/2016 Document Reviewed: 09/13/2015 Elsevier Interactive Patient   Education  2017 [employer]  

## 2017-03-18 NOTE — Progress Notes (Signed)
Subjective:    Patient ID: [name], female    DOB: Dec 27, 1965, 51 y.o.   MRN: [id number]  HPI  Pt presents to the clinic today for her annual exam.  Flu: 01/2017 Tetanus: 12/2013 Pap Smear: 12/2016 Mammogram: 04/2016 Colon Screening: 05/2007 Vision Screening: yearly Dentist: annually  Diet: She does eat meat. She consumes fruits and veggies daily. She does eat some fried foods. She drinks mostly water and Coke Zero. Exercise: She is doing line dancing, full body workout, weights and walking. 1 hour 4 days a week.  Review of Systems      Past Medical History:  Diagnosis Date  . Anxiety   . Depression   . Frequency of urination   . GERD (gastroesophageal reflux disease)   . History of abnormal cervical Pap smear    1991 -- 1995--hx cryotherapy to cervix  . History of colon polyps    2008- BENIGN  . History of nephrolithiasis   . Hydronephrosis, left   . Kidney stones 2016  . Left ureteral stone   . Microhematuria   . PONV (postoperative nausea and vomiting)   . Urgency of urination   . Wears glasses     Current Outpatient Prescriptions  Medication Sig Dispense Refill  . ALPRAZolam (XANAX) 0.5 MG tablet Take 1 tablet (0.5 mg total) by mouth daily as needed for anxiety. 20 tablet 0  . buPROPion (WELLBUTRIN XL) 300 MG 24 hr tablet Take 1 tablet (300 mg total) by mouth daily. 90 tablet 0  . cholecalciferol (VITAMIN D) 1000 units tablet Take 1,000 Units by mouth daily.    . Multiple Vitamin (MULTIVITAMIN WITH MINERALS) TABS tablet Take 1 tablet by mouth daily.    . Omega-3 Fatty Acids (FISH OIL PO) Take 1 capsule by mouth daily.     No current facility-administered medications for this visit.     Allergies  Allergen Reactions  . Desvenlafaxine Other (See Comments)    REACTION: withdrawl  . Iohexol      Code: RASH, Desc: PT STATES BACK IN THE 70S SHE HAD IV DYE REACTION.04/30/07/RM---pt given omnipaque 300 w/out pre meds; no complications 07/01/61 slg, Onset Date:  [id number]     Family History  Problem Relation Age of Onset  . Hypertension Mother   . Diabetes Father   . Cancer Paternal Uncle     Pancreas  . Heart attack Maternal Grandmother   . Drug abuse Sister   . Colon cancer Neg Hx   . Esophageal cancer Neg Hx   . Stomach cancer Neg Hx   . Rectal cancer Neg Hx     Social History   Social History  . Marital status: Married    Spouse name: N/A  . Number of children: N/A  . Years of education: N/A   Occupational History  . Not on file.   Social History Main Topics  . Smoking status: Former Smoker    Years: 15.00    Types: Cigarettes    Quit date: 05/16/2008  . Smokeless tobacco: Never Used  . Alcohol use No  . Drug use: No  . Sexual activity: Yes    Partners: Male    Birth control/ protection: Surgical     Comment: Hyst   Other Topics Concern  . Not on file   Social History Narrative  . No narrative on file     Constitutional: Pt reports headache. Denies fever, malaise, fatigue, or abrupt weight changes.  HEENT: Pt reports nasal congestion. Denies eye pain,  eye redness, ear pain, ringing in the ears, wax buildup, runny nose, bloody nose, or sore throat. Respiratory: Denies difficulty breathing, shortness of breath, cough or sputum production.   Cardiovascular: Denies chest pain, chest tightness, palpitations or swelling in the hands or feet.  Gastrointestinal: Pt reports intermittent reflux. Denies abdominal pain, bloating, constipation, diarrhea or blood in the stool.  GU: Denies urgency, frequency, pain with urination, burning sensation, blood in urine, odor or discharge. Musculoskeletal: Denies decrease in range of motion, difficulty with gait, muscle pain or joint pain and swelling.  Skin: Denies redness, rashes, lesions or ulcercations.  Neurological: Denies dizziness, difficulty with memory, difficulty with speech or problems with balance and coordination.  Psych: Pt has history of anxiety and depression. Denies  SI/HI.  No other specific complaints in a complete review of systems (except as listed in HPI above).  Objective:   Physical Exam  BP 122/78   Pulse 76   Temp 97.7 F (36.5 C) (Oral)   Ht 5' 5.5" (1.664 m)   Wt 231 lb 4 oz (104.9 kg)   LMP 12/24/2006 (Approximate)   BMI 37.90 kg/m  Wt Readings from Last 3 Encounters:  03/18/17 231 lb 4 oz (104.9 kg)  02/01/17 232 lb 12.8 oz (105.6 kg)  01/23/17 233 lb 12.8 oz (106.1 kg)    General: Appears her stated age, obese in NAD. Skin: Warm, dry and intact.  HEENT: Head: normal shape and size, frontal sinus pressure noted; Eyes: sclera white, no icterus, conjunctiva pink, PERRLA and EOMs intact; Ears: Tm's gray and intact, normal light reflex; Throat/Mouth: Teeth present, mucosa pink and moist, no exudate, lesions or ulcerations noted.  Neck:  Neck supple, trachea midline. No masses, lumps or thyromegaly present.  Cardiovascular: Normal rate and rhythm. S1,S2 noted.  No murmur, rubs or gallops noted. No JVD or BLE edema. No carotid bruits noted. Pulmonary/Chest: Normal effort and positive vesicular breath sounds. No respiratory distress. No wheezes, rales or ronchi noted.  Abdomen: Soft and nontender. Normal bowel sounds. No distention or masses noted. Liver, spleen and kidneys non palpable. Musculoskeletal: Strength 5/5 BUE/BLE. No difficulty with gait.  Neurological: Alert and oriented. Cranial nerves II-XII grossly intact. Coordination normal.  Psychiatric: Mood and affect normal. Behavior is normal. Judgment and thought content normal.    BMET    Component Value Date/Time   NA 137 01/24/2016 1406   K 4.3 01/24/2016 1406   CL 103 01/24/2016 1406   CO2 27 01/24/2016 1406   GLUCOSE 127 (H) 01/24/2016 1406   BUN 10 01/24/2016 1406   CREATININE 0.96 01/24/2016 1406   CALCIUM 9.4 01/24/2016 1406   GFRNONAA >60 08/19/2015 0515   GFRAA >60 08/19/2015 0515    Lipid Panel     Component Value Date/Time   CHOL 178 01/10/2016 1438    TRIG 282.0 (H) 01/10/2016 1438   HDL 45.90 01/10/2016 1438   CHOLHDL 4 01/10/2016 1438   VLDL 56.4 (H) 01/10/2016 1438   LDLCALC 127 (H) 12/23/2014 1033    CBC    Component Value Date/Time   WBC 14.7 (H) 01/24/2016 1406   RBC 4.63 01/24/2016 1406   HGB 13.2 01/24/2016 1406   HCT 39.0 01/24/2016 1406   PLT 236.0 01/24/2016 1406   MCV 84.1 01/24/2016 1406   MCH 29.0 08/31/2015 1104   MCHC 33.9 01/24/2016 1406   RDW 14.1 01/24/2016 1406   LYMPHSABS 1.8 08/31/2015 1104   MONOABS 0.6 08/31/2015 1104   EOSABS 0.1 08/31/2015 1104   BASOSABS  0.0 08/31/2015 1104    Hgb A1C Lab Results  Component Value Date   HGBA1C 5.6 01/10/2016            Assessment & Plan:   Preventative Health Maintenance:  Flu and tetanus UTD Pap and mammogram UTD Colonoscopy due this year, she will call to schedule Encouraged her to consume a balanced diet and exercise regimen Advised her to see an eye doctor and dentist annually Will check CBC, CMET, Lipid, A1C and Vit D today  Sinus Headache with Nasal Congestion:  Try Flonase OTC  GERD:  Try Prilosec OTC x 14 days  RTC in 1 year, sooner if needed

## 2017-03-19 ENCOUNTER — Other Ambulatory Visit: Payer: Self-pay | Admitting: Internal Medicine

## 2017-04-02 ENCOUNTER — Telehealth: Payer: Self-pay | Admitting: Internal Medicine

## 2017-04-02 NOTE — Telephone Encounter (Signed)
Noted, agree with advice given 

## 2017-04-02 NOTE — Telephone Encounter (Signed)
°  Patient Name: [name]  DOB: [birth year]    Initial Comment Caller states she was bit by something on Friday night. Now has a bruise on her arm that is swollen and has a knot in it. Bruise has spread a little.    Nurse Assessment  Nurse: Jimmye Norman, RN, [place] Date/Time (Eastern Time): 04/02/2017 11:11:25 AM  Confirm and document reason for call. If symptomatic, describe symptoms. ---Caller states she was bit by something on Friday night. Now has a bruise on her arm that is swollen and has a knot in it. Bruise has spread a little.  Does the patient have any new or worsening symptoms? ---Yes  Will a triage be completed? ---Yes  Related visit to physician within the last 2 weeks? ---No  Does the PT have any chronic conditions? (i.e. diabetes, asthma, etc.) ---No  Is the patient pregnant or possibly pregnant? (Ask all females between the ages of 88-55) ---No  Is this a behavioral health or substance abuse call? ---No     Guidelines    Guideline Title Affirmed Question Affirmed Notes  Insect Bite Painful insect bite    Final Disposition User   [place], RN, Whitney    Disagree/Comply: Comply

## 2017-04-02 NOTE — Telephone Encounter (Signed)
PLEASE NOTE: All timestamps contained within this report are represented as [place] Standard Time. CONFIDENTIALTY NOTICE: This fax transmission is intended only for the addressee. It contains information that is legally privileged, confidential or otherwise protected from use or disclosure. If you are not the intended recipient, you are strictly prohibited from reviewing, disclosing, copying using or disseminating any of this information or taking any action in reliance on or regarding this information. If you have received this fax in error, please notify us immediately by telephone so that we can arrange for its return to [place]. Phone: [phone], Toll-Free: [phone], Fax: [phone] Page: 1 of 1 Call Id: [id number] [place] Patient Name: [name] Gender: Female DOB: 11-Jun-1966 Age: 51 Y 36 M 27 D Return Phone Number: [phone] (Primary) City/State/Zip: [place] [address] Client Maybee Day - Client Client Site [facility] - Day Physician Webb Silversmith - NP Who Is Calling Patient / Member / Family / Caregiver Call Type Triage / Clinical Caller Name Kenlea Woodell Relationship To Patient Self Return Phone Number [phone] (Primary) Chief Complaint Insect Bite Reason for Call Symptomatic / Request for [place] states she was bit by something on Friday night. Now has a bruise on her arm that is swollen and has a knot in it. Bruise has spread a little. Appointment Disposition EMR Appointment Not Necessary Info pasted into Epic Yes Nurse Assessment Nurse: Jimmye Norman, RN, [place] Date/Time (Eastern Time): 04/02/2017 11:11:25 AM Confirm and document reason for call. If symptomatic, describe symptoms. ---Caller states she was bit by something on Friday night. Now has a bruise on her arm that is swollen and  has a knot in it. Bruise has spread a little. Does the PT have any chronic conditions? (i.e. diabetes, asthma, etc.) ---No Is the patient pregnant or possibly pregnant? (Ask all females between the ages of 72-55) ---No Guidelines Guideline Title Affirmed Question Insect Bite Painful insect bite Disp. Time Eilene Ghazi Time) Disposition Final User 04/02/2017 11:18:55 AM Home Care Yes [place], RN, Whitney Care Advice Given Per Guideline CALL BACK IF: * Severe pain persists over 2 hours after pain medicine * Bite looks infected (pus, red streaks, increased tenderness) * You become worse. * Bite has not healed after 14 days

## 2017-04-09 ENCOUNTER — Encounter: Payer: Self-pay | Admitting: Gastroenterology

## 2017-04-24 ENCOUNTER — Emergency Department (HOSPITAL_COMMUNITY): Payer: BLUE CROSS/BLUE SHIELD

## 2017-04-24 ENCOUNTER — Encounter: Payer: Self-pay | Admitting: Gastroenterology

## 2017-04-24 ENCOUNTER — Inpatient Hospital Stay (HOSPITAL_COMMUNITY)
Admission: EM | Admit: 2017-04-24 | Discharge: 2017-04-29 | DRG: 866 | Disposition: A | Discharge status: Home / Self Care | Payer: BLUE CROSS/BLUE SHIELD | Attending: Family Medicine | Admitting: Family Medicine

## 2017-04-24 ENCOUNTER — Other Ambulatory Visit: Payer: Self-pay | Admitting: Obstetrics and Gynecology

## 2017-04-24 ENCOUNTER — Encounter (HOSPITAL_COMMUNITY): Payer: Self-pay

## 2017-04-24 DIAGNOSIS — E876 Hypokalemia: Secondary | ICD-10-CM | POA: Diagnosis not present

## 2017-04-24 DIAGNOSIS — R112 Nausea with vomiting, unspecified: Secondary | ICD-10-CM | POA: Diagnosis not present

## 2017-04-24 DIAGNOSIS — K219 Gastro-esophageal reflux disease without esophagitis: Secondary | ICD-10-CM | POA: Diagnosis not present

## 2017-04-24 DIAGNOSIS — Z87891 Personal history of nicotine dependence: Secondary | ICD-10-CM

## 2017-04-24 DIAGNOSIS — R1013 Epigastric pain: Secondary | ICD-10-CM | POA: Diagnosis not present

## 2017-04-24 DIAGNOSIS — R1010 Upper abdominal pain, unspecified: Secondary | ICD-10-CM | POA: Diagnosis not present

## 2017-04-24 DIAGNOSIS — K449 Diaphragmatic hernia without obstruction or gangrene: Secondary | ICD-10-CM

## 2017-04-24 DIAGNOSIS — B349 Viral infection, unspecified: Secondary | ICD-10-CM | POA: Diagnosis not present

## 2017-04-24 DIAGNOSIS — F329 Major depressive disorder, single episode, unspecified: Secondary | ICD-10-CM | POA: Diagnosis not present

## 2017-04-24 DIAGNOSIS — R935 Abnormal findings on diagnostic imaging of other abdominal regions, including retroperitoneum: Secondary | ICD-10-CM | POA: Diagnosis not present

## 2017-04-24 DIAGNOSIS — R109 Unspecified abdominal pain: Secondary | ICD-10-CM | POA: Diagnosis not present

## 2017-04-24 DIAGNOSIS — R509 Fever, unspecified: Secondary | ICD-10-CM | POA: Diagnosis not present

## 2017-04-24 DIAGNOSIS — R1110 Vomiting, unspecified: Secondary | ICD-10-CM | POA: Diagnosis not present

## 2017-04-24 DIAGNOSIS — Z86010 Personal history of colonic polyps: Secondary | ICD-10-CM

## 2017-04-24 DIAGNOSIS — Z833 Family history of diabetes mellitus: Secondary | ICD-10-CM | POA: Diagnosis not present

## 2017-04-24 DIAGNOSIS — Z800 Family history of malignant neoplasm of digestive organs: Secondary | ICD-10-CM

## 2017-04-24 DIAGNOSIS — K5730 Diverticulosis of large intestine without perforation or abscess without bleeding: Secondary | ICD-10-CM

## 2017-04-24 DIAGNOSIS — K439 Ventral hernia without obstruction or gangrene: Secondary | ICD-10-CM | POA: Diagnosis not present

## 2017-04-24 DIAGNOSIS — Z9049 Acquired absence of other specified parts of digestive tract: Secondary | ICD-10-CM | POA: Diagnosis not present

## 2017-04-24 DIAGNOSIS — R7989 Other specified abnormal findings of blood chemistry: Secondary | ICD-10-CM

## 2017-04-24 DIAGNOSIS — F419 Anxiety disorder, unspecified: Secondary | ICD-10-CM | POA: Diagnosis not present

## 2017-04-24 DIAGNOSIS — Z90710 Acquired absence of both cervix and uterus: Secondary | ICD-10-CM

## 2017-04-24 DIAGNOSIS — Z87442 Personal history of urinary calculi: Secondary | ICD-10-CM | POA: Diagnosis not present

## 2017-04-24 DIAGNOSIS — K429 Umbilical hernia without obstruction or gangrene: Secondary | ICD-10-CM | POA: Diagnosis not present

## 2017-04-24 DIAGNOSIS — R945 Abnormal results of liver function studies: Secondary | ICD-10-CM

## 2017-04-24 DIAGNOSIS — Z813 Family history of other psychoactive substance abuse and dependence: Secondary | ICD-10-CM

## 2017-04-24 DIAGNOSIS — Z1231 Encounter for screening mammogram for malignant neoplasm of breast: Secondary | ICD-10-CM

## 2017-04-24 DIAGNOSIS — Z8249 Family history of ischemic heart disease and other diseases of the circulatory system: Secondary | ICD-10-CM

## 2017-04-24 DIAGNOSIS — Z90721 Acquired absence of ovaries, unilateral: Secondary | ICD-10-CM

## 2017-04-24 DIAGNOSIS — D72829 Elevated white blood cell count, unspecified: Secondary | ICD-10-CM

## 2017-04-24 DIAGNOSIS — R1011 Right upper quadrant pain: Secondary | ICD-10-CM | POA: Diagnosis not present

## 2017-04-24 DIAGNOSIS — R160 Hepatomegaly, not elsewhere classified: Secondary | ICD-10-CM | POA: Diagnosis not present

## 2017-04-24 LAB — CBC WITH DIFFERENTIAL/PLATELET
Basophils Absolute: 0 10*3/uL (ref 0.0–0.1)
Basophils Relative: 0 %
Eosinophils Absolute: 0 10*3/uL (ref 0.0–0.7)
Eosinophils Relative: 0 %
HCT: 43.9 % (ref 36.0–46.0)
Hemoglobin: 15.2 g/dL — ABNORMAL HIGH (ref 12.0–15.0)
Lymphocytes Relative: 11 %
Lymphs Abs: 1.3 10*3/uL (ref 0.7–4.0)
MCH: 29.1 pg (ref 26.0–34.0)
MCHC: 34.6 g/dL (ref 30.0–36.0)
MCV: 84.1 fL (ref 78.0–100.0)
Monocytes Absolute: 0.7 10*3/uL (ref 0.1–1.0)
Monocytes Relative: 5 %
Neutro Abs: 10.1 10*3/uL — ABNORMAL HIGH (ref 1.7–7.7)
Neutrophils Relative %: 84 %
Platelets: 289 10*3/uL (ref 150–400)
RBC: 5.22 MIL/uL — ABNORMAL HIGH (ref 3.87–5.11)
RDW: 13.4 % (ref 11.5–15.5)
WBC: 12.1 10*3/uL — ABNORMAL HIGH (ref 4.0–10.5)

## 2017-04-24 LAB — COMPREHENSIVE METABOLIC PANEL
ALT: 21 U/L (ref 14–54)
AST: 22 U/L (ref 15–41)
Albumin: 4.3 g/dL (ref 3.5–5.0)
Alkaline Phosphatase: 101 U/L (ref 38–126)
Anion gap: 10 (ref 5–15)
BUN: 15 mg/dL (ref 6–20)
CO2: 25 mmol/L (ref 22–32)
Calcium: 9.6 mg/dL (ref 8.9–10.3)
Chloride: 104 mmol/L (ref 101–111)
Creatinine, Ser: 0.88 mg/dL (ref 0.44–1.00)
GFR calc Af Amer: >60 mL/min (ref >60)
GFR calc non Af Amer: >60 mL/min (ref >60)
Glucose, Bld: 110 mg/dL — ABNORMAL HIGH (ref 65–99)
Potassium: 3.7 mmol/L (ref 3.5–5.1)
Sodium: 139 mmol/L (ref 135–145)
Total Bilirubin: 0.4 mg/dL (ref 0.3–1.2)
Total Protein: 8.3 g/dL — ABNORMAL HIGH (ref 6.5–8.1)

## 2017-04-24 LAB — URINALYSIS, ROUTINE W REFLEX MICROSCOPIC
Bacteria, UA: NONE SEEN
Bilirubin Urine: NEGATIVE
Glucose, UA: NEGATIVE mg/dL
Ketones, ur: NEGATIVE mg/dL
Leukocytes, UA: NEGATIVE
Nitrite: NEGATIVE
Protein, ur: NEGATIVE mg/dL
Specific Gravity, Urine: >1.046 — ABNORMAL HIGH (ref 1.005–1.030)
pH: 6 (ref 5.0–8.0)

## 2017-04-24 LAB — I-STAT TROPONIN, ED: Troponin i, poc: 0.01 ng/mL (ref 0.00–0.08)

## 2017-04-24 LAB — LIPASE, BLOOD: Lipase: 31 U/L (ref 11–51)

## 2017-04-24 MED ORDER — ONDANSETRON HCL 4 MG/2ML IJ SOLN
4.0000 mg | Freq: Once | INTRAMUSCULAR | Status: AC
  Administered 2017-04-24: 4 mg via INTRAVENOUS
  Filled 2017-04-24: qty 2

## 2017-04-24 MED ORDER — PROMETHAZINE HCL 25 MG/ML IJ SOLN
25.0000 mg | Freq: Once | INTRAMUSCULAR | Status: AC
  Administered 2017-04-24: 25 mg via INTRAVENOUS
  Filled 2017-04-24: qty 1

## 2017-04-24 MED ORDER — MORPHINE SULFATE (PF) 4 MG/ML IV SOLN
4.0000 mg | Freq: Once | INTRAVENOUS | Status: AC
  Administered 2017-04-24: 4 mg via INTRAVENOUS
  Filled 2017-04-24: qty 1

## 2017-04-24 MED ORDER — RANITIDINE HCL 150 MG PO TABS: 150.0000 mg | ORAL_TABLET | Freq: Two times a day (BID) | ORAL | 0 refills | Status: DC

## 2017-04-24 MED ORDER — HYDROMORPHONE HCL 1 MG/ML IJ SOLN
0.5000 mg | Freq: Once | INTRAMUSCULAR | Status: AC
  Administered 2017-04-24: 0.5 mg via INTRAVENOUS
  Filled 2017-04-24: qty 1

## 2017-04-24 MED ORDER — HYDROCODONE-ACETAMINOPHEN 5-325 MG PO TABS: 1.0000 | ORAL_TABLET | Freq: Four times a day (QID) | ORAL | 0 refills | Status: DC | PRN

## 2017-04-24 MED ORDER — IOPAMIDOL (ISOVUE-300) INJECTION 61%
INTRAVENOUS | Status: AC
  Filled 2017-04-24: qty 100

## 2017-04-24 MED ORDER — ONDANSETRON 8 MG PO TBDP: 8.0000 mg | ORAL_TABLET | Freq: Once | ORAL | Status: DC

## 2017-04-24 MED ORDER — PROMETHAZINE HCL 25 MG PO TABS: 25.0000 mg | ORAL_TABLET | Freq: Four times a day (QID) | ORAL | 0 refills | Status: DC | PRN

## 2017-04-24 MED ORDER — HYDROMORPHONE HCL 1 MG/ML IJ SOLN
1.0000 mg | Freq: Once | INTRAMUSCULAR | Status: AC
  Administered 2017-04-24: 1 mg via INTRAVENOUS
  Filled 2017-04-24: qty 1

## 2017-04-24 MED ORDER — SODIUM CHLORIDE 0.9 % IV BOLUS (SEPSIS)
1000.0000 mL | Freq: Once | INTRAVENOUS | Status: AC
  Administered 2017-04-24: 1000 mL via INTRAVENOUS

## 2017-04-24 MED ORDER — IOPAMIDOL (ISOVUE-300) INJECTION 61%
100.0000 mL | Freq: Once | INTRAVENOUS | Status: AC | PRN
  Administered 2017-04-24: 100 mL via INTRAVENOUS

## 2017-04-24 NOTE — ED Notes (Signed)
Attempted IV start w/ blood draw unsuccessful.

## 2017-04-24 NOTE — ED Provider Notes (Signed)
[facility] DEPT Provider Note   CSN: [id number] Arrival date & time: 04/24/17  1809     History   Chief Complaint Chief Complaint  Patient presents with  . Abdominal Pain  . abdominal distention    HPI Breiona [name] is a 51 y.o. female with a PMHx of colonic polyps, GERD, depression, nephrolithiasis, hemorrhoids, and a PSHx of abd hysterectomy with b/l oophorectomy and lysis of adhesions, ex-lap and removal of R ectopic pregnancy, transvaginal tape bladder suspension procedure, multiple cystoscopies with stent placements, and umbilical hernia repair (Dr. Rise Patience in 2003), with additional history as listed below, who presents to the ED with complaints of sudden onset gradually worsening upper abdominal pain originally began at 7 AM but worsened around 4 PM which was approximately 3 hours prior to evaluation. She describes her pain as 10/10 constant waxing and waning squeezing upper abdominal pain mostly in the epigastrium, nonradiating, worse with bending or movement, and with no treatments tried prior to arrival. She reports that she feels bloated and distended, and she's had belching, nausea, and 3 episodes of nonbloody nonbilious emesis upon arrival to the ED. She states this does not feel like her kidney stones.  She denies fevers, chills, CP, SOB, diarrhea/constipation, obstipation, melena, hematochezia, hematemesis, hematuria, dysuria, flank pain, vaginal bleeding/discharge, myalgias, arthralgias, numbness, tingling, focal weakness, or any other complaints at this time. Denies recent travel, sick contacts, suspicious food intake, EtOH use, NSAID use, or recent abx. Her PCP is Dr. Garnette Gunner at [facility].    The history is provided by the patient and medical records. No language interpreter was used.  Abdominal Pain   This is a new problem. The current episode started 6 to 12 hours ago. The problem occurs constantly. The problem has been gradually worsening. The pain is  associated with an unknown factor. The pain is located in the LUQ, RUQ and epigastric region. Quality: squeezing. The pain is at a severity of 10/10. The pain is severe. Associated symptoms include belching, nausea and vomiting. Pertinent negatives include fever, diarrhea, flatus, hematochezia, melena, constipation, dysuria, hematuria, arthralgias and myalgias. The symptoms are aggravated by certain positions. Nothing relieves the symptoms. Her past medical history is significant for GERD.    Past Medical History:  Diagnosis Date  . Anxiety   . Depression   . GERD (gastroesophageal reflux disease)   . History of abnormal cervical Pap smear    1991 -- 1995--hx cryotherapy to cervix  . History of colon polyps    2008- BENIGN  . Hydronephrosis, left   . Kidney stones 2016  . PONV (postoperative nausea and vomiting)     Patient Active Problem List   Diagnosis Date Noted  . Adjustment disorder with anxiety 11/01/2014  . COLONIC POLYPS, HYPERPLASTIC 05/15/2008  . HEMORRHOIDS, INTERNAL 05/15/2008  . NEPHROLITHIASIS, HX OF 09/29/2007    Past Surgical History:  Procedure Laterality Date  . ABDOMINAL HYSTERECTOMY    . ANTERIOR AND POSTERIOR REPAIR WITH SACROSPINOUS FIXATION N/A 08/16/2015   Procedure: ANTERIOR COLPORRHAPHY WITH XENOFORM GRAFT AND SACROSPINOUS FIXATION;  Surgeon: Nunzio Cobbs, MD;  Location: [facility];  Service: Gynecology;  Laterality: N/A;  2.5 hours OR time  . BLADDER SUSPENSION N/A 08/16/2015   Procedure: TRANSVAGINAL TAPE (TVT) PROCEDURE exact midurethral sling;  Surgeon: Nunzio Cobbs, MD;  Location: [facility];  Service: Gynecology;  Laterality: N/A;  . COLONOSCOPY W/ POLYPECTOMY  05-30-2007  . CYSTOSCOPY N/A 08/16/2015   Procedure: CYSTOSCOPY;  Surgeon: Everardo All  Yisroel Ramming, MD;  Location: [facility];  Service: Gynecology;  Laterality: N/A;  . CYSTOSCOPY W/ RETROGRADES Bilateral 06/22/2015   Procedure: CYSTOSCOPY WITH RETROGRADE PYELOGRAM;  Surgeon:  Cleon Gustin, MD;  Location: [facility];  Service: Urology;  Laterality: Bilateral;  . CYSTOSCOPY W/ URETERAL STENT PLACEMENT  02/  2000  . CYSTOSCOPY WITH HOLMIUM LASER LITHOTRIPSY Left 05/18/2015   Procedure: CYSTOSCOPY WITH HOLMIUM LASER LITHOTRIPSY;  Surgeon: Alexis Frock, MD;  Location: [facility];  Service: Urology;  Laterality: Left;  . CYSTOSCOPY WITH RETROGRADE PYELOGRAM, URETEROSCOPY AND STENT PLACEMENT Left 06/22/2015   Procedure: CYSTOSCOPY,  LEFT URETEROSCOPY;  Surgeon: Cleon Gustin, MD;  Location: [facility];  Service: Urology;  Laterality: Left;  . CYSTOSCOPY WITH URETEROSCOPY AND STENT PLACEMENT Left 05/18/2015   Procedure: CYSTOSCOPY WITH URETEROSCOPY, STONE MANIPULATION AND STENT PLACEMENT;  Surgeon: Alexis Frock, MD;  Location: [facility];  Service: Urology;  Laterality: Left;  . DIAGNOSTIC LAPAROSCOPY    . DX LAPAROSCOPY  X2  . ESOPHAGOGASTRODUODENOSCOPY  05-09-2007  . EXPLORATORY LAPAROTOMY W/ BILATERAL SALPINGECTOMY AND REMOVAL RIGHT ECTOPIC PREG.  02-23-2004  . EXTRACORPOREAL SHOCK WAVE LITHOTRIPSY  2001  &  2002  . LAPAROSCOPIC CHOLECYSTECTOMY  1999  . SHOULDER ARTHROSCOPY WITH OPEN ROTATOR CUFF REPAIR Right 2012  . TOTAL ABDOMINAL HYSTERECTOMY W/ BILATERAL OOPHORECTOMY AND LYSIS ADHESIONS  09-02-2007  . TUBAL LIGATION Bilateral 1995  . UMBILICAL HERNIA REPAIR  2003  . VAGINAL HYSTERECTOMY N/A 08/18/2015   Procedure: Exam under Anesthesia, Excision Xenform Graft, Removal Bilateral Sacrospinous Ligament sutures;  Surgeon: Nunzio Cobbs, MD;  Location: [facility];  Service: Gynecology;  Laterality: N/A;    OB History    Gravida Para Term Preterm AB Living   3 2 2   1 2    SAB TAB Ectopic Multiple Live Births       1           Home Medications    Prior to Admission medications   Medication Sig Start Date End Date Taking? Authorizing Provider  ALPRAZolam Duanne Moron) 0.5 MG tablet Take  1 tablet (0.5 mg total) by mouth daily as needed for anxiety. 05/10/16   Jearld Fenton, NP  buPROPion (WELLBUTRIN XL) 300 MG 24 hr tablet Take 1 tablet (300 mg total) by mouth daily. 02/20/17   Jearld Fenton, NP  buPROPion (WELLBUTRIN XL) 300 MG 24 hr tablet TAKE 1 TABLET (300 MG TOTAL) BY MOUTH DAILY. 03/19/17   Jearld Fenton, NP  cholecalciferol (VITAMIN D) 1000 units tablet Take 1,000 Units by mouth daily.    Historical Provider, MD  Multiple Vitamin (MULTIVITAMIN WITH MINERALS) TABS tablet Take 1 tablet by mouth daily.    Historical Provider, MD  Omega-3 Fatty Acids (FISH OIL PO) Take 1 capsule by mouth daily.    Historical Provider, MD    Family History Family History  Problem Relation Age of Onset  . Hypertension Mother   . Diabetes Father   . Cancer Paternal Uncle     Pancreas  . Heart attack Maternal Grandmother   . Drug abuse Sister   . Colon cancer Neg Hx   . Esophageal cancer Neg Hx   . Stomach cancer Neg Hx   . Rectal cancer Neg Hx     Social History Social History  Substance Use Topics  . Smoking status: Former Smoker    Years: 15.00    Types: Cigarettes    Quit date:  05/16/2008  . Smokeless tobacco: Never Used  . Alcohol use No     Allergies   Desvenlafaxine and Iohexol   Review of Systems Review of Systems  Constitutional: Negative for chills and fever.  Respiratory: Negative for shortness of breath.   Cardiovascular: Negative for chest pain.  Gastrointestinal: Positive for abdominal distention, abdominal pain, nausea and vomiting. Negative for blood in stool, constipation, diarrhea, flatus, hematochezia and melena.  Genitourinary: Negative for dysuria, flank pain, hematuria, vaginal bleeding and vaginal discharge.  Musculoskeletal: Negative for arthralgias and myalgias.  Skin: Negative for color change.  Allergic/Immunologic: Negative for immunocompromised state.  Neurological: Negative for weakness and numbness.  Psychiatric/Behavioral: Negative for  confusion.   All other systems reviewed and are negative for acute change except as noted in the HPI.    Physical Exam Updated Vital Signs BP (!) 146/78 (BP Location: Left Arm)   Pulse (!) 106   Temp 97.8 F (36.6 C) (Oral)   Resp 20   Ht 5\' 6"  (1.676 m)   Wt 104.3 kg   LMP 12/24/2006 (Approximate)   SpO2 100%   BMI 37.12 kg/m   Physical Exam  Constitutional: She is oriented to person, place, and time. Vital signs are normal. She appears well-developed and well-nourished.  Non-toxic appearance. She appears distressed (uncomfortable appearing).  Afebrile, nontoxic, uncomfortable appearing, dry heaving intermittently  HENT:  Head: Normocephalic and atraumatic.  Mouth/Throat: Oropharynx is clear and moist. Mucous membranes are dry.  Mildly dry lips  Eyes: Conjunctivae and EOM are normal. Right eye exhibits no discharge. Left eye exhibits no discharge.  Neck: Normal range of motion. Neck supple.  Cardiovascular: Regular rhythm, normal heart sounds and intact distal pulses.  Tachycardia present.  Exam reveals no gallop and no friction rub.   No murmur heard. Mildly tachycardic, reg rhythm, nl s1/s2, no m/r/g   Pulmonary/Chest: Effort normal and breath sounds normal. No respiratory distress. She has no decreased breath sounds. She has no wheezes. She has no rhonchi. She has no rales.  Abdominal: Soft. Normal appearance. She exhibits no distension (no obvious distension but exam limited due to body habitus). Bowel sounds are decreased. There is tenderness in the right upper quadrant, epigastric area and left upper quadrant. There is no rigidity, no rebound, no guarding, no CVA tenderness, no tenderness at McBurney's point and negative Murphy's sign. A hernia is present. Hernia confirmed positive in the ventral area.  Soft, obese abdomen which limits exam, but no frank or obvious distension noted, ventral hernia above umbilicus which is reducible but has diffuse tenderness in this area,  adequate bowel sounds in lower abdomen but slightly hypoactive bowel sounds in upper abdomen, with moderate TTP across entire upper abdomen, no r/g/r, neg murphy's, neg mcburney's, no CVA TTP   Musculoskeletal: Normal range of motion.  Neurological: She is alert and oriented to person, place, and time. She has normal strength. No sensory deficit.  Skin: Skin is warm, dry and intact. No rash noted.  Psychiatric: She has a normal mood and affect.  Nursing note and vitals reviewed.    ED Treatments / Results  Labs (all labs ordered are listed, but only abnormal results are displayed) Labs Reviewed  COMPREHENSIVE METABOLIC PANEL - Abnormal; Notable for the following:       Result Value   Glucose, Bld 110 (*)    Total Protein 8.3 (*)    All other components within normal limits  CBC WITH DIFFERENTIAL/PLATELET - Abnormal; Notable for the following:  WBC 12.1 (*)    RBC 5.22 (*)    Hemoglobin 15.2 (*)    Neutro Abs 10.1 (*)    All other components within normal limits  LIPASE, BLOOD  URINALYSIS, ROUTINE W REFLEX MICROSCOPIC  I-STAT TROPOININ, ED    EKG  EKG Interpretation  Date/Time:  Wednesday Apr 24 2017 19:22:43 EDT Ventricular Rate:  99 PR Interval:    QRS Duration: 100 QT Interval:  343 QTC Calculation: 441 R Axis:   28 Text Interpretation:  Sinus rhythm Low voltage, precordial leads Probable anteroseptal infarct, old No significant change since last tracing Confirmed by Winfred Leeds  MD, SAM [phone]) on 04/24/2017 7:25:05 PM       Radiology Ct Abdomen Pelvis W Contrast  Result Date: 04/24/2017 CLINICAL DATA:  Diffuse abdominal pain for 4 hours, accompanied by nausea and vomiting. EXAM: CT ABDOMEN AND PELVIS WITH CONTRAST TECHNIQUE: Multidetector CT imaging of the abdomen and pelvis was performed using the standard protocol following bolus administration of intravenous contrast. CONTRAST:  190mL ISOVUE-300 IOPAMIDOL (ISOVUE-300) INJECTION 61% COMPARISON:  08/24/2016 FINDINGS:  Lower chest: No acute abnormality. Hepatobiliary: Cholecystectomy. Mild fatty infiltration of the liver without focal lesion. Normal bile ducts. Pancreas: Unremarkable. No pancreatic ductal dilatation or surrounding inflammatory changes. Spleen: Normal in size without focal abnormality. Adrenals/Urinary Tract: Adrenal glands are unremarkable. Kidneys are normal, without renal calculi, focal lesion, or hydronephrosis. Bladder is unremarkable. Stomach/Bowel: Small hiatal hernia. Stomach and small bowel are otherwise unremarkable. Normal appendix. Mild uncomplicated diverticulosis. No bowel obstruction. No focal inflammation of bowel. Vascular/Lymphatic: No significant vascular findings are present. No enlarged abdominal or pelvic lymph nodes. Reproductive: Status post hysterectomy. No adnexal masses. Other: Midline ventral hernia just above the umbilicus, containing fat or omentum. No acute inflammation or abnormal fluid collection associated with the hernia. Musculoskeletal: No significant skeletal lesion. IMPRESSION: 1. Mild hepatic steatosis. 2. Small hiatal hernia. 3. Uncomplicated colonic diverticulosis. 4. Midline ventral hernia containing fat or omentum. Electronically Signed   By: Andreas Newport [name]   On: 04/24/2017 21:14    Procedures Procedures (including critical care time)  Medications Ordered in ED Medications  ondansetron (ZOFRAN) injection 4 mg (not administered)  sodium chloride 0.9 % bolus 1,000 mL (0 mLs Intravenous Stopped 04/24/17 2109)  morphine 4 MG/ML injection 4 mg (4 mg Intravenous Given 04/24/17 1920)  ondansetron (ZOFRAN) injection 4 mg (4 mg Intravenous Given 04/24/17 1919)  HYDROmorphone (DILAUDID) injection 1 mg (1 mg Intravenous Given 04/24/17 2026)  promethazine (PHENERGAN) injection 25 mg (25 mg Intravenous Given 04/24/17 2021)  iopamidol (ISOVUE-300) 61 % injection 100 mL (100 mLs Intravenous Contrast Given 04/24/17 2055)     Initial Impression / Assessment and Plan / ED  Course  I have reviewed the triage vital signs and the nursing notes.  Pertinent labs & imaging results that were available during my care of the patient were reviewed by me and considered in my medical decision making (see chart for details).     51 y.o. female here with upper abd pain x12hrs that got suddenly worse about 3hrs PTA. Feels bloated/distended and having n/v. On exam, obese which limits exam, without frank distension but hard to tell due to body habitus, mildly dry lips, mildly tachycardic, with moderate upper abd TTP diffusely across entire upper abd, neg murphy's (pt s/p cholecystectomy), no lower abd tenderness, nonperitoneal. Adequate bowel sounds in lower abdomen but slightly hypoactive in upper abdomen. Pt with hx of multiple abdominal surgeries. Will proceed with labs, trop, EKG, and CT abd  to eval for perf/obstruction/etc. Of note, she's allergic to old IV dye, but has had newer IV dye studies and had no issues; most recent contrast CT study in our system was 05/05/15 and she did NOT need premedication for this. Will give pain/nausea meds, fluids, and reassess shortly  10:38 PM CBC w/diff with mild leukocytosis but counts appear hemoconcentrated so unclear if truly significant. CMP essentially unremarkable. Lipase WNL. U/A not yet obtained, she will attempt that now. Trop negative. EKG nonischemic and unchanged from prior. CT abd/pelv showing hepatic steatosis and small hiatal hernia as well as uncomplicated colonic diverticulosis, and midline ventral hernia containing fat or omentum but no abnormal fluid collection associated. No obstruction seen. Pt drowsy on recheck, but easily aroused; states she's feeling much better after morphine 4mg  and Dilaudid 1mg , and zofran 4mg  plus phenergan 25mg . States nausea and pain have improved. Given CT findings of ventral hernia and small hiatal hernia, will discuss case with surgeon. Will reassess shortly. Will attempt to PO challenge.    10:49  PM Dr. Hassell Done of general surgery returning page, states that he has a low level of concern for needing to emergently operate on her, and that actually it would be preferential to have a scheduled outpatient surgery for her hernia repair given her extensive abd surgery history. Would like her to f/up with the office this week for ongoing management of this finding. Does not feel she needs admission or surgical repair tonight of this. Awaiting U/A and PO challenge, will reassess shortly.   11:02 PM While going through potential d/c instructions, pt states she still feels nauseated and wants another dose of antiemetic; ordered 4mg  more of zofran (pt too drowsy to attempt ativan or other antiemetic solution that would increase drowsiness). Then she asked for ice water; when I came back to give it to her, she had started vomiting again. Given intractable nausea/vomiting, will admit. U/A collected and in process but not yet resulted.   11:25 PM Still haven't heard back from [facility] hospitalist regarding admission. Patient care to be resumed by Dr. Winfred Leeds at shift change sign-out. He will be re-paging hospitalist for admission. Please see his notes for documentation of admission. Pt requesting more pain meds as well. More awake, will give another 0.5mg  dilaudid now.    Final Clinical Impressions(s) / ED Diagnoses   Final diagnoses:  Upper abdominal pain  Nausea and vomiting in adult patient  Leukocytosis, unspecified type  Hiatal hernia  Ventral hernia without obstruction or gangrene  Diverticulosis of large intestine without hemorrhage  Intractable vomiting with nausea, unspecified vomiting type      [address], PA-C 04/24/17 2326    Orlie Dakin, MD 04/25/17 0010

## 2017-04-24 NOTE — ED Notes (Signed)
Cyril Mourning, RN at bedside attempting IV start w/ blood draw.

## 2017-04-24 NOTE — ED Notes (Signed)
Pt ambulated to bathroom w/ this Probation officer and [place], [place]. Pt states she still feels weak and nauseous after ambulating. Pt attempting PO challenge now however reports fluids make her more nauseous.

## 2017-04-24 NOTE — ED Triage Notes (Signed)
Patient c/o abdominal pain and abdominal distention that started today. Patient states she has had a couple of soft BM's today. Patient also c/o nausea, but no vomiting.

## 2017-04-24 NOTE — ED Provider Notes (Addendum)
Complains of diffuse abdominal pain severe, onset 4 hours prior to coming here. This is with nausea and vomiting. Last bowel movement this afternoon which was "soft" nothing makes symptoms better or worse. She on exam appears uncomfortable abdomen diminished bowel sounds, obese, diffusely tender without guarding rigidity or rebound   Orlie Dakin, MD 04/24/17 2003 Dr. Alcario Drought consulted and will see patient in ED and evaluate for overnight stay.   Orlie Dakin, MD 04/24/17 [phone]

## 2017-04-24 NOTE — Discharge Instructions (Signed)
Your abdominal pain is likely from the hiatal hernia and ventral hernia, but could also be related to gastritis or an ulcer. You will need to take zantac as directed, and avoid spicy/fatty/acidic foods, avoid soda/coffee/tea/alcohol. Avoid laying down flat within 30 minutes of eating. Avoid NSAIDs like ibuprofen/aleve/motrin/etc on an empty stomach. May consider using over the counter tums/maalox as needed for additional relief. Use phenergan as directed as needed for nausea. Use tylenol OR norco as needed for pain but don't drive or operate machinery while taking this medication. Increase the fiber and water in your diet. Follow up with your regular doctor in one week for recheck of symptoms. Follow up with the surgeon in 1 week for ongoing management of your hernia and to discuss scheduling repair of that issue. Return to the ER for changes or worsening symptoms.  Abdominal (belly) pain can be caused by many things. Your caregiver performed an examination and possibly ordered blood/urine tests and imaging (CT scan, x-rays, ultrasound). Many cases can be observed and treated at home after initial evaluation in the emergency department. Even though you are being discharged home, abdominal pain can be unpredictable. Therefore, you need a repeated exam if your pain does not resolve, returns, or worsens. Most patients with abdominal pain don't have to be admitted to the hospital or have surgery, but serious problems like appendicitis and gallbladder attacks can start out as nonspecific pain. Many abdominal conditions cannot be diagnosed in one visit, so follow-up evaluations are very important. SEEK IMMEDIATE MEDICAL ATTENTION IF YOU DEVELOP ANY OF THE FOLLOWING SYMPTOMS: The pain does not go away or becomes severe.  A temperature above 101 develops.  Repeated vomiting occurs (multiple episodes).  The pain becomes localized to portions of the abdomen. The right side could possibly be appendicitis. In an adult,  the left lower portion of the abdomen could be colitis or diverticulitis.  Blood is being passed in stools or vomit (bright red or black tarry stools).  Return also if you develop chest pain, difficulty breathing, dizziness or fainting, or become confused, poorly responsive, or inconsolable (young children). The constipation stays for more than 4 days.  There is belly (abdominal) or rectal pain.  You do not seem to be getting better.

## 2017-04-24 NOTE — ED Notes (Signed)
Pt aware we need urine sample.  

## 2017-04-25 DIAGNOSIS — K439 Ventral hernia without obstruction or gangrene: Secondary | ICD-10-CM

## 2017-04-25 DIAGNOSIS — R1013 Epigastric pain: Secondary | ICD-10-CM | POA: Diagnosis present

## 2017-04-25 DIAGNOSIS — R112 Nausea with vomiting, unspecified: Secondary | ICD-10-CM | POA: Diagnosis present

## 2017-04-25 DIAGNOSIS — K429 Umbilical hernia without obstruction or gangrene: Secondary | ICD-10-CM | POA: Diagnosis not present

## 2017-04-25 DIAGNOSIS — R1010 Upper abdominal pain, unspecified: Secondary | ICD-10-CM

## 2017-04-25 DIAGNOSIS — R109 Unspecified abdominal pain: Secondary | ICD-10-CM | POA: Diagnosis present

## 2017-04-25 LAB — COMPREHENSIVE METABOLIC PANEL
ALT: 59 U/L — ABNORMAL HIGH (ref 14–54)
AST: 57 U/L — ABNORMAL HIGH (ref 15–41)
Albumin: 3.5 g/dL (ref 3.5–5.0)
Alkaline Phosphatase: 90 U/L (ref 38–126)
Anion gap: 8 (ref 5–15)
BUN: 15 mg/dL (ref 6–20)
CO2: 23 mmol/L (ref 22–32)
Calcium: 8.1 mg/dL — ABNORMAL LOW (ref 8.9–10.3)
Chloride: 108 mmol/L (ref 101–111)
Creatinine, Ser: 0.87 mg/dL (ref 0.44–1.00)
GFR calc Af Amer: >60 mL/min (ref >60)
GFR calc non Af Amer: >60 mL/min (ref >60)
Glucose, Bld: 143 mg/dL — ABNORMAL HIGH (ref 65–99)
Potassium: 4.2 mmol/L (ref 3.5–5.1)
Sodium: 139 mmol/L (ref 135–145)
Total Bilirubin: 0.7 mg/dL (ref 0.3–1.2)
Total Protein: 6.8 g/dL (ref 6.5–8.1)

## 2017-04-25 LAB — CBC
HCT: 40 % (ref 36.0–46.0)
Hemoglobin: 13.6 g/dL (ref 12.0–15.0)
MCH: 28.9 pg (ref 26.0–34.0)
MCHC: 34 g/dL (ref 30.0–36.0)
MCV: 85.1 fL (ref 78.0–100.0)
Platelets: 195 10*3/uL (ref 150–400)
RBC: 4.7 MIL/uL (ref 3.87–5.11)
RDW: 13.7 % (ref 11.5–15.5)
WBC: 7.1 10*3/uL (ref 4.0–10.5)

## 2017-04-25 LAB — HIV ANTIBODY (ROUTINE TESTING W REFLEX): HIV Screen 4th Generation wRfx: NONREACTIVE

## 2017-04-25 MED ORDER — CYCLOBENZAPRINE HCL 10 MG PO TABS
10.0000 mg | ORAL_TABLET | Freq: Three times a day (TID) | ORAL | Status: DC | PRN
  Administered 2017-04-25: 10 mg via ORAL
  Filled 2017-04-25: qty 1

## 2017-04-25 MED ORDER — SODIUM CHLORIDE 0.9 % IV SOLN
INTRAVENOUS | Status: DC
  Administered 2017-04-25 – 2017-04-28 (×6): via INTRAVENOUS

## 2017-04-25 MED ORDER — HYDROMORPHONE HCL 2 MG/ML IJ SOLN
1.0000 mg | INTRAMUSCULAR | Status: DC | PRN
  Administered 2017-04-25: 1 mg via INTRAVENOUS
  Filled 2017-04-25: qty 1

## 2017-04-25 MED ORDER — ENOXAPARIN SODIUM 60 MG/0.6ML ~~LOC~~ SOLN
50.0000 mg | SUBCUTANEOUS | Status: DC
  Administered 2017-04-25 – 2017-04-29 (×5): 50 mg via SUBCUTANEOUS
  Filled 2017-04-25 (×5): qty 0.6

## 2017-04-25 MED ORDER — OXYCODONE-ACETAMINOPHEN 5-325 MG PO TABS
1.0000 | ORAL_TABLET | ORAL | Status: DC | PRN
Start: 2017-04-25 — End: 2017-04-28
  Administered 2017-04-25 (×2): 2 via ORAL
  Administered 2017-04-25: 1 via ORAL
  Administered 2017-04-26 – 2017-04-27 (×5): 2 via ORAL
  Filled 2017-04-25 (×9): qty 2

## 2017-04-25 MED ORDER — HYDROMORPHONE HCL 1 MG/ML IJ SOLN
1.0000 mg | INTRAMUSCULAR | Status: DC | PRN
  Administered 2017-04-25: 1 mg via INTRAVENOUS
  Filled 2017-04-25: qty 1

## 2017-04-25 MED ORDER — HYDROMORPHONE HCL 1 MG/ML IJ SOLN
0.5000 mg | Freq: Once | INTRAMUSCULAR | Status: AC
  Administered 2017-04-25: 0.5 mg via INTRAVENOUS
  Filled 2017-04-25: qty 1

## 2017-04-25 MED ORDER — ONDANSETRON HCL 4 MG/2ML IJ SOLN
4.0000 mg | Freq: Four times a day (QID) | INTRAMUSCULAR | Status: DC | PRN
  Administered 2017-04-25 – 2017-04-27 (×8): 4 mg via INTRAVENOUS
  Filled 2017-04-25 (×8): qty 2

## 2017-04-25 MED ORDER — BUPROPION HCL ER (XL) 150 MG PO TB24
300.0000 mg | ORAL_TABLET | Freq: Every day | ORAL | Status: DC
  Administered 2017-04-25 – 2017-04-29 (×5): 300 mg via ORAL
  Filled 2017-04-25 (×5): qty 2

## 2017-04-25 MED ORDER — PROMETHAZINE HCL 25 MG PO TABS
12.5000 mg | ORAL_TABLET | Freq: Four times a day (QID) | ORAL | Status: DC | PRN
  Administered 2017-04-25 – 2017-04-27 (×7): 12.5 mg via ORAL
  Filled 2017-04-25 (×7): qty 1

## 2017-04-25 NOTE — Consult Note (Signed)
[facility] Surgery Consult Note  [name] Feb 04, 1966  [id number].    Requesting MD: Lonny Prude Chief Complaint/Reason for Consult: Ventral hernia  HPI:  [name] is a 51yo female with h/o multiple abdominal surgeries who was admitted to [facility] last night with severe abdominal pain, nausea, and vomiting. States that the pain began around 1600 yesterday and progressively got worse. Pain is mostly epigastric and central abdomen. Constant but worse with movement or palpation. Denies fever, chills, diarrhea, constipation. Last BM was yesterday afternoon. States that she is not passing flatus today. Upon admission CT scan showed a midline ventral hernia containing fat or omentum. WBC 15.2 yesterday and WNL today; she is afebrile. Initially with pain medication her pain improved, but this morning her pain and nausea returned. General surgery asked to consult.  Last meal yesterday, only had a few sips of ginger ale today.  PMH significant for anxiety/depression Abdominal surgical history: diagnostic laparoscopy x2 early 90's; Tubal ligation 1995;  lap chole ~1996 Dr. Rise Patience; umbilical hernia repair 1191 Dr. Rise Patience; ex lap bilateral salpingectomy/removal ectopic pregnancy 2005 Anticoagulants: none Nonsmoker Employment: sales  ROS: Review of Systems  Constitutional: Negative.   HENT: Negative.   Eyes: Negative.   Respiratory: Negative.   Cardiovascular: Negative.   Gastrointestinal: Positive for abdominal pain, nausea and vomiting. Negative for blood in stool, constipation, diarrhea, heartburn and melena.  Genitourinary: Negative.   Musculoskeletal: Negative.   Skin: Negative.   Neurological: Negative.   All systems reviewed and otherwise negative except for as above  Family History  Problem Relation Age of Onset  . Hypertension Mother   . Diabetes Father   . Cancer Paternal Uncle     Pancreas  . Heart attack Maternal Grandmother   . Drug abuse Sister   . Colon cancer  Neg Hx   . Esophageal cancer Neg Hx   . Stomach cancer Neg Hx   . Rectal cancer Neg Hx     Past Medical History:  Diagnosis Date  . Anxiety   . Depression   . GERD (gastroesophageal reflux disease)   . History of abnormal cervical Pap smear    1991 -- 1995--hx cryotherapy to cervix  . History of colon polyps    2008- BENIGN  . Hydronephrosis, left   . Kidney stones 2016  . PONV (postoperative nausea and vomiting)     Past Surgical History:  Procedure Laterality Date  . ABDOMINAL HYSTERECTOMY    . ANTERIOR AND POSTERIOR REPAIR WITH SACROSPINOUS FIXATION N/A 08/16/2015   Procedure: ANTERIOR COLPORRHAPHY WITH XENOFORM GRAFT AND SACROSPINOUS FIXATION;  Surgeon: Nunzio Cobbs, MD;  Location: [facility];  Service: Gynecology;  Laterality: N/A;  2.5 hours OR time  . BLADDER SUSPENSION N/A 08/16/2015   Procedure: TRANSVAGINAL TAPE (TVT) PROCEDURE exact midurethral sling;  Surgeon: Nunzio Cobbs, MD;  Location: [facility];  Service: Gynecology;  Laterality: N/A;  . COLONOSCOPY W/ POLYPECTOMY  05-30-2007  . CYSTOSCOPY N/A 08/16/2015   Procedure: CYSTOSCOPY;  Surgeon: Nunzio Cobbs, MD;  Location: [facility];  Service: Gynecology;  Laterality: N/A;  . CYSTOSCOPY W/ RETROGRADES Bilateral 06/22/2015   Procedure: CYSTOSCOPY WITH RETROGRADE PYELOGRAM;  Surgeon: Cleon Gustin, MD;  Location: [facility];  Service: Urology;  Laterality: Bilateral;  . CYSTOSCOPY W/ URETERAL STENT PLACEMENT  02/  2000  . CYSTOSCOPY WITH HOLMIUM LASER LITHOTRIPSY Left 05/18/2015   Procedure: CYSTOSCOPY WITH HOLMIUM LASER LITHOTRIPSY;  Surgeon: Alexis Frock, MD;  Location: [facility];  Service: Urology;  Laterality: Left;  . CYSTOSCOPY WITH RETROGRADE PYELOGRAM, URETEROSCOPY AND STENT PLACEMENT Left 06/22/2015   Procedure: CYSTOSCOPY,  LEFT URETEROSCOPY;  Surgeon: Cleon Gustin, MD;  Location: [facility];  Service: Urology;  Laterality: Left;   . CYSTOSCOPY WITH URETEROSCOPY AND STENT PLACEMENT Left 05/18/2015   Procedure: CYSTOSCOPY WITH URETEROSCOPY, STONE MANIPULATION AND STENT PLACEMENT;  Surgeon: Alexis Frock, MD;  Location: [facility];  Service: Urology;  Laterality: Left;  . DIAGNOSTIC LAPAROSCOPY    . DX LAPAROSCOPY  X2  . ESOPHAGOGASTRODUODENOSCOPY  05-09-2007  . EXPLORATORY LAPAROTOMY W/ BILATERAL SALPINGECTOMY AND REMOVAL RIGHT ECTOPIC PREG.  02-23-2004  . EXTRACORPOREAL SHOCK WAVE LITHOTRIPSY  2001  &  2002  . LAPAROSCOPIC CHOLECYSTECTOMY  1999  . SHOULDER ARTHROSCOPY WITH OPEN ROTATOR CUFF REPAIR Right 2012  . TOTAL ABDOMINAL HYSTERECTOMY W/ BILATERAL OOPHORECTOMY AND LYSIS ADHESIONS  09-02-2007  . TUBAL LIGATION Bilateral 1995  . UMBILICAL HERNIA REPAIR  2003  . VAGINAL HYSTERECTOMY N/A 08/18/2015   Procedure: Exam under Anesthesia, Excision Xenform Graft, Removal Bilateral Sacrospinous Ligament sutures;  Surgeon: Nunzio Cobbs, MD;  Location: [facility];  Service: Gynecology;  Laterality: N/A;    Social History:  reports that she quit smoking about 8 years ago. Her smoking use included Cigarettes. She quit after 15.00 years of use. She has never used smokeless tobacco. She reports that she does not drink alcohol or use drugs.  Allergies:  Allergies  Allergen Reactions  . Desvenlafaxine Other (See Comments)    REACTION: withdrawl  . Iohexol      Code: RASH, Desc: PT STATES BACK IN THE 70S SHE HAD IV DYE REACTION.04/30/07/RM---pt given omnipaque 300 w/out pre meds; no complications 0/86/57 slg, Onset Date: [id number]     Medications Prior to Admission  Medication Sig Dispense Refill  . buPROPion (WELLBUTRIN XL) 300 MG 24 hr tablet Take 1 tablet (300 mg total) by mouth daily. 90 tablet 0  . cholecalciferol (VITAMIN D) 1000 units tablet Take 1,000 Units by mouth daily.    Marland Kitchen ibuprofen (ADVIL,MOTRIN) 200 MG tablet Take 400 mg by mouth 2 (two) times daily.    . Multiple Vitamin (MULTIVITAMIN  WITH MINERALS) TABS tablet Take 1 tablet by mouth daily.    . Omega-3 Fatty Acids (FISH OIL PO) Take 1 capsule by mouth daily.      Prior to Admission medications   Medication Sig Start Date End Date Taking? Authorizing Provider  buPROPion (WELLBUTRIN XL) 300 MG 24 hr tablet Take 1 tablet (300 mg total) by mouth daily. 02/20/17  Yes Jearld Fenton, NP  cholecalciferol (VITAMIN D) 1000 units tablet Take 1,000 Units by mouth daily.   Yes Historical Provider, MD  ibuprofen (ADVIL,MOTRIN) 200 MG tablet Take 400 mg by mouth 2 (two) times daily.   Yes Historical Provider, MD  Multiple Vitamin (MULTIVITAMIN WITH MINERALS) TABS tablet Take 1 tablet by mouth daily.   Yes Historical Provider, MD  Omega-3 Fatty Acids (FISH OIL PO) Take 1 capsule by mouth daily.   Yes Historical Provider, MD    Blood pressure 119/76, pulse (!) 104, temperature 98.7 F (37.1 C), temperature source Oral, resp. rate 20, height '5\' 6"'$  (1.676 m), weight 234 lb (106.1 kg), last menstrual period 12/24/2006, SpO2 97 %. Physical Exam: General: pleasant, WD/WN white female who is laying in bed in NAD but appears uncomfortable HEENT: head is normocephalic, atraumatic.  Sclera are noninjected.  Pupils equal and round.  Ears  and nose without any masses or lesions.  Mouth is pink and moist. Dentition fair Heart: regular, rate, and rhythm.  No obvious murmurs, gallops, or rubs noted.  Palpable pedal pulses bilaterally Lungs: CTAB, no wheezes, rhonchi, or rales noted.  Respiratory effort nonlabored Abd: well healed lower transverse incision and multiple lap incisions; obese, soft, ND, +BS, no erythema, TTP epigastric/mid-central region of abdomen with voluntary guarding, unable to palpate hernia MS: all 4 extremities are symmetrical with no cyanosis, clubbing, or edema. Skin: warm and dry with no masses, lesions, or rashes Psych: A&Ox3 with an appropriate affect. Neuro: cranial nerves grossly intact, extremity CSM intact bilaterally,  normal speech  Results for orders placed or performed during the hospital encounter of 04/24/17 (from the past 48 hour(s))  Lipase, blood     Status: None   Collection Time: 04/24/17  7:21 PM  Result Value Ref Range   Lipase 31 11 - 51 U/L  Comprehensive metabolic panel     Status: Abnormal   Collection Time: 04/24/17  7:21 PM  Result Value Ref Range   Sodium 139 135 - 145 mmol/L   Potassium 3.7 3.5 - 5.1 mmol/L   Chloride 104 101 - 111 mmol/L   CO2 25 22 - 32 mmol/L   Glucose, Bld 110 (H) 65 - 99 mg/dL   BUN 15 6 - 20 mg/dL   Creatinine, Ser 0.88 0.44 - 1.00 mg/dL   Calcium 9.6 8.9 - 10.3 mg/dL   Total Protein 8.3 (H) 6.5 - 8.1 g/dL   Albumin 4.3 3.5 - 5.0 g/dL   AST 22 15 - 41 U/L   ALT 21 14 - 54 U/L   Alkaline Phosphatase 101 38 - 126 U/L   Total Bilirubin 0.4 0.3 - 1.2 mg/dL   GFR calc non Af Amer >60 >60 mL/min   GFR calc Af Amer >60 >60 mL/min    Comment: (NOTE) The eGFR has been calculated using the CKD EPI equation. This calculation has not been validated in all clinical situations. eGFR's persistently <60 mL/min signify possible Chronic Kidney Disease.    Anion gap 10 5 - 15  CBC with Differential     Status: Abnormal   Collection Time: 04/24/17  7:22 PM  Result Value Ref Range   WBC 12.1 (H) 4.0 - 10.5 K/uL   RBC 5.22 (H) 3.87 - 5.11 MIL/uL   Hemoglobin 15.2 (H) 12.0 - 15.0 g/dL   HCT 43.9 36.0 - 46.0 %   MCV 84.1 78.0 - 100.0 fL   MCH 29.1 26.0 - 34.0 pg   MCHC 34.6 30.0 - 36.0 g/dL   RDW 13.4 11.5 - 15.5 %   Platelets 289 150 - 400 K/uL   Neutrophils Relative % 84 %   Neutro Abs 10.1 (H) 1.7 - 7.7 K/uL   Lymphocytes Relative 11 %   Lymphs Abs 1.3 0.7 - 4.0 K/uL   Monocytes Relative 5 %   Monocytes Absolute 0.7 0.1 - 1.0 K/uL   Eosinophils Relative 0 %   Eosinophils Absolute 0.0 0.0 - 0.7 K/uL   Basophils Relative 0 %   Basophils Absolute 0.0 0.0 - 0.1 K/uL  I-stat troponin, ED     Status: None   Collection Time: 04/24/17  7:34 PM  Result Value Ref  Range   Troponin i, poc 0.01 0.00 - 0.08 ng/mL   Comment 3            Comment: Due to the release kinetics of cTnI, a negative result  within the first hours of the onset of symptoms does not rule out myocardial infarction with certainty. If myocardial infarction is still suspected, repeat the test at appropriate intervals.   Urinalysis, Routine w reflex microscopic     Status: Abnormal   Collection Time: 04/24/17 10:49 PM  Result Value Ref Range   Color, Urine YELLOW YELLOW   APPearance CLEAR CLEAR   Specific Gravity, Urine >1.046 (H) 1.005 - 1.030   pH 6.0 5.0 - 8.0   Glucose, UA NEGATIVE NEGATIVE mg/dL   Hgb urine dipstick MODERATE (A) NEGATIVE   Bilirubin Urine NEGATIVE NEGATIVE   Ketones, ur NEGATIVE NEGATIVE mg/dL   Protein, ur NEGATIVE NEGATIVE mg/dL   Nitrite NEGATIVE NEGATIVE   Leukocytes, UA NEGATIVE NEGATIVE   RBC / HPF 6-30 0 - 5 RBC/hpf   WBC, UA 0-5 0 - 5 WBC/hpf   Bacteria, UA NONE SEEN NONE SEEN   Squamous Epithelial / LPF 6-30 (A) NONE SEEN  CBC     Status: None   Collection Time: 04/25/17  4:57 AM  Result Value Ref Range   WBC 7.1 4.0 - 10.5 K/uL   RBC 4.70 3.87 - 5.11 MIL/uL   Hemoglobin 13.6 12.0 - 15.0 g/dL   HCT 40.0 36.0 - 46.0 %   MCV 85.1 78.0 - 100.0 fL   MCH 28.9 26.0 - 34.0 pg   MCHC 34.0 30.0 - 36.0 g/dL   RDW 13.7 11.5 - 15.5 %   Platelets 195 150 - 400 K/uL  Comprehensive metabolic panel     Status: Abnormal   Collection Time: 04/25/17  4:57 AM  Result Value Ref Range   Sodium 139 135 - 145 mmol/L   Potassium 4.2 3.5 - 5.1 mmol/L   Chloride 108 101 - 111 mmol/L   CO2 23 22 - 32 mmol/L   Glucose, Bld 143 (H) 65 - 99 mg/dL   BUN 15 6 - 20 mg/dL   Creatinine, Ser 0.87 0.44 - 1.00 mg/dL   Calcium 8.1 (L) 8.9 - 10.3 mg/dL   Total Protein 6.8 6.5 - 8.1 g/dL   Albumin 3.5 3.5 - 5.0 g/dL   AST 57 (H) 15 - 41 U/L   ALT 59 (H) 14 - 54 U/L   Alkaline Phosphatase 90 38 - 126 U/L   Total Bilirubin 0.7 0.3 - 1.2 mg/dL   GFR calc non Af Amer >60  >60 mL/min   GFR calc Af Amer >60 >60 mL/min    Comment: (NOTE) The eGFR has been calculated using the CKD EPI equation. This calculation has not been validated in all clinical situations. eGFR's persistently <60 mL/min signify possible Chronic Kidney Disease.    Anion gap 8 5 - 15   Ct Abdomen Pelvis W Contrast  Result Date: 04/24/2017 CLINICAL DATA:  Diffuse abdominal pain for 4 hours, accompanied by nausea and vomiting. EXAM: CT ABDOMEN AND PELVIS WITH CONTRAST TECHNIQUE: Multidetector CT imaging of the abdomen and pelvis was performed using the standard protocol following bolus administration of intravenous contrast. CONTRAST:  185m ISOVUE-300 IOPAMIDOL (ISOVUE-300) INJECTION 61% COMPARISON:  08/24/2016 FINDINGS: Lower chest: No acute abnormality. Hepatobiliary: Cholecystectomy. Mild fatty infiltration of the liver without focal lesion. Normal bile ducts. Pancreas: Unremarkable. No pancreatic ductal dilatation or surrounding inflammatory changes. Spleen: Normal in size without focal abnormality. Adrenals/Urinary Tract: Adrenal glands are unremarkable. Kidneys are normal, without renal calculi, focal lesion, or hydronephrosis. Bladder is unremarkable. Stomach/Bowel: Small hiatal hernia. Stomach and small bowel are otherwise unremarkable. Normal appendix. Mild uncomplicated  diverticulosis. No bowel obstruction. No focal inflammation of bowel. Vascular/Lymphatic: No significant vascular findings are present. No enlarged abdominal or pelvic lymph nodes. Reproductive: Status post hysterectomy. No adnexal masses. Other: Midline ventral hernia just above the umbilicus, containing fat or omentum. No acute inflammation or abnormal fluid collection associated with the hernia. Musculoskeletal: No significant skeletal lesion. IMPRESSION: 1. Mild hepatic steatosis. 2. Small hiatal hernia. 3. Uncomplicated colonic diverticulosis. 4. Midline ventral hernia containing fat or omentum. Electronically Signed   By:  Andreas Newport [name]   On: 04/24/2017 21:14   Anti-infectives    None        Assessment/Plan Ventral hernia - 1 day acute onset abdominal pain, nausea, vomiting - Abdominal surgical history: diagnostic laparoscopy x2 early 90's; Tubal ligation 1995;  lap chole ~1996 Dr. Rise Patience; umbilical hernia repair 7262 Dr. Rise Patience; ex lap bilateral salpingectomy/removal ectopic pregnancy 2005 - CT scan shows midline ventral hernia containing fat or omentum - WBC WNL, afebrile  Anxiety/depression  ID - none VTE - SCDs, lovenox FEN - IVF, clear liquids   Plan - Patient with ventral hernia seen on CT scan just above the umbilicus. She is tender in her mid upper/central abdomen, and I am unable to palpate hernia on exam. Unsure if her pain is all coming from the hernia. Will discuss with MD.   Jerrye Beavers, [facility] 04/25/2017, 11:23 AM Pager: [phone] Consults: [phone] Mon-Fri 7:00 am-4:30 pm Sat-Sun 7:00 am-11:30 am

## 2017-04-25 NOTE — H&P (Signed)
History and Physical    [name] XTK:[id number] DOB: 03/30/1966 DOA: 04/24/2017  PCP: Webb Silversmith, NP  Patient coming from: Home  I have personally briefly reviewed patient's old medical records in [place]  Chief Complaint: Abdominal pain  HPI: [name] is a 51 y.o. female with medical history significant of hernia repair with mesh back in the early 2000s.  Patient presents to the ED with c/o sudden onset, gradually worsening, epigastric abdominal pain.  Symptoms onset initially at 7am, worsened around 4pm.  Pain is 10/10, waxing and waning, squeezing quality.  Over epigastric area with radiation to RUQ and LUQ.  Pain is worse with any palpation.  Nothing seems to make pain better.  Having nausea, and some vomiting.   ED Course: CT scan shows small hiatal hernia, shows ventral hernia that is either fat or omentum containing, uncomplicated mild diverticulosis, and no other acute pathology.   Review of Systems: As per HPI otherwise 10 point review of systems negative.   Past Medical History:  Diagnosis Date  . Anxiety   . Depression   . GERD (gastroesophageal reflux disease)   . History of abnormal cervical Pap smear    1991 -- 1995--hx cryotherapy to cervix  . History of colon polyps    2008- BENIGN  . Hydronephrosis, left   . Kidney stones 2016  . PONV (postoperative nausea and vomiting)     Past Surgical History:  Procedure Laterality Date  . ABDOMINAL HYSTERECTOMY    . ANTERIOR AND POSTERIOR REPAIR WITH SACROSPINOUS FIXATION N/A 08/16/2015   Procedure: ANTERIOR COLPORRHAPHY WITH XENOFORM GRAFT AND SACROSPINOUS FIXATION;  Surgeon: Nunzio Cobbs, MD;  Location: [facility];  Service: Gynecology;  Laterality: N/A;  2.5 hours OR time  . BLADDER SUSPENSION N/A 08/16/2015   Procedure: TRANSVAGINAL TAPE (TVT) PROCEDURE exact midurethral sling;  Surgeon: Nunzio Cobbs, MD;  Location: [facility];  Service: Gynecology;  Laterality: N/A;  . COLONOSCOPY W/  POLYPECTOMY  05-30-2007  . CYSTOSCOPY N/A 08/16/2015   Procedure: CYSTOSCOPY;  Surgeon: Nunzio Cobbs, MD;  Location: [facility];  Service: Gynecology;  Laterality: N/A;  . CYSTOSCOPY W/ RETROGRADES Bilateral 06/22/2015   Procedure: CYSTOSCOPY WITH RETROGRADE PYELOGRAM;  Surgeon: Cleon Gustin, MD;  Location: [facility];  Service: Urology;  Laterality: Bilateral;  . CYSTOSCOPY W/ URETERAL STENT PLACEMENT  02/  2000  . CYSTOSCOPY WITH HOLMIUM LASER LITHOTRIPSY Left 05/18/2015   Procedure: CYSTOSCOPY WITH HOLMIUM LASER LITHOTRIPSY;  Surgeon: Alexis Frock, MD;  Location: [facility];  Service: Urology;  Laterality: Left;  . CYSTOSCOPY WITH RETROGRADE PYELOGRAM, URETEROSCOPY AND STENT PLACEMENT Left 06/22/2015   Procedure: CYSTOSCOPY,  LEFT URETEROSCOPY;  Surgeon: Cleon Gustin, MD;  Location: [facility];  Service: Urology;  Laterality: Left;  . CYSTOSCOPY WITH URETEROSCOPY AND STENT PLACEMENT Left 05/18/2015   Procedure: CYSTOSCOPY WITH URETEROSCOPY, STONE MANIPULATION AND STENT PLACEMENT;  Surgeon: Alexis Frock, MD;  Location: [facility];  Service: Urology;  Laterality: Left;  . DIAGNOSTIC LAPAROSCOPY    . DX LAPAROSCOPY  X2  . ESOPHAGOGASTRODUODENOSCOPY  05-09-2007  . EXPLORATORY LAPAROTOMY W/ BILATERAL SALPINGECTOMY AND REMOVAL RIGHT ECTOPIC PREG.  02-23-2004  . EXTRACORPOREAL SHOCK WAVE LITHOTRIPSY  2001  &  2002  . LAPAROSCOPIC CHOLECYSTECTOMY  1999  . SHOULDER ARTHROSCOPY WITH OPEN ROTATOR CUFF REPAIR Right 2012  . TOTAL ABDOMINAL HYSTERECTOMY W/ BILATERAL OOPHORECTOMY AND LYSIS ADHESIONS  09-02-2007  . TUBAL LIGATION Bilateral  2297  . UMBILICAL HERNIA REPAIR  2003  . VAGINAL HYSTERECTOMY N/A 08/18/2015   Procedure: Exam under Anesthesia, Excision Xenform Graft, Removal Bilateral Sacrospinous Ligament sutures;  Surgeon: Nunzio Cobbs, MD;  Location: [facility];  Service: Gynecology;  Laterality: N/A;      reports that she quit smoking about 8 years ago. Her smoking use included Cigarettes. She quit after 15.00 years of use. She has never used smokeless tobacco. She reports that she does not drink alcohol or use drugs.  Allergies  Allergen Reactions  . Desvenlafaxine Other (See Comments)    REACTION: withdrawl  . Iohexol      Code: RASH, Desc: PT STATES BACK IN THE 70S SHE HAD IV DYE REACTION.04/30/07/RM---pt given omnipaque 300 w/out pre meds; no complications 9/89/21 slg, Onset Date: [id number]     Family History  Problem Relation Age of Onset  . Hypertension Mother   . Diabetes Father   . Cancer Paternal Uncle     Pancreas  . Heart attack Maternal Grandmother   . Drug abuse Sister   . Colon cancer Neg Hx   . Esophageal cancer Neg Hx   . Stomach cancer Neg Hx   . Rectal cancer Neg Hx      Prior to Admission medications   Medication Sig Start Date End Date Taking? Authorizing Provider  buPROPion (WELLBUTRIN XL) 300 MG 24 hr tablet Take 1 tablet (300 mg total) by mouth daily. 02/20/17  Yes Jearld Fenton, NP  cholecalciferol (VITAMIN D) 1000 units tablet Take 1,000 Units by mouth daily.   Yes Historical Provider, MD  ibuprofen (ADVIL,MOTRIN) 200 MG tablet Take 400 mg by mouth 2 (two) times daily.   Yes Historical Provider, MD  Multiple Vitamin (MULTIVITAMIN WITH MINERALS) TABS tablet Take 1 tablet by mouth daily.   Yes Historical Provider, MD  Omega-3 Fatty Acids (FISH OIL PO) Take 1 capsule by mouth daily.   Yes Historical Provider, MD    Physical Exam: Vitals:   04/24/17 1841 04/24/17 2048 04/24/17 2257 04/24/17 2329  BP:  (!) 136/56 128/80 128/81  Pulse:  (!) 104 (!) 109 (!) 114  Resp:  20 (!) 23 (!) 22  Temp: 97.8 F (36.6 C)     TempSrc: Oral     SpO2:  92% 91% 94%  Weight:      Height:        Constitutional: NAD, calm, comfortable Eyes: PERRL, lids and conjunctivae normal ENMT: Mucous membranes are moist. Posterior pharynx clear of any exudate or lesions.Normal  dentition.  Neck: normal, supple, no masses, no thyromegaly Respiratory: clear to auscultation bilaterally, no wheezing, no crackles. Normal respiratory effort. No accessory muscle use.  Cardiovascular: Regular rate and rhythm, no murmurs / rubs / gallops. No extremity edema. 2+ pedal pulses. No carotid bruits.  Abdomen: Severe tenderness in epigastric area with guarding to even mild palpation.  no masses palpated. No hepatosplenomegaly. Bowel sounds positive.  Musculoskeletal: no clubbing / cyanosis. No joint deformity upper and lower extremities. Good ROM, no contractures. Normal muscle tone.  Skin: no rashes, lesions, ulcers. No induration Neurologic: CN 2-12 grossly intact. Sensation intact, DTR normal. Strength 5/5 in all 4.  Psychiatric: Normal judgment and insight. Alert and oriented x 3. Normal mood.    Labs on Admission: I have personally reviewed following labs and imaging studies  CBC:  Recent Labs Lab 04/24/17 1922  WBC 12.1*  NEUTROABS 10.1*  HGB 15.2*  HCT 43.9  MCV 84.1  PLT 289  Basic Metabolic Panel:  Recent Labs Lab 04/24/17 1921  NA 139  K 3.7  CL 104  CO2 25  GLUCOSE 110*  BUN 15  CREATININE 0.88  CALCIUM 9.6   GFR: Estimated Creatinine Clearance: 92.3 mL/min (by C-G formula based on SCr of 0.88 mg/dL). Liver Function Tests:  Recent Labs Lab 04/24/17 1921  AST 22  ALT 21  ALKPHOS 101  BILITOT 0.4  PROT 8.3*  ALBUMIN 4.3    Recent Labs Lab 04/24/17 1921  LIPASE 31   No results for input(s): AMMONIA in the last 168 hours. Coagulation Profile: No results for input(s): INR, PROTIME in the last 168 hours. Cardiac Enzymes: No results for input(s): CKTOTAL, CKMB, CKMBINDEX, TROPONINI in the last 168 hours. BNP (last 3 results) No results for input(s): PROBNP in the last 8760 hours. HbA1C: No results for input(s): HGBA1C in the last 72 hours. CBG: No results for input(s): GLUCAP in the last 168 hours. Lipid Profile: No results for  input(s): CHOL, HDL, LDLCALC, TRIG, CHOLHDL, LDLDIRECT in the last 72 hours. Thyroid Function Tests: No results for input(s): TSH, T4TOTAL, FREET4, T3FREE, THYROIDAB in the last 72 hours. Anemia Panel: No results for input(s): VITAMINB12, FOLATE, FERRITIN, TIBC, IRON, RETICCTPCT in the last 72 hours. Urine analysis:    Component Value Date/Time   COLORURINE YELLOW 04/24/2017 2249   APPEARANCEUR CLEAR 04/24/2017 2249   LABSPEC >1.046 (H) 04/24/2017 2249   PHURINE 6.0 04/24/2017 2249   GLUCOSEU NEGATIVE 04/24/2017 2249   HGBUR MODERATE (A) 04/24/2017 2249   HGBUR 1+ 03/30/2009 1518   BILIRUBINUR NEGATIVE 04/24/2017 2249   BILIRUBINUR n 01/23/2017 [place] 04/24/2017 2249   PROTEINUR NEGATIVE 04/24/2017 2249   UROBILINOGEN negative 01/23/2017 1629   UROBILINOGEN 0.2 04/11/2015 1047   NITRITE NEGATIVE 04/24/2017 2249   LEUKOCYTESUR NEGATIVE 04/24/2017 2249    Radiological Exams on Admission: Ct Abdomen Pelvis W Contrast  Result Date: 04/24/2017 CLINICAL DATA:  Diffuse abdominal pain for 4 hours, accompanied by nausea and vomiting. EXAM: CT ABDOMEN AND PELVIS WITH CONTRAST TECHNIQUE: Multidetector CT imaging of the abdomen and pelvis was performed using the standard protocol following bolus administration of intravenous contrast. CONTRAST:  143mL ISOVUE-300 IOPAMIDOL (ISOVUE-300) INJECTION 61% COMPARISON:  08/24/2016 FINDINGS: Lower chest: No acute abnormality. Hepatobiliary: Cholecystectomy. Mild fatty infiltration of the liver without focal lesion. Normal bile ducts. Pancreas: Unremarkable. No pancreatic ductal dilatation or surrounding inflammatory changes. Spleen: Normal in size without focal abnormality. Adrenals/Urinary Tract: Adrenal glands are unremarkable. Kidneys are normal, without renal calculi, focal lesion, or hydronephrosis. Bladder is unremarkable. Stomach/Bowel: Small hiatal hernia. Stomach and small bowel are otherwise unremarkable. Normal appendix. Mild  uncomplicated diverticulosis. No bowel obstruction. No focal inflammation of bowel. Vascular/Lymphatic: No significant vascular findings are present. No enlarged abdominal or pelvic lymph nodes. Reproductive: Status post hysterectomy. No adnexal masses. Other: Midline ventral hernia just above the umbilicus, containing fat or omentum. No acute inflammation or abnormal fluid collection associated with the hernia. Musculoskeletal: No significant skeletal lesion. IMPRESSION: 1. Mild hepatic steatosis. 2. Small hiatal hernia. 3. Uncomplicated colonic diverticulosis. 4. Midline ventral hernia containing fat or omentum. Electronically Signed   By: Andreas Newport [name]   On: 04/24/2017 21:14    EKG: Independently reviewed.  Assessment/Plan Principal Problem:   Abdominal pain, acute, epigastric Active Problems:   Ventral hernia without obstruction or gangrene   Nausea & vomiting    1. Acute epigastric abdominal pain - very painful in epigastric area and just superior to umbilicus  especially to just light touch even.  I am suspicious that this represents symptomatic ventral hernia; however, the hernia doesn't contain any bowel, organs, etc and only contains fat or omentum.  1. See EDP note regarding discussion with Dr. Hassell Done who doesn't feel that this is a surgical emergency. 2. Admission for pain and nausea control 3. IVF 4. zofran PRN 5. Dilaudid PRN, continuous pulse ox with dilaudid 6. If persists, likely needs inpatient evaluation by surgery.  DVT prophylaxis: Lovenox Code Status: Full Family Communication: Family at bedside Disposition Plan: Home after admit Consults called: EDP spoke with Dr. Hassell Done Admission status: Place in [place], [place] [facility] Pager [phone]  If 7AM-7PM, please contact day team taking care of patient [email or web address] Password [email or web address]  04/25/2017, 1:41 AM

## 2017-04-25 NOTE — Progress Notes (Signed)
Rx Brief note:  Lovenox   Rx adjusted Lovenox to 50 mg daily in pt with BMI=37  Thanks Dorrene German 04/25/2017 3:47 AM

## 2017-04-25 NOTE — ED Notes (Signed)
Unable to obtain HIV lab. IV does not pull back blood and pt is difficult blood draw. Lab made aware.

## 2017-04-25 NOTE — Progress Notes (Signed)
Patient seen and examined at bedside, patient admitted after midnight, please see earlier detailed admission note by Etta Quill, DO. Briefly, patient presented with abdominal pain, nausea and vomiting. She was found to have a ventral hernia (fat/omentum). Pain is still persisting however nausea/vomiting somewhat improved. Will re-consult general surgery for evaluation.   Cordelia Poche, MD [facility] 04/25/2017, 10:58 AM Pager: [phone]

## 2017-04-26 ENCOUNTER — Observation Stay (HOSPITAL_COMMUNITY): Payer: BLUE CROSS/BLUE SHIELD

## 2017-04-26 DIAGNOSIS — R1013 Epigastric pain: Secondary | ICD-10-CM | POA: Diagnosis not present

## 2017-04-26 DIAGNOSIS — R509 Fever, unspecified: Secondary | ICD-10-CM | POA: Diagnosis not present

## 2017-04-26 DIAGNOSIS — D72829 Elevated white blood cell count, unspecified: Secondary | ICD-10-CM

## 2017-04-26 DIAGNOSIS — R112 Nausea with vomiting, unspecified: Secondary | ICD-10-CM | POA: Diagnosis not present

## 2017-04-26 DIAGNOSIS — K5730 Diverticulosis of large intestine without perforation or abscess without bleeding: Secondary | ICD-10-CM

## 2017-04-26 DIAGNOSIS — R945 Abnormal results of liver function studies: Secondary | ICD-10-CM

## 2017-04-26 DIAGNOSIS — R7989 Other specified abnormal findings of blood chemistry: Secondary | ICD-10-CM

## 2017-04-26 DIAGNOSIS — K449 Diaphragmatic hernia without obstruction or gangrene: Secondary | ICD-10-CM

## 2017-04-26 DIAGNOSIS — K429 Umbilical hernia without obstruction or gangrene: Secondary | ICD-10-CM | POA: Diagnosis not present

## 2017-04-26 LAB — COMPREHENSIVE METABOLIC PANEL
ALT: 158 U/L — ABNORMAL HIGH (ref 14–54)
AST: 147 U/L — ABNORMAL HIGH (ref 15–41)
Albumin: 3 g/dL — ABNORMAL LOW (ref 3.5–5.0)
Alkaline Phosphatase: 146 U/L — ABNORMAL HIGH (ref 38–126)
Anion gap: 6 (ref 5–15)
BUN: 8 mg/dL (ref 6–20)
CO2: 22 mmol/L (ref 22–32)
Calcium: 7.6 mg/dL — ABNORMAL LOW (ref 8.9–10.3)
Chloride: 108 mmol/L (ref 101–111)
Creatinine, Ser: 0.75 mg/dL (ref 0.44–1.00)
GFR calc Af Amer: >60 mL/min (ref >60)
GFR calc non Af Amer: >60 mL/min (ref >60)
Glucose, Bld: 107 mg/dL — ABNORMAL HIGH (ref 65–99)
Potassium: 3.6 mmol/L (ref 3.5–5.1)
Sodium: 136 mmol/L (ref 135–145)
Total Bilirubin: 0.7 mg/dL (ref 0.3–1.2)
Total Protein: 6.2 g/dL — ABNORMAL LOW (ref 6.5–8.1)

## 2017-04-26 MED ORDER — SODIUM CHLORIDE 0.9 % IV BOLUS (SEPSIS)
500.0000 mL | Freq: Once | INTRAVENOUS | Status: AC
  Administered 2017-04-26: 500 mL via INTRAVENOUS

## 2017-04-26 MED ORDER — LORAZEPAM 2 MG/ML IJ SOLN
1.0000 mg | Freq: Once | INTRAMUSCULAR | Status: AC
  Administered 2017-04-26: 1 mg via INTRAVENOUS
  Filled 2017-04-26: qty 1

## 2017-04-26 MED ORDER — PIPERACILLIN-TAZOBACTAM 3.375 G IVPB
3.3750 g | Freq: Three times a day (TID) | INTRAVENOUS | Status: DC
  Administered 2017-04-26 – 2017-04-27 (×4): 3.375 g via INTRAVENOUS
  Filled 2017-04-26 (×4): qty 50

## 2017-04-26 MED ORDER — PIPERACILLIN-TAZOBACTAM 3.375 G IVPB 30 MIN
3.3750 g | Freq: Once | INTRAVENOUS | Status: AC
  Administered 2017-04-26: 3.375 g via INTRAVENOUS
  Filled 2017-04-26: qty 50

## 2017-04-26 MED ORDER — PANTOPRAZOLE SODIUM 40 MG IV SOLR
40.0000 mg | Freq: Two times a day (BID) | INTRAVENOUS | Status: DC
  Administered 2017-04-26 – 2017-04-28 (×5): 40 mg via INTRAVENOUS
  Filled 2017-04-26 (×5): qty 40

## 2017-04-26 MED ORDER — MORPHINE SULFATE (PF) 4 MG/ML IV SOLN
2.0000 mg | INTRAVENOUS | Status: DC | PRN
  Administered 2017-04-26 – 2017-04-27 (×2): 2 mg via INTRAVENOUS
  Filled 2017-04-26 (×2): qty 1

## 2017-04-26 MED ORDER — ACETAMINOPHEN 325 MG PO TABS
650.0000 mg | ORAL_TABLET | ORAL | Status: DC | PRN
  Administered 2017-04-26: 650 mg via ORAL
  Filled 2017-04-26: qty 2

## 2017-04-26 NOTE — Progress Notes (Signed)
Assessment Principal Problem:   Abdominal pain, acute, epigastric-etiology is unclear; LFTs rising. Active Problems:   Chronic, small recurrent umbilical hernia containing fat-does not appear to be the source of her symptoms.   Nausea & vomiting-etiology is unclear   Plan:  Spoke with Dr. Lonny Prude who has called GI.  No acute surgical issues.  We will be available if needed.   LOS: 0 days        Chief Complaint/Subjective: Continued upper epigastric pain and nausea.  Coughing and has a headache.  Objective: Vital signs in last 24 hours: Temp:  [98 F (36.7 C)-101.2 F (38.4 C)] 98.9 F (37.2 C) (05/04 0652) Pulse Rate:  [84-102] 102 (05/04 0417) Resp:  [18-20] 18 (05/04 0417) BP: (106-139)/(59-78) 139/78 (05/04 0417) SpO2:  [94 %-96 %] 94 % (05/04 0417) Last BM Date: 04/24/17  Intake/Output from previous day: 05/03 0701 - 05/04 0700 In: 3503.8 [P.O.:360; I.V.:3143.8] Out: -  Intake/Output this shift: No intake/output data recorded.  PE: General- In NAD.  Awake and alert. Abdomen-soft, tender in upper epigastric area with no guarding, no periumbilical or umbilical tenderness  Lab Results:   Recent Labs  04/24/17 1922 04/25/17 0457  WBC 12.1* 7.1  HGB 15.2* 13.6  HCT 43.9 40.0  PLT 289 195   BMET  Recent Labs  04/25/17 0457 04/26/17 0438  NA 139 136  K 4.2 3.6  CL 108 108  CO2 23 22  GLUCOSE 143* 107*  BUN 15 8  CREATININE 0.87 0.75  CALCIUM 8.1* 7.6*   PT/INR No results for input(s): LABPROT, INR in the last 72 hours. Comprehensive Metabolic Panel:    Component Value Date/Time   NA 136 04/26/2017 0438   NA 139 04/25/2017 0457   K 3.6 04/26/2017 0438   K 4.2 04/25/2017 0457   CL 108 04/26/2017 0438   CL 108 04/25/2017 0457   CO2 22 04/26/2017 0438   CO2 23 04/25/2017 0457   BUN 8 04/26/2017 0438   BUN 15 04/25/2017 0457   CREATININE 0.75 04/26/2017 0438   CREATININE 0.87 04/25/2017 0457   GLUCOSE 107 (H) 04/26/2017 0438   GLUCOSE 143 (H)  04/25/2017 0457   CALCIUM 7.6 (L) 04/26/2017 0438   CALCIUM 8.1 (L) 04/25/2017 0457   AST 147 (H) 04/26/2017 0438   AST 57 (H) 04/25/2017 0457   ALT 158 (H) 04/26/2017 0438   ALT 59 (H) 04/25/2017 0457   ALKPHOS 146 (H) 04/26/2017 0438   ALKPHOS 90 04/25/2017 0457   BILITOT 0.7 04/26/2017 0438   BILITOT 0.7 04/25/2017 0457   PROT 6.2 (L) 04/26/2017 0438   PROT 6.8 04/25/2017 0457   ALBUMIN 3.0 (L) 04/26/2017 0438   ALBUMIN 3.5 04/25/2017 0457     Studies/Results: Ct Abdomen Pelvis W Contrast  Result Date: 04/24/2017 CLINICAL DATA:  Diffuse abdominal pain for 4 hours, accompanied by nausea and vomiting. EXAM: CT ABDOMEN AND PELVIS WITH CONTRAST TECHNIQUE: Multidetector CT imaging of the abdomen and pelvis was performed using the standard protocol following bolus administration of intravenous contrast. CONTRAST:  127mL ISOVUE-300 IOPAMIDOL (ISOVUE-300) INJECTION 61% COMPARISON:  08/24/2016 FINDINGS: Lower chest: No acute abnormality. Hepatobiliary: Cholecystectomy. Mild fatty infiltration of the liver without focal lesion. Normal bile ducts. Pancreas: Unremarkable. No pancreatic ductal dilatation or surrounding inflammatory changes. Spleen: Normal in size without focal abnormality. Adrenals/Urinary Tract: Adrenal glands are unremarkable. Kidneys are normal, without renal calculi, focal lesion, or hydronephrosis. Bladder is unremarkable. Stomach/Bowel: Small hiatal hernia. Stomach and small bowel are otherwise unremarkable. Normal  appendix. Mild uncomplicated diverticulosis. No bowel obstruction. No focal inflammation of bowel. Vascular/Lymphatic: No significant vascular findings are present. No enlarged abdominal or pelvic lymph nodes. Reproductive: Status post hysterectomy. No adnexal masses. Other: Midline ventral hernia just above the umbilicus, containing fat or omentum. No acute inflammation or abnormal fluid collection associated with the hernia. Musculoskeletal: No significant skeletal  lesion. IMPRESSION: 1. Mild hepatic steatosis. 2. Small hiatal hernia. 3. Uncomplicated colonic diverticulosis. 4. Midline ventral hernia containing fat or omentum. Electronically Signed   By: Andreas Newport [name]   On: 04/24/2017 21:14   Dg Chest Port 1 View  Result Date: 04/26/2017 CLINICAL DATA:  Fever EXAM: PORTABLE CHEST 1 VIEW COMPARISON:  February 22, 2007 FINDINGS: There is no edema or consolidation. Heart is mildly prominent with pulmonary vascularity within normal limits. No adenopathy. No bone lesions. IMPRESSION: Mild cardiac prominence.  No edema or consolidation. Electronically Signed   By: Lowella Grip III [name]   On: 04/26/2017 07:11    Anti-infectives: Anti-infectives    Start     Dose/Rate Route Frequency Ordered Stop   04/26/17 1600  piperacillin-tazobactam (ZOSYN) IVPB 3.375 g     3.375 g 12.5 mL/hr over 240 Minutes Intravenous Every 8 hours 04/26/17 0745     04/26/17 0800  piperacillin-tazobactam (ZOSYN) IVPB 3.375 g     3.375 g 100 mL/hr over 30 Minutes Intravenous  Once 04/26/17 0745         Trenice Mesa J 04/26/2017

## 2017-04-26 NOTE — Progress Notes (Addendum)
Pharmacy Antibiotic Follow-up Note  [name] is a 51 y.o. year-old female admitted on 04/24/2017.  The patient is currently on day 1 of Zosyn for Intra-abdominal infection.  Renal function wnl, Pharmacy will sign off protocol at this time.  Will follow renal function and adjust as needed.  Assessment/Plan: Zosyn 3.375g IV q8h (4 hour infusion).  Temp (24hrs), Avg:99.4 F (37.4 C), Min:98 F (36.7 C), Max:101.2 F (38.4 C)   Recent Labs Lab 04/24/17 1922 04/25/17 0457  WBC 12.1* 7.1    Recent Labs Lab 04/24/17 1921 04/25/17 0457 04/26/17 0438  CREATININE 0.88 0.87 0.75   Estimated Creatinine Clearance: 102.4 mL/min (by C-G formula based on SCr of 0.75 mg/dL).    Allergies  Allergen Reactions  . Desvenlafaxine Other (See Comments)    REACTION: withdrawl  . Iohexol      Code: RASH, Desc: PT STATES BACK IN THE 70S SHE HAD IV DYE REACTION.04/30/07/RM---pt given omnipaque 300 w/out pre meds; no complications 8/47/20 slg, Onset Date: [id number]    Antimicrobials this admission: 5/4 Zosyn >>   Microbiology results: 5/4 BCx: sent 5/2 U/A unremarkable   Thank you for allowing pharmacy to be a part of this patient's care.  Minda Ditto PharmD Pager [phone] 04/26/2017, 7:52 AM

## 2017-04-26 NOTE — Consult Note (Signed)
Consultation  Referring Provider:   Dr. Lonny Prude   Primary Care Physician:  Webb Silversmith, NP Primary Gastroenterologist:  Dr. Ardis Hughs       Reason for Consultation: Elevated LFT's, Epigastric pain, Nausea and Vomiting             HPI:   Carnelia Oscar is a 51 y.o.Caucasian female the past medical history of anxiety, depression, GERD and others listed below, who presented to the ED on the 04/25/17 with a complaint of sudden onset, gradually worsening epigastric abdominal pain with nausea and vomiting.   Patient initially had a CT scan which showed a small ventral hernia which was thought to be either fat or omentum containing and possibly the source of patient's pain. Initially surgery was consulted but did not feel as though the hernia was related to patient's symptoms. Since then we have been consulted due to a gradual rise in patient's LFTs and continued symptoms of epigastric pain and nausea.   Today, the patient tells me that on Wednesday of this past week, 04/24/17, she developed an epigastric burning pain in the morning and had 2 "softer bowel movements", she proceeded to work and around 4:00 started with an intense, what she rates as an almost "15/10" pain, "twisting and stabbing" pain in her epigastrium. She got home and this pain continued and she became severely nauseous. She then came to the ER and started violently vomiting per her report "multiple times". Since she has received medicine she has remained somewhat nauseous, worse after taking sips of liquids. Patient also describes running a fever overnight. She has not had a bowel movement since Wednesday and typically has at least 1 a day. Her epigastric pain "waxes and wanes", and is not any worse when she tries to eat her clear liquid diet. She denies any heartburn or reflux or history of liver disease.   Patient denies dysuria, family history of liver disease, hepatitis, blood transfusions or IV drug use she also denies any use of  supplements.     ED Course: CT scan shows small hiatal hernia, shows ventral hernia that is either fat or omentum containing, uncomplicated mild diverticulosis, and no other acute pathology.  GI history: 04/18/15: [facility], office visit-did after ER visit for acute epigastric pain and nausea, etiology was unclear, patient start on Protonix 40 mg by mouth twice a day 2 weeks, Rubinul forte 2 mg by mouth twice a day was added, Ct was ordered and EGD was to be considered 05/30/07: Dr. Ardis Hughs, colonoscopy-impression: Few small hyperplastic appearing polyps, internal hemorrhoids, repeat was recommended 10 years 05/09/07: Dr. Ardis Hughs, EGD-impression: Essentially normal exam except for more than usual amount of small bowel spasm, not may be contributing to her discomfort, Levbid 0.375 mg by mouth twice daily was added  Past Medical History:  Diagnosis Date  . Anxiety   . Depression   . GERD (gastroesophageal reflux disease)   . History of abnormal cervical Pap smear    1991 -- 1995--hx cryotherapy to cervix  . History of colon polyps    2008- BENIGN  . Hydronephrosis, left   . Kidney stones 2016  . PONV (postoperative nausea and vomiting)     Past Surgical History:  Procedure Laterality Date  . ABDOMINAL HYSTERECTOMY    . ANTERIOR AND POSTERIOR REPAIR WITH SACROSPINOUS FIXATION N/A 08/16/2015   Procedure: ANTERIOR COLPORRHAPHY WITH XENOFORM GRAFT AND SACROSPINOUS FIXATION;  Surgeon: Nunzio Cobbs, MD;  Location: [facility];  Service:  Gynecology;  Laterality: N/A;  2.5 hours OR time  . BLADDER SUSPENSION N/A 08/16/2015   Procedure: TRANSVAGINAL TAPE (TVT) PROCEDURE exact midurethral sling;  Surgeon: Nunzio Cobbs, MD;  Location: [facility];  Service: Gynecology;  Laterality: N/A;  . COLONOSCOPY W/ POLYPECTOMY  05-30-2007  . CYSTOSCOPY N/A 08/16/2015   Procedure: CYSTOSCOPY;  Surgeon: Nunzio Cobbs, MD;  Location: [facility];  Service: Gynecology;  Laterality: N/A;  .  CYSTOSCOPY W/ RETROGRADES Bilateral 06/22/2015   Procedure: CYSTOSCOPY WITH RETROGRADE PYELOGRAM;  Surgeon: Cleon Gustin, MD;  Location: [facility];  Service: Urology;  Laterality: Bilateral;  . CYSTOSCOPY W/ URETERAL STENT PLACEMENT  02/  2000  . CYSTOSCOPY WITH HOLMIUM LASER LITHOTRIPSY Left 05/18/2015   Procedure: CYSTOSCOPY WITH HOLMIUM LASER LITHOTRIPSY;  Surgeon: Alexis Frock, MD;  Location: [facility];  Service: Urology;  Laterality: Left;  . CYSTOSCOPY WITH RETROGRADE PYELOGRAM, URETEROSCOPY AND STENT PLACEMENT Left 06/22/2015   Procedure: CYSTOSCOPY,  LEFT URETEROSCOPY;  Surgeon: Cleon Gustin, MD;  Location: [facility];  Service: Urology;  Laterality: Left;  . CYSTOSCOPY WITH URETEROSCOPY AND STENT PLACEMENT Left 05/18/2015   Procedure: CYSTOSCOPY WITH URETEROSCOPY, STONE MANIPULATION AND STENT PLACEMENT;  Surgeon: Alexis Frock, MD;  Location: [facility];  Service: Urology;  Laterality: Left;  . DIAGNOSTIC LAPAROSCOPY    . DX LAPAROSCOPY  X2  . ESOPHAGOGASTRODUODENOSCOPY  05-09-2007  . EXPLORATORY LAPAROTOMY W/ BILATERAL SALPINGECTOMY AND REMOVAL RIGHT ECTOPIC PREG.  02-23-2004  . EXTRACORPOREAL SHOCK WAVE LITHOTRIPSY  2001  &  2002  . LAPAROSCOPIC CHOLECYSTECTOMY  1999  . SHOULDER ARTHROSCOPY WITH OPEN ROTATOR CUFF REPAIR Right 2012  . TOTAL ABDOMINAL HYSTERECTOMY W/ BILATERAL OOPHORECTOMY AND LYSIS ADHESIONS  09-02-2007  . TUBAL LIGATION Bilateral 1995  . UMBILICAL HERNIA REPAIR  2003  . VAGINAL HYSTERECTOMY N/A 08/18/2015   Procedure: Exam under Anesthesia, Excision Xenform Graft, Removal Bilateral Sacrospinous Ligament sutures;  Surgeon: Nunzio Cobbs, MD;  Location: [facility];  Service: Gynecology;  Laterality: N/A;    Family History  Problem Relation Age of Onset  . Hypertension Mother   . Diabetes Father   . Cancer Paternal Uncle     Pancreas  . Heart attack Maternal Grandmother   . Drug  abuse Sister   . Colon cancer Neg Hx   . Esophageal cancer Neg Hx   . Stomach cancer Neg Hx   . Rectal cancer Neg Hx     Social History  Substance Use Topics  . Smoking status: Former Smoker    Years: 15.00    Types: Cigarettes    Quit date: 05/16/2008  . Smokeless tobacco: Never Used  . Alcohol use No    Prior to Admission medications   Medication Sig Start Date End Date Taking? Authorizing Provider  buPROPion (WELLBUTRIN XL) 300 MG 24 hr tablet Take 1 tablet (300 mg total) by mouth daily. 02/20/17  Yes Jearld Fenton, NP  cholecalciferol (VITAMIN D) 1000 units tablet Take 1,000 Units by mouth daily.   Yes Historical Provider, MD  ibuprofen (ADVIL,MOTRIN) 200 MG tablet Take 400 mg by mouth 2 (two) times daily.   Yes Historical Provider, MD  Multiple Vitamin (MULTIVITAMIN WITH MINERALS) TABS tablet Take 1 tablet by mouth daily.   Yes Historical Provider, MD  Omega-3 Fatty Acids (FISH OIL PO) Take 1 capsule by mouth daily.   Yes Historical Provider, MD    Current Facility-Administered Medications  Medication Dose  Route Frequency Provider Last Rate Last Dose  . 0.9 %  sodium chloride infusion   Intravenous Continuous Etta Quill, DO 125 mL/hr at 04/26/17 0532    . acetaminophen (TYLENOL) tablet 650 mg  650 mg Oral Q4H PRN Jeryl Columbia, NP   650 mg at 04/26/17 0842  . buPROPion (WELLBUTRIN XL) 24 hr tablet 300 mg  300 mg Oral Daily Etta Quill, DO   300 mg at 04/26/17 0955  . cyclobenzaprine (FLEXERIL) tablet 10 mg  10 mg Oral TID PRN Jeryl Columbia, NP   10 mg at 04/25/17 2001  . enoxaparin (LOVENOX) injection 50 mg  50 mg Subcutaneous Q24H Etta Quill, DO   50 mg at 04/26/17 0956  . ondansetron (ZOFRAN) injection 4 mg  4 mg Intravenous Q6H PRN Etta Quill, DO   4 mg at 04/26/17 1004  . oxyCODONE-acetaminophen (PERCOCET/ROXICET) 5-325 MG per tablet 1-2 tablet  1-2 tablet Oral Q4H PRN Mariel Aloe, MD   2 tablet at 04/26/17 0341  . piperacillin-tazobactam  (ZOSYN) IVPB 3.375 g  3.375 g Intravenous Q8H Terri Domenic Schwab, RPH      . promethazine (PHENERGAN) tablet 12.5 mg  12.5 mg Oral Q6H PRN Mariel Aloe, MD   12.5 mg at 04/26/17 1062    Allergies as of 04/24/2017 - Review Complete 04/24/2017  Allergen Reaction Noted  . Desvenlafaxine Other (See Comments) 05/03/2009  . Iohexol  04/30/2007     Review of Systems:    Constitutional: No weight loss, fever or chills Skin: No rash  Cardiovascular: No chest pain Respiratory: No SOB Gastrointestinal: See HPI and otherwise negative Genitourinary: No dysuria or change in urinary frequency Neurological: No headache, dizziness or syncope Musculoskeletal: No new muscle or joint pain Hematologic: No bleeding or bruising Psychiatric: Positive for anxiety   Physical Exam:  Vital signs in last 24 hours: Temp:  [98 F (36.7 C)-101.2 F (38.4 C)] 98.9 F (37.2 C) (05/04 0652) Pulse Rate:  [84-102] 102 (05/04 0417) Resp:  [18-20] 18 (05/04 0417) BP: (106-139)/(59-78) 139/78 (05/04 0417) SpO2:  [94 %-96 %] 94 % (05/04 0417) Last BM Date: 04/24/17 General: Obese Caucasian female appears to be in NAD, Well developed, Well nourished, alert and cooperative Head:  Normocephalic and atraumatic. Eyes:   PEERL, EOMI. No icterus. Conjunctiva pink. Ears:  Normal auditory acuity. Neck:  Supple Throat: Oral cavity and pharynx without inflammation, swelling or lesion.  Lungs: Respirations even and unlabored. Lungs clear to auscultation bilaterally.   No wheezes, crackles, or rhonchi.  Heart: Normal S1, S2. No MRG. Regular rate and rhythm. No peripheral edema, cyanosis or pallor.  Abdomen:  Soft, nondistended, Moderate epigastric ttp No rebound or guarding. Normal bowel sounds. No appreciable masses or hepatomegaly. Rectal:  Not performed.  Msk:  Symmetrical without gross deformities.   Extremities:  Without edema, no deformity or joint abnormality. Neurologic:  Alert and  oriented x4;  grossly normal  neurologically.  Skin:   Dry and intact without significant lesions or rashes. Psychiatric: Demonstrates good judgement and reason without abnormal affect or behaviors.   LAB RESULTS:  Recent Labs  04/24/17 1922 04/25/17 0457  WBC 12.1* 7.1  HGB 15.2* 13.6  HCT 43.9 40.0  PLT 289 195   BMET  Recent Labs  04/24/17 1921 04/25/17 0457 04/26/17 0438  NA 139 139 136  K 3.7 4.2 3.6  CL 104 108 108  CO2 25 23 22   GLUCOSE 110* 143* 107*  BUN 15  15 8  CREATININE 0.88 0.87 0.75  CALCIUM 9.6 8.1* 7.6*   LFT  Results for ESTEFANIE, CORNFORTH (MRN [id number]) as of 04/26/2017 10:54  Ref. Range 04/24/2017 19:21 04/24/2017 19:22 04/24/2017 19:34 04/25/2017 04:57 04/26/2017 04:38  Alkaline Phosphatase Latest Ref Range: 38 - 126 U/L 101   90 146 (H)  Albumin Latest Ref Range: 3.5 - 5.0 g/dL 4.3   3.5 3.0 (L)  Lipase Latest Ref Range: 11 - 51 U/L 31      AST Latest Ref Range: 15 - 41 U/L 22   57 (H) 147 (H)  ALT Latest Ref Range: 14 - 54 U/L 21   59 (H) 158 (H)  Total Protein Latest Ref Range: 6.5 - 8.1 g/dL 8.3 (H)   6.8 6.2 (L)  Total Bilirubin Latest Ref Range: 0.3 - 1.2 mg/dL 0.4   0.7 0.7    STUDIES: Ct Abdomen Pelvis W Contrast  Result Date: 04/24/2017 CLINICAL DATA:  Diffuse abdominal pain for 4 hours, accompanied by nausea and vomiting. EXAM: CT ABDOMEN AND PELVIS WITH CONTRAST TECHNIQUE: Multidetector CT imaging of the abdomen and pelvis was performed using the standard protocol following bolus administration of intravenous contrast. CONTRAST:  158m ISOVUE-300 IOPAMIDOL (ISOVUE-300) INJECTION 61% COMPARISON:  08/24/2016 FINDINGS: Lower chest: No acute abnormality. Hepatobiliary: Cholecystectomy. Mild fatty infiltration of the liver without focal lesion. Normal bile ducts. Pancreas: Unremarkable. No pancreatic ductal dilatation or surrounding inflammatory changes. Spleen: Normal in size without focal abnormality. Adrenals/Urinary Tract: Adrenal glands are unremarkable. Kidneys are normal, without  renal calculi, focal lesion, or hydronephrosis. Bladder is unremarkable. Stomach/Bowel: Small hiatal hernia. Stomach and small bowel are otherwise unremarkable. Normal appendix. Mild uncomplicated diverticulosis. No bowel obstruction. No focal inflammation of bowel. Vascular/Lymphatic: No significant vascular findings are present. No enlarged abdominal or pelvic lymph nodes. Reproductive: Status post hysterectomy. No adnexal masses. Other: Midline ventral hernia just above the umbilicus, containing fat or omentum. No acute inflammation or abnormal fluid collection associated with the hernia. Musculoskeletal: No significant skeletal lesion. IMPRESSION: 1. Mild hepatic steatosis. 2. Small hiatal hernia. 3. Uncomplicated colonic diverticulosis. 4. Midline ventral hernia containing fat or omentum. Electronically Signed   By: DAndreas NewportM.D.   On: 04/24/2017 21:14   Dg Chest Port 1 View  Result Date: 04/26/2017 CLINICAL DATA:  Fever EXAM: PORTABLE CHEST 1 VIEW COMPARISON:  February 22, 2007 FINDINGS: There is no edema or consolidation. Heart is mildly prominent with pulmonary vascularity within normal limits. No adenopathy. No bone lesions. IMPRESSION: Mild cardiac prominence.  No edema or consolidation. Electronically Signed   By: WLowella GripIII [name]   On: 04/26/2017 07:11   PREVIOUS ENDOSCOPIES:            See HPI   Impression / Plan:   Impression: 1. Epigastric Pain: started 2 days ago, some better now, waxing and waning with nausea, causing decreased appetite, Per chart review this appears somewhat chronic for the patient, uncertain etiology, though now LFTs are rising?; Consider gastritis versus functional dyspepsia versus other 2. Elevated LFT's: 04/24/17: AST-22, ALT-21, Alk phos-101; 04/25/17: AST-57, ALT-59, Alk phos-90; 04/26/17: AST-147, ALT-158, Alk phos-146 3. Ventral hernia: surgery has evaluated and r/o as etiology of pain   Plan: 1. Continue prn antiemetics 2. May consider EGD in the  future pending Dr. PVena Ruarecommendations due to continued epigastric pain, though this could possibly be done as outpatient pending patient's course 4. Ordering MRCP today 5. Added labs including viral hepatitis panel, ANA, ASMA and IgG 6. Continue supportive  measures 7. Added Pantoprazole 18m BID IV 8. Please await any further recs from Dr. PHilarie Fredrickson *of note, patient scheduled for screening colonoscopy with Dr. JArdis Hughson 05/31/17*  Thank you for your kind consultation, we will continue to follow.  JLavone NianLemmon  04/26/2017, 10:42 AM Pager #: [phone]

## 2017-04-26 NOTE — Progress Notes (Signed)
PROGRESS NOTE    [name]  OMV:[id number] DOB: 1966-12-09 DOA: 04/24/2017 PCP: Webb Silversmith, NP   Brief Narrative: [name] is a 51 y.o. female with a history of cholecystectomy, hernia repair with mesh, hydronephrosis secondary to nephrolithiasis, anxiety, depression, GERD. She presents with epigastric pain initially thought to be secondary to ventral hernia but now with elevating liver enzymes and fever   Assessment & Plan:   Principal Problem:   Abdominal pain, acute, epigastric Active Problems:   Ventral hernia without obstruction or gangrene   Nausea & vomiting   Epigastric pain Possibly secondary to biliary pathology. Still with nausea without vomiting. Elevating AST/ALT and alkaline phosphatase. Bilirubin stable and within normal limits. Fever overnight. -GI recommendations -Zosyn secondary to fevers and possible biliary pathology -Blood cultures obtained (5/4) and are pending  Ventral hernia Evaluated by general surgery. Can follow-up outpatient  Nausea and vomiting No vomiting. Nausea persistent but improved -continue antiemetics  Headache -recommend using already prescribed Percocet. Will avoid NSAIDs.    DVT prophylaxis: Lovenox Code Status: Full code Family Communication: Husband at bedside Disposition Plan: Discharge pending GI workup   Consultants:   General surgery (5/3)  [facility] (5/4)  Procedures:   None  Antimicrobials:  Zosyn (5/4>>    Subjective: Patient reports persistent pain that is being controlled. She also reports a frontal headache that is not relieved with Tylenol  Objective: Vitals:   04/25/17 2055 04/26/17 0417 04/26/17 0508 04/26/17 0652  BP: 106/61 139/78    Pulse: 86 (!) 102    Resp: 18 18    Temp: 98.9 F (37.2 C) (!) 101.2 F (38.4 C) 100.2 F (37.9 C) 98.9 F (37.2 C)  TempSrc: Oral Oral Oral Oral  SpO2: 96% 94%    Weight:      Height:        Intake/Output Summary (Last 24 hours) at 04/26/17  1206 Last data filed at 04/26/17 1030  Gross per 24 hour  Intake          3803.75 ml  Output                2 ml  Net          3801.75 ml   Filed Weights   04/24/17 1818 04/25/17 0406  Weight: 104.3 kg (230 lb) 106.1 kg (234 lb)    Examination:  General exam: Appears calm and comfortable  Respiratory system: Clear to auscultation. Respiratory effort normal. Cardiovascular system: S1 & S2 heard, RRR. No murmurs. Gastrointestinal system: Abdomen is nondistended, soft and nontender. No organomegaly or masses felt. Normal bowel sounds heard. Central nervous system: Alert and oriented. No focal neurological deficits. Extremities: No edema. No calf tenderness Skin: No cyanosis. No rashes Psychiatry: Judgement and insight appear normal. Mood & affect appropriate.     Data Reviewed: I have personally reviewed following labs and imaging studies  CBC:  Recent Labs Lab 04/24/17 1922 04/25/17 0457  WBC 12.1* 7.1  NEUTROABS 10.1*  --   HGB 15.2* 13.6  HCT 43.9 40.0  MCV 84.1 85.1  PLT 289 628   Basic Metabolic Panel:  Recent Labs Lab 04/24/17 1921 04/25/17 0457 04/26/17 0438  NA 139 139 136  K 3.7 4.2 3.6  CL 104 108 108  CO2 25 23 22   GLUCOSE 110* 143* 107*  BUN 15 15 8   CREATININE 0.88 0.87 0.75  CALCIUM 9.6 8.1* 7.6*   GFR: Estimated Creatinine Clearance: 102.4 mL/min (by C-G formula based on SCr of 0.75  mg/dL). Liver Function Tests:  Recent Labs Lab 04/24/17 1921 04/25/17 0457 04/26/17 0438  AST 22 57* 147*  ALT 21 59* 158*  ALKPHOS 101 90 146*  BILITOT 0.4 0.7 0.7  PROT 8.3* 6.8 6.2*  ALBUMIN 4.3 3.5 3.0*    Recent Labs Lab 04/24/17 1921  LIPASE 31   No results for input(s): AMMONIA in the last 168 hours. Coagulation Profile: No results for input(s): INR, PROTIME in the last 168 hours. Cardiac Enzymes: No results for input(s): CKTOTAL, CKMB, CKMBINDEX, TROPONINI in the last 168 hours. BNP (last 3 results) No results for input(s): PROBNP in  the last 8760 hours. HbA1C: No results for input(s): HGBA1C in the last 72 hours. CBG: No results for input(s): GLUCAP in the last 168 hours. Lipid Profile: No results for input(s): CHOL, HDL, LDLCALC, TRIG, CHOLHDL, LDLDIRECT in the last 72 hours. Thyroid Function Tests: No results for input(s): TSH, T4TOTAL, FREET4, T3FREE, THYROIDAB in the last 72 hours. Anemia Panel: No results for input(s): VITAMINB12, FOLATE, FERRITIN, TIBC, IRON, RETICCTPCT in the last 72 hours. Sepsis Labs: No results for input(s): PROCALCITON, LATICACIDVEN in the last 168 hours.  No results found for this or any previous visit (from the past 240 hour(s)).       Radiology Studies: Ct Abdomen Pelvis W Contrast  Result Date: 04/24/2017 CLINICAL DATA:  Diffuse abdominal pain for 4 hours, accompanied by nausea and vomiting. EXAM: CT ABDOMEN AND PELVIS WITH CONTRAST TECHNIQUE: Multidetector CT imaging of the abdomen and pelvis was performed using the standard protocol following bolus administration of intravenous contrast. CONTRAST:  125mL ISOVUE-300 IOPAMIDOL (ISOVUE-300) INJECTION 61% COMPARISON:  08/24/2016 FINDINGS: Lower chest: No acute abnormality. Hepatobiliary: Cholecystectomy. Mild fatty infiltration of the liver without focal lesion. Normal bile ducts. Pancreas: Unremarkable. No pancreatic ductal dilatation or surrounding inflammatory changes. Spleen: Normal in size without focal abnormality. Adrenals/Urinary Tract: Adrenal glands are unremarkable. Kidneys are normal, without renal calculi, focal lesion, or hydronephrosis. Bladder is unremarkable. Stomach/Bowel: Small hiatal hernia. Stomach and small bowel are otherwise unremarkable. Normal appendix. Mild uncomplicated diverticulosis. No bowel obstruction. No focal inflammation of bowel. Vascular/Lymphatic: No significant vascular findings are present. No enlarged abdominal or pelvic lymph nodes. Reproductive: Status post hysterectomy. No adnexal masses. Other:  Midline ventral hernia just above the umbilicus, containing fat or omentum. No acute inflammation or abnormal fluid collection associated with the hernia. Musculoskeletal: No significant skeletal lesion. IMPRESSION: 1. Mild hepatic steatosis. 2. Small hiatal hernia. 3. Uncomplicated colonic diverticulosis. 4. Midline ventral hernia containing fat or omentum. Electronically Signed   By: Andreas Newport [name]   On: 04/24/2017 21:14   Dg Chest Port 1 View  Result Date: 04/26/2017 CLINICAL DATA:  Fever EXAM: PORTABLE CHEST 1 VIEW COMPARISON:  February 22, 2007 FINDINGS: There is no edema or consolidation. Heart is mildly prominent with pulmonary vascularity within normal limits. No adenopathy. No bone lesions. IMPRESSION: Mild cardiac prominence.  No edema or consolidation. Electronically Signed   By: Lowella Grip III [name]   On: 04/26/2017 07:11        Scheduled Meds: . buPROPion  300 mg Oral Daily  . enoxaparin (LOVENOX) injection  50 mg Subcutaneous Q24H  . pantoprazole (PROTONIX) IV  40 mg Intravenous Q12H   Continuous Infusions: . sodium chloride 125 mL/hr at 04/26/17 0532  . piperacillin-tazobactam (ZOSYN)  IV       LOS: 0 days     Cordelia Poche, MD [facility] 04/26/2017, 12:06 PM Pager: [phone]  If 7PM-7AM, please contact  night-coverage [email or web address] Password [email or web address] 04/26/2017, 12:06 PM

## 2017-04-27 ENCOUNTER — Inpatient Hospital Stay (HOSPITAL_COMMUNITY): Payer: BLUE CROSS/BLUE SHIELD

## 2017-04-27 DIAGNOSIS — Z86010 Personal history of colonic polyps: Secondary | ICD-10-CM | POA: Diagnosis not present

## 2017-04-27 DIAGNOSIS — Z90710 Acquired absence of both cervix and uterus: Secondary | ICD-10-CM | POA: Diagnosis not present

## 2017-04-27 DIAGNOSIS — K5730 Diverticulosis of large intestine without perforation or abscess without bleeding: Secondary | ICD-10-CM | POA: Diagnosis not present

## 2017-04-27 DIAGNOSIS — F419 Anxiety disorder, unspecified: Secondary | ICD-10-CM | POA: Diagnosis present

## 2017-04-27 DIAGNOSIS — F329 Major depressive disorder, single episode, unspecified: Secondary | ICD-10-CM | POA: Diagnosis present

## 2017-04-27 DIAGNOSIS — K439 Ventral hernia without obstruction or gangrene: Secondary | ICD-10-CM | POA: Diagnosis present

## 2017-04-27 DIAGNOSIS — Z833 Family history of diabetes mellitus: Secondary | ICD-10-CM | POA: Diagnosis not present

## 2017-04-27 DIAGNOSIS — Z9049 Acquired absence of other specified parts of digestive tract: Secondary | ICD-10-CM | POA: Diagnosis not present

## 2017-04-27 DIAGNOSIS — R1013 Epigastric pain: Secondary | ICD-10-CM | POA: Diagnosis not present

## 2017-04-27 DIAGNOSIS — R112 Nausea with vomiting, unspecified: Secondary | ICD-10-CM

## 2017-04-27 DIAGNOSIS — K429 Umbilical hernia without obstruction or gangrene: Secondary | ICD-10-CM | POA: Diagnosis present

## 2017-04-27 DIAGNOSIS — R935 Abnormal findings on diagnostic imaging of other abdominal regions, including retroperitoneum: Secondary | ICD-10-CM | POA: Diagnosis not present

## 2017-04-27 DIAGNOSIS — Z8249 Family history of ischemic heart disease and other diseases of the circulatory system: Secondary | ICD-10-CM | POA: Diagnosis not present

## 2017-04-27 DIAGNOSIS — E876 Hypokalemia: Secondary | ICD-10-CM | POA: Diagnosis not present

## 2017-04-27 DIAGNOSIS — Z800 Family history of malignant neoplasm of digestive organs: Secondary | ICD-10-CM | POA: Diagnosis not present

## 2017-04-27 DIAGNOSIS — R160 Hepatomegaly, not elsewhere classified: Secondary | ICD-10-CM | POA: Diagnosis not present

## 2017-04-27 DIAGNOSIS — B349 Viral infection, unspecified: Secondary | ICD-10-CM | POA: Diagnosis present

## 2017-04-27 DIAGNOSIS — Z90721 Acquired absence of ovaries, unilateral: Secondary | ICD-10-CM | POA: Diagnosis not present

## 2017-04-27 DIAGNOSIS — Z87442 Personal history of urinary calculi: Secondary | ICD-10-CM | POA: Diagnosis not present

## 2017-04-27 DIAGNOSIS — K219 Gastro-esophageal reflux disease without esophagitis: Secondary | ICD-10-CM | POA: Diagnosis present

## 2017-04-27 DIAGNOSIS — R7989 Other specified abnormal findings of blood chemistry: Secondary | ICD-10-CM | POA: Diagnosis not present

## 2017-04-27 DIAGNOSIS — Z87891 Personal history of nicotine dependence: Secondary | ICD-10-CM | POA: Diagnosis not present

## 2017-04-27 DIAGNOSIS — Z813 Family history of other psychoactive substance abuse and dependence: Secondary | ICD-10-CM | POA: Diagnosis not present

## 2017-04-27 LAB — COMPREHENSIVE METABOLIC PANEL
ALT: 108 U/L — ABNORMAL HIGH (ref 14–54)
AST: 59 U/L — ABNORMAL HIGH (ref 15–41)
Albumin: 3 g/dL — ABNORMAL LOW (ref 3.5–5.0)
Alkaline Phosphatase: 135 U/L — ABNORMAL HIGH (ref 38–126)
Anion gap: 6 (ref 5–15)
BUN: 6 mg/dL (ref 6–20)
CO2: 28 mmol/L (ref 22–32)
Calcium: 8.1 mg/dL — ABNORMAL LOW (ref 8.9–10.3)
Chloride: 106 mmol/L (ref 101–111)
Creatinine, Ser: 0.79 mg/dL (ref 0.44–1.00)
GFR calc Af Amer: >60 mL/min (ref >60)
GFR calc non Af Amer: >60 mL/min (ref >60)
Glucose, Bld: 103 mg/dL — ABNORMAL HIGH (ref 65–99)
Potassium: 3.4 mmol/L — ABNORMAL LOW (ref 3.5–5.1)
Sodium: 140 mmol/L (ref 135–145)
Total Bilirubin: 0.6 mg/dL (ref 0.3–1.2)
Total Protein: 6.6 g/dL (ref 6.5–8.1)

## 2017-04-27 LAB — CBC
HCT: 34.3 % — ABNORMAL LOW (ref 36.0–46.0)
Hemoglobin: 11.5 g/dL — ABNORMAL LOW (ref 12.0–15.0)
MCH: 28.6 pg (ref 26.0–34.0)
MCHC: 33.5 g/dL (ref 30.0–36.0)
MCV: 85.3 fL (ref 78.0–100.0)
Platelets: 157 10*3/uL (ref 150–400)
RBC: 4.02 MIL/uL (ref 3.87–5.11)
RDW: 13.9 % (ref 11.5–15.5)
WBC: 4.4 10*3/uL (ref 4.0–10.5)

## 2017-04-27 LAB — IGG: IgG (Immunoglobin G), Serum: 1003 mg/dL (ref 700–1600)

## 2017-04-27 LAB — ANA W/REFLEX IF POSITIVE: Anti Nuclear Antibody(ANA): NEGATIVE

## 2017-04-27 LAB — HEPATITIS PANEL, ACUTE
HCV Ab: <0.1 s/co ratio (ref 0.0–0.9)
Hep A IgM: NEGATIVE
Hep B C IgM: NEGATIVE
Hepatitis B Surface Ag: NEGATIVE

## 2017-04-27 MED ORDER — GADOBENATE DIMEGLUMINE 529 MG/ML IV SOLN
20.0000 mL | Freq: Once | INTRAVENOUS | Status: AC | PRN
  Administered 2017-04-27: 20 mL via INTRAVENOUS

## 2017-04-27 MED ORDER — POTASSIUM CHLORIDE CRYS ER 20 MEQ PO TBCR
20.0000 meq | EXTENDED_RELEASE_TABLET | Freq: Once | ORAL | Status: AC
  Administered 2017-04-27: 20 meq via ORAL
  Filled 2017-04-27: qty 1

## 2017-04-27 MED ORDER — LORAZEPAM 2 MG/ML IJ SOLN
2.0000 mg | Freq: Once | INTRAMUSCULAR | Status: AC
  Administered 2017-04-27: 2 mg via INTRAVENOUS
  Filled 2017-04-27: qty 1

## 2017-04-27 NOTE — Progress Notes (Signed)
Contacted by the patient's nurse. Patient requested phone call regarding MRCP results. Patient interested in advancing diet  MRCP reviewed which shows fatty liver, pancreas divisum without pancreatitis, normal common bile duct without choledocholithiasis. The bowel appears normal. There is no abdominal fluid or ascites. Vascular and lymphatics appear normal.  Patient still with some upper abdominal pain but increasing appetite. We discussed how narcotic pain medications can cause nausea and the leg gastric emptying so we would like to minimize these.  We will try to advance to a bland diet to see how this is tolerated. My suspicion is that she had a viral gastroenteritis leading to bump in liver enzymes. There is no evidence of viral hepatitis or autoimmune hepatitis. If she does not tolerate advancing diet then we will consider endoscopy. She is happy with this plan

## 2017-04-27 NOTE — Progress Notes (Signed)
Daily Rounding Note  04/27/2017, 8:13 AM  LOS: 0 days   SUBJECTIVE:   Chief complaint: epigastric pain.  This is better, still with nausea.  Only dry heaves now, no vomitting for 2 plus days.  Still does not feel well.  Unable to complete MRCP due to pt anxiety.  This will be reattempted with pre test IV ativan, pt aware it is important to obtain this.        OBJECTIVE:         Vital signs in last 24 hours:    Temp:  [98 F (36.7 C)-98.7 F (37.1 C)] 98.7 F (37.1 C) (05/05 0555) Pulse Rate:  [79-92] 81 (05/05 0555) Resp:  [14-18] 14 (05/05 0555) BP: (134-149)/(64-88) 134/78 (05/05 0555) SpO2:  [92 %-100 %] 92 % (05/05 0555) Last BM Date: 04/24/17 Filed Weights   04/24/17 1818 04/25/17 0406  Weight: 104.3 kg (230 lb) 106.1 kg (234 lb)   General: obese, not ill looking.     Heart: RRR Chest: clear bil.   Abdomen: soft, obese, ND.  BS active.  Tender without guard/rebound, in epigastrium into mid upper abdomen  Extremities: no CCE Neuro/Psych:  Oriented x 3.  No gross weakness, no tremor.   Intake/Output from previous day: 05/04 0701 - 05/05 0700 In: 4865 [P.O.:660; I.V.:4205] Out: 6644 [Urine:1752]  Intake/Output this shift: No intake/output data recorded.  Lab Results:  Recent Labs  04/24/17 1922 04/25/17 0457 04/27/17 0433  WBC 12.1* 7.1 4.4  HGB 15.2* 13.6 11.5*  HCT 43.9 40.0 34.3*  PLT 289 195 157   BMET  Recent Labs  04/25/17 0457 04/26/17 0438 04/27/17 0433  NA 139 136 140  K 4.2 3.6 3.4*  CL 108 108 106  CO2 23 22 28   GLUCOSE 143* 107* 103*  BUN 15 8 6   CREATININE 0.87 0.75 0.79  CALCIUM 8.1* 7.6* 8.1*   LFT  Recent Labs  04/25/17 0457 04/26/17 0438 04/27/17 0433  PROT 6.8 6.2* 6.6  ALBUMIN 3.5 3.0* 3.0*  AST 57* 147* 59*  ALT 59* 158* 108*  ALKPHOS 90 146* 135*  BILITOT 0.7 0.7 0.6   PT/INR No results for input(s): LABPROT, INR in the last 72 hours. Hepatitis  Panel  Recent Labs  04/26/17 1354  HEPBSAG Negative  HCVAB <0.1  HEPAIGM Negative  HEPBIGM Negative    Studies/Results: Dg Chest Port 1 View  Result Date: 04/26/2017 CLINICAL DATA:  Fever EXAM: PORTABLE CHEST 1 VIEW COMPARISON:  February 22, 2007 FINDINGS: There is no edema or consolidation. Heart is mildly prominent with pulmonary vascularity within normal limits. No adenopathy. No bone lesions. IMPRESSION: Mild cardiac prominence.  No edema or consolidation. Electronically Signed   By: Lowella Grip III [name]   On: 04/26/2017 07:11   Scheduled Meds: . buPROPion  300 mg Oral Daily  . enoxaparin (LOVENOX) injection  50 mg Subcutaneous Q24H  . pantoprazole (PROTONIX) IV  40 mg Intravenous Q12H   Continuous Infusions: . sodium chloride 125 mL/hr at 04/26/17 1348  . piperacillin-tazobactam (ZOSYN)  IV 3.375 g (04/27/17 0813)   PRN Meds:.acetaminophen, cyclobenzaprine, morphine injection, ondansetron (ZOFRAN) IV, oxyCODONE-acetaminophen, promethazine   ASSESMENT:   *  Epigastric pain, N/V,  elevated LFTs, Lipase normal.  Remote cholecystectomy in 1990s.   CT with fatty liver, small HH, non-obstructing ventral hernia, diverticulosis without diverticulitis.  MRCP ordered. LFTs improved.  IgG wnl.  Acute hepatitis panel negative. Smooth muscle Ab and ANA pending ?  Viral illness, ? PUD.  Her hx IBS may have flared in setting of current illness.     *  Mild hypokalemia.       PLAN   *  ? Need for the Zosyn?    *  Has not had U/A, and sxs atypical for UTI but do we need to check urine, although with abx on board may be irrelevant.     Azucena Freed  04/27/2017, 8:13 AM Pager: [phone]

## 2017-04-27 NOTE — Progress Notes (Signed)
PROGRESS NOTE    [name]  RDE:[id number] DOB: 1966/10/04 DOA: 04/24/2017 PCP: Jearld Fenton, NP   Brief Narrative: [name] is a 51 y.o. female with a history of cholecystectomy, hernia repair with mesh, hydronephrosis secondary to nephrolithiasis, anxiety, depression, GERD. She presents with epigastric pain initially thought to be secondary to ventral hernia but now with elevating liver enzymes and fever   Assessment & Plan:   Principal Problem:   Abdominal pain, acute, epigastric Active Problems:   Ventral hernia without obstruction or gangrene   Nausea & vomiting   Elevated LFTs   Epigastric pain Possibly secondary to biliary pathology. Still with nausea without vomiting. AST/ALT and alkaline phosphatase trending down. Bilirubin stable and within normal limits. Afebrile overnight. -GI recommendations: MRCP -continue Zosyn secondary to fever. Will discontinue pending blood cultures -Blood cultures obtained (5/4) and are pending  Ventral hernia Evaluated by general surgery. Can follow-up outpatient  Nausea and vomiting No vomiting. Nausea persistent but improved -continue antiemetics  Headache -Continue Percocet. Will avoid NSAIDs.    DVT prophylaxis: Lovenox Code Status: Full code Family Communication: Husband at bedside Disposition Plan: Discharge pending GI workup   Consultants:   General surgery (5/3)  [facility] (5/4)  Procedures:   None  Antimicrobials:  Zosyn (5/4>>    Subjective: Pain improved. Nausea persistent but controlled. No emesis.  Objective: Vitals:   04/26/17 0652 04/26/17 1459 04/26/17 2123 04/27/17 0555  BP:  137/64 (!) 149/88 134/78  Pulse:  92 79 81  Resp:  18 16 14   Temp: 98.9 F (37.2 C) 98 F (36.7 C) 98.4 F (36.9 C) 98.7 F (37.1 C)  TempSrc: Oral Oral Oral Oral  SpO2:  98% 100% 92%  Weight:      Height:        Intake/Output Summary (Last 24 hours) at 04/27/17 1117 Last data filed at 04/27/17 0556  Gross per 24 hour  Intake             4565 ml  Output             1750 ml  Net             2815 ml   Filed Weights   04/24/17 1818 04/25/17 0406  Weight: 104.3 kg (230 lb) 106.1 kg (234 lb)    Examination:  General exam: Appears calm and comfortable  Respiratory system: Clear to auscultation. Respiratory effort normal. Cardiovascular system: S1 & S2 heard, RRR. No murmurs. Gastrointestinal system: Abdomen is nondistended, soft and tender in right upper quadrant. No organomegaly or masses felt. Normal bowel sounds heard. Central nervous system: Alert and oriented. No focal neurological deficits. Extremities: No edema. No calf tenderness Skin: No cyanosis. No rashes Psychiatry: Judgement and insight appear normal. Mood & affect appropriate.     Data Reviewed: I have personally reviewed following labs and imaging studies  CBC:  Recent Labs Lab 04/24/17 1922 04/25/17 0457 04/27/17 0433  WBC 12.1* 7.1 4.4  NEUTROABS 10.1*  --   --   HGB 15.2* 13.6 11.5*  HCT 43.9 40.0 34.3*  MCV 84.1 85.1 85.3  PLT 289 195 631   Basic Metabolic Panel:  Recent Labs Lab 04/24/17 1921 04/25/17 0457 04/26/17 0438 04/27/17 0433  NA 139 139 136 140  K 3.7 4.2 3.6 3.4*  CL 104 108 108 106  CO2 25 23 22 28   GLUCOSE 110* 143* 107* 103*  BUN 15 15 8 6   CREATININE 0.88 0.87 0.75 0.79  CALCIUM 9.6  8.1* 7.6* 8.1*   GFR: Estimated Creatinine Clearance: 102.4 mL/min (by C-G formula based on SCr of 0.79 mg/dL). Liver Function Tests:  Recent Labs Lab 04/24/17 1921 04/25/17 0457 04/26/17 0438 04/27/17 0433  AST 22 57* 147* 59*  ALT 21 59* 158* 108*  ALKPHOS 101 90 146* 135*  BILITOT 0.4 0.7 0.7 0.6  PROT 8.3* 6.8 6.2* 6.6  ALBUMIN 4.3 3.5 3.0* 3.0*    Recent Labs Lab 04/24/17 1921  LIPASE 31   No results for input(s): AMMONIA in the last 168 hours. Coagulation Profile: No results for input(s): INR, PROTIME in the last 168 hours. Cardiac Enzymes: No results for input(s):  CKTOTAL, CKMB, CKMBINDEX, TROPONINI in the last 168 hours. BNP (last 3 results) No results for input(s): PROBNP in the last 8760 hours. HbA1C: No results for input(s): HGBA1C in the last 72 hours. CBG: No results for input(s): GLUCAP in the last 168 hours. Lipid Profile: No results for input(s): CHOL, HDL, LDLCALC, TRIG, CHOLHDL, LDLDIRECT in the last 72 hours. Thyroid Function Tests: No results for input(s): TSH, T4TOTAL, FREET4, T3FREE, THYROIDAB in the last 72 hours. Anemia Panel: No results for input(s): VITAMINB12, FOLATE, FERRITIN, TIBC, IRON, RETICCTPCT in the last 72 hours. Sepsis Labs: No results for input(s): PROCALCITON, LATICACIDVEN in the last 168 hours.  No results found for this or any previous visit (from the past 240 hour(s)).       Radiology Studies: Dg Chest Port 1 View  Result Date: 04/26/2017 CLINICAL DATA:  Fever EXAM: PORTABLE CHEST 1 VIEW COMPARISON:  February 22, 2007 FINDINGS: There is no edema or consolidation. Heart is mildly prominent with pulmonary vascularity within normal limits. No adenopathy. No bone lesions. IMPRESSION: Mild cardiac prominence.  No edema or consolidation. Electronically Signed   By: Lowella Grip III [name]   On: 04/26/2017 07:11        Scheduled Meds: . buPROPion  300 mg Oral Daily  . enoxaparin (LOVENOX) injection  50 mg Subcutaneous Q24H  . LORazepam  2 mg Intravenous Once  . pantoprazole (PROTONIX) IV  40 mg Intravenous Q12H   Continuous Infusions: . sodium chloride 125 mL/hr at 04/26/17 1348  . piperacillin-tazobactam (ZOSYN)  IV 3.375 g (04/27/17 0813)     LOS: 0 days     Cordelia Poche, MD [facility] 04/27/2017, 11:17 AM Pager: [phone]  If 7PM-7AM, please contact night-coverage [email or web address] Password [email or web address] 04/27/2017, 11:17 AM

## 2017-04-28 LAB — COMPREHENSIVE METABOLIC PANEL
ALT: 74 U/L — ABNORMAL HIGH (ref 14–54)
AST: 33 U/L (ref 15–41)
Albumin: 2.9 g/dL — ABNORMAL LOW (ref 3.5–5.0)
Alkaline Phosphatase: 115 U/L (ref 38–126)
Anion gap: 7 (ref 5–15)
BUN: 7 mg/dL (ref 6–20)
CO2: 26 mmol/L (ref 22–32)
Calcium: 8 mg/dL — ABNORMAL LOW (ref 8.9–10.3)
Chloride: 107 mmol/L (ref 101–111)
Creatinine, Ser: 0.69 mg/dL (ref 0.44–1.00)
GFR calc Af Amer: >60 mL/min (ref >60)
GFR calc non Af Amer: >60 mL/min (ref >60)
Glucose, Bld: 111 mg/dL — ABNORMAL HIGH (ref 65–99)
Potassium: 2.9 mmol/L — ABNORMAL LOW (ref 3.5–5.1)
Sodium: 140 mmol/L (ref 135–145)
Total Bilirubin: 0.2 mg/dL — ABNORMAL LOW (ref 0.3–1.2)
Total Protein: 6.3 g/dL — ABNORMAL LOW (ref 6.5–8.1)

## 2017-04-28 LAB — ANTI-SMOOTH MUSCLE ANTIBODY, IGG: F-Actin IgG: 44 Units — ABNORMAL HIGH (ref 0–19)

## 2017-04-28 MED ORDER — DICYCLOMINE HCL 20 MG PO TABS
20.0000 mg | ORAL_TABLET | Freq: Three times a day (TID) | ORAL | Status: DC
  Administered 2017-04-28 – 2017-04-29 (×4): 20 mg via ORAL
  Filled 2017-04-28 (×5): qty 1

## 2017-04-28 MED ORDER — CALCIUM CARBONATE ANTACID 500 MG PO CHEW
1.0000 | CHEWABLE_TABLET | Freq: Every day | ORAL | Status: DC | PRN
  Administered 2017-04-28: 200 mg via ORAL
  Filled 2017-04-28: qty 1

## 2017-04-28 MED ORDER — POTASSIUM CHLORIDE CRYS ER 20 MEQ PO TBCR
40.0000 meq | EXTENDED_RELEASE_TABLET | Freq: Once | ORAL | Status: AC
  Administered 2017-04-28: 40 meq via ORAL
  Filled 2017-04-28: qty 2

## 2017-04-28 MED ORDER — POLYETHYLENE GLYCOL 3350 17 G PO PACK
17.0000 g | PACK | Freq: Every day | ORAL | Status: DC
  Administered 2017-04-29: 17 g via ORAL
  Filled 2017-04-28: qty 1

## 2017-04-28 MED ORDER — POLYETHYLENE GLYCOL 3350 17 G PO PACK
34.0000 g | PACK | Freq: Once | ORAL | Status: AC
  Administered 2017-04-28: 34 g via ORAL
  Filled 2017-04-28: qty 2

## 2017-04-28 MED ORDER — PANTOPRAZOLE SODIUM 40 MG PO TBEC
40.0000 mg | DELAYED_RELEASE_TABLET | Freq: Every day | ORAL | Status: DC
  Administered 2017-04-29: 40 mg via ORAL
  Filled 2017-04-28: qty 1

## 2017-04-28 MED ORDER — OXYCODONE-ACETAMINOPHEN 5-325 MG PO TABS
1.0000 | ORAL_TABLET | ORAL | Status: DC | PRN
  Administered 2017-04-29: 1 via ORAL
  Filled 2017-04-28: qty 1

## 2017-04-28 NOTE — Progress Notes (Signed)
PROGRESS NOTE    [name]  AXE:[id number] DOB: [birth year] DOA: 04/24/2017 PCP: Jearld Fenton, NP   Brief Narrative: [name] is a 51 y.o. female with a history of cholecystectomy, hernia repair with mesh, hydronephrosis secondary to nephrolithiasis, anxiety, depression, GERD. She presents with epigastric pain initially thought to be secondary to ventral hernia but now with elevating liver enzymes and fever   Assessment & Plan:   Principal Problem:   Abdominal pain, acute, epigastric Active Problems:   Ventral hernia without obstruction or gangrene   Nausea & vomiting   Elevated LFTs   Epigastric pain Improving. AST/ALT/Alk phos improving. MRCP unremarkable for etiology. -GI recommendations: advance diet -Blood cultures obtained (5/4) negative to date and are pending  Ventral hernia Evaluated by general surgery. Can follow-up outpatient  Nausea and vomiting No vomiting. Nausea persistent and controlled with antiemetic -continue antiemetics  Headache -Continue Percocet. Will avoid NSAIDs.    DVT prophylaxis: Lovenox Code Status: Full code Family Communication: Husband at bedside Disposition Plan: Discharge pending GI workup   Consultants:   General surgery (5/3)  [facility] (5/4)  Procedures:   None  Antimicrobials:  Zosyn (5/4>>    Subjective: Persistent nausea without emesis. Abdominal pain improving. No bowel movement.  Objective: Vitals:   04/27/17 0555 04/27/17 1441 04/27/17 2103 04/28/17 0638  BP: 134/78 130/83 (!) 148/85 128/67  Pulse: 81 91 100 74  Resp: 14 16 20 20   Temp: 98.7 F (37.1 C) 98.6 F (37 C) 98.6 F (37 C) 97.8 F (36.6 C)  TempSrc: Oral Oral Oral Oral  SpO2: 92% 91% 93% 97%  Weight:      Height:        Intake/Output Summary (Last 24 hours) at 04/28/17 1138 Last data filed at 04/28/17 1000  Gross per 24 hour  Intake          3762.92 ml  Output                0 ml  Net          3762.92 ml   Filed Weights     04/24/17 1818 04/25/17 0406  Weight: 104.3 kg (230 lb) 106.1 kg (234 lb)    Examination:  General exam: Appears calm and comfortable  Respiratory system: Clear to auscultation. Respiratory effort normal. Cardiovascular system: S1 & S2 heard, RRR. No murmurs. Gastrointestinal system: Abdomen is nondistended, soft and mildly tender in right upper quadrant. Normal bowel sounds heard. Central nervous system: Alert and oriented. No focal neurological deficits. Extremities: No edema. No calf tenderness Skin: No cyanosis. No rashes Psychiatry: Judgement and insight appear normal. Mood & affect appropriate.     Data Reviewed: I have personally reviewed following labs and imaging studies  CBC:  Recent Labs Lab 04/24/17 1922 04/25/17 0457 04/27/17 0433  WBC 12.1* 7.1 4.4  NEUTROABS 10.1*  --   --   HGB 15.2* 13.6 11.5*  HCT 43.9 40.0 34.3*  MCV 84.1 85.1 85.3  PLT 289 195 110   Basic Metabolic Panel:  Recent Labs Lab 04/24/17 1921 04/25/17 0457 04/26/17 0438 04/27/17 0433 04/28/17 0810  NA 139 139 136 140 140  K 3.7 4.2 3.6 3.4* 2.9*  CL 104 108 108 106 107  CO2 25 23 22 28 26   GLUCOSE 110* 143* 107* 103* 111*  BUN 15 15 8 6 7   CREATININE 0.88 0.87 0.75 0.79 0.69  CALCIUM 9.6 8.1* 7.6* 8.1* 8.0*   GFR: Estimated Creatinine Clearance: 102.4 mL/min (by C-G  formula based on SCr of 0.69 mg/dL). Liver Function Tests:  Recent Labs Lab 04/24/17 1921 04/25/17 0457 04/26/17 0438 04/27/17 0433 04/28/17 0810  AST 22 57* 147* 59* 33  ALT 21 59* 158* 108* 74*  ALKPHOS 101 90 146* 135* 115  BILITOT 0.4 0.7 0.7 0.6 0.2*  PROT 8.3* 6.8 6.2* 6.6 6.3*  ALBUMIN 4.3 3.5 3.0* 3.0* 2.9*    Recent Labs Lab 04/24/17 1921  LIPASE 31   No results for input(s): AMMONIA in the last 168 hours. Coagulation Profile: No results for input(s): INR, PROTIME in the last 168 hours. Cardiac Enzymes: No results for input(s): CKTOTAL, CKMB, CKMBINDEX, TROPONINI in the last 168  hours. BNP (last 3 results) No results for input(s): PROBNP in the last 8760 hours. HbA1C: No results for input(s): HGBA1C in the last 72 hours. CBG: No results for input(s): GLUCAP in the last 168 hours. Lipid Profile: No results for input(s): CHOL, HDL, LDLCALC, TRIG, CHOLHDL, LDLDIRECT in the last 72 hours. Thyroid Function Tests: No results for input(s): TSH, T4TOTAL, FREET4, T3FREE, THYROIDAB in the last 72 hours. Anemia Panel: No results for input(s): VITAMINB12, FOLATE, FERRITIN, TIBC, IRON, RETICCTPCT in the last 72 hours. Sepsis Labs: No results for input(s): PROCALCITON, LATICACIDVEN in the last 168 hours.  Recent Results (from the past 240 hour(s))  Culture, blood (routine x 2)     Status: None (Preliminary result)   Collection Time: 04/26/17  4:38 AM  Result Value Ref Range Status   Specimen Description BLOOD RIGHT ARM  Final   Special Requests   Final    BOTTLES DRAWN AEROBIC ONLY Blood Culture adequate volume   Culture   Final    NO GROWTH 1 DAY Performed at [facility], [address] [place], [place] [address]    Report Status PENDING  Incomplete  Culture, blood (routine x 2)     Status: None (Preliminary result)   Collection Time: 04/26/17  4:46 AM  Result Value Ref Range Status   Specimen Description BLOOD LEFT HAND  Final   Special Requests IN PEDIATRIC BOTTLE Blood Culture adequate volume  Final   Culture   Final    NO GROWTH 1 DAY Performed at [facility], [place] [address] [place],  [address]    Report Status PENDING  Incomplete         Radiology Studies: Mr 3d Recon At Scanner  Result Date: 04/27/2017 CLINICAL DATA:  Elevated liver function tests. EXAM: MRI ABDOMEN WITHOUT AND WITH CONTRAST (INCLUDING MRCP) TECHNIQUE: Multiplanar multisequence MR imaging of the abdomen was performed both before and after the administration of intravenous contrast. Heavily T2-weighted images of the biliary and pancreatic ducts were obtained, and  three-dimensional MRCP images were rendered by post processing. CONTRAST:  82m MULTIHANCE GADOBENATE DIMEGLUMINE 529 MG/ML IV SOLN COMPARISON:  CT of 04/24/2017 FINDINGS: Mild-to-moderate motion degradation, primarily affecting the pre and postcontrast dynamic series. Lower chest: Trace bilateral pleural fluid.  Mild cardiomegaly. Hepatobiliary: Moderate hepatic steatosis. Hepatomegaly at 19.2 cm craniocaudal. Tiny right hepatic lobe cyst. No suspicious liver lesion. Cholecystectomy. No intrahepatic biliary duct dilatation. Normal common duct caliber, including at 9 mm on image 38/ series 5. No choledocholithiasis. Pancreas: Pancreas divisum, with the dorsal duct entering the duodenum on image 38/series 4 and image 40/series 5. No pancreatic duct dilatation or acute pancreatitis. Spleen:  Normal in size, without focal abnormality. Adrenals/Urinary Tract: Normal adrenal glands. Normal kidneys, without hydronephrosis. Stomach/Bowel: Normal stomach and abdominal bowel loops. Vascular/Lymphatic: Normal  caliber of the aorta and branch vessels. Multiple left renal arteries. No retroperitoneal or retrocrural adenopathy. Other:  No ascites. Musculoskeletal: No acute osseous abnormality. IMPRESSION: 1. Motion degraded exam. 2. Hepatic steatosis and hepatomegaly. 3. Cholecystectomy without biliary duct dilatation. No evidence of choledocholithiasis. 4. Pancreas divisum. Electronically Signed   By: Abigail Miyamoto [name]   On: 04/27/2017 14:03   Mr Abdomen Mrcp Moise Boring Contast  Result Date: 04/27/2017 CLINICAL DATA:  Elevated liver function tests. EXAM: MRI ABDOMEN WITHOUT AND WITH CONTRAST (INCLUDING MRCP) TECHNIQUE: Multiplanar multisequence MR imaging of the abdomen was performed both before and after the administration of intravenous contrast. Heavily T2-weighted images of the biliary and pancreatic ducts were obtained, and three-dimensional MRCP images were rendered by post processing. CONTRAST:  11m MULTIHANCE GADOBENATE  DIMEGLUMINE 529 MG/ML IV SOLN COMPARISON:  CT of 04/24/2017 FINDINGS: Mild-to-moderate motion degradation, primarily affecting the pre and postcontrast dynamic series. Lower chest: Trace bilateral pleural fluid.  Mild cardiomegaly. Hepatobiliary: Moderate hepatic steatosis. Hepatomegaly at 19.2 cm craniocaudal. Tiny right hepatic lobe cyst. No suspicious liver lesion. Cholecystectomy. No intrahepatic biliary duct dilatation. Normal common duct caliber, including at 9 mm on image 38/ series 5. No choledocholithiasis. Pancreas: Pancreas divisum, with the dorsal duct entering the duodenum on image 38/series 4 and image 40/series 5. No pancreatic duct dilatation or acute pancreatitis. Spleen:  Normal in size, without focal abnormality. Adrenals/Urinary Tract: Normal adrenal glands. Normal kidneys, without hydronephrosis. Stomach/Bowel: Normal stomach and abdominal bowel loops. Vascular/Lymphatic: Normal caliber of the aorta and branch vessels. Multiple left renal arteries. No retroperitoneal or retrocrural adenopathy. Other:  No ascites. Musculoskeletal: No acute osseous abnormality. IMPRESSION: 1. Motion degraded exam. 2. Hepatic steatosis and hepatomegaly. 3. Cholecystectomy without biliary duct dilatation. No evidence of choledocholithiasis. 4. Pancreas divisum. Electronically Signed   By: KAbigail MiyamotoM.D.   On: 04/27/2017 14:03        Scheduled Meds: . buPROPion  300 mg Oral Daily  . enoxaparin (LOVENOX) injection  50 mg Subcutaneous Q24H  . pantoprazole  40 mg Oral Q0600  . potassium chloride  40 mEq Oral Once   Continuous Infusions: . sodium chloride 125 mL/hr at 04/28/17 0457     LOS: 1 day     RCordelia Poche MD [facility] 04/28/2017, 11:38 AM Pager: [phone] If 7PM-7AM, please contact night-coverage [email or web address] Password [email or web address] 04/28/2017, 11:38 AM

## 2017-04-28 NOTE — Progress Notes (Signed)
Daily Rounding Note  04/28/2017, 9:24 AM  LOS: 1 day   SUBJECTIVE:   Chief complaint:  Nausea improved, persists but not worsened by solid food.  No vomiting.  Epigastric pain improved but not yet resolved.  Walked in hall yesterday.  Used 20 mg oxycodone yesterday, none yet today.     No BMs  OBJECTIVE:         Vital signs in last 24 hours:    Temp:  [97.8 F (36.6 C)-98.6 F (37 C)] 97.8 F (36.6 C) (05/06 4097) Pulse Rate:  [74-100] 74 (05/06 0638) Resp:  [16-20] 20 (05/06 3532) BP: (128-148)/(67-85) 128/67 (05/06 0638) SpO2:  [91 %-97 %] 97 % (05/06 9924) Weight:  [106.1 kg (234 lb)] 106.1 kg (234 lb) (05/05 1352) Last BM Date: 04/24/17 Filed Weights   04/24/17 1818 04/25/17 0406  Weight: 104.3 kg (230 lb) 106.1 kg (234 lb)   General: in shower, so spoke with pt but no examination.     Heart:  Chest: no dyspnea or cough Abdomen:   Extremities:  Neuro/Psych:  Calm, pleasant.   Intake/Output from previous day: 05/05 0701 - 05/06 0700 In: 3402.9 [P.O.:480; I.V.:2772.9; IV Piggyback:150] Out: -   Intake/Output this shift: No intake/output data recorded.  Lab Results:  Recent Labs  04/27/17 0433  WBC 4.4  HGB 11.5*  HCT 34.3*  PLT 157   BMET  Recent Labs  04/26/17 0438 04/27/17 0433 04/28/17 0810  NA 136 140 140  K 3.6 3.4* 2.9*  CL 108 106 107  CO2 22 28 26   GLUCOSE 107* 103* 111*  BUN 8 6 7   CREATININE 0.75 0.79 0.69  CALCIUM 7.6* 8.1* 8.0*   LFT  Recent Labs  04/26/17 0438 04/27/17 0433 04/28/17 0810  PROT 6.2* 6.6 6.3*  ALBUMIN 3.0* 3.0* 2.9*  AST 147* 59* 33  ALT 158* 108* 74*  ALKPHOS 146* 135* 115  BILITOT 0.7 0.6 0.2*   PT/INR No results for input(s): LABPROT, INR in the last 72 hours. Hepatitis Panel  Recent Labs  04/26/17 1354  HEPBSAG Negative  HCVAB <0.1  HEPAIGM Negative  HEPBIGM Negative    Studies/Results: Mr 3d Recon At Scanner  Result Date:  04/27/2017 CLINICAL DATA:  Elevated liver function tests. EXAM: MRI ABDOMEN WITHOUT AND WITH CONTRAST (INCLUDING MRCP) TECHNIQUE: Multiplanar multisequence MR imaging of the abdomen was performed both before and after the administration of intravenous contrast. Heavily T2-weighted images of the biliary and pancreatic ducts were obtained, and three-dimensional MRCP images were rendered by post processing. CONTRAST:  46mL MULTIHANCE GADOBENATE DIMEGLUMINE 529 MG/ML IV SOLN COMPARISON:  CT of 04/24/2017 FINDINGS: Mild-to-moderate motion degradation, primarily affecting the pre and postcontrast dynamic series. Lower chest: Trace bilateral pleural fluid.  Mild cardiomegaly. Hepatobiliary: Moderate hepatic steatosis. Hepatomegaly at 19.2 cm craniocaudal. Tiny right hepatic lobe cyst. No suspicious liver lesion. Cholecystectomy. No intrahepatic biliary duct dilatation. Normal common duct caliber, including at 9 mm on image 38/ series 5. No choledocholithiasis. Pancreas: Pancreas divisum, with the dorsal duct entering the duodenum on image 38/series 4 and image 40/series 5. No pancreatic duct dilatation or acute pancreatitis. Spleen:  Normal in size, without focal abnormality. Adrenals/Urinary Tract: Normal adrenal glands. Normal kidneys, without hydronephrosis. Stomach/Bowel: Normal stomach and abdominal bowel loops. Vascular/Lymphatic: Normal caliber of the aorta and branch vessels. Multiple left renal arteries. No retroperitoneal or retrocrural adenopathy. Other:  No ascites. Musculoskeletal: No acute osseous abnormality. IMPRESSION: 1. Motion degraded exam. 2. Hepatic  steatosis and hepatomegaly. 3. Cholecystectomy without biliary duct dilatation. No evidence of choledocholithiasis. 4. Pancreas divisum. Electronically Signed   By: Abigail Miyamoto [name]   On: 04/27/2017 14:03   Mr Abdomen Mrcp Moise Boring Contast  Result Date: 04/27/2017 CLINICAL DATA:  Elevated liver function tests. EXAM: MRI ABDOMEN WITHOUT AND WITH CONTRAST  (INCLUDING MRCP) TECHNIQUE: Multiplanar multisequence MR imaging of the abdomen was performed both before and after the administration of intravenous contrast. Heavily T2-weighted images of the biliary and pancreatic ducts were obtained, and three-dimensional MRCP images were rendered by post processing. CONTRAST:  72mL MULTIHANCE GADOBENATE DIMEGLUMINE 529 MG/ML IV SOLN COMPARISON:  CT of 04/24/2017 FINDINGS: Mild-to-moderate motion degradation, primarily affecting the pre and postcontrast dynamic series. Lower chest: Trace bilateral pleural fluid.  Mild cardiomegaly. Hepatobiliary: Moderate hepatic steatosis. Hepatomegaly at 19.2 cm craniocaudal. Tiny right hepatic lobe cyst. No suspicious liver lesion. Cholecystectomy. No intrahepatic biliary duct dilatation. Normal common duct caliber, including at 9 mm on image 38/ series 5. No choledocholithiasis. Pancreas: Pancreas divisum, with the dorsal duct entering the duodenum on image 38/series 4 and image 40/series 5. No pancreatic duct dilatation or acute pancreatitis. Spleen:  Normal in size, without focal abnormality. Adrenals/Urinary Tract: Normal adrenal glands. Normal kidneys, without hydronephrosis. Stomach/Bowel: Normal stomach and abdominal bowel loops. Vascular/Lymphatic: Normal caliber of the aorta and branch vessels. Multiple left renal arteries. No retroperitoneal or retrocrural adenopathy. Other:  No ascites. Musculoskeletal: No acute osseous abnormality. IMPRESSION: 1. Motion degraded exam. 2. Hepatic steatosis and hepatomegaly. 3. Cholecystectomy without biliary duct dilatation. No evidence of choledocholithiasis. 4. Pancreas divisum. Electronically Signed   By: Abigail Miyamoto [name]   On: 04/27/2017 14:03   Scheduled Meds: . buPROPion  300 mg Oral Daily  . enoxaparin (LOVENOX) injection  50 mg Subcutaneous Q24H  . pantoprazole (PROTONIX) IV  40 mg Intravenous Q12H  . potassium chloride  40 mEq Oral Once   Continuous Infusions: . sodium chloride  125 mL/hr at 04/28/17 0457   PRN Meds:.acetaminophen, cyclobenzaprine, ondansetron (ZOFRAN) IV, oxyCODONE-acetaminophen, promethazine   ASSESMENT:   *  N/V, epigastric pain.  Elevated LFTs steadily improved.  Clinically better. Smooth muscle Ab, ANA, IgG all negative.  Fatty liver, hepatomegaly, pancreas divisum without pancreatitis on MRCP.  Cholecystectomy in 1990s.   sxs and lab abnormalities felt to be due to viral illness.     *  Hypokalemia.     PLAN   *  Ordered 40 meq PO potassium.  Switch to once daily oral PPI.      Azucena Freed  04/28/2017, 9:24 AM Pager: [phone]

## 2017-04-29 ENCOUNTER — Telehealth: Payer: Self-pay

## 2017-04-29 LAB — BASIC METABOLIC PANEL
Anion gap: 8 (ref 5–15)
BUN: 10 mg/dL (ref 6–20)
CO2: 27 mmol/L (ref 22–32)
Calcium: 8.7 mg/dL — ABNORMAL LOW (ref 8.9–10.3)
Chloride: 107 mmol/L (ref 101–111)
Creatinine, Ser: 0.73 mg/dL (ref 0.44–1.00)
GFR calc Af Amer: >60 mL/min (ref >60)
GFR calc non Af Amer: >60 mL/min (ref >60)
Glucose, Bld: 135 mg/dL — ABNORMAL HIGH (ref 65–99)
Potassium: 3.4 mmol/L — ABNORMAL LOW (ref 3.5–5.1)
Sodium: 142 mmol/L (ref 135–145)

## 2017-04-29 MED ORDER — PANTOPRAZOLE SODIUM 40 MG PO TBEC
40.0000 mg | DELAYED_RELEASE_TABLET | Freq: Every day | ORAL | 0 refills | Status: DC
Start: 2017-04-30 — End: 2017-05-31

## 2017-04-29 MED ORDER — POLYETHYLENE GLYCOL 3350 17 G PO PACK: 17.0000 g | PACK | Freq: Every day | ORAL | 0 refills | Status: DC

## 2017-04-29 MED ORDER — PROMETHAZINE HCL 12.5 MG PO TABS: 12.5000 mg | ORAL_TABLET | Freq: Four times a day (QID) | ORAL | 0 refills | Status: DC | PRN

## 2017-04-29 MED ORDER — POLYETHYLENE GLYCOL 3350 17 G PO PACK: 17.0000 g | PACK | Freq: Two times a day (BID) | ORAL | Status: DC

## 2017-04-29 NOTE — Telephone Encounter (Signed)
Dr Ardis Hughs you have no appt's available in 2-4 weeks your first available is July.  Do you want me to double book?

## 2017-04-29 NOTE — Telephone Encounter (Signed)
-----   Message from Jerene Bears, MD sent at 04/28/2017 12:50 PM EDT ----- Regarding: Office followup Patient of [place] and discharging soon Needs office follow-up in 2-4 weeks JMP

## 2017-04-29 NOTE — Telephone Encounter (Signed)
Please put her on my wait list and book with extender for now.  Thanks

## 2017-04-29 NOTE — Telephone Encounter (Signed)
Left message on machine to call back appt on 05/13/17 at 830 am with Tye Savoy

## 2017-04-29 NOTE — Discharge Summary (Signed)
Physician Discharge Summary  [name] EYC:[id number] DOB: 1966/05/18 DOA: 04/24/2017  PCP: Jearld Fenton, NP  Admit date: 04/24/2017 Discharge date: 04/29/2017  Admitted From: Home Disposition: Home  Recommendations for Outpatient Follow-up:  1. Follow up with PCP in 1 week 2. Follow-up with Dr. Ardis Hughs in 1 week 3. Follow-up with general surgery 4. Bowel movement needed 5. Repeat CMP to ensure resolution of elevated transaminases 6. Please follow up on the following pending results: None  Home Health: None Equipment/Devices: None  Discharge Condition: Stable CODE STATUS: Full code Diet recommendation: Regular diet   Brief/Interim Summary:  Admission HPI written by Etta Quill, DO   Chief Complaint: Abdominal pain  HPI: [name] is a 51 y.o. female with medical history significant of hernia repair with mesh back in the early 2000s.  Patient presents to the ED with c/o sudden onset, gradually worsening, epigastric abdominal pain.  Symptoms onset initially at 7am, worsened around 4pm.  Pain is 10/10, waxing and waning, squeezing quality.  Over epigastric area with radiation to RUQ and LUQ.  Pain is worse with any palpation.  Nothing seems to make pain better.  Having nausea, and some vomiting.   ED Course: CT scan shows small hiatal hernia, shows ventral hernia that is either fat or omentum containing, uncomplicated mild diverticulosis, and no other acute pathology.    Hospital course:  Epigastric pain Unknown etiology. Patient had associated increasing in AST, ALT, alkaline phosphatase. CT scan of her abdomen was significant for mild hepatic steatosis, small hiatal hernia, ventral midline hernia and diverticulosis. MRCP was obtained and was significant for hepatomegaly, pancreas divisum. She has a history of cholecystectomy. This is possibly secondary to acute viral illness. She had one-time fever was called for Zosyn until blood cultures were negative for 48  hours.  Ventral hernia Evaluated by general surgery. Can follow-up outpatient  Nausea and vomiting Improved with antiemetics and time.   Headache Improved with analgesics.   Constipation Miralax. Likely secondary to opiates.  Discharge Diagnoses:  Principal Problem:   Abdominal pain, acute, epigastric Active Problems:   Ventral hernia without obstruction or gangrene   Nausea & vomiting   Elevated LFTs    Discharge Instructions  Discharge Instructions    Call MD for:  extreme fatigue    Complete by:  As directed    Call MD for:  persistant dizziness or light-headedness    Complete by:  As directed    Call MD for:  persistant nausea and vomiting    Complete by:  As directed    Call MD for:  severe uncontrolled pain    Complete by:  As directed      Allergies as of 04/29/2017      Reactions   Desvenlafaxine Other (See Comments)   REACTION: withdrawl   Iohexol     Code: RASH, Desc: PT STATES BACK IN THE 70S SHE HAD IV DYE REACTION.04/30/07/RM---pt given omnipaque 300 w/out pre meds; no complications 1/49/70 slg, Onset Date: [id number]      Medication List    STOP taking these medications   ibuprofen 200 MG tablet Commonly known as:  ADVIL,MOTRIN     TAKE these medications   buPROPion 300 MG 24 hr tablet Commonly known as:  WELLBUTRIN XL Take 1 tablet (300 mg total) by mouth daily.   cholecalciferol 1000 units tablet Commonly known as:  VITAMIN D Take 1,000 Units by mouth daily.   FISH OIL PO Take 1 capsule by mouth daily.  multivitamin with minerals Tabs tablet Take 1 tablet by mouth daily.   pantoprazole 40 MG tablet Commonly known as:  PROTONIX Take 1 tablet (40 mg total) by mouth daily at 6 (six) AM. Start taking on:  04/30/2017   polyethylene glycol packet Commonly known as:  MIRALAX / GLYCOLAX Take 17 g by mouth daily. Start taking on:  04/30/2017      Follow-up Information    Jearld Fenton, NP. Schedule an appointment as soon as possible  for a visit in 1 week(s).   Specialty:  Internal Medicine Why:  For recheck of symptoms Contact information: [place] [address] [phone]        Surgery, [place]. Schedule an appointment as soon as possible for a visit in 1 week(s).   Specialty:  General Surgery Why:  For recheck of symptoms Contact information: [address] [place] [address] [phone]        Milus Banister, MD. Schedule an appointment as soon as possible for a visit in 1 week(s).   Specialty:  Gastroenterology Contact information: [address] [address] [phone]          Allergies  Allergen Reactions  . Desvenlafaxine Other (See Comments)    REACTION: withdrawl  . Iohexol      Code: RASH, Desc: PT STATES BACK IN THE 70S SHE HAD IV DYE REACTION.04/30/07/RM---pt given omnipaque 300 w/out pre meds; no complications 9/62/95 slg, Onset Date: [id number]     Consultations:  Gastroenterology  General surgery   Procedures/Studies: Ct Abdomen Pelvis W Contrast  Result Date: 04/24/2017 CLINICAL DATA:  Diffuse abdominal pain for 4 hours, accompanied by nausea and vomiting. EXAM: CT ABDOMEN AND PELVIS WITH CONTRAST TECHNIQUE: Multidetector CT imaging of the abdomen and pelvis was performed using the standard protocol following bolus administration of intravenous contrast. CONTRAST:  145mL ISOVUE-300 IOPAMIDOL (ISOVUE-300) INJECTION 61% COMPARISON:  08/24/2016 FINDINGS: Lower chest: No acute abnormality. Hepatobiliary: Cholecystectomy. Mild fatty infiltration of the liver without focal lesion. Normal bile ducts. Pancreas: Unremarkable. No pancreatic ductal dilatation or surrounding inflammatory changes. Spleen: Normal in size without focal abnormality. Adrenals/Urinary Tract: Adrenal glands are unremarkable. Kidneys are normal, without renal calculi, focal lesion, or hydronephrosis. Bladder is unremarkable. Stomach/Bowel: Small hiatal  hernia. Stomach and small bowel are otherwise unremarkable. Normal appendix. Mild uncomplicated diverticulosis. No bowel obstruction. No focal inflammation of bowel. Vascular/Lymphatic: No significant vascular findings are present. No enlarged abdominal or pelvic lymph nodes. Reproductive: Status post hysterectomy. No adnexal masses. Other: Midline ventral hernia just above the umbilicus, containing fat or omentum. No acute inflammation or abnormal fluid collection associated with the hernia. Musculoskeletal: No significant skeletal lesion. IMPRESSION: 1. Mild hepatic steatosis. 2. Small hiatal hernia. 3. Uncomplicated colonic diverticulosis. 4. Midline ventral hernia containing fat or omentum. Electronically Signed   By: Andreas Newport [name]   On: 04/24/2017 21:14   Mr 3d Recon At Scanner  Result Date: 04/27/2017 CLINICAL DATA:  Elevated liver function tests. EXAM: MRI ABDOMEN WITHOUT AND WITH CONTRAST (INCLUDING MRCP) TECHNIQUE: Multiplanar multisequence MR imaging of the abdomen was performed both before and after the administration of intravenous contrast. Heavily T2-weighted images of the biliary and pancreatic ducts were obtained, and three-dimensional MRCP images were rendered by post processing. CONTRAST:  32mL MULTIHANCE GADOBENATE DIMEGLUMINE 529 MG/ML IV SOLN COMPARISON:  CT of 04/24/2017 FINDINGS: Mild-to-moderate motion degradation, primarily affecting the pre and postcontrast dynamic series. Lower chest: Trace bilateral pleural fluid.  Mild  cardiomegaly. Hepatobiliary: Moderate hepatic steatosis. Hepatomegaly at 19.2 cm craniocaudal. Tiny right hepatic lobe cyst. No suspicious liver lesion. Cholecystectomy. No intrahepatic biliary duct dilatation. Normal common duct caliber, including at 9 mm on image 38/ series 5. No choledocholithiasis. Pancreas: Pancreas divisum, with the dorsal duct entering the duodenum on image 38/series 4 and image 40/series 5. No pancreatic duct dilatation or acute  pancreatitis. Spleen:  Normal in size, without focal abnormality. Adrenals/Urinary Tract: Normal adrenal glands. Normal kidneys, without hydronephrosis. Stomach/Bowel: Normal stomach and abdominal bowel loops. Vascular/Lymphatic: Normal caliber of the aorta and branch vessels. Multiple left renal arteries. No retroperitoneal or retrocrural adenopathy. Other:  No ascites. Musculoskeletal: No acute osseous abnormality. IMPRESSION: 1. Motion degraded exam. 2. Hepatic steatosis and hepatomegaly. 3. Cholecystectomy without biliary duct dilatation. No evidence of choledocholithiasis. 4. Pancreas divisum. Electronically Signed   By: Abigail Miyamoto [name]   On: 04/27/2017 14:03   Dg Chest Port 1 View  Result Date: 04/26/2017 CLINICAL DATA:  Fever EXAM: PORTABLE CHEST 1 VIEW COMPARISON:  February 22, 2007 FINDINGS: There is no edema or consolidation. Heart is mildly prominent with pulmonary vascularity within normal limits. No adenopathy. No bone lesions. IMPRESSION: Mild cardiac prominence.  No edema or consolidation. Electronically Signed   By: Lowella Grip III [name]   On: 04/26/2017 07:11   Mr Abdomen Mrcp Moise Boring Contast  Result Date: 04/27/2017 CLINICAL DATA:  Elevated liver function tests. EXAM: MRI ABDOMEN WITHOUT AND WITH CONTRAST (INCLUDING MRCP) TECHNIQUE: Multiplanar multisequence MR imaging of the abdomen was performed both before and after the administration of intravenous contrast. Heavily T2-weighted images of the biliary and pancreatic ducts were obtained, and three-dimensional MRCP images were rendered by post processing. CONTRAST:  41mL MULTIHANCE GADOBENATE DIMEGLUMINE 529 MG/ML IV SOLN COMPARISON:  CT of 04/24/2017 FINDINGS: Mild-to-moderate motion degradation, primarily affecting the pre and postcontrast dynamic series. Lower chest: Trace bilateral pleural fluid.  Mild cardiomegaly. Hepatobiliary: Moderate hepatic steatosis. Hepatomegaly at 19.2 cm craniocaudal. Tiny right hepatic lobe cyst. No suspicious  liver lesion. Cholecystectomy. No intrahepatic biliary duct dilatation. Normal common duct caliber, including at 9 mm on image 38/ series 5. No choledocholithiasis. Pancreas: Pancreas divisum, with the dorsal duct entering the duodenum on image 38/series 4 and image 40/series 5. No pancreatic duct dilatation or acute pancreatitis. Spleen:  Normal in size, without focal abnormality. Adrenals/Urinary Tract: Normal adrenal glands. Normal kidneys, without hydronephrosis. Stomach/Bowel: Normal stomach and abdominal bowel loops. Vascular/Lymphatic: Normal caliber of the aorta and branch vessels. Multiple left renal arteries. No retroperitoneal or retrocrural adenopathy. Other:  No ascites. Musculoskeletal: No acute osseous abnormality. IMPRESSION: 1. Motion degraded exam. 2. Hepatic steatosis and hepatomegaly. 3. Cholecystectomy without biliary duct dilatation. No evidence of choledocholithiasis. 4. Pancreas divisum. Electronically Signed   By: Abigail Miyamoto [name]   On: 04/27/2017 14:03      Subjective: Patient reports some abdominal cramping. No bowel movement recently.   Discharge Exam: Vitals:   04/28/17 2153 04/29/17 0625  BP: 108/67 130/67  Pulse: 87 73  Resp: 16 16  Temp: 98.3 F (36.8 C) 98.1 F (36.7 C)   Vitals:   04/28/17 0638 04/28/17 1400 04/28/17 2153 04/29/17 0625  BP: 128/67 133/84 108/67 130/67  Pulse: 74 76 87 73  Resp: 20 16 16 16   Temp: 97.8 F (36.6 C) 98.3 F (36.8 C) 98.3 F (36.8 C) 98.1 F (36.7 C)  TempSrc: Oral Oral Oral Oral  SpO2: 97% 99% 98% 97%  Weight:      Height:  General: Pt is alert, awake, not in acute distress Cardiovascular: RRR, S1/S2 +, no rubs, no gallops Respiratory: CTA bilaterally, no wheezing, no rhonchi Abdominal: Soft, NT, ND, bowel sounds + Extremities: no edema, no cyanosis    The results of significant diagnostics from this hospitalization (including imaging, microbiology, ancillary and laboratory) are listed below for  reference.     Microbiology: Recent Results (from the past 240 hour(s))  Culture, blood (routine x 2)     Status: None (Preliminary result)   Collection Time: 04/26/17  4:38 AM  Result Value Ref Range Status   Specimen Description BLOOD RIGHT ARM  Final   Special Requests   Final    BOTTLES DRAWN AEROBIC ONLY Blood Culture adequate volume   Culture   Final    NO GROWTH 2 DAYS Performed at [facility], [address], [place], [place] [address]    Report Status PENDING  Incomplete  Culture, blood (routine x 2)     Status: None (Preliminary result)   Collection Time: 04/26/17  4:46 AM  Result Value Ref Range Status   Specimen Description BLOOD LEFT HAND  Final   Special Requests IN PEDIATRIC BOTTLE Blood Culture adequate volume  Final   Culture   Final    NO GROWTH 2 DAYS Performed at [facility], [place] [address] [place], [place] [address]    Report Status PENDING  Incomplete     Labs: BNP (last 3 results) No results for input(s): BNP in the last 8760 hours. Basic Metabolic Panel:  Recent Labs Lab 04/25/17 0457 04/26/17 0438 04/27/17 0433 04/28/17 0810 04/29/17 0400  NA 139 136 140 140 142  K 4.2 3.6 3.4* 2.9* 3.4*  CL 108 108 106 107 107  CO2 23 22 28 26 27   GLUCOSE 143* 107* 103* 111* 135*  BUN 15 8 6 7 10   CREATININE 0.87 0.75 0.79 0.69 0.73  CALCIUM 8.1* 7.6* 8.1* 8.0* 8.7*   Liver Function Tests:  Recent Labs Lab 04/24/17 1921 04/25/17 0457 04/26/17 0438 04/27/17 0433 04/28/17 0810  AST 22 57* 147* 59* 33  ALT 21 59* 158* 108* 74*  ALKPHOS 101 90 146* 135* 115  BILITOT 0.4 0.7 0.7 0.6 0.2*  PROT 8.3* 6.8 6.2* 6.6 6.3*  ALBUMIN 4.3 3.5 3.0* 3.0* 2.9*    Recent Labs Lab 04/24/17 1921  LIPASE 31   No results for input(s): AMMONIA in the last 168 hours. CBC:  Recent Labs Lab 04/24/17 1922 04/25/17 0457 04/27/17 0433  WBC 12.1* 7.1 4.4  NEUTROABS 10.1*  --   --   HGB 15.2* 13.6 11.5*  HCT 43.9 40.0 34.3*  MCV 84.1 85.1  85.3  PLT 289 195 157   Cardiac Enzymes: No results for input(s): CKTOTAL, CKMB, CKMBINDEX, TROPONINI in the last 168 hours. BNP: Invalid input(s): POCBNP CBG: No results for input(s): GLUCAP in the last 168 hours. D-Dimer No results for input(s): DDIMER in the last 72 hours. Hgb A1c No results for input(s): HGBA1C in the last 72 hours. Lipid Profile No results for input(s): CHOL, HDL, LDLCALC, TRIG, CHOLHDL, LDLDIRECT in the last 72 hours. Thyroid function studies No results for input(s): TSH, T4TOTAL, T3FREE, THYROIDAB in the last 72 hours.  Invalid input(s): FREET3 Anemia work up No results for input(s): VITAMINB12, FOLATE, FERRITIN, TIBC, IRON, RETICCTPCT in the last 72 hours. Urinalysis    Component Value Date/Time   COLORURINE YELLOW 04/24/2017 2249   APPEARANCEUR CLEAR 04/24/2017 2249   LABSPEC >1.046 (H)  04/24/2017 2249   PHURINE 6.0 04/24/2017 2249   GLUCOSEU NEGATIVE 04/24/2017 2249   HGBUR MODERATE (A) 04/24/2017 2249   HGBUR 1+ 03/30/2009 1518   BILIRUBINUR NEGATIVE 04/24/2017 2249   BILIRUBINUR n 01/23/2017 [place] 04/24/2017 2249   PROTEINUR NEGATIVE 04/24/2017 2249   UROBILINOGEN negative 01/23/2017 1629   UROBILINOGEN 0.2 04/11/2015 1047   NITRITE NEGATIVE 04/24/2017 2249   LEUKOCYTESUR NEGATIVE 04/24/2017 2249   Sepsis Labs Invalid input(s): PROCALCITONIN,  WBC,  LACTICIDVEN Microbiology Recent Results (from the past 240 hour(s))  Culture, blood (routine x 2)     Status: None (Preliminary result)   Collection Time: 04/26/17  4:38 AM  Result Value Ref Range Status   Specimen Description BLOOD RIGHT ARM  Final   Special Requests   Final    BOTTLES DRAWN AEROBIC ONLY Blood Culture adequate volume   Culture   Final    NO GROWTH 2 DAYS Performed at [facility], [place] [address], [place], [place] [address]    Report Status PENDING  Incomplete  Culture, blood (routine x 2)     Status: None (Preliminary result)   Collection Time:  04/26/17  4:46 AM  Result Value Ref Range Status   Specimen Description BLOOD LEFT HAND  Final   Special Requests IN PEDIATRIC BOTTLE Blood Culture adequate volume  Final   Culture   Final    NO GROWTH 2 DAYS Performed at [facility], [place] [address], [place], [place] [address]    Report Status PENDING  Incomplete     Time coordinating discharge: Over 30 minutes  SIGNED:   Cordelia Poche, MD [facility] 04/29/2017, 12:22 PM Pager [phone]  If 7PM-7AM, please contact night-coverage [email or web address] Password [email or web address]

## 2017-04-30 NOTE — Telephone Encounter (Signed)
The pt has been given the appt information for [facility]  She verbalized understanding

## 2017-05-01 ENCOUNTER — Telehealth: Payer: Self-pay

## 2017-05-01 LAB — CULTURE, BLOOD (ROUTINE X 2)
Culture: NO GROWTH
Culture: NO GROWTH
Special Requests: ADEQUATE
Special Requests: ADEQUATE

## 2017-05-01 NOTE — Telephone Encounter (Signed)
Sunday Spillers nurse case mgr with [employer] [place] left v/m introducing herself as pts case mgr and will be available to help with pt care. FYI to Avie Echevaria NP.

## 2017-05-01 NOTE — Telephone Encounter (Signed)
noted 

## 2017-05-06 ENCOUNTER — Encounter: Payer: Self-pay | Admitting: Internal Medicine

## 2017-05-06 ENCOUNTER — Ambulatory Visit (INDEPENDENT_AMBULATORY_CARE_PROVIDER_SITE_OTHER): Payer: BLUE CROSS/BLUE SHIELD | Admitting: Internal Medicine

## 2017-05-06 VITALS — BP 134/74 | HR 89 | Temp 98.3°F | Wt 234.5 lb

## 2017-05-06 DIAGNOSIS — R1013 Epigastric pain: Secondary | ICD-10-CM | POA: Diagnosis not present

## 2017-05-06 DIAGNOSIS — R112 Nausea with vomiting, unspecified: Secondary | ICD-10-CM | POA: Diagnosis not present

## 2017-05-06 DIAGNOSIS — R140 Abdominal distension (gaseous): Secondary | ICD-10-CM | POA: Diagnosis not present

## 2017-05-06 DIAGNOSIS — R748 Abnormal levels of other serum enzymes: Secondary | ICD-10-CM

## 2017-05-06 LAB — COMPREHENSIVE METABOLIC PANEL
ALT: 46 U/L — ABNORMAL HIGH (ref 0–35)
AST: 22 U/L (ref 0–37)
Albumin: 4.2 g/dL (ref 3.5–5.2)
Alkaline Phosphatase: 101 U/L (ref 39–117)
BUN: 13 mg/dL (ref 6–23)
CO2: 29 mEq/L (ref 19–32)
Calcium: 9.5 mg/dL (ref 8.4–10.5)
Chloride: 102 mEq/L (ref 96–112)
Creatinine, Ser: 0.81 mg/dL (ref 0.40–1.20)
GFR: 79.07 mL/min (ref >60.00)
Glucose, Bld: 126 mg/dL — ABNORMAL HIGH (ref 70–99)
Potassium: 4.1 mEq/L (ref 3.5–5.1)
Sodium: 138 mEq/L (ref 135–145)
Total Bilirubin: 0.3 mg/dL (ref 0.2–1.2)
Total Protein: 7.6 g/dL (ref 6.0–8.3)

## 2017-05-06 LAB — H. PYLORI ANTIBODY, IGG: H Pylori IgG: NEGATIVE

## 2017-05-06 NOTE — Patient Instructions (Signed)
Food Choices for Gastroesophageal Reflux Disease, Adult When you have gastroesophageal reflux disease (GERD), the foods you eat and your eating habits are very important. Choosing the right foods can help ease your discomfort. What guidelines do I need to follow?  Choose fruits, vegetables, whole grains, and low-fat dairy products.  Choose low-fat meat, fish, and poultry.  Limit fats such as oils, salad dressings, butter, nuts, and avocado.  Keep a food diary. This helps you identify foods that cause symptoms.  Avoid foods that cause symptoms. These may be different for everyone.  Eat small meals often instead of 3 large meals a day.  Eat your meals slowly, in a place where you are relaxed.  Limit fried foods.  Cook foods using methods other than frying.  Avoid drinking alcohol.  Avoid drinking large amounts of liquids with your meals.  Avoid bending over or lying down until 2-3 hours after eating. What foods are not recommended? These are some foods and drinks that may make your symptoms worse: Vegetables  Tomatoes. Tomato juice. Tomato and spaghetti sauce. Chili peppers. Onion and garlic. Horseradish. Fruits  Oranges, grapefruit, and lemon (fruit and juice). Meats  High-fat meats, fish, and poultry. This includes hot dogs, ribs, ham, sausage, salami, and bacon. Dairy  Whole milk and chocolate milk. Sour cream. Cream. Butter. Ice cream. Cream cheese. Drinks  Coffee and tea. Bubbly (carbonated) drinks or energy drinks. Condiments  Hot sauce. Barbecue sauce. Sweets/Desserts  Chocolate and cocoa. Donuts. Peppermint and spearmint. Fats and Oils  High-fat foods. This includes French fries and potato chips. Other  Vinegar. Strong spices. This includes black pepper, white pepper, red pepper, cayenne, curry powder, cloves, ginger, and chili powder. The items listed above may not be a complete list of foods and drinks to avoid. Contact your dietitian for more information.    This information is not intended to replace advice given to you by your health care provider. Make sure you discuss any questions you have with your health care provider. Document Released: 06/10/2012 Document Revised: 05/17/2016 Document Reviewed: 10/14/2013 Elsevier Interactive Patient Education  2017 [employer]  

## 2017-05-06 NOTE — Progress Notes (Signed)
Subjective:    Patient ID: [name], female    DOB: [birth year], 51 y.o.   MRN: [id number]  HPI  Pt presents to the clinic today for ER follow up for nausea, vomiting and epigastric pain. CT scan of the abdomen showed a small hiatal hernia and ventral hernia. Liver enzymes were mildly elevated. MRCP showed enlarged liver with pancreatic divisim. No emergency surgery was needed. They felt like this was related to a viral illness. She was discharged and advised to follow up with PCP, GI and General Surgery. She has not had any nausea, vomiting or epigastric pain since discharge. She did have some abdominal bloating. She denies constipation, diarrhea or blood in her stool. She has been taking the Protonix as prescribed. She has a follow up with surgery on Thursday and GI on Monday.   Review of Systems      Past Medical History:  Diagnosis Date  . Anxiety   . Depression   . GERD (gastroesophageal reflux disease)   . History of abnormal cervical Pap smear    1991 -- 1995--hx cryotherapy to cervix  . History of colon polyps    2008- BENIGN  . Hydronephrosis, left   . Kidney stones 2016  . PONV (postoperative nausea and vomiting)     Current Outpatient Prescriptions  Medication Sig Dispense Refill  . buPROPion (WELLBUTRIN XL) 300 MG 24 hr tablet Take 1 tablet (300 mg total) by mouth daily. 90 tablet 0  . cholecalciferol (VITAMIN D) 1000 units tablet Take 1,000 Units by mouth daily.    . Multiple Vitamin (MULTIVITAMIN WITH MINERALS) TABS tablet Take 1 tablet by mouth daily.    . Omega-3 Fatty Acids (FISH OIL PO) Take 1 capsule by mouth daily.    . pantoprazole (PROTONIX) 40 MG tablet Take 1 tablet (40 mg total) by mouth daily at 6 (six) AM. 30 tablet 0   No current facility-administered medications for this visit.     Allergies  Allergen Reactions  . Desvenlafaxine Other (See Comments)    REACTION: withdrawl  . Iohexol      Code: RASH, Desc: PT STATES BACK IN THE 70S SHE HAD  IV DYE REACTION.04/30/07/RM---pt given omnipaque 300 w/out pre meds; no complications 05/02/31 slg, Onset Date: [id number]     Family History  Problem Relation Age of Onset  . Hypertension Mother   . Diabetes Father   . Cancer Paternal Uncle        Pancreas  . Heart attack Maternal Grandmother   . Drug abuse Sister   . Colon cancer Neg Hx   . Esophageal cancer Neg Hx   . Stomach cancer Neg Hx   . Rectal cancer Neg Hx     Social History   Social History  . Marital status: Married    Spouse name: N/A  . Number of children: N/A  . Years of education: N/A   Occupational History  . Not on file.   Social History Main Topics  . Smoking status: Former Smoker    Years: 15.00    Types: Cigarettes    Quit date: 05/16/2008  . Smokeless tobacco: Never Used  . Alcohol use No  . Drug use: No  . Sexual activity: Yes    Partners: Male    Birth control/ protection: Surgical     Comment: Hyst   Other Topics Concern  . Not on file   Social History Narrative  . No narrative on file     Constitutional: Denies fever,  malaise, fatigue, headache or abrupt weight changes.  Gastrointestinal: Pt reports abdominal bloating. Denies abdominal pain, constipation, diarrhea or blood in the stool.    No other specific complaints in a complete review of systems (except as listed in HPI above).  Objective:   Physical Exam   BP 134/74   Pulse 89   Temp 98.3 F (36.8 C) (Oral)   Wt 234 lb 8 oz (106.4 kg)   LMP 12/24/2006 (Approximate)   SpO2 98%   BMI 37.85 kg/m  Wt Readings from Last 3 Encounters:  05/06/17 234 lb 8 oz (106.4 kg)  04/25/17 234 lb (106.1 kg)  04/27/17 234 lb (106.1 kg)    General: Appears her stated age, obese in NAD. Cardiovascular: Normal rate and rhythm.  Pulmonary/Chest: Normal effort and positive vesicular breath sounds. No respiratory distress. No wheezes, rales or ronchi noted.  Abdomen: Soft and nontender. Normal bowel sounds. No distention or masses noted.      BMET    Component Value Date/Time   NA 142 04/29/2017 0400   K 3.4 (L) 04/29/2017 0400   CL 107 04/29/2017 0400   CO2 27 04/29/2017 0400   GLUCOSE 135 (H) 04/29/2017 0400   BUN 10 04/29/2017 0400   CREATININE 0.73 04/29/2017 0400   CALCIUM 8.7 (L) 04/29/2017 0400   GFRNONAA >60 04/29/2017 0400   GFRAA >60 04/29/2017 0400    Lipid Panel     Component Value Date/Time   CHOL 175 03/18/2017 0954   TRIG 107.0 03/18/2017 0954   HDL 45.60 03/18/2017 0954   CHOLHDL 4 03/18/2017 0954   VLDL 21.4 03/18/2017 0954   LDLCALC 108 (H) 03/18/2017 0954    CBC    Component Value Date/Time   WBC 4.4 04/27/2017 0433   RBC 4.02 04/27/2017 0433   HGB 11.5 (L) 04/27/2017 0433   HCT 34.3 (L) 04/27/2017 0433   PLT 157 04/27/2017 0433   MCV 85.3 04/27/2017 0433   MCH 28.6 04/27/2017 0433   MCHC 33.5 04/27/2017 0433   RDW 13.9 04/27/2017 0433   LYMPHSABS 1.3 04/24/2017 1922   MONOABS 0.7 04/24/2017 1922   EOSABS 0.0 04/24/2017 1922   BASOSABS 0.0 04/24/2017 1922    Hgb A1C Lab Results  Component Value Date   HGBA1C 5.8 03/18/2017           Assessment & Plan:   Hospital Followup for Nausea, Vomiting, Epigastric Pain and Elevated Liver Enzymes:  Hospital notes, labs and imaging reviewed Asymptomatic at this time Will check CMET and H Pylori today Continue Protonix for now She will follow up with surgery and GI as planned.   RTC as needed or if symptoms persist or worsen Guage Efferson, NP

## 2017-05-06 NOTE — Progress Notes (Signed)
Pre visit review using our clinic review tool, if applicable. No additional management support is needed unless otherwise documented below in the visit note. 

## 2017-05-13 ENCOUNTER — Ambulatory Visit (INDEPENDENT_AMBULATORY_CARE_PROVIDER_SITE_OTHER): Payer: BLUE CROSS/BLUE SHIELD | Admitting: Nurse Practitioner

## 2017-05-13 ENCOUNTER — Encounter: Payer: Self-pay | Admitting: Nurse Practitioner

## 2017-05-13 VITALS — BP 140/88 | HR 94 | Ht 66.0 in | Wt 235.0 lb

## 2017-05-13 DIAGNOSIS — E8889 Other specified metabolic disorders: Secondary | ICD-10-CM

## 2017-05-13 DIAGNOSIS — E7889 Other lipoprotein metabolism disorders: Secondary | ICD-10-CM | POA: Diagnosis not present

## 2017-05-13 DIAGNOSIS — R1010 Upper abdominal pain, unspecified: Secondary | ICD-10-CM | POA: Diagnosis not present

## 2017-05-13 DIAGNOSIS — R945 Abnormal results of liver function studies: Secondary | ICD-10-CM | POA: Diagnosis not present

## 2017-05-13 DIAGNOSIS — R160 Hepatomegaly, not elsewhere classified: Secondary | ICD-10-CM

## 2017-05-13 DIAGNOSIS — R7989 Other specified abnormal findings of blood chemistry: Secondary | ICD-10-CM

## 2017-05-13 NOTE — Patient Instructions (Addendum)
Complete the 30 days of the Pantoprazole Sodium 40 mg then discontinue. Please call us if you have any questions or problems.

## 2017-05-13 NOTE — Progress Notes (Signed)
     HPI: Patient is a 51 yo female known to Dr. Ardis Hughs. She has a hx of lap chole, umbilical hernia repair, lap bilateral salpingectomy and diagnostic laparoscopy x 2 in 1990's.  Patient was hospitalized earlier this month for sudden onset of epigastric pain, vomiting and elevated LFTs. Imaging revealed a ventral hernia, steatosis , hepatomegaly, pancreatic divisum but no evidence for pancreatitis. The ventral hernia was evaluated by surgery, given outpatient follow up. Viral hepatitis studies negative. ANA, IgG normal. Negative  H. Pylori IgG.Symptoms improved and LFTs normalized by time of discharge except for mildly elevated ALT.   Patient is here for hospital follow-up. She hasn't had any further significant upper abdominal pain. She does continue to have a pressure sensation in the epigastrium/right upper quadrant. Pressure is not related to eating nor movement, it is intermittent. No fever, nausea or vomiting. Not taking NSAIDS. She has continued the Protonix prescribed at discharge  No other GI complaints, bowel movements are normal. She scheduled for screening colonoscopy late June   Past Medical History:  Diagnosis Date  . Anxiety   . Depression   . GERD (gastroesophageal reflux disease)   . History of abnormal cervical Pap smear    1991 -- 1995--hx cryotherapy to cervix  . History of colon polyps    2008- BENIGN  . Hydronephrosis, left   . Kidney stones 2016  . PONV (postoperative nausea and vomiting)     Patient's surgical history, family medical history, social history, medications and allergies were all reviewed in Epic    Physical Exam: BP 140/88   Pulse 94   Ht 5\' 6"  (1.676 m)   Wt 235 lb (106.6 kg)   LMP 12/24/2006 (Approximate)   BMI 37.93 kg/m   GENERAL:Obese white female in NAD PSYCH: :Pleasant, cooperative, normal affect EENT:  conjunctiva pink, mucous membranes moist, neck supple without masses CARDIAC:  RRR, no murmur heard, no peripheral edema PULM:  Normal respiratory effort, lungs CTA bilaterally, no wheezing ABDOMEN:  soft, mild mid upper abdominal tenderness. .No obvious masses,   normal bowel sounds SKIN:  turgor, no lesions seen Musculoskeletal:  Normal muscle tone, normal strength NEURO: Alert and oriented x 3, no focal neurologic deficits   ASSESSMENT and PLAN:  24.  51 year old female recently hospitalized with fever, upper abdominal pain and transaminitis (150 range). Acute viral hepatitis studies negative. No evidence for autoimmune hepatitis. She is status post remote cholecystectomy. MRCP negative for choledocholithiasis with normal appearing intra / extrahepatic ducts, pancreatic divisum, steatosis, hepatomegaly (19 cm), and a ventral hernia. Etiology of symptoms undetermined. LFTs have normalized except for mildly elevated ALT which is probably related to underlying steatosis. No significant abdominal pain now just intermittent pressure unrelated to meals or activity. Patient has an appointment with surgery tomorrow to follow-up on ventral hernia.  -Advised patient to complete the 30 day course of PPI then can discontinue. If she has recurrent epigastric pain will restart PPI and call us for stat labs.  -Briefly discussed steatosis including risk factors and management including weight loss, normalization of triglyceride levels. Recommend she have LFTs checked every 6 to 12 months in light of steatosis and risk factors for NASH  2. Colon cancer screening, already scheduled for colonoscopy late June   Bronco Mcgrory , NP 05/13/2017, 8:48 AM

## 2017-05-14 DIAGNOSIS — K429 Umbilical hernia without obstruction or gangrene: Secondary | ICD-10-CM | POA: Diagnosis not present

## 2017-05-15 ENCOUNTER — Encounter: Payer: Self-pay | Admitting: Internal Medicine

## 2017-05-15 NOTE — Progress Notes (Signed)
I agree with the above note, plan 

## 2017-05-27 ENCOUNTER — Inpatient Hospital Stay: Admission: RE | Admit: 2017-05-27 | Payer: Self-pay | Source: Ambulatory Visit

## 2017-05-27 DIAGNOSIS — J188 Other pneumonia, unspecified organism: Secondary | ICD-10-CM | POA: Diagnosis not present

## 2017-05-31 ENCOUNTER — Ambulatory Visit (AMBULATORY_SURGERY_CENTER): Payer: Self-pay | Admitting: *Deleted

## 2017-05-31 ENCOUNTER — Emergency Department (HOSPITAL_COMMUNITY)
Admission: EM | Admit: 2017-05-31 | Discharge: 2017-06-01 | Disposition: A | Discharge status: Home / Self Care | Payer: BLUE CROSS/BLUE SHIELD | Attending: Emergency Medicine | Admitting: Emergency Medicine

## 2017-05-31 ENCOUNTER — Emergency Department (HOSPITAL_COMMUNITY): Payer: BLUE CROSS/BLUE SHIELD

## 2017-05-31 ENCOUNTER — Other Ambulatory Visit: Payer: Self-pay

## 2017-05-31 ENCOUNTER — Encounter (HOSPITAL_COMMUNITY): Payer: Self-pay | Admitting: Emergency Medicine

## 2017-05-31 VITALS — Ht 69.0 in | Wt 236.4 lb

## 2017-05-31 DIAGNOSIS — R079 Chest pain, unspecified: Secondary | ICD-10-CM | POA: Diagnosis not present

## 2017-05-31 DIAGNOSIS — Z87891 Personal history of nicotine dependence: Secondary | ICD-10-CM | POA: Diagnosis not present

## 2017-05-31 DIAGNOSIS — Z79899 Other long term (current) drug therapy: Secondary | ICD-10-CM | POA: Diagnosis not present

## 2017-05-31 DIAGNOSIS — R0789 Other chest pain: Secondary | ICD-10-CM | POA: Diagnosis not present

## 2017-05-31 DIAGNOSIS — Z1211 Encounter for screening for malignant neoplasm of colon: Secondary | ICD-10-CM

## 2017-05-31 DIAGNOSIS — R0602 Shortness of breath: Secondary | ICD-10-CM | POA: Diagnosis not present

## 2017-05-31 LAB — POCT I-STAT TROPONIN I: Troponin i, poc: 0 ng/mL (ref 0.00–0.08)

## 2017-05-31 MED ORDER — SUPREP BOWEL PREP KIT 17.5-3.13-1.6 GM/177ML PO SOLN: 1.0000 | ORAL | 0 refills | Status: DC

## 2017-05-31 NOTE — ED Triage Notes (Signed)
Patient reports being diagnosed with pneumonia on Monday. Pt completed antibiotics today, but does not feel any better. Lung sounds clear.

## 2017-05-31 NOTE — ED Provider Notes (Signed)
[facility] DEPT Provider Note   CSN: [id number] Arrival date & time: 05/31/17  2153     History   Chief Complaint Chief Complaint  Patient presents with  . Pneumonia    HPI [name] is a 51 y.o. female.  HPI   Patient is a 51 year old female with history of anxiety, depression and GERD who presents complaint of chest pain. Patient reports over the past 2 weeks she has had an intermittent productive cough, nasal congestion and intermittent shortness of breath. She notes 4 days ago she went to an urgent care, was diagnosed with pneumonia and given prescriptions for azithromycin, prednisone and an antitussive. She reports her cough has improved however over the past 5 days she began having constant sharp pain to her right chest that radiated into her back. Denies radiation. She notes her pain is worse with coughing, taking a deep breath or any movement. She states she has been taking Tylenol and ibuprofen at home intermittently without relief. She continues to endorse mild shortness of breath due to pain with breathing. Denies fever, chills, headache, hemoptysis, palpitations, abdominal pain, nausea, vomiting, numbness, tingling, weakness. Patient denies history of similar chest pain in the past. She notes she was in the hospital last week caring for her husband who is in the ICU when her symptoms started. Patient otherwise denies any recent hospitalizations or surgeries, recent long car ride or airplane travel, history of cancer, leg swelling, use of exogenous hormones, hx of DVT/PE.   PCP- Dr. Garnette Gunner  Past Medical History:  Diagnosis Date  . Allergy   . Anxiety   . Depression   . GERD (gastroesophageal reflux disease)   . History of abnormal cervical Pap smear    1991 -- 1995--hx cryotherapy to cervix  . History of colon polyps    2008- BENIGN  . Hydronephrosis, left   . Kidney stones 2016  . PONV (postoperative nausea and vomiting)     Patient Active Problem List   Diagnosis Date Noted  . Ventral hernia without obstruction or gangrene 04/25/2017  . Adjustment disorder with anxiety 11/01/2014  . COLONIC POLYPS, HYPERPLASTIC 05/15/2008  . HEMORRHOIDS, INTERNAL 05/15/2008  . NEPHROLITHIASIS, HX OF 09/29/2007    Past Surgical History:  Procedure Laterality Date  . ABDOMINAL HYSTERECTOMY    . ANTERIOR AND POSTERIOR REPAIR WITH SACROSPINOUS FIXATION N/A 08/16/2015   Procedure: ANTERIOR COLPORRHAPHY WITH XENOFORM GRAFT AND SACROSPINOUS FIXATION;  Surgeon: Nunzio Cobbs, MD;  Location: [facility];  Service: Gynecology;  Laterality: N/A;  2.5 hours OR time  . BLADDER SUSPENSION N/A 08/16/2015   Procedure: TRANSVAGINAL TAPE (TVT) PROCEDURE exact midurethral sling;  Surgeon: Nunzio Cobbs, MD;  Location: [facility];  Service: Gynecology;  Laterality: N/A;  . COLONOSCOPY    . COLONOSCOPY W/ POLYPECTOMY  05-30-2007  . CYSTOSCOPY N/A 08/16/2015   Procedure: CYSTOSCOPY;  Surgeon: Nunzio Cobbs, MD;  Location: [facility];  Service: Gynecology;  Laterality: N/A;  . CYSTOSCOPY W/ RETROGRADES Bilateral 06/22/2015   Procedure: CYSTOSCOPY WITH RETROGRADE PYELOGRAM;  Surgeon: Cleon Gustin, MD;  Location: [facility];  Service: Urology;  Laterality: Bilateral;  . CYSTOSCOPY W/ URETERAL STENT PLACEMENT  02/  2000  . CYSTOSCOPY WITH HOLMIUM LASER LITHOTRIPSY Left 05/18/2015   Procedure: CYSTOSCOPY WITH HOLMIUM LASER LITHOTRIPSY;  Surgeon: Alexis Frock, MD;  Location: [facility];  Service: Urology;  Laterality: Left;  . CYSTOSCOPY WITH RETROGRADE PYELOGRAM, URETEROSCOPY AND STENT PLACEMENT Left 06/22/2015  Procedure: CYSTOSCOPY,  LEFT URETEROSCOPY;  Surgeon: Cleon Gustin, MD;  Location: [facility];  Service: Urology;  Laterality: Left;  . CYSTOSCOPY WITH URETEROSCOPY AND STENT PLACEMENT Left 05/18/2015   Procedure: CYSTOSCOPY WITH URETEROSCOPY, STONE MANIPULATION AND STENT PLACEMENT;  Surgeon:  Alexis Frock, MD;  Location: [facility];  Service: Urology;  Laterality: Left;  . DIAGNOSTIC LAPAROSCOPY    . DX LAPAROSCOPY  X2  . ESOPHAGOGASTRODUODENOSCOPY  05-09-2007  . EXPLORATORY LAPAROTOMY W/ BILATERAL SALPINGECTOMY AND REMOVAL RIGHT ECTOPIC PREG.  02-23-2004  . EXTRACORPOREAL SHOCK WAVE LITHOTRIPSY  2001  &  2002  . LAPAROSCOPIC CHOLECYSTECTOMY  1999  . POLYPECTOMY    . SHOULDER ARTHROSCOPY WITH OPEN ROTATOR CUFF REPAIR Right 2012  . TOTAL ABDOMINAL HYSTERECTOMY W/ BILATERAL OOPHORECTOMY AND LYSIS ADHESIONS  09-02-2007  . TUBAL LIGATION Bilateral 1995  . UMBILICAL HERNIA REPAIR  2003  . VAGINAL HYSTERECTOMY N/A 08/18/2015   Procedure: Exam under Anesthesia, Excision Xenform Graft, Removal Bilateral Sacrospinous Ligament sutures;  Surgeon: Nunzio Cobbs, MD;  Location: [facility];  Service: Gynecology;  Laterality: N/A;    OB History    Gravida Para Term Preterm AB Living   3 2 2   1 2    SAB TAB Ectopic Multiple Live Births       1           Home Medications    Prior to Admission medications   Medication Sig Start Date End Date Taking? Authorizing Provider  acetaminophen (TYLENOL) 500 MG tablet Take 1,000 mg by mouth every 6 (six) hours as needed for mild pain, moderate pain, fever or headache.   Yes [provider]  ALPRAZolam Duanne Moron) 0.5 MG tablet Take 0.5 mg by mouth at bedtime as needed for anxiety or sleep.    Yes [provider]  buPROPion (WELLBUTRIN XL) 300 MG 24 hr tablet Take 1 tablet (300 mg total) by mouth daily. Patient taking differently: Take 300 mg by mouth at bedtime.  02/20/17  Yes Jearld Fenton, NP  cholecalciferol (VITAMIN D) 1000 units tablet Take 1,000 Units by mouth daily.   Yes [provider]  Multiple Vitamin (MULTIVITAMIN WITH MINERALS) TABS tablet Take 1 tablet by mouth daily.   Yes [provider]  omega-3 acid ethyl esters (LOVAZA) 1 g capsule Take 1 g by mouth daily.   Yes [provider]  promethazine-dextromethorphan (PROMETHAZINE-DM) 6.25-15 MG/5ML syrup Take 5 mLs by mouth every 6 (six) hours as needed for cough.   Yes [provider]  naproxen (NAPROSYN) 500 MG tablet Take 1 tablet (500 mg total) by mouth 2 (two) times daily. 06/01/17   Nona Dell, PA-C    Family History Family History  Problem Relation Age of Onset  . Hypertension Mother   . Colon polyps Mother   . Diabetes Father   . Colon polyps Father   . Cancer Paternal Uncle        Pancreas  . Pancreatic cancer Paternal Uncle   . Heart attack Maternal Grandmother   . Drug abuse Sister   . Esophageal cancer Neg Hx   . Stomach cancer Neg Hx   . Rectal cancer Neg Hx   . Colon cancer Neg Hx     Social History Social History  Substance Use Topics  . Smoking status: Former Smoker    Years: 15.00    Types: Cigarettes    Quit date: 05/16/2008  . Smokeless tobacco: Never Used  .  Alcohol use No     Allergies   Desvenlafaxine and Iodinated diagnostic agents   Review of Systems Review of Systems  Respiratory: Positive for cough and shortness of breath.   Cardiovascular: Positive for chest pain.  All other systems reviewed and are negative.    Physical Exam Updated Vital Signs BP 118/73   Pulse 74   Temp 97.8 F (36.6 C)   Resp 18   LMP 12/24/2006 (Approximate)   SpO2 97%   Physical Exam  Constitutional: She is oriented to person, place, and time. She appears well-developed and well-nourished. No distress.  HENT:  Head: Normocephalic and atraumatic.  Mouth/Throat: Oropharynx is clear and moist. No oropharyngeal exudate.  Eyes: Conjunctivae and EOM are normal. Right eye exhibits no discharge. Left eye exhibits no discharge. No scleral icterus.  Neck: Normal range of motion. Neck supple.  Cardiovascular: Normal rate, regular rhythm, normal heart sounds and intact distal pulses.   Pulmonary/Chest: Effort normal and breath sounds normal. No respiratory  distress. She has no wheezes. She has no rales. She exhibits tenderness. She exhibits no laceration, no crepitus, no edema, no deformity, no swelling and no retraction.  Right anterior chest wall TTP  Abdominal: Soft. Bowel sounds are normal. She exhibits no distension and no mass. There is no tenderness. There is no rebound and no guarding.  Musculoskeletal: Normal range of motion. She exhibits no edema.  Neurological: She is alert and oriented to person, place, and time.  Skin: Skin is warm and dry. She is not diaphoretic.  Nursing note and vitals reviewed.    ED Treatments / Results  Labs (all labs ordered are listed, but only abnormal results are displayed) Labs Reviewed  CBC WITH DIFFERENTIAL/PLATELET - Abnormal; Notable for the following:       Result Value   Hemoglobin 11.9 (*)    HCT 34.6 (*)    All other components within normal limits  BASIC METABOLIC PANEL - Abnormal; Notable for the following:    Potassium 3.4 (*)    Glucose, Bld 111 (*)    Calcium 8.6 (*)    All other components within normal limits  D-DIMER, QUANTITATIVE (NOT AT [facility]) - Abnormal; Notable for the following:    D-Dimer, Quant 0.64 (*)    All other components within normal limits  POCT I-STAT TROPONIN I    EKG  EKG Interpretation None       Radiology Dg Chest 2 View  Result Date: 05/31/2017 CLINICAL DATA:  Pneumonia with worsening shortness of breath and weakness EXAM: CHEST  2 VIEW COMPARISON:  04/26/2017 FINDINGS: No acute infiltrate or effusion. Heart size upper limits of normal. No pneumothorax. IMPRESSION: No acute infiltrate or edema. Electronically Signed   By: Donavan Foil [name]   On: 05/31/2017 22:17   Ct Angio Chest Pe W Or Wo Contrast  Result Date: 06/01/2017 CLINICAL DATA:  Chest pain. Recent diagnosis of pneumonia, no improvement in symptoms after antibiotics. EXAM: CT ANGIOGRAPHY CHEST WITH CONTRAST TECHNIQUE: Multidetector CT imaging of the chest was performed using the standard  protocol during bolus administration of intravenous contrast. Multiplanar CT image reconstructions and MIPs were obtained to evaluate the vascular anatomy. CONTRAST:  100 cc Isovue 370 IV. No complication post contrast injection, patient was not pre-medicated. Listed history of Ionic contrast allergy reportedly from the 70's. COMPARISON:  Chest radiograph 4 hours prior FINDINGS: Cardiovascular: There are no filling defects within the pulmonary arteries to suggest pulmonary embolus. Thoracic aorta is normal in caliber with trace  atherosclerosis. There are coronary artery calcifications. Heart size at the upper limits of normal. No pericardial effusion. Mediastinum/Nodes: No mediastinal or hilar adenopathy. Visualized thyroid gland is normal. The esophagus is decompressed. Trace hiatal hernia. No axillary adenopathy. Lungs/Pleura: Clear lungs. No consolidation. No pulmonary edema. No pleural fluid. No pulmonary mass or suspicious nodule. Upper Abdomen: No acute abnormality. Musculoskeletal: There are no acute or suspicious osseous abnormalities. Review of the MIP images confirms the above findings. IMPRESSION: 1. No pulmonary embolus.  No acute intrathoracic abnormality. 2. Scattered coronary artery calcifications. Trace thoracic aortic atherosclerosis. Electronically Signed   By: Jeb Levering [name]   On: 06/01/2017 02:38    Procedures Procedures (including critical care time)  Medications Ordered in ED Medications  iopamidol (ISOVUE-370) 76 % injection (not administered)  ketorolac (TORADOL) 30 MG/ML injection 30 mg (30 mg Intravenous Given 06/01/17 0042)  iopamidol (ISOVUE-370) 76 % injection 100 mL (100 mLs Intravenous Contrast Given 06/01/17 0156)     Initial Impression / Assessment and Plan / ED Course  I have reviewed the triage vital signs and the nursing notes.  Pertinent labs & imaging results that were available during my care of the patient were reviewed by me and considered in my medical  decision making (see chart for details).    Patient presents with constant worsening pleuritic chest pain for the past 5 days. She reports being diagnosed with pneumonia at an urgent care on Monday and states her cough and congestion have improved after taking prednisone and a Z-Pak but reports worsening chest pain. VSS. Exam revealed tenderness over right anterior chest wall without evidence of injury. Remaining exam unremarkable. EKG showed sinus rhythm with no acute ischemic changes. Chest x-ray negative. Troponin negative. D-dimer 0.64. Labs unremarkable. I have a low suspicion for ACS, dissection, or other acute cardiac event at this time. Will order CT angio for further evaluation of CP due to concern for PE. Pt given toradol in the ED for pain. CT negative. On reevaluation patient reports improvement of symptoms. Suspect patient's symptoms are likely due to muscular skeletal chest wall pain. Plan to discharge patient home with NSAIDs and symptomatic treatment. Advised patient to follow up with PCP in 3-4 days for follow-up evaluation. Discussed strict return precautions.  Final Clinical Impressions(s) / ED Diagnoses   Final diagnoses:  Chest pain  Chest wall pain    New Prescriptions New Prescriptions   NAPROXEN (NAPROSYN) 500 MG TABLET    Take 1 tablet (500 mg total) by mouth 2 (two) times daily.     Nona Dell, PA-C 55/73/22 0254    Delora Fuel, MD 27/06/23 [phone]

## 2017-06-01 ENCOUNTER — Emergency Department (HOSPITAL_COMMUNITY): Payer: BLUE CROSS/BLUE SHIELD

## 2017-06-01 ENCOUNTER — Encounter (HOSPITAL_COMMUNITY): Payer: Self-pay | Admitting: Radiology

## 2017-06-01 DIAGNOSIS — R079 Chest pain, unspecified: Secondary | ICD-10-CM | POA: Diagnosis not present

## 2017-06-01 LAB — CBC WITH DIFFERENTIAL/PLATELET
Basophils Absolute: 0 10*3/uL (ref 0.0–0.1)
Basophils Relative: 0 %
Eosinophils Absolute: 0.1 10*3/uL (ref 0.0–0.7)
Eosinophils Relative: 2 %
HCT: 34.6 % — ABNORMAL LOW (ref 36.0–46.0)
Hemoglobin: 11.9 g/dL — ABNORMAL LOW (ref 12.0–15.0)
Lymphocytes Relative: 39 %
Lymphs Abs: 3 10*3/uL (ref 0.7–4.0)
MCH: 28.7 pg (ref 26.0–34.0)
MCHC: 34.4 g/dL (ref 30.0–36.0)
MCV: 83.4 fL (ref 78.0–100.0)
Monocytes Absolute: 0.7 10*3/uL (ref 0.1–1.0)
Monocytes Relative: 9 %
Neutro Abs: 3.7 10*3/uL (ref 1.7–7.7)
Neutrophils Relative %: 50 %
Platelets: 226 10*3/uL (ref 150–400)
RBC: 4.15 MIL/uL (ref 3.87–5.11)
RDW: 13.7 % (ref 11.5–15.5)
WBC: 7.5 10*3/uL (ref 4.0–10.5)

## 2017-06-01 LAB — D-DIMER, QUANTITATIVE: D-Dimer, Quant: 0.64 ug/mL-FEU — ABNORMAL HIGH (ref 0.00–0.50)

## 2017-06-01 LAB — BASIC METABOLIC PANEL
Anion gap: 8 (ref 5–15)
BUN: 13 mg/dL (ref 6–20)
CO2: 23 mmol/L (ref 22–32)
Calcium: 8.6 mg/dL — ABNORMAL LOW (ref 8.9–10.3)
Chloride: 108 mmol/L (ref 101–111)
Creatinine, Ser: 0.82 mg/dL (ref 0.44–1.00)
GFR calc Af Amer: >60 mL/min (ref >60)
GFR calc non Af Amer: >60 mL/min (ref >60)
Glucose, Bld: 111 mg/dL — ABNORMAL HIGH (ref 65–99)
Potassium: 3.4 mmol/L — ABNORMAL LOW (ref 3.5–5.1)
Sodium: 139 mmol/L (ref 135–145)

## 2017-06-01 MED ORDER — KETOROLAC TROMETHAMINE 30 MG/ML IJ SOLN
30.0000 mg | Freq: Once | INTRAMUSCULAR | Status: AC
  Administered 2017-06-01: 30 mg via INTRAVENOUS
  Filled 2017-06-01: qty 1

## 2017-06-01 MED ORDER — IOPAMIDOL (ISOVUE-370) INJECTION 76%
100.0000 mL | Freq: Once | INTRAVENOUS | Status: AC | PRN
  Administered 2017-06-01: 100 mL via INTRAVENOUS

## 2017-06-01 MED ORDER — NAPROXEN 500 MG PO TABS
500.0000 mg | ORAL_TABLET | Freq: Two times a day (BID) | ORAL | 0 refills | Status: DC
Start: 2017-06-01 — End: 2017-06-11

## 2017-06-01 MED ORDER — IOPAMIDOL (ISOVUE-370) INJECTION 76%
INTRAVENOUS | Status: AC
  Filled 2017-06-01: qty 100

## 2017-06-01 NOTE — Discharge Instructions (Signed)
Take your medication as prescribed as needed for pain relief. You may also continue taking your cough medication as prescribed as needed. I recommend fine up with your primary care provider within the next 3-4 days if your symptoms have not improved. Please return to the Emergency Department if symptoms worsen or new onset of fever, headache, dizziness, coughing up blood, difficulty breathing, new/worsening chest pain, heart palpitations, abdominal pain, vomiting, numbness, weakness, syncope.

## 2017-06-03 ENCOUNTER — Encounter: Payer: Self-pay | Admitting: Gastroenterology

## 2017-06-11 ENCOUNTER — Encounter: Payer: Self-pay | Admitting: Primary Care

## 2017-06-11 ENCOUNTER — Ambulatory Visit: Payer: BLUE CROSS/BLUE SHIELD | Admitting: Family Medicine

## 2017-06-11 ENCOUNTER — Ambulatory Visit (INDEPENDENT_AMBULATORY_CARE_PROVIDER_SITE_OTHER): Payer: BLUE CROSS/BLUE SHIELD | Admitting: Primary Care

## 2017-06-11 VITALS — BP 124/80 | HR 105 | Temp 98.9°F | Wt 229.1 lb

## 2017-06-11 DIAGNOSIS — J029 Acute pharyngitis, unspecified: Secondary | ICD-10-CM

## 2017-06-11 LAB — POCT RAPID STREP A (OFFICE): Rapid Strep A Screen: NEGATIVE

## 2017-06-11 NOTE — Addendum Note (Signed)
Addended by: Jacqualin Combes on: 06/11/2017 12:05 PM   Modules accepted: Orders

## 2017-06-11 NOTE — Patient Instructions (Signed)
You do not have strep throat.  Try warm salt gargles, chloraseptic spray, throat lozenges for sore throat.  Try Ibuprofen 600 mg three times daily as needed for sore throat and fevers.  It was a pleasure meeting you!

## 2017-06-11 NOTE — Progress Notes (Signed)
Subjective:    Patient ID: [name], female    DOB: 27-Apr-1966, 51 y.o.   MRN: [id number]  HPI  [name] is a 51 year old female who presets today with a chief complaint of fever. She also reports body aches, sore, nasal congestion, ear pain, cough. Her symptoms began two days ago with a fever. Her fevers are ranging 99-101.9. She's been taking tylenol, ibuprofen with improvement in fevers. She was diagnosed with pneumonia on 05/27/17 and treated with azithromycin course through [facility]. She's feeling better from her pneumonia diagnosis. She denies tick bites, sick contacts, nausea/vomiting.  Review of Systems  Constitutional: Positive for chills, fatigue and fever.  HENT: Positive for congestion, ear pain and sore throat.   Respiratory: Positive for cough. Negative for shortness of breath and wheezing.   Cardiovascular: Negative for chest pain.       Past Medical History:  Diagnosis Date  . Allergy   . Anxiety   . Depression   . GERD (gastroesophageal reflux disease)   . History of abnormal cervical Pap smear    1991 -- 1995--hx cryotherapy to cervix  . History of colon polyps    2008- BENIGN  . Hydronephrosis, left   . Kidney stones 2016  . PONV (postoperative nausea and vomiting)      Social History   Social History  . Marital status: Married    Spouse name: N/A  . Number of children: N/A  . Years of education: N/A   Occupational History  . Not on file.   Social History Main Topics  . Smoking status: Former Smoker    Years: 15.00    Types: Cigarettes    Quit date: 05/16/2008  . Smokeless tobacco: Never Used  . Alcohol use No  . Drug use: No  . Sexual activity: Yes    Partners: Male    Birth control/ protection: Surgical     Comment: Hyst   Other Topics Concern  . Not on file   Social History Narrative  . No narrative on file    Past Surgical History:  Procedure Laterality Date  . ABDOMINAL HYSTERECTOMY    . ANTERIOR AND POSTERIOR  REPAIR WITH SACROSPINOUS FIXATION N/A 08/16/2015   Procedure: ANTERIOR COLPORRHAPHY WITH XENOFORM GRAFT AND SACROSPINOUS FIXATION;  Surgeon: Nunzio Cobbs, MD;  Location: [facility];  Service: Gynecology;  Laterality: N/A;  2.5 hours OR time  . BLADDER SUSPENSION N/A 08/16/2015   Procedure: TRANSVAGINAL TAPE (TVT) PROCEDURE exact midurethral sling;  Surgeon: Nunzio Cobbs, MD;  Location: [facility];  Service: Gynecology;  Laterality: N/A;  . COLONOSCOPY    . COLONOSCOPY W/ POLYPECTOMY  05-30-2007  . CYSTOSCOPY N/A 08/16/2015   Procedure: CYSTOSCOPY;  Surgeon: Nunzio Cobbs, MD;  Location: [facility];  Service: Gynecology;  Laterality: N/A;  . CYSTOSCOPY W/ RETROGRADES Bilateral 06/22/2015   Procedure: CYSTOSCOPY WITH RETROGRADE PYELOGRAM;  Surgeon: Cleon Gustin, MD;  Location: [facility];  Service: Urology;  Laterality: Bilateral;  . CYSTOSCOPY W/ URETERAL STENT PLACEMENT  02/  2000  . CYSTOSCOPY WITH HOLMIUM LASER LITHOTRIPSY Left 05/18/2015   Procedure: CYSTOSCOPY WITH HOLMIUM LASER LITHOTRIPSY;  Surgeon: Alexis Frock, MD;  Location: [facility];  Service: Urology;  Laterality: Left;  . CYSTOSCOPY WITH RETROGRADE PYELOGRAM, URETEROSCOPY AND STENT PLACEMENT Left 06/22/2015   Procedure: CYSTOSCOPY,  LEFT URETEROSCOPY;  Surgeon: Cleon Gustin, MD;  Location: [facility];  Service:  Urology;  Laterality: Left;  . CYSTOSCOPY WITH URETEROSCOPY AND STENT PLACEMENT Left 05/18/2015   Procedure: CYSTOSCOPY WITH URETEROSCOPY, STONE MANIPULATION AND STENT PLACEMENT;  Surgeon: Alexis Frock, MD;  Location: [facility];  Service: Urology;  Laterality: Left;  . DIAGNOSTIC LAPAROSCOPY    . DX LAPAROSCOPY  X2  . ESOPHAGOGASTRODUODENOSCOPY  05-09-2007  . EXPLORATORY LAPAROTOMY W/ BILATERAL SALPINGECTOMY AND REMOVAL RIGHT ECTOPIC PREG.  02-23-2004  . EXTRACORPOREAL SHOCK WAVE LITHOTRIPSY  2001  &  2002  . LAPAROSCOPIC  CHOLECYSTECTOMY  1999  . POLYPECTOMY    . SHOULDER ARTHROSCOPY WITH OPEN ROTATOR CUFF REPAIR Right 2012  . TOTAL ABDOMINAL HYSTERECTOMY W/ BILATERAL OOPHORECTOMY AND LYSIS ADHESIONS  09-02-2007  . TUBAL LIGATION Bilateral 1995  . UMBILICAL HERNIA REPAIR  2003  . VAGINAL HYSTERECTOMY N/A 08/18/2015   Procedure: Exam under Anesthesia, Excision Xenform Graft, Removal Bilateral Sacrospinous Ligament sutures;  Surgeon: Nunzio Cobbs, MD;  Location: [facility];  Service: Gynecology;  Laterality: N/A;    Family History  Problem Relation Age of Onset  . Hypertension Mother   . Colon polyps Mother   . Diabetes Father   . Colon polyps Father   . Cancer Paternal Uncle        Pancreas  . Pancreatic cancer Paternal Uncle   . Heart attack Maternal Grandmother   . Drug abuse Sister   . Esophageal cancer Neg Hx   . Stomach cancer Neg Hx   . Rectal cancer Neg Hx   . Colon cancer Neg Hx     Allergies  Allergen Reactions  . Desvenlafaxine Other (See Comments)    Reaction:  Withdrawal   . Iodinated Diagnostic Agents Rash    Previously mis-entered as Iohexol allergy. Patient is not allergic to Non-Ionic contrast currently.    Current Outpatient Prescriptions on File Prior to Visit  Medication Sig Dispense Refill  . acetaminophen (TYLENOL) 500 MG tablet Take 1,000 mg by mouth every 6 (six) hours as needed for mild pain, moderate pain, fever or headache.    . ALPRAZolam (XANAX) 0.5 MG tablet Take 0.5 mg by mouth at bedtime as needed for anxiety or sleep.     Marland Kitchen buPROPion (WELLBUTRIN XL) 300 MG 24 hr tablet Take 1 tablet (300 mg total) by mouth daily. (Patient taking differently: Take 300 mg by mouth at bedtime. ) 90 tablet 0  . cholecalciferol (VITAMIN D) 1000 units tablet Take 1,000 Units by mouth daily.    . Multiple Vitamin (MULTIVITAMIN WITH MINERALS) TABS tablet Take 1 tablet by mouth daily.    Marland Kitchen omega-3 acid ethyl esters (LOVAZA) 1 g capsule Take 1 g by mouth daily.    .  promethazine-dextromethorphan (PROMETHAZINE-DM) 6.25-15 MG/5ML syrup Take 5 mLs by mouth every 6 (six) hours as needed for cough.     No current facility-administered medications on file prior to visit.     BP 124/80   Pulse (!) 105   Temp 98.9 F (37.2 C) (Oral)   Wt 229 lb 1.9 oz (103.9 kg)   LMP 12/24/2006 (Approximate)   SpO2 96%   BMI 33.84 kg/m    Objective:   Physical Exam  Constitutional: She appears well-nourished. She appears ill.  HENT:  Right Ear: Tympanic membrane and ear canal normal.  Left Ear: Ear canal normal. A middle ear effusion is present.  Nose: Mucosal edema present. Right sinus exhibits no maxillary sinus tenderness and no frontal sinus tenderness. Left sinus exhibits no maxillary sinus tenderness and no  frontal sinus tenderness.  Mouth/Throat: Posterior oropharyngeal edema and posterior oropharyngeal erythema present. No oropharyngeal exudate.  Eyes: Conjunctivae are normal.  Neck: Neck supple.  Cardiovascular: Normal rate and regular rhythm.   Pulmonary/Chest: Effort normal and breath sounds normal. She has no wheezes. She has no rales.  Lymphadenopathy:    She has no cervical adenopathy.  Skin: Skin is warm and dry.          Assessment & Plan:  Sore Throat:  Fevers, sore throat, body aches x 2 days. Exam today with erythema and edema to posterior pharynx. Rapid Strep: Negative. Symptoms resemble viral involvement given body aches, fevers, chills, etc. Will have her continue Ibuprofen 600 mg TID for fevers and body aches. Push intake of water. Delsym PRN cough, lungs clear today. Return precautions provided.  Sheral Flow, NP

## 2017-06-12 ENCOUNTER — Emergency Department (HOSPITAL_COMMUNITY)
Admission: EM | Admit: 2017-06-12 | Discharge: 2017-06-12 | Disposition: A | Discharge status: Home / Self Care | Payer: BLUE CROSS/BLUE SHIELD | Attending: Emergency Medicine | Admitting: Emergency Medicine

## 2017-06-12 ENCOUNTER — Encounter (HOSPITAL_COMMUNITY): Payer: Self-pay

## 2017-06-12 DIAGNOSIS — R110 Nausea: Secondary | ICD-10-CM | POA: Insufficient documentation

## 2017-06-12 DIAGNOSIS — R509 Fever, unspecified: Secondary | ICD-10-CM | POA: Diagnosis present

## 2017-06-12 DIAGNOSIS — M791 Myalgia: Secondary | ICD-10-CM | POA: Insufficient documentation

## 2017-06-12 DIAGNOSIS — Z87891 Personal history of nicotine dependence: Secondary | ICD-10-CM | POA: Insufficient documentation

## 2017-06-12 DIAGNOSIS — R197 Diarrhea, unspecified: Secondary | ICD-10-CM | POA: Diagnosis not present

## 2017-06-12 DIAGNOSIS — H9202 Otalgia, left ear: Secondary | ICD-10-CM | POA: Insufficient documentation

## 2017-06-12 DIAGNOSIS — R0981 Nasal congestion: Secondary | ICD-10-CM

## 2017-06-12 DIAGNOSIS — Z79899 Other long term (current) drug therapy: Secondary | ICD-10-CM | POA: Diagnosis not present

## 2017-06-12 DIAGNOSIS — J028 Acute pharyngitis due to other specified organisms: Secondary | ICD-10-CM

## 2017-06-12 DIAGNOSIS — J029 Acute pharyngitis, unspecified: Secondary | ICD-10-CM | POA: Diagnosis not present

## 2017-06-12 DIAGNOSIS — B9789 Other viral agents as the cause of diseases classified elsewhere: Secondary | ICD-10-CM

## 2017-06-12 LAB — RAPID STREP SCREEN (MED CTR MEBANE ONLY): Streptococcus, Group A Screen (Direct): NEGATIVE

## 2017-06-12 MED ORDER — HYDROCODONE-ACETAMINOPHEN 7.5-325 MG/15ML PO SOLN: 15.0000 mL | Freq: Four times a day (QID) | ORAL | 0 refills | Status: DC | PRN

## 2017-06-12 MED FILL — HYDROCOD-APAP 7.5-325/15ML: 7.5-325 | 2 days supply | Qty: 120 | Fill #0

## 2017-06-12 NOTE — ED Provider Notes (Signed)
[facility] DEPT Provider Note   CSN: [id number] Arrival date & time: 06/12/17  2094     History   Chief Complaint Chief Complaint  Patient presents with  . URI    HPI  [name] is a 51 y.o. female who presents with a fever and URI symptoms. PMH significant for anxiety/depression, recent CAP (first week of June). She was also seen in the ED on 6/8 for chest pain due to frequent coughing and had a CT of her chest which was negative. Three days ago she started to have a fever. She has been controlling her fever with Tylenol and ibuprofen. Yesterday she went to her primary care doctor diagnosed her with a URI. She had a rapid strep that was negative yesterday. Was given Delsym for cough. She states her symptoms have continued and reports associated chills, body aches, sore throat, nasal congestion, left ear pain, nausea. She denies runny nose, chest pain, shortness of breath, cough, abdominal pain, vomiting. No known sick contacts, rash, tick bites, recent travel.  HPI  Past Medical History:  Diagnosis Date  . Allergy   . Anxiety   . Depression   . GERD (gastroesophageal reflux disease)   . History of abnormal cervical Pap smear    1991 -- 1995--hx cryotherapy to cervix  . History of colon polyps    2008- BENIGN  . Hydronephrosis, left   . Kidney stones 2016  . PONV (postoperative nausea and vomiting)     Patient Active Problem List   Diagnosis Date Noted  . Ventral hernia without obstruction or gangrene 04/25/2017  . Adjustment disorder with anxiety 11/01/2014  . COLONIC POLYPS, HYPERPLASTIC 05/15/2008  . HEMORRHOIDS, INTERNAL 05/15/2008  . NEPHROLITHIASIS, HX OF 09/29/2007    Past Surgical History:  Procedure Laterality Date  . ABDOMINAL HYSTERECTOMY    . ANTERIOR AND POSTERIOR REPAIR WITH SACROSPINOUS FIXATION N/A 08/16/2015   Procedure: ANTERIOR COLPORRHAPHY WITH XENOFORM GRAFT AND SACROSPINOUS FIXATION;  Surgeon: Nunzio Cobbs, MD;  Location: [facility];  Service: Gynecology;  Laterality: N/A;  2.5 hours OR time  . BLADDER SUSPENSION N/A 08/16/2015   Procedure: TRANSVAGINAL TAPE (TVT) PROCEDURE exact midurethral sling;  Surgeon: Nunzio Cobbs, MD;  Location: [facility];  Service: Gynecology;  Laterality: N/A;  . COLONOSCOPY    . COLONOSCOPY W/ POLYPECTOMY  05-30-2007  . CYSTOSCOPY N/A 08/16/2015   Procedure: CYSTOSCOPY;  Surgeon: Nunzio Cobbs, MD;  Location: [facility];  Service: Gynecology;  Laterality: N/A;  . CYSTOSCOPY W/ RETROGRADES Bilateral 06/22/2015   Procedure: CYSTOSCOPY WITH RETROGRADE PYELOGRAM;  Surgeon: Cleon Gustin, MD;  [facility];  Service: Urology;  Laterality: Bilateral;  . CYSTOSCOPY W/ URETERAL STENT PLACEMENT  02/  2000  . CYSTOSCOPY WITH HOLMIUM LASER LITHOTRIPSY Left 05/18/2015   Procedure: CYSTOSCOPY WITH HOLMIUM LASER LITHOTRIPSY;  Surgeon: Alexis Frock, MD;  Location: [facility];  Service: Urology;  Laterality: Left;  . CYSTOSCOPY WITH RETROGRADE PYELOGRAM, URETEROSCOPY AND STENT PLACEMENT Left 06/22/2015   Procedure: CYSTOSCOPY,  LEFT URETEROSCOPY;  Surgeon: Cleon Gustin, MD;  Location: [facility];  Service: Urology;  Laterality: Left;  . CYSTOSCOPY WITH URETEROSCOPY AND STENT PLACEMENT Left 05/18/2015   Procedure: CYSTOSCOPY WITH URETEROSCOPY, STONE MANIPULATION AND STENT PLACEMENT;  Surgeon: Alexis Frock, MD;  Location: [facility];  Service: Urology;  Laterality: Left;  . DIAGNOSTIC LAPAROSCOPY    . DX LAPAROSCOPY  X2  . ESOPHAGOGASTRODUODENOSCOPY  05-09-2007  .  EXPLORATORY LAPAROTOMY W/ BILATERAL SALPINGECTOMY AND REMOVAL RIGHT ECTOPIC PREG.  02-23-2004  . EXTRACORPOREAL SHOCK WAVE LITHOTRIPSY  2001  &  2002  . LAPAROSCOPIC CHOLECYSTECTOMY  1999  . POLYPECTOMY    . SHOULDER ARTHROSCOPY WITH OPEN ROTATOR CUFF REPAIR Right 2012  . TOTAL ABDOMINAL HYSTERECTOMY W/ BILATERAL OOPHORECTOMY AND LYSIS ADHESIONS   09-02-2007  . TUBAL LIGATION Bilateral 1995  . UMBILICAL HERNIA REPAIR  2003  . VAGINAL HYSTERECTOMY N/A 08/18/2015   Procedure: Exam under Anesthesia, Excision Xenform Graft, Removal Bilateral Sacrospinous Ligament sutures;  Surgeon: Nunzio Cobbs, MD;  Location: [facility];  Service: Gynecology;  Laterality: N/A;    OB History    Gravida Para Term Preterm AB Living   3 2 2   1 2    SAB TAB Ectopic Multiple Live Births       1           Home Medications    Prior to Admission medications   Medication Sig Start Date End Date Taking? Authorizing Provider  acetaminophen (TYLENOL) 500 MG tablet Take 1,000 mg by mouth every 6 (six) hours as needed for mild pain, moderate pain, fever or headache.    [provider]  ALPRAZolam Duanne Moron) 0.5 MG tablet Take 0.5 mg by mouth at bedtime as needed for anxiety or sleep.     [provider]  buPROPion (WELLBUTRIN XL) 300 MG 24 hr tablet Take 1 tablet (300 mg total) by mouth daily. Patient taking differently: Take 300 mg by mouth at bedtime.  02/20/17   Jearld Fenton, NP  cholecalciferol (VITAMIN D) 1000 units tablet Take 1,000 Units by mouth daily.    [provider]  Multiple Vitamin (MULTIVITAMIN WITH MINERALS) TABS tablet Take 1 tablet by mouth daily.    [provider]  omega-3 acid ethyl esters (LOVAZA) 1 g capsule Take 1 g by mouth daily.    [provider]  promethazine-dextromethorphan (PROMETHAZINE-DM) 6.25-15 MG/5ML syrup Take 5 mLs by mouth every 6 (six) hours as needed for cough.    [provider]    Family History Family History  Problem Relation Age of Onset  . Hypertension Mother   . Colon polyps Mother   . Diabetes Father   . Colon polyps Father   . Cancer Paternal Uncle        Pancreas  . Pancreatic cancer Paternal Uncle   . Heart attack Maternal Grandmother   . Drug abuse Sister   . Esophageal cancer Neg Hx   . Stomach cancer Neg Hx   . Rectal cancer Neg Hx     . Colon cancer Neg Hx     Social History Social History  Substance Use Topics  . Smoking status: Former Smoker    Years: 15.00    Types: Cigarettes    Quit date: 05/16/2008  . Smokeless tobacco: Never Used  . Alcohol use No     Allergies   Desvenlafaxine and Iodinated diagnostic agents   Review of Systems Review of Systems  Constitutional: Positive for chills and fever.  HENT: Positive for congestion, ear pain and sore throat. Negative for rhinorrhea.   Respiratory: Negative for cough and shortness of breath.   Cardiovascular: Negative for chest pain.  Gastrointestinal: Positive for diarrhea and nausea. Negative for abdominal pain and vomiting.  Genitourinary: Negative for dysuria.  Musculoskeletal: Positive for myalgias and neck pain.  Skin: Negative for rash.     Physical Exam Updated Vital Signs BP 140/65 (BP Location:  Right Arm)   Pulse 100   Temp 98.3 F (36.8 C) (Oral)   Resp 16   LMP 12/24/2006 (Approximate)   SpO2 95%   Physical Exam  Constitutional: She is oriented to person, place, and time. She appears well-developed and well-nourished. No distress.  HENT:  Head: Normocephalic and atraumatic.  Right Ear: Hearing, tympanic membrane, external ear and ear canal normal.  Left Ear: Hearing, external ear and ear canal normal. A middle ear effusion is present.  Nose: Mucosal edema present. No rhinorrhea. Right sinus exhibits no maxillary sinus tenderness and no frontal sinus tenderness. Left sinus exhibits no maxillary sinus tenderness and no frontal sinus tenderness.  Mouth/Throat: Uvula is midline, oropharynx is clear and moist and mucous membranes are normal. Tonsils are 1+ on the right. Tonsils are 1+ on the left. Tonsillar exudate (Right sided exudate).  Eyes: Conjunctivae are normal. Pupils are equal, round, and reactive to light. Right eye exhibits no discharge. Left eye exhibits no discharge. No scleral icterus.  Neck: Normal range of motion.   Cardiovascular: Normal rate.   Pulmonary/Chest: Effort normal. No respiratory distress.  Abdominal: She exhibits no distension.  Lymphadenopathy:    She has cervical adenopathy (anterior).  Neurological: She is alert and oriented to person, place, and time.  Skin: Skin is warm and dry.  Psychiatric: She has a normal mood and affect. Her behavior is normal.  Nursing note and vitals reviewed.    ED Treatments / Results  Labs (all labs ordered are listed, but only abnormal results are displayed) Labs Reviewed  RAPID STREP SCREEN (NOT AT [facility])  CULTURE, GROUP A STREP [facility])    EKG  EKG Interpretation None       Radiology No results found.  Procedures Procedures (including critical care time)  Medications Ordered in ED Medications - No data to display   Initial Impression / Assessment and Plan / ED Course  I have reviewed the triage vital signs and the nursing notes.  Pertinent labs & imaging results that were available during my care of the patient were reviewed by me and considered in my medical decision making (see chart for details).  51 year old female with pharyngitis. Vitals are normal. Repeat rapid strep is negative. Still likely viral. Will treat with flonase, antihistamines, hycet, NSAIDs and advised f/u in a week if symptoms aren't improving   Final Clinical Impressions(s) / ED Diagnoses   Final diagnoses:  Sore throat (viral)  Nasal congestion    New Prescriptions New Prescriptions   No medications on file     Iris Pert 06/12/17 [place]    Carmin Muskrat, MD 06/12/17 [phone]

## 2017-06-12 NOTE — Discharge Instructions (Signed)
Use Flonase and take an antihistamine for congestion (Zyrtec, Clairitin, Allegra) Drink plenty of fluids Continue Ibuprofen Take pain medicine as needed

## 2017-06-12 NOTE — ED Triage Notes (Signed)
She c/o sore throat and congestion since Sun. She saw her pcp, who performed strep screen which was "negative". Here today because symptoms persist.

## 2017-06-14 ENCOUNTER — Encounter: Payer: BLUE CROSS/BLUE SHIELD | Admitting: Gastroenterology

## 2017-06-14 LAB — CULTURE, GROUP A STREP (THRC)

## 2017-06-24 ENCOUNTER — Inpatient Hospital Stay: Admission: RE | Admit: 2017-06-24 | Payer: BLUE CROSS/BLUE SHIELD | Source: Ambulatory Visit

## 2017-07-16 ENCOUNTER — Other Ambulatory Visit: Payer: Self-pay | Admitting: Internal Medicine

## 2017-07-16 ENCOUNTER — Encounter: Payer: Self-pay | Admitting: Gastroenterology

## 2017-07-16 ENCOUNTER — Encounter: Payer: Self-pay | Admitting: Internal Medicine

## 2017-07-16 MED ORDER — ALPRAZOLAM 0.5 MG PO TABS: 0.5000 mg | ORAL_TABLET | Freq: Every evening | ORAL | 0 refills | Status: DC | PRN

## 2017-07-16 MED ORDER — BUPROPION HCL ER (XL) 300 MG PO TB24: 300.0000 mg | ORAL_TABLET | Freq: Every day | ORAL | 0 refills | Status: DC

## 2017-08-19 ENCOUNTER — Encounter: Payer: Self-pay | Admitting: Gastroenterology

## 2017-08-21 ENCOUNTER — Ambulatory Visit (AMBULATORY_SURGERY_CENTER): Payer: BLUE CROSS/BLUE SHIELD | Admitting: Gastroenterology

## 2017-08-21 ENCOUNTER — Encounter: Payer: Self-pay | Admitting: Gastroenterology

## 2017-08-21 VITALS — BP 115/68 | HR 81 | Temp 98.4°F | Resp 18 | Ht 69.0 in | Wt 236.0 lb

## 2017-08-21 DIAGNOSIS — Z1212 Encounter for screening for malignant neoplasm of rectum: Secondary | ICD-10-CM | POA: Diagnosis not present

## 2017-08-21 DIAGNOSIS — Z1211 Encounter for screening for malignant neoplasm of colon: Secondary | ICD-10-CM

## 2017-08-21 HISTORY — PX: COLONOSCOPY: SHX174

## 2017-08-21 MED ORDER — SODIUM CHLORIDE 0.9 % IV SOLN: 500.0000 mL | INTRAVENOUS | Status: DC

## 2017-08-21 NOTE — Progress Notes (Signed)
Pt's states no medical or surgical changes since previsit or office visit. maw 

## 2017-08-21 NOTE — Progress Notes (Signed)
Report to PACU, RN, vss, BBS= Clear.  

## 2017-08-21 NOTE — Patient Instructions (Signed)
Discharge instructions given. Normal exam. Resume previous medications. YOU HAD AN ENDOSCOPIC PROCEDURE TODAY AT THE [facility]:   Refer to the procedure report that was given to you for any specific questions about what was found during the examination.  If the procedure report does not answer your questions, please call your gastroenterologist to clarify.  If you requested that your care partner not be given the details of your procedure findings, then the procedure report has been included in a sealed envelope for you to review at your convenience later.  YOU SHOULD EXPECT: Some feelings of bloating in the abdomen. Passage of more gas than usual.  Walking can help get rid of the air that was put into your GI tract during the procedure and reduce the bloating. If you had a lower endoscopy (such as a colonoscopy or flexible sigmoidoscopy) you may notice spotting of blood in your stool or on the toilet paper. If you underwent a bowel prep for your procedure, you may not have a normal bowel movement for a few days.  Please Note:  You might notice some irritation and congestion in your nose or some drainage.  This is from the oxygen used during your procedure.  There is no need for concern and it should clear up in a day or so.  SYMPTOMS TO REPORT IMMEDIATELY:   Following lower endoscopy (colonoscopy or flexible sigmoidoscopy):  Excessive amounts of blood in the stool  Significant tenderness or worsening of abdominal pains  Swelling of the abdomen that is new, acute  Fever of 100F or higher   For urgent or emergent issues, a gastroenterologist can be reached at any hour by calling [phone].   DIET:  We do recommend a small meal at first, but then you may proceed to your regular diet.  Drink plenty of fluids but you should avoid alcoholic beverages for 24 hours.  ACTIVITY:  You should plan to take it easy for the rest of today and you should NOT DRIVE or use heavy machinery  until tomorrow (because of the sedation medicines used during the test).    FOLLOW UP: Our staff will call the number listed on your records the next business day following your procedure to check on you and address any questions or concerns that you may have regarding the information given to you following your procedure. If we do not reach you, we will leave a message.  However, if you are feeling well and you are not experiencing any problems, there is no need to return our call.  We will assume that you have returned to your regular daily activities without incident.  If any biopsies were taken you will be contacted by phone or by letter within the next 1-3 weeks.  Please call us at [phone] if you have not heard about the biopsies in 3 weeks.    SIGNATURES/CONFIDENTIALITY: You and/or your care partner have signed paperwork which will be entered into your electronic medical record.  These signatures attest to the fact that that the information above on your After Visit Summary has been reviewed and is understood.  Full responsibility of the confidentiality of this discharge information lies with you and/or your care-partner. 

## 2017-08-21 NOTE — Op Note (Signed)
[facility] Patient Name: [name] Procedure Date: 08/21/2017 3:02 PM MRN: [id number] Endoscopist: Milus Banister , MD Age: 51 Referring MD:  Date of Birth: 06-Aug-1966 Gender: Female Account #: [id number] Procedure:                Colonoscopy Indications:              Screening for colorectal malignant neoplasm Medicines:                Monitored Anesthesia Care Procedure:                Pre-Anesthesia Assessment:                           - Prior to the procedure, a History and Physical                            was performed, and patient medications and                            allergies were reviewed. The patient's tolerance of                            previous anesthesia was also reviewed. The risks                            and benefits of the procedure and the sedation                            options and risks were discussed with the patient.                            All questions were answered, and informed consent                            was obtained. Prior Anticoagulants: The patient has                            taken no previous anticoagulant or antiplatelet                            agents. ASA Grade Assessment: II - A patient with                            mild systemic disease. After reviewing the risks                            and benefits, the patient was deemed in                            satisfactory condition to undergo the procedure.                           After obtaining informed consent, the colonoscope  was passed under direct vision. Throughout the                            procedure, the patient's blood pressure, pulse, and                            oxygen saturations were monitored continuously. The                            Colonoscope was introduced through the anus and                            advanced to the the cecum, identified by                            appendiceal orifice and  ileocecal valve. The                            colonoscopy was performed without difficulty. The                            patient tolerated the procedure well. The quality                            of the bowel preparation was good. The ileocecal                            valve, appendiceal orifice, and rectum were                            photographed. Scope In: 3:14:17 PM Scope Out: 3:25:20 PM Scope Withdrawal Time: 0 hours 7 minutes 5 seconds  Total Procedure Duration: 0 hours 11 minutes 3 seconds  Findings:                 The entire examined colon appeared normal on direct                            and retroflexion views. Complications:            No immediate complications. Estimated blood loss:                            None. Estimated Blood Loss:     Estimated blood loss: none. Impression:               - The entire examined colon is normal on direct and                            retroflexion views.                           - No polyps or cancers. Recommendation:           - Patient has a contact number available for  emergencies. The signs and symptoms of potential                            delayed complications were discussed with the                            patient. Return to normal activities tomorrow.                            Written discharge instructions were provided to the                            patient.                           - Resume previous diet.                           - Continue present medications.                           - Repeat colonoscopy in 10 years for screening                            purposes. Milus Banister, MD 08/21/2017 3:28:22 PM This report has been signed electronically.

## 2017-08-22 ENCOUNTER — Telehealth: Payer: Self-pay | Admitting: *Deleted

## 2017-08-22 NOTE — Telephone Encounter (Signed)
  Follow up Call-  Call back number 08/21/2017  Post procedure Call Back phone  # #[phone] cell  Permission to leave phone message Yes  Some recent data might be hidden     Patient questions:  Do you have a fever, pain , or abdominal swelling? No. Pain Score  0 *  Have you tolerated food without any problems? Yes.    Have you been able to return to your normal activities? Yes.    Do you have any questions about your discharge instructions: Diet   No. Medications  No. Follow up visit  No.  Do you have questions or concerns about your Care? No.  Actions: * If pain score is 4 or above: No action needed, pain <4.

## 2017-09-26 DIAGNOSIS — Z23 Encounter for immunization: Secondary | ICD-10-CM | POA: Diagnosis not present

## 2017-09-27 ENCOUNTER — Encounter: Payer: Self-pay | Admitting: Internal Medicine

## 2017-11-15 ENCOUNTER — Emergency Department
Admission: EM | Admit: 2017-11-15 | Discharge: 2017-11-15 | Disposition: A | Discharge status: Home / Self Care | Payer: BLUE CROSS/BLUE SHIELD | Attending: Emergency Medicine | Admitting: Emergency Medicine

## 2017-11-15 ENCOUNTER — Encounter: Payer: Self-pay | Admitting: Emergency Medicine

## 2017-11-15 ENCOUNTER — Emergency Department: Payer: BLUE CROSS/BLUE SHIELD

## 2017-11-15 DIAGNOSIS — Z87891 Personal history of nicotine dependence: Secondary | ICD-10-CM | POA: Insufficient documentation

## 2017-11-15 DIAGNOSIS — Z79899 Other long term (current) drug therapy: Secondary | ICD-10-CM | POA: Diagnosis not present

## 2017-11-15 DIAGNOSIS — K5901 Slow transit constipation: Secondary | ICD-10-CM | POA: Diagnosis not present

## 2017-11-15 DIAGNOSIS — K5730 Diverticulosis of large intestine without perforation or abscess without bleeding: Secondary | ICD-10-CM | POA: Diagnosis not present

## 2017-11-15 DIAGNOSIS — R1031 Right lower quadrant pain: Secondary | ICD-10-CM | POA: Diagnosis not present

## 2017-11-15 LAB — COMPREHENSIVE METABOLIC PANEL
ALT: 17 U/L (ref 14–54)
AST: 20 U/L (ref 15–41)
Albumin: 4.1 g/dL (ref 3.5–5.0)
Alkaline Phosphatase: 116 U/L (ref 38–126)
Anion gap: 10 (ref 5–15)
BUN: 15 mg/dL (ref 6–20)
CO2: 25 mmol/L (ref 22–32)
Calcium: 9.2 mg/dL (ref 8.9–10.3)
Chloride: 103 mmol/L (ref 101–111)
Creatinine, Ser: 0.92 mg/dL (ref 0.44–1.00)
GFR calc Af Amer: >60 mL/min (ref >60)
GFR calc non Af Amer: >60 mL/min (ref >60)
Glucose, Bld: 96 mg/dL (ref 65–99)
Potassium: 3.6 mmol/L (ref 3.5–5.1)
Sodium: 138 mmol/L (ref 135–145)
Total Bilirubin: 0.1 mg/dL — ABNORMAL LOW (ref 0.3–1.2)
Total Protein: 7.4 g/dL (ref 6.5–8.1)

## 2017-11-15 LAB — URINALYSIS, COMPLETE (UACMP) WITH MICROSCOPIC
Bacteria, UA: NONE SEEN
Bilirubin Urine: NEGATIVE
Glucose, UA: NEGATIVE mg/dL
Ketones, ur: NEGATIVE mg/dL
Leukocytes, UA: NEGATIVE
Nitrite: NEGATIVE
Protein, ur: NEGATIVE mg/dL
Specific Gravity, Urine: 1.003 — ABNORMAL LOW (ref 1.005–1.030)
pH: 7 (ref 5.0–8.0)

## 2017-11-15 LAB — CBC
HCT: 39.7 % (ref 35.0–47.0)
Hemoglobin: 13.7 g/dL (ref 12.0–16.0)
MCH: 28.9 pg (ref 26.0–34.0)
MCHC: 34.4 g/dL (ref 32.0–36.0)
MCV: 84 fL (ref 80.0–100.0)
Platelets: 216 10*3/uL (ref 150–440)
RBC: 4.73 MIL/uL (ref 3.80–5.20)
RDW: 13.9 % (ref 11.5–14.5)
WBC: 7.6 10*3/uL (ref 3.6–11.0)

## 2017-11-15 MED ORDER — ONDANSETRON HCL 4 MG/2ML IJ SOLN
INTRAMUSCULAR | Status: AC
  Filled 2017-11-15: qty 2

## 2017-11-15 MED ORDER — ONDANSETRON HCL 4 MG/2ML IJ SOLN: 4.0000 mg | Freq: Once | INTRAMUSCULAR | Status: DC

## 2017-11-15 MED ORDER — MORPHINE SULFATE (PF) 4 MG/ML IV SOLN
4.0000 mg | Freq: Once | INTRAVENOUS | Status: AC
  Administered 2017-11-15: 4 mg via INTRAVENOUS

## 2017-11-15 MED ORDER — KETOROLAC TROMETHAMINE 30 MG/ML IJ SOLN
30.0000 mg | Freq: Once | INTRAMUSCULAR | Status: AC
  Administered 2017-11-15: 30 mg via INTRAVENOUS
  Filled 2017-11-15: qty 1

## 2017-11-15 MED ORDER — OXYCODONE-ACETAMINOPHEN 5-325 MG PO TABS
ORAL_TABLET | ORAL | Status: AC
  Filled 2017-11-15: qty 1

## 2017-11-15 MED ORDER — TRAMADOL HCL 50 MG PO TABS: 50.0000 mg | ORAL_TABLET | Freq: Four times a day (QID) | ORAL | 0 refills | Status: DC | PRN

## 2017-11-15 MED ORDER — MORPHINE SULFATE (PF) 4 MG/ML IV SOLN
INTRAVENOUS | Status: AC
  Administered 2017-11-15: 4 mg via INTRAVENOUS
  Filled 2017-11-15: qty 1

## 2017-11-15 MED ORDER — OXYCODONE-ACETAMINOPHEN 5-325 MG PO TABS
1.0000 | ORAL_TABLET | Freq: Once | ORAL | Status: AC
  Administered 2017-11-15: 1 via ORAL

## 2017-11-15 NOTE — ED Triage Notes (Signed)
Pt states she has had this pain for the last 2 days, and has a hx of [place], her last one 2 years ago.  She started taking Azo thinking she had a UTI.  She has been taking ibuprofen with no relief.  She states her urine has been smelling foul the last couple of days but has not noticed any blood, although she reports some cloudiness in it.  Pt denies any fevers.

## 2017-11-15 NOTE — ED Notes (Signed)
Patient transported to CT 

## 2017-11-15 NOTE — ED Provider Notes (Addendum)
[facility] Department Provider Note   ____________________________________________    I have reviewed the triage vital signs and the nursing notes.   HISTORY  Chief Complaint Flank Pain     HPI [name] is a 51 y.o. female who presents with complaints of right flank pain.  She reports the pain is waxing and waning but is constant.  She has had this pain over 2 days.  She has not taken anything for this pain.  The pain does not radiate.  Nothing makes it worse.  No fevers or chills.  No dysuria.  Decreased  appetite patient does report a history of kidney stones in the past but is unsure if this feels similar.  She has a history of lithotripsy   Past Medical History:  Diagnosis Date  . Allergy   . Anxiety   . Depression   . GERD (gastroesophageal reflux disease)   . History of abnormal cervical Pap smear    1991 -- 1995--hx cryotherapy to cervix  . History of colon polyps    2008- BENIGN  . Hydronephrosis, left   . Kidney stones 2016  . PONV (postoperative nausea and vomiting)     Patient Active Problem List   Diagnosis Date Noted  . Ventral hernia without obstruction or gangrene 04/25/2017  . Adjustment disorder with anxiety 11/01/2014  . COLONIC POLYPS, HYPERPLASTIC 05/15/2008  . HEMORRHOIDS, INTERNAL 05/15/2008  . NEPHROLITHIASIS, HX OF 09/29/2007    Past Surgical History:  Procedure Laterality Date  . ABDOMINAL HYSTERECTOMY    . ANTERIOR AND POSTERIOR REPAIR WITH SACROSPINOUS FIXATION N/A 08/16/2015   Procedure: ANTERIOR COLPORRHAPHY WITH XENOFORM GRAFT AND SACROSPINOUS FIXATION;  Surgeon: Nunzio Cobbs, MD;  Location: [facility];  Service: Gynecology;  Laterality: N/A;  2.5 hours OR time  . BLADDER SUSPENSION N/A 08/16/2015   Procedure: TRANSVAGINAL TAPE (TVT) PROCEDURE exact midurethral sling;  Surgeon: Nunzio Cobbs, MD;  Location: [facility];  Service: Gynecology;  Laterality: N/A;  . COLONOSCOPY    .  COLONOSCOPY W/ POLYPECTOMY  05-30-2007  . CYSTOSCOPY N/A 08/16/2015   Procedure: CYSTOSCOPY;  Surgeon: Nunzio Cobbs, MD;  Location: [facility];  Service: Gynecology;  Laterality: N/A;  . CYSTOSCOPY W/ RETROGRADES Bilateral 06/22/2015   Procedure: CYSTOSCOPY WITH RETROGRADE PYELOGRAM;  Surgeon: Cleon Gustin, MD;  Location: [facility];  Service: Urology;  Laterality: Bilateral;  . CYSTOSCOPY W/ URETERAL STENT PLACEMENT  02/  2000  . CYSTOSCOPY WITH HOLMIUM LASER LITHOTRIPSY Left 05/18/2015   Procedure: CYSTOSCOPY WITH HOLMIUM LASER LITHOTRIPSY;  Surgeon: Alexis Frock, MD;  Location: [facility];  Service: Urology;  Laterality: Left;  . CYSTOSCOPY WITH RETROGRADE PYELOGRAM, URETEROSCOPY AND STENT PLACEMENT Left 06/22/2015   Procedure: CYSTOSCOPY,  LEFT URETEROSCOPY;  Surgeon: Cleon Gustin, MD;  Location: [facility];  Service: Urology;  Laterality: Left;  . CYSTOSCOPY WITH URETEROSCOPY AND STENT PLACEMENT Left 05/18/2015   Procedure: CYSTOSCOPY WITH URETEROSCOPY, STONE MANIPULATION AND STENT PLACEMENT;  Surgeon: Alexis Frock, MD;  Location: [facility];  Service: Urology;  Laterality: Left;  . DIAGNOSTIC LAPAROSCOPY    . DX LAPAROSCOPY  X2  . ESOPHAGOGASTRODUODENOSCOPY  05-09-2007  . EXPLORATORY LAPAROTOMY W/ BILATERAL SALPINGECTOMY AND REMOVAL RIGHT ECTOPIC PREG.  02-23-2004  . EXTRACORPOREAL SHOCK WAVE LITHOTRIPSY  2001  &  2002  . LAPAROSCOPIC CHOLECYSTECTOMY  1999  . POLYPECTOMY    . SHOULDER ARTHROSCOPY WITH OPEN ROTATOR CUFF  REPAIR Right 2012  . TOTAL ABDOMINAL HYSTERECTOMY W/ BILATERAL OOPHORECTOMY AND LYSIS ADHESIONS  09-02-2007  . TUBAL LIGATION Bilateral 1995  . UMBILICAL HERNIA REPAIR  2003  . VAGINAL HYSTERECTOMY N/A 08/18/2015   Procedure: Exam under Anesthesia, Excision Xenform Graft, Removal Bilateral Sacrospinous Ligament sutures;  Surgeon: Nunzio Cobbs, MD;  Location: [facility];  Service:  Gynecology;  Laterality: N/A;    Prior to Admission medications   Medication Sig Start Date End Date Taking? Authorizing Provider  acetaminophen (TYLENOL) 500 MG tablet Take 1,000 mg by mouth every 6 (six) hours as needed for mild pain, moderate pain, fever or headache.    [provider]  ALPRAZolam Duanne Moron) 0.5 MG tablet Take 1 tablet (0.5 mg total) by mouth at bedtime as needed for anxiety or sleep. 07/16/17   Jearld Fenton, NP  buPROPion (WELLBUTRIN XL) 300 MG 24 hr tablet Take 1 tablet (300 mg total) by mouth daily. 07/16/17   Jearld Fenton, NP  cholecalciferol (VITAMIN D) 1000 units tablet Take 1,000 Units by mouth daily.    [provider]  HYDROcodone-acetaminophen (HYCET) 7.5-325 mg/15 ml solution Take 15 mLs by mouth 4 (four) times daily as needed for moderate pain. 06/12/17 06/12/18  Recardo Evangelist, PA-C  Multiple Vitamin (MULTIVITAMIN WITH MINERALS) TABS tablet Take 1 tablet by mouth daily.    [provider]  omega-3 acid ethyl esters (LOVAZA) 1 g capsule Take 1 g by mouth daily.    [provider]  promethazine-dextromethorphan (PROMETHAZINE-DM) 6.25-15 MG/5ML syrup Take 5 mLs by mouth every 6 (six) hours as needed for cough.    [provider]  traMADol (ULTRAM) 50 MG tablet Take 1 tablet (50 mg total) by mouth every 6 (six) hours as needed. 11/15/17   Lavonia Drafts, MD     Allergies Desvenlafaxine and Iodinated diagnostic agents  Family History  Problem Relation Age of Onset  . Hypertension Mother   . Colon polyps Mother   . Diabetes Father   . Colon polyps Father   . Cancer Paternal Uncle        Pancreas  . Pancreatic cancer Paternal Uncle   . Heart attack Maternal Grandmother   . Drug abuse Sister   . Esophageal cancer Neg Hx   . Stomach cancer Neg Hx   . Rectal cancer Neg Hx   . Colon cancer Neg Hx     Social History Social History   Tobacco Use  . Smoking status: Former Smoker    Years: 15.00    Types:  Cigarettes    Last attempt to quit: 05/16/2008    Years since quitting: 9.5  . Smokeless tobacco: Never Used  Substance Use Topics  . Alcohol use: No    Alcohol/week: 0.0 oz  . Drug use: No    Review of Systems  Constitutional: No fever/chills Eyes: No visual changes.  ENT: No sore throat. Cardiovascular: Denies chest pain. Respiratory: Denies shortness of breath. Gastrointestinal: As above Genitourinary: Negative for dysuria. Musculoskeletal: Negative for back pain. Skin: Negative for rash. Neurological: Negative for headaches or weakness   ____________________________________________   PHYSICAL EXAM:  VITAL SIGNS: ED Triage Vitals  Enc Vitals Group     BP 11/15/17 2055 (!) 167/79     Pulse Rate 11/15/17 2055 79     Resp 11/15/17 2055 16     Temp 11/15/17 2055 99.2 F (37.3 C)     Temp Source 11/15/17 2055 Oral     SpO2 11/15/17  2055 100 %     Weight 11/15/17 2056 97.5 kg (215 lb)     Height 11/15/17 2056 1.676 m (5\' 6" )     Head Circumference --      Peak Flow --      Pain Score --      Pain Loc --      Pain Edu? --      Excl. in [place]? --     Constitutional: Alert and oriented. No acute distress. Pleasant and interactive Eyes: Conjunctivae are normal.   Nose: No congestion/rhinnorhea. Mouth/Throat: Mucous membranes are moist.    Cardiovascular: Normal rate, regular rhythm. Kermit Balo peripheral circulation. Respiratory: Normal respiratory effort.  No retractions.  Gastrointestinal: Soft and nontender. No distention.  No CVA tenderness. Genitourinary: deferred Musculoskeletal: No lower extremity tenderness nor edema.  Warm and well perfused Neurologic:  Normal speech and language. No gross focal neurologic deficits are appreciated.  Skin:  Skin is warm, dry and intact. No rash noted. Psychiatric: Mood and affect are normal. Speech and behavior are normal.  ____________________________________________   LABS (all labs ordered are listed, but only abnormal  results are displayed)  Labs Reviewed  URINALYSIS, COMPLETE (UACMP) WITH MICROSCOPIC - Abnormal; Notable for the following components:      Result Value   Color, Urine STRAW (*)    APPearance CLEAR (*)    Specific Gravity, Urine 1.003 (*)    Hgb urine dipstick SMALL (*)    Squamous Epithelial / LPF 0-5 (*)    All other components within normal limits  COMPREHENSIVE METABOLIC PANEL - Abnormal; Notable for the following components:   Total Bilirubin 0.1 (*)    All other components within normal limits  CBC   ____________________________________________  EKG  None ____________________________________________  RADIOLOGY  CT renal stone study shows significant constipation, no kidney stones, ventral hernia not where patient is having pain ____________________________________________   PROCEDURES  Procedure(s) performed: No  Procedures   Critical Care performed: No ____________________________________________   INITIAL IMPRESSION / ASSESSMENT AND PLAN / ED COURSE  Pertinent labs & imaging results that were available during my care of the patient were reviewed by me and considered in my medical decision making (see chart for details).  Patient presents with right flank pain.  Differential diagnosis includes UTI, ureterolithiasis, appendicitis  We will check labs, CT renal stone study, give IV Toradol and reevaluate  Patient had minimal improvement in pain after Toradol, given IV morphine and IV Zofran with significant improvement.  CT scan shows primarily constipation, she does have a ventral hernia but this is not where her pain is and she has no tenderness at the location.  No evidence of stone. Lab work is reassuring.  Recommend magnesium citrate/MiraLAX, will Rx pain medication as well.  Return precautions discussed at length.  Patient and husband agree with plan for discharge  Patient asked for PO pain medication since she cannot go to the pharmacy at this time.        ____________________________________________   FINAL CLINICAL IMPRESSION(S) / ED DIAGNOSES  Final diagnoses:  Right lower quadrant abdominal pain  Slow transit constipation        Note:  This document was prepared using Dragon voice recognition software and may include unintentional dictation errors.    Lavonia Drafts, MD 11/15/17 [id number]    Lavonia Drafts, MD 11/15/17 [phone]

## 2017-11-15 NOTE — ED Notes (Signed)

## 2017-11-21 ENCOUNTER — Telehealth: Payer: Self-pay | Admitting: Gastroenterology

## 2017-11-21 NOTE — Telephone Encounter (Signed)
The pt was seen in the ED on Friday and was advised to try mag citrate and miralax for colonic burden.  She states she did try the mag citrate and did have loose stools, she continues to have abd pain on the right side.  She would like you to review her CT scan.

## 2017-11-22 NOTE — Telephone Encounter (Signed)
I reviewed her CT, ER notes.  The CT scan was a renal stone protocol and so was without IV contrast.  Her CBC and CMET were normal.  Not sure what is causing her symptoms, offer her next available appt with myself or extender.  thanks

## 2017-11-22 NOTE — Telephone Encounter (Signed)
The pt has been advised and ROV scheduled for 01/10/18.  She will call back if she develops worse pain

## 2017-11-25 ENCOUNTER — Ambulatory Visit: Payer: Self-pay | Admitting: Internal Medicine

## 2017-11-25 NOTE — Progress Notes (Deleted)
Subjective:    Patient ID: [name], female    DOB: 1966-09-09, 51 y.o.   MRN: [id number]  HPI  Pt presents to the clinic today for ER follow up. She went to the ER 11/23 with c/o right flank pain. Labs were unremarkable. CT scan showed constipation. She was treated with IV Toradol (no response), subsequently given IV Morphine and Zofran with good relief. She was given oral laxatives and advised to follow up with her PCP. Since discharge.  Review of Systems      Past Medical History:  Diagnosis Date  . Allergy   . Anxiety   . Depression   . GERD (gastroesophageal reflux disease)   . History of abnormal cervical Pap smear    1991 -- 1995--hx cryotherapy to cervix  . History of colon polyps    2008- BENIGN  . Hydronephrosis, left   . Kidney stones 2016  . PONV (postoperative nausea and vomiting)     Current Outpatient Medications  Medication Sig Dispense Refill  . acetaminophen (TYLENOL) 500 MG tablet Take 1,000 mg by mouth every 6 (six) hours as needed for mild pain, moderate pain, fever or headache.    . ALPRAZolam (XANAX) 0.5 MG tablet Take 1 tablet (0.5 mg total) by mouth at bedtime as needed for anxiety or sleep. 20 tablet 0  . buPROPion (WELLBUTRIN XL) 300 MG 24 hr tablet Take 1 tablet (300 mg total) by mouth daily. 90 tablet 0  . cholecalciferol (VITAMIN D) 1000 units tablet Take 1,000 Units by mouth daily.    Marland Kitchen HYDROcodone-acetaminophen (HYCET) 7.5-325 mg/15 ml solution Take 15 mLs by mouth 4 (four) times daily as needed for moderate pain. 120 mL 0  . Multiple Vitamin (MULTIVITAMIN WITH MINERALS) TABS tablet Take 1 tablet by mouth daily.    Marland Kitchen omega-3 acid ethyl esters (LOVAZA) 1 g capsule Take 1 g by mouth daily.    . promethazine-dextromethorphan (PROMETHAZINE-DM) 6.25-15 MG/5ML syrup Take 5 mLs by mouth every 6 (six) hours as needed for cough.    . traMADol (ULTRAM) 50 MG tablet Take 1 tablet (50 mg total) by mouth every 6 (six) hours as needed. 20 tablet 0    Current Facility-Administered Medications  Medication Dose Route Frequency Provider Last Rate Last Dose  . 0.9 %  sodium chloride infusion  500 mL Intravenous Continuous Milus Banister, MD        Allergies  Allergen Reactions  . Desvenlafaxine Other (See Comments)    Reaction:  Withdrawal   . Iodinated Diagnostic Agents Rash    Previously mis-entered as Iohexol allergy. Patient is not allergic to Non-Ionic contrast currently.    Family History  Problem Relation Age of Onset  . Hypertension Mother   . Colon polyps Mother   . Diabetes Father   . Colon polyps Father   . Cancer Paternal Uncle        Pancreas  . Pancreatic cancer Paternal Uncle   . Heart attack Maternal Grandmother   . Drug abuse Sister   . Esophageal cancer Neg Hx   . Stomach cancer Neg Hx   . Rectal cancer Neg Hx   . Colon cancer Neg Hx     Social History   Socioeconomic History  . Marital status: Married    Spouse name: Not on file  . Number of children: Not on file  . Years of education: Not on file  . Highest education level: Not on file  Social Needs  . Financial resource strain:  Not on file  . Food insecurity - worry: Not on file  . Food insecurity - inability: Not on file  . Transportation needs - medical: Not on file  . Transportation needs - non-medical: Not on file  Occupational History  . Not on file  Tobacco Use  . Smoking status: Former Smoker    Years: 15.00    Types: Cigarettes    Last attempt to quit: 05/16/2008    Years since quitting: 9.5  . Smokeless tobacco: Never Used  Substance and Sexual Activity  . Alcohol use: No    Alcohol/week: 0.0 oz  . Drug use: No  . Sexual activity: Yes    Partners: Male    Birth control/protection: Surgical    Comment: Hyst  Other Topics Concern  . Not on file  Social History Narrative  . Not on file     Constitutional: Denies fever, malaise, fatigue, headache or abrupt weight changes.  HEENT: Denies eye pain, eye redness, ear pain,  ringing in the ears, wax buildup, runny nose, nasal congestion, bloody nose, or sore throat. Respiratory: Denies difficulty breathing, shortness of breath, cough or sputum production.   Cardiovascular: Denies chest pain, chest tightness, palpitations or swelling in the hands or feet.  Gastrointestinal: Denies abdominal pain, bloating, constipation, diarrhea or blood in the stool.  GU: Denies urgency, frequency, pain with urination, burning sensation, blood in urine, odor or discharge. Musculoskeletal: Denies decrease in range of motion, difficulty with gait, muscle pain or joint pain and swelling.  Skin: Denies redness, rashes, lesions or ulcercations.  Neurological: Denies dizziness, difficulty with memory, difficulty with speech or problems with balance and coordination.  Psych: Denies anxiety, depression, SI/HI.  No other specific complaints in a complete review of systems (except as listed in HPI above).  Objective:   Physical Exam        Assessment & Plan:

## 2018-01-01 ENCOUNTER — Other Ambulatory Visit: Payer: Self-pay | Admitting: Internal Medicine

## 2018-01-01 MED ORDER — BUPROPION HCL ER (XL) 300 MG PO TB24: 300.0000 mg | ORAL_TABLET | Freq: Every day | ORAL | 0 refills | Status: DC

## 2018-01-01 MED ORDER — ALPRAZOLAM 0.5 MG PO TABS: 0.5000 mg | ORAL_TABLET | Freq: Every evening | ORAL | 0 refills | Status: DC | PRN

## 2018-01-01 NOTE — Telephone Encounter (Signed)
Will send Wellbutrin and Xanax per request

## 2018-01-01 NOTE — Telephone Encounter (Signed)
Xanax last filled 07/16/17... Pt has CPE in Feb... Please advise

## 2018-01-10 ENCOUNTER — Encounter: Payer: Self-pay | Admitting: Gastroenterology

## 2018-01-10 ENCOUNTER — Ambulatory Visit: Payer: BLUE CROSS/BLUE SHIELD | Admitting: Gastroenterology

## 2018-01-10 VITALS — BP 122/80 | HR 72 | Ht 66.0 in | Wt 208.1 lb

## 2018-01-10 DIAGNOSIS — R109 Unspecified abdominal pain: Secondary | ICD-10-CM | POA: Diagnosis not present

## 2018-01-10 NOTE — Progress Notes (Signed)
Review of pertinent gastrointestinal problems: 1.  Routine risk for colon cancer.  Screening colonoscopy August 2018, Dr. Ardis Hughs was completely normal.  Recommended repeat colon cancer screening with a colonoscopy at 10-year interval 2. Chronic intermittent abdominal pain.  Began sprin 2008.  Describes the pain as spasms.  Multiple imaging studies and lab tests all essentially normal.  CT scan May 2008 done without IV contrast was normal, except for a small nephrolithiasis not felt to be causing her discomfort.  Repeat CT scan May 21, 2007 small amount of free fluid thought to be possibly physiologic in a premenopausal woman. No obstructive uropathy.  Small 3.3-mm stone in the lower pole of left kidney.  This was done with and without IV contrast.  Otherwise, normal scan.  Abdominal ultrasound May 2008 normal post cholecystectomy findings.  Normal CBC.  Normal complete metabolic profile.  Normal TSH Single elevation of ALT on 1 occasion, although repeat was normal.  EGD May 2008 was normal except for more than usual amount of small bowel spasm. 3. Epig pain hospitalization 04/2017: for sudden onset of epigastric pain, vomiting and elevated LFTs. CT and MRI with MRCP Imaging revealed a ventral hernia, steatosis , hepatomegaly, pancreatic divisum but no evidence for pancreatitis. The ventral hernia was evaluated by surgery, given outpatient follow up. Viral hepatitis studies negative. ANA, IgG normal. Negative  H. Pylori IgG.Symptoms improved and LFTs normalized by time of discharge except for mildly elevated ALT.     HPI: This is a very pleasant 52 year old woman whom I last saw the time of a colonoscopy this past summer.  Chief complaint is chronic intermittent right sided flank pain  She has intermittent right-sided flank pains for many months, probably years.  This is different than her epigastric/right upper quadrant pain for which she was hospitalized last summer.  The current pain is a stabbing  pain  .  She had she presented to the emergency room for it 2 months ago and a CT scan suggested high stool burden.  She does not have clinical constipation however.  She really never pushes or strains to move her bowels.  She goes about every day or every other day.  Eating does not seem to alter the right sided abdominal pain.  Bowel movements do not change it either.  Body positions do not seem to bring it on or make it better or worse.  ROS: complete GI ROS as described in HPI, all other review negative.  Constitutional:  No unintentional weight loss   Past Medical History:  Diagnosis Date  . Allergy   . Anxiety   . Depression   . GERD (gastroesophageal reflux disease)   . History of abnormal cervical Pap smear    1991 -- 1995--hx cryotherapy to cervix  . History of colon polyps    2008- BENIGN  . Hydronephrosis, left   . Kidney stones 2016  . PONV (postoperative nausea and vomiting)     Past Surgical History:  Procedure Laterality Date  . ABDOMINAL HYSTERECTOMY    . ANTERIOR AND POSTERIOR REPAIR WITH SACROSPINOUS FIXATION N/A 08/16/2015   Procedure: ANTERIOR COLPORRHAPHY WITH XENOFORM GRAFT AND SACROSPINOUS FIXATION;  Surgeon: Nunzio Cobbs, MD;  Location: [facility];  Service: Gynecology;  Laterality: N/A;  2.5 hours OR time  . BLADDER SUSPENSION N/A 08/16/2015   Procedure: TRANSVAGINAL TAPE (TVT) PROCEDURE exact midurethral sling;  Surgeon: Nunzio Cobbs, MD;  Location: [facility];  Service: Gynecology;  Laterality: N/A;  . COLONOSCOPY    .  COLONOSCOPY W/ POLYPECTOMY  05-30-2007  . CYSTOSCOPY N/A 08/16/2015   Procedure: CYSTOSCOPY;  Surgeon: Nunzio Cobbs, MD;  Location: [facility];  Service: Gynecology;  Laterality: N/A;  . CYSTOSCOPY W/ RETROGRADES Bilateral 06/22/2015   Procedure: CYSTOSCOPY WITH RETROGRADE PYELOGRAM;  Surgeon: Cleon Gustin, MD;  [facility];  Service: Urology;  Laterality: Bilateral;  . CYSTOSCOPY W/  URETERAL STENT PLACEMENT  02/  2000  . CYSTOSCOPY WITH HOLMIUM LASER LITHOTRIPSY Left 05/18/2015   Procedure: CYSTOSCOPY WITH HOLMIUM LASER LITHOTRIPSY;  Surgeon: Alexis Frock, MD;  Location: [facility];  Service: Urology;  Laterality: Left;  . CYSTOSCOPY WITH RETROGRADE PYELOGRAM, URETEROSCOPY AND STENT PLACEMENT Left 06/22/2015   Procedure: CYSTOSCOPY,  LEFT URETEROSCOPY;  Surgeon: Cleon Gustin, MD;  Location: [facility];  Service: Urology;  Laterality: Left;  . CYSTOSCOPY WITH URETEROSCOPY AND STENT PLACEMENT Left 05/18/2015   Procedure: CYSTOSCOPY WITH URETEROSCOPY, STONE MANIPULATION AND STENT PLACEMENT;  Surgeon: Alexis Frock, MD;  Location: [facility];  Service: Urology;  Laterality: Left;  . DIAGNOSTIC LAPAROSCOPY    . DX LAPAROSCOPY  X2  . ESOPHAGOGASTRODUODENOSCOPY  05-09-2007  . EXPLORATORY LAPAROTOMY W/ BILATERAL SALPINGECTOMY AND REMOVAL RIGHT ECTOPIC PREG.  02-23-2004  . EXTRACORPOREAL SHOCK WAVE LITHOTRIPSY  2001  &  2002  . LAPAROSCOPIC CHOLECYSTECTOMY  1999  . POLYPECTOMY    . SHOULDER ARTHROSCOPY WITH OPEN ROTATOR CUFF REPAIR Right 2012  . TOTAL ABDOMINAL HYSTERECTOMY W/ BILATERAL OOPHORECTOMY AND LYSIS ADHESIONS  09-02-2007  . TUBAL LIGATION Bilateral 1995  . UMBILICAL HERNIA REPAIR  2003  . VAGINAL HYSTERECTOMY N/A 08/18/2015   Procedure: Exam under Anesthesia, Excision Xenform Graft, Removal Bilateral Sacrospinous Ligament sutures;  Surgeon: Nunzio Cobbs, MD;  Location: [facility];  Service: Gynecology;  Laterality: N/A;    Current Outpatient Medications  Medication Sig Dispense Refill  . ALPRAZolam (XANAX) 0.5 MG tablet Take 1 tablet (0.5 mg total) by mouth at bedtime as needed for anxiety or sleep. 20 tablet 0  . buPROPion (WELLBUTRIN XL) 300 MG 24 hr tablet Take 1 tablet (300 mg total) by mouth daily. 90 tablet 0  . Multiple Vitamin (MULTIVITAMIN WITH MINERALS) TABS tablet Take 1 tablet by mouth daily.     Marland Kitchen acetaminophen (TYLENOL) 500 MG tablet Take 1,000 mg by mouth every 6 (six) hours as needed for mild pain, moderate pain, fever or headache.    . cholecalciferol (VITAMIN D) 1000 units tablet Take 1,000 Units by mouth daily.    Marland Kitchen HYDROcodone-acetaminophen (HYCET) 7.5-325 mg/15 ml solution Take 15 mLs by mouth 4 (four) times daily as needed for moderate pain. (Patient not taking: Reported on 01/10/2018) 120 mL 0  . omega-3 acid ethyl esters (LOVAZA) 1 g capsule Take 1 g by mouth daily.    . promethazine-dextromethorphan (PROMETHAZINE-DM) 6.25-15 MG/5ML syrup Take 5 mLs by mouth every 6 (six) hours as needed for cough.    . traMADol (ULTRAM) 50 MG tablet Take 1 tablet (50 mg total) by mouth every 6 (six) hours as needed. (Patient not taking: Reported on 01/10/2018) 20 tablet 0   Current Facility-Administered Medications  Medication Dose Route Frequency Provider Last Rate Last Dose  . 0.9 %  sodium chloride infusion  500 mL Intravenous Continuous Milus Banister, MD        Allergies as of 01/10/2018 - Review Complete 01/10/2018  Allergen Reaction Noted  . Desvenlafaxine Other (See Comments) 05/03/2009  . Iodinated diagnostic agents Rash  06/01/2017    Family History  Problem Relation Age of Onset  . Hypertension Mother   . Colon polyps Mother   . Diabetes Father   . Colon polyps Father   . Cancer Paternal Uncle        Pancreas  . Pancreatic cancer Paternal Uncle   . Heart attack Maternal Grandmother   . Drug abuse Sister   . Esophageal cancer Neg Hx   . Stomach cancer Neg Hx   . Rectal cancer Neg Hx   . Colon cancer Neg Hx     Social History   Socioeconomic History  . Marital status: Married    Spouse name: Not on file  . Number of children: Not on file  . Years of education: Not on file  . Highest education level: Not on file  Social Needs  . Financial resource strain: Not on file  . Food insecurity - worry: Not on file  . Food insecurity - inability: Not on file  .  Transportation needs - medical: Not on file  . Transportation needs - non-medical: Not on file  Occupational History  . Not on file  Tobacco Use  . Smoking status: Former Smoker    Years: 15.00    Types: Cigarettes    Last attempt to quit: 05/16/2008    Years since quitting: 9.6  . Smokeless tobacco: Never Used  Substance and Sexual Activity  . Alcohol use: No    Alcohol/week: 0.0 oz  . Drug use: No  . Sexual activity: Yes    Partners: Male    Birth control/protection: Surgical    Comment: Hyst  Other Topics Concern  . Not on file  Social History Narrative  . Not on file     Physical Exam: BP 122/80   Pulse 72   Ht 5\' 6"  (1.676 m)   Wt 208 lb 2 oz (94.4 kg)   LMP 12/24/2006 (Approximate)   BMI 33.59 kg/m  Constitutional: generally well-appearing Psychiatric: alert and oriented x3 Abdomen: soft, nontender, nondistended, no obvious ascites, no peritoneal signs, normal bowel sounds No peripheral edema noted in lower extremities  Assessment and plan: 52 y.o. female with right-sided flank pain  I do not think that her GI tract is responsible for this right flank pain.  I doubt it is anything serious given recent CT scans, MRI, blood work and even colonoscopy less than a year ago.  I do not think it is related to constipation.  I explained to her that she certainly could have adhesive disease given her previous abdominal surgeries and that can present with unusual pains such as hers.  I recommended no further testing which she is fine with.  She does know to call here if the pain significantly changes in character or severity.  Please see the "Patient Instructions" section for addition details about the plan.  Owens Loffler, MD [facility] 01/10/2018, 10:40 AM

## 2018-01-10 NOTE — Patient Instructions (Addendum)
Return as needed.  Normal BMI (Body Mass Index- based on height and weight) is between 19 and 25. Your BMI today is Body mass index is 33.59 kg/m. Marland Kitchen Please consider follow up  regarding your BMI with your Primary Care Provider.

## 2018-01-31 ENCOUNTER — Encounter: Payer: Self-pay | Admitting: Internal Medicine

## 2018-02-07 ENCOUNTER — Encounter: Payer: Self-pay | Admitting: Internal Medicine

## 2018-03-25 ENCOUNTER — Ambulatory Visit
Admission: RE | Admit: 2018-03-25 | Discharge: 2018-03-25 | Disposition: A | Discharge status: Home / Self Care | Payer: BLUE CROSS/BLUE SHIELD | Source: Ambulatory Visit | Attending: Obstetrics and Gynecology | Admitting: Obstetrics and Gynecology

## 2018-03-25 DIAGNOSIS — Z1231 Encounter for screening mammogram for malignant neoplasm of breast: Secondary | ICD-10-CM | POA: Diagnosis not present

## 2018-04-01 ENCOUNTER — Ambulatory Visit (INDEPENDENT_AMBULATORY_CARE_PROVIDER_SITE_OTHER): Payer: BLUE CROSS/BLUE SHIELD | Admitting: Internal Medicine

## 2018-04-01 ENCOUNTER — Encounter: Payer: Self-pay | Admitting: Internal Medicine

## 2018-04-01 VITALS — BP 120/80 | HR 77 | Temp 98.2°F | Ht 65.5 in | Wt 200.0 lb

## 2018-04-01 DIAGNOSIS — F32A Depression, unspecified: Secondary | ICD-10-CM

## 2018-04-01 DIAGNOSIS — F329 Major depressive disorder, single episode, unspecified: Secondary | ICD-10-CM | POA: Diagnosis not present

## 2018-04-01 DIAGNOSIS — E781 Pure hyperglyceridemia: Secondary | ICD-10-CM | POA: Diagnosis not present

## 2018-04-01 DIAGNOSIS — Z0000 Encounter for general adult medical examination without abnormal findings: Secondary | ICD-10-CM | POA: Diagnosis not present

## 2018-04-01 DIAGNOSIS — K219 Gastro-esophageal reflux disease without esophagitis: Secondary | ICD-10-CM | POA: Insufficient documentation

## 2018-04-01 DIAGNOSIS — F419 Anxiety disorder, unspecified: Secondary | ICD-10-CM | POA: Diagnosis not present

## 2018-04-01 DIAGNOSIS — Z79899 Other long term (current) drug therapy: Secondary | ICD-10-CM | POA: Diagnosis not present

## 2018-04-01 DIAGNOSIS — E785 Hyperlipidemia, unspecified: Secondary | ICD-10-CM | POA: Insufficient documentation

## 2018-04-01 LAB — COMPREHENSIVE METABOLIC PANEL
ALT: 14 U/L (ref 0–35)
AST: 15 U/L (ref 0–37)
Albumin: 3.8 g/dL (ref 3.5–5.2)
Alkaline Phosphatase: 78 U/L (ref 39–117)
BUN: 15 mg/dL (ref 6–23)
CO2: 30 mEq/L (ref 19–32)
Calcium: 9.2 mg/dL (ref 8.4–10.5)
Chloride: 104 mEq/L (ref 96–112)
Creatinine, Ser: 0.82 mg/dL (ref 0.40–1.20)
GFR: 77.69 mL/min (ref >60.00)
Glucose, Bld: 98 mg/dL (ref 70–99)
Potassium: 4.6 mEq/L (ref 3.5–5.1)
Sodium: 139 mEq/L (ref 135–145)
Total Bilirubin: 0.3 mg/dL (ref 0.2–1.2)
Total Protein: 7.2 g/dL (ref 6.0–8.3)

## 2018-04-01 LAB — CBC
HCT: 38.5 % (ref 36.0–46.0)
Hemoglobin: 13.2 g/dL (ref 12.0–15.0)
MCHC: 34.2 g/dL (ref 30.0–36.0)
MCV: 85.2 fl (ref 78.0–100.0)
Platelets: 237 10*3/uL (ref 150.0–400.0)
RBC: 4.52 Mil/uL (ref 3.87–5.11)
RDW: 13.5 % (ref 11.5–15.5)
WBC: 5.6 10*3/uL (ref 4.0–10.5)

## 2018-04-01 LAB — LIPID PANEL
Cholesterol: 145 mg/dL (ref 0–200)
HDL: 35.9 mg/dL — ABNORMAL LOW (ref >39.00)
LDL Cholesterol: 83 mg/dL (ref 0–99)
NonHDL: 109.22
Total CHOL/HDL Ratio: 4
Triglycerides: 130 mg/dL (ref 0.0–149.0)
VLDL: 26 mg/dL (ref 0.0–40.0)

## 2018-04-01 LAB — VITAMIN D 25 HYDROXY (VIT D DEFICIENCY, FRACTURES): VITD: 31.6 ng/mL (ref 30.00–100.00)

## 2018-04-01 MED ORDER — FLUOXETINE HCL 10 MG PO TABS: 10.0000 mg | ORAL_TABLET | Freq: Every day | ORAL | 2 refills | Status: DC

## 2018-04-01 NOTE — Assessment & Plan Note (Signed)
Deteriorated Continue Wellbutrin as prescribed Continue Xanax prn- CSA and UDS today eRx for Fluoxetine 10 mg daily- side effects discussed Support offered today  Let me know how you are doing in 1 month via mychart

## 2018-04-01 NOTE — Assessment & Plan Note (Signed)
CMET and Lipid profile today Will let you know if you need to restart Lovaza Encouraged her to consume a low fat diet

## 2018-04-01 NOTE — Addendum Note (Signed)
Addended by: Lurlean Nanny on: 04/01/2018 09:38 AM   Modules accepted: Orders

## 2018-04-01 NOTE — Progress Notes (Signed)
Subjective:    Patient ID: [name], female    DOB: May 02, 1966, 52 y.o.   MRN: [id number]  HPI  Pt presents to the clinic today for her annual exam. She is also due to follow up chronic conditions.  Anxiety and Depression: She reports recent increase in stress which she feels like has made her anxiety worse. She has started getting very anxious being in a crowd, waking up in a panic attack. She is taking the Wellbutrin as prescribed. She takes Xanax on a rare basis for extreme anxiety. She denies SI/HI.  HLD: Her last LDL was 108, triglycerides 107, 02/2017. She is not taking Lovaza as prescribed. She tries to consume a low fat diet.   GERD: Currently not an issue. She is not taking anything OTC for this at this time.  Flu: 09/2017 Tetanus: 12/2013 Pap Smear: 12/2016- pelvic exam Mammogram: 03/2018 Colon Screening: 07/2017 Vision Screening: every 3 years Dentist: annually  Diet: She does eat meat. She consumes fruits and veggies daily. She tries to avoid fried foods. She drinks mostly water. Exercise: She has started walking for 15-30 minutes 2-3 days per week.  Review of Systems      Past Medical History:  Diagnosis Date  . Allergy   . Anxiety   . Depression   . GERD (gastroesophageal reflux disease)   . History of abnormal cervical Pap smear    1991 -- 1995--hx cryotherapy to cervix  . History of colon polyps    2008- BENIGN  . Hydronephrosis, left   . Kidney stones 2016  . PONV (postoperative nausea and vomiting)     Current Outpatient Medications  Medication Sig Dispense Refill  . acetaminophen (TYLENOL) 500 MG tablet Take 1,000 mg by mouth every 6 (six) hours as needed for mild pain, moderate pain, fever or headache.    . ALPRAZolam (XANAX) 0.5 MG tablet Take 1 tablet (0.5 mg total) by mouth at bedtime as needed for anxiety or sleep. 20 tablet 0  . buPROPion (WELLBUTRIN XL) 300 MG 24 hr tablet Take 1 tablet (300 mg total) by mouth daily. 90 tablet 0  . Multiple  Vitamin (MULTIVITAMIN WITH MINERALS) TABS tablet Take 1 tablet by mouth daily.    . cholecalciferol (VITAMIN D) 1000 units tablet Take 1,000 Units by mouth daily.    Marland Kitchen omega-3 acid ethyl esters (LOVAZA) 1 g capsule Take 1 g by mouth daily.     No current facility-administered medications for this visit.     Allergies  Allergen Reactions  . Desvenlafaxine Other (See Comments)    Reaction:  Withdrawal   . Iodinated Diagnostic Agents Rash    Previously mis-entered as Iohexol allergy. Patient is not allergic to Non-Ionic contrast currently.    Family History  Problem Relation Age of Onset  . Hypertension Mother   . Colon polyps Mother   . Diabetes Father   . Colon polyps Father   . Cancer Paternal Uncle        Pancreas  . Pancreatic cancer Paternal Uncle   . Heart attack Maternal Grandmother   . Drug abuse Sister   . Esophageal cancer Neg Hx   . Stomach cancer Neg Hx   . Rectal cancer Neg Hx   . Colon cancer Neg Hx     Social History   Socioeconomic History  . Marital status: Married    Spouse name: Not on file  . Number of children: Not on file  . Years of education: Not on file  .  Highest education level: Not on file  Occupational History  . Not on file  Social Needs  . Financial resource strain: Not on file  . Food insecurity:    Worry: Not on file    Inability: Not on file  . Transportation needs:    Medical: Not on file    Non-medical: Not on file  Tobacco Use  . Smoking status: Former Smoker    Years: 15.00    Types: Cigarettes    Last attempt to quit: 05/16/2008    Years since quitting: 9.8  . Smokeless tobacco: Never Used  Substance and Sexual Activity  . Alcohol use: No    Alcohol/week: 0.0 oz  . Drug use: No  . Sexual activity: Yes    Partners: Male    Birth control/protection: Surgical    Comment: Hyst  Lifestyle  . Physical activity:    Days per week: Not on file    Minutes per session: Not on file  . Stress: Not on file  Relationships  .  Social connections:    Talks on phone: Not on file    Gets together: Not on file    Attends religious service: Not on file    Active member of club or organization: Not on file    Attends meetings of clubs or organizations: Not on file    Relationship status: Not on file  . Intimate partner violence:    Fear of current or ex partner: Not on file    Emotionally abused: Not on file    Physically abused: Not on file    Forced sexual activity: Not on file  Other Topics Concern  . Not on file  Social History Narrative  . Not on file     Constitutional: Pt reports weight loss (intended). Denies fever, malaise, fatigue, headache.  HEENT: Denies eye pain, eye redness, ear pain, ringing in the ears, wax buildup, runny nose, nasal congestion, bloody nose, or sore throat. Respiratory: Denies difficulty breathing, shortness of breath, cough or sputum production.   Cardiovascular: Denies chest pain, chest tightness, palpitations or swelling in the hands or feet.  Gastrointestinal: Denies abdominal pain, bloating, constipation, diarrhea or blood in the stool.  GU: Denies urgency, frequency, pain with urination, burning sensation, blood in urine, odor or discharge. Musculoskeletal: Denies decrease in range of motion, difficulty with gait, muscle pain or joint pain and swelling.  Skin: Denies redness, rashes, lesions or ulcercations.  Neurological: Denies dizziness, difficulty with memory, difficulty with speech or problems with balance and coordination.  Psych: Pt reports anxiety and has a history of depression. Denies  SI/HI.  No other specific complaints in a complete review of systems (except as listed in HPI above).  Objective:   Physical Exam   BP 120/80   Pulse 77   Temp 98.2 F (36.8 C) (Oral)   Ht 5' 5.5" (1.664 m)   Wt 200 lb (90.7 kg)   LMP 12/24/2006 (Approximate)   SpO2 98%   BMI 32.78 kg/m  Wt Readings from Last 3 Encounters:  04/01/18 200 lb (90.7 kg)  01/10/18 208 lb 2  oz (94.4 kg)  11/15/17 215 lb (97.5 kg)    General: Appears her stated age, obese in NAD. Skin: Warm, dry and intact.  HEENT: Head: normal shape and size; Eyes: sclera white, no icterus, conjunctiva pink, PERRLA and EOMs intact; Ears: Tm's gray and intact, normal light reflex;  Throat/Mouth: Teeth present, mucosa pink and moist, no exudate, lesions or ulcerations noted.  Neck:  Neck supple, trachea midline. No masses, lumps or thyromegaly present.  Cardiovascular: Normal rate and rhythm. S1,S2 noted.  No murmur, rubs or gallops noted. No JVD or BLE edema. No carotid bruits noted. Pulmonary/Chest: Normal effort and positive vesicular breath sounds. No respiratory distress. No wheezes, rales or ronchi noted.  Abdomen: Soft and nontender. Normal bowel sounds. No distention or masses noted. Liver, spleen and kidneys non palpable. Musculoskeletal: Strength 5/5 BUE/BLE. No difficulty with gait.  Neurological: Alert and oriented. Cranial nerves II-XII grossly intact. Coordination normal.  Psychiatric: Mood and affect normal. Behavior is normal. Judgment and thought content normal.     BMET    Component Value Date/Time   NA 138 11/15/2017 2142   K 3.6 11/15/2017 2142   CL 103 11/15/2017 2142   CO2 25 11/15/2017 2142   GLUCOSE 96 11/15/2017 2142   BUN 15 11/15/2017 2142   CREATININE 0.92 11/15/2017 2142   CALCIUM 9.2 11/15/2017 2142   GFRNONAA >60 11/15/2017 2142   GFRAA >60 11/15/2017 2142    Lipid Panel     Component Value Date/Time   CHOL 175 03/18/2017 0954   TRIG 107.0 03/18/2017 0954   HDL 45.60 03/18/2017 0954   CHOLHDL 4 03/18/2017 0954   VLDL 21.4 03/18/2017 0954   LDLCALC 108 (H) 03/18/2017 0954    CBC    Component Value Date/Time   WBC 7.6 11/15/2017 2142   RBC 4.73 11/15/2017 2142   HGB 13.7 11/15/2017 2142   HCT 39.7 11/15/2017 2142   PLT 216 11/15/2017 2142   MCV 84.0 11/15/2017 2142   MCH 28.9 11/15/2017 2142   MCHC 34.4 11/15/2017 2142   RDW 13.9  11/15/2017 2142   LYMPHSABS 3.0 05/31/2017 2332   MONOABS 0.7 05/31/2017 2332   EOSABS 0.1 05/31/2017 2332   BASOSABS 0.0 05/31/2017 2332    Hgb A1C Lab Results  Component Value Date   HGBA1C 5.8 03/18/2017           Assessment & Plan:   Preventative Health Maintenance:  Encouraged her to get a flu shot in the fall Tetanus UTD Pelvic exam and mammogram UTD Colon screening UTD Encouraged her to consume a balanced diet and exercise regimen Advised her to see an eye doctor and dentist annually Will check CBC, CMET, Lipid and Vit D today  RTC in 1 year, sooner if needed Webb Silversmith, NP

## 2018-04-01 NOTE — Patient Instructions (Signed)
Health Maintenance for Postmenopausal Women Menopause is a normal process in which your reproductive ability comes to an end. This process happens gradually over a span of months to years, usually between the ages of 12 and 73. Menopause is complete when you have missed 12 consecutive menstrual periods. It is important to talk with your health care provider about some of the most common conditions that affect postmenopausal women, such as heart disease, cancer, and bone loss (osteoporosis). Adopting a healthy lifestyle and getting preventive care can help to promote your health and wellness. Those actions can also lower your chances of developing some of these common conditions. What should I know about menopause? During menopause, you may experience a number of symptoms, such as:  Moderate-to-severe hot flashes.  Night sweats.  Decrease in sex drive.  Mood swings.  Headaches.  Tiredness.  Irritability.  Memory problems.  Insomnia.  Choosing to treat or not to treat menopausal changes is an individual decision that you make with your health care provider. What should I know about hormone replacement therapy and supplements? Hormone therapy products are effective for treating symptoms that are associated with menopause, such as hot flashes and night sweats. Hormone replacement carries certain risks, especially as you become older. If you are thinking about using estrogen or estrogen with progestin treatments, discuss the benefits and risks with your health care provider. What should I know about heart disease and stroke? Heart disease, heart attack, and stroke become more likely as you age. This may be due, in part, to the hormonal changes that your body experiences during menopause. These can affect how your body processes dietary fats, triglycerides, and cholesterol. Heart attack and stroke are both medical emergencies. There are many things that you can do to help prevent heart disease  and stroke:  Have your blood pressure checked at least every 1-2 years. High blood pressure causes heart disease and increases the risk of stroke.  If you are 68-54 years old, ask your health care provider if you should take aspirin to prevent a heart attack or a stroke.  Do not use any tobacco products, including cigarettes, chewing tobacco, or electronic cigarettes. If you need help quitting, ask your health care provider.  It is important to eat a healthy diet and maintain a healthy weight. ? Be sure to include plenty of vegetables, fruits, low-fat dairy products, and lean protein. ? Avoid eating foods that are high in solid fats, added sugars, or salt (sodium).  Get regular exercise. This is one of the most important things that you can do for your health. ? Try to exercise for at least 150 minutes each week. The type of exercise that you do should increase your heart rate and make you sweat. This is known as moderate-intensity exercise. ? Try to do strengthening exercises at least twice each week. Do these in addition to the moderate-intensity exercise.  Know your numbers.Ask your health care provider to check your cholesterol and your blood glucose. Continue to have your blood tested as directed by your health care provider.  What should I know about cancer screening? There are several types of cancer. Take the following steps to reduce your risk and to catch any cancer development as early as possible. Breast Cancer  Practice breast self-awareness. ? This means understanding how your breasts normally appear and feel. ? It also means doing regular breast self-exams. Let your health care provider know about any changes, no matter how small.  If you are 40  or older, have a clinician do a breast exam (clinical breast exam or CBE) every year. Depending on your age, family history, and medical history, it may be recommended that you also have a yearly breast X-ray (mammogram).  If you  have a family history of breast cancer, talk with your health care provider about genetic screening.  If you are at high risk for breast cancer, talk with your health care provider about having an MRI and a mammogram every year.  Breast cancer (BRCA) gene test is recommended for women who have family members with BRCA-related cancers. Results of the assessment will determine the need for genetic counseling and BRCA1 and for BRCA2 testing. BRCA-related cancers include these types: ? Breast. This occurs in males or females. ? Ovarian. ? Tubal. This may also be called fallopian tube cancer. ? Cancer of the abdominal or pelvic lining (peritoneal cancer). ? Prostate. ? Pancreatic.  Cervical, Uterine, and Ovarian Cancer Your health care provider may recommend that you be screened regularly for cancer of the pelvic organs. These include your ovaries, uterus, and vagina. This screening involves a pelvic exam, which includes checking for microscopic changes to the surface of your cervix (Pap test).  For women ages 21-65, health care providers may recommend a pelvic exam and a Pap test every three years. For women ages 79-65, they may recommend the Pap test and pelvic exam, combined with testing for human papilloma virus (HPV), every five years. Some types of HPV increase your risk of cervical cancer. Testing for HPV may also be done on women of any age who have unclear Pap test results.  Other health care providers may not recommend any screening for nonpregnant women who are considered low risk for pelvic cancer and have no symptoms. Ask your health care provider if a screening pelvic exam is right for you.  If you have had past treatment for cervical cancer or a condition that could lead to cancer, you need Pap tests and screening for cancer for at least 20 years after your treatment. If Pap tests have been discontinued for you, your risk factors (such as having a new sexual partner) need to be  reassessed to determine if you should start having screenings again. Some women have medical problems that increase the chance of getting cervical cancer. In these cases, your health care provider may recommend that you have screening and Pap tests more often.  If you have a family history of uterine cancer or ovarian cancer, talk with your health care provider about genetic screening.  If you have vaginal bleeding after reaching menopause, tell your health care provider.  There are currently no reliable tests available to screen for ovarian cancer.  Lung Cancer Lung cancer screening is recommended for adults 69-62 years old who are at high risk for lung cancer because of a history of smoking. A yearly low-dose CT scan of the lungs is recommended if you:  Currently smoke.  Have a history of at least 30 pack-years of smoking and you currently smoke or have quit within the past 15 years. A pack-year is smoking an average of one pack of cigarettes per day for one year.  Yearly screening should:  Continue until it has been 15 years since you quit.  Stop if you develop a health problem that would prevent you from having lung cancer treatment.  Colorectal Cancer  This type of cancer can be detected and can often be prevented.  Routine colorectal cancer screening usually begins at  age 69 and continues through age 13.  If you have risk factors for colon cancer, your health care provider may recommend that you be screened at an earlier age.  If you have a family history of colorectal cancer, talk with your health care provider about genetic screening.  Your health care provider may also recommend using home test kits to check for hidden blood in your stool.  A small camera at the end of a tube can be used to examine your colon directly (sigmoidoscopy or colonoscopy). This is done to check for the earliest forms of colorectal cancer.  Direct examination of the colon should be repeated every  5-10 years until age 43. However, if early forms of precancerous polyps or small growths are found or if you have a family history or genetic risk for colorectal cancer, you may need to be screened more often.  Skin Cancer  Check your skin from head to toe regularly.  Monitor any moles. Be sure to tell your health care provider: ? About any new moles or changes in moles, especially if there is a change in a mole's shape or color. ? If you have a mole that is larger than the size of a pencil eraser.  If any of your family members has a history of skin cancer, especially at a young age, talk with your health care provider about genetic screening.  Always use sunscreen. Apply sunscreen liberally and repeatedly throughout the day.  Whenever you are outside, protect yourself by wearing long sleeves, pants, a wide-brimmed hat, and sunglasses.  What should I know about osteoporosis? Osteoporosis is a condition in which bone destruction happens more quickly than new bone creation. After menopause, you may be at an increased risk for osteoporosis. To help prevent osteoporosis or the bone fractures that can happen because of osteoporosis, the following is recommended:  If you are 40-43 years old, get at least 1,000 mg of calcium and at least 600 mg of vitamin D per day.  If you are older than age 81 but younger than age 19, get at least 1,200 mg of calcium and at least 600 mg of vitamin D per day.  If you are older than age 64, get at least 1,200 mg of calcium and at least 800 mg of vitamin D per day.  Smoking and excessive alcohol intake increase the risk of osteoporosis. Eat foods that are rich in calcium and vitamin D, and do weight-bearing exercises several times each week as directed by your health care provider. What should I know about how menopause affects my mental health? Depression may occur at any age, but it is more common as you become older. Common symptoms of depression  include:  Low or sad mood.  Changes in sleep patterns.  Changes in appetite or eating patterns.  Feeling an overall lack of motivation or enjoyment of activities that you previously enjoyed.  Frequent crying spells.  Talk with your health care provider if you think that you are experiencing depression. What should I know about immunizations? It is important that you get and maintain your immunizations. These include:  Tetanus, diphtheria, and pertussis (Tdap) booster vaccine.  Influenza every year before the flu season begins.  Pneumonia vaccine.  Shingles vaccine.  Your health care provider may also recommend other immunizations. This information is not intended to replace advice given to you by your health care provider. Make sure you discuss any questions you have with your health care provider. Document Released: 02/01/2006  Document Revised: 06/29/2016 Document Reviewed: 09/13/2015 Elsevier Interactive Patient Education  [employer].

## 2018-04-01 NOTE — Assessment & Plan Note (Signed)
Currently not an issue Will monitor 

## 2018-04-02 LAB — PAIN MGMT, PROFILE 8 W/CONF, U
6 Acetylmorphine: NEGATIVE ng/mL (ref <10)
Alcohol Metabolites: NEGATIVE ng/mL (ref <500)
Amphetamines: NEGATIVE ng/mL (ref <500)
Benzodiazepines: NEGATIVE ng/mL (ref <100)
Buprenorphine, Urine: NEGATIVE ng/mL (ref <5)
Cocaine Metabolite: NEGATIVE ng/mL (ref <150)
Creatinine: 96 mg/dL
MDMA: NEGATIVE ng/mL (ref <500)
Marijuana Metabolite: NEGATIVE ng/mL (ref <20)
Opiates: NEGATIVE ng/mL (ref <100)
Oxidant: NEGATIVE ug/mL (ref <200)
Oxycodone: NEGATIVE ng/mL (ref <100)
pH: 6.7 (ref 4.5–9.0)

## 2018-06-03 ENCOUNTER — Ambulatory Visit (INDEPENDENT_AMBULATORY_CARE_PROVIDER_SITE_OTHER)
Admission: RE | Admit: 2018-06-03 | Discharge: 2018-06-03 | Disposition: A | Discharge status: Home / Self Care | Payer: BLUE CROSS/BLUE SHIELD | Source: Ambulatory Visit | Attending: Internal Medicine | Admitting: Internal Medicine

## 2018-06-03 ENCOUNTER — Encounter: Payer: Self-pay | Admitting: Internal Medicine

## 2018-06-03 ENCOUNTER — Encounter (INDEPENDENT_AMBULATORY_CARE_PROVIDER_SITE_OTHER): Payer: Self-pay

## 2018-06-03 ENCOUNTER — Ambulatory Visit: Payer: BLUE CROSS/BLUE SHIELD | Admitting: Internal Medicine

## 2018-06-03 VITALS — BP 122/88 | HR 70 | Temp 98.0°F | Wt 207.0 lb

## 2018-06-03 DIAGNOSIS — R1031 Right lower quadrant pain: Secondary | ICD-10-CM

## 2018-06-03 DIAGNOSIS — R109 Unspecified abdominal pain: Secondary | ICD-10-CM | POA: Diagnosis not present

## 2018-06-03 DIAGNOSIS — Z87442 Personal history of urinary calculi: Secondary | ICD-10-CM | POA: Diagnosis not present

## 2018-06-03 DIAGNOSIS — R1030 Lower abdominal pain, unspecified: Secondary | ICD-10-CM | POA: Diagnosis not present

## 2018-06-03 LAB — POC URINALSYSI DIPSTICK (AUTOMATED)
Bilirubin, UA: NEGATIVE
Glucose, UA: NEGATIVE
Ketones, UA: NEGATIVE
Leukocytes, UA: NEGATIVE
Nitrite, UA: NEGATIVE
Protein, UA: NEGATIVE
Spec Grav, UA: 1.01 (ref 1.010–1.025)
Urobilinogen, UA: 0.2 E.U./dL
pH, UA: 6 (ref 5.0–8.0)

## 2018-06-03 MED ORDER — HYDROCODONE-ACETAMINOPHEN 5-325 MG PO TABS: 1.0000 | ORAL_TABLET | Freq: Three times a day (TID) | ORAL | 0 refills | Status: DC | PRN

## 2018-06-03 MED ORDER — TAMSULOSIN HCL 0.4 MG PO CAPS: 0.4000 mg | ORAL_CAPSULE | Freq: Every day | ORAL | 0 refills | Status: DC

## 2018-06-03 NOTE — Addendum Note (Signed)
Addended by: Lurlean Nanny on: 06/03/2018 02:56 PM   Modules accepted: Orders

## 2018-06-03 NOTE — Progress Notes (Signed)
Subjective:    Patient ID: [name], female    DOB: 1966/10/29, 52 y.o.   MRN: [id number]  HPI  Pt presents to the clinic today with c/o right lower abdominal pain. She reports this started 4 days ago. She describes the pain as "gnawing".  The pain radiates to the right flank. She denies fever, chills, nausea. She denies urgency, frequency, dysuria or blood in her urine. She denies vaginal complaints. She has tried Tylenol and Ibuprofen with minimal relief. She has a history of kidney stones, but reports this feels a little different. She has had a hysterectomy in the past.  Review of Systems      Past Medical History:  Diagnosis Date  . Allergy   . Anxiety   . Depression   . GERD (gastroesophageal reflux disease)   . History of abnormal cervical Pap smear    1991 -- 1995--hx cryotherapy to cervix  . History of colon polyps    2008- BENIGN  . Hydronephrosis, left   . Kidney stones 2016  . PONV (postoperative nausea and vomiting)     Current Outpatient Medications  Medication Sig Dispense Refill  . acetaminophen (TYLENOL) 500 MG tablet Take 1,000 mg by mouth every 6 (six) hours as needed for mild pain, moderate pain, fever or headache.    . ALPRAZolam (XANAX) 0.5 MG tablet Take 1 tablet (0.5 mg total) by mouth at bedtime as needed for anxiety or sleep. 20 tablet 0  . buPROPion (WELLBUTRIN XL) 300 MG 24 hr tablet Take 1 tablet (300 mg total) by mouth daily. 90 tablet 0  . cholecalciferol (VITAMIN D) 1000 units tablet Take 1,000 Units by mouth daily.    Marland Kitchen FLUoxetine (PROZAC) 10 MG tablet Take 1 tablet (10 mg total) by mouth daily. 30 tablet 2  . Multiple Vitamin (MULTIVITAMIN WITH MINERALS) TABS tablet Take 1 tablet by mouth daily.    Marland Kitchen omega-3 acid ethyl esters (LOVAZA) 1 g capsule Take 1 g by mouth daily.     No current facility-administered medications for this visit.     Allergies  Allergen Reactions  . Desvenlafaxine Other (See Comments)    Reaction:  Withdrawal     . Iodinated Diagnostic Agents Rash    Previously mis-entered as Iohexol allergy. Patient is not allergic to Non-Ionic contrast currently.    Family History  Problem Relation Age of Onset  . Hypertension Mother   . Colon polyps Mother   . Diabetes Father   . Colon polyps Father   . Cancer Paternal Uncle        Pancreas  . Pancreatic cancer Paternal Uncle   . Heart attack Maternal Grandmother   . Drug abuse Sister   . Esophageal cancer Neg Hx   . Stomach cancer Neg Hx   . Rectal cancer Neg Hx   . Colon cancer Neg Hx     Social History   Socioeconomic History  . Marital status: Married    Spouse name: Not on file  . Number of children: Not on file  . Years of education: Not on file  . Highest education level: Not on file  Occupational History  . Not on file  Social Needs  . Financial resource strain: Not on file  . Food insecurity:    Worry: Not on file    Inability: Not on file  . Transportation needs:    Medical: Not on file    Non-medical: Not on file  Tobacco Use  . Smoking status:  Former Smoker    Years: 15.00    Types: Cigarettes    Last attempt to quit: 05/16/2008    Years since quitting: 10.0  . Smokeless tobacco: Never Used  Substance and Sexual Activity  . Alcohol use: No    Alcohol/week: 0.0 oz  . Drug use: No  . Sexual activity: Yes    Partners: Male    Birth control/protection: Surgical    Comment: Hyst  Lifestyle  . Physical activity:    Days per week: Not on file    Minutes per session: Not on file  . Stress: Not on file  Relationships  . Social connections:    Talks on phone: Not on file    Gets together: Not on file    Attends religious service: Not on file    Active member of club or organization: Not on file    Attends meetings of clubs or organizations: Not on file    Relationship status: Not on file  . Intimate partner violence:    Fear of current or ex partner: Not on file    Emotionally abused: Not on file    Physically  abused: Not on file    Forced sexual activity: Not on file  Other Topics Concern  . Not on file  Social History Narrative  . Not on file     Constitutional: Denies fever, malaise, fatigue, headache or abrupt weight changes.  Respiratory: Denies difficulty breathing, shortness of breath, cough or sputum production.   Cardiovascular: Denies chest pain, chest tightness, palpitations or swelling in the hands or feet.  Gastrointestinal: Pt reports abdominal pain, nausea. Denies bloating, constipation, diarrhea or blood in the stool.  GU: Pt reports flank pain. Denies urgency, frequency, pain with urination, burning sensation, blood in urine, odor or discharge.  No other specific complaints in a complete review of systems (except as listed in HPI above).  Objective:   Physical Exam   BP 122/88   Pulse 70   Temp 98 F (36.7 C) (Oral)   Wt 207 lb (93.9 kg)   LMP 12/24/2006 (Approximate)   SpO2 97%   BMI 33.92 kg/m  Wt Readings from Last 3 Encounters:  06/03/18 207 lb (93.9 kg)  04/01/18 200 lb (90.7 kg)  01/10/18 208 lb 2 oz (94.4 kg)    General: Appears her stated age, obese, in NAD. Skin: Warm, dry and intact. No rashes noted. Cardiovascular: Normal rate and rhythm.  Pulmonary/Chest: Normal effort and positive vesicular breath sounds. No respiratory distress. No wheezes, rales or ronchi noted.  Abdomen: Soft and tender in the RLQ. Normal bowel sounds. No distention or masses noted. No CVA tenderness noted. Neurological: Alert and oriented.   BMET    Component Value Date/Time   NA 139 04/01/2018 0857   K 4.6 04/01/2018 0857   CL 104 04/01/2018 0857   CO2 30 04/01/2018 0857   GLUCOSE 98 04/01/2018 0857   BUN 15 04/01/2018 0857   CREATININE 0.82 04/01/2018 0857   CALCIUM 9.2 04/01/2018 0857   GFRNONAA >60 11/15/2017 2142   GFRAA >60 11/15/2017 2142    Lipid Panel     Component Value Date/Time   CHOL 145 04/01/2018 0857   TRIG 130.0 04/01/2018 0857   HDL 35.90  (L) 04/01/2018 0857   CHOLHDL 4 04/01/2018 0857   VLDL 26.0 04/01/2018 0857   LDLCALC 83 04/01/2018 0857    CBC    Component Value Date/Time   WBC 5.6 04/01/2018 0857   RBC 4.52  04/01/2018 0857   HGB 13.2 04/01/2018 0857   HCT 38.5 04/01/2018 0857   PLT 237.0 04/01/2018 0857   MCV 85.2 04/01/2018 0857   MCH 28.9 11/15/2017 2142   MCHC 34.2 04/01/2018 0857   RDW 13.5 04/01/2018 0857   LYMPHSABS 3.0 05/31/2017 2332   MONOABS 0.7 05/31/2017 2332   EOSABS 0.1 05/31/2017 2332   BASOSABS 0.0 05/31/2017 2332    Hgb A1C Lab Results  Component Value Date   HGBA1C 5.8 03/18/2017           Assessment & Plan:   RLQ Pain, Right Flank Pain:  Urinalysis: trace blood Will send urine culture KUB to eval for possible kidney stone Push fluids eRx for Flomax 0.4 mg daily  RX for Hydrocodone 5-325 mg TID prn- sedation caution given Strainer provided  Will follow up after imaging, return precautions discussed Webb Silversmith, NP

## 2018-06-03 NOTE — Patient Instructions (Signed)

## 2018-06-04 ENCOUNTER — Encounter: Payer: Self-pay | Admitting: Internal Medicine

## 2018-06-04 ENCOUNTER — Other Ambulatory Visit: Payer: Self-pay | Admitting: Internal Medicine

## 2018-06-04 DIAGNOSIS — R1031 Right lower quadrant pain: Secondary | ICD-10-CM

## 2018-06-05 ENCOUNTER — Ambulatory Visit (INDEPENDENT_AMBULATORY_CARE_PROVIDER_SITE_OTHER)
Admission: RE | Admit: 2018-06-05 | Discharge: 2018-06-05 | Disposition: A | Discharge status: Home / Self Care | Payer: BLUE CROSS/BLUE SHIELD | Source: Ambulatory Visit | Attending: Internal Medicine | Admitting: Internal Medicine

## 2018-06-05 DIAGNOSIS — R1031 Right lower quadrant pain: Secondary | ICD-10-CM | POA: Diagnosis not present

## 2018-06-05 DIAGNOSIS — K429 Umbilical hernia without obstruction or gangrene: Secondary | ICD-10-CM | POA: Diagnosis not present

## 2018-06-05 LAB — URINE CULTURE
MICRO NUMBER:: 90699502
SPECIMEN QUALITY:: ADEQUATE

## 2018-08-05 ENCOUNTER — Ambulatory Visit (INDEPENDENT_AMBULATORY_CARE_PROVIDER_SITE_OTHER): Payer: BLUE CROSS/BLUE SHIELD | Admitting: Internal Medicine

## 2018-08-05 ENCOUNTER — Encounter: Payer: Self-pay | Admitting: Internal Medicine

## 2018-08-05 VITALS — BP 122/78 | HR 73 | Temp 98.4°F | Wt 210.0 lb

## 2018-08-05 DIAGNOSIS — G44209 Tension-type headache, unspecified, not intractable: Secondary | ICD-10-CM | POA: Diagnosis not present

## 2018-08-05 MED ORDER — KETOROLAC TROMETHAMINE 30 MG/ML IJ SOLN
30.0000 mg | Freq: Once | INTRAMUSCULAR | Status: AC
  Administered 2018-08-05: 30 mg via INTRAMUSCULAR

## 2018-08-05 MED ORDER — METHOCARBAMOL 500 MG PO TABS: 500.0000 mg | ORAL_TABLET | Freq: Every evening | ORAL | 0 refills | Status: DC | PRN

## 2018-08-05 NOTE — Addendum Note (Signed)
Addended by: Lurlean Nanny on: 08/05/2018 08:39 AM   Modules accepted: Orders

## 2018-08-05 NOTE — Progress Notes (Signed)
Subjective:    Patient ID: [name], female    DOB: [birth year], 52 y.o.   MRN: [id number]  HPI  Pt presents to the clinic today with c/o frequent headaches. She reports this started 1 month ago. The pain is located on the top of her head and at the base of her skull. She describes the pain as throbbing, crushing. She also has a lot of tension in her neck and shoulders. She denies visual changes, dizziness, numbness or tingling in her arms. She has had a recent increase in stress. She has also been forgetting to take her Prozac which she is not sure if that is a contributing factor. She has tried Tylenol, Ibuprofen and Excedrin with some relief.   Review of Systems      Past Medical History:  Diagnosis Date  . Allergy   . Anxiety   . Depression   . GERD (gastroesophageal reflux disease)   . History of abnormal cervical Pap smear    1991 -- 1995--hx cryotherapy to cervix  . History of colon polyps    2008- BENIGN  . Hydronephrosis, left   . Kidney stones 2016  . PONV (postoperative nausea and vomiting)     Current Outpatient Medications  Medication Sig Dispense Refill  . acetaminophen (TYLENOL) 500 MG tablet Take 1,000 mg by mouth every 6 (six) hours as needed for mild pain, moderate pain, fever or headache.    . ALPRAZolam (XANAX) 0.5 MG tablet Take 1 tablet (0.5 mg total) by mouth at bedtime as needed for anxiety or sleep. 20 tablet 0  . buPROPion (WELLBUTRIN XL) 300 MG 24 hr tablet Take 1 tablet (300 mg total) by mouth daily. 90 tablet 0  . cholecalciferol (VITAMIN D) 1000 units tablet Take 1,000 Units by mouth daily.    Marland Kitchen FLUoxetine (PROZAC) 10 MG tablet Take 1 tablet (10 mg total) by mouth daily. 30 tablet 2  . HYDROcodone-acetaminophen (NORCO/VICODIN) 5-325 MG tablet Take 1 tablet by mouth every 8 (eight) hours as needed for moderate pain. 15 tablet 0  . Multiple Vitamin (MULTIVITAMIN WITH MINERALS) TABS tablet Take 1 tablet by mouth daily.    Marland Kitchen omega-3 acid ethyl  esters (LOVAZA) 1 g capsule Take 1 g by mouth daily.    . tamsulosin (FLOMAX) 0.4 MG CAPS capsule Take 1 capsule (0.4 mg total) by mouth daily. 14 capsule 0   No current facility-administered medications for this visit.     Allergies  Allergen Reactions  . Desvenlafaxine Other (See Comments)    Reaction:  Withdrawal   . Iodinated Diagnostic Agents Rash    Previously mis-entered as Iohexol allergy. Patient is not allergic to Non-Ionic contrast currently.    Family History  Problem Relation Age of Onset  . Hypertension Mother   . Colon polyps Mother   . Diabetes Father   . Colon polyps Father   . Cancer Paternal Uncle        Pancreas  . Pancreatic cancer Paternal Uncle   . Heart attack Maternal Grandmother   . Drug abuse Sister   . Esophageal cancer Neg Hx   . Stomach cancer Neg Hx   . Rectal cancer Neg Hx   . Colon cancer Neg Hx     Social History   Socioeconomic History  . Marital status: Married    Spouse name: Not on file  . Number of children: Not on file  . Years of education: Not on file  . Highest education level: Not on  file  Occupational History  . Not on file  Social Needs  . Financial resource strain: Not on file  . Food insecurity:    Worry: Not on file    Inability: Not on file  . Transportation needs:    Medical: Not on file    Non-medical: Not on file  Tobacco Use  . Smoking status: Former Smoker    Years: 15.00    Types: Cigarettes    Last attempt to quit: 05/16/2008    Years since quitting: 10.2  . Smokeless tobacco: Never Used  Substance and Sexual Activity  . Alcohol use: No    Alcohol/week: 0.0 standard drinks  . Drug use: No  . Sexual activity: Yes    Partners: Male    Birth control/protection: Surgical    Comment: Hyst  Lifestyle  . Physical activity:    Days per week: Not on file    Minutes per session: Not on file  . Stress: Not on file  Relationships  . Social connections:    Talks on phone: Not on file    Gets together:  Not on file    Attends religious service: Not on file    Active member of club or organization: Not on file    Attends meetings of clubs or organizations: Not on file    Relationship status: Not on file  . Intimate partner violence:    Fear of current or ex partner: Not on file    Emotionally abused: Not on file    Physically abused: Not on file    Forced sexual activity: Not on file  Other Topics Concern  . Not on file  Social History Narrative  . Not on file     Constitutional: Pt reports headache. Denies fever, malaise, fatigue, or abrupt weight changes.  HEENT: Denies eye pain, eye redness, ear pain, ringing in the ears, wax buildup, runny nose, nasal congestion, bloody nose, or sore throat. Musculoskeletal: Pt reports tension in neck. Denies decrease in range of motion, difficulty with gait, or joint pain and swelling.  SNeurological: Denies dizziness, difficulty with memory, difficulty with speech or problems with balance and coordination.  Psych: Pt reports history of anxiety and depression, increased stress. Denies SI/HI.  No other specific complaints in a complete review of systems (except as listed in HPI above).  Objective:   Physical Exam   BP 122/78   Pulse 73   Temp 98.4 F (36.9 C) (Oral)   Wt 210 lb (95.3 kg)   LMP 12/24/2006 (Approximate)   SpO2 98%   BMI 34.41 kg/m  Wt Readings from Last 3 Encounters:  08/05/18 210 lb (95.3 kg)  06/03/18 207 lb (93.9 kg)  04/01/18 200 lb (90.7 kg)    General: Appears her stated age, obese in NAD. HEENT: Head: normal shape and size; Eyes: sclera white, no icterus, conjunctiva pink, PERRLA and EOMs intact, no nystgmus; Ears: Tm's gray and intact, normal light reflex;  Musculoskeletal: Normal flexion, extension and rotation of the cervical spine. No bony tenderness noted over the spine. Tension noted of the paracervical muscles and upper trapezius. Neurological: Alert and oriented. Coordination normal.    BMET      Component Value Date/Time   NA 139 04/01/2018 0857   K 4.6 04/01/2018 0857   CL 104 04/01/2018 0857   CO2 30 04/01/2018 0857   GLUCOSE 98 04/01/2018 0857   BUN 15 04/01/2018 0857   CREATININE 0.82 04/01/2018 0857   CALCIUM 9.2 04/01/2018 0857  GFRNONAA >60 11/15/2017 2142   GFRAA >60 11/15/2017 2142    Lipid Panel     Component Value Date/Time   CHOL 145 04/01/2018 0857   TRIG 130.0 04/01/2018 0857   HDL 35.90 (L) 04/01/2018 0857   CHOLHDL 4 04/01/2018 0857   VLDL 26.0 04/01/2018 0857   LDLCALC 83 04/01/2018 0857    CBC    Component Value Date/Time   WBC 5.6 04/01/2018 0857   RBC 4.52 04/01/2018 0857   HGB 13.2 04/01/2018 0857   HCT 38.5 04/01/2018 0857   PLT 237.0 04/01/2018 0857   MCV 85.2 04/01/2018 0857   MCH 28.9 11/15/2017 2142   MCHC 34.2 04/01/2018 0857   RDW 13.5 04/01/2018 0857   LYMPHSABS 3.0 05/31/2017 2332   MONOABS 0.7 05/31/2017 2332   EOSABS 0.1 05/31/2017 2332   BASOSABS 0.0 05/31/2017 2332    Hgb A1C Lab Results  Component Value Date   HGBA1C 5.8 03/18/2017           Assessment & Plan:   Tension Headache:  30 mg Toradol IM today eRx for Methocarbamol at night- sedation caution given Heat and massage may be helpful Ok to continue Ibuprofen, Tylenol or Excedrin  Return precautions discussed Webb Silversmith, NP

## 2018-08-05 NOTE — Patient Instructions (Signed)
Tension Headache A tension headache is pain, pressure, or aching that is felt over the front and sides of your head. These headaches can last from 30 minutes to several days. Follow these instructions at home: Managing pain  Take over-the-counter and prescription medicines only as told by your doctor.  Lie down in a dark, quiet room when you have a headache.  If directed, apply ice to your head and neck area: ? Put ice in a plastic bag. ? Place a towel between your skin and the bag. ? Leave the ice on for 20 minutes, 2-3 times per day.  Use a heating pad or a hot shower to apply heat to your head and neck area as told by your doctor. Eating and drinking  Eat meals on a regular schedule.  Do not drink a lot of alcohol.  Do not use a lot of caffeine, or stop using caffeine. General instructions  Keep all follow-up visits as told by your doctor. This is important.  Keep a journal to find out if certain things bring on headaches. For example, write down: ? What you eat and drink. ? How much sleep you get. ? Any change to your diet or medicines.  Try getting a massage, or doing other things that help you to relax.  Lessen stress.  Sit up straight. Do not tighten (tense) your muscles.  Do not use tobacco products. This includes cigarettes, chewing tobacco, or e-cigarettes. If you need help quitting, ask your doctor.  Exercise regularly as told by your doctor.  Get enough sleep. This may mean 7-9 hours of sleep. Contact a doctor if:  Your symptoms are not helped by medicine.  You have a headache that feels different from your usual headache.  You feel sick to your stomach (nauseous) or you throw up (vomit).  You have a fever. Get help right away if:  Your headache becomes very bad.  You keep throwing up.  You have a stiff neck.  You have trouble seeing.  You have trouble speaking.  You have pain in your eye or ear.  Your muscles are weak or you lose muscle  control.  You lose your balance or you have trouble walking.  You feel like you will pass out (faint) or you pass out.  You have confusion. This information is not intended to replace advice given to you by your health care provider. Make sure you discuss any questions you have with your health care provider. Document Released: 03/06/2010 Document Revised: 08/09/2016 Document Reviewed: 04/04/2015 Elsevier Interactive Patient Education  2018 Elsevier Inc.  

## 2018-08-08 ENCOUNTER — Encounter: Payer: Self-pay | Admitting: Internal Medicine

## 2018-08-12 ENCOUNTER — Ambulatory Visit: Payer: BLUE CROSS/BLUE SHIELD | Admitting: Internal Medicine

## 2018-08-23 ENCOUNTER — Other Ambulatory Visit: Payer: Self-pay | Admitting: Internal Medicine

## 2018-08-26 MED ORDER — FLUOXETINE HCL 10 MG PO TABS: 10.0000 mg | ORAL_TABLET | Freq: Every day | ORAL | 2 refills | Status: DC

## 2018-08-26 MED ORDER — BUPROPION HCL ER (XL) 300 MG PO TB24: 300.0000 mg | ORAL_TABLET | Freq: Every day | ORAL | 0 refills | Status: DC

## 2018-10-02 DIAGNOSIS — Z23 Encounter for immunization: Secondary | ICD-10-CM | POA: Diagnosis not present

## 2018-10-20 ENCOUNTER — Other Ambulatory Visit: Payer: Self-pay

## 2018-10-20 ENCOUNTER — Emergency Department (HOSPITAL_COMMUNITY): Payer: BLUE CROSS/BLUE SHIELD

## 2018-10-20 ENCOUNTER — Encounter (HOSPITAL_COMMUNITY): Payer: Self-pay

## 2018-10-20 ENCOUNTER — Emergency Department (HOSPITAL_COMMUNITY)
Admission: EM | Admit: 2018-10-20 | Discharge: 2018-10-20 | Disposition: A | Discharge status: Home / Self Care | Payer: BLUE CROSS/BLUE SHIELD | Attending: Emergency Medicine | Admitting: Emergency Medicine

## 2018-10-20 DIAGNOSIS — Z87891 Personal history of nicotine dependence: Secondary | ICD-10-CM | POA: Diagnosis not present

## 2018-10-20 DIAGNOSIS — R51 Headache: Secondary | ICD-10-CM | POA: Diagnosis not present

## 2018-10-20 DIAGNOSIS — Z79899 Other long term (current) drug therapy: Secondary | ICD-10-CM | POA: Diagnosis not present

## 2018-10-20 DIAGNOSIS — R519 Headache, unspecified: Secondary | ICD-10-CM

## 2018-10-20 MED ORDER — PROCHLORPERAZINE EDISYLATE 10 MG/2ML IJ SOLN
10.0000 mg | Freq: Once | INTRAMUSCULAR | Status: AC
  Administered 2018-10-20: 10 mg via INTRAVENOUS
  Filled 2018-10-20: qty 2

## 2018-10-20 MED ORDER — SODIUM CHLORIDE 0.9 % IV BOLUS
1000.0000 mL | Freq: Once | INTRAVENOUS | Status: AC
  Administered 2018-10-20: 1000 mL via INTRAVENOUS

## 2018-10-20 MED ORDER — DEXAMETHASONE SODIUM PHOSPHATE 10 MG/ML IJ SOLN
10.0000 mg | Freq: Once | INTRAMUSCULAR | Status: AC
  Administered 2018-10-20: 10 mg via INTRAVENOUS
  Filled 2018-10-20: qty 1

## 2018-10-20 MED ORDER — KETOROLAC TROMETHAMINE 30 MG/ML IJ SOLN
30.0000 mg | Freq: Once | INTRAMUSCULAR | Status: AC
  Administered 2018-10-20: 30 mg via INTRAVENOUS
  Filled 2018-10-20: qty 1

## 2018-10-20 MED ORDER — DIPHENHYDRAMINE HCL 50 MG/ML IJ SOLN
25.0000 mg | Freq: Once | INTRAMUSCULAR | Status: AC
  Administered 2018-10-20: 25 mg via INTRAVENOUS
  Filled 2018-10-20: qty 1

## 2018-10-20 NOTE — ED Provider Notes (Signed)
[facility] DEPT Provider Note   CSN: [id number] Arrival date & time: 10/20/18  0998     History   Chief Complaint Chief Complaint  Patient presents with  . Headache    HPI [name] is a 52 y.o. female.  HPI  Patient presents to the emergency department with significant headache with nausea light sensitivity that started around 4 AM.  The patient states she did not take any medications prior to arrival for symptoms.  Patient states that nothing seems to make the condition better.  The patient states that she has had headaches in the past but this seems more severe than previous.  The patient denies chest pain, shortness of breath,  neck pain, fever, cough, weakness, numbness, dizziness, anorexia, edema, abdominal pain, nausea, vomiting, diarrhea, rash, back pain, dysuria, hematemesis, bloody stool, near syncope, or syncope. Past Medical History:  Diagnosis Date  . Allergy   . Anxiety   . Depression   . GERD (gastroesophageal reflux disease)   . History of abnormal cervical Pap smear    1991 -- 1995--hx cryotherapy to cervix  . History of colon polyps    2008- BENIGN  . Hydronephrosis, left   . Kidney stones 2016  . PONV (postoperative nausea and vomiting)     Patient Active Problem List   Diagnosis Date Noted  . HLD (hyperlipidemia) 04/01/2018  . GERD (gastroesophageal reflux disease) 04/01/2018  . Anxiety and depression 11/01/2014    Past Surgical History:  Procedure Laterality Date  . ABDOMINAL HYSTERECTOMY    . ANTERIOR AND POSTERIOR REPAIR WITH SACROSPINOUS FIXATION N/A 08/16/2015   Procedure: ANTERIOR COLPORRHAPHY WITH XENOFORM GRAFT AND SACROSPINOUS FIXATION;  Surgeon: Nunzio Cobbs, MD;  Location: [facility];  Service: Gynecology;  Laterality: N/A;  2.5 hours OR time  . BLADDER SUSPENSION N/A 08/16/2015   Procedure: TRANSVAGINAL TAPE (TVT) PROCEDURE exact midurethral sling;  Surgeon: Nunzio Cobbs, MD;   Location: [facility];  Service: Gynecology;  Laterality: N/A;  . COLONOSCOPY    . COLONOSCOPY W/ POLYPECTOMY  05-30-2007  . CYSTOSCOPY N/A 08/16/2015   Procedure: CYSTOSCOPY;  Surgeon: Nunzio Cobbs, MD;  Location: [facility];  Service: Gynecology;  Laterality: N/A;  . CYSTOSCOPY W/ RETROGRADES Bilateral 06/22/2015   Procedure: CYSTOSCOPY WITH RETROGRADE PYELOGRAM;  Surgeon: Cleon Gustin, MD;  Location: [facility];  Service: Urology;  Laterality: Bilateral;  . CYSTOSCOPY W/ URETERAL STENT PLACEMENT  02/  2000  . CYSTOSCOPY WITH HOLMIUM LASER LITHOTRIPSY Left 05/18/2015   Procedure: CYSTOSCOPY WITH HOLMIUM LASER LITHOTRIPSY;  Surgeon: Alexis Frock, MD;  Location: [facility];  Service: Urology;  Laterality: Left;  . CYSTOSCOPY WITH RETROGRADE PYELOGRAM, URETEROSCOPY AND STENT PLACEMENT Left 06/22/2015   Procedure: CYSTOSCOPY,  LEFT URETEROSCOPY;  Surgeon: Cleon Gustin, MD;  Location: [facility];  Service: Urology;  Laterality: Left;  . CYSTOSCOPY WITH URETEROSCOPY AND STENT PLACEMENT Left 05/18/2015   Procedure: CYSTOSCOPY WITH URETEROSCOPY, STONE MANIPULATION AND STENT PLACEMENT;  Surgeon: Alexis Frock, MD;  Location: [facility];  Service: Urology;  Laterality: Left;  . DIAGNOSTIC LAPAROSCOPY    . DX LAPAROSCOPY  X2  . ESOPHAGOGASTRODUODENOSCOPY  05-09-2007  . EXPLORATORY LAPAROTOMY W/ BILATERAL SALPINGECTOMY AND REMOVAL RIGHT ECTOPIC PREG.  02-23-2004  . EXTRACORPOREAL SHOCK WAVE LITHOTRIPSY  2001  &  2002  . LAPAROSCOPIC CHOLECYSTECTOMY  1999  . POLYPECTOMY    . SHOULDER ARTHROSCOPY WITH OPEN ROTATOR CUFF REPAIR Right  2012  . TOTAL ABDOMINAL HYSTERECTOMY W/ BILATERAL OOPHORECTOMY AND LYSIS ADHESIONS  09-02-2007  . TUBAL LIGATION Bilateral 1995  . UMBILICAL HERNIA REPAIR  2003  . VAGINAL HYSTERECTOMY N/A 08/18/2015   Procedure: Exam under Anesthesia, Excision Xenform Graft, Removal Bilateral Sacrospinous Ligament  sutures;  Surgeon: Nunzio Cobbs, MD;  Location: [facility];  Service: Gynecology;  Laterality: N/A;     OB History    Gravida  3   Para  2   Term  2   Preterm      AB  1   Living  2     SAB      TAB      Ectopic  1   Multiple      Live Births               Home Medications    Prior to Admission medications   Medication Sig Start Date End Date Taking? Authorizing Provider  acetaminophen (TYLENOL) 500 MG tablet Take 1,000 mg by mouth every 6 (six) hours as needed for mild pain, moderate pain, fever or headache.   Yes [provider]  ALPRAZolam (XANAX) 0.5 MG tablet Take 1 tablet (0.5 mg total) by mouth at bedtime as needed for anxiety or sleep. 01/01/18  Yes Jearld Fenton, NP  buPROPion (WELLBUTRIN XL) 300 MG 24 hr tablet Take 1 tablet (300 mg total) by mouth daily. 08/26/18  Yes Jearld Fenton, NP  FLUoxetine (PROZAC) 10 MG tablet Take 1 tablet (10 mg total) by mouth daily. 08/26/18  Yes Jearld Fenton, NP  Multiple Vitamin (MULTIVITAMIN WITH MINERALS) TABS tablet Take 1 tablet by mouth daily.   Yes [provider]  HYDROcodone-acetaminophen (NORCO/VICODIN) 5-325 MG tablet Take 1 tablet by mouth every 8 (eight) hours as needed for moderate pain. Patient not taking: Reported on 10/20/2018 06/03/18   Jearld Fenton, NP  methocarbamol (ROBAXIN) 500 MG tablet Take 1 tablet (500 mg total) by mouth at bedtime as needed for muscle spasms. Patient not taking: Reported on 10/20/2018 08/05/18   Jearld Fenton, NP  tamsulosin (FLOMAX) 0.4 MG CAPS capsule Take 1 capsule (0.4 mg total) by mouth daily. Patient not taking: Reported on 10/20/2018 06/03/18   Jearld Fenton, NP    Family History Family History  Problem Relation Age of Onset  . Hypertension Mother   . Colon polyps Mother   . Diabetes Father   . Colon polyps Father   . Cancer Paternal Uncle        Pancreas  . Pancreatic cancer Paternal Uncle   . Heart attack Maternal Grandmother   .  Drug abuse Sister   . Esophageal cancer Neg Hx   . Stomach cancer Neg Hx   . Rectal cancer Neg Hx   . Colon cancer Neg Hx     Social History Social History   Tobacco Use  . Smoking status: Former Smoker    Years: 15.00    Types: Cigarettes    Last attempt to quit: 05/16/2008    Years since quitting: 10.4  . Smokeless tobacco: Never Used  Substance Use Topics  . Alcohol use: No    Alcohol/week: 0.0 standard drinks  . Drug use: No     Allergies   Desvenlafaxine and Iodinated diagnostic agents   Review of Systems Review of Systems All other systems negative except as documented in the HPI. All pertinent positives and negatives as reviewed in the HPI. Physical Exam Updated Vital  Signs BP 138/80 (BP Location: Right Arm)   Pulse 79   Temp 98.1 F (36.7 C) (Oral)   Resp 15   Ht 5\' 6"  (1.676 m)   Wt 98.9 kg   LMP 12/24/2006 (Approximate)   SpO2 100%   BMI 35.19 kg/m   Physical Exam  Constitutional: She is oriented to person, place, and time. She appears well-developed and well-nourished. No distress.  HENT:  Head: Normocephalic and atraumatic.  Mouth/Throat: Oropharynx is clear and moist.  Eyes: Pupils are equal, round, and reactive to light.  Neck: Normal range of motion. Neck supple.  Cardiovascular: Normal rate, regular rhythm and normal heart sounds. Exam reveals no gallop and no friction rub.  No murmur heard. Pulmonary/Chest: Effort normal and breath sounds normal. No respiratory distress. She has no wheezes.  Abdominal: Soft. Bowel sounds are normal. She exhibits no distension. There is no tenderness.  Neurological: She is alert and oriented to person, place, and time. She exhibits normal muscle tone. Coordination normal.  Skin: Skin is warm and dry. Capillary refill takes less than 2 seconds. No rash noted. No erythema.  Psychiatric: She has a normal mood and affect. Her behavior is normal.  Nursing note and vitals reviewed.    ED Treatments / Results    Labs (all labs ordered are listed, but only abnormal results are displayed) Labs Reviewed - No data to display  EKG None  Radiology Ct Head Wo Contrast  Result Date: 10/20/2018 CLINICAL DATA:  Headaches EXAM: CT HEAD WITHOUT CONTRAST TECHNIQUE: Contiguous axial images were obtained from the base of the skull through the vertex without intravenous contrast. COMPARISON:  04/05/2014 FINDINGS: Brain: No evidence of acute infarction, hemorrhage, hydrocephalus, extra-axial collection or mass lesion/mass effect. Vascular: No hyperdense vessel or unexpected calcification. Skull: Stable chronic scalloping is again noted in the right frontal calvarium with underlying arachnoid cyst. Calcific exostosis is noted in the left occipital region as well as an adjacent partially calcified sebaceous cyst stable in appearance from the prior exam. Sinuses/Orbits: Mucosal retention cyst within the right maxillary antrum is noted. No other focal abnormality is seen. Other: None. IMPRESSION: Chronic changes similar to that seen on the prior exam. No acute intracranial abnormality noted. Electronically Signed   By: Inez Catalina [name]   On: 10/20/2018 11:46    Procedures Procedures (including critical care time)  Medications Ordered in ED Medications  sodium chloride 0.9 % bolus 1,000 mL (0 mLs Intravenous Stopped 10/20/18 1015)  prochlorperazine (COMPAZINE) injection 10 mg (10 mg Intravenous Given 10/20/18 0921)  diphenhydrAMINE (BENADRYL) injection 25 mg (25 mg Intravenous Given 10/20/18 0921)  ketorolac (TORADOL) 30 MG/ML injection 30 mg (30 mg Intravenous Given 10/20/18 1207)  dexamethasone (DECADRON) injection 10 mg (10 mg Intravenous Given 10/20/18 1208)     Initial Impression / Assessment and Plan / ED Course  I have reviewed the triage vital signs and the nursing notes.  Pertinent labs & imaging results that were available during my care of the patient were reviewed by me and considered in my medical  decision making (see chart for details).     Patient has a negative CT scan of her head.  I did warn her that this could change.  She would need to return for any worsening in her condition.  Patient was given a migraine cocktail of Toradol, Compazine, Benadryl, Decadron with IV fluids and has resolution of her headache.  Final Clinical Impressions(s) / ED Diagnoses   Final diagnoses:  None  ED Discharge Orders    None       Dalia Heading, PA-C 10/21/18 1603    Blanchie Dessert, MD 10/22/18 2024

## 2018-10-20 NOTE — Discharge Instructions (Addendum)
Return here as needed. Follow up with your doctor. Increase your fluid intake. °

## 2018-10-20 NOTE — ED Triage Notes (Signed)
Patient c/o headache, nausea, blurred vision, and sensitivity to light since 0400 today.

## 2018-10-20 NOTE — ED Notes (Signed)
Patient transported to CT 

## 2018-10-21 ENCOUNTER — Telehealth: Payer: Self-pay

## 2018-10-21 MED ORDER — ALPRAZOLAM 0.5 MG PO TABS: 0.5000 mg | ORAL_TABLET | Freq: Every evening | ORAL | 0 refills | Status: DC | PRN

## 2018-10-21 NOTE — Telephone Encounter (Signed)
Copied from [place] [id number]. Topic: Quick Communication - See Telephone Encounter >> Oct 21, 2018  9:59 AM Antonieta Iba C wrote: CRM for notification. See Telephone encounter for: 10/21/18.  Pt called in to request a refill on her ALPRAZolam (XANAX) 0.5 MG tablet. Pt says that she lost her job today and need medication to help cope.   [facility]: [facility] [address], [place] [phone] (Phone) [phone] (Fax)

## 2018-10-21 NOTE — Telephone Encounter (Signed)
Xanax refilled per request

## 2018-10-21 NOTE — Addendum Note (Signed)
Addended by: Jearld Fenton on: 10/21/2018 11:54 AM   Modules accepted: Orders

## 2018-10-24 ENCOUNTER — Other Ambulatory Visit: Payer: Self-pay | Admitting: Internal Medicine

## 2018-10-24 ENCOUNTER — Encounter: Payer: Self-pay | Admitting: Internal Medicine

## 2018-10-27 ENCOUNTER — Encounter: Payer: Self-pay | Admitting: Internal Medicine

## 2018-10-27 ENCOUNTER — Ambulatory Visit: Payer: BLUE CROSS/BLUE SHIELD | Admitting: Internal Medicine

## 2018-10-27 VITALS — BP 124/80 | HR 74 | Temp 98.3°F | Wt 220.0 lb

## 2018-10-27 DIAGNOSIS — F329 Major depressive disorder, single episode, unspecified: Secondary | ICD-10-CM

## 2018-10-27 DIAGNOSIS — R51 Headache: Secondary | ICD-10-CM

## 2018-10-27 DIAGNOSIS — F439 Reaction to severe stress, unspecified: Secondary | ICD-10-CM | POA: Diagnosis not present

## 2018-10-27 DIAGNOSIS — F419 Anxiety disorder, unspecified: Secondary | ICD-10-CM | POA: Diagnosis not present

## 2018-10-27 DIAGNOSIS — R519 Headache, unspecified: Secondary | ICD-10-CM

## 2018-10-27 DIAGNOSIS — F32A Depression, unspecified: Secondary | ICD-10-CM

## 2018-10-27 MED ORDER — TOPIRAMATE 25 MG PO CPSP: 25.0000 mg | ORAL_CAPSULE | Freq: Every day | ORAL | 2 refills | Status: DC

## 2018-10-27 MED ORDER — SUMATRIPTAN SUCCINATE 25 MG PO TABS: 25.0000 mg | ORAL_TABLET | ORAL | 2 refills | Status: DC | PRN

## 2018-10-27 NOTE — Patient Instructions (Signed)
Stress and Stress Management Stress is a normal reaction to life events. It is what you feel when life demands more than you are used to or more than you can handle. Some stress can be useful. For example, the stress reaction can help you catch the last bus of the day, study for a test, or meet a deadline at work. But stress that occurs too often or for too long can cause problems. It can affect your emotional health and interfere with relationships and normal daily activities. Too much stress can weaken your immune system and increase your risk for physical illness. If you already have a medical problem, stress can make it worse. What are the causes? All sorts of life events may cause stress. An event that causes stress for one person may not be stressful for another person. Major life events commonly cause stress. These may be positive or negative. Examples include losing your job, moving into a new home, getting married, having a baby, or losing a loved one. Less obvious life events may also cause stress, especially if they occur day after day or in combination. Examples include working long hours, driving in traffic, caring for children, being in debt, or being in a difficult relationship. What are the signs or symptoms? Stress may cause emotional symptoms including, the following:  Anxiety. This is feeling worried, afraid, on edge, overwhelmed, or out of control.  Anger. This is feeling irritated or impatient.  Depression. This is feeling sad, down, helpless, or guilty.  Difficulty focusing, remembering, or making decisions.  Stress may cause physical symptoms, including the following:  Aches and pains. These may affect your head, neck, back, stomach, or other areas of your body.  Tight muscles or clenched jaw.  Low energy or trouble sleeping.  Stress may cause unhealthy behaviors, including the following:  Eating to feel better (overeating) or skipping meals.  Sleeping too little,  too much, or both.  Working too much or putting off tasks (procrastination).  Smoking, drinking alcohol, or using drugs to feel better.  How is this diagnosed? Stress is diagnosed through an assessment by your health care provider. Your health care provider will ask questions about your symptoms and any stressful life events.Your health care provider will also ask about your medical history and may order blood tests or other tests. Certain medical conditions and medicine can cause physical symptoms similar to stress. Mental illness can cause emotional symptoms and unhealthy behaviors similar to stress. Your health care provider may refer you to a mental health professional for further evaluation. How is this treated? Stress management is the recommended treatment for stress.The goals of stress management are reducing stressful life events and coping with stress in healthy ways. Techniques for reducing stressful life events include the following:  Stress identification. Self-monitor for stress and identify what causes stress for you. These skills may help you to avoid some stressful events.  Time management. Set your priorities, keep a calendar of events, and learn to say "no." These tools can help you avoid making too many commitments.  Techniques for coping with stress include the following:  Rethinking the problem. Try to think realistically about stressful events rather than ignoring them or overreacting. Try to find the positives in a stressful situation rather than focusing on the negatives.  Exercise. Physical exercise can release both physical and emotional tension. The key is to find a form of exercise you enjoy and do it regularly.  Relaxation techniques. These relax the body and  mind. Examples include yoga, meditation, tai chi, biofeedback, deep breathing, progressive muscle relaxation, listening to music, being out in nature, journaling, and other hobbies. Again, the key is to find  one or more that you enjoy and can do regularly.  Healthy lifestyle. Eat a balanced diet, get plenty of sleep, and do not smoke. Avoid using alcohol or drugs to relax.  Strong support network. Spend time with family, friends, or other people you enjoy being around.Express your feelings and talk things over with someone you trust.  Counseling or talktherapy with a mental health professional may be helpful if you are having difficulty managing stress on your own. Medicine is typically not recommended for the treatment of stress.Talk to your health care provider if you think you need medicine for symptoms of stress. Follow these instructions at home:  Keep all follow-up visits as directed by your health care provider.  Take all medicines as directed by your health care provider. Contact a health care provider if:  Your symptoms get worse or you start having new symptoms.  You feel overwhelmed by your problems and can no longer manage them on your own. Get help right away if:  You feel like hurting yourself or someone else. This information is not intended to replace advice given to you by your health care provider. Make sure you discuss any questions you have with your health care provider. Document Released: 06/05/2001 Document Revised: 05/17/2016 Document Reviewed: 08/04/2013 Elsevier Interactive Patient Education  2017 [employer]  

## 2018-10-27 NOTE — Progress Notes (Signed)
Subjective:    Patient ID: [name], female    DOB: 04-16-1966, 52 y.o.   MRN: [id number]  HPI  Pt presents to the clinic today with c/o frequent headaches. She started noticing these a few months ago, occurring daily. They are located in the top of her head. She describes the pain as crushing. She reports associated nausea and sensitivity to light. She denies dizziness, visual changes, vomiting or sensitivity to sounds. She has had frequent headaches in the past but she reports these are more intense. She has tried Excedrin, NSAID's and muscle relaxers in the past with minimal relief. She does report increase stress, recently terminated from her job. She is taking Prozac and Xanax which seems to help. She stopped taking her Wellbutrin 2 months ago because she just couldn't remember to take it and she reports it didn't seem to help. She denies SI/HI.  She was recently seen 10/28 for the same in the ER. CT scan of the head was negative. She was treated with IV fluids, Compazine, Benadryl, Toradol and Decadron. She reports her headache improved but the Compazine made her loopy.  Review of Systems      Past Medical History:  Diagnosis Date  . Allergy   . Anxiety   . Depression   . GERD (gastroesophageal reflux disease)   . History of abnormal cervical Pap smear    1991 -- 1995--hx cryotherapy to cervix  . History of colon polyps    2008- BENIGN  . Hydronephrosis, left   . Kidney stones 2016  . PONV (postoperative nausea and vomiting)     Current Outpatient Medications  Medication Sig Dispense Refill  . acetaminophen (TYLENOL) 500 MG tablet Take 1,000 mg by mouth every 6 (six) hours as needed for mild pain, moderate pain, fever or headache.    . ALPRAZolam (XANAX) 0.5 MG tablet Take 1 tablet (0.5 mg total) by mouth at bedtime as needed for anxiety or sleep. 20 tablet 0  . buPROPion (WELLBUTRIN XL) 300 MG 24 hr tablet Take 1 tablet (300 mg total) by mouth daily. 90 tablet 0  .  FLUoxetine (PROZAC) 10 MG tablet Take 1 tablet (10 mg total) by mouth daily. 30 tablet 2  . HYDROcodone-acetaminophen (NORCO/VICODIN) 5-325 MG tablet Take 1 tablet by mouth every 8 (eight) hours as needed for moderate pain. (Patient not taking: Reported on 10/20/2018) 15 tablet 0  . methocarbamol (ROBAXIN) 500 MG tablet Take 1 tablet (500 mg total) by mouth at bedtime as needed for muscle spasms. (Patient not taking: Reported on 10/20/2018) 15 tablet 0  . Multiple Vitamin (MULTIVITAMIN WITH MINERALS) TABS tablet Take 1 tablet by mouth daily.    . tamsulosin (FLOMAX) 0.4 MG CAPS capsule Take 1 capsule (0.4 mg total) by mouth daily. (Patient not taking: Reported on 10/20/2018) 14 capsule 0   No current facility-administered medications for this visit.     Allergies  Allergen Reactions  . Desvenlafaxine Other (See Comments)    Reaction:  Withdrawal   . Iodinated Diagnostic Agents Rash    Previously mis-entered as Iohexol allergy. Patient is not allergic to Non-Ionic contrast currently.    Family History  Problem Relation Age of Onset  . Hypertension Mother   . Colon polyps Mother   . Diabetes Father   . Colon polyps Father   . Cancer Paternal Uncle        Pancreas  . Pancreatic cancer Paternal Uncle   . Heart attack Maternal Grandmother   . Drug  abuse Sister   . Esophageal cancer Neg Hx   . Stomach cancer Neg Hx   . Rectal cancer Neg Hx   . Colon cancer Neg Hx     Social History   Socioeconomic History  . Marital status: Married    Spouse name: Not on file  . Number of children: Not on file  . Years of education: Not on file  . Highest education level: Not on file  Occupational History  . Not on file  Social Needs  . Financial resource strain: Not on file  . Food insecurity:    Worry: Not on file    Inability: Not on file  . Transportation needs:    Medical: Not on file    Non-medical: Not on file  Tobacco Use  . Smoking status: Former Smoker    Years: 15.00     Types: Cigarettes    Last attempt to quit: 05/16/2008    Years since quitting: 10.4  . Smokeless tobacco: Never Used  Substance and Sexual Activity  . Alcohol use: No    Alcohol/week: 0.0 standard drinks  . Drug use: No  . Sexual activity: Yes    Partners: Male    Birth control/protection: Surgical    Comment: Hyst  Lifestyle  . Physical activity:    Days per week: Not on file    Minutes per session: Not on file  . Stress: Not on file  Relationships  . Social connections:    Talks on phone: Not on file    Gets together: Not on file    Attends religious service: Not on file    Active member of club or organization: Not on file    Attends meetings of clubs or organizations: Not on file    Relationship status: Not on file  . Intimate partner violence:    Fear of current or ex partner: Not on file    Emotionally abused: Not on file    Physically abused: Not on file    Forced sexual activity: Not on file  Other Topics Concern  . Not on file  Social History Narrative  . Not on file     Constitutional: Pt reports headaches. Denies fever, malaise, fatigue, or abrupt weight changes.  HEENT: Pt reports sensitivity to light. Denies eye pain, eye redness, ear pain, ringing in the ears, wax buildup, runny nose, nasal congestion, bloody nose, or sore throat. Respiratory: Denies difficulty breathing, shortness of breath, cough or sputum production.   Cardiovascular: Denies chest pain, chest tightness, palpitations or swelling in the hands or feet.  Gastrointestinal: Pt reports nausea. Denies abdominal pain, bloating, constipation, diarrhea or blood in the stool.  Neurological: Denies dizziness, difficulty with memory, difficulty with speech or problems with balance and coordination.  Psych: Pt reports stress, anxiety and depression. Denies SI/HI.  No other specific complaints in a complete review of systems (except as listed in HPI above).  Objective:   Physical Exam   BP 124/80    Pulse 74   Temp 98.3 F (36.8 C) (Oral)   Wt 220 lb (99.8 kg)   LMP 12/24/2006 (Approximate)   SpO2 97%   BMI 35.51 kg/m  Wt Readings from Last 3 Encounters:  10/27/18 220 lb (99.8 kg)  10/20/18 218 lb (98.9 kg)  08/05/18 210 lb (95.3 kg)    General: Appears her stated age, obese in NAD. HEENT: Head: normal shape and size; Eyes: sclera white, no icterus, conjunctiva pink, PERRLA and EOMs intact; Abdomen: Soft  and nontender. Normal bowel sounds. No distention or masses noted. Neurological: Alert and oriented. Coordination normal.  Psychiatric: She is anxious appearing.     BMET    Component Value Date/Time   NA 139 04/01/2018 0857   K 4.6 04/01/2018 0857   CL 104 04/01/2018 0857   CO2 30 04/01/2018 0857   GLUCOSE 98 04/01/2018 0857   BUN 15 04/01/2018 0857   CREATININE 0.82 04/01/2018 0857   CALCIUM 9.2 04/01/2018 0857   GFRNONAA >60 11/15/2017 2142   GFRAA >60 11/15/2017 2142    Lipid Panel     Component Value Date/Time   CHOL 145 04/01/2018 0857   TRIG 130.0 04/01/2018 0857   HDL 35.90 (L) 04/01/2018 0857   CHOLHDL 4 04/01/2018 0857   VLDL 26.0 04/01/2018 0857   LDLCALC 83 04/01/2018 0857    CBC    Component Value Date/Time   WBC 5.6 04/01/2018 0857   RBC 4.52 04/01/2018 0857   HGB 13.2 04/01/2018 0857   HCT 38.5 04/01/2018 0857   PLT 237.0 04/01/2018 0857   MCV 85.2 04/01/2018 0857   MCH 28.9 11/15/2017 2142   MCHC 34.2 04/01/2018 0857   RDW 13.5 04/01/2018 0857   LYMPHSABS 3.0 05/31/2017 2332   MONOABS 0.7 05/31/2017 2332   EOSABS 0.1 05/31/2017 2332   BASOSABS 0.0 05/31/2017 2332    Hgb A1C Lab Results  Component Value Date   HGBA1C 5.8 03/18/2017           Assessment & Plan:   ER Follow Up Frequent Headaches, Anxiety, Depression, Stress:  ER notes, labs and imaging reviewed Will trial Topamax 25 mg QHS Will give RX for Imitrex to take as needed for breakthrough Continue Prozac and Xanax Consider adding Buspar in 1 month, if no  improvement with anxiety Support offered today  Return precautions discussed Webb Silversmith, NP

## 2018-10-30 ENCOUNTER — Encounter: Payer: Self-pay | Admitting: Internal Medicine

## 2018-11-12 ENCOUNTER — Encounter: Payer: Self-pay | Admitting: Internal Medicine

## 2018-11-12 MED ORDER — TOPIRAMATE 25 MG PO CPSP: 25.0000 mg | ORAL_CAPSULE | Freq: Every day | ORAL | 1 refills | Status: DC

## 2018-11-12 MED ORDER — BUSPIRONE HCL 5 MG PO TABS: 5.0000 mg | ORAL_TABLET | Freq: Two times a day (BID) | ORAL | 1 refills | Status: DC

## 2018-11-12 MED ORDER — FLUOXETINE HCL 10 MG PO TABS: 10.0000 mg | ORAL_TABLET | Freq: Every day | ORAL | 1 refills | Status: DC

## 2018-11-12 MED ORDER — ALPRAZOLAM 0.5 MG PO TABS: 0.5000 mg | ORAL_TABLET | Freq: Every evening | ORAL | 1 refills | Status: DC | PRN

## 2018-11-12 MED ORDER — SUMATRIPTAN SUCCINATE 25 MG PO TABS: 25.0000 mg | ORAL_TABLET | ORAL | 1 refills | Status: DC | PRN

## 2018-11-20 IMAGING — CR DG CHEST 2V
2 series · 2 of 2 positions shown · non-contrast
Comparison: 04/26/2017

CLINICAL DATA: Pneumonia with worsening shortness of breath and
weakness

EXAM:
CHEST  2 VIEW

[w chest pa]
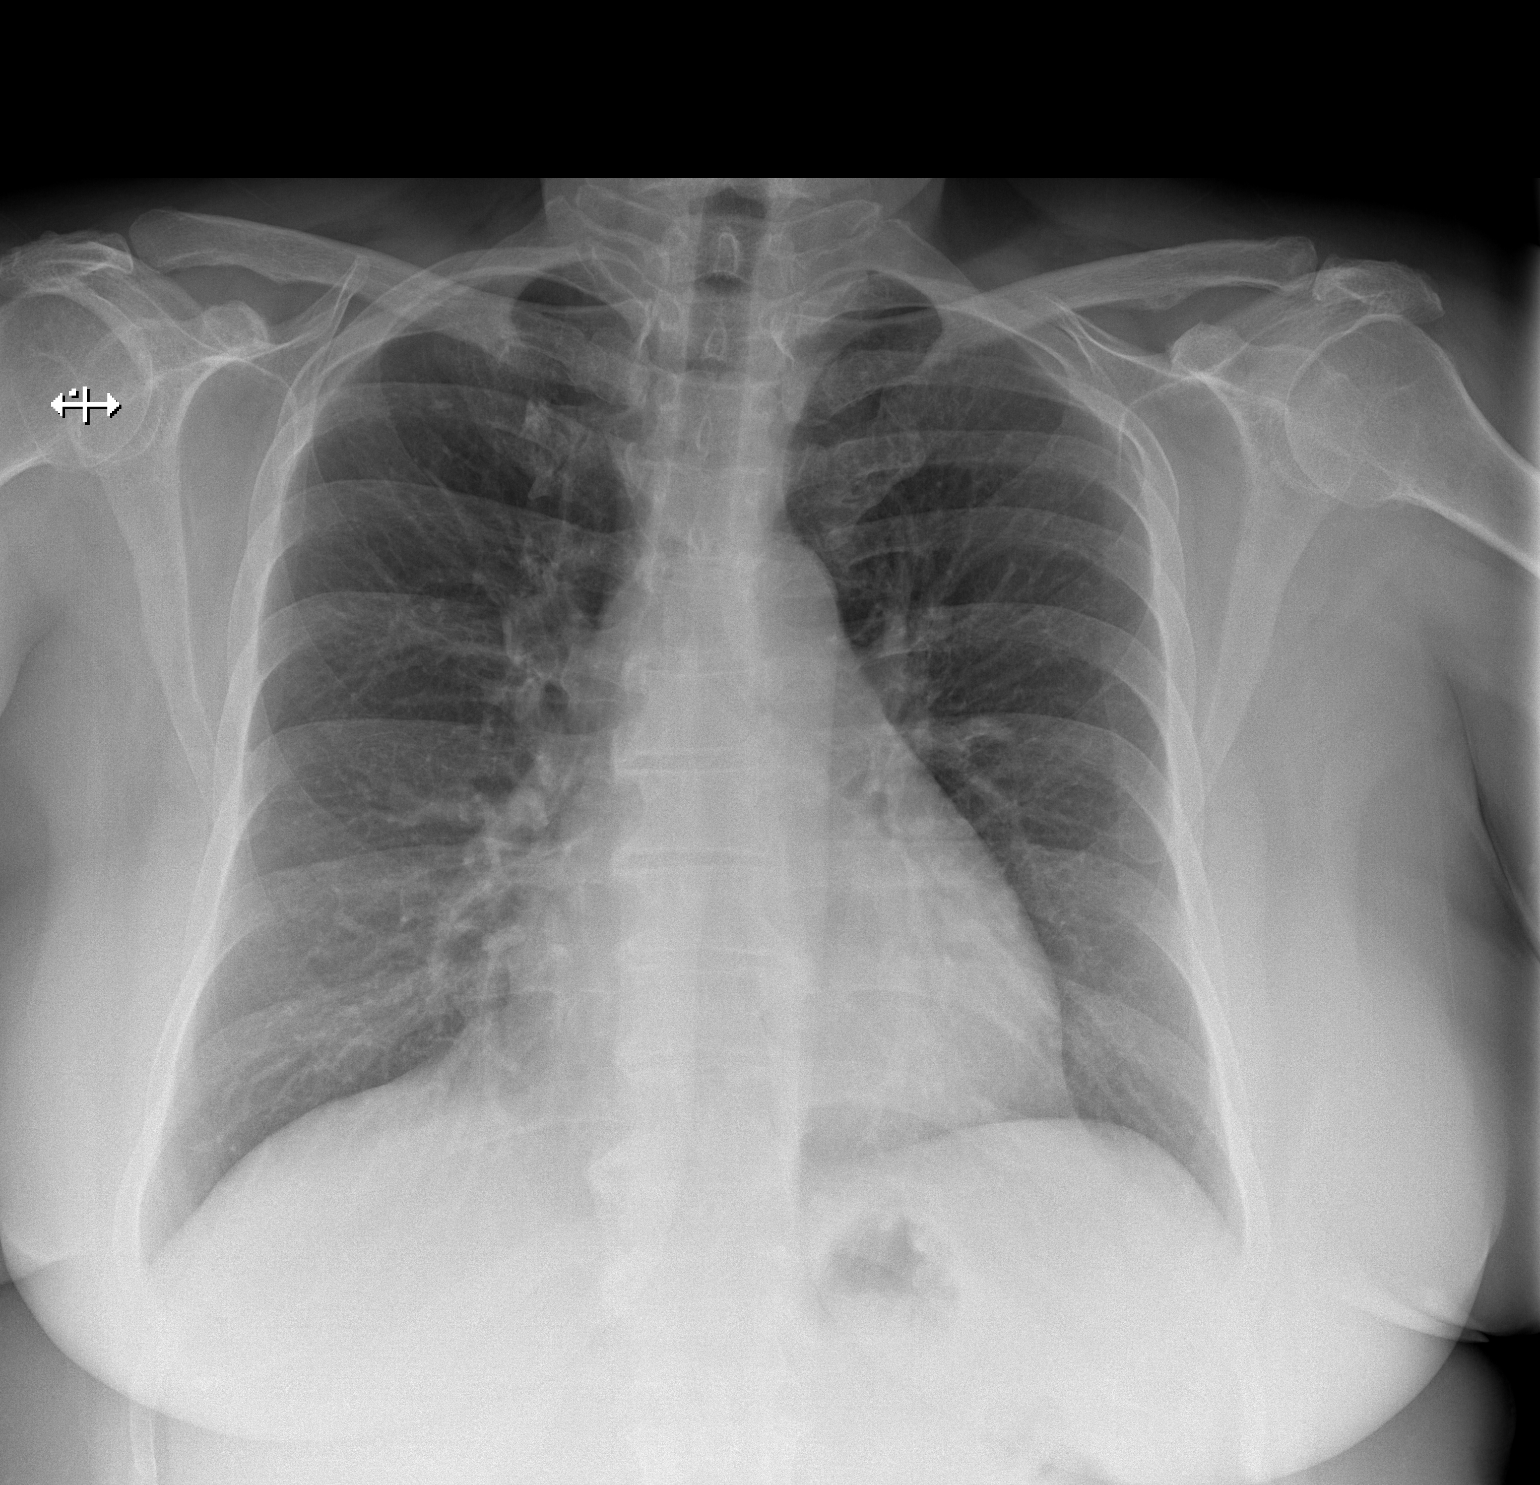

[w chest lat]
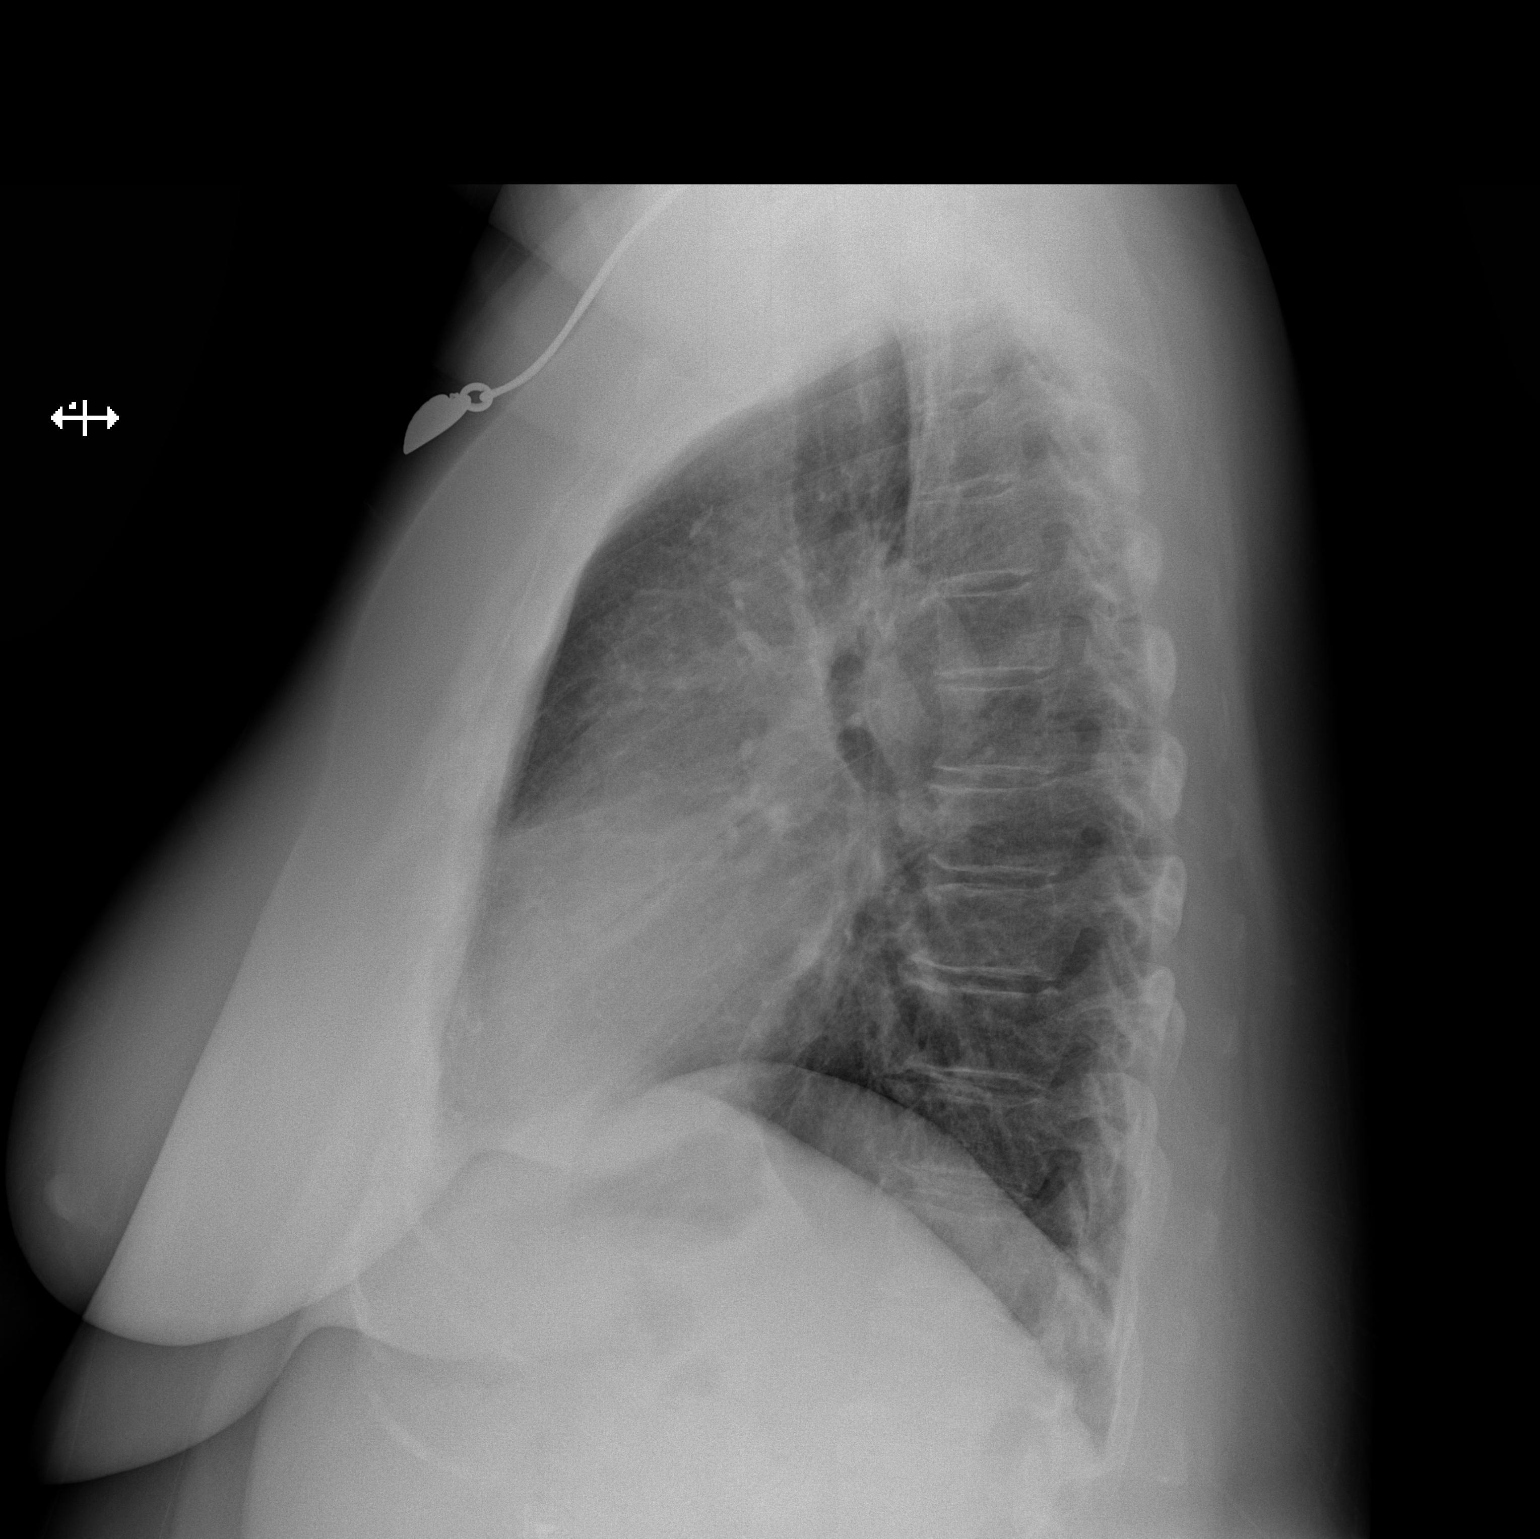

[2 of 2 positions shown; findings below may reference images not displayed]

FINDINGS: No acute infiltrate or effusion. Heart size upper limits of normal.
No pneumothorax.
IMPRESSION: No acute infiltrate or edema.

## 2019-03-04 ENCOUNTER — Encounter: Payer: Self-pay | Admitting: Internal Medicine

## 2019-03-04 ENCOUNTER — Ambulatory Visit (INDEPENDENT_AMBULATORY_CARE_PROVIDER_SITE_OTHER): Payer: 59 | Admitting: Internal Medicine

## 2019-03-04 ENCOUNTER — Other Ambulatory Visit: Payer: Self-pay

## 2019-03-04 VITALS — BP 128/84 | HR 102 | Temp 97.8°F | Wt 231.0 lb

## 2019-03-04 DIAGNOSIS — N200 Calculus of kidney: Secondary | ICD-10-CM | POA: Diagnosis not present

## 2019-03-04 DIAGNOSIS — R109 Unspecified abdominal pain: Secondary | ICD-10-CM

## 2019-03-04 DIAGNOSIS — R1032 Left lower quadrant pain: Secondary | ICD-10-CM

## 2019-03-04 LAB — POC URINALSYSI DIPSTICK (AUTOMATED)
Bilirubin, UA: NEGATIVE
Glucose, UA: NEGATIVE
Ketones, UA: NEGATIVE
Leukocytes, UA: NEGATIVE
Nitrite, UA: NEGATIVE
Protein, UA: NEGATIVE
Spec Grav, UA: 1.015 (ref 1.010–1.025)
Urobilinogen, UA: 0.2 E.U./dL
pH, UA: 7 (ref 5.0–8.0)

## 2019-03-04 MED ORDER — TAMSULOSIN HCL 0.4 MG PO CAPS: 0.4000 mg | ORAL_CAPSULE | Freq: Every day | ORAL | 3 refills | Status: DC

## 2019-03-04 MED ORDER — HYDROCODONE-ACETAMINOPHEN 5-325 MG PO TABS: 1.0000 | ORAL_TABLET | Freq: Four times a day (QID) | ORAL | 0 refills | Status: DC | PRN

## 2019-03-04 NOTE — Addendum Note (Signed)
Addended by: Lurlean Nanny on: 03/04/2019 04:57 PM   Modules accepted: Orders

## 2019-03-04 NOTE — Progress Notes (Signed)
Subjective:    Patient ID: [name], female    DOB: Aug 03, 1966, 53 y.o.   MRN: [id number]  HPI  Pt presents to the clinic today with c/o left flank pain. She reports this started 2-3 days ago. She describes the pain as achy, pressure. The pain radiates in the left side of her abdomen. The abdomen is tender to touch. She denies nausea, vomiting, fever or body aches but has had some chills. She denies urgency, frequency, dysuria or blood in her urine. She denies vaginal complaints. She has taken leftover Hydrocodone with some relief.  Review of Systems      Past Medical History:  Diagnosis Date  . Allergy   . Anxiety   . Depression   . GERD (gastroesophageal reflux disease)   . History of abnormal cervical Pap smear    1991 -- 1995--hx cryotherapy to cervix  . History of colon polyps    2008- BENIGN  . Hydronephrosis, left   . Kidney stones 2016  . PONV (postoperative nausea and vomiting)     Current Outpatient Medications  Medication Sig Dispense Refill  . acetaminophen (TYLENOL) 500 MG tablet Take 1,000 mg by mouth every 6 (six) hours as needed for mild pain, moderate pain, fever or headache.    . ALPRAZolam (XANAX) 0.5 MG tablet Take 1 tablet (0.5 mg total) by mouth at bedtime as needed for anxiety or sleep. 20 tablet 1  . busPIRone (BUSPAR) 5 MG tablet Take 1 tablet (5 mg total) by mouth 2 (two) times daily. 180 tablet 1  . FLUoxetine (PROZAC) 10 MG tablet Take 1 tablet (10 mg total) by mouth daily. 90 tablet 1  . Multiple Vitamin (MULTIVITAMIN WITH MINERALS) TABS tablet Take 1 tablet by mouth daily.    . SUMAtriptan (IMITREX) 25 MG tablet Take 1 tablet (25 mg total) by mouth every 2 (two) hours as needed for migraine. May repeat in 2 hours if headache persists or recurs. 30 tablet 1  . topiramate (TOPAMAX) 25 MG capsule Take 1 capsule (25 mg total) by mouth daily. 90 capsule 1  . HYDROcodone-acetaminophen (NORCO/VICODIN) 5-325 MG tablet Take 1 tablet by mouth every 6  (six) hours as needed for moderate pain. 20 tablet 0  . tamsulosin (FLOMAX) 0.4 MG CAPS capsule Take 1 capsule (0.4 mg total) by mouth daily. 30 capsule 3   No current facility-administered medications for this visit.     Allergies  Allergen Reactions  . Compazine [Prochlorperazine]     Altered mental status  . Desvenlafaxine Other (See Comments)    Reaction:  Withdrawal   . Iodinated Diagnostic Agents Rash    Previously mis-entered as Iohexol allergy. Patient is not allergic to Non-Ionic contrast currently.    Family History  Problem Relation Age of Onset  . Hypertension Mother   . Colon polyps Mother   . Diabetes Father   . Colon polyps Father   . Cancer Paternal Uncle        Pancreas  . Pancreatic cancer Paternal Uncle   . Heart attack Maternal Grandmother   . Drug abuse Sister   . Esophageal cancer Neg Hx   . Stomach cancer Neg Hx   . Rectal cancer Neg Hx   . Colon cancer Neg Hx     Social History   Socioeconomic History  . Marital status: Married    Spouse name: Not on file  . Number of children: Not on file  . Years of education: Not on file  .  Highest education level: Not on file  Occupational History  . Not on file  Social Needs  . Financial resource strain: Not on file  . Food insecurity:    Worry: Not on file    Inability: Not on file  . Transportation needs:    Medical: Not on file    Non-medical: Not on file  Tobacco Use  . Smoking status: Former Smoker    Years: 15.00    Types: Cigarettes    Last attempt to quit: 05/16/2008    Years since quitting: 10.8  . Smokeless tobacco: Never Used  Substance and Sexual Activity  . Alcohol use: No    Alcohol/week: 0.0 standard drinks  . Drug use: No  . Sexual activity: Yes    Partners: Male    Birth control/protection: Surgical    Comment: Hyst  Lifestyle  . Physical activity:    Days per week: Not on file    Minutes per session: Not on file  . Stress: Not on file  Relationships  . Social  connections:    Talks on phone: Not on file    Gets together: Not on file    Attends religious service: Not on file    Active member of club or organization: Not on file    Attends meetings of clubs or organizations: Not on file    Relationship status: Not on file  . Intimate partner violence:    Fear of current or ex partner: Not on file    Emotionally abused: Not on file    Physically abused: Not on file    Forced sexual activity: Not on file  Other Topics Concern  . Not on file  Social History Narrative  . Not on file     Constitutional: Denies fever, malaise, fatigue, headache or abrupt weight changes.  Respiratory: Denies difficulty breathing, shortness of breath, cough or sputum production.   Cardiovascular: Denies chest pain, chest tightness, palpitations or swelling in the hands or feet.  Gastrointestinal: Denies abdominal pain, bloating, constipation, diarrhea or blood in the stool.  GU: Pt reports left flank pain, left side abdominal pain. Denies urgency, frequency, pain with urination, burning sensation, blood in urine, odor or discharge.  No other specific complaints in a complete review of systems (except as listed in HPI above).  Objective:   Physical Exam   BP 128/84   Pulse (!) 102   Temp 97.8 F (36.6 C) (Oral)   Wt 231 lb (104.8 kg)   LMP 12/24/2006 (Approximate)   SpO2 98%   BMI 37.28 kg/m  Wt Readings from Last 3 Encounters:  03/04/19 231 lb (104.8 kg)  10/27/18 220 lb (99.8 kg)  10/20/18 218 lb (98.9 kg)    General: Appears her stated age, obese, in NAD. Cardiovascular: Tachycardic with normal rhythm. S1,S2 noted.  No murmur, rubs or gallops noted. No JVD or BLE edema. No carotid bruits noted. Pulmonary/Chest: Normal effort and positive vesicular breath sounds. No respiratory distress. No wheezes, rales or ronchi noted.  Abdomen: Soft and tender in the LLQ. Positive CVA tenderness noted on the left.  Neurological: Alert and oriented.    BMET     Component Value Date/Time   NA 139 04/01/2018 0857   K 4.6 04/01/2018 0857   CL 104 04/01/2018 0857   CO2 30 04/01/2018 0857   GLUCOSE 98 04/01/2018 0857   BUN 15 04/01/2018 0857   CREATININE 0.82 04/01/2018 0857   CALCIUM 9.2 04/01/2018 0857   GFRNONAA >60 11/15/2017  2142   GFRAA >60 11/15/2017 2142    Lipid Panel     Component Value Date/Time   CHOL 145 04/01/2018 0857   TRIG 130.0 04/01/2018 0857   HDL 35.90 (L) 04/01/2018 0857   CHOLHDL 4 04/01/2018 0857   VLDL 26.0 04/01/2018 0857   LDLCALC 83 04/01/2018 0857    CBC    Component Value Date/Time   WBC 5.6 04/01/2018 0857   RBC 4.52 04/01/2018 0857   HGB 13.2 04/01/2018 0857   HCT 38.5 04/01/2018 0857   PLT 237.0 04/01/2018 0857   MCV 85.2 04/01/2018 0857   MCH 28.9 11/15/2017 2142   MCHC 34.2 04/01/2018 0857   RDW 13.5 04/01/2018 0857   LYMPHSABS 3.0 05/31/2017 2332   MONOABS 0.7 05/31/2017 2332   EOSABS 0.1 05/31/2017 2332   BASOSABS 0.0 05/31/2017 2332    Hgb A1C Lab Results  Component Value Date   HGBA1C 5.8 03/18/2017           Assessment & Plan:   Left Flank Pain, Left Side Abdominal Pain:  Concerning for kidney stone which she has history of Urinalysis 1+ blood Will send urine culture RX for Flomax 0.4 mg daily until stone passes RX for Norco 5-325 mg Q6H prn for pain ER precautions discussed Work note provided  Return precautions discussed Webb Silversmith, NP

## 2019-03-04 NOTE — Patient Instructions (Signed)

## 2019-03-05 LAB — URINE CULTURE
MICRO NUMBER:: 305915
SPECIMEN QUALITY:: ADEQUATE

## 2019-03-06 ENCOUNTER — Encounter: Payer: Self-pay | Admitting: Internal Medicine

## 2019-03-06 DIAGNOSIS — R1032 Left lower quadrant pain: Secondary | ICD-10-CM

## 2019-03-10 ENCOUNTER — Other Ambulatory Visit: Payer: Self-pay

## 2019-03-10 ENCOUNTER — Ambulatory Visit (INDEPENDENT_AMBULATORY_CARE_PROVIDER_SITE_OTHER)
Admission: RE | Admit: 2019-03-10 | Discharge: 2019-03-10 | Disposition: A | Discharge status: Home / Self Care | Payer: 59 | Source: Ambulatory Visit | Attending: Internal Medicine | Admitting: Internal Medicine

## 2019-03-10 DIAGNOSIS — R319 Hematuria, unspecified: Secondary | ICD-10-CM | POA: Diagnosis not present

## 2019-03-10 DIAGNOSIS — R1032 Left lower quadrant pain: Secondary | ICD-10-CM | POA: Diagnosis not present

## 2019-05-17 ENCOUNTER — Emergency Department (HOSPITAL_COMMUNITY): Payer: 59

## 2019-05-17 ENCOUNTER — Emergency Department (HOSPITAL_COMMUNITY)
Admission: EM | Admit: 2019-05-17 | Discharge: 2019-05-18 | Disposition: A | Discharge status: Home / Self Care | Payer: 59 | Attending: Emergency Medicine | Admitting: Emergency Medicine

## 2019-05-17 ENCOUNTER — Other Ambulatory Visit: Payer: Self-pay

## 2019-05-17 ENCOUNTER — Encounter (HOSPITAL_COMMUNITY): Payer: Self-pay

## 2019-05-17 DIAGNOSIS — Y9289 Other specified places as the place of occurrence of the external cause: Secondary | ICD-10-CM | POA: Insufficient documentation

## 2019-05-17 DIAGNOSIS — Z79899 Other long term (current) drug therapy: Secondary | ICD-10-CM | POA: Insufficient documentation

## 2019-05-17 DIAGNOSIS — M25551 Pain in right hip: Secondary | ICD-10-CM | POA: Insufficient documentation

## 2019-05-17 DIAGNOSIS — Y998 Other external cause status: Secondary | ICD-10-CM | POA: Diagnosis not present

## 2019-05-17 DIAGNOSIS — Z87891 Personal history of nicotine dependence: Secondary | ICD-10-CM | POA: Diagnosis not present

## 2019-05-17 DIAGNOSIS — M549 Dorsalgia, unspecified: Secondary | ICD-10-CM | POA: Insufficient documentation

## 2019-05-17 DIAGNOSIS — W108XXA Fall (on) (from) other stairs and steps, initial encounter: Secondary | ICD-10-CM | POA: Insufficient documentation

## 2019-05-17 DIAGNOSIS — Y9301 Activity, walking, marching and hiking: Secondary | ICD-10-CM | POA: Insufficient documentation

## 2019-05-17 MED ORDER — MORPHINE SULFATE (PF) 4 MG/ML IV SOLN
4.0000 mg | Freq: Once | INTRAVENOUS | Status: AC
  Administered 2019-05-17: 23:00:00 4 mg via INTRAVENOUS
  Filled 2019-05-17: qty 1

## 2019-05-17 MED ORDER — ONDANSETRON HCL 4 MG/2ML IJ SOLN
4.0000 mg | Freq: Once | INTRAMUSCULAR | Status: AC
  Administered 2019-05-17: 4 mg via INTRAVENOUS
  Filled 2019-05-17: qty 2

## 2019-05-17 MED ORDER — FENTANYL CITRATE (PF) 100 MCG/2ML IJ SOLN
50.0000 ug | Freq: Once | INTRAMUSCULAR | Status: AC
  Administered 2019-05-18: 50 ug via INTRAVENOUS
  Filled 2019-05-17: qty 2

## 2019-05-17 MED ORDER — FENTANYL CITRATE (PF) 100 MCG/2ML IJ SOLN
100.0000 ug | Freq: Once | INTRAMUSCULAR | Status: AC
  Administered 2019-05-17: 22:00:00 100 ug via INTRAVENOUS
  Filled 2019-05-17: qty 2

## 2019-05-17 NOTE — ED Provider Notes (Signed)
[facility] DEPT Provider Note   CSN: [id number] Arrival date & time: 05/17/19  2107    History   Chief Complaint Chief Complaint  Patient presents with  . Hip Pain    L    HPI [name] is a 53 y.o. female past medical history of depression, GERD, hydronephrosis who presents for evaluation after mechanical fall that occurred earlier today.  Patient reports that she was walking down her living room steps and states that she slipped and fell on the steps.  She states that then she slid down about 7 steps.  She states she did not hit her head or lose consciousness.  Patient states that she landed on her right hip.  She states that she was assisted to the vehicle by her sons and states that she was not able to bear weight or ambulate.  Patient reports pain is worse with movement.  Patient states she is not on blood thinners.  She denies any preceding chest pain or dizziness.  Patient denies any abdominal pain, nausea/vomiting, chest pain, difficulty breathing, dizziness, neck pain, numbness/weakness of arms or legs, saddle anesthesia, urinary or bowel incontinence.     The history is provided by the patient.    Past Medical History:  Diagnosis Date  . Allergy   . Anxiety   . Depression   . GERD (gastroesophageal reflux disease)   . History of abnormal cervical Pap smear    1991 -- 1995--hx cryotherapy to cervix  . History of colon polyps    2008- BENIGN  . Hydronephrosis, left   . Kidney stones 2016  . PONV (postoperative nausea and vomiting)     Patient Active Problem List   Diagnosis Date Noted  . HLD (hyperlipidemia) 04/01/2018  . GERD (gastroesophageal reflux disease) 04/01/2018  . Anxiety and depression 11/01/2014    Past Surgical History:  Procedure Laterality Date  . ABDOMINAL HYSTERECTOMY    . ANTERIOR AND POSTERIOR REPAIR WITH SACROSPINOUS FIXATION N/A 08/16/2015   Procedure: ANTERIOR COLPORRHAPHY WITH XENOFORM GRAFT AND  SACROSPINOUS FIXATION;  Surgeon: Nunzio Cobbs, MD;  Location: [facility];  Service: Gynecology;  Laterality: N/A;  2.5 hours OR time  . BLADDER SUSPENSION N/A 08/16/2015   Procedure: TRANSVAGINAL TAPE (TVT) PROCEDURE exact midurethral sling;  Surgeon: Nunzio Cobbs, MD;  Location: [facility];  Service: Gynecology;  Laterality: N/A;  . COLONOSCOPY    . COLONOSCOPY W/ POLYPECTOMY  05-30-2007  . CYSTOSCOPY N/A 08/16/2015   Procedure: CYSTOSCOPY;  Surgeon: Nunzio Cobbs, MD;  Location: [facility];  Service: Gynecology;  Laterality: N/A;  . CYSTOSCOPY W/ RETROGRADES Bilateral 06/22/2015   Procedure: CYSTOSCOPY WITH RETROGRADE PYELOGRAM;  Surgeon: Cleon Gustin, MD;  Location: [facility];  Service: Urology;  Laterality: Bilateral;  . CYSTOSCOPY W/ URETERAL STENT PLACEMENT  02/  2000  . CYSTOSCOPY WITH HOLMIUM LASER LITHOTRIPSY Left 05/18/2015   Procedure: CYSTOSCOPY WITH HOLMIUM LASER LITHOTRIPSY;  Surgeon: Alexis Frock, MD;  Location: [facility];  Service: Urology;  Laterality: Left;  . CYSTOSCOPY WITH RETROGRADE PYELOGRAM, URETEROSCOPY AND STENT PLACEMENT Left 06/22/2015   Procedure: CYSTOSCOPY,  LEFT URETEROSCOPY;  Surgeon: Cleon Gustin, MD;  Location: [facility];  Service: Urology;  Laterality: Left;  . CYSTOSCOPY WITH URETEROSCOPY AND STENT PLACEMENT Left 05/18/2015   Procedure: CYSTOSCOPY WITH URETEROSCOPY, STONE MANIPULATION AND STENT PLACEMENT;  Surgeon: Alexis Frock, MD;  Location: [facility];  Service: Urology;  Laterality: Left;  . DIAGNOSTIC LAPAROSCOPY    . DX LAPAROSCOPY  X2  . ESOPHAGOGASTRODUODENOSCOPY  05-09-2007  . EXPLORATORY LAPAROTOMY W/ BILATERAL SALPINGECTOMY AND REMOVAL RIGHT ECTOPIC PREG.  02-23-2004  . EXTRACORPOREAL SHOCK WAVE LITHOTRIPSY  2001  &  2002  . LAPAROSCOPIC CHOLECYSTECTOMY  1999  . POLYPECTOMY    . SHOULDER ARTHROSCOPY WITH OPEN ROTATOR CUFF REPAIR Right 2012  .  TOTAL ABDOMINAL HYSTERECTOMY W/ BILATERAL OOPHORECTOMY AND LYSIS ADHESIONS  09-02-2007  . TUBAL LIGATION Bilateral 1995  . UMBILICAL HERNIA REPAIR  2003  . VAGINAL HYSTERECTOMY N/A 08/18/2015   Procedure: Exam under Anesthesia, Excision Xenform Graft, Removal Bilateral Sacrospinous Ligament sutures;  Surgeon: Nunzio Cobbs, MD;  Location: [facility];  Service: Gynecology;  Laterality: N/A;     OB History    Gravida  3   Para  2   Term  2   Preterm      AB  1   Living  2     SAB      TAB      Ectopic  1   Multiple      Live Births               Home Medications    Prior to Admission medications   Medication Sig Start Date End Date Taking? Authorizing Provider  acetaminophen (TYLENOL) 500 MG tablet Take 1,000 mg by mouth every 6 (six) hours as needed for mild pain, moderate pain, fever or headache.    [provider]  ALPRAZolam Duanne Moron) 0.5 MG tablet Take 1 tablet (0.5 mg total) by mouth at bedtime as needed for anxiety or sleep. 11/12/18   Jearld Fenton, NP  busPIRone (BUSPAR) 5 MG tablet Take 1 tablet (5 mg total) by mouth 2 (two) times daily. 11/12/18   Jearld Fenton, NP  FLUoxetine (PROZAC) 10 MG tablet Take 1 tablet (10 mg total) by mouth daily. 11/12/18   Jearld Fenton, NP  HYDROcodone-acetaminophen (NORCO/VICODIN) 5-325 MG tablet Take 1 tablet by mouth every 8 (eight) hours. 05/18/19   Providence Lanius A, PA-C  lidocaine (LIDODERM) 5 % Place 1 patch onto the skin daily. Remove & Discard patch within 12 hours or as directed by MD 05/18/19   Volanda Napoleon, PA-C  Multiple Vitamin (MULTIVITAMIN WITH MINERALS) TABS tablet Take 1 tablet by mouth daily.    [provider]  SUMAtriptan (IMITREX) 25 MG tablet Take 1 tablet (25 mg total) by mouth every 2 (two) hours as needed for migraine. May repeat in 2 hours if headache persists or recurs. 11/12/18   Jearld Fenton, NP  tamsulosin (FLOMAX) 0.4 MG CAPS capsule Take 1 capsule (0.4 mg total)  by mouth daily. 03/04/19   Jearld Fenton, NP  topiramate (TOPAMAX) 25 MG capsule Take 1 capsule (25 mg total) by mouth daily. 11/12/18   Jearld Fenton, NP    Family History Family History  Problem Relation Age of Onset  . Hypertension Mother   . Colon polyps Mother   . Diabetes Father   . Colon polyps Father   . Cancer Paternal Uncle        Pancreas  . Pancreatic cancer Paternal Uncle   . Heart attack Maternal Grandmother   . Drug abuse Sister   . Esophageal cancer Neg Hx   . Stomach cancer Neg Hx   . Rectal cancer Neg Hx   . Colon cancer Neg Hx     Social History Social  History   Tobacco Use  . Smoking status: Former Smoker    Years: 15.00    Types: Cigarettes    Last attempt to quit: 05/16/2008    Years since quitting: 11.0  . Smokeless tobacco: Never Used  Substance Use Topics  . Alcohol use: No    Alcohol/week: 0.0 standard drinks  . Drug use: No     Allergies   Compazine [prochlorperazine]; Desvenlafaxine; and Iodinated diagnostic agents   Review of Systems Review of Systems  Eyes: Negative for visual disturbance.  Respiratory: Negative for shortness of breath.   Cardiovascular: Negative for chest pain.  Gastrointestinal: Negative for abdominal pain, nausea and vomiting.  Genitourinary: Negative for dysuria and hematuria.  Musculoskeletal: Positive for back pain. Negative for neck pain.       Hip pain  Neurological: Negative for weakness and numbness.  All other systems reviewed and are negative.    Physical Exam Updated Vital Signs BP 134/66   Pulse 74   Temp (!) 97.4 F (36.3 C) (Oral)   Resp 18   Ht 5\' 6"  (1.676 m)   Wt 108.9 kg   LMP 12/24/2006 (Approximate)   SpO2 91%   BMI 38.74 kg/m   Physical Exam Vitals signs and nursing note reviewed.  Constitutional:      Appearance: Normal appearance. She is well-developed.  HENT:     Head: Normocephalic and atraumatic.     Comments: No tenderness to palpation of skull. No deformities or  crepitus noted. No open wounds, abrasions or lacerations.  Eyes:     General: Lids are normal.     Conjunctiva/sclera: Conjunctivae normal.     Pupils: Pupils are equal, round, and reactive to light.     Comments: PERRL. EOMs intact.   Neck:     Musculoskeletal: Full passive range of motion without pain.     Comments: Full flexion/extension and lateral movement of neck fully intact. No bony midline tenderness. No deformities or crepitus.  Cardiovascular:     Rate and Rhythm: Normal rate and regular rhythm.     Pulses: Normal pulses.          Radial pulses are 2+ on the right side and 2+ on the left side.       Dorsalis pedis pulses are 2+ on the right side and 2+ on the left side.     Heart sounds: Normal heart sounds. No murmur. No friction rub. No gallop.   Pulmonary:     Effort: Pulmonary effort is normal.     Breath sounds: Normal breath sounds.  Abdominal:     Palpations: Abdomen is soft. Abdomen is not rigid.     Tenderness: There is no abdominal tenderness. There is no guarding.  Musculoskeletal: Normal range of motion.     Thoracic back: She exhibits no tenderness.       Back:     Comments: No tenderness palpation noted midline T-spine.  No deformity or crepitus noted.  Diffuse tenderness palpation noted lower lumbar region that extends over the sacral and gluteal muscle into the right hip.  No deformity or crepitus noted.  Tenderness palpation noted to the lateral aspect of the right hip.  No deformity or crepitus noted.  Limited range of motion secondary to pain.  No bony tenderness noted to right femur, right knee, right tib-fib, right ankle.  No tenderness palpation of the left lower extremity.  Full range of motion of left lower extremity without any difficulty.  Skin:  General: Skin is warm and dry.     Capillary Refill: Capillary refill takes less than 2 seconds.     Comments: Good distal cap refill. RLE is not dusky in appearance or cool to touch.  Neurological:      Mental Status: She is alert and oriented to person, place, and time.     Comments: 5/5 strength of BUE and LLE. Limited ROM of RLE secondary to pain but no weakness noted.  Sensation intact along major nerve distributions  Psychiatric:        Speech: Speech normal.      ED Treatments / Results  Labs (all labs ordered are listed, but only abnormal results are displayed) Labs Reviewed - No data to display  EKG None  Radiology Dg Lumbar Spine Complete  Result Date: 05/17/2019 CLINICAL DATA:  Pain status post fall EXAM: LUMBAR SPINE - COMPLETE 4+ VIEW COMPARISON:  CT dated 03/10/2019 FINDINGS: There is no acute displaced fracture. No dislocation. There are degenerative changes throughout the lumbar spine, greatest at the lower lumbar levels were there is severe disc height loss. There is some mild superior endplate sclerosis of the L5 vertebral body which appears new since prior CT, but is likely artifactual. Appearance of the sacrum on the lateral view is likely secondary to overlapping osseous structures. IMPRESSION: 1. No definite acute displaced fracture or dislocation. 2. Irregularity of the distal sacrum and coccyx on the lateral view may be projectional. Correlation with point tenderness is recommended as an underlying nondisplaced fracture is difficult to exclude. Electronically Signed   By: Constance Holster [name]   On: 05/17/2019 22:26   Ct Pelvis Wo Contrast  Result Date: 05/18/2019 CLINICAL DATA:  Hip fracture suspected EXAM: CT PELVIS WITHOUT CONTRAST TECHNIQUE: Multidetector CT imaging of the pelvis was performed following the standard protocol without intravenous contrast. COMPARISON:  03/09/2016 FINDINGS: Urinary Tract:  No abnormality visualized. Bowel: There is sigmoid diverticulosis without CT evidence of diverticulitis. There is a normal appendix in the right lower quadrant. Vascular/Lymphatic: No pathologically enlarged lymph nodes. No significant vascular abnormality seen.  Reproductive: No mass or other significant abnormality. The patient is status post prior hysterectomy. Other:  There is a small fat containing periumbilical hernia. Musculoskeletal: There is no acute displaced fracture or dislocation. The sacrum is unremarkable. There are degenerative changes at the L5-S1 level. IMPRESSION: 1. No acute displaced fracture or dislocation. If there is persistent high clinical suspicion for an occult fracture follow-up with MRI is recommended. 2. Diverticulosis without CT evidence of diverticulitis. 3. Normal appendix in the right lower quadrant. Electronically Signed   By: Constance Holster [name]   On: 05/18/2019 00:04   Dg Hip Unilat W Or Wo Pelvis 2-3 Views Right  Result Date: 05/17/2019 CLINICAL DATA:  Pain status post fall EXAM: DG HIP (WITH OR WITHOUT PELVIS) 2-3V RIGHT COMPARISON:  None. FINDINGS: There is no acute displaced fracture or dislocation. There are mild degenerative changes of both hips. The osseous mineralization is within normal limits. IMPRESSION: No acute displaced fracture.  No dislocation. Electronically Signed   By: Constance Holster [name]   On: 05/17/2019 22:29    Procedures Procedures (including critical care time)  Medications Ordered in ED Medications  fentaNYL (SUBLIMAZE) injection 100 mcg (100 mcg Intravenous Given 05/17/19 2141)  ondansetron (ZOFRAN) injection 4 mg (4 mg Intravenous Given 05/17/19 2141)  morphine 4 MG/ML injection 4 mg (4 mg Intravenous Given 05/17/19 2242)  fentaNYL (SUBLIMAZE) injection 50 mcg (50 mcg Intravenous Given 05/18/19  0012)  HYDROcodone-acetaminophen (NORCO/VICODIN) 5-325 MG per tablet 1 tablet (1 tablet Oral Given 05/18/19 0149)     Initial Impression / Assessment and Plan / ED Course  I have reviewed the triage vital signs and the nursing notes.  Pertinent labs & imaging results that were available during my care of the patient were reviewed by me and considered in my medical decision making (see chart for  details).        53 y.o. F who presents for evaluation of RLE after a mechanical fall. Patient is afebrile, non-toxic appearing, sitting comfortably on examination table. Vital signs reviewed and stable. Patient is neurovascularly intact. Patient with pain to the right hip and lower lumbar region. No head injury, no neck pain. Given reassuring physical exam and per [facility] CT criteria, no imaging is indicated at this time. Plan for pain medications, XRs.  XRs reviewed. No acute bony abnormality of the hip. There is questionable irregularity of the sacrum seen.   Attempted to walk patient in the room. She was having pain getting out of the bed and was not able to put weight on her RLE. Given concerns of continued pain, will obtain CT pelvis for evaluation of hip fracture.   CT negative for any acute occult fracture.  Patient able to ambulate in the ED.  She did report some pain with ambulation but was able to take several steps in her room.  I discussed with her that this is most likely contusion versus musculoskeletal pain.  At this time, do not suspect occult fracture that would need further evaluation by MRI or acute spinal injury that would need further evaluation.  I did review patient's PMP and she does have a narcotic score.  Her last narcotics was in March 2020.  I did discuss that since it is a holiday weekend, will give short course of pain medication to help with pain.  Encouraged at home supportive care measures. At this time, patient exhibits no emergent life-threatening condition that require further evaluation in ED or admission. Patient had ample opportunity for questions and discussion. All patient's questions were answered with full understanding. Strict return precautions discussed. Patient expresses understanding and agreement to plan.   Portions of this note were generated with [employer]. Dictation errors may occur despite best attempts at proofreading.     Final Clinical Impressions(s) / ED Diagnoses   Final diagnoses:  Back pain  Pain of right hip joint    ED Discharge Orders         Ordered    lidocaine (LIDODERM) 5 %  Every 24 hours     05/18/19 0133    HYDROcodone-acetaminophen (NORCO/VICODIN) 5-325 MG tablet  Every 8 hours     05/18/19 0133           Volanda Napoleon, PA-C 05/19/19 0015    Daleen Bo, MD 05/21/19 [phone]

## 2019-05-17 NOTE — ED Triage Notes (Addendum)
Pt reports that she slipped down the steps and hurt her R hip. Unable to bear weight on R leg. Describes the pain as "grabbing." Denies LOC or head injury.

## 2019-05-18 MED ORDER — HYDROCODONE-ACETAMINOPHEN 5-325 MG PO TABS
1.0000 | ORAL_TABLET | Freq: Once | ORAL | Status: AC
  Administered 2019-05-18: 02:00:00 1 via ORAL
  Filled 2019-05-18: qty 1

## 2019-05-18 MED ORDER — LIDOCAINE 5 % EX PTCH: 1.0000 | MEDICATED_PATCH | CUTANEOUS | 0 refills | Status: DC

## 2019-05-18 MED ORDER — HYDROCODONE-ACETAMINOPHEN 5-325 MG PO TABS: 1.0000 | ORAL_TABLET | Freq: Three times a day (TID) | ORAL | 0 refills | Status: DC

## 2019-05-18 NOTE — Discharge Instructions (Signed)
You can take Tylenol or Ibuprofen as directed for pain. You can alternate Tylenol and Ibuprofen every 4 hours. If you take Tylenol at 1pm, then you can take Ibuprofen at 5pm. Then you can take Tylenol again at 9pm.   Take pain medications as directed for break through pain. Do not drive or operate machinery while taking this medication.   Apply Lidoderm patches as directed.  As we discussed, follow-up with your primary care doctor.  Return the emergency department for any worsening pain, difficulty walking, numbness/weakness of your arms or legs, loss of control of bowel or bladder or any other worsening or concerning symptoms.

## 2019-05-18 NOTE — ED Notes (Signed)
Pt ambulated in room. Pt took several steps some assisted and some unassisted. Pt states that it hurts every time she tries to take a step with her right leg.

## 2019-05-20 ENCOUNTER — Encounter: Payer: Self-pay | Admitting: Internal Medicine

## 2019-05-20 ENCOUNTER — Ambulatory Visit (INDEPENDENT_AMBULATORY_CARE_PROVIDER_SITE_OTHER): Payer: 59 | Admitting: Internal Medicine

## 2019-05-20 DIAGNOSIS — W19XXXD Unspecified fall, subsequent encounter: Secondary | ICD-10-CM | POA: Diagnosis not present

## 2019-05-20 DIAGNOSIS — M25551 Pain in right hip: Secondary | ICD-10-CM | POA: Diagnosis not present

## 2019-05-20 DIAGNOSIS — M545 Low back pain, unspecified: Secondary | ICD-10-CM

## 2019-05-20 MED ORDER — CYCLOBENZAPRINE HCL 10 MG PO TABS: 10.0000 mg | ORAL_TABLET | Freq: Three times a day (TID) | ORAL | 0 refills | Status: DC | PRN

## 2019-05-20 NOTE — Patient Instructions (Signed)
Low Back Sprain Rehab  Ask your health care provider which exercises are safe for you. Do exercises exactly as told by your health care provider and adjust them as directed. It is normal to feel mild stretching, pulling, tightness, or discomfort as you do these exercises, but you should stop right away if you feel sudden pain or your pain gets worse. Do not begin these exercises until told by your health care provider.  Stretching and range of motion exercises  These exercises warm up your muscles and joints and improve the movement and flexibility of your back. These exercises also help to relieve pain, numbness, and tingling.  Exercise A: Lumbar rotation    1. Lie on your back on a firm surface and bend your knees.  2. Straighten your arms out to your sides so each arm forms an "L" shape with a side of your body (a 90 degree angle).  3. Slowly move both of your knees to one side of your body until you feel a stretch in your lower back. Try not to let your shoulders move off of the floor.  4. Hold for __________ seconds.  5. Tense your abdominal muscles and slowly move your knees back to the starting position.  6. Repeat this exercise on the other side of your body.  Repeat __________ times. Complete this exercise __________ times a day.  Exercise B: Prone extension on elbows    1. Lie on your abdomen on a firm surface.  2. Prop yourself up on your elbows.  3. Use your arms to help lift your chest up until you feel a gentle stretch in your abdomen and your lower back.  ? This will place some of your body weight on your elbows. If this is uncomfortable, try stacking pillows under your chest.  ? Your hips should stay down, against the surface that you are lying on. Keep your hip and back muscles relaxed.  4. Hold for __________ seconds.  5. Slowly relax your upper body and return to the starting position.  Repeat __________ times. Complete this exercise __________ times a day.  Strengthening exercises  These  exercises build strength and endurance in your back. Endurance is the ability to use your muscles for a long time, even after they get tired.  Exercise C: Pelvic tilt  1. Lie on your back on a firm surface. Bend your knees and keep your feet flat.  2. Tense your abdominal muscles. Tip your pelvis up toward the ceiling and flatten your lower back into the floor.  ? To help with this exercise, you may place a small towel under your lower back and try to push your back into the towel.  3. Hold for __________ seconds.  4. Let your muscles relax completely before you repeat this exercise.  Repeat __________ times. Complete this exercise __________ times a day.  Exercise D: Alternating arm and leg raises    1. Get on your hands and knees on a firm surface. If you are on a hard floor, you may want to use padding to cushion your knees, such as an exercise mat.  2. Line up your arms and legs. Your hands should be below your shoulders, and your knees should be below your hips.  3. Lift your left leg behind you. At the same time, raise your right arm and straighten it in front of you.  ? Do not lift your leg higher than your hip.  ? Do not lift your arm   higher than your shoulder.  ? Keep your abdominal and back muscles tight.  ? Keep your hips facing the ground.  ? Do not arch your back.  ? Keep your balance carefully, and do not hold your breath.  4. Hold for __________ seconds.  5. Slowly return to the starting position and repeat with your right leg and your left arm.  Repeat __________ times. Complete this exercise __________ times a day.  Exercise E: Abdominal set with straight leg raise    1. Lie on your back on a firm surface.  2. Bend one of your knees and keep your other leg straight.  3. Tense your abdominal muscles and lift your straight leg up, 4-6 inches (10-15 cm) off the ground.  4. Keep your abdominal muscles tight and hold for __________ seconds.  ? Do not hold your breath.  ? Do not arch your back. Keep it  flat against the ground.  5. Keep your abdominal muscles tense as you slowly lower your leg back to the starting position.  6. Repeat with your other leg.  Repeat __________ times. Complete this exercise __________ times a day.  Posture and body mechanics    Body mechanics refers to the movements and positions of your body while you do your daily activities. Posture is part of body mechanics. Good posture and healthy body mechanics can help to relieve stress in your body's tissues and joints. Good posture means that your spine is in its natural S-curve position (your spine is neutral), your shoulders are pulled back slightly, and your head is not tipped forward. The following are general guidelines for applying improved posture and body mechanics to your everyday activities.  Standing    · When standing, keep your spine neutral and your feet about hip-width apart. Keep a slight bend in your knees. Your ears, shoulders, and hips should line up.  · When you do a task in which you stand in one place for a long time, place one foot up on a stable object that is 2-4 inches (5-10 cm) high, such as a footstool. This helps keep your spine neutral.  Sitting    · When sitting, keep your spine neutral and keep your feet flat on the floor. Use a footrest, if necessary, and keep your thighs parallel to the floor. Avoid rounding your shoulders, and avoid tilting your head forward.  · When working at a desk or a computer, keep your desk at a height where your hands are slightly lower than your elbows. Slide your chair under your desk so you are close enough to maintain good posture.  · When working at a computer, place your monitor at a height where you are looking straight ahead and you do not have to tilt your head forward or downward to look at the screen.  Resting    · When lying down and resting, avoid positions that are most painful for you.  · If you have pain with activities such as sitting, bending, stooping, or squatting  (flexion-based activities), lie in a position in which your body does not bend very much. For example, avoid curling up on your side with your arms and knees near your chest (fetal position).  · If you have pain with activities such as standing for a long time or reaching with your arms (extension-based activities), lie with your spine in a neutral position and bend your knees slightly. Try the following positions:  · Lying on your side with a   pillow between your knees.  · Lying on your back with a pillow under your knees.  Lifting    · When lifting objects, keep your feet at least shoulder-width apart and tighten your abdominal muscles.  · Bend your knees and hips and keep your spine neutral. It is important to lift using the strength of your legs, not your back. Do not lock your knees straight out.  · Always ask for help to lift heavy or awkward objects.  This information is not intended to replace advice given to you by your health care provider. Make sure you discuss any questions you have with your health care provider.  Document Released: 12/10/2005 Document Revised: 08/16/2016 Document Reviewed: 09/21/2015  [employer] Interactive Patient Education © 2019 [employer]

## 2019-05-20 NOTE — Progress Notes (Signed)
Virtual Visit via Video Note  I connected with [name] on 05/20/19 at  4:00 PM EDT by a video enabled telemedicine application and verified that I am speaking with the correct person using two identifiers.  Location: Patient: Home Provider: Office   I discussed the limitations of evaluation and management by telemedicine and the availability of in person appointments. The patient expressed understanding and agreed to proceed.  History of Present Illness:  Pt due for ER follow up. She went to the ER 5/24 with c/o right lower back/hip pain s/p fall that occurred 1 day earlier. Xray of the lumbar spine showed:  IMPRESSION: 1. No definite acute displaced fracture or dislocation. 2. Irregularity of the distal sacrum and coccyx on the lateral view may be projectional. Correlation with point tenderness is recommended as an underlying nondisplaced fracture is difficult to exclude.  Xray of the right hip showed:  IMPRESSION: No acute displaced fracture.  No dislocation.  They obtained CT Pelvis for further visualization which showed:  IMPRESSION: 1. No acute displaced fracture or dislocation. If there is persistent high clinical suspicion for an occult fracture follow-up with MRI is recommended. 2. Diverticulosis without CT evidence of diverticulitis. 3. Normal appendix in the right lower quadrant.  She was treated with Fentanyl, Morphine in the ED, given a RX for Lidocaine Patches and  Norco at discharge. Since that time, she reports her pain is persistent but has improved. If she stands for too long, the pain is worse. She is doing Ibuprofen, Tylenol, Lidocaine Patches and heat. She would like to get a muscle relaxer to help her sleep at night if possible.   Past Medical History:  Diagnosis Date  . Allergy   . Anxiety   . Depression   . GERD (gastroesophageal reflux disease)   . History of abnormal cervical Pap smear    1991 -- 1995--hx cryotherapy to cervix  . History of colon  polyps    2008- BENIGN  . Hydronephrosis, left   . Kidney stones 2016  . PONV (postoperative nausea and vomiting)     Current Outpatient Medications  Medication Sig Dispense Refill  . acetaminophen (TYLENOL) 500 MG tablet Take 1,000 mg by mouth every 6 (six) hours as needed for mild pain, moderate pain, fever or headache.    . ALPRAZolam (XANAX) 0.5 MG tablet Take 1 tablet (0.5 mg total) by mouth at bedtime as needed for anxiety or sleep. 20 tablet 1  . busPIRone (BUSPAR) 5 MG tablet Take 1 tablet (5 mg total) by mouth 2 (two) times daily. 180 tablet 1  . FLUoxetine (PROZAC) 10 MG tablet Take 1 tablet (10 mg total) by mouth daily. 90 tablet 1  . HYDROcodone-acetaminophen (NORCO/VICODIN) 5-325 MG tablet Take 1 tablet by mouth every 8 (eight) hours. 6 tablet 0  . lidocaine (LIDODERM) 5 % Place 1 patch onto the skin daily. Remove & Discard patch within 12 hours or as directed by MD 10 patch 0  . Multiple Vitamin (MULTIVITAMIN WITH MINERALS) TABS tablet Take 1 tablet by mouth daily.    . SUMAtriptan (IMITREX) 25 MG tablet Take 1 tablet (25 mg total) by mouth every 2 (two) hours as needed for migraine. May repeat in 2 hours if headache persists or recurs. 30 tablet 1  . tamsulosin (FLOMAX) 0.4 MG CAPS capsule Take 1 capsule (0.4 mg total) by mouth daily. 30 capsule 3  . topiramate (TOPAMAX) 25 MG capsule Take 1 capsule (25 mg total) by mouth daily. 90 capsule 1  No current facility-administered medications for this visit.     Allergies  Allergen Reactions  . Compazine [Prochlorperazine]     Altered mental status  . Desvenlafaxine Other (See Comments)    Reaction:  Withdrawal   . Iodinated Diagnostic Agents Rash    Previously mis-entered as Iohexol allergy. Patient is not allergic to Non-Ionic contrast currently.    Family History  Problem Relation Age of Onset  . Hypertension Mother   . Colon polyps Mother   . Diabetes Father   . Colon polyps Father   . Cancer Paternal Uncle         Pancreas  . Pancreatic cancer Paternal Uncle   . Heart attack Maternal Grandmother   . Drug abuse Sister   . Esophageal cancer Neg Hx   . Stomach cancer Neg Hx   . Rectal cancer Neg Hx   . Colon cancer Neg Hx     Social History   Socioeconomic History  . Marital status: Married    Spouse name: Not on file  . Number of children: Not on file  . Years of education: Not on file  . Highest education level: Not on file  Occupational History  . Not on file  Social Needs  . Financial resource strain: Not on file  . Food insecurity:    Worry: Not on file    Inability: Not on file  . Transportation needs:    Medical: Not on file    Non-medical: Not on file  Tobacco Use  . Smoking status: Former Smoker    Years: 15.00    Types: Cigarettes    Last attempt to quit: 05/16/2008    Years since quitting: 11.0  . Smokeless tobacco: Never Used  Substance and Sexual Activity  . Alcohol use: No    Alcohol/week: 0.0 standard drinks  . Drug use: No  . Sexual activity: Yes    Partners: Male    Birth control/protection: Surgical    Comment: Hyst  Lifestyle  . Physical activity:    Days per week: Not on file    Minutes per session: Not on file  . Stress: Not on file  Relationships  . Social connections:    Talks on phone: Not on file    Gets together: Not on file    Attends religious service: Not on file    Active member of club or organization: Not on file    Attends meetings of clubs or organizations: Not on file    Relationship status: Not on file  . Intimate partner violence:    Fear of current or ex partner: Not on file    Emotionally abused: Not on file    Physically abused: Not on file    Forced sexual activity: Not on file  Other Topics Concern  . Not on file  Social History Narrative  . Not on file     Constitutional: Denies fever, malaise, fatigue, headache or abrupt weight changes.  Respiratory: Denies difficulty breathing, shortness of breath, cough or sputum  production.   Cardiovascular: Denies chest pain, chest tightness, palpitations or swelling in the hands or feet.  Musculoskeletal: Pt reports low back pain. Denies joint swelling.  Skin: Pt reports bruising to right arm and right hip. Denies redness, rashes, lesions or ulcercations.  Neurological: Denies dizziness, difficulty with memory, difficulty with speech or problems with balance and coordination.    No other specific complaints in a complete review of systems (except as listed in HPI above).  LMP 12/24/2006 (Approximate)  Wt Readings from Last 3 Encounters:  05/17/19 240 lb (108.9 kg)  03/04/19 231 lb (104.8 kg)  10/27/18 220 lb (99.8 kg)    General: Appears her stated age, obese, in NAD. Skin: Bruising noted of right posterior forearm, right lower back/hip. Pulmonary/Chest: Normal effort. No respiratory distress.  Musculoskeletal: Pain with flexion and extension. Normal rotation. Gait not visualized but she has difficulty going from a sitting to a standing position. Neurological: Alert and oriented.    BMET    Component Value Date/Time   NA 139 04/01/2018 0857   K 4.6 04/01/2018 0857   CL 104 04/01/2018 0857   CO2 30 04/01/2018 0857   GLUCOSE 98 04/01/2018 0857   BUN 15 04/01/2018 0857   CREATININE 0.82 04/01/2018 0857   CALCIUM 9.2 04/01/2018 0857   GFRNONAA >60 11/15/2017 2142   GFRAA >60 11/15/2017 2142    Lipid Panel     Component Value Date/Time   CHOL 145 04/01/2018 0857   TRIG 130.0 04/01/2018 0857   HDL 35.90 (L) 04/01/2018 0857   CHOLHDL 4 04/01/2018 0857   VLDL 26.0 04/01/2018 0857   LDLCALC 83 04/01/2018 0857    CBC    Component Value Date/Time   WBC 5.6 04/01/2018 0857   RBC 4.52 04/01/2018 0857   HGB 13.2 04/01/2018 0857   HCT 38.5 04/01/2018 0857   PLT 237.0 04/01/2018 0857   MCV 85.2 04/01/2018 0857   MCH 28.9 11/15/2017 2142   MCHC 34.2 04/01/2018 0857   RDW 13.5 04/01/2018 0857   LYMPHSABS 3.0 05/31/2017 2332   MONOABS 0.7  05/31/2017 2332   EOSABS 0.1 05/31/2017 2332   BASOSABS 0.0 05/31/2017 2332    Hgb A1C Lab Results  Component Value Date   HGBA1C 5.8 03/18/2017       Assessment and Plan:  ER Follow Up for Right Lower Back Pain, Right Hip Pain s/p Fall:  ER notes, labs and imaging reviewed Continue Ibuprofen, Tylenol and Lidoderm patches Alternate heat and ice RX for Flexeril 10 mg TID prn- sedation caution given No need for additional imaging at this time  Return precautions discussed  Follow Up Instructions:    I discussed the assessment and treatment plan with the patient. The patient was provided an opportunity to ask questions and all were answered. The patient agreed with the plan and demonstrated an understanding of the instructions.   The patient was advised to call back or seek an in-person evaluation if the symptoms worsen or if the condition fails to improve as anticipated.    Webb Silversmith, NP

## 2019-09-08 NOTE — Progress Notes (Signed)
[name] [address], [place] [address] Phone: [phone] Subjective:   I [name] am serving as a Education administrator for Dr. Hulan Saas.  I'm seeing this patient by the request  of:    CC: Low back and hip pain  [id number]  [name] is a 53 y.o. female coming in with complaint of back and right hip pain. Hip has gotten worse the past couple of months. Patient states she has pain with laying on the right side. Numbness and tingling sometimes in the right foot.   Onset- Chronic  Location - lower back pain, lateral hip pain (IT band like pain that radiates into the knee) Duration-  Character- sharp, dull achy pain all the time  Aggravating factors- sitting for prolonged period of time, walking  Reliving factors-  Therapies tried- Advil, topical  Severity- 8/10 at its worse 5/10 right now      Past Medical History:  Diagnosis Date  . Allergy   . Anxiety   . Depression   . GERD (gastroesophageal reflux disease)   . History of abnormal cervical Pap smear    1991 -- 1995--hx cryotherapy to cervix  . History of colon polyps    2008- BENIGN  . Hydronephrosis, left   . Kidney stones 2016  . PONV (postoperative nausea and vomiting)    Past Surgical History:  Procedure Laterality Date  . ABDOMINAL HYSTERECTOMY    . ANTERIOR AND POSTERIOR REPAIR WITH SACROSPINOUS FIXATION N/A 08/16/2015   Procedure: ANTERIOR COLPORRHAPHY WITH XENOFORM GRAFT AND SACROSPINOUS FIXATION;  Surgeon: Nunzio Cobbs, MD;  Location: [facility];  Service: Gynecology;  Laterality: N/A;  2.5 hours OR time  . BLADDER SUSPENSION N/A 08/16/2015   Procedure: TRANSVAGINAL TAPE (TVT) PROCEDURE exact midurethral sling;  Surgeon: Nunzio Cobbs, MD;  Location: [facility];  Service: Gynecology;  Laterality: N/A;  . COLONOSCOPY    . COLONOSCOPY W/ POLYPECTOMY  05-30-2007  . CYSTOSCOPY N/A 08/16/2015   Procedure: CYSTOSCOPY;  Surgeon: Nunzio Cobbs, MD;   Location: [facility];  Service: Gynecology;  Laterality: N/A;  . CYSTOSCOPY W/ RETROGRADES Bilateral 06/22/2015   Procedure: CYSTOSCOPY WITH RETROGRADE PYELOGRAM;  Surgeon: Cleon Gustin, MD;  Location: [facility];  Service: Urology;  Laterality: Bilateral;  . CYSTOSCOPY W/ URETERAL STENT PLACEMENT  02/  2000  . CYSTOSCOPY WITH HOLMIUM LASER LITHOTRIPSY Left 05/18/2015   Procedure: CYSTOSCOPY WITH HOLMIUM LASER LITHOTRIPSY;  Surgeon: Alexis Frock, MD;  Location: [facility];  Service: Urology;  Laterality: Left;  . CYSTOSCOPY WITH RETROGRADE PYELOGRAM, URETEROSCOPY AND STENT PLACEMENT Left 06/22/2015   Procedure: CYSTOSCOPY,  LEFT URETEROSCOPY;  Surgeon: Cleon Gustin, MD;  Location: [facility];  Service: Urology;  Laterality: Left;  . CYSTOSCOPY WITH URETEROSCOPY AND STENT PLACEMENT Left 05/18/2015   Procedure: CYSTOSCOPY WITH URETEROSCOPY, STONE MANIPULATION AND STENT PLACEMENT;  Surgeon: Alexis Frock, MD;  Location: [facility];  Service: Urology;  Laterality: Left;  . DIAGNOSTIC LAPAROSCOPY    . DX LAPAROSCOPY  X2  . ESOPHAGOGASTRODUODENOSCOPY  05-09-2007  . EXPLORATORY LAPAROTOMY W/ BILATERAL SALPINGECTOMY AND REMOVAL RIGHT ECTOPIC PREG.  02-23-2004  . EXTRACORPOREAL SHOCK WAVE LITHOTRIPSY  2001  &  2002  . LAPAROSCOPIC CHOLECYSTECTOMY  1999  . POLYPECTOMY    . SHOULDER ARTHROSCOPY WITH OPEN ROTATOR CUFF REPAIR Right 2012  . TOTAL ABDOMINAL HYSTERECTOMY W/ BILATERAL OOPHORECTOMY AND LYSIS ADHESIONS  09-02-2007  . TUBAL  LIGATION Bilateral 1995  . UMBILICAL HERNIA REPAIR  2003  . VAGINAL HYSTERECTOMY N/A 08/18/2015   Procedure: Exam under Anesthesia, Excision Xenform Graft, Removal Bilateral Sacrospinous Ligament sutures;  Surgeon: Nunzio Cobbs, MD;  Location: [facility];  Service: Gynecology;  Laterality: N/A;   Social History   Socioeconomic History  . Marital status: Married    Spouse name: Not on file  .  Number of children: Not on file  . Years of education: Not on file  . Highest education level: Not on file  Occupational History  . Not on file  Social Needs  . Financial resource strain: Not on file  . Food insecurity    Worry: Not on file    Inability: Not on file  . Transportation needs    Medical: Not on file    Non-medical: Not on file  Tobacco Use  . Smoking status: Former Smoker    Years: 15.00    Types: Cigarettes    Quit date: 05/16/2008    Years since quitting: 11.3  . Smokeless tobacco: Never Used  Substance and Sexual Activity  . Alcohol use: No    Alcohol/week: 0.0 standard drinks  . Drug use: No  . Sexual activity: Yes    Partners: Male    Birth control/protection: Surgical    Comment: Hyst  Lifestyle  . Physical activity    Days per week: Not on file    Minutes per session: Not on file  . Stress: Not on file  Relationships  . Social Herbalist on phone: Not on file    Gets together: Not on file    Attends religious service: Not on file    Active member of club or organization: Not on file    Attends meetings of clubs or organizations: Not on file    Relationship status: Not on file  Other Topics Concern  . Not on file  Social History Narrative  . Not on file   Allergies  Allergen Reactions  . Compazine [Prochlorperazine]     Altered mental status  . Desvenlafaxine Other (See Comments)    Reaction:  Withdrawal   . Iodinated Diagnostic Agents Rash    Previously mis-entered as Iohexol allergy. Patient is not allergic to Non-Ionic contrast currently.   Family History  Problem Relation Age of Onset  . Hypertension Mother   . Colon polyps Mother   . Diabetes Father   . Colon polyps Father   . Cancer Paternal Uncle        Pancreas  . Pancreatic cancer Paternal Uncle   . Heart attack Maternal Grandmother   . Drug abuse Sister   . Esophageal cancer Neg Hx   . Stomach cancer Neg Hx   . Rectal cancer Neg Hx   . Colon cancer Neg Hx         Current Outpatient Medications (Analgesics):  .  acetaminophen (TYLENOL) 500 MG tablet, Take 1,000 mg by mouth every 6 (six) hours as needed for mild pain, moderate pain, fever or headache. Marland Kitchen  HYDROcodone-acetaminophen (NORCO/VICODIN) 5-325 MG tablet, Take 1 tablet by mouth every 8 (eight) hours. .  SUMAtriptan (IMITREX) 25 MG tablet, Take 1 tablet (25 mg total) by mouth every 2 (two) hours as needed for migraine. May repeat in 2 hours if headache persists or recurs.   Current Outpatient Medications (Other):  Marland Kitchen  ALPRAZolam (XANAX) 0.5 MG tablet, Take 1 tablet (0.5 mg total) by mouth at bedtime as needed  for anxiety or sleep. .  busPIRone (BUSPAR) 5 MG tablet, Take 1 tablet (5 mg total) by mouth 2 (two) times daily. .  cyclobenzaprine (FLEXERIL) 10 MG tablet, Take 1 tablet (10 mg total) by mouth 3 (three) times daily as needed for muscle spasms. Marland Kitchen  FLUoxetine (PROZAC) 10 MG tablet, Take 1 tablet (10 mg total) by mouth daily. Marland Kitchen  lidocaine (LIDODERM) 5 %, Place 1 patch onto the skin daily. Remove & Discard patch within 12 hours or as directed by MD .  Multiple Vitamin (MULTIVITAMIN WITH MINERALS) TABS tablet, Take 1 tablet by mouth daily. .  tamsulosin (FLOMAX) 0.4 MG CAPS capsule, Take 1 capsule (0.4 mg total) by mouth daily. Marland Kitchen  topiramate (TOPAMAX) 25 MG capsule, Take 1 capsule (25 mg total) by mouth daily.    Past medical history, social, surgical and family history all reviewed in electronic medical record.  No pertanent information unless stated regarding to the chief complaint.   Review of Systems:  No headache, visual changes, nausea, vomiting, diarrhea, constipation, dizziness, abdominal pain, skin rash, fevers, chills, night sweats, weight loss, swollen lymph nodes, body aches, joint swelling, muscle aches, chest pain, shortness of breath, mood changes.   Objective  Blood pressure (!) 146/90, pulse 76, height 5\' 6"  (1.676 m), weight 238 lb (108 kg), last menstrual period  12/24/2006, SpO2 94 %.   General: No apparent distress alert and oriented x3 mood and affect normal, dressed appropriately.  HEENT: Pupils equal, extraocular movements intact  Respiratory: Patient's speak in full sentences and does not appear short of breath  Cardiovascular: No lower extremity edema, non tender, no erythema  Skin: Warm dry intact with no signs of infection or rash on extremities or on axial skeleton.  Abdomen: Soft nontender  Neuro: Cranial nerves II through XII are intact, neurovascularly intact in all extremities with 2+ DTRs and 2+ pulses.  Lymph: No lymphadenopathy of posterior or anterior cervical chain or axillae bilaterally.  Gait normal with good balance and coordination.  MSK:  Non tender with full range of motion and good stability and symmetric strength and tone of shoulders, elbows, wrist, knee and ankles bilaterally.  Patient does have some loss of lordosis of the lumbar spine.  Tender to palpation more over the greater trochanteric area right greater than left.  Positive Faber test negative straight leg test bilaterally.  Deep tendon reflexes intact.   Procedure: Real-time Ultrasound Guided Injection of right greater trochanteric bursitis secondary to patient's body habitus Device: GE Logiq Q7 Ultrasound guided injection is preferred based studies that show increased duration, increased effect, greater accuracy, decreased procedural pain, increased response rate, and decreased cost with ultrasound guided versus blind injection.  Verbal informed consent obtained.  Time-out conducted.  Noted no overlying erythema, induration, or other signs of local infection.  Skin prepped in a sterile fashion.  Local anesthesia: Topical Ethyl chloride.  With sterile technique and under real time ultrasound guidance:  Greater trochanteric area was visualized and patient's bursa was noted. A 22-gauge 3 inch needle was inserted and 4 cc of 0.5% Marcaine and 1 cc of Kenalog 40 mg/dL  was injected. Pictures taken Completed without difficulty  Pain immediately resolved suggesting accurate placement of the medication.  Advised to call if fevers/chills, erythema, induration, drainage, or persistent bleeding.  Images permanently stored and available for review in the ultrasound unit.  Impression: Technically successful ultrasound guided injection.   Procedure: Real-time Ultrasound Guided Injection of left  greater trochanteric bursitis secondary to patient's body  habitus Device: GE Logiq Q7  Ultrasound guided injection is preferred based studies that show increased duration, increased effect, greater accuracy, decreased procedural pain, increased response rate, and decreased cost with ultrasound guided versus blind injection.  Verbal informed consent obtained.  Time-out conducted.  Noted no overlying erythema, induration, or other signs of local infection.  Skin prepped in a sterile fashion.  Local anesthesia: Topical Ethyl chloride.  With sterile technique and under real time ultrasound guidance:  Greater trochanteric area was visualized and patient's bursa was noted. A 22-gauge 3 inch needle was inserted and 4 cc of 0.5% Marcaine and 1 cc of Kenalog 40 mg/dL was injected. Pictures taken Completed without difficulty  Pain immediately resolved suggesting accurate placement of the medication.  Advised to call if fevers/chills, erythema, induration, drainage, or persistent bleeding.  Images permanently stored and available for review in the ultrasound unit.  Impression: Technically successful ultrasound guided injection.    Impression and Recommendations:     This case required medical decision making of moderate complexity. The above documentation has been reviewed and is accurate and complete Lyndal Pulley, DO       Note: This dictation was prepared with Dragon dictation along with smaller phrase technology. Any transcriptional errors that result from this process  are unintentional.

## 2019-09-09 ENCOUNTER — Ambulatory Visit (INDEPENDENT_AMBULATORY_CARE_PROVIDER_SITE_OTHER): Payer: 59 | Admitting: Family Medicine

## 2019-09-09 ENCOUNTER — Encounter: Payer: Self-pay | Admitting: Family Medicine

## 2019-09-09 ENCOUNTER — Ambulatory Visit: Payer: Self-pay

## 2019-09-09 ENCOUNTER — Other Ambulatory Visit: Payer: Self-pay

## 2019-09-09 VITALS — BP 146/90 | HR 76 | Ht 66.0 in | Wt 238.0 lb

## 2019-09-09 DIAGNOSIS — M7062 Trochanteric bursitis, left hip: Secondary | ICD-10-CM

## 2019-09-09 DIAGNOSIS — M7061 Trochanteric bursitis, right hip: Secondary | ICD-10-CM | POA: Diagnosis not present

## 2019-09-09 DIAGNOSIS — M25551 Pain in right hip: Secondary | ICD-10-CM

## 2019-09-09 NOTE — Assessment & Plan Note (Signed)
Same plan on the contralateral side.  Differential includes lumbar radiculopathy but low likelihood.  Follow-up again in 4 to 8 weeks if not completely resolved

## 2019-09-09 NOTE — Patient Instructions (Addendum)
Good to see you.  Injected hip today Ice 20 minutes 2 times daily. Usually after activity and before bed. Exercises 3 times a week.  Voltaren or pennsaid pinkie amount topically 2 times daily as needed.  Vitamin D 2000 IU daily  See me again in 5-6 weeks

## 2019-09-09 NOTE — Assessment & Plan Note (Signed)
Injection today: Responded well to this.  Discussed icing regimen and home exercise, discussed which activities to do which was to avoid.  Patient is to increase activity slowly over the course of next several days.  Topical anti-inflammatories and over-the-counter natural supplements.  Follow-up again in 4 to 6 weeks

## 2019-09-28 ENCOUNTER — Ambulatory Visit (INDEPENDENT_AMBULATORY_CARE_PROVIDER_SITE_OTHER)
Admission: RE | Admit: 2019-09-28 | Discharge: 2019-09-28 | Disposition: A | Discharge status: Home / Self Care | Payer: 59 | Source: Ambulatory Visit | Attending: Internal Medicine | Admitting: Internal Medicine

## 2019-09-28 ENCOUNTER — Other Ambulatory Visit: Payer: Self-pay

## 2019-09-28 ENCOUNTER — Encounter: Payer: Self-pay | Admitting: Internal Medicine

## 2019-09-28 ENCOUNTER — Ambulatory Visit (INDEPENDENT_AMBULATORY_CARE_PROVIDER_SITE_OTHER): Payer: 59 | Admitting: Internal Medicine

## 2019-09-28 VITALS — BP 138/84 | HR 83 | Temp 98.0°F | Wt 237.0 lb

## 2019-09-28 DIAGNOSIS — R29898 Other symptoms and signs involving the musculoskeletal system: Secondary | ICD-10-CM

## 2019-09-28 DIAGNOSIS — M79671 Pain in right foot: Secondary | ICD-10-CM

## 2019-09-28 DIAGNOSIS — Z23 Encounter for immunization: Secondary | ICD-10-CM

## 2019-09-28 MED ORDER — METHYLPREDNISOLONE ACETATE 80 MG/ML IJ SUSP
80.0000 mg | Freq: Once | INTRAMUSCULAR | Status: AC
  Administered 2019-09-28: 14:00:00 80 mg via INTRAMUSCULAR

## 2019-09-28 MED ORDER — PREDNISONE 10 MG PO TABS: ORAL_TABLET | ORAL | 0 refills | Status: DC

## 2019-09-28 NOTE — Progress Notes (Signed)
Subjective:    Patient ID: [name], female    DOB: May 18, 1966, 53 y.o.   MRN: [id number]  HPI  Pt presents to the clinic today with c/o right heel pain. This started yesterday. She reports she woke up with the pain. She describes the pain as. She reports associated redness and swelling. The pain is worse with ambulation. She denies any injury to the area. She has tried ice, elevation, ACE wrap, Ibuprofen and Hydrocodone with minimal relief.  Xray of right foot from 08/2017 reviewed, notable for calcaneal spur.  Review of Systems      Past Medical History:  Diagnosis Date  . Allergy   . Anxiety   . Depression   . GERD (gastroesophageal reflux disease)   . History of abnormal cervical Pap smear    1991 -- 1995--hx cryotherapy to cervix  . History of colon polyps    2008- BENIGN  . Hydronephrosis, left   . Kidney stones 2016  . PONV (postoperative nausea and vomiting)     Current Outpatient Medications  Medication Sig Dispense Refill  . acetaminophen (TYLENOL) 500 MG tablet Take 1,000 mg by mouth every 6 (six) hours as needed for mild pain, moderate pain, fever or headache.    . ALPRAZolam (XANAX) 0.5 MG tablet Take 1 tablet (0.5 mg total) by mouth at bedtime as needed for anxiety or sleep. 20 tablet 1  . busPIRone (BUSPAR) 5 MG tablet Take 1 tablet (5 mg total) by mouth 2 (two) times daily. 180 tablet 1  . cyclobenzaprine (FLEXERIL) 10 MG tablet Take 1 tablet (10 mg total) by mouth 3 (three) times daily as needed for muscle spasms. 30 tablet 0  . FLUoxetine (PROZAC) 10 MG tablet Take 1 tablet (10 mg total) by mouth daily. 90 tablet 1  . HYDROcodone-acetaminophen (NORCO/VICODIN) 5-325 MG tablet Take 1 tablet by mouth every 8 (eight) hours. 6 tablet 0  . lidocaine (LIDODERM) 5 % Place 1 patch onto the skin daily. Remove & Discard patch within 12 hours or as directed by MD 10 patch 0  . Multiple Vitamin (MULTIVITAMIN WITH MINERALS) TABS tablet Take 1 tablet by mouth daily.    .  SUMAtriptan (IMITREX) 25 MG tablet Take 1 tablet (25 mg total) by mouth every 2 (two) hours as needed for migraine. May repeat in 2 hours if headache persists or recurs. 30 tablet 1  . tamsulosin (FLOMAX) 0.4 MG CAPS capsule Take 1 capsule (0.4 mg total) by mouth daily. 30 capsule 3  . topiramate (TOPAMAX) 25 MG capsule Take 1 capsule (25 mg total) by mouth daily. 90 capsule 1   No current facility-administered medications for this visit.     Allergies  Allergen Reactions  . Compazine [Prochlorperazine]     Altered mental status  . Desvenlafaxine Other (See Comments)    Reaction:  Withdrawal   . Iodinated Diagnostic Agents Rash    Previously mis-entered as Iohexol allergy. Patient is not allergic to Non-Ionic contrast currently.    Family History  Problem Relation Age of Onset  . Hypertension Mother   . Colon polyps Mother   . Diabetes Father   . Colon polyps Father   . Cancer Paternal Uncle        Pancreas  . Pancreatic cancer Paternal Uncle   . Heart attack Maternal Grandmother   . Drug abuse Sister   . Esophageal cancer Neg Hx   . Stomach cancer Neg Hx   . Rectal cancer Neg Hx   .  Colon cancer Neg Hx     Social History   Socioeconomic History  . Marital status: Married    Spouse name: Not on file  . Number of children: Not on file  . Years of education: Not on file  . Highest education level: Not on file  Occupational History  . Not on file  Social Needs  . Financial resource strain: Not on file  . Food insecurity    Worry: Not on file    Inability: Not on file  . Transportation needs    Medical: Not on file    Non-medical: Not on file  Tobacco Use  . Smoking status: Former Smoker    Years: 15.00    Types: Cigarettes    Quit date: 05/16/2008    Years since quitting: 11.3  . Smokeless tobacco: Never Used  Substance and Sexual Activity  . Alcohol use: No    Alcohol/week: 0.0 standard drinks  . Drug use: No  . Sexual activity: Yes    Partners: Male     Birth control/protection: Surgical    Comment: Hyst  Lifestyle  . Physical activity    Days per week: Not on file    Minutes per session: Not on file  . Stress: Not on file  Relationships  . Social Herbalist on phone: Not on file    Gets together: Not on file    Attends religious service: Not on file    Active member of club or organization: Not on file    Attends meetings of clubs or organizations: Not on file    Relationship status: Not on file  . Intimate partner violence    Fear of current or ex partner: Not on file    Emotionally abused: Not on file    Physically abused: Not on file    Forced sexual activity: Not on file  Other Topics Concern  . Not on file  Social History Narrative  . Not on file     Constitutional: Denies fever, malaise, fatigue, headache or abrupt weight changes.  Musculoskeletal: Pt reports right heel pain and swelling, difficulty with gait.  Denies muscle pain or joint.  Skin: Pt reports redness of right heel. Denies rashes, lesions or ulcercations.    No other specific complaints in a complete review of systems (except as listed in HPI above).  Objective:   Physical Exam   BP 138/84   Pulse 83   Temp 98 F (36.7 C) (Temporal)   Wt 237 lb (107.5 kg)   LMP 12/24/2006 (Approximate)   SpO2 98%   BMI 38.25 kg/m  Wt Readings from Last 3 Encounters:  09/28/19 237 lb (107.5 kg)  09/09/19 238 lb (108 kg)  05/17/19 240 lb (108.9 kg)    General: Appears her stated age, obese, in NAD. Skin: Warm, dry and intact. No redness noted over posterior right heel. Cardiovascular: Normal rate and rhythm.  Pulmonary/Chest: Normal effort and positive vesicular breath sounds. No respiratory distress. No wheezes, rales or ronchi noted.  Musculoskeletal: Decreased flexion, extension and rotation of the right ankle due to pain. She has a palpable swollen area to the posterior heel, tender to palpation. Minimal pain with palpation of the Achilles  tendon. Strength 3/5 RLE, 5/5 LLE. Limping gait Neurological: Alert and oriented. Sensation intact to BLE.   BMET    Component Value Date/Time   NA 139 04/01/2018 0857   K 4.6 04/01/2018 0857   CL 104 04/01/2018 0857  CO2 30 04/01/2018 0857   GLUCOSE 98 04/01/2018 0857   BUN 15 04/01/2018 0857   CREATININE 0.82 04/01/2018 0857   CALCIUM 9.2 04/01/2018 0857   GFRNONAA >60 11/15/2017 2142   GFRAA >60 11/15/2017 2142    Lipid Panel     Component Value Date/Time   CHOL 145 04/01/2018 0857   TRIG 130.0 04/01/2018 0857   HDL 35.90 (L) 04/01/2018 0857   CHOLHDL 4 04/01/2018 0857   VLDL 26.0 04/01/2018 0857   LDLCALC 83 04/01/2018 0857    CBC    Component Value Date/Time   WBC 5.6 04/01/2018 0857   RBC 4.52 04/01/2018 0857   HGB 13.2 04/01/2018 0857   HCT 38.5 04/01/2018 0857   PLT 237.0 04/01/2018 0857   MCV 85.2 04/01/2018 0857   MCH 28.9 11/15/2017 2142   MCHC 34.2 04/01/2018 0857   RDW 13.5 04/01/2018 0857   LYMPHSABS 3.0 05/31/2017 2332   MONOABS 0.7 05/31/2017 2332   EOSABS 0.1 05/31/2017 2332   BASOSABS 0.0 05/31/2017 2332    Hgb A1C Lab Results  Component Value Date   HGBA1C 5.8 03/18/2017           Assessment & Plan:   Intractable Right Heel Pain:  DDx include posterior calcaneal heal spur vs Achilles tendonitis Xray right foot today 80 mg Depo Medrol IM today ACE wrap applied Encouraged ice and elevation Crutches given to avoid overuse RX for Pred Taper x 10 days  Return precautions discussed Webb Silversmith, NP

## 2019-09-28 NOTE — Patient Instructions (Signed)
Achilles Tendinitis  Achilles tendinitis is inflammation of the tough, cord-like band that attaches the lower leg muscles to the heel bone (Achilles tendon). This is usually caused by overusing the tendon and the ankle joint. Achilles tendinitis usually gets better over time with treatment and caring for yourself at home. It can take weeks or months to heal completely. What are the causes? This condition may be caused by:  A sudden increase in exercise or activity, such as running.  Doing the same exercises or activities (such as jumping) over and over.  Not warming up calf muscles before exercising.  Exercising in shoes that are worn out or not made for exercise.  Having arthritis or a bone growth (spur) on the back of the heel bone. This can rub against the tendon and hurt it.  Age-related wear and tear. Tendons become less flexible with age and more likely to be injured. What are the signs or symptoms? Common symptoms of this condition include:  Pain in the Achilles tendon or in the back of the leg, just above the heel. The pain usually gets worse with exercise.  Stiffness or soreness in the back of the leg, especially in the morning.  Swelling of the skin over the Achilles tendon.  Thickening of the tendon.  Bone spurs at the bottom of the Achilles tendon, near the heel.  Trouble standing on tiptoe. How is this diagnosed? This condition is diagnosed based on your symptoms and a physical exam. You may have tests, including:  X-rays.  MRI. How is this treated? The goal of treatment is to relieve symptoms and help your injury heal. Treatment may include:  Decreasing or stopping activities that caused the tendinitis. This may mean switching to low-impact exercises like biking or swimming.  Icing the injured area.  Doing physical therapy, including strengthening and stretching exercises.  NSAIDs to help relieve pain and swelling.  Using supportive shoes, wraps, heel  lifts, or a walking boot (air cast).  Surgery. This may be done if your symptoms do not improve after 6 months.  Using high-energy shock wave impulses to stimulate the healing process (extracorporeal shock wave therapy). This is rare.  Injection of medicines to help relieve inflammation (corticosteroids). This is rare. Follow these instructions at home: If you have an air cast:  Wear the cast as told by your health care provider. Remove it only as told by your health care provider.  Loosen the cast if your toes tingle, become numb, or turn cold and blue. Activity  Gradually return to your normal activities once your health care provider approves. Do not do activities that cause pain. ? Consider doing low-impact exercises, like cycling or swimming.  If you have an air cast, ask your health care provider when it is safe for you to drive.  If physical therapy was prescribed, do exercises as told by your health care provider or physical therapist. Managing pain, stiffness, and swelling   Raise (elevate) your foot above the level of your heart while you are sitting or lying down.  Move your toes often to avoid stiffness and to lessen swelling.  If directed, put ice on the injured area: ? Put ice in a plastic bag. ? Place a towel between your skin and the bag. ? Leave the ice on for 20 minutes, 2-3 times a day General instructions  If directed, wrap your foot with an elastic bandage or other wrap. This can help keep your tendon from moving too much while it   heals. Your health care provider will show you how to wrap your foot correctly.  Wear supportive shoes or heel lifts only as told by your health care provider.  Take over-the-counter and prescription medicines only as told by your health care provider.  Keep all follow-up visits as told by your health care provider. This is important. Contact a health care provider if:  You have symptoms that gets worse.  You have pain that  does not get better with medicine.  You develop new, unexplained symptoms.  You develop warmth and swelling in your foot.  You have a fever. Get help right away if:  You have a sudden popping sound or sensation in your Achilles tendon followed by severe pain.  You cannot move your toes or foot.  You cannot put any weight on your foot. Summary  Achilles tendinitis is inflammation of the tough, cord-like band that attaches the lower leg muscles to the heel bone (Achilles tendon).  This condition is usually caused by overusing the tendon and the ankle joint. It can also be caused by arthritis or normal aging.  The most common symptoms of this condition include pain, swelling, or stiffness in the Achilles tendon or in the back of the leg.  This condition is usually treated with rest, NSAIDs, and physical therapy. This information is not intended to replace advice given to you by your health care provider. Make sure you discuss any questions you have with your health care provider. Document Released: 09/19/2005 Document Revised: 11/22/2017 Document Reviewed: 10/29/2016 Elsevier Patient Education  2020 [employer]  

## 2019-09-28 NOTE — Addendum Note (Signed)
Addended by: Lurlean Nanny on: 09/28/2019 02:02 PM   Modules accepted: Orders

## 2019-09-29 ENCOUNTER — Other Ambulatory Visit: Payer: Self-pay | Admitting: Internal Medicine

## 2019-09-29 DIAGNOSIS — M7661 Achilles tendinitis, right leg: Secondary | ICD-10-CM

## 2019-10-12 ENCOUNTER — Ambulatory Visit (INDEPENDENT_AMBULATORY_CARE_PROVIDER_SITE_OTHER): Payer: 59 | Admitting: Podiatry

## 2019-10-12 ENCOUNTER — Other Ambulatory Visit: Payer: Self-pay | Admitting: Podiatry

## 2019-10-12 ENCOUNTER — Other Ambulatory Visit: Payer: Self-pay

## 2019-10-12 DIAGNOSIS — M7581 Other shoulder lesions, right shoulder: Secondary | ICD-10-CM | POA: Diagnosis not present

## 2019-10-12 DIAGNOSIS — M7661 Achilles tendinitis, right leg: Secondary | ICD-10-CM

## 2019-10-12 DIAGNOSIS — M79671 Pain in right foot: Secondary | ICD-10-CM

## 2019-10-12 DIAGNOSIS — M7752 Other enthesopathy of left foot: Secondary | ICD-10-CM | POA: Diagnosis not present

## 2019-10-12 MED ORDER — MELOXICAM 15 MG PO TABS: 15.0000 mg | ORAL_TABLET | Freq: Every day | ORAL | 0 refills | Status: DC

## 2019-10-12 NOTE — Patient Instructions (Addendum)
For instructions on how to put on your Night Splint, please visit [email or web address]  If was nice to meet you today. If you have any questions or any further concerns, please feel fee to give me a call. You can call our office at [phone] or please feel fee to send me a message through [place].  ---   Achilles Tendinitis  with Rehab Achilles tendinitis is a disorder of the Achilles tendon. The Achilles tendon connects the large calf muscles (Gastrocnemius and Soleus) to the heel bone (calcaneus). This tendon is sometimes called the heel cord. It is important for pushing-off and standing on your toes and is important for walking, running, or jumping. Tendinitis is often caused by overuse and repetitive microtrauma. SYMPTOMS  Pain, tenderness, swelling, warmth, and redness may occur over the Achilles tendon even at rest.  Pain with pushing off, or flexing or extending the ankle.  Pain that is worsened after or during activity. CAUSES   Overuse sometimes seen with rapid increase in exercise programs or in sports requiring running and jumping.  Poor physical conditioning (strength and flexibility or endurance).  Running sports, especially training running down hills.  Inadequate warm-up before practice or play or failure to stretch before participation.  Injury to the tendon. PREVENTION   Warm up and stretch before practice or competition.  Allow time for adequate rest and recovery between practices and competition.  Keep up conditioning.  Keep up ankle and leg flexibility.  Improve or keep muscle strength and endurance.  Improve cardiovascular fitness.  Use proper technique.  Use proper equipment (shoes, skates).  To help prevent recurrence, taping, protective strapping, or an adhesive bandage may be recommended for several weeks after healing is complete. PROGNOSIS   Recovery may take weeks to several months to heal.  Longer recovery is expected if symptoms  have been prolonged.  Recovery is usually quicker if the inflammation is due to a direct blow as compared with overuse or sudden strain. RELATED COMPLICATIONS   Healing time will be prolonged if the condition is not correctly treated. The injury must be given plenty of time to heal.  Symptoms can reoccur if activity is resumed too soon.  Untreated, tendinitis may increase the risk of tendon rupture requiring additional time for recovery and possibly surgery. TREATMENT   The first treatment consists of rest anti-inflammatory medication, and ice to relieve the pain.  Stretching and strengthening exercises after resolution of pain will likely help reduce the risk of recurrence. Referral to a physical therapist or athletic trainer for further evaluation and treatment may be helpful.  A walking boot or cast may be recommended to rest the Achilles tendon. This can help break the cycle of inflammation and microtrauma.  Arch supports (orthotics) may be prescribed or recommended by your caregiver as an adjunct to therapy and rest.  Surgery to remove the inflamed tendon lining or degenerated tendon tissue is rarely necessary and has shown less than predictable results. MEDICATION   Nonsteroidal anti-inflammatory medications, such as aspirin and ibuprofen, may be used for pain and inflammation relief. Do not take within 7 days before surgery. Take these as directed by your caregiver. Contact your caregiver immediately if any bleeding, stomach upset, or signs of allergic reaction occur. Other minor pain relievers, such as acetaminophen, may also be used.  Pain relievers may be prescribed as necessary by your caregiver. Do not take prescription pain medication for longer than 4 to 7 days. Use only as directed and only as much as  you need.  Cortisone injections are rarely indicated. Cortisone injections may weaken tendons and predispose to rupture. It is better to give the condition more time to heal  than to use them. HEAT AND COLD  Cold is used to relieve pain and reduce inflammation for acute and chronic Achilles tendinitis. Cold should be applied for 10 to 15 minutes every 2 to 3 hours for inflammation and pain and immediately after any activity that aggravates your symptoms. Use ice packs or an ice massage.  Heat may be used before performing stretching and strengthening activities prescribed by your caregiver. Use a heat pack or a warm soak. SEEK MEDICAL CARE IF:  Symptoms get worse or do not improve in 2 weeks despite treatment.  New, unexplained symptoms develop. Drugs used in treatment may produce side effects.  EXERCISES:  RANGE OF MOTION (ROM) AND STRETCHING EXERCISES - Achilles Tendinitis  These exercises may help you when beginning to rehabilitate your injury. Your symptoms may resolve with or without further involvement from your physician, physical therapist or athletic trainer. While completing these exercises, remember:   Restoring tissue flexibility helps normal motion to return to the joints. This allows healthier, less painful movement and activity.  An effective stretch should be held for at least 30 seconds.  A stretch should never be painful. You should only feel a gentle lengthening or release in the stretched tissue.  STRETCH  Gastroc, Standing   Place hands on wall.  Extend right / left leg, keeping the front knee somewhat bent.  Slightly point your toes inward on your back foot.  Keeping your right / left heel on the floor and your knee straight, shift your weight toward the wall, not allowing your back to arch.  You should feel a gentle stretch in the right / left calf. Hold this position for 10 seconds. Repeat 3 times. Complete this stretch 2 times per day.  STRETCH  Soleus, Standing   Place hands on wall.  Extend right / left leg, keeping the other knee somewhat bent.  Slightly point your toes inward on your back foot.  Keep your right /  left heel on the floor, bend your back knee, and slightly shift your weight over the back leg so that you feel a gentle stretch deep in your back calf.  Hold this position for 10 seconds. Repeat 3 times. Complete this stretch 2 times per day.  STRETCH  Gastrocsoleus, Standing  Note: This exercise can place a lot of stress on your foot and ankle. Please complete this exercise only if specifically instructed by your caregiver.   Place the ball of your right / left foot on a step, keeping your other foot firmly on the same step.  Hold on to the wall or a rail for balance.  Slowly lift your other foot, allowing your body weight to press your heel down over the edge of the step.  You should feel a stretch in your right / left calf.  Hold this position for 10 seconds.  Repeat this exercise with a slight bend in your knee. Repeat 3 times. Complete this stretch 2 times per day.   STRENGTHENING EXERCISES - Achilles Tendinitis These exercises may help you when beginning to rehabilitate your injury. They may resolve your symptoms with or without further involvement from your physician, physical therapist or athletic trainer. While completing these exercises, remember:   Muscles can gain both the endurance and the strength needed for everyday activities through controlled exercises.  Complete  these exercises as instructed by your physician, physical therapist or athletic trainer. Progress the resistance and repetitions only as guided.  You may experience muscle soreness or fatigue, but the pain or discomfort you are trying to eliminate should never worsen during these exercises. If this pain does worsen, stop and make certain you are following the directions exactly. If the pain is still present after adjustments, discontinue the exercise until you can discuss the trouble with your clinician.  STRENGTH - Plantar-flexors   Sit with your right / left leg extended. Holding onto both ends of a rubber  exercise band/tubing, loop it around the ball of your foot. Keep a slight tension in the band.  Slowly push your toes away from you, pointing them downward.  Hold this position for 10 seconds. Return slowly, controlling the tension in the band/tubing. Repeat 3 times. Complete this exercise 2 times per day.   STRENGTH - Plantar-flexors   Stand with your feet shoulder width apart. Steady yourself with a wall or table using as little support as needed.  Keeping your weight evenly spread over the width of your feet, rise up on your toes.*  Hold this position for 10 seconds. Repeat 3 times. Complete this exercise 2 times per day.  *If this is too easy, shift your weight toward your right / left leg until you feel challenged. Ultimately, you may be asked to do this exercise with your right / left foot only.  STRENGTH  Plantar-flexors, Eccentric  Note: This exercise can place a lot of stress on your foot and ankle. Please complete this exercise only if specifically instructed by your caregiver.   Place the balls of your feet on a step. With your hands, use only enough support from a wall or rail to keep your balance.  Keep your knees straight and rise up on your toes.  Slowly shift your weight entirely to your right / left toes and pick up your opposite foot. Gently and with controlled movement, lower your weight through your right / left foot so that your heel drops below the level of the step. You will feel a slight stretch in the back of your calf at the end position.  Use the healthy leg to help rise up onto the balls of both feet, then lower weight only on the right / left leg again. Build up to 15 repetitions. Then progress to 3 consecutive sets of 15 repetitions.*  After completing the above exercise, complete the same exercise with a slight knee bend (about 30 degrees). Again, build up to 15 repetitions. Then progress to 3 consecutive sets of 15 repetitions.* Perform this exercise 2  times per day.  *When you easily complete 3 sets of 15, your physician, physical therapist or athletic trainer may advise you to add resistance by wearing a backpack filled with additional weight.  STRENGTH - Plantar Flexors, Seated   Sit on a chair that allows your feet to rest flat on the ground. If necessary, sit at the edge of the chair.  Keeping your toes firmly on the ground, lift your right / left heel as far as you can without increasing any discomfort in your ankle. Repeat 3 times. Complete this exercise 2 times a day.

## 2019-10-12 NOTE — Progress Notes (Signed)
Subjective:   Patient ID: [name], female   DOB: 53 y.o.   MRN: [id number]   HPI 53 year old female presents the office with concerns of pain to the back of her right heel as well as swelling which is been under the last 2 weeks.  She said it is intermittent your pain level is 3/10.  She denies any recent injury.  She denies any redness or warmth.  She is tried icing and resting.  She says it hurts worse with weightbearing and prolonged walking.  She did see her primary care physician on x-ray performed.  She was prescribed prednisone and was told she had a bone spur.  The prednisone was helpful.  She is able to walk better.  On the left foot she states that intermittently she will also get some pain to the outside aspect of the foot.  This is an ongoing issue no recent injury.  No swelling.   Review of Systems  All other systems reviewed and are negative.  Past Medical History:  Diagnosis Date  . Allergy   . Anxiety   . Depression   . GERD (gastroesophageal reflux disease)   . History of abnormal cervical Pap smear    1991 -- 1995--hx cryotherapy to cervix  . History of colon polyps    2008- BENIGN  . Hydronephrosis, left   . Kidney stones 2016  . PONV (postoperative nausea and vomiting)     Past Surgical History:  Procedure Laterality Date  . ABDOMINAL HYSTERECTOMY    . ANTERIOR AND POSTERIOR REPAIR WITH SACROSPINOUS FIXATION N/A 08/16/2015   Procedure: ANTERIOR COLPORRHAPHY WITH XENOFORM GRAFT AND SACROSPINOUS FIXATION;  Surgeon: Nunzio Cobbs, MD;  Location: [facility];  Service: Gynecology;  Laterality: N/A;  2.5 hours OR time  . BLADDER SUSPENSION N/A 08/16/2015   Procedure: TRANSVAGINAL TAPE (TVT) PROCEDURE exact midurethral sling;  Surgeon: Nunzio Cobbs, MD;  Location: [facility];  Service: Gynecology;  Laterality: N/A;  . COLONOSCOPY    . COLONOSCOPY W/ POLYPECTOMY  05-30-2007  . CYSTOSCOPY N/A 08/16/2015   Procedure: CYSTOSCOPY;  Surgeon: Nunzio Cobbs, MD;  Location: [facility];  Service: Gynecology;  Laterality: N/A;  . CYSTOSCOPY W/ RETROGRADES Bilateral 06/22/2015   Procedure: CYSTOSCOPY WITH RETROGRADE PYELOGRAM;  Surgeon: Cleon Gustin, MD;  Location: [facility];  Service: Urology;  Laterality: Bilateral;  . CYSTOSCOPY W/ URETERAL STENT PLACEMENT  02/  2000  . CYSTOSCOPY WITH HOLMIUM LASER LITHOTRIPSY Left 05/18/2015   Procedure: CYSTOSCOPY WITH HOLMIUM LASER LITHOTRIPSY;  Surgeon: Alexis Frock, MD;  Location: [facility];  Service: Urology;  Laterality: Left;  . CYSTOSCOPY WITH RETROGRADE PYELOGRAM, URETEROSCOPY AND STENT PLACEMENT Left 06/22/2015   Procedure: CYSTOSCOPY,  LEFT URETEROSCOPY;  Surgeon: Cleon Gustin, MD;  Location: [facility];  Service: Urology;  Laterality: Left;  . CYSTOSCOPY WITH URETEROSCOPY AND STENT PLACEMENT Left 05/18/2015   Procedure: CYSTOSCOPY WITH URETEROSCOPY, STONE MANIPULATION AND STENT PLACEMENT;  Surgeon: Alexis Frock, MD;  Location: [facility];  Service: Urology;  Laterality: Left;  . DIAGNOSTIC LAPAROSCOPY    . DX LAPAROSCOPY  X2  . ESOPHAGOGASTRODUODENOSCOPY  05-09-2007  . EXPLORATORY LAPAROTOMY W/ BILATERAL SALPINGECTOMY AND REMOVAL RIGHT ECTOPIC PREG.  02-23-2004  . EXTRACORPOREAL SHOCK WAVE LITHOTRIPSY  2001  &  2002  . LAPAROSCOPIC CHOLECYSTECTOMY  1999  . POLYPECTOMY    . SHOULDER ARTHROSCOPY WITH OPEN ROTATOR CUFF REPAIR Right 2012  . TOTAL  ABDOMINAL HYSTERECTOMY W/ BILATERAL OOPHORECTOMY AND LYSIS ADHESIONS  09-02-2007  . TUBAL LIGATION Bilateral 1995  . UMBILICAL HERNIA REPAIR  2003  . VAGINAL HYSTERECTOMY N/A 08/18/2015   Procedure: Exam under Anesthesia, Excision Xenform Graft, Removal Bilateral Sacrospinous Ligament sutures;  Surgeon: Nunzio Cobbs, MD;  Location: [facility];  Service: Gynecology;  Laterality: N/A;     Current Outpatient Medications:  .  acetaminophen (TYLENOL) 500 MG tablet,  Take 1,000 mg by mouth every 6 (six) hours as needed for mild pain, moderate pain, fever or headache., Disp: , Rfl:  .  ALPRAZolam (XANAX) 0.5 MG tablet, Take 1 tablet (0.5 mg total) by mouth at bedtime as needed for anxiety or sleep., Disp: 20 tablet, Rfl: 1 .  busPIRone (BUSPAR) 5 MG tablet, Take 1 tablet (5 mg total) by mouth 2 (two) times daily. (Patient not taking: Reported on 09/28/2019), Disp: 180 tablet, Rfl: 1 .  cyclobenzaprine (FLEXERIL) 10 MG tablet, Take 1 tablet (10 mg total) by mouth 3 (three) times daily as needed for muscle spasms., Disp: 30 tablet, Rfl: 0 .  FLUoxetine (PROZAC) 10 MG tablet, Take 1 tablet (10 mg total) by mouth daily. (Patient not taking: Reported on 09/28/2019), Disp: 90 tablet, Rfl: 1 .  HYDROcodone-acetaminophen (NORCO/VICODIN) 5-325 MG tablet, Take 1 tablet by mouth every 8 (eight) hours., Disp: 6 tablet, Rfl: 0 .  ibuprofen (ADVIL) 200 MG tablet, Take 200 mg by mouth every 6 (six) hours as needed., Disp: , Rfl:  .  lidocaine (LIDODERM) 5 %, Place 1 patch onto the skin daily. Remove & Discard patch within 12 hours or as directed by MD, Disp: 10 patch, Rfl: 0 .  Multiple Vitamin (MULTIVITAMIN WITH MINERALS) TABS tablet, Take 1 tablet by mouth daily., Disp: , Rfl:  .  predniSONE (DELTASONE) 10 MG tablet, 20 mg PO x daily x 5 days, 10 mg PO daily x 5 days, Disp: 15 tablet, Rfl: 0 .  SUMAtriptan (IMITREX) 25 MG tablet, Take 1 tablet (25 mg total) by mouth every 2 (two) hours as needed for migraine. May repeat in 2 hours if headache persists or recurs., Disp: 30 tablet, Rfl: 1  Allergies  Allergen Reactions  . Compazine [Prochlorperazine]     Altered mental status  . Desvenlafaxine Other (See Comments)    Reaction:  Withdrawal   . Iodinated Diagnostic Agents Rash    Previously mis-entered as Iohexol allergy. Patient is not allergic to Non-Ionic contrast currently.         Objective:  Physical Exam  General: AAO x3, NAD  Dermatological: Skin is warm, dry  and supple bilateral. Nails x 10 are well manicured; remaining integument appears unremarkable at this time. There are no open sores, no preulcerative lesions, no rash or signs of infection present.  Vascular: Dorsalis Pedis artery and Posterior Tibial artery pedal pulses are 2/4 bilateral with immedate capillary fill time. Pedal hair growth present. No varicosities and no lower extremity edema present bilateral. There is no pain with calf compression, swelling, warmth, erythema.   Neruologic: Grossly intact via light touch bilateral. Vibratory intact via tuning fork bilateral. Protective threshold with Semmes Wienstein monofilament intact to all pedal sites bilateral.  Negative Tinel sign.  Musculoskeletal: On the right side there is tenderness on the mid substance of the Achilles tendon but minimal edema there is no erythema or warmth.  Thompson test is negative.  No deficit noted within the Achilles tendon and overall tendon appears to be intact.  Slightly supinated foot  type.  Subjectively she is doing tenderness on the plantar surface of bilateral toes in tenderness today.  Muscular strength 5/5 in all groups tested bilateral.  Gait: Unassisted, Nonantalgic.       Assessment:    Right Achilles tendinitis, posterior bone spur; left foot neuritis     Plan:  -Treatment options discussed including all alternatives, risks, and complications -Etiology of symptoms were discussed -Independently reviewed the x-rays.  Posterior calcaneal bone spurs present.  No evidence of acute fracture. -Night splint -Prescribed mobic. Discussed side effects of the medication and directed to stop if any are to occur and call the office.  -Stretching/icing daily -Discussed shoe modifications and inserts. She has been wearing a flexible shoe.   Trula Slade DPM

## 2019-10-14 ENCOUNTER — Ambulatory Visit: Payer: 59 | Admitting: Family Medicine

## 2019-10-14 ENCOUNTER — Encounter: Payer: Self-pay | Admitting: Internal Medicine

## 2019-10-14 NOTE — Progress Notes (Deleted)
[name] [address] [place], [place] [address] Phone: [phone] Subjective:    I'm seeing this patient by the request  of:    CC: R hip and back  [id number]    09/09/19: R hip: Injection today: Responded well to this.  Discussed icing regimen and home exercise, discussed which activities to do which was to avoid.  Patient is to increase activity slowly over the course of next several days.  Topical anti-inflammatories and over-the-counter natural supplements.  Follow-up again in 4 to 6 weeks  Update- 10/14/19 [name] is a 53 y.o. female coming in with complaint of right hip pain. Injected last visit. Patient states       Past Medical History:  Diagnosis Date  . Allergy   . Anxiety   . Depression   . GERD (gastroesophageal reflux disease)   . History of abnormal cervical Pap smear    1991 -- 1995--hx cryotherapy to cervix  . History of colon polyps    2008- BENIGN  . Hydronephrosis, left   . Kidney stones 2016  . PONV (postoperative nausea and vomiting)    Past Surgical History:  Procedure Laterality Date  . ABDOMINAL HYSTERECTOMY    . ANTERIOR AND POSTERIOR REPAIR WITH SACROSPINOUS FIXATION N/A 08/16/2015   Procedure: ANTERIOR COLPORRHAPHY WITH XENOFORM GRAFT AND SACROSPINOUS FIXATION;  Surgeon: Nunzio Cobbs, MD;  Location: [facility];  Service: Gynecology;  Laterality: N/A;  2.5 hours OR time  . BLADDER SUSPENSION N/A 08/16/2015   Procedure: TRANSVAGINAL TAPE (TVT) PROCEDURE exact midurethral sling;  Surgeon: Nunzio Cobbs, MD;  Location: [facility];  Service: Gynecology;  Laterality: N/A;  . COLONOSCOPY    . COLONOSCOPY W/ POLYPECTOMY  05-30-2007  . CYSTOSCOPY N/A 08/16/2015   Procedure: CYSTOSCOPY;  Surgeon: Nunzio Cobbs, MD;  Location: [facility];  Service: Gynecology;  Laterality: N/A;  . CYSTOSCOPY W/ RETROGRADES Bilateral 06/22/2015   Procedure: CYSTOSCOPY WITH RETROGRADE PYELOGRAM;  Surgeon:  Cleon Gustin, MD;  Location[facility];  Service: Urology;  Laterality: Bilateral;  . CYSTOSCOPY W/ URETERAL STENT PLACEMENT  02/  2000  . CYSTOSCOPY WITH HOLMIUM LASER LITHOTRIPSY Left 05/18/2015   Procedure: CYSTOSCOPY WITH HOLMIUM LASER LITHOTRIPSY;  Surgeon: Alexis Frock, MD;  Location: [facility];  Service: Urology;  Laterality: Left;  . CYSTOSCOPY WITH RETROGRADE PYELOGRAM, URETEROSCOPY AND STENT PLACEMENT Left 06/22/2015   Procedure: CYSTOSCOPY,  LEFT URETEROSCOPY;  Surgeon: Cleon Gustin, MD;  Location: [facility];  Service: Urology;  Laterality: Left;  . CYSTOSCOPY WITH URETEROSCOPY AND STENT PLACEMENT Left 05/18/2015   Procedure: CYSTOSCOPY WITH URETEROSCOPY, STONE MANIPULATION AND STENT PLACEMENT;  Surgeon: Alexis Frock, MD;  Location: [facility];  Service: Urology;  Laterality: Left;  . DIAGNOSTIC LAPAROSCOPY    . DX LAPAROSCOPY  X2  . ESOPHAGOGASTRODUODENOSCOPY  05-09-2007  . EXPLORATORY LAPAROTOMY W/ BILATERAL SALPINGECTOMY AND REMOVAL RIGHT ECTOPIC PREG.  02-23-2004  . EXTRACORPOREAL SHOCK WAVE LITHOTRIPSY  2001  &  2002  . LAPAROSCOPIC CHOLECYSTECTOMY  1999  . POLYPECTOMY    . SHOULDER ARTHROSCOPY WITH OPEN ROTATOR CUFF REPAIR Right 2012  . TOTAL ABDOMINAL HYSTERECTOMY W/ BILATERAL OOPHORECTOMY AND LYSIS ADHESIONS  09-02-2007  . TUBAL LIGATION Bilateral 1995  . UMBILICAL HERNIA REPAIR  2003  . VAGINAL HYSTERECTOMY N/A 08/18/2015   Procedure: Exam under Anesthesia, Excision Xenform Graft, Removal Bilateral Sacrospinous Ligament sutures;  Surgeon: Nunzio Cobbs,  MD;  Location: [facility];  Service: Gynecology;  Laterality: N/A;   Social History   Socioeconomic History  . Marital status: Married    Spouse name: Not on file  . Number of children: Not on file  . Years of education: Not on file  . Highest education level: Not on file  Occupational History  . Not on file  Social Needs  .  Financial resource strain: Not on file  . Food insecurity    Worry: Not on file    Inability: Not on file  . Transportation needs    Medical: Not on file    Non-medical: Not on file  Tobacco Use  . Smoking status: Former Smoker    Years: 15.00    Types: Cigarettes    Quit date: 05/16/2008    Years since quitting: 11.4  . Smokeless tobacco: Never Used  Substance and Sexual Activity  . Alcohol use: No    Alcohol/week: 0.0 standard drinks  . Drug use: No  . Sexual activity: Yes    Partners: Male    Birth control/protection: Surgical    Comment: Hyst  Lifestyle  . Physical activity    Days per week: Not on file    Minutes per session: Not on file  . Stress: Not on file  Relationships  . Social Herbalist on phone: Not on file    Gets together: Not on file    Attends religious service: Not on file    Active member of club or organization: Not on file    Attends meetings of clubs or organizations: Not on file    Relationship status: Not on file  Other Topics Concern  . Not on file  Social History Narrative  . Not on file   Allergies  Allergen Reactions  . Compazine [Prochlorperazine]     Altered mental status  . Desvenlafaxine Other (See Comments)    Reaction:  Withdrawal   . Iodinated Diagnostic Agents Rash    Previously mis-entered as Iohexol allergy. Patient is not allergic to Non-Ionic contrast currently.   Family History  Problem Relation Age of Onset  . Hypertension Mother   . Colon polyps Mother   . Diabetes Father   . Colon polyps Father   . Cancer Paternal Uncle        Pancreas  . Pancreatic cancer Paternal Uncle   . Heart attack Maternal Grandmother   . Drug abuse Sister   . Esophageal cancer Neg Hx   . Stomach cancer Neg Hx   . Rectal cancer Neg Hx   . Colon cancer Neg Hx     Current Outpatient Medications (Endocrine & Metabolic):  .  predniSONE (DELTASONE) 10 MG tablet, 20 mg PO x daily x 5 days, 10 mg PO daily x 5 days    Current  Outpatient Medications (Analgesics):  .  acetaminophen (TYLENOL) 500 MG tablet, Take 1,000 mg by mouth every 6 (six) hours as needed for mild pain, moderate pain, fever or headache. Marland Kitchen  HYDROcodone-acetaminophen (NORCO/VICODIN) 5-325 MG tablet, Take 1 tablet by mouth every 8 (eight) hours. Marland Kitchen  ibuprofen (ADVIL) 200 MG tablet, Take 200 mg by mouth every 6 (six) hours as needed. .  meloxicam (MOBIC) 15 MG tablet, Take 1 tablet (15 mg total) by mouth daily. .  SUMAtriptan (IMITREX) 25 MG tablet, Take 1 tablet (25 mg total) by mouth every 2 (two) hours as needed for migraine. May repeat in 2 hours if headache persists or recurs.  Current Outpatient Medications (Other):  Marland Kitchen  ALPRAZolam (XANAX) 0.5 MG tablet, Take 1 tablet (0.5 mg total) by mouth at bedtime as needed for anxiety or sleep. .  busPIRone (BUSPAR) 5 MG tablet, Take 1 tablet (5 mg total) by mouth 2 (two) times daily. (Patient not taking: Reported on 09/28/2019) .  cyclobenzaprine (FLEXERIL) 10 MG tablet, Take 1 tablet (10 mg total) by mouth 3 (three) times daily as needed for muscle spasms. Marland Kitchen  FLUoxetine (PROZAC) 10 MG tablet, Take 1 tablet (10 mg total) by mouth daily. (Patient not taking: Reported on 09/28/2019) .  lidocaine (LIDODERM) 5 %, Place 1 patch onto the skin daily. Remove & Discard patch within 12 hours or as directed by MD .  Multiple Vitamin (MULTIVITAMIN WITH MINERALS) TABS tablet, Take 1 tablet by mouth daily.    Past medical history, social, surgical and family history all reviewed in electronic medical record.  No pertanent information unless stated regarding to the chief complaint.   Review of Systems:  No headache, visual changes, nausea, vomiting, diarrhea, constipation, dizziness, abdominal pain, skin rash, fevers, chills, night sweats, weight loss, swollen lymph nodes, body aches, joint swelling, muscle aches, chest pain, shortness of breath, mood changes.   Objective  Last menstrual period 12/24/2006. Systems  examined below as of    General: No apparent distress alert and oriented x3 mood and affect normal, dressed appropriately.  HEENT: Pupils equal, extraocular movements intact  Respiratory: Patient's speak in full sentences and does not appear short of breath  Cardiovascular: No lower extremity edema, non tender, no erythema  Skin: Warm dry intact with no signs of infection or rash on extremities or on axial skeleton.  Abdomen: Soft nontender  Neuro: Cranial nerves II through XII are intact, neurovascularly intact in all extremities with 2+ DTRs and 2+ pulses.  Lymph: No lymphadenopathy of posterior or anterior cervical chain or axillae bilaterally.  Gait normal with good balance and coordination.  MSK:  Non tender with full range of motion and good stability and symmetric strength and tone of shoulders, elbows, wrist, hip, knee and ankles bilaterally.     Impression and Recommendations:     This case required medical decision making of moderate complexity. The above documentation has been reviewed and is accurate and complete Lyndal Pulley, DO       Note: This dictation was prepared with Dragon dictation along with smaller phrase technology. Any transcriptional errors that result from this process are unintentional.

## 2019-10-16 ENCOUNTER — Ambulatory Visit (INDEPENDENT_AMBULATORY_CARE_PROVIDER_SITE_OTHER): Payer: 59 | Admitting: Internal Medicine

## 2019-10-16 VITALS — BP 121/85 | HR 73 | Temp 97.2°F

## 2019-10-16 DIAGNOSIS — F329 Major depressive disorder, single episode, unspecified: Secondary | ICD-10-CM

## 2019-10-16 DIAGNOSIS — R635 Abnormal weight gain: Secondary | ICD-10-CM

## 2019-10-16 DIAGNOSIS — F419 Anxiety disorder, unspecified: Secondary | ICD-10-CM

## 2019-10-16 DIAGNOSIS — F32A Depression, unspecified: Secondary | ICD-10-CM

## 2019-10-16 MED ORDER — SUMATRIPTAN SUCCINATE 25 MG PO TABS: 25.0000 mg | ORAL_TABLET | ORAL | 1 refills | Status: DC | PRN

## 2019-10-16 MED ORDER — NALTREXONE-BUPROPION HCL ER 8-90 MG PO TB12: ORAL_TABLET | ORAL | 0 refills | Status: DC

## 2019-10-16 NOTE — Progress Notes (Signed)
Subjective:    Patient ID: [name], female    DOB: 03-Nov-1966, 53 y.o.   MRN: [id number]  HPI  Pt presents to the clinic today wanting to discuss Contrave. She thinks it would help her to lose weight and with her depression. Her weight today is 237 lbs, with a BMI of 38.25. She stopped Fluoxetine a few months ago and reports her mood has been okay. She intermittently gets anxious, sad at times, but she feels like she deals with it pretty well. She rarely takes the Xanax. She reports her weight is a major stressor for her.  Breakfast: Bagel with cream cheese, cereal or eggs/sausage Lunch: Chicken salad kits, tuna kits Dinner: Chicken parmesean, wings or hamburgers, fries Snack: Cookies, chips  Exercise: None  Review of Systems      Past Medical History:  Diagnosis Date  . Allergy   . Anxiety   . Depression   . GERD (gastroesophageal reflux disease)   . History of abnormal cervical Pap smear    1991 -- 1995--hx cryotherapy to cervix  . History of colon polyps    2008- BENIGN  . Hydronephrosis, left   . Kidney stones 2016  . PONV (postoperative nausea and vomiting)     Current Outpatient Medications  Medication Sig Dispense Refill  . acetaminophen (TYLENOL) 500 MG tablet Take 1,000 mg by mouth every 6 (six) hours as needed for mild pain, moderate pain, fever or headache.    . ALPRAZolam (XANAX) 0.5 MG tablet Take 1 tablet (0.5 mg total) by mouth at bedtime as needed for anxiety or sleep. 20 tablet 1  . cyclobenzaprine (FLEXERIL) 10 MG tablet Take 1 tablet (10 mg total) by mouth 3 (three) times daily as needed for muscle spasms. 30 tablet 0  . HYDROcodone-acetaminophen (NORCO/VICODIN) 5-325 MG tablet Take 1 tablet by mouth every 8 (eight) hours. 6 tablet 0  . ibuprofen (ADVIL) 200 MG tablet Take 200 mg by mouth every 6 (six) hours as needed.    . meloxicam (MOBIC) 15 MG tablet Take 1 tablet (15 mg total) by mouth daily. 30 tablet 0  . Multiple Vitamin (MULTIVITAMIN WITH  MINERALS) TABS tablet Take 1 tablet by mouth daily.    . SUMAtriptan (IMITREX) 25 MG tablet Take 1 tablet (25 mg total) by mouth every 2 (two) hours as needed for migraine. May repeat in 2 hours if headache persists or recurs. 30 tablet 1  . Naltrexone-buPROPion HCl ER 8-90 MG TB12 Start 1 tablet every morning for 7 days, then 1 tablet twice daily for 7 days, then 2 tablets every morning and one in the evening 120 tablet 0   No current facility-administered medications for this visit.     Allergies  Allergen Reactions  . Compazine [Prochlorperazine]     Altered mental status  . Desvenlafaxine Other (See Comments)    Reaction:  Withdrawal   . Iodinated Diagnostic Agents Rash    Previously mis-entered as Iohexol allergy. Patient is not allergic to Non-Ionic contrast currently.    Family History  Problem Relation Age of Onset  . Hypertension Mother   . Colon polyps Mother   . Diabetes Father   . Colon polyps Father   . Cancer Paternal Uncle        Pancreas  . Pancreatic cancer Paternal Uncle   . Heart attack Maternal Grandmother   . Drug abuse Sister   . Esophageal cancer Neg Hx   . Stomach cancer Neg Hx   . Rectal cancer  Neg Hx   . Colon cancer Neg Hx     Social History   Socioeconomic History  . Marital status: Married    Spouse name: Not on file  . Number of children: Not on file  . Years of education: Not on file  . Highest education level: Not on file  Occupational History  . Not on file  Social Needs  . Financial resource strain: Not on file  . Food insecurity    Worry: Not on file    Inability: Not on file  . Transportation needs    Medical: Not on file    Non-medical: Not on file  Tobacco Use  . Smoking status: Former Smoker    Years: 15.00    Types: Cigarettes    Quit date: 05/16/2008    Years since quitting: 11.4  . Smokeless tobacco: Never Used  Substance and Sexual Activity  . Alcohol use: No    Alcohol/week: 0.0 standard drinks  . Drug use: No   . Sexual activity: Yes    Partners: Male    Birth control/protection: Surgical    Comment: Hyst  Lifestyle  . Physical activity    Days per week: Not on file    Minutes per session: Not on file  . Stress: Not on file  Relationships  . Social Herbalist on phone: Not on file    Gets together: Not on file    Attends religious service: Not on file    Active member of club or organization: Not on file    Attends meetings of clubs or organizations: Not on file    Relationship status: Not on file  . Intimate partner violence    Fear of current or ex partner: Not on file    Emotionally abused: Not on file    Physically abused: Not on file    Forced sexual activity: Not on file  Other Topics Concern  . Not on file  Social History Narrative  . Not on file     Constitutional: Pt reports weight gain. Denies fever, malaise, fatigue, headache.  Respiratory: Denies difficulty breathing, shortness of breath, cough or sputum production.   Cardiovascular: Denies chest pain, chest tightness, palpitations or swelling in the hands or feet.  Neurological: Denies dizziness, difficulty with memory, difficulty with speech or problems with balance and coordination.  Psych: Pt has a history of anxiety and depression. Denies SI/HI.  No other specific complaints in a complete review of systems (except as listed in HPI above).  Objective:   Physical Exam  BP 121/85   Pulse 73   Temp (!) 97.2 F (36.2 C)   LMP 12/24/2006 (Approximate)  Wt Readings from Last 3 Encounters:  09/28/19 107.5 kg  09/09/19 108 kg  05/17/19 108.9 kg    General: Appears her stated age, obese, in NAD. Cardiovascular: Normal rate and rhythm. S1,S2 noted.  No murmur, rubs or gallops noted. Pulmonary/Chest: Normal effort and positive vesicular breath sounds. No respiratory distress. No wheezes, rales or ronchi noted.  Neurological: Alert and oriented. Psychiatric: Mood and affect normal. Behavior is normal.  Judgment and thought content normal.     BMET    Component Value Date/Time   NA 139 04/01/2018 0857   K 4.6 04/01/2018 0857   CL 104 04/01/2018 0857   CO2 30 04/01/2018 0857   GLUCOSE 98 04/01/2018 0857   BUN 15 04/01/2018 0857   CREATININE 0.82 04/01/2018 0857   CALCIUM 9.2 04/01/2018 0857  GFRNONAA >60 11/15/2017 2142   GFRAA >60 11/15/2017 2142    Lipid Panel     Component Value Date/Time   CHOL 145 04/01/2018 0857   TRIG 130.0 04/01/2018 0857   HDL 35.90 (L) 04/01/2018 0857   CHOLHDL 4 04/01/2018 0857   VLDL 26.0 04/01/2018 0857   LDLCALC 83 04/01/2018 0857    CBC    Component Value Date/Time   WBC 5.6 04/01/2018 0857   RBC 4.52 04/01/2018 0857   HGB 13.2 04/01/2018 0857   HCT 38.5 04/01/2018 0857   PLT 237.0 04/01/2018 0857   MCV 85.2 04/01/2018 0857   MCH 28.9 11/15/2017 2142   MCHC 34.2 04/01/2018 0857   RDW 13.5 04/01/2018 0857   LYMPHSABS 3.0 05/31/2017 2332   MONOABS 0.7 05/31/2017 2332   EOSABS 0.1 05/31/2017 2332   BASOSABS 0.0 05/31/2017 2332    Hgb A1C Lab Results  Component Value Date   HGBA1C 5.8 03/18/2017            Assessment & Plan:   Abnormal Weight Gain:  Encouraged low-carb diet and exercise for weight loss Encouraged goal of 64 ounces of water daily Advised her to increase exercise, at least 30 minutes 2 to 3 days a week Will trial Contrave, Rx sent to pharmacy Will need to avoid narcotics while on Contrave  RTC in 1 month for weight check and med refill Webb Silversmith, NP

## 2019-10-23 NOTE — Assessment & Plan Note (Signed)
Controlled off meds Will trial Contrave for weight loss, may have a positive effect on mood Will monitor

## 2019-10-23 NOTE — Patient Instructions (Signed)
° °Calorie Counting for Weight Loss °Calories are units of energy. Your body needs a certain amount of calories from food to keep you going throughout the day. When you eat more calories than your body needs, your body stores the extra calories as fat. When you eat fewer calories than your body needs, your body burns fat to get the energy it needs. °Calorie counting means keeping track of how many calories you eat and drink each day. Calorie counting can be helpful if you need to lose weight. If you make sure to eat fewer calories than your body needs, you should lose weight. Ask your health care provider what a healthy weight is for you. °For calorie counting to work, you will need to eat the right number of calories in a day in order to lose a healthy amount of weight per week. A dietitian can help you determine how many calories you need in a day and will give you suggestions on how to reach your calorie goal. °· A healthy amount of weight to lose per week is usually 1-2 lb (0.5-0.9 kg). This usually means that your daily calorie intake should be reduced by 500-750 calories. °· Eating 1,200 - 1,500 calories per day can help most women lose weight. °· Eating 1,500 - 1,800 calories per day can help most men lose weight. °What is my plan? °My goal is to have __________ calories per day. °If I have this many calories per day, I should lose around __________ pounds per week. °What do I need to know about calorie counting? °In order to meet your daily calorie goal, you will need to: °· Find out how many calories are in each food you would like to eat. Try to do this before you eat. °· Decide how much of the food you plan to eat. °· Write down what you ate and how many calories it had. Doing this is called keeping a food log. °To successfully lose weight, it is important to balance calorie counting with a healthy lifestyle that includes regular activity. Aim for 150 minutes of moderate exercise (such as walking) or 75  minutes of vigorous exercise (such as running) each week. °Where do I find calorie information? ° °The number of calories in a food can be found on a Nutrition Facts label. If a food does not have a Nutrition Facts label, try to look up the calories online or ask your dietitian for help. °Remember that calories are listed per serving. If you choose to have more than one serving of a food, you will have to multiply the calories per serving by the amount of servings you plan to eat. For example, the label on a package of bread might say that a serving size is 1 slice and that there are 90 calories in a serving. If you eat 1 slice, you will have eaten 90 calories. If you eat 2 slices, you will have eaten 180 calories. °How do I keep a food log? °Immediately after each meal, record the following information in your food log: °· What you ate. Don't forget to include toppings, sauces, and other extras on the food. °· How much you ate. This can be measured in cups, ounces, or number of items. °· How many calories each food and drink had. °· The total number of calories in the meal. °Keep your food log near you, such as in a small notebook in your pocket, or use a mobile app or website. Some programs will   calculate calories for you and show you how many calories you have left for the day to meet your goal. °What are some calorie counting tips? ° °· Use your calories on foods and drinks that will fill you up and not leave you hungry: °? Some examples of foods that fill you up are nuts and nut butters, vegetables, lean proteins, and high-fiber foods like whole grains. High-fiber foods are foods with more than 5 g fiber per serving. °? Drinks such as sodas, specialty coffee drinks, alcohol, and juices have a lot of calories, yet do not fill you up. °· Eat nutritious foods and avoid empty calories. Empty calories are calories you get from foods or beverages that do not have many vitamins or protein, such as candy, sweets, and  soda. It is better to have a nutritious high-calorie food (such as an avocado) than a food with few nutrients (such as a bag of chips). °· Know how many calories are in the foods you eat most often. This will help you calculate calorie counts faster. °· Pay attention to calories in drinks. Low-calorie drinks include water and unsweetened drinks. °· Pay attention to nutrition labels for "low fat" or "fat free" foods. These foods sometimes have the same amount of calories or more calories than the full fat versions. They also often have added sugar, starch, or salt, to make up for flavor that was removed with the fat. °· Find a way of tracking calories that works for you. Get creative. Try different apps or programs if writing down calories does not work for you. °What are some portion control tips? °· Know how many calories are in a serving. This will help you know how many servings of a certain food you can have. °· Use a measuring cup to measure serving sizes. You could also try weighing out portions on a kitchen scale. With time, you will be able to estimate serving sizes for some foods. °· Take some time to put servings of different foods on your favorite plates, bowls, and cups so you know what a serving looks like. °· Try not to eat straight from a bag or box. Doing this can lead to overeating. Put the amount you would like to eat in a cup or on a plate to make sure you are eating the right portion. °· Use smaller plates, glasses, and bowls to prevent overeating. °· Try not to multitask (for example, watch TV or use your computer) while eating. If it is time to eat, sit down at a table and enjoy your food. This will help you to know when you are full. It will also help you to be aware of what you are eating and how much you are eating. °What are tips for following this plan? °Reading food labels °· Check the calorie count compared to the serving size. The serving size may be smaller than what you are used to  eating. °· Check the source of the calories. Make sure the food you are eating is high in vitamins and protein and low in saturated and trans fats. °Shopping °· Read nutrition labels while you shop. This will help you make healthy decisions before you decide to purchase your food. °· Make a grocery list and stick to it. °Cooking °· Try to cook your favorite foods in a healthier way. For example, try baking instead of frying. °· Use low-fat dairy products. °Meal planning °· Use more fruits and vegetables. Half of your plate should be   fruits and vegetables. °· Include lean proteins like poultry and fish. °How do I count calories when eating out? °· Ask for smaller portion sizes. °· Consider sharing an entree and sides instead of getting your own entree. °· If you get your own entree, eat only half. Ask for a box at the beginning of your meal and put the rest of your entree in it so you are not tempted to eat it. °· If calories are listed on the menu, choose the lower calorie options. °· Choose dishes that include vegetables, fruits, whole grains, low-fat dairy products, and lean protein. °· Choose items that are boiled, broiled, grilled, or steamed. Stay away from items that are buttered, battered, fried, or served with cream sauce. Items labeled "crispy" are usually fried, unless stated otherwise. °· Choose water, low-fat milk, unsweetened iced tea, or other drinks without added sugar. If you want an alcoholic beverage, choose a lower calorie option such as a glass of wine or light beer. °· Ask for dressings, sauces, and syrups on the side. These are usually high in calories, so you should limit the amount you eat. °· If you want a salad, choose a garden salad and ask for grilled meats. Avoid extra toppings like bacon, cheese, or fried items. Ask for the dressing on the side, or ask for olive oil and vinegar or lemon to use as dressing. °· Estimate how many servings of a food you are given. For example, a serving of  cooked rice is ½ cup or about the size of half a baseball. Knowing serving sizes will help you be aware of how much food you are eating at restaurants. The list below tells you how big or small some common portion sizes are based on everyday objects: °? 1 oz--4 stacked dice. °? 3 oz--1 deck of cards. °? 1 tsp--1 die. °? 1 Tbsp--½ a ping-pong ball. °? 2 Tbsp--1 ping-pong ball. °? ½ cup--½ baseball. °? 1 cup--1 baseball. °Summary °· Calorie counting means keeping track of how many calories you eat and drink each day. If you eat fewer calories than your body needs, you should lose weight. °· A healthy amount of weight to lose per week is usually 1-2 lb (0.5-0.9 kg). This usually means reducing your daily calorie intake by 500-750 calories. °· The number of calories in a food can be found on a Nutrition Facts label. If a food does not have a Nutrition Facts label, try to look up the calories online or ask your dietitian for help. °· Use your calories on foods and drinks that will fill you up, and not on foods and drinks that will leave you hungry. °· Use smaller plates, glasses, and bowls to prevent overeating. °This information is not intended to replace advice given to you by your health care provider. Make sure you discuss any questions you have with your health care provider. °Document Released: 12/10/2005 Document Revised: 08/29/2018 Document Reviewed: 11/09/2016 °Elsevier Patient Education © 2020 [employer] ° °

## 2019-10-27 ENCOUNTER — Encounter: Payer: Self-pay | Admitting: Internal Medicine

## 2019-11-09 ENCOUNTER — Ambulatory Visit: Payer: 59 | Admitting: Podiatry

## 2019-11-11 ENCOUNTER — Encounter: Payer: Self-pay | Admitting: Internal Medicine

## 2019-12-23 ENCOUNTER — Encounter: Payer: Self-pay | Admitting: Internal Medicine

## 2019-12-28 ENCOUNTER — Encounter: Payer: Self-pay | Admitting: Internal Medicine

## 2019-12-28 ENCOUNTER — Other Ambulatory Visit: Payer: Self-pay

## 2019-12-28 ENCOUNTER — Ambulatory Visit (INDEPENDENT_AMBULATORY_CARE_PROVIDER_SITE_OTHER): Payer: 59 | Admitting: Internal Medicine

## 2019-12-28 VITALS — BP 126/84 | HR 83 | Temp 98.0°F | Ht 65.5 in | Wt 239.0 lb

## 2019-12-28 DIAGNOSIS — Z79899 Other long term (current) drug therapy: Secondary | ICD-10-CM

## 2019-12-28 DIAGNOSIS — F419 Anxiety disorder, unspecified: Secondary | ICD-10-CM

## 2019-12-28 DIAGNOSIS — R21 Rash and other nonspecific skin eruption: Secondary | ICD-10-CM

## 2019-12-28 DIAGNOSIS — Z0000 Encounter for general adult medical examination without abnormal findings: Secondary | ICD-10-CM | POA: Diagnosis not present

## 2019-12-28 DIAGNOSIS — G43C1 Periodic headache syndromes in child or adult, intractable: Secondary | ICD-10-CM

## 2019-12-28 DIAGNOSIS — E559 Vitamin D deficiency, unspecified: Secondary | ICD-10-CM

## 2019-12-28 DIAGNOSIS — F32A Depression, unspecified: Secondary | ICD-10-CM

## 2019-12-28 DIAGNOSIS — Z0001 Encounter for general adult medical examination with abnormal findings: Secondary | ICD-10-CM | POA: Diagnosis not present

## 2019-12-28 DIAGNOSIS — E781 Pure hyperglyceridemia: Secondary | ICD-10-CM | POA: Diagnosis not present

## 2019-12-28 DIAGNOSIS — K219 Gastro-esophageal reflux disease without esophagitis: Secondary | ICD-10-CM

## 2019-12-28 DIAGNOSIS — F329 Major depressive disorder, single episode, unspecified: Secondary | ICD-10-CM

## 2019-12-28 DIAGNOSIS — N644 Mastodynia: Secondary | ICD-10-CM | POA: Diagnosis not present

## 2019-12-28 LAB — COMPREHENSIVE METABOLIC PANEL
ALT: 19 U/L (ref 0–35)
AST: 18 U/L (ref 0–37)
Albumin: 4 g/dL (ref 3.5–5.2)
Alkaline Phosphatase: 89 U/L (ref 39–117)
BUN: 9 mg/dL (ref 6–23)
CO2: 27 mEq/L (ref 19–32)
Calcium: 9.3 mg/dL (ref 8.4–10.5)
Chloride: 103 mEq/L (ref 96–112)
Creatinine, Ser: 0.7 mg/dL (ref 0.40–1.20)
GFR: 87.16 mL/min (ref >60.00)
Glucose, Bld: 111 mg/dL — ABNORMAL HIGH (ref 70–99)
Potassium: 3.7 mEq/L (ref 3.5–5.1)
Sodium: 139 mEq/L (ref 135–145)
Total Bilirubin: 0.3 mg/dL (ref 0.2–1.2)
Total Protein: 7.1 g/dL (ref 6.0–8.3)

## 2019-12-28 LAB — CBC
HCT: 39.4 % (ref 36.0–46.0)
Hemoglobin: 13.2 g/dL (ref 12.0–15.0)
MCHC: 33.5 g/dL (ref 30.0–36.0)
MCV: 87.3 fl (ref 78.0–100.0)
Platelets: 257 10*3/uL (ref 150.0–400.0)
RBC: 4.51 Mil/uL (ref 3.87–5.11)
RDW: 13.5 % (ref 11.5–15.5)
WBC: 6.1 10*3/uL (ref 4.0–10.5)

## 2019-12-28 LAB — LIPID PANEL
Cholesterol: 186 mg/dL (ref 0–200)
HDL: 43.1 mg/dL (ref >39.00)
NonHDL: 142.68
Total CHOL/HDL Ratio: 4
Triglycerides: 221 mg/dL — ABNORMAL HIGH (ref 0.0–149.0)
VLDL: 44.2 mg/dL — ABNORMAL HIGH (ref 0.0–40.0)

## 2019-12-28 LAB — HEMOGLOBIN A1C: Hgb A1c MFr Bld: 5.5 % (ref 4.6–6.5)

## 2019-12-28 LAB — VITAMIN D 25 HYDROXY (VIT D DEFICIENCY, FRACTURES): VITD: 20.7 ng/mL — ABNORMAL LOW (ref 30.00–100.00)

## 2019-12-28 LAB — LDL CHOLESTEROL, DIRECT: Direct LDL: 118 mg/dL

## 2019-12-28 MED ORDER — BUPROPION HCL ER (XL) 150 MG PO TB24: 150.0000 mg | ORAL_TABLET | Freq: Every day | ORAL | 2 refills | Status: DC

## 2019-12-28 MED ORDER — METRONIDAZOLE 1 % EX CREA: TOPICAL_CREAM | Freq: Every day | CUTANEOUS | 0 refills | Status: DC

## 2019-12-28 NOTE — Assessment & Plan Note (Signed)
Avoid foods that cause reflux Continue TUMS prn Will monitor

## 2019-12-28 NOTE — Assessment & Plan Note (Signed)
- 

## 2019-12-28 NOTE — Assessment & Plan Note (Signed)
CMET and Lipid profile today encouraged her to consume a low fat diet

## 2019-12-28 NOTE — Patient Instructions (Signed)

## 2019-12-28 NOTE — Progress Notes (Signed)
Subjective:    Patient ID: [name], female    DOB: 08-Mar-1966, 54 y.o.   MRN: [id number]  HPI  Pt presents to the clinic today for her annual exam. She is also due to follow up chronic conditions.  Anxiety and Depression: Deteriorated due to general life stress, working from home, taking care of her parents. Managed on Xanax prn. She stopped Fluoxetine secondary to weight gain and she felt like Bupropion was not as effective as it once was. She is not currently seeing a therapist. She denies SI/HI. CSA and UDS done 03/2018.  GERD: Triggered by ice cream and tomato based foods. She takes Tums as needed with good relief. Upper GI from 05/2007 reviewed.  HLD: Her last LDL was 83, 03/2018. She is not taking any cholesterol lowering medication at this time. She tries to consume a low fat diet.   Migraines: These occur 1-2 times per month. She takes Imitrex as needed with good relief of symptoms. She is not following with neurology.   She also c/o left breast pain. This started 1 week ago. She describes the pain as pulling, just underneath the left nipple. The area is tender to touch. She has not noticed any redness, changes in the skin, or nipple discharge. Her last mammogram was 03/2018.  Flu: 09/2019 Tetanus: 12/2013 Pap Smear: 12/2016, hysterectomy Mammogram: 03/2018 Colon Screening: 07/2017 Vision Screening: every 1-2 years Dentist: biannually  Diet: She does eat meat. She consumes fruits and veggies daily. She occasionally eats fried foods. She drinks mostly water, sugar free lemonade Exercise: None  Review of Systems      Past Medical History:  Diagnosis Date  . Allergy   . Anxiety   . Depression   . GERD (gastroesophageal reflux disease)   . History of abnormal cervical Pap smear    1991 -- 1995--hx cryotherapy to cervix  . History of colon polyps    2008- BENIGN  . Hydronephrosis, left   . Kidney stones 2016  . PONV (postoperative nausea and vomiting)     Current  Outpatient Medications  Medication Sig Dispense Refill  . acetaminophen (TYLENOL) 500 MG tablet Take 1,000 mg by mouth every 6 (six) hours as needed for mild pain, moderate pain, fever or headache.    . ALPRAZolam (XANAX) 0.5 MG tablet Take 1 tablet (0.5 mg total) by mouth at bedtime as needed for anxiety or sleep. 20 tablet 1  . cyclobenzaprine (FLEXERIL) 10 MG tablet Take 1 tablet (10 mg total) by mouth 3 (three) times daily as needed for muscle spasms. 30 tablet 0  . meloxicam (MOBIC) 15 MG tablet Take 1 tablet (15 mg total) by mouth daily. 30 tablet 0  . Multiple Vitamin (MULTIVITAMIN WITH MINERALS) TABS tablet Take 1 tablet by mouth daily.    . Naltrexone-buPROPion HCl ER 8-90 MG TB12 Start 1 tablet every morning for 7 days, then 1 tablet twice daily for 7 days, then 2 tablets every morning and one in the evening 120 tablet 0  . SUMAtriptan (IMITREX) 25 MG tablet Take 1 tablet (25 mg total) by mouth every 2 (two) hours as needed for migraine. May repeat in 2 hours if headache persists or recurs. 30 tablet 1   No current facility-administered medications for this visit.    Allergies  Allergen Reactions  . Compazine [Prochlorperazine]     Altered mental status  . Desvenlafaxine Other (See Comments)    Reaction:  Withdrawal   . Iodinated Diagnostic Agents Rash  Previously mis-entered as Iohexol allergy. Patient is not allergic to Non-Ionic contrast currently.    Family History  Problem Relation Age of Onset  . Hypertension Mother   . Colon polyps Mother   . Diabetes Father   . Colon polyps Father   . Cancer Paternal Uncle        Pancreas  . Pancreatic cancer Paternal Uncle   . Heart attack Maternal Grandmother   . Drug abuse Sister   . Esophageal cancer Neg Hx   . Stomach cancer Neg Hx   . Rectal cancer Neg Hx   . Colon cancer Neg Hx     Social History   Socioeconomic History  . Marital status: Married    Spouse name: Not on file  . Number of children: Not on file  .  Years of education: Not on file  . Highest education level: Not on file  Occupational History  . Not on file  Tobacco Use  . Smoking status: Former Smoker    Years: 15.00    Types: Cigarettes    Quit date: 05/16/2008    Years since quitting: 11.6  . Smokeless tobacco: Never Used  Substance and Sexual Activity  . Alcohol use: No    Alcohol/week: 0.0 standard drinks  . Drug use: No  . Sexual activity: Yes    Partners: Male    Birth control/protection: Surgical    Comment: Hyst  Other Topics Concern  . Not on file  Social History Narrative  . Not on file   Social Determinants of Health   Financial Resource Strain:   . Difficulty of Paying Living Expenses: Not on file  Food Insecurity:   . Worried About Charity fundraiser in the Last Year: Not on file  . Ran Out of Food in the Last Year: Not on file  Transportation Needs:   . Lack of Transportation (Medical): Not on file  . Lack of Transportation (Non-Medical): Not on file  Physical Activity:   . Days of Exercise per Week: Not on file  . Minutes of Exercise per Session: Not on file  Stress:   . Feeling of Stress : Not on file  Social Connections:   . Frequency of Communication with Friends and Family: Not on file  . Frequency of Social Gatherings with Friends and Family: Not on file  . Attends Religious Services: Not on file  . Active Member of Clubs or Organizations: Not on file  . Attends Archivist Meetings: Not on file  . Marital Status: Not on file  Intimate Partner Violence:   . Fear of Current or Ex-Partner: Not on file  . Emotionally Abused: Not on file  . Physically Abused: Not on file  . Sexually Abused: Not on file     Constitutional: Pt reports intermittent headaches. Denies fever, malaise, fatigue, or abrupt weight changes.  HEENT: Denies eye pain, eye redness, ear pain, ringing in the ears, wax buildup, runny nose, nasal congestion, bloody nose, or sore throat. Respiratory: Denies difficulty  breathing, shortness of breath, cough or sputum production.   Cardiovascular: Denies chest pain, chest tightness, palpitations or swelling in the hands or feet.  Gastrointestinal: Pt reports intermittent reflux. Denies abdominal pain, bloating, constipation, diarrhea or blood in the stool.  GU: Denies urgency, frequency, pain with urination, burning sensation, blood in urine, odor or discharge. Musculoskeletal: Denies decrease in range of motion, difficulty with gait, muscle pain or joint pain and swelling.  Skin: Pt reports rash of face,  left breast pain. Denies ulcercations.  Neurological: Denies dizziness, difficulty with memory, difficulty with speech or problems with balance and coordination.  Psych: Pt has a history of anxiety and depression. Denies SI/HI.  No other specific complaints in a complete review of systems (except as listed in HPI above).  Objective:   Physical Exam  BP 126/84   Pulse 83   Temp 98 F (36.7 C) (Temporal)   Ht 5' 5.5" (1.664 m)   Wt 239 lb (108.4 kg)   LMP 12/24/2006 (Approximate)   SpO2 97%   BMI 39.17 kg/m   Wt Readings from Last 3 Encounters:  09/28/19 237 lb (107.5 kg)  09/09/19 238 lb (108 kg)  05/17/19 240 lb (108.9 kg)    General: Appears her stated age, obese,  in NAD. Skin: Pustular rash noted on center of forehead, bilateral cheeks. Round lesion with scaly boarder and central clearing noted of left breast. HEENT: Head: normal shape and size; Eyes: sclera white, no icterus, conjunctiva pink, PERRLA and EOMs intact; Ears: Tm's gray and intact, normal light reflex;  Neck:  Neck supple, trachea midline. No masses, lumps or thyromegaly present.  Cardiovascular: Normal rate and rhythm. S1,S2 noted.  No murmur, rubs or gallops noted. No JVD or BLE edema. No carotid bruits noted. Pulmonary/Chest: Normal effort and positive vesicular breath sounds. No respiratory distress. No wheezes, rales or ronchi noted.  Breast: Symmetrical. Fibrocystic  changes noted in the left breast but no discrete mass. No discharge expressed from the nipple. No axillary lymphadenopathy. Abdomen: Soft and nontender. Normal bowel sounds. No distention or masses noted. Liver, spleen and kidneys non palpable. Musculoskeletal: Strength 5/5 BUE/BLE. No difficulty with gait.  Neurological: Alert and oriented. Cranial nerves II-XII grossly intact. Coordination normal.  Psychiatric: Mood and affect normal. Behavior is normal. Judgment and thought content normal.     BMET    Component Value Date/Time   NA 139 04/01/2018 0857   K 4.6 04/01/2018 0857   CL 104 04/01/2018 0857   CO2 30 04/01/2018 0857   GLUCOSE 98 04/01/2018 0857   BUN 15 04/01/2018 0857   CREATININE 0.82 04/01/2018 0857   CALCIUM 9.2 04/01/2018 0857   GFRNONAA >60 11/15/2017 2142   GFRAA >60 11/15/2017 2142    Lipid Panel     Component Value Date/Time   CHOL 145 04/01/2018 0857   TRIG 130.0 04/01/2018 0857   HDL 35.90 (L) 04/01/2018 0857   CHOLHDL 4 04/01/2018 0857   VLDL 26.0 04/01/2018 0857   LDLCALC 83 04/01/2018 0857    CBC    Component Value Date/Time   WBC 5.6 04/01/2018 0857   RBC 4.52 04/01/2018 0857   HGB 13.2 04/01/2018 0857   HCT 38.5 04/01/2018 0857   PLT 237.0 04/01/2018 0857   MCV 85.2 04/01/2018 0857   MCH 28.9 11/15/2017 2142   MCHC 34.2 04/01/2018 0857   RDW 13.5 04/01/2018 0857   LYMPHSABS 3.0 05/31/2017 2332   MONOABS 0.7 05/31/2017 2332   EOSABS 0.1 05/31/2017 2332   BASOSABS 0.0 05/31/2017 2332    Hgb A1C Lab Results  Component Value Date   HGBA1C 5.8 03/18/2017            Assessment & Plan:   Preventative Health Maintenance:  Flu shot UTD Tetanus UTD She no longer needs pap smears Mammogram ordered along with ultrasound, eval left breast pain Colon screening UTD Encouraged her to consume a balanced diet and exercise regimen Advised her to see an eye doctor and dentist  annually Will check CBC, CMET, Lipid, A1C and Vit D  today  Left Breast Pain:  Will obtain diagnostic mammogram and ultrasound of the left breast.  Rash of Face::  Will treat with Metronidazole cream for possible Rosacea Consider lupus workup if cream ineffective  RTC in 1 year, sooner if needed   Webb Silversmith, NP This visit occurred during the SARS-CoV-2 public health emergency.  Safety protocols were in place, including screening questions prior to the visit, additional usage of staff PPE, and extensive cleaning of exam room while observing appropriate contact time as indicated for disinfecting solutions.

## 2019-12-28 NOTE — Assessment & Plan Note (Signed)
Deteriorated Support offered today Will restart Wellbutrin 150 mg XL Xanax refilled today CSA and UDS updated today

## 2019-12-29 LAB — PAIN MGMT, PROFILE 8 W/CONF, U
6 Acetylmorphine: NEGATIVE ng/mL
Alcohol Metabolites: NEGATIVE ng/mL (ref <500)
Amphetamines: NEGATIVE ng/mL
Benzodiazepines: NEGATIVE ng/mL
Buprenorphine, Urine: NEGATIVE ng/mL
Cocaine Metabolite: NEGATIVE ng/mL
Creatinine: 77.8 mg/dL
MDMA: NEGATIVE ng/mL
Marijuana Metabolite: NEGATIVE ng/mL
Opiates: NEGATIVE ng/mL
Oxidant: NEGATIVE ug/mL
Oxycodone: NEGATIVE ng/mL
pH: 6.7 (ref 4.5–9.0)

## 2019-12-29 MED ORDER — VITAMIN D (ERGOCALCIFEROL) 1.25 MG (50000 UNIT) PO CAPS: 50000.0000 [IU] | ORAL_CAPSULE | ORAL | 0 refills | Status: DC

## 2019-12-29 NOTE — Addendum Note (Signed)
Addended by: Jearld Fenton on: 12/29/2019 01:44 PM   Modules accepted: Orders

## 2019-12-30 ENCOUNTER — Ambulatory Visit
Admission: RE | Admit: 2019-12-30 | Discharge: 2019-12-30 | Disposition: A | Discharge status: Home / Self Care | Payer: 59 | Source: Ambulatory Visit | Attending: Internal Medicine | Admitting: Internal Medicine

## 2019-12-30 ENCOUNTER — Other Ambulatory Visit: Payer: Self-pay | Admitting: Internal Medicine

## 2019-12-30 ENCOUNTER — Ambulatory Visit: Admission: RE | Admit: 2019-12-30 | Payer: 59 | Source: Ambulatory Visit

## 2019-12-30 ENCOUNTER — Encounter: Payer: Self-pay | Admitting: Internal Medicine

## 2019-12-30 ENCOUNTER — Other Ambulatory Visit: Payer: Self-pay

## 2019-12-30 DIAGNOSIS — N644 Mastodynia: Secondary | ICD-10-CM

## 2019-12-31 ENCOUNTER — Telehealth: Payer: Self-pay

## 2019-12-31 ENCOUNTER — Encounter: Payer: Self-pay | Admitting: Internal Medicine

## 2019-12-31 NOTE — Telephone Encounter (Signed)
PA has been submitted via [email or web address], awaiting response  °

## 2020-01-04 MED ORDER — METRONIDAZOLE 0.75 % EX CREA: TOPICAL_CREAM | Freq: Two times a day (BID) | CUTANEOUS | 0 refills | Status: DC

## 2020-01-22 ENCOUNTER — Encounter: Payer: Self-pay | Admitting: Internal Medicine

## 2020-01-22 DIAGNOSIS — R5382 Chronic fatigue, unspecified: Secondary | ICD-10-CM

## 2020-01-22 DIAGNOSIS — R21 Rash and other nonspecific skin eruption: Secondary | ICD-10-CM

## 2020-01-26 ENCOUNTER — Other Ambulatory Visit: Payer: Self-pay

## 2020-01-26 ENCOUNTER — Other Ambulatory Visit (INDEPENDENT_AMBULATORY_CARE_PROVIDER_SITE_OTHER): Payer: 59

## 2020-01-26 DIAGNOSIS — R7982 Elevated C-reactive protein (CRP): Secondary | ICD-10-CM

## 2020-01-26 DIAGNOSIS — R21 Rash and other nonspecific skin eruption: Secondary | ICD-10-CM | POA: Diagnosis not present

## 2020-01-26 DIAGNOSIS — R5382 Chronic fatigue, unspecified: Secondary | ICD-10-CM | POA: Diagnosis not present

## 2020-01-26 DIAGNOSIS — E559 Vitamin D deficiency, unspecified: Secondary | ICD-10-CM

## 2020-01-26 LAB — VITAMIN D 25 HYDROXY (VIT D DEFICIENCY, FRACTURES): VITD: 28.91 ng/mL — ABNORMAL LOW (ref 30.00–100.00)

## 2020-01-26 LAB — HIGH SENSITIVITY CRP: CRP, High Sensitivity: 9.11 mg/L — ABNORMAL HIGH (ref 0.000–5.000)

## 2020-01-26 LAB — SEDIMENTATION RATE: Sed Rate: 39 mm/hr — ABNORMAL HIGH (ref 0–30)

## 2020-01-27 ENCOUNTER — Encounter: Payer: Self-pay | Admitting: Internal Medicine

## 2020-01-28 ENCOUNTER — Encounter: Payer: Self-pay | Admitting: Internal Medicine

## 2020-01-28 LAB — ANA: Anti Nuclear Antibody (ANA): NEGATIVE

## 2020-01-28 LAB — C3 AND C4
C3 Complement: 180 mg/dL (ref 83–193)
C4 Complement: 28 mg/dL (ref 15–57)

## 2020-01-28 LAB — RHEUMATOID FACTOR: Rheumatoid fact SerPl-aCnc: <14 IU/mL (ref <14)

## 2020-01-28 LAB — CARDIOLIPIN ANTIBODY: Phospholipids: 203 mg/dL (ref 151–264)

## 2020-01-28 LAB — ANTI-DNA ANTIBODY, DOUBLE-STRANDED: ds DNA Ab: <1 IU/mL

## 2020-01-29 ENCOUNTER — Encounter: Payer: Self-pay | Admitting: Internal Medicine

## 2020-01-29 DIAGNOSIS — R21 Rash and other nonspecific skin eruption: Secondary | ICD-10-CM

## 2020-01-29 NOTE — Addendum Note (Signed)
Addended by: Jearld Fenton on: 01/29/2020 12:16 PM   Modules accepted: Orders

## 2020-02-03 ENCOUNTER — Telehealth: Payer: 59 | Admitting: Nurse Practitioner

## 2020-02-03 DIAGNOSIS — J029 Acute pharyngitis, unspecified: Secondary | ICD-10-CM

## 2020-02-03 NOTE — Progress Notes (Signed)
We are sorry that you are not feeling well.  Here is how we plan to help!  Your symptoms indicate a likely viral infection (Pharyngitis).   Pharyngitis is inflammation in the back of the throat which can cause a sore throat, scratchiness and sometimes difficulty swallowing.   Pharyngitis is typically caused by a respiratory virus and will just run its course.  Please keep in mind that your symptoms could last up to 10 days.  For throat pain, we recommend over the counter oral pain relief medications such as acetaminophen or aspirin, or anti-inflammatory medications such as ibuprofen or naproxen sodium.  Topical treatments such as oral throat lozenges or sprays may be used as needed.  Avoid close contact with loved ones, especially the very young and elderly.  Remember to wash your hands thoroughly throughout the day as this is the number one way to prevent the spread of infection and wipe down door knobs and counters with disinfectant.  After careful review of your answers, I would not recommend and antibiotic for your condition.  Antibiotics should not be used to treat conditions that we suspect are caused by viruses like the virus that causes the common cold or flu. However, some people can have Strep with atypical symptoms. You may need formal testing in clinic or office to confirm if your symptoms continue or worsen.  Providers prescribe antibiotics to treat infections caused by bacteria. Antibiotics are very powerful in treating bacterial infections when they are used properly.  To maintain their effectiveness, they should be used only when necessary.  Overuse of antibiotics has resulted in the development of super bugs that are resistant to treatment!    Home Care:  Only take medications as instructed by your medical team.  Do not drink alcohol while taking these medications.  A steam or ultrasonic humidifier can help congestion.  You can place a towel over your head and breathe in the steam from  hot water coming from a faucet.  Avoid close contacts especially the very young and the elderly.  Cover your mouth when you cough or sneeze.  Always remember to wash your hands.  Get Help Right Away If:  You develop worsening fever or throat pain.  You develop a severe head ache or visual changes.  Your symptoms persist after you have completed your treatment plan.  Make sure you  Understand these instructions.  Will watch your condition.  Will get help right away if you are not doing well or get worse.  Your e-visit answers were reviewed by a board certified advanced clinical practitioner to complete your personal care plan.  Depending on the condition, your plan could have included both over the counter or prescription medications.  If there is a problem please reply  once you have received a response from your provider.  Your safety is important to us.  If you have drug allergies check your prescription carefully.    You can use MyChart to ask questions about todays visit, request a non-urgent call back, or ask for a work or school excuse for 24 hours related to this e-Visit. If it has been greater than 24 hours you will need to follow up with your provider, or enter a new e-Visit to address those concerns.  You will get an e-mail in the next two days asking about your experience.  I hope that your e-visit has been valuable and will speed your recovery. Thank you for using e-visits.   5-10 minutes spent reviewing   and documenting in chart.  

## 2020-02-09 ENCOUNTER — Other Ambulatory Visit (INDEPENDENT_AMBULATORY_CARE_PROVIDER_SITE_OTHER): Payer: 59

## 2020-02-09 ENCOUNTER — Encounter: Payer: Self-pay | Admitting: Internal Medicine

## 2020-02-09 ENCOUNTER — Other Ambulatory Visit: Payer: Self-pay

## 2020-02-09 DIAGNOSIS — R7982 Elevated C-reactive protein (CRP): Secondary | ICD-10-CM

## 2020-02-09 LAB — HIGH SENSITIVITY CRP: CRP, High Sensitivity: 5.88 mg/L — ABNORMAL HIGH (ref 0.000–5.000)

## 2020-02-25 ENCOUNTER — Encounter: Payer: Self-pay | Admitting: Internal Medicine

## 2020-02-29 ENCOUNTER — Encounter: Payer: Self-pay | Admitting: Internal Medicine

## 2020-02-29 MED ORDER — ALPRAZOLAM 0.5 MG PO TABS: 0.5000 mg | ORAL_TABLET | Freq: Every evening | ORAL | 0 refills | Status: DC | PRN

## 2020-03-31 ENCOUNTER — Encounter: Payer: Self-pay | Admitting: Internal Medicine

## 2020-03-31 NOTE — Telephone Encounter (Signed)
Please have her schedule an in office visit for further evaluation

## 2020-04-04 ENCOUNTER — Encounter: Payer: Self-pay | Admitting: Internal Medicine

## 2020-04-04 ENCOUNTER — Other Ambulatory Visit: Payer: Self-pay

## 2020-04-04 ENCOUNTER — Ambulatory Visit (INDEPENDENT_AMBULATORY_CARE_PROVIDER_SITE_OTHER): Payer: 59 | Admitting: Internal Medicine

## 2020-04-04 VITALS — BP 128/84 | HR 74 | Temp 98.0°F | Wt 241.0 lb

## 2020-04-04 DIAGNOSIS — M25541 Pain in joints of right hand: Secondary | ICD-10-CM | POA: Diagnosis not present

## 2020-04-04 DIAGNOSIS — R7982 Elevated C-reactive protein (CRP): Secondary | ICD-10-CM

## 2020-04-04 DIAGNOSIS — R609 Edema, unspecified: Secondary | ICD-10-CM | POA: Diagnosis not present

## 2020-04-04 DIAGNOSIS — R600 Localized edema: Secondary | ICD-10-CM

## 2020-04-04 DIAGNOSIS — E559 Vitamin D deficiency, unspecified: Secondary | ICD-10-CM

## 2020-04-04 DIAGNOSIS — M25542 Pain in joints of left hand: Secondary | ICD-10-CM

## 2020-04-04 NOTE — Patient Instructions (Signed)
Joint Pain  Joint pain can be caused by many things. It is likely to go away if you follow instructions from your doctor for taking care of yourself at home. Sometimes, you may need more treatment. Follow these instructions at home: Managing pain, stiffness, and swelling   If told, put ice on the painful area. ? Put ice in a plastic bag. ? Place a towel between your skin and the bag. ? Leave the ice on for 20 minutes, 2-3 times a day.  If told, put heat on the painful area. Do this as often as told by your doctor. Use the heat source that your doctor recommends, such as a moist heat pack or a heating pad. ? Place a towel between your skin and the heat source. ? Leave the heat on for 20-30 minutes. ? Take off the heat if your skin gets bright red. This is especially important if you are unable to feel pain, heat, or cold. You may have a greater risk of getting burned.  Move your fingers or toes below the painful joint often. This helps with stiffness and swelling.  If possible, raise (elevate) the painful joint above the level of your heart while you are sitting or lying down. To do this, try putting a few pillows under the painful joint. Activity  Rest the painful joint for as long as told. Do not do things that cause pain or make your pain worse.  Begin exercising or stretching the affected area, as told by your doctor. Ask your doctor what types of exercise are safe for you. If you have an elastic bandage, sling, or splint:  Wear the device as told by your doctor. Take it off only as told by your doctor.  Loosen the device if your fingers or toes below the joint: ? Tingle. ? Lose feeling (get numb). ? Get cold and blue.  Keep the device clean.  Ask your doctor if you should take off the device before bathing. You may need to cover it with a watertight covering when you take a bath or a shower. General instructions  Take over-the-counter and prescription medicines only as told  by your doctor.  Do not use any products that contain nicotine or tobacco. These include cigarettes and e-cigarettes. If you need help quitting, ask your doctor.  Keep all follow-up visits as told by your doctor. This is important. Contact a doctor if:  You have pain that gets worse and does not get better with medicine.  Your joint pain does not get better in 3 days.  You have more bruising or swelling.  You have a fever.  You lose 10 lb (4.5 kg) or more without trying. Get help right away if:  You cannot move the joint.  Your fingers or toes tingle, lose feeling, or get cold and blue.  You have a fever along with a joint that is red, warm, and swollen. Summary  Joint pain can be caused by many things. It often goes away if you follow instructions from your doctor for taking care of yourself at home.  Rest the painful joint for as long as told. Do not do things that cause pain or make your pain worse.  Take over-the-counter and prescription medicines only as told by your doctor. This information is not intended to replace advice given to you by your health care provider. Make sure you discuss any questions you have with your health care provider. Document Revised: 11/22/2017 Document Reviewed: 09/25/2017 Elsevier  Patient Education  [employer].

## 2020-04-04 NOTE — Progress Notes (Signed)
Subjective:    Patient ID: [name], female    DOB: 07-Nov-1966, 54 y.o.   MRN: [id number]  HPI  Pt presents to the clinic today with c/o swelling in her hands and feet. She noticed this swelling in her right leg last Wednesday. This improved with Ibuprofen and elevation. Since then, she has noticed swelling in her hands that got so tight it was painful. She reports intermittent swelling in different parts of her body at various times. She has no idea what is causing her to swell.  She also reports constant joint pain. She has had a negative autoimmune workup. She has tried Ibuprofen with minimal relief. She has failed Meloxicam in the past.  She would also like her Vit D repeated. She just finished 12 weeks of Ergocalciferol.  Review of Systems      Past Medical History:  Diagnosis Date  . Allergy   . Anxiety   . Depression   . GERD (gastroesophageal reflux disease)   . History of abnormal cervical Pap smear    1991 -- 1995--hx cryotherapy to cervix  . History of colon polyps    2008- BENIGN  . Hydronephrosis, left   . Kidney stones 2016  . PONV (postoperative nausea and vomiting)     Current Outpatient Medications  Medication Sig Dispense Refill  . acetaminophen (TYLENOL) 500 MG tablet Take 1,000 mg by mouth every 6 (six) hours as needed for mild pain, moderate pain, fever or headache.    . ALPRAZolam (XANAX) 0.5 MG tablet Take 1 tablet (0.5 mg total) by mouth at bedtime as needed for anxiety or sleep. 20 tablet 0  . buPROPion (WELLBUTRIN XL) 150 MG 24 hr tablet Take 1 tablet (150 mg total) by mouth daily. 30 tablet 2  . meloxicam (MOBIC) 15 MG tablet Take 1 tablet (15 mg total) by mouth daily. 30 tablet 0  . metroNIDAZOLE (METROCREAM) 0.75 % cream Apply topically 2 (two) times daily. 45 g 0  . Multiple Vitamin (MULTIVITAMIN WITH MINERALS) TABS tablet Take 1 tablet by mouth daily.    . SUMAtriptan (IMITREX) 25 MG tablet Take 1 tablet (25 mg total) by mouth  every 2 (two) hours as needed for migraine. May repeat in 2 hours if headache persists or recurs. 30 tablet 1  . Vitamin D, Ergocalciferol, (DRISDOL) 1.25 MG (50000 UT) CAPS capsule Take 1 capsule (50,000 Units total) by mouth every 7 (seven) days. 12 capsule 0   No current facility-administered medications for this visit.    Allergies  Allergen Reactions  . Compazine [Prochlorperazine]     Altered mental status  . Desvenlafaxine Other (See Comments)    Reaction:  Withdrawal   . Iodinated Diagnostic Agents Rash    Previously mis-entered as Iohexol allergy. Patient is not allergic to Non-Ionic contrast currently.    Family History  Problem Relation Age of Onset  . Hypertension Mother   . Colon polyps Mother   . Diabetes Father   . Colon polyps Father   . Cancer Paternal Uncle        Pancreas  . Pancreatic cancer Paternal Uncle   . Heart attack Maternal Grandmother   . Drug abuse Sister   . Breast cancer Paternal Grandmother   . Esophageal cancer Neg Hx   . Stomach cancer Neg Hx   . Rectal cancer Neg Hx   . Colon cancer Neg Hx     Social History   Socioeconomic History  . Marital status: Married  Spouse name: Not on file  . Number of children: Not on file  . Years of education: Not on file  . Highest education level: Not on file  Occupational History  . Not on file  Tobacco Use  . Smoking status: Former Smoker    Years: 15.00    Types: Cigarettes    Quit date: 05/16/2008    Years since quitting: 11.8  . Smokeless tobacco: Never Used  Substance and Sexual Activity  . Alcohol use: No    Alcohol/week: 0.0 standard drinks  . Drug use: No  . Sexual activity: Yes    Partners: Male    Birth control/protection: Surgical    Comment: Hyst  Other Topics Concern  . Not on file  Social History Narrative  . Not on file   Social Determinants of Health   Financial Resource Strain:   . Difficulty of Paying Living Expenses:   Food Insecurity:   . Worried About  Charity fundraiser in the Last Year:   . Arboriculturist in the Last Year:   Transportation Needs:   . Film/video editor (Medical):   Marland Kitchen Lack of Transportation (Non-Medical):   Physical Activity:   . Days of Exercise per Week:   . Minutes of Exercise per Session:   Stress:   . Feeling of Stress :   Social Connections:   . Frequency of Communication with Friends and Family:   . Frequency of Social Gatherings with Friends and Family:   . Attends Religious Services:   . Active Member of Clubs or Organizations:   . Attends Archivist Meetings:   Marland Kitchen Marital Status:   Intimate Partner Violence:   . Fear of Current or Ex-Partner:   . Emotionally Abused:   Marland Kitchen Physically Abused:   . Sexually Abused:      Constitutional: Denies fever, malaise, fatigue, headache or abrupt weight changes.  Respiratory: Denies difficulty breathing, shortness of breath, cough or sputum production.   Cardiovascular: Pt reports swelling in her hands and feet. Denies chest pain, chest tightness, palpitations.  Musculoskeletal: Pt reports joint pain in hands. Denies decrease in range of motion, difficulty with gait, muscle pain.  Skin: Denies redness, rashes, lesions or ulcercations.   No other specific complaints in a complete review of systems (except as listed in HPI above).  Objective:   Physical Exam BP 128/84   Pulse 74   Temp 98 F (36.7 C) (Temporal)   Wt 241 lb (109.3 kg)   LMP 12/24/2006 (Approximate)   SpO2 97%   BMI 39.49 kg/m   Wt Readings from Last 3 Encounters:  12/28/19 239 lb (108.4 kg)  09/28/19 237 lb (107.5 kg)  09/09/19 238 lb (108 kg)    General: Appears her stated age, obese, in NAD. Skin: Warm, dry and intact. No rashes noted. Cardiovascular: Normal rate and rhythm. S1,S2 noted.  No murmur, rubs or gallops noted. Trace BLE edema, R>L/ Pulmonary/Chest: Normal effort and positive vesicular breath sounds. No respiratory distress. No wheezes, rales or ronchi noted.   Musculoskeletal:  No signs of joint swelling. No difficulty with gait.  Neurological: Alert and oriented.    BMET    Component Value Date/Time   NA 139 12/28/2019 1323   K 3.7 12/28/2019 1323   CL 103 12/28/2019 1323   CO2 27 12/28/2019 1323   GLUCOSE 111 (H) 12/28/2019 1323   BUN 9 12/28/2019 1323   CREATININE 0.70 12/28/2019 1323   CALCIUM 9.3 12/28/2019 1323  GFRNONAA >60 11/15/2017 2142   GFRAA >60 11/15/2017 2142    Lipid Panel     Component Value Date/Time   CHOL 186 12/28/2019 1323   TRIG 221.0 (H) 12/28/2019 1323   HDL 43.10 12/28/2019 1323   CHOLHDL 4 12/28/2019 1323   VLDL 44.2 (H) 12/28/2019 1323   LDLCALC 83 04/01/2018 0857    CBC    Component Value Date/Time   WBC 6.1 12/28/2019 1323   RBC 4.51 12/28/2019 1323   HGB 13.2 12/28/2019 1323   HCT 39.4 12/28/2019 1323   PLT 257.0 12/28/2019 1323   MCV 87.3 12/28/2019 1323   MCH 28.9 11/15/2017 2142   MCHC 33.5 12/28/2019 1323   RDW 13.5 12/28/2019 1323   LYMPHSABS 3.0 05/31/2017 2332   MONOABS 0.7 05/31/2017 2332   EOSABS 0.1 05/31/2017 2332   BASOSABS 0.0 05/31/2017 2332    Hgb A1C Lab Results  Component Value Date   HGBA1C 5.5 12/28/2019             Assessment & Plan:   Joint Pain in Hands, Peripheral Edema, Elevated CRP:  Will repeat CRP Discussed adding HCTX, Celebrex but will have her see rheumatology for further evaluation first Encouraged her to monitor salt intake  Vit D Deficiency:  Repeat Vit D today  Will follow up after labs are back, rheumatology evaluation   Webb Silversmith, NP This visit occurred during the SARS-CoV-2 public health emergency.  Safety protocols were in place, including screening questions prior to the visit, additional usage of staff PPE, and extensive cleaning of exam room while observing appropriate contact time as indicated for disinfecting solutions.

## 2020-04-05 ENCOUNTER — Encounter: Payer: Self-pay | Admitting: Internal Medicine

## 2020-04-05 LAB — VITAMIN D 25 HYDROXY (VIT D DEFICIENCY, FRACTURES): VITD: 37.16 ng/mL (ref 30.00–100.00)

## 2020-04-05 LAB — HIGH SENSITIVITY CRP: CRP, High Sensitivity: 6.26 mg/L — ABNORMAL HIGH (ref 0.000–5.000)

## 2020-04-21 ENCOUNTER — Encounter: Payer: Self-pay | Admitting: Internal Medicine

## 2020-05-20 ENCOUNTER — Encounter: Payer: Self-pay | Admitting: Internal Medicine

## 2020-06-19 ENCOUNTER — Ambulatory Visit (HOSPITAL_COMMUNITY)
Admission: EM | Admit: 2020-06-19 | Discharge: 2020-06-19 | Disposition: A | Discharge status: Home / Self Care | Payer: 59 | Attending: Family Medicine | Admitting: Family Medicine

## 2020-06-19 ENCOUNTER — Encounter (HOSPITAL_COMMUNITY): Payer: Self-pay

## 2020-06-19 ENCOUNTER — Other Ambulatory Visit: Payer: Self-pay

## 2020-06-19 DIAGNOSIS — J029 Acute pharyngitis, unspecified: Secondary | ICD-10-CM

## 2020-06-19 DIAGNOSIS — R5383 Other fatigue: Secondary | ICD-10-CM

## 2020-06-19 DIAGNOSIS — R0981 Nasal congestion: Secondary | ICD-10-CM | POA: Diagnosis not present

## 2020-06-19 DIAGNOSIS — J3089 Other allergic rhinitis: Secondary | ICD-10-CM

## 2020-06-19 MED ORDER — FLUTICASONE PROPIONATE 50 MCG/ACT NA SUSP
2.0000 | Freq: Every day | NASAL | 2 refills | Status: DC
Start: 2020-06-19 — End: 2020-10-20

## 2020-06-19 MED ORDER — CETIRIZINE-PSEUDOEPHEDRINE ER 5-120 MG PO TB12
1.0000 | ORAL_TABLET | Freq: Every day | ORAL | 0 refills | Status: DC
Start: 2020-06-19 — End: 2020-10-20

## 2020-06-19 NOTE — Discharge Instructions (Signed)
I sent Flonase and Zyrtec-D to pharmacy  I feel like this is most likely coming from either a cold or allergies  If you develop a fever, noticed any weakness, chest pain, other concerning symptoms you may follow-up with this office or with primary care as needed.  Follow-up with the ER for trouble swallowing, trouble breathing, other concerning symptoms.

## 2020-06-19 NOTE — ED Triage Notes (Signed)
Pt present sore throat, symptoms started on Friday. Pt states that when she swallows it feels like she is cutting her throat with glass.

## 2020-06-19 NOTE — ED Provider Notes (Signed)
[place]   [id number] 06/19/20 Arrival Time: 1218  LT:JQZE THROAT  SUBJECTIVE: History from: patient.  [name] is a 54 y.o. female who presents with abrupt onset of sore throat, nasal congestion, fatigue for 3 days. Denies to sick exposure to Covid, strep, flu or mono, or precipitating event. Has tried no OTC medications for this. Symptoms are made worse with swallowing, but tolerating liquids and own secretions without difficulty. Denies previous symptoms in the past.     Denies fever, chills, ear pain, sinus pain, rhinorrhea, cough, SOB, wheezing, chest pain, nausea, rash, changes in bowel or bladder habits.     ROS: As per HPI.  All other pertinent ROS negative.     Past Medical History:  Diagnosis Date  . Allergy   . Anxiety   . Depression   . GERD (gastroesophageal reflux disease)   . History of abnormal cervical Pap smear    1991 -- 1995--hx cryotherapy to cervix  . History of colon polyps    2008- BENIGN  . Hydronephrosis, left   . Kidney stones 2016  . PONV (postoperative nausea and vomiting)    Past Surgical History:  Procedure Laterality Date  . ABDOMINAL HYSTERECTOMY    . ANTERIOR AND POSTERIOR REPAIR WITH SACROSPINOUS FIXATION N/A 08/16/2015   Procedure: ANTERIOR COLPORRHAPHY WITH XENOFORM GRAFT AND SACROSPINOUS FIXATION;  Surgeon: Nunzio Cobbs, MD;  Location: [facility];  Service: Gynecology;  Laterality: N/A;  2.5 hours OR time  . BLADDER SUSPENSION N/A 08/16/2015   Procedure: TRANSVAGINAL TAPE (TVT) PROCEDURE exact midurethral sling;  Surgeon: Nunzio Cobbs, MD;  Location: [facility];  Service: Gynecology;  Laterality: N/A;  . COLONOSCOPY    . COLONOSCOPY W/ POLYPECTOMY  05-30-2007  . CYSTOSCOPY N/A 08/16/2015   Procedure: CYSTOSCOPY;  Surgeon: Nunzio Cobbs, MD;  Location: [facility];  Service: Gynecology;  Laterality: N/A;  . CYSTOSCOPY W/ RETROGRADES Bilateral 06/22/2015   Procedure: CYSTOSCOPY WITH RETROGRADE  PYELOGRAM;  Surgeon: Cleon Gustin, MD;  Location: [facility];  Service: Urology;  Laterality: Bilateral;  . CYSTOSCOPY W/ URETERAL STENT PLACEMENT  02/  2000  . CYSTOSCOPY WITH HOLMIUM LASER LITHOTRIPSY Left 05/18/2015   Procedure: CYSTOSCOPY WITH HOLMIUM LASER LITHOTRIPSY;  Surgeon: Alexis Frock, MD;  Location: [facility];  Service: Urology;  Laterality: Left;  . CYSTOSCOPY WITH RETROGRADE PYELOGRAM, URETEROSCOPY AND STENT PLACEMENT Left 06/22/2015   Procedure: CYSTOSCOPY,  LEFT URETEROSCOPY;  Surgeon: Cleon Gustin, MD;  Location: [facility];  Service: Urology;  Laterality: Left;  . CYSTOSCOPY WITH URETEROSCOPY AND STENT PLACEMENT Left 05/18/2015   Procedure: CYSTOSCOPY WITH URETEROSCOPY, STONE MANIPULATION AND STENT PLACEMENT;  Surgeon: Alexis Frock, MD;  Location: [facility];  Service: Urology;  Laterality: Left;  . DIAGNOSTIC LAPAROSCOPY    . DX LAPAROSCOPY  X2  . ESOPHAGOGASTRODUODENOSCOPY  05-09-2007  . EXPLORATORY LAPAROTOMY W/ BILATERAL SALPINGECTOMY AND REMOVAL RIGHT ECTOPIC PREG.  02-23-2004  . EXTRACORPOREAL SHOCK WAVE LITHOTRIPSY  2001  &  2002  . LAPAROSCOPIC CHOLECYSTECTOMY  1999  . POLYPECTOMY    . SHOULDER ARTHROSCOPY WITH OPEN ROTATOR CUFF REPAIR Right 2012  . TOTAL ABDOMINAL HYSTERECTOMY W/ BILATERAL OOPHORECTOMY AND LYSIS ADHESIONS  09-02-2007  . TUBAL LIGATION Bilateral 1995  . UMBILICAL HERNIA REPAIR  2003  . VAGINAL HYSTERECTOMY N/A 08/18/2015   Procedure: Exam under Anesthesia, Excision Xenform Graft, Removal Bilateral Sacrospinous Ligament sutures;  Surgeon: Nunzio Cobbs, MD;  Location: [facility];  Service: Gynecology;  Laterality: N/A;   Allergies  Allergen Reactions  . Compazine [Prochlorperazine]     Altered mental status  . Desvenlafaxine Other (See Comments)    Reaction:  Withdrawal   . Iodinated Diagnostic Agents Rash    Previously mis-entered as Iohexol allergy. Patient is  not allergic to Non-Ionic contrast currently.   No current facility-administered medications on file prior to encounter.   Current Outpatient Medications on File Prior to Encounter  Medication Sig Dispense Refill  . ALPRAZolam (XANAX) 0.5 MG tablet Take 1 tablet (0.5 mg total) by mouth at bedtime as needed for anxiety or sleep. 20 tablet 0  . buPROPion (WELLBUTRIN XL) 150 MG 24 hr tablet Take 1 tablet (150 mg total) by mouth daily. 30 tablet 2  . doxycycline (VIBRAMYCIN) 100 MG capsule Take 100 mg by mouth daily.    . metroNIDAZOLE (METROCREAM) 0.75 % cream Apply topically 2 (two) times daily.    . Multiple Vitamin (MULTIVITAMIN WITH MINERALS) TABS tablet Take 1 tablet by mouth daily.    . SUMAtriptan (IMITREX) 25 MG tablet Take 1 tablet (25 mg total) by mouth every 2 (two) hours as needed for migraine. May repeat in 2 hours if headache persists or recurs. 30 tablet 1   Social History   Socioeconomic History  . Marital status: Married    Spouse name: Not on file  . Number of children: Not on file  . Years of education: Not on file  . Highest education level: Not on file  Occupational History  . Not on file  Tobacco Use  . Smoking status: Former Smoker    Years: 15.00    Types: Cigarettes    Quit date: 05/16/2008    Years since quitting: 12.1  . Smokeless tobacco: Never Used  Vaping Use  . Vaping Use: Never used  Substance and Sexual Activity  . Alcohol use: No    Alcohol/week: 0.0 standard drinks  . Drug use: No  . Sexual activity: Yes    Partners: Male    Birth control/protection: Surgical    Comment: Hyst  Other Topics Concern  . Not on file  Social History Narrative  . Not on file   Social Determinants of Health   Financial Resource Strain:   . Difficulty of Paying Living Expenses:   Food Insecurity:   . Worried About Charity fundraiser in the Last Year:   . Arboriculturist in the Last Year:   Transportation Needs:   . Film/video editor (Medical):   Marland Kitchen  Lack of Transportation (Non-Medical):   Physical Activity:   . Days of Exercise per Week:   . Minutes of Exercise per Session:   Stress:   . Feeling of Stress :   Social Connections:   . Frequency of Communication with Friends and Family:   . Frequency of Social Gatherings with Friends and Family:   . Attends Religious Services:   . Active Member of Clubs or Organizations:   . Attends Archivist Meetings:   Marland Kitchen Marital Status:   Intimate Partner Violence:   . Fear of Current or Ex-Partner:   . Emotionally Abused:   Marland Kitchen Physically Abused:   . Sexually Abused:    Family History  Problem Relation Age of Onset  . Hypertension Mother   . Colon polyps Mother   . Diabetes Father   . Colon polyps Father   . Cancer Paternal Uncle  Pancreas  . Pancreatic cancer Paternal Uncle   . Heart attack Maternal Grandmother   . Drug abuse Sister   . Breast cancer Paternal Grandmother   . Esophageal cancer Neg Hx   . Stomach cancer Neg Hx   . Rectal cancer Neg Hx   . Colon cancer Neg Hx     OBJECTIVE:  Vitals:   06/19/20 1253  BP: 130/83  Pulse: 69  Resp: 18  Temp: 98.4 F (36.9 C)  TempSrc: Oral  SpO2: 99%     General appearance: alert; appears fatigued, but nontoxic, speaking in full sentences and managing own secretions HEENT: NCAT; Ears: EACs clear, TMs pearly gray with visible cone of light, without erythema; Eyes: PERRL, EOMI grossly; Nose: no obvious rhinorrhea; Throat: oropharynx clear, tonsils 1+ and mildly erythematous without white tonsillar exudates, uvula midline Neck: supple without LAD Lungs: CTA bilaterally without adventitious breath sounds; cough absent Heart: regular rate and rhythm.  Radial pulses 2+ symmetrical bilaterally Skin: warm and dry Psychological: alert and cooperative; normal mood and affect  LABS: No results found for this or any previous visit (from the past 24 hour(s)).   ASSESSMENT & PLAN:  1. Pharyngitis, unspecified etiology    2. Nasal congestion   3. Other fatigue   4. Allergic rhinitis due to other allergic trigger, unspecified seasonality     Meds ordered this encounter  Medications  . fluticasone (FLONASE) 50 MCG/ACT nasal spray    Sig: Place 2 sprays into both nostrils daily.    Dispense:  9.9 mL    Refill:  2    Order Specific Question:   Supervising Provider    Answer:   Chase Picket [id number]  . cetirizine-pseudoephedrine (ZYRTEC-D) 5-120 MG tablet    Sig: Take 1 tablet by mouth daily.    Dispense:  30 tablet    Refill:  0    Order Specific Question:   Supervising Provider    Answer:   Chase Picket [[id number]]     Drink warm or cool liquids, use throat lozenges, or popsicles to help alleviate symptoms Take OTC ibuprofen or tylenol as needed for pain Follow up with PCP if symptoms persist Return or go to ER if you have any new or worsening symptoms such as fever, chills, nausea, vomiting, worsening sore throat, cough, abdominal pain, chest pain, changes in bowel or bladder habits  Reviewed expectations re: course of current medical issues. Questions answered. Outlined signs and symptoms indicating need for more acute intervention. Patient verbalized understanding. After Visit Summary given.          Faustino Congress, NP 06/19/20 [phone]

## 2020-09-01 ENCOUNTER — Encounter: Payer: Self-pay | Admitting: Internal Medicine

## 2020-09-01 ENCOUNTER — Other Ambulatory Visit: Payer: Self-pay | Admitting: Internal Medicine

## 2020-09-01 NOTE — Telephone Encounter (Signed)
Last filled 02/29/2020... please advise

## 2020-09-02 MED ORDER — ALPRAZOLAM 0.5 MG PO TABS: 0.5000 mg | ORAL_TABLET | Freq: Every evening | ORAL | 0 refills | Status: DC | PRN

## 2020-09-02 NOTE — Telephone Encounter (Signed)
What are the names of the new meds?

## 2020-09-07 MED ORDER — BUSPIRONE HCL 5 MG PO TABS: 5.0000 mg | ORAL_TABLET | Freq: Two times a day (BID) | ORAL | 2 refills | Status: DC

## 2020-09-07 NOTE — Addendum Note (Signed)
Addended by: Jearld Fenton on: 09/07/2020 10:23 PM   Modules accepted: Orders

## 2020-09-19 DIAGNOSIS — M2550 Pain in unspecified joint: Secondary | ICD-10-CM | POA: Diagnosis not present

## 2020-09-19 DIAGNOSIS — M1990 Unspecified osteoarthritis, unspecified site: Secondary | ICD-10-CM | POA: Diagnosis not present

## 2020-10-06 DIAGNOSIS — Z23 Encounter for immunization: Secondary | ICD-10-CM | POA: Diagnosis not present

## 2020-10-06 DIAGNOSIS — L719 Rosacea, unspecified: Secondary | ICD-10-CM | POA: Diagnosis not present

## 2020-10-06 DIAGNOSIS — L649 Androgenic alopecia, unspecified: Secondary | ICD-10-CM | POA: Diagnosis not present

## 2020-10-17 ENCOUNTER — Encounter: Payer: Self-pay | Admitting: Internal Medicine

## 2020-10-20 ENCOUNTER — Ambulatory Visit (INDEPENDENT_AMBULATORY_CARE_PROVIDER_SITE_OTHER): Payer: BC Managed Care – PPO | Admitting: Internal Medicine

## 2020-10-20 ENCOUNTER — Encounter: Payer: Self-pay | Admitting: Internal Medicine

## 2020-10-20 ENCOUNTER — Other Ambulatory Visit: Payer: Self-pay

## 2020-10-20 VITALS — BP 136/84 | HR 70 | Temp 97.2°F | Wt 218.0 lb

## 2020-10-20 DIAGNOSIS — R110 Nausea: Secondary | ICD-10-CM

## 2020-10-20 DIAGNOSIS — G43C1 Periodic headache syndromes in child or adult, intractable: Secondary | ICD-10-CM

## 2020-10-20 MED ORDER — TOPIRAMATE 25 MG PO TABS
25.0000 mg | ORAL_TABLET | Freq: Two times a day (BID) | ORAL | 2 refills | Status: DC
Start: 2020-10-20 — End: 2020-11-20

## 2020-10-20 MED ORDER — KETOROLAC TROMETHAMINE 30 MG/ML IJ SOLN
30.0000 mg | Freq: Once | INTRAMUSCULAR | Status: AC
  Administered 2020-10-20: 30 mg via INTRAMUSCULAR

## 2020-10-20 MED ORDER — SUMATRIPTAN SUCCINATE 50 MG PO TABS: 50.0000 mg | ORAL_TABLET | ORAL | 0 refills | Status: DC | PRN

## 2020-10-20 MED ORDER — ONDANSETRON 4 MG PO TBDP
4.0000 mg | ORAL_TABLET | Freq: Once | ORAL | Status: AC
  Administered 2020-10-20: 4 mg via ORAL

## 2020-10-20 NOTE — Patient Instructions (Signed)

## 2020-10-20 NOTE — Assessment & Plan Note (Signed)
Deteriorated 30 mg Toradol IM today Zofran 4 mg PO x 1 in office Unknown triggers Will start Topamax 25 mg PO QHS- sedation caution given Will increase Imitrex  To 50 mg Consider MRI if symptoms persist or worsen

## 2020-10-20 NOTE — Progress Notes (Signed)
Subjective:    Patient ID: [name], female    DOB: 06-25-1966, 54 y.o.   MRN: [id number]  HPI  Pt presents to the clinic today for follow up of her migraines. She feels like these have been worse over the last 2 months. The pain is located in the right side of her head. She describes the pain as intermittent sharp and stabbing. She reports associated sensitivity to light, feels like she has trouble keeping her right eye open. She reports intermittent dizziness, neck pain, sensitivity to sound, nausea and vomiting. She denies visual changes. She takes Imitrex as needed with some relief of symptoms. CT head from 2019 showed:  FINDINGS: Brain: No evidence of acute infarction, hemorrhage, hydrocephalus, extra-axial collection or mass lesion/mass effect.  Vascular: No hyperdense vessel or unexpected calcification.  Skull: Stable chronic scalloping is again noted in the right frontal calvarium with underlying arachnoid cyst. Calcific exostosis is noted in the left occipital region as well as an adjacent partially calcified sebaceous cyst stable in appearance from the prior exam.  Sinuses/Orbits: Mucosal retention cyst within the right maxillary antrum is noted. No other focal abnormality is seen.  She was on Topamax in the past, unsure of why she stopped taking this. She is not following with neurology.  Review of Systems      Past Medical History:  Diagnosis Date  . Allergy   . Anxiety   . Depression   . GERD (gastroesophageal reflux disease)   . History of abnormal cervical Pap smear    1991 -- 1995--hx cryotherapy to cervix  . History of colon polyps    2008- BENIGN  . Hydronephrosis, left   . Kidney stones 2016  . PONV (postoperative nausea and vomiting)     Current Outpatient Medications  Medication Sig Dispense Refill  . ALPRAZolam (XANAX) 0.5 MG tablet Take 1 tablet (0.5 mg total) by mouth at bedtime as needed for anxiety or sleep. 20 tablet 0  .  buPROPion (WELLBUTRIN XL) 150 MG 24 hr tablet Take 1 tablet (150 mg total) by mouth daily. 30 tablet 2  . busPIRone (BUSPAR) 5 MG tablet Take 1 tablet (5 mg total) by mouth 2 (two) times daily. 60 tablet 2  . cetirizine-pseudoephedrine (ZYRTEC-D) 5-120 MG tablet Take 1 tablet by mouth daily. 30 tablet 0  . doxycycline (VIBRAMYCIN) 100 MG capsule Take 100 mg by mouth daily.    . fluticasone (FLONASE) 50 MCG/ACT nasal spray Place 2 sprays into both nostrils daily. 9.9 mL 2  . metroNIDAZOLE (METROCREAM) 0.75 % cream Apply topically 2 (two) times daily.    . Multiple Vitamin (MULTIVITAMIN WITH MINERALS) TABS tablet Take 1 tablet by mouth daily.    . SUMAtriptan (IMITREX) 25 MG tablet Take 1 tablet (25 mg total) by mouth every 2 (two) hours as needed for migraine. May repeat in 2 hours if headache persists or recurs. 30 tablet 1   No current facility-administered medications for this visit.    Allergies  Allergen Reactions  . Compazine [Prochlorperazine]     Altered mental status  . Desvenlafaxine Other (See Comments)    Reaction:  Withdrawal   . Iodinated Diagnostic Agents Rash    Previously mis-entered as Iohexol allergy. Patient is not allergic to Non-Ionic contrast currently.    Family History  Problem Relation Age of Onset  . Hypertension Mother   . Colon polyps Mother   . Diabetes Father   . Colon polyps Father   . Cancer Paternal Uncle  Pancreas  . Pancreatic cancer Paternal Uncle   . Heart attack Maternal Grandmother   . Drug abuse Sister   . Breast cancer Paternal Grandmother   . Esophageal cancer Neg Hx   . Stomach cancer Neg Hx   . Rectal cancer Neg Hx   . Colon cancer Neg Hx     Social History   Socioeconomic History  . Marital status: Married    Spouse name: Not on file  . Number of children: Not on file  . Years of education: Not on file  . Highest education level: Not on file  Occupational History  . Not on file  Tobacco Use  . Smoking status: Former  Smoker    Years: 15.00    Types: Cigarettes    Quit date: 05/16/2008    Years since quitting: 12.4  . Smokeless tobacco: Never Used  Vaping Use  . Vaping Use: Never used  Substance and Sexual Activity  . Alcohol use: No    Alcohol/week: 0.0 standard drinks  . Drug use: No  . Sexual activity: Yes    Partners: Male    Birth control/protection: Surgical    Comment: Hyst  Other Topics Concern  . Not on file  Social History Narrative  . Not on file   Social Determinants of Health   Financial Resource Strain:   . Difficulty of Paying Living Expenses: Not on file  Food Insecurity:   . Worried About Charity fundraiser in the Last Year: Not on file  . Ran Out of Food in the Last Year: Not on file  Transportation Needs:   . Lack of Transportation (Medical): Not on file  . Lack of Transportation (Non-Medical): Not on file  Physical Activity:   . Days of Exercise per Week: Not on file  . Minutes of Exercise per Session: Not on file  Stress:   . Feeling of Stress : Not on file  Social Connections:   . Frequency of Communication with Friends and Family: Not on file  . Frequency of Social Gatherings with Friends and Family: Not on file  . Attends Religious Services: Not on file  . Active Member of Clubs or Organizations: Not on file  . Attends Archivist Meetings: Not on file  . Marital Status: Not on file  Intimate Partner Violence:   . Fear of Current or Ex-Partner: Not on file  . Emotionally Abused: Not on file  . Physically Abused: Not on file  . Sexually Abused: Not on file     Constitutional: Pt reports migraines. Denies fever, malaise, fatigue, or abrupt weight changes.  HEENT: Pt reports intermittent sensitivity to light, sound. Denies eye pain, eye redness, ear pain, ringing in the ears, wax buildup, runny nose, nasal congestion, bloody nose, or sore throat. Respiratory: Denies difficulty breathing, shortness of breath, cough or sputum production.     Cardiovascular: Denies chest pain, chest tightness, palpitations or swelling in the hands or feet.  Gastrointestinal: Pt reports intermittent nausea and vomiting. Denies abdominal pain, bloating, constipation, diarrhea or blood in the stool.  Musculoskeletal: Pt reports intermittent neck pain. Denies decrease in range of motion, difficulty with gait, or joint swelling.  Skin: Denies redness, rashes, lesions or ulcercations.  Neurological: Denies dizziness, difficulty with memory, difficulty with speech or problems with balance and coordination.   No other specific complaints in a complete review of systems (except as listed in HPI above).  Objective:   Physical Exam  BP 136/84   Pulse  70   Temp (!) 97.2 F (36.2 C) (Temporal)   Wt 218 lb (98.9 kg)   LMP 12/24/2006 (Approximate)   SpO2 98%   BMI 35.73 kg/m   Wt Readings from Last 3 Encounters:  04/04/20 241 lb (109.3 kg)  12/28/19 239 lb (108.4 kg)  09/28/19 237 lb (107.5 kg)    General: Appears her stated age, obese, in NAD. HEENT: Head: normal shape and size; Eyes: sclera white, no icterus, conjunctiva pink, PERRLA and EOMs intact; she is swuinting  Cardiovascular: Normal rate and rhythm.  Pulmonary/Chest: Normal effort and positive vesicular breath sounds.   Musculoskeletal: Normal flexion, extension and rotation of the cervical spine. Neurological: Alert and oriented.Coordination normal.    BMET    Component Value Date/Time   NA 139 12/28/2019 1323   K 3.7 12/28/2019 1323   CL 103 12/28/2019 1323   CO2 27 12/28/2019 1323   GLUCOSE 111 (H) 12/28/2019 1323   BUN 9 12/28/2019 1323   CREATININE 0.70 12/28/2019 1323   CALCIUM 9.3 12/28/2019 1323   GFRNONAA >60 11/15/2017 2142   GFRAA >60 11/15/2017 2142    Lipid Panel     Component Value Date/Time   CHOL 186 12/28/2019 1323   TRIG 221.0 (H) 12/28/2019 1323   HDL 43.10 12/28/2019 1323   CHOLHDL 4 12/28/2019 1323   VLDL 44.2 (H) 12/28/2019 1323   LDLCALC 83  04/01/2018 0857    CBC    Component Value Date/Time   WBC 6.1 12/28/2019 1323   RBC 4.51 12/28/2019 1323   HGB 13.2 12/28/2019 1323   HCT 39.4 12/28/2019 1323   PLT 257.0 12/28/2019 1323   MCV 87.3 12/28/2019 1323   MCH 28.9 11/15/2017 2142   MCHC 33.5 12/28/2019 1323   RDW 13.5 12/28/2019 1323   LYMPHSABS 3.0 05/31/2017 2332   MONOABS 0.7 05/31/2017 2332   EOSABS 0.1 05/31/2017 2332   BASOSABS 0.0 05/31/2017 2332    Hgb A1C Lab Results  Component Value Date   HGBA1C 5.5 12/28/2019           Assessment & Plan:    ,Webb Silversmith, NP This visit occurred during the SARS-CoV-2 public health emergency.  Safety protocols were in place, including screening questions prior to the visit, additional usage of staff PPE, and extensive cleaning of exam room while observing appropriate contact time as indicated for disinfecting solutions.

## 2020-10-27 ENCOUNTER — Other Ambulatory Visit: Payer: Self-pay | Admitting: Internal Medicine

## 2020-11-02 ENCOUNTER — Ambulatory Visit (INDEPENDENT_AMBULATORY_CARE_PROVIDER_SITE_OTHER): Payer: BC Managed Care – PPO | Admitting: Internal Medicine

## 2020-11-02 ENCOUNTER — Encounter: Payer: Self-pay | Admitting: Internal Medicine

## 2020-11-02 ENCOUNTER — Other Ambulatory Visit: Payer: Self-pay

## 2020-11-02 DIAGNOSIS — N3000 Acute cystitis without hematuria: Secondary | ICD-10-CM | POA: Diagnosis not present

## 2020-11-02 LAB — POC URINALSYSI DIPSTICK (AUTOMATED)
Bilirubin, UA: NEGATIVE
Glucose, UA: NEGATIVE
Ketones, UA: NEGATIVE
Leukocytes, UA: NEGATIVE
Nitrite, UA: NEGATIVE
Protein, UA: NEGATIVE
Spec Grav, UA: 1.025 (ref 1.010–1.025)
Urobilinogen, UA: 0.2 E.U./dL
pH, UA: 6 (ref 5.0–8.0)

## 2020-11-02 MED ORDER — SULFAMETHOXAZOLE-TRIMETHOPRIM 800-160 MG PO TABS: 1.0000 | ORAL_TABLET | Freq: Two times a day (BID) | ORAL | 1 refills | Status: DC

## 2020-11-02 NOTE — Assessment & Plan Note (Signed)
Symptoms not consistent with stone No reason to consider urethritis (STD) Will treat with 3 days of septra (urine basically negative) Consider CT if pain persists

## 2020-11-02 NOTE — Addendum Note (Signed)
Addended by: Pilar Grammes on: 11/02/2020 08:07 AM   Modules accepted: Orders

## 2020-11-02 NOTE — Progress Notes (Signed)
Subjective:    Patient ID: [name], female    DOB: 13-Apr-1966, 54 y.o.   MRN: [id number]  HPI Here due to urinary symptoms This visit occurred during the SARS-CoV-2 public health emergency.  Safety protocols were in place, including screening questions prior to the visit, additional usage of staff PPE, and extensive cleaning of exam room while observing appropriate contact time as indicated for disinfecting solutions.   Noted urinary frequency Having suprapubic pressure--radiating around the right side Has burning at beginning of stream Started about 5 days----not improving and then worse yesterday  No blood in urine Past kidney stone--pain is not like that  Has taken advil--- helped Azo also helped  Current Outpatient Medications on File Prior to Visit  Medication Sig Dispense Refill  . ALPRAZolam (XANAX) 0.5 MG tablet Take 1 tablet (0.5 mg total) by mouth at bedtime as needed for anxiety or sleep. 20 tablet 0  . busPIRone (BUSPAR) 5 MG tablet Take 1 tablet (5 mg total) by mouth 2 (two) times daily. 60 tablet 2  . diclofenac (VOLTAREN) 75 MG EC tablet Take 75 mg by mouth 2 (two) times daily.    Marland Kitchen doxycycline (VIBRAMYCIN) 100 MG capsule Take 100 mg by mouth daily.    . hydroxychloroquine (PLAQUENIL) 200 MG tablet Take 200 mg by mouth 2 (two) times daily.    . metroNIDAZOLE (METROCREAM) 0.75 % cream Apply topically 2 (two) times daily.    . Minoxidil (ROGAINE WOMENS) 5 % FOAM Apply topically.    . Multiple Vitamin (MULTIVITAMIN WITH MINERALS) TABS tablet Take 1 tablet by mouth daily.    . SUMAtriptan (IMITREX) 50 MG tablet Take 1 tablet (50 mg total) by mouth every 2 (two) hours as needed for migraine. May repeat in 2 hours if headache persists or recurs. 10 tablet 0  . topiramate (TOPAMAX) 25 MG tablet Take 1 tablet (25 mg total) by mouth 2 (two) times daily. 30 tablet 2  . ketoconazole (NIZORAL) 2 % cream SMARTSIG:1 Topical Every Night (Patient not taking: Reported on  11/02/2020)     No current facility-administered medications on file prior to visit.    Allergies  Allergen Reactions  . Compazine [Prochlorperazine]     Altered mental status  . Desvenlafaxine Other (See Comments)    Reaction:  Withdrawal   . Iodinated Diagnostic Agents Rash    Previously mis-entered as Iohexol allergy. Patient is not allergic to Non-Ionic contrast currently.    Past Medical History:  Diagnosis Date  . Allergy   . Anxiety   . Depression   . GERD (gastroesophageal reflux disease)   . History of abnormal cervical Pap smear    1991 -- 1995--hx cryotherapy to cervix  . History of colon polyps    2008- BENIGN  . Hydronephrosis, left   . Kidney stones 2016  . PONV (postoperative nausea and vomiting)     Past Surgical History:  Procedure Laterality Date  . ABDOMINAL HYSTERECTOMY    . ANTERIOR AND POSTERIOR REPAIR WITH SACROSPINOUS FIXATION N/A 08/16/2015   Procedure: ANTERIOR COLPORRHAPHY WITH XENOFORM GRAFT AND SACROSPINOUS FIXATION;  Surgeon: Nunzio Cobbs, MD;  Location: [facility];  Service: Gynecology;  Laterality: N/A;  2.5 hours OR time  . BLADDER SUSPENSION N/A 08/16/2015   Procedure: TRANSVAGINAL TAPE (TVT) PROCEDURE exact midurethral sling;  Surgeon: Nunzio Cobbs, MD;  Location: [facility];  Service: Gynecology;  Laterality: N/A;  . COLONOSCOPY    . COLONOSCOPY W/ POLYPECTOMY  05-30-2007  .  CYSTOSCOPY N/A 08/16/2015   Procedure: CYSTOSCOPY;  Surgeon: Nunzio Cobbs, MD;  Location: [facility];  Service: Gynecology;  Laterality: N/A;  . CYSTOSCOPY W/ RETROGRADES Bilateral 06/22/2015   Procedure: CYSTOSCOPY WITH RETROGRADE PYELOGRAM;  Surgeon: Cleon Gustin, MD;  Location: [facility];  Service: Urology;  Laterality: Bilateral;  . CYSTOSCOPY W/ URETERAL STENT PLACEMENT  02/  2000  . CYSTOSCOPY WITH HOLMIUM LASER LITHOTRIPSY Left 05/18/2015   Procedure: CYSTOSCOPY WITH HOLMIUM LASER LITHOTRIPSY;  Surgeon: Alexis Frock, MD;  Location: [facility];  Service: Urology;  Laterality: Left;  . CYSTOSCOPY WITH RETROGRADE PYELOGRAM, URETEROSCOPY AND STENT PLACEMENT Left 06/22/2015   Procedure: CYSTOSCOPY,  LEFT URETEROSCOPY;  Surgeon: Cleon Gustin, MD;  Location: [facility];  Service: Urology;  Laterality: Left;  . CYSTOSCOPY WITH URETEROSCOPY AND STENT PLACEMENT Left 05/18/2015   Procedure: CYSTOSCOPY WITH URETEROSCOPY, STONE MANIPULATION AND STENT PLACEMENT;  Surgeon: Alexis Frock, MD;  Location: [facility];  Service: Urology;  Laterality: Left;  . DIAGNOSTIC LAPAROSCOPY    . DX LAPAROSCOPY  X2  . ESOPHAGOGASTRODUODENOSCOPY  05-09-2007  . EXPLORATORY LAPAROTOMY W/ BILATERAL SALPINGECTOMY AND REMOVAL RIGHT ECTOPIC PREG.  02-23-2004  . EXTRACORPOREAL SHOCK WAVE LITHOTRIPSY  2001  &  2002  . LAPAROSCOPIC CHOLECYSTECTOMY  1999  . POLYPECTOMY    . SHOULDER ARTHROSCOPY WITH OPEN ROTATOR CUFF REPAIR Right 2012  . TOTAL ABDOMINAL HYSTERECTOMY W/ BILATERAL OOPHORECTOMY AND LYSIS ADHESIONS  09-02-2007  . TUBAL LIGATION Bilateral 1995  . UMBILICAL HERNIA REPAIR  2003  . VAGINAL HYSTERECTOMY N/A 08/18/2015   Procedure: Exam under Anesthesia, Excision Xenform Graft, Removal Bilateral Sacrospinous Ligament sutures;  Surgeon: Nunzio Cobbs, MD;  Location: [facility];  Service: Gynecology;  Laterality: N/A;    Family History  Problem Relation Age of Onset  . Hypertension Mother   . Colon polyps Mother   . Diabetes Father   . Colon polyps Father   . Cancer Paternal Uncle        Pancreas  . Pancreatic cancer Paternal Uncle   . Heart attack Maternal Grandmother   . Drug abuse Sister   . Breast cancer Paternal Grandmother   . Esophageal cancer Neg Hx   . Stomach cancer Neg Hx   . Rectal cancer Neg Hx   . Colon cancer Neg Hx     Social History   Socioeconomic History  . Marital status: Married    Spouse name: Not on file  . Number of children: Not on  file  . Years of education: Not on file  . Highest education level: Not on file  Occupational History  . Not on file  Tobacco Use  . Smoking status: Former Smoker    Years: 15.00    Types: Cigarettes    Quit date: 05/16/2008    Years since quitting: 12.4  . Smokeless tobacco: Never Used  Vaping Use  . Vaping Use: Never used  Substance and Sexual Activity  . Alcohol use: No    Alcohol/week: 0.0 standard drinks  . Drug use: No  . Sexual activity: Yes    Partners: Male    Birth control/protection: Surgical    Comment: Hyst  Other Topics Concern  . Not on file  Social History Narrative  . Not on file   Social Determinants of Health   Financial Resource Strain:   . Difficulty of Paying Living Expenses: Not on file  Food Insecurity:   . Worried  About Running Out of Food in the Last Year: Not on file  . Ran Out of Food in the Last Year: Not on file  Transportation Needs:   . Lack of Transportation (Medical): Not on file  . Lack of Transportation (Non-Medical): Not on file  Physical Activity:   . Days of Exercise per Week: Not on file  . Minutes of Exercise per Session: Not on file  Stress:   . Feeling of Stress : Not on file  Social Connections:   . Frequency of Communication with Friends and Family: Not on file  . Frequency of Social Gatherings with Friends and Family: Not on file  . Attends Religious Services: Not on file  . Active Member of Clubs or Organizations: Not on file  . Attends Archivist Meetings: Not on file  . Marital Status: Not on file  Intimate Partner Violence:   . Fear of Current or Ex-Partner: Not on file  . Emotionally Abused: Not on file  . Physically Abused: Not on file  . Sexually Abused: Not on file   Review of Systems No fever No N/V Eating okay No recent sex due to pain---no dyspareunia (and single partner)    Objective:   Physical Exam Constitutional:      Appearance: Normal appearance.  Abdominal:     Palpations:  Abdomen is soft.     Tenderness: There is no guarding.     Comments: Mild suprapubic tenderness  Musculoskeletal:     Comments: ?slight tenderness around the right CVA area  Neurological:     Mental Status: She is alert.            Assessment & Plan:

## 2020-11-12 ENCOUNTER — Other Ambulatory Visit: Payer: Self-pay | Admitting: Internal Medicine

## 2020-11-14 NOTE — Telephone Encounter (Signed)
Last filled 10/20/2020...Marland Kitchen please advise

## 2020-11-17 ENCOUNTER — Other Ambulatory Visit: Payer: Self-pay | Admitting: Internal Medicine

## 2020-12-21 ENCOUNTER — Other Ambulatory Visit: Payer: Self-pay | Admitting: Internal Medicine

## 2021-02-15 DIAGNOSIS — Z9049 Acquired absence of other specified parts of digestive tract: Secondary | ICD-10-CM | POA: Diagnosis not present

## 2021-02-15 DIAGNOSIS — Z87442 Personal history of urinary calculi: Secondary | ICD-10-CM | POA: Diagnosis not present

## 2021-02-15 DIAGNOSIS — N23 Unspecified renal colic: Secondary | ICD-10-CM | POA: Diagnosis not present

## 2021-02-15 DIAGNOSIS — I700 Atherosclerosis of aorta: Secondary | ICD-10-CM | POA: Diagnosis not present

## 2021-02-15 DIAGNOSIS — K5750 Diverticulosis of both small and large intestine without perforation or abscess without bleeding: Secondary | ICD-10-CM | POA: Diagnosis not present

## 2021-02-16 ENCOUNTER — Other Ambulatory Visit: Payer: Self-pay | Admitting: Internal Medicine

## 2021-03-21 ENCOUNTER — Encounter: Payer: Self-pay | Admitting: Internal Medicine

## 2021-03-22 ENCOUNTER — Encounter (INDEPENDENT_AMBULATORY_CARE_PROVIDER_SITE_OTHER): Payer: Self-pay

## 2021-03-22 ENCOUNTER — Other Ambulatory Visit: Payer: Self-pay | Admitting: Internal Medicine

## 2021-03-23 MED ORDER — ALPRAZOLAM 0.5 MG PO TABS: 0.5000 mg | ORAL_TABLET | Freq: Every evening | ORAL | 0 refills | Status: DC | PRN

## 2021-03-23 NOTE — Telephone Encounter (Signed)
Last filled 09/02/2020... please advise

## 2021-04-30 ENCOUNTER — Encounter: Payer: Self-pay | Admitting: Intensive Care

## 2021-04-30 ENCOUNTER — Emergency Department: Payer: BC Managed Care – PPO

## 2021-04-30 ENCOUNTER — Emergency Department
Admission: EM | Admit: 2021-04-30 | Discharge: 2021-04-30 | Disposition: A | Discharge status: Home / Self Care | Payer: BC Managed Care – PPO | Attending: Emergency Medicine | Admitting: Emergency Medicine

## 2021-04-30 ENCOUNTER — Emergency Department
Admission: EM | Admit: 2021-04-30 | Discharge: 2021-04-30 | Disposition: A | Discharge status: Home / Self Care | Payer: BC Managed Care – PPO | Source: Home / Self Care | Attending: Emergency Medicine | Admitting: Emergency Medicine

## 2021-04-30 ENCOUNTER — Other Ambulatory Visit: Payer: Self-pay

## 2021-04-30 DIAGNOSIS — B9689 Other specified bacterial agents as the cause of diseases classified elsewhere: Secondary | ICD-10-CM | POA: Insufficient documentation

## 2021-04-30 DIAGNOSIS — Z87891 Personal history of nicotine dependence: Secondary | ICD-10-CM | POA: Insufficient documentation

## 2021-04-30 DIAGNOSIS — K219 Gastro-esophageal reflux disease without esophagitis: Secondary | ICD-10-CM | POA: Insufficient documentation

## 2021-04-30 DIAGNOSIS — N3001 Acute cystitis with hematuria: Secondary | ICD-10-CM | POA: Diagnosis not present

## 2021-04-30 DIAGNOSIS — N3090 Cystitis, unspecified without hematuria: Secondary | ICD-10-CM

## 2021-04-30 DIAGNOSIS — R1084 Generalized abdominal pain: Secondary | ICD-10-CM | POA: Diagnosis not present

## 2021-04-30 DIAGNOSIS — R1030 Lower abdominal pain, unspecified: Secondary | ICD-10-CM | POA: Diagnosis not present

## 2021-04-30 DIAGNOSIS — Z9049 Acquired absence of other specified parts of digestive tract: Secondary | ICD-10-CM | POA: Diagnosis not present

## 2021-04-30 DIAGNOSIS — I700 Atherosclerosis of aorta: Secondary | ICD-10-CM | POA: Diagnosis not present

## 2021-04-30 DIAGNOSIS — R109 Unspecified abdominal pain: Secondary | ICD-10-CM | POA: Diagnosis not present

## 2021-04-30 DIAGNOSIS — R52 Pain, unspecified: Secondary | ICD-10-CM | POA: Diagnosis not present

## 2021-04-30 DIAGNOSIS — Z90710 Acquired absence of both cervix and uterus: Secondary | ICD-10-CM | POA: Diagnosis not present

## 2021-04-30 LAB — URINALYSIS, COMPLETE (UACMP) WITH MICROSCOPIC
Bilirubin Urine: NEGATIVE
Glucose, UA: NEGATIVE mg/dL
Ketones, ur: NEGATIVE mg/dL
Nitrite: POSITIVE — AB
Protein, ur: >=300 mg/dL — AB
RBC / HPF: >50 RBC/hpf — ABNORMAL HIGH (ref 0–5)
Specific Gravity, Urine: 1.022 (ref 1.005–1.030)
pH: 6 (ref 5.0–8.0)

## 2021-04-30 LAB — CBC
HCT: 41.6 % (ref 36.0–46.0)
Hemoglobin: 14.3 g/dL (ref 12.0–15.0)
MCH: 29.1 pg (ref 26.0–34.0)
MCHC: 34.4 g/dL (ref 30.0–36.0)
MCV: 84.6 fL (ref 80.0–100.0)
Platelets: 271 10*3/uL (ref 150–400)
RBC: 4.92 MIL/uL (ref 3.87–5.11)
RDW: 13.3 % (ref 11.5–15.5)
WBC: 11.7 10*3/uL — ABNORMAL HIGH (ref 4.0–10.5)
nRBC: 0 % (ref 0.0–0.2)

## 2021-04-30 LAB — BASIC METABOLIC PANEL
Anion gap: 9 (ref 5–15)
BUN: 14 mg/dL (ref 6–20)
CO2: 25 mmol/L (ref 22–32)
Calcium: 9.2 mg/dL (ref 8.9–10.3)
Chloride: 105 mmol/L (ref 98–111)
Creatinine, Ser: 0.65 mg/dL (ref 0.44–1.00)
GFR, Estimated: >60 mL/min (ref >60)
Glucose, Bld: 113 mg/dL — ABNORMAL HIGH (ref 70–99)
Potassium: 4.3 mmol/L (ref 3.5–5.1)
Sodium: 139 mmol/L (ref 135–145)

## 2021-04-30 MED ORDER — PHENAZOPYRIDINE HCL 200 MG PO TABS: 200.0000 mg | ORAL_TABLET | Freq: Three times a day (TID) | ORAL | 0 refills | Status: DC | PRN

## 2021-04-30 MED ORDER — CEFTRIAXONE SODIUM 1 G IJ SOLR
1.0000 g | Freq: Once | INTRAMUSCULAR | Status: AC
  Administered 2021-04-30: 1 g via INTRAMUSCULAR
  Filled 2021-04-30: qty 10

## 2021-04-30 MED ORDER — PHENAZOPYRIDINE HCL 200 MG PO TABS
200.0000 mg | ORAL_TABLET | Freq: Once | ORAL | Status: AC
  Administered 2021-04-30: 200 mg via ORAL
  Filled 2021-04-30: qty 1

## 2021-04-30 MED ORDER — OXYCODONE-ACETAMINOPHEN 5-325 MG PO TABS
1.0000 | ORAL_TABLET | Freq: Once | ORAL | Status: AC
  Administered 2021-04-30: 1 via ORAL
  Filled 2021-04-30: qty 1

## 2021-04-30 MED ORDER — IBUPROFEN 600 MG PO TABS
600.0000 mg | ORAL_TABLET | Freq: Once | ORAL | Status: AC
  Administered 2021-04-30: 600 mg via ORAL
  Filled 2021-04-30: qty 1

## 2021-04-30 MED ORDER — OXYCODONE-ACETAMINOPHEN 5-325 MG PO TABS: 1.0000 | ORAL_TABLET | Freq: Four times a day (QID) | ORAL | 0 refills | Status: AC | PRN

## 2021-04-30 MED ORDER — KETOROLAC TROMETHAMINE 10 MG PO TABS
10.0000 mg | ORAL_TABLET | Freq: Once | ORAL | Status: AC
  Administered 2021-04-30: 10 mg via ORAL
  Filled 2021-04-30: qty 1

## 2021-04-30 MED ORDER — ONDANSETRON 4 MG PO TBDP
4.0000 mg | ORAL_TABLET | Freq: Once | ORAL | Status: AC
  Administered 2021-04-30: 4 mg via ORAL

## 2021-04-30 MED ORDER — LIDOCAINE HCL (PF) 1 % IJ SOLN
INTRAMUSCULAR | Status: AC
  Filled 2021-04-30: qty 5

## 2021-04-30 MED ORDER — NAPROXEN 500 MG PO TABS: 500.0000 mg | ORAL_TABLET | Freq: Two times a day (BID) | ORAL | 0 refills | Status: DC

## 2021-04-30 MED ORDER — ONDANSETRON 4 MG PO TBDP
ORAL_TABLET | ORAL | Status: AC
  Filled 2021-04-30: qty 1

## 2021-04-30 MED ORDER — CEPHALEXIN 500 MG PO CAPS: 500.0000 mg | ORAL_CAPSULE | Freq: Two times a day (BID) | ORAL | 0 refills | Status: AC

## 2021-04-30 MED ORDER — OXYCODONE-ACETAMINOPHEN 5-325 MG PO TABS
1.0000 | ORAL_TABLET | ORAL | Status: AC | PRN
  Administered 2021-04-30 (×2): 1 via ORAL
  Filled 2021-04-30 (×2): qty 1

## 2021-04-30 NOTE — Discharge Instructions (Signed)
Increase water intake and take medication as prescribed.  See primary care or return to the ER for symptoms that are not improving over the next few days.

## 2021-04-30 NOTE — ED Triage Notes (Signed)
Pt reports she was here earlier today and diagnosed with UTI, states "it's not getting any better." Pt reports pain now in her right flank. Denies fevers, reports chills.

## 2021-04-30 NOTE — ED Notes (Signed)
Pt had labs here this AM, none ordered in triage at this time.

## 2021-04-30 NOTE — ED Notes (Signed)
ED Provider at bedside. 

## 2021-04-30 NOTE — ED Provider Notes (Signed)
[facility] Emergency Department Provider Note  ____________________________________________  Time seen: Approximately 10:36 PM  I have reviewed the triage vital signs and the nursing notes.   HISTORY  Chief Complaint Dysuria and Flank Pain    HPI [name] is a 55 y.o. female with a history of depression, GERD, kidney stones who comes the ED complaining of suprapubic pain radiating to the right flank.  She was seen in the ED earlier today with dysuria, diagnosed with a UTI, given IM Rocephin and prescription for Keflex.  She has picked up her medications but reports that throughout her day she has now developed right flank pain associated with chills.  Pain is severe, waxing and waning, no aggravating or alleviating factors.  No chest pain or shortness of breath.      Past Medical History:  Diagnosis Date  . Allergy   . Anxiety   . Depression   . GERD (gastroesophageal reflux disease)   . History of abnormal cervical Pap smear    1991 -- 1995--hx cryotherapy to cervix  . History of colon polyps    2008- BENIGN  . Hydronephrosis, left   . Kidney stones 2016  . PONV (postoperative nausea and vomiting)      Patient Active Problem List   Diagnosis Date Noted  . Acute cystitis without hematuria 11/02/2020  . HLD (hyperlipidemia) 04/01/2018  . GERD (gastroesophageal reflux disease) 04/01/2018  . Anxiety and depression 11/01/2014  . Migraine 02/26/2008     Past Surgical History:  Procedure Laterality Date  . ABDOMINAL HYSTERECTOMY    . ANTERIOR AND POSTERIOR REPAIR WITH SACROSPINOUS FIXATION N/A 08/16/2015   Procedure: ANTERIOR COLPORRHAPHY WITH XENOFORM GRAFT AND SACROSPINOUS FIXATION;  Surgeon: Nunzio Cobbs, MD;  Location: [facility];  Service: Gynecology;  Laterality: N/A;  2.5 hours OR time  . BLADDER SUSPENSION N/A 08/16/2015   Procedure: TRANSVAGINAL TAPE (TVT) PROCEDURE exact midurethral sling;  Surgeon: Nunzio Cobbs, MD;  Location: [facility];  Service: Gynecology;  Laterality: N/A;  . COLONOSCOPY    . COLONOSCOPY W/ POLYPECTOMY  05-30-2007  . CYSTOSCOPY N/A 08/16/2015   Procedure: CYSTOSCOPY;  Surgeon: Nunzio Cobbs, MD;  Location: [facility];  Service: Gynecology;  Laterality: N/A;  . CYSTOSCOPY W/ RETROGRADES Bilateral 06/22/2015   Procedure: CYSTOSCOPY WITH RETROGRADE PYELOGRAM;  Surgeon: Cleon Gustin, MD;  Location: [facility];  Service: Urology;  Laterality: Bilateral;  . CYSTOSCOPY W/ URETERAL STENT PLACEMENT  02/  2000  . CYSTOSCOPY WITH HOLMIUM LASER LITHOTRIPSY Left 05/18/2015   Procedure: CYSTOSCOPY WITH HOLMIUM LASER LITHOTRIPSY;  Surgeon: Alexis Frock, MD;  Location: [facility];  Service: Urology;  Laterality: Left;  . CYSTOSCOPY WITH RETROGRADE PYELOGRAM, URETEROSCOPY AND STENT PLACEMENT Left 06/22/2015   Procedure: CYSTOSCOPY,  LEFT URETEROSCOPY;  Surgeon: Cleon Gustin, MD;  Location: [facility];  Service: Urology;  Laterality: Left;  . CYSTOSCOPY WITH URETEROSCOPY AND STENT PLACEMENT Left 05/18/2015   Procedure: CYSTOSCOPY WITH URETEROSCOPY, STONE MANIPULATION AND STENT PLACEMENT;  Surgeon: Alexis Frock, MD;  Location: [facility];  Service: Urology;  Laterality: Left;  . DIAGNOSTIC LAPAROSCOPY    . DX LAPAROSCOPY  X2  . ESOPHAGOGASTRODUODENOSCOPY  05-09-2007  . EXPLORATORY LAPAROTOMY W/ BILATERAL SALPINGECTOMY AND REMOVAL RIGHT ECTOPIC PREG.  02-23-2004  . EXTRACORPOREAL SHOCK WAVE LITHOTRIPSY  2001  &  2002  . LAPAROSCOPIC CHOLECYSTECTOMY  1999  . POLYPECTOMY    . SHOULDER ARTHROSCOPY  WITH OPEN ROTATOR CUFF REPAIR Right 2012  . TOTAL ABDOMINAL HYSTERECTOMY W/ BILATERAL OOPHORECTOMY AND LYSIS ADHESIONS  09-02-2007  . TUBAL LIGATION Bilateral 1995  . UMBILICAL HERNIA REPAIR  2003  . VAGINAL HYSTERECTOMY N/A 08/18/2015   Procedure: Exam under Anesthesia, Excision Xenform Graft, Removal Bilateral  Sacrospinous Ligament sutures;  Surgeon: Nunzio Cobbs, MD;  Location: [facility];  Service: Gynecology;  Laterality: N/A;     Prior to Admission medications   Medication Sig Start Date End Date Taking? Authorizing Provider  ALPRAZolam Duanne Moron) 0.5 MG tablet Take 1 tablet (0.5 mg total) by mouth at bedtime as needed for anxiety or sleep. 03/23/21   Jearld Fenton, NP  naproxen (NAPROSYN) 500 MG tablet Take 1 tablet (500 mg total) by mouth 2 (two) times daily with a meal. 04/30/21  Yes Carrie Mew, MD  oxyCODONE-acetaminophen (PERCOCET) 5-325 MG tablet Take 1 tablet by mouth every 6 (six) hours as needed for up to 2 days for severe pain. 04/30/21 05/02/21 Yes Carrie Mew, MD  busPIRone (BUSPAR) 5 MG tablet TAKE 1 TABLET BY MOUTH TWICE A DAY 12/22/20   Jearld Fenton, NP  cephALEXin (KEFLEX) 500 MG capsule Take 1 capsule (500 mg total) by mouth 2 (two) times daily for 7 days. 04/30/21 05/07/21  Triplett, Dessa Phi, FNP  diclofenac (VOLTAREN) 75 MG EC tablet Take 75 mg by mouth 2 (two) times daily. 10/03/20   [provider]  hydroxychloroquine (PLAQUENIL) 200 MG tablet Take 200 mg by mouth 2 (two) times daily. 09/29/20   [provider]  ketoconazole (NIZORAL) 2 % cream SMARTSIG:1 Topical Every Night Patient not taking: Reported on 11/02/2020 06/14/20   [provider]  metroNIDAZOLE (METROCREAM) 0.75 % cream Apply topically 2 (two) times daily.    [provider]  Minoxidil (ROGAINE WOMENS) 5 % FOAM Apply topically.    [provider]  Multiple Vitamin (MULTIVITAMIN WITH MINERALS) TABS tablet Take 1 tablet by mouth daily.    [provider]  phenazopyridine (PYRIDIUM) 200 MG tablet Take 1 tablet (200 mg total) by mouth 3 (three) times daily as needed for pain. 04/30/21   Triplett, Johnette Abraham B, FNP  SUMAtriptan (IMITREX) 50 MG tablet TAKE 1 TABLET (50 MG TOTAL) BY MOUTH EVERY 2 (TWO) HOURS AS NEEDED FOR MIGRAINE. MAY REPEAT IN 2 HOURS IF HEADACHE  PERSISTS OR RECURS. 11/15/20   Jearld Fenton, NP  topiramate (TOPAMAX) 25 MG tablet TAKE 1 TABLET BY MOUTH AT BEDTIME AS NEEDED. 02/16/21   Jearld Fenton, NP     Allergies Compazine [prochlorperazine], Desvenlafaxine, and Iodinated diagnostic agents   Family History  Problem Relation Age of Onset  . Hypertension Mother   . Colon polyps Mother   . Diabetes Father   . Colon polyps Father   . Cancer Paternal Uncle        Pancreas  . Pancreatic cancer Paternal Uncle   . Heart attack Maternal Grandmother   . Drug abuse Sister   . Breast cancer Paternal Grandmother   . Esophageal cancer Neg Hx   . Stomach cancer Neg Hx   . Rectal cancer Neg Hx   . Colon cancer Neg Hx     Social History Social History   Tobacco Use  . Smoking status: Former Smoker    Years: 15.00    Types: Cigarettes    Quit date: 05/16/2008    Years since quitting: 12.9  . Smokeless tobacco: Never Used  Vaping Use  . Vaping  Use: Never used  Substance Use Topics  . Alcohol use: No    Alcohol/week: 0.0 standard drinks  . Drug use: No    Review of Systems  Constitutional:   No fever positive chills.  ENT:   No sore throat. No rhinorrhea. Cardiovascular:   No chest pain or syncope. Respiratory:   No dyspnea or cough. Gastrointestinal: Positive suprapubic pain wrapping around to right flank Musculoskeletal:   Negative for focal pain or swelling All other systems reviewed and are negative except as documented above in ROS and HPI.  ____________________________________________   PHYSICAL EXAM:  VITAL SIGNS: ED Triage Vitals  Enc Vitals Group     BP 04/30/21 2106 (!) 151/82     Pulse Rate 04/30/21 2106 97     Resp 04/30/21 2106 18     Temp 04/30/21 2106 99.1 F (37.3 C)     Temp Source 04/30/21 2106 Oral     SpO2 04/30/21 2106 96 %     Weight 04/30/21 2108 235 lb 3.3 oz (106.7 kg)     Height 04/30/21 2108 5\' 6"  (1.676 m)     Head Circumference --      Peak Flow --      Pain Score 04/30/21  2106 10     Pain Loc --      Pain Edu? --      Excl. in [place]? --     Vital signs reviewed, nursing assessments reviewed.   Constitutional:   Alert and oriented. Non-toxic appearance. Eyes:   Conjunctivae are normal. EOMI. PERRL. ENT      Head:   Normocephalic and atraumatic.      Nose:   Wearing a mask.      Mouth/Throat:   Wearing a mask.      Neck:   No meningismus. Full ROM. Hematological/Lymphatic/Immunilogical:   No cervical lymphadenopathy. Cardiovascular:   RRR. Symmetric bilateral radial and DP pulses.  No murmurs. Cap refill less than 2 seconds. Respiratory:   Normal respiratory effort without tachypnea/retractions. Breath sounds are clear and equal bilaterally. No wheezes/rales/rhonchi. Gastrointestinal:   Soft with suprapubic and right-sided abdominal tenderness. Non distended. There is no CVA tenderness.  No rebound, rigidity, or guarding.  Musculoskeletal:   Normal range of motion in all extremities. No joint effusions.  No lower extremity tenderness.  No edema.  There is right lower back tenderness in the musculature. Neurologic:   Normal speech and language.  Motor grossly intact. No acute focal neurologic deficits are appreciated.  Skin:    Skin is warm, dry and intact. No rash noted.  No petechiae, purpura, or bullae.  ____________________________________________    LABS (pertinent positives/negatives) (all labs ordered are listed, but only abnormal results are displayed) Labs Reviewed - No data to display ____________________________________________   EKG    ____________________________________________    RADIOLOGY  CT Renal Stone Study  Result Date: 04/30/2021 CLINICAL DATA:  Flank pain EXAM: CT ABDOMEN AND PELVIS WITHOUT CONTRAST TECHNIQUE: Multidetector CT imaging of the abdomen and pelvis was performed following the standard protocol without IV contrast. COMPARISON:  02/15/2021 FINDINGS: LOWER CHEST: Normal. HEPATOBILIARY: Normal hepatic contours. No  intra- or extrahepatic biliary dilatation. Status post cholecystectomy. PANCREAS: Normal pancreas. No ductal dilatation or peripancreatic fluid collection. SPLEEN: Normal. ADRENALS/URINARY TRACT: The adrenal glands are normal. There is left perinephric stranding. No hydronephrosis. Normal right kidney. The urinary bladder is normal for degree of distention STOMACH/BOWEL: There is no hiatal hernia. Normal duodenal course and caliber. No small bowel dilatation  or inflammation. No focal colonic abnormality. Normal appendix. VASCULAR/LYMPHATIC: There is calcific atherosclerosis of the abdominal aorta. No lymphadenopathy. REPRODUCTIVE: Status post hysterectomy. No adnexal mass. MUSCULOSKELETAL. No bony spinal canal stenosis or focal osseous abnormality. OTHER: None. IMPRESSION: 1. Left perinephric stranding without hydronephrosis or nephrolithiasis. This may indicate ascending urinary tract infection. Correlate with urinalysis. Aortic Atherosclerosis (ICD10-I70.0). Electronically Signed   By: Ulyses Jarred [name]   On: 04/30/2021 22:12    ____________________________________________   PROCEDURES Procedures  ____________________________________________  DIFFERENTIAL DIAGNOSIS   Appendicitis, kidney stone, ureteral obstruction, pyelonephritis  CLINICAL IMPRESSION / ASSESSMENT AND PLAN / ED COURSE  Medications ordered in the ED: Medications  oxyCODONE-acetaminophen (PERCOCET/ROXICET) 5-325 MG per tablet 1 tablet (has no administration in time range)  ketorolac (TORADOL) tablet 10 mg (has no administration in time range)    Pertinent labs & imaging results that were available during my care of the patient were reviewed by me and considered in my medical decision making (see chart for details).  [name] was evaluated in Emergency Department on 04/30/2021 for the symptoms described in the history of present illness. She was evaluated in the context of the global COVID-19 pandemic, which  necessitated consideration that the patient might be at risk for infection with the SARS-CoV-2 virus that causes COVID-19. Institutional protocols and algorithms that pertain to the evaluation of patients at risk for COVID-19 are in a state of rapid change based on information released by regulatory bodies including the CDC and federal and state organizations. These policies and algorithms were followed during the patient's care in the ED.   Patient presents with worsening pain after being diagnosed with UTI earlier today.  Vital signs are normal, she is nontoxic and not septic.  Antibiotic regimen from earlier today appears appropriate.  CT scan obtained which is negative for kidney stone or ureteral obstruction, no evidence of pyelonephritis on the symptomatic side on her right.  There is some nonspecific stranding on the left, but she is not having any left-sided flank pain or CVA tenderness.  Recommend taking anti-inflammatory medicine, will also provide a very limited amount of Percocet for initial pain relief.  Stable for discharge.      ____________________________________________   FINAL CLINICAL IMPRESSION(S) / ED DIAGNOSES    Final diagnoses:  Cystitis     ED Discharge Orders         Ordered    oxyCODONE-acetaminophen (PERCOCET) 5-325 MG tablet  Every 6 hours PRN        04/30/21 2235    naproxen (NAPROSYN) 500 MG tablet  2 times daily with meals        04/30/21 2235          Portions of this note were generated with dragon dictation software. Dictation errors may occur despite best attempts at proofreading.   Carrie Mew, MD 04/30/21 2239

## 2021-04-30 NOTE — ED Notes (Signed)
Pt c/o left pelvic pain radiating to left flank since last night with dark bloody urine and difficulty/pain w/ urination. Pt c/o nausea, denies emesis/diarrhea. Pt is AOX4, teary eyed.

## 2021-04-30 NOTE — ED Notes (Signed)
CT renal stone study completed prior to patient arrival to room -- results pending. Will monitor for acute changes and maintain plan of care.

## 2021-04-30 NOTE — ED Provider Notes (Signed)
[facility] Emergency Department Provider Note ____________________________________________   Event Date/Time   First MD Initiated Contact with Patient 04/30/21 1116     (approximate)  I have reviewed the triage vital signs and the nursing notes.   HISTORY  Chief Complaint Flank Pain  HPI [name] is a 55 y.o. female with history of kidney stone presents to the emergency department for treatment and evaluation of suprapubic abdominal pain that has progressed over the past couple of days. This morning, she had intense pain with urination and noted blood. She denies fever. She reports some nausea without vomiting. No back pain. Reports this does not feel like kidney stone.          Past Medical History:  Diagnosis Date  . Allergy   . Anxiety   . Depression   . GERD (gastroesophageal reflux disease)   . History of abnormal cervical Pap smear    1991 -- 1995--hx cryotherapy to cervix  . History of colon polyps    2008- BENIGN  . Hydronephrosis, left   . Kidney stones 2016  . PONV (postoperative nausea and vomiting)     Patient Active Problem List   Diagnosis Date Noted  . Acute cystitis without hematuria 11/02/2020  . HLD (hyperlipidemia) 04/01/2018  . GERD (gastroesophageal reflux disease) 04/01/2018  . Anxiety and depression 11/01/2014  . Migraine 02/26/2008    Past Surgical History:  Procedure Laterality Date  . ABDOMINAL HYSTERECTOMY    . ANTERIOR AND POSTERIOR REPAIR WITH SACROSPINOUS FIXATION N/A 08/16/2015   Procedure: ANTERIOR COLPORRHAPHY WITH XENOFORM GRAFT AND SACROSPINOUS FIXATION;  Surgeon: Nunzio Cobbs, MD;  Location: [facility];  Service: Gynecology;  Laterality: N/A;  2.5 hours OR time  . BLADDER SUSPENSION N/A 08/16/2015   Procedure: TRANSVAGINAL TAPE (TVT) PROCEDURE exact midurethral sling;  Surgeon: Nunzio Cobbs, MD;  Location: [facility];  Service: Gynecology;  Laterality: N/A;  . COLONOSCOPY     . COLONOSCOPY W/ POLYPECTOMY  05-30-2007  . CYSTOSCOPY N/A 08/16/2015   Procedure: CYSTOSCOPY;  Surgeon: Nunzio Cobbs, MD;  Location: [facility];  Service: Gynecology;  Laterality: N/A;  . CYSTOSCOPY W/ RETROGRADES Bilateral 06/22/2015   Procedure: CYSTOSCOPY WITH RETROGRADE PYELOGRAM;  Surgeon: Cleon Gustin, MD;  Location: [facility];  Service: Urology;  Laterality: Bilateral;  . CYSTOSCOPY W/ URETERAL STENT PLACEMENT  02/  2000  . CYSTOSCOPY WITH HOLMIUM LASER LITHOTRIPSY Left 05/18/2015   Procedure: CYSTOSCOPY WITH HOLMIUM LASER LITHOTRIPSY;  Surgeon: Alexis Frock, MD;  Location: [facility];  Service: Urology;  Laterality: Left;  . CYSTOSCOPY WITH RETROGRADE PYELOGRAM, URETEROSCOPY AND STENT PLACEMENT Left 06/22/2015   Procedure: CYSTOSCOPY,  LEFT URETEROSCOPY;  Surgeon: Cleon Gustin, MD;  Location: [facility];  Service: Urology;  Laterality: Left;  . CYSTOSCOPY WITH URETEROSCOPY AND STENT PLACEMENT Left 05/18/2015   Procedure: CYSTOSCOPY WITH URETEROSCOPY, STONE MANIPULATION AND STENT PLACEMENT;  Surgeon: Alexis Frock, MD;  Location: [facility];  Service: Urology;  Laterality: Left;  . DIAGNOSTIC LAPAROSCOPY    . DX LAPAROSCOPY  X2  . ESOPHAGOGASTRODUODENOSCOPY  05-09-2007  . EXPLORATORY LAPAROTOMY W/ BILATERAL SALPINGECTOMY AND REMOVAL RIGHT ECTOPIC PREG.  02-23-2004  . EXTRACORPOREAL SHOCK WAVE LITHOTRIPSY  2001  &  2002  . LAPAROSCOPIC CHOLECYSTECTOMY  1999  . POLYPECTOMY    . SHOULDER ARTHROSCOPY WITH OPEN ROTATOR CUFF REPAIR Right 2012  . TOTAL ABDOMINAL HYSTERECTOMY W/ BILATERAL OOPHORECTOMY AND LYSIS  ADHESIONS  09-02-2007  . TUBAL LIGATION Bilateral 1995  . UMBILICAL HERNIA REPAIR  2003  . VAGINAL HYSTERECTOMY N/A 08/18/2015   Procedure: Exam under Anesthesia, Excision Xenform Graft, Removal Bilateral Sacrospinous Ligament sutures;  Surgeon: Nunzio Cobbs, MD;  Location: [facility];  Service:  Gynecology;  Laterality: N/A;    Prior to Admission medications   Medication Sig Start Date End Date Taking? Authorizing Provider  ALPRAZolam Duanne Moron) 0.5 MG tablet Take 1 tablet (0.5 mg total) by mouth at bedtime as needed for anxiety or sleep. 03/23/21   Jearld Fenton, NP  cephALEXin (KEFLEX) 500 MG capsule Take 1 capsule (500 mg total) by mouth 2 (two) times daily for 7 days. 04/30/21 05/07/21 Yes Karolynn Infantino B, FNP  phenazopyridine (PYRIDIUM) 200 MG tablet Take 1 tablet (200 mg total) by mouth 3 (three) times daily as needed for pain. 04/30/21  Yes Josiephine Simao B, FNP  busPIRone (BUSPAR) 5 MG tablet TAKE 1 TABLET BY MOUTH TWICE A DAY 12/22/20   Jearld Fenton, NP  diclofenac (VOLTAREN) 75 MG EC tablet Take 75 mg by mouth 2 (two) times daily. 10/03/20   [provider]  hydroxychloroquine (PLAQUENIL) 200 MG tablet Take 200 mg by mouth 2 (two) times daily. 09/29/20   [provider]  ketoconazole (NIZORAL) 2 % cream SMARTSIG:1 Topical Every Night Patient not taking: Reported on 11/02/2020 06/14/20   [provider]  metroNIDAZOLE (METROCREAM) 0.75 % cream Apply topically 2 (two) times daily.    [provider]  Minoxidil (ROGAINE WOMENS) 5 % FOAM Apply topically.    [provider]  Multiple Vitamin (MULTIVITAMIN WITH MINERALS) TABS tablet Take 1 tablet by mouth daily.    [provider]  SUMAtriptan (IMITREX) 50 MG tablet TAKE 1 TABLET (50 MG TOTAL) BY MOUTH EVERY 2 (TWO) HOURS AS NEEDED FOR MIGRAINE. MAY REPEAT IN 2 HOURS IF HEADACHE PERSISTS OR RECURS. 11/15/20   Jearld Fenton, NP  topiramate (TOPAMAX) 25 MG tablet TAKE 1 TABLET BY MOUTH AT BEDTIME AS NEEDED. 02/16/21   Jearld Fenton, NP    Allergies Compazine [prochlorperazine], Desvenlafaxine, and Iodinated diagnostic agents  Family History  Problem Relation Age of Onset  . Hypertension Mother   . Colon polyps Mother   . Diabetes Father   . Colon polyps Father   . Cancer  Paternal Uncle        Pancreas  . Pancreatic cancer Paternal Uncle   . Heart attack Maternal Grandmother   . Drug abuse Sister   . Breast cancer Paternal Grandmother   . Esophageal cancer Neg Hx   . Stomach cancer Neg Hx   . Rectal cancer Neg Hx   . Colon cancer Neg Hx     Social History Social History   Tobacco Use  . Smoking status: Former Smoker    Years: 15.00    Types: Cigarettes    Quit date: 05/16/2008    Years since quitting: 12.9  . Smokeless tobacco: Never Used  Vaping Use  . Vaping Use: Never used  Substance Use Topics  . Alcohol use: No    Alcohol/week: 0.0 standard drinks  . Drug use: No    Review of Systems  Constitutional: No fever/chills Eyes: No visual changes. ENT: No sore throat. Cardiovascular: Denies chest pain. Respiratory: Denies shortness of breath. Gastrointestinal: Positive for abdominal pain. Positive for nausea, no vomiting.  No diarrhea.  No constipation. Genitourinary: Positive for dysuria. Musculoskeletal: Negative for back pain. Skin: Negative  for rash. Neurological: Negative for headaches, focal weakness or numbness. ___________________________________________   PHYSICAL EXAM:  VITAL SIGNS: ED Triage Vitals  Enc Vitals Group     BP 04/30/21 1055 (!) 153/75     Pulse Rate 04/30/21 1055 98     Resp 04/30/21 1055 16     Temp 04/30/21 1055 97.8 F (36.6 C)     Temp Source 04/30/21 1055 Oral     SpO2 04/30/21 1055 100 %     Weight 04/30/21 1056 235 lb (106.6 kg)     Height 04/30/21 1056 5\' 6"  (1.676 m)     Head Circumference --      Peak Flow --      Pain Score 04/30/21 1056 10     Pain Loc --      Pain Edu? --      Excl. in [place]? --     Constitutional: Alert and oriented. Well appearing and in no acute distress. Eyes: Conjunctivae are normal. PERRL. EOMI. Head: Atraumatic. Nose: No congestion/rhinnorhea. Mouth/Throat: Mucous membranes are moist.  Oropharynx non-erythematous. Neck: No stridor.    Hematological/Lymphatic/Immunilogical: No cervical lymphadenopathy. Cardiovascular: Normal rate, regular rhythm. Grossly normal heart sounds.  Good peripheral circulation.  Respiratory: Normal respiratory effort.  No retractions. Lungs CTAB. Gastrointestinal: Soft and nontender. No distention. No abdominal bruits. No CVA tenderness. Genitourinary:  Musculoskeletal: No lower extremity tenderness nor edema.  No joint effusions. Neurologic:  Normal speech and language. No gross focal neurologic deficits are appreciated. No gait instability. Skin:  Skin is warm, dry and intact. No rash noted. Psychiatric: Mood and affect are normal. Speech and behavior are normal.  ____________________________________________   LABS (all labs ordered are listed, but only abnormal results are displayed)  Labs Reviewed  URINALYSIS, COMPLETE (UACMP) WITH MICROSCOPIC - Abnormal; Notable for the following components:      Result Value   Color, Urine AMBER (*)    APPearance CLOUDY (*)    Hgb urine dipstick LARGE (*)    Protein, ur >=300 (*)    Nitrite POSITIVE (*)    Leukocytes,Ua TRACE (*)    RBC / HPF >50 (*)    Bacteria, UA MANY (*)    All other components within normal limits  BASIC METABOLIC PANEL - Abnormal; Notable for the following components:   Glucose, Bld 113 (*)    All other components within normal limits  CBC - Abnormal; Notable for the following components:   WBC 11.7 (*)    All other components within normal limits   ____________________________________________  EKG  Not indicated. ____________________________________________  RADIOLOGY  ED MD interpretation:    Not indicated.  I, Sherrie George, personally viewed and evaluated these images (plain radiographs) as part of my medical decision making, as well as reviewing the written report by the radiologist.  Official radiology report(s): No results  found.  ____________________________________________   PROCEDURES  Procedure(s) performed (including Critical Care):  Procedures  ____________________________________________   INITIAL IMPRESSION / ASSESSMENT AND PLAN     55 year old female complaining of suprapubic and lower abdominal pain.  See HPI.  Triage and assessment nurses notes indicate left flank pain, however patient denied back pain or CVA tenderness on exam.  DIFFERENTIAL DIAGNOSIS  Acute cystitis, pyelonephritis, kidney stone  ED COURSE  Results of the urinalysis showed a mild leukocytosis at 11.7, normal BMP, and significant acute cystitis.  21-50 white blood cells, many bacteria, trace leukocytes, nitrate positive, large amount hemoglobin.  Plan will be to treat her with an  injection of Rocephin and give her some Pyridium.  She will be discharged with prescriptions for Keflex and Pyridium.  She was concerned that she could potentially have a kidney stone, however based on urinalysis results and lack of CVA tenderness it is unlikely and CT is not indicated at this time.  She was then agreeable to discharge but was encouraged to return to the emergency department or see primary care if she is not feeling better within the next few days.    ___________________________________________   FINAL CLINICAL IMPRESSION(S) / ED DIAGNOSES  Final diagnoses:  Acute cystitis with hematuria     ED Discharge Orders         Ordered    cephALEXin (KEFLEX) 500 MG capsule  2 times daily        04/30/21 1141    phenazopyridine (PYRIDIUM) 200 MG tablet  3 times daily PRN        04/30/21 1141           Elleana Loree Shehata was evaluated in Emergency Department on 04/30/2021 for the symptoms described in the history of present illness. She was evaluated in the context of the global COVID-19 pandemic, which necessitated consideration that the patient might be at risk for infection with the SARS-CoV-2 virus that causes  COVID-19. Institutional protocols and algorithms that pertain to the evaluation of patients at risk for COVID-19 are in a state of rapid change based on information released by regulatory bodies including the CDC and federal and state organizations. These policies and algorithms were followed during the patient's care in the ED.   Note:  This document was prepared using Dragon voice recognition software and may include unintentional dictation errors.   Victorino Dike, FNP 04/30/21 1436    Naaman Plummer, MD 04/30/21 [phone]

## 2021-04-30 NOTE — ED Triage Notes (Signed)
Patient c/o flank pain with some blood in urine. Also reports lower pelvic pain that started through the night

## 2021-04-30 NOTE — ED Notes (Signed)
Pt arrives from waiting room via w/c escorted by staff nurse - awake and alert; visitor arrives with patient.

## 2021-04-30 NOTE — ED Notes (Addendum)
Late entry -- Pt awake, alert and oriented -- reports recurrent RLQ pain radiating to the R flank with urinary difficulties and hematuria -- no complaints of n/v/d - fever/chills verbalized.  Pt seen earlier in ED and dx with UTI and discharged home-- returned due to worsening pain and hematuria-- renal stone ruled out-- pt to continue with ABX prescribed by provider on earlier visit to take at home - pt to also now have prescription for percocet and naproxen to manage recurrent pain.  Pt agreeable with d/c plan as discussed by provider- this nurse has verbally reinforced d/c instructions and provided pt with written copy - pt acknowledges verbal understanding and denies any additional questions, concerns, needs-- escorted to exit via w/c -- pt riding home with spouse; 1 person min assist for safety transferring from w/c to passenger seat of vehicle.

## 2021-04-30 NOTE — ED Notes (Signed)
Per Dr. Joni Fears, no additional labs to be drawn at this time but verbal orders for CT renal stone study

## 2021-05-01 ENCOUNTER — Telehealth: Payer: Self-pay

## 2021-05-01 NOTE — Telephone Encounter (Signed)
Should schedule ED follow up with me

## 2021-05-01 NOTE — Telephone Encounter (Signed)
[facility] Night - Client TELEPHONE ADVICE RECORD Access[facility] Patient Name: Brittany ALL [name] Gender: Female DOB: 09/27/1966 Age: 54 Y 5 M 24 D Return Phone Number: [phone] (Primary), [phone] (Secondary) Address: City/ State/ Zip: [place] [address] Client [facility] Night - Client Client Site [facility] - Night Physician Baity, Regina- NP Contact Type Call Who Is Calling Patient / Member / Family / Caregiver Call Type Triage / Clinical Caller Name Shatera Jaspers Relationship To Patient Self Return Phone Number [phone] (Primary) Chief Complaint CHEST PAIN - pain, pressure, heaviness or tightness Reason for Call Symptomatic / Request for Health Information Initial Comment Caller states she was seen in the emergency room for an UTI. She was given antibiotic but the pain is getting worse. She has chills, pain in ribs right side radiating to her back , and nauseated Translation No Nurse Assessment Nurse: Clay, RN, Stephanie Date/Time (Eastern Time): 04/30/2021 7:43:38 PM Confirm and document reason for call. If symptomatic, describe symptoms. ---Caller states that she is having pain in her side, shortness of breath-10+/10; she was seen in the emergency room for an UTI. She was given antibiotic but the pain is getting worse. She has chills, pain in ribs right side radiating to her back , and nauseated; denies other symptoms Does the patient have any new or worsening symptoms? ---Yes Will a triage be completed? ---Yes Related visit to physician within the last 2 weeks? ---Yes Does the PT have any chronic conditions? (i.e. diabetes, asthma, this includes High risk factors for pregnancy, etc.) ---No Is this a behavioral health or substance abuse call? ---No Guidelines Guideline Title Affirmed Question Affirmed Notes Nurse Date/Time (Eastern Time) Breathing Difficulty SEVERE  difficulty breathing (e.g., struggling for each breath, speaks in single words) Clay, RN, Stephanie 04/30/2021 7:45:00 PM PLEASE NOTE: All timestamps contained within this report are represented as Eastern Standard Time. CONFIDENTIALTY NOTICE: This fax transmission is intended only for the addressee. It contains information that is legally privileged, confidential or otherwise protected from use or disclosure. If you are not the intended recipient, you are strictly prohibited from reviewing, disclosing, copying using or disseminating any of this information or taking any action in reliance on or regarding this information. If you have received this fax in error, please notify us immediately by telephone so that we can arrange for its return to us. Phone: [phone], Toll-Free: [phone], Fax: [phone] Page: 2 of 2 Call Id: [id number] Disp. Time (Eastern Time) Disposition Final User 04/30/2021 7:42:01 PM Send to Urgent Queue Stricklin, Tara 04/30/2021 7:50:39 PM 911 Outcome Documentation Clay, RN, Stephanie Reason: [name] route 04/30/2021 7:45:41 PM Call EMS 911 Now Yes Clay, RN, Stephanie Caller Disagree/Comply Comply Caller Understands Yes PreDisposition InappropriateToAsk Care Advice Given Per Guideline CALL EMS 911 NOW: * Immediate medical attention is needed. You need to hang up and call 911 (or an ambulance). * Triager Discretion: I'll call you back in a few minutes to be sure you were able to reach them. CARE ADVICE given per Breathing Difficulty (Adult) guideline. 

## 2021-05-01 NOTE — Telephone Encounter (Signed)
I spoke with pt and pt was seen again last night after 10 PM at [facility] ED; pt said this morning pt is feeling some better with just soreness in rt rib cage area. Pt said she had CT done last night also. Pt wants to continue seeing Avie Echevaria NP and pt has her new contact info at [place] office.  UC & ED precautions given and pt voiced understanding. Sending note to R BaityNP.

## 2021-05-01 NOTE — Telephone Encounter (Signed)
[facility] Night - Client TELEPHONE ADVICE RECORD [facility] Patient Name: [name] Gender: Female DOB: 05-23-1966 Age: 55 Y 59 M 24 D Return Phone Number: [phone] (Primary), [phone] (Secondary) Address: [place] [address] Client [facility] Night - Client Client Site [place] Physician Webb Silversmith- NP Contact Type Call Who Is Calling Patient / Member / Family / Caregiver Call Type Triage / Clinical Caller Name [name] Relationship To Patient Self Return Phone Number [phone] (Primary) Chief Complaint CHEST PAIN - pain, pressure, heaviness or tightness Reason for Call Symptomatic / Request for [place] states she was seen in the emergency room for an UTI. She was given antibiotic but the pain is getting worse. She has chills, pain in ribs right side radiating to her back , and nauseated Translation No Nurse Assessment Nurse: Lissa Hoard, RN, Col[place] Date/Time (Eastern Time): 04/30/2021 7:43:38 PM Confirm and document reason for call. If symptomatic, describe symptoms. ---Caller states that she is having pain in her side, shortness of breath-10+/10; she was seen in the emergency room for an UTI. She was given antibiotic but the pain is getting worse. She has chills, pain in ribs right side radiating to her back , and nauseated; denies other symptoms Does the patient have any new or worsening symptoms? ---Yes Will a triage be completed? ---Yes Related visit to physician within the last 2 weeks? ---Yes Does the PT have any chronic conditions? (i.e. diabetes, asthma, this includes High risk factors for pregnancy, etc.) ---No Is this a behavioral health or substance abuse call? ---No Guidelines Guideline Title Affirmed Question Affirmed Notes Nurse Date/Time (Eastern Time) Breathing Difficulty SEVERE  difficulty breathing (e.g., struggling for each breath, speaks in single words) Lissa Hoard, RN, Colletta [place] 04/30/2021 7:45:00 PM PLEASE NOTE: All timestamps contained within this report are represented as [place] Standard Time. CONFIDENTIALTY NOTICE: This fax transmission is intended only for the addressee. It contains information that is legally privileged, confidential or otherwise protected from use or disclosure. If you are not the intended recipient, you are strictly prohibited from reviewing, disclosing, copying using or disseminating any of this information or taking any action in reliance on or regarding this information. If you have received this fax in error, please notify us immediately by telephone so that we can arrange for its return to [place]. Phone: [phone], Toll-Free: [phone], Fax: [phone] Page: 2 of 2 Call Id: [id number] [place]. Time Eilene Ghazi Time) Disposition Final User 04/30/2021 7:42:01 PM Send to Urgent Lillie Columbia 04/30/2021 7:50:39 PM Royal, RN, Colletta [place] Reason: [name] route 04/30/2021 7:45:41 PM Call EMS 911 Now Yes Lissa Hoard, RN, [place] Caller Disagree/Comply Comply Caller Understands Yes PreDisposition InappropriateToAsk Care Advice Given Per Guideline CALL EMS 911 NOW: * Immediate medical attention is needed. You need to hang up and call 911 (or an ambulance). * Triager Discretion: I'll call you back in a few minutes to be sure you were able to reach them. CARE ADVICE given per Breathing Difficulty (Adult) guideline.

## 2021-05-02 NOTE — Telephone Encounter (Signed)
Attempted to schedule an appt with the patient for the next available appt which would be Monday, but the patient said she will be out of town for the next 2 weeks. I told the patient that I will put her name down for any cancellations this week.

## 2021-05-19 ENCOUNTER — Encounter: Payer: Self-pay | Admitting: Internal Medicine

## 2021-05-30 ENCOUNTER — Encounter: Payer: Self-pay | Admitting: Internal Medicine

## 2021-06-22 ENCOUNTER — Telehealth: Payer: Self-pay | Admitting: Internal Medicine

## 2021-06-22 NOTE — Telephone Encounter (Signed)
Yes I would be happy to see her  

## 2021-06-22 NOTE — Telephone Encounter (Signed)
Patient is scheduled with Dr. Sarajane Jews on 07/08

## 2021-06-22 NOTE — Telephone Encounter (Signed)
Please advise 

## 2021-06-22 NOTE — Telephone Encounter (Signed)
[name] is one of Dr. Barbie Banner patients.  His wife [name] wants to know if Dr. Sarajane Jews will accept her as a new pt.

## 2021-06-22 NOTE — Telephone Encounter (Signed)
Called Mrs. Badia to inform of message. Will call to schedule new patient visit.

## 2021-06-29 ENCOUNTER — Other Ambulatory Visit: Payer: Self-pay

## 2021-06-30 ENCOUNTER — Ambulatory Visit (INDEPENDENT_AMBULATORY_CARE_PROVIDER_SITE_OTHER): Payer: BC Managed Care – PPO | Admitting: Family Medicine

## 2021-06-30 ENCOUNTER — Encounter: Payer: Self-pay | Admitting: Family Medicine

## 2021-06-30 VITALS — BP 110/80 | HR 66 | Temp 97.9°F | Ht 65.75 in | Wt 234.0 lb

## 2021-06-30 DIAGNOSIS — K219 Gastro-esophageal reflux disease without esophagitis: Secondary | ICD-10-CM

## 2021-06-30 DIAGNOSIS — G2581 Restless legs syndrome: Secondary | ICD-10-CM

## 2021-06-30 DIAGNOSIS — F32A Depression, unspecified: Secondary | ICD-10-CM

## 2021-06-30 DIAGNOSIS — M810 Age-related osteoporosis without current pathological fracture: Secondary | ICD-10-CM | POA: Diagnosis not present

## 2021-06-30 DIAGNOSIS — G43C1 Periodic headache syndromes in child or adult, intractable: Secondary | ICD-10-CM

## 2021-06-30 DIAGNOSIS — F419 Anxiety disorder, unspecified: Secondary | ICD-10-CM

## 2021-06-30 MED ORDER — PRAMIPEXOLE DIHYDROCHLORIDE 0.25 MG PO TABS: 0.2500 mg | ORAL_TABLET | Freq: Three times a day (TID) | ORAL | 2 refills | Status: DC

## 2021-06-30 MED ORDER — BUPROPION HCL ER (XL) 150 MG PO TB24
150.0000 mg | ORAL_TABLET | Freq: Every day | ORAL | 2 refills | Status: DC
Start: 2021-06-30 — End: 2021-07-12

## 2021-06-30 MED ORDER — SUMATRIPTAN SUCCINATE 100 MG PO TABS: 100.0000 mg | ORAL_TABLET | ORAL | 11 refills | Status: DC | PRN

## 2021-06-30 MED ORDER — ALPRAZOLAM 0.5 MG PO TABS: 0.5000 mg | ORAL_TABLET | Freq: Two times a day (BID) | ORAL | 2 refills | Status: DC | PRN

## 2021-07-02 ENCOUNTER — Encounter: Payer: Self-pay | Admitting: Family Medicine

## 2021-07-02 NOTE — Progress Notes (Signed)
   Subjective:    Patient ID: [name], female    DOB: 09/20/1966, 55 y.o.   MRN: [id number]  HPI Here for an intro visit after transferring from the care of Brittany Hurter NP. I also see her husband. She has GERD which is controlled with diet. She has migraines, and she gets mixed relief when she takes Sumatriptan 50 mg. Se takes Topamax for maintenance. She takes Xanax on occasion for anxiety, but she thinks she would do better if she went back on a daily medication. She took Wellbutrin some years ago and it worked fairly well for her. She then tried Buspar and Lexapro and these helped a lot, however they caused weight gain. Also she thinks she may have restless legs. For years at night she often has an uncontrollable urge to move her legs around and this often interferes with sleep. There is no pain involved. Finally she asks about beone density test, which she has never had. Her mother is treated for osteoporosis.    Review of Systems  Constitutional: Negative.   Respiratory: Negative.    Cardiovascular: Negative.   Neurological:  Positive for headaches.  Psychiatric/Behavioral:  Positive for sleep disturbance. Negative for agitation, behavioral problems, confusion, decreased concentration, dysphoric mood, hallucinations, self-injury and suicidal ideas. The patient is nervous/anxious.       Objective:   Physical Exam Constitutional:      Appearance: Normal appearance.  Cardiovascular:     Rate and Rhythm: Normal rate and regular rhythm.     Pulses: Normal pulses.     Heart sounds: Normal heart sounds.  Pulmonary:     Effort: Pulmonary effort is normal.     Breath sounds: Normal breath sounds.  Neurological:     General: No focal deficit present.     Mental Status: She is alert and oriented to person, place, and time.  Psychiatric:        Mood and Affect: Mood normal.        Behavior: Behavior normal.          Assessment & Plan:  [place] visit. For her migraines,  we will let her try Sumatriptan 100 mg as needed. She does have restless legs, and she will try Pramipexole 0.25 mg at bedtime. For the anxiety, she will get back on Wellbutrin XL 150 mg daily. For the risk of osteoporosis, we will set up a DEXA soon. I asked her to return in 4 weeks to follow up these issues and also for a well exam with fasting labs.  Brittany Penna, MD

## 2021-07-03 ENCOUNTER — Encounter: Payer: Self-pay | Admitting: Family Medicine

## 2021-07-04 NOTE — Telephone Encounter (Signed)
She is correct. For right now, she is to take it only at night

## 2021-07-05 ENCOUNTER — Ambulatory Visit (INDEPENDENT_AMBULATORY_CARE_PROVIDER_SITE_OTHER)
Admission: RE | Admit: 2021-07-05 | Discharge: 2021-07-05 | Disposition: A | Discharge status: Home / Self Care | Payer: BC Managed Care – PPO | Source: Ambulatory Visit | Attending: Family Medicine | Admitting: Family Medicine

## 2021-07-05 ENCOUNTER — Other Ambulatory Visit: Payer: Self-pay

## 2021-07-05 DIAGNOSIS — M810 Age-related osteoporosis without current pathological fracture: Secondary | ICD-10-CM

## 2021-07-12 ENCOUNTER — Ambulatory Visit (INDEPENDENT_AMBULATORY_CARE_PROVIDER_SITE_OTHER)
Admission: RE | Admit: 2021-07-12 | Discharge: 2021-07-12 | Disposition: A | Discharge status: Home / Self Care | Payer: BC Managed Care – PPO | Source: Ambulatory Visit | Attending: Family Medicine | Admitting: Family Medicine

## 2021-07-12 ENCOUNTER — Encounter: Payer: Self-pay | Admitting: Family Medicine

## 2021-07-12 ENCOUNTER — Ambulatory Visit (INDEPENDENT_AMBULATORY_CARE_PROVIDER_SITE_OTHER): Payer: BC Managed Care – PPO | Admitting: Family Medicine

## 2021-07-12 ENCOUNTER — Other Ambulatory Visit: Payer: BC Managed Care – PPO

## 2021-07-12 ENCOUNTER — Other Ambulatory Visit: Payer: Self-pay

## 2021-07-12 VITALS — BP 116/80 | HR 80 | Temp 97.9°F | Ht 65.75 in | Wt 233.8 lb

## 2021-07-12 DIAGNOSIS — Z0000 Encounter for general adult medical examination without abnormal findings: Secondary | ICD-10-CM

## 2021-07-12 DIAGNOSIS — M160 Bilateral primary osteoarthritis of hip: Secondary | ICD-10-CM | POA: Diagnosis not present

## 2021-07-12 DIAGNOSIS — M25751 Osteophyte, right hip: Secondary | ICD-10-CM | POA: Diagnosis not present

## 2021-07-12 DIAGNOSIS — M25552 Pain in left hip: Secondary | ICD-10-CM

## 2021-07-12 DIAGNOSIS — M25551 Pain in right hip: Secondary | ICD-10-CM

## 2021-07-12 DIAGNOSIS — M533 Sacrococcygeal disorders, not elsewhere classified: Secondary | ICD-10-CM | POA: Diagnosis not present

## 2021-07-12 LAB — LIPID PANEL
Cholesterol: 176 mg/dL (ref 0–200)
HDL: 48.6 mg/dL (ref >39.00)
LDL Cholesterol: 107 mg/dL — ABNORMAL HIGH (ref 0–99)
NonHDL: 127.14
Total CHOL/HDL Ratio: 4
Triglycerides: 102 mg/dL (ref 0.0–149.0)
VLDL: 20.4 mg/dL (ref 0.0–40.0)

## 2021-07-12 LAB — BASIC METABOLIC PANEL
BUN: 18 mg/dL (ref 6–23)
CO2: 25 mEq/L (ref 19–32)
Calcium: 9.4 mg/dL (ref 8.4–10.5)
Chloride: 104 mEq/L (ref 96–112)
Creatinine, Ser: 0.85 mg/dL (ref 0.40–1.20)
GFR: 76.99 mL/min (ref >60.00)
Glucose, Bld: 94 mg/dL (ref 70–99)
Potassium: 4.6 mEq/L (ref 3.5–5.1)
Sodium: 142 mEq/L (ref 135–145)

## 2021-07-12 LAB — C-REACTIVE PROTEIN: CRP: <1 mg/dL (ref 0.5–20.0)

## 2021-07-12 LAB — CBC WITH DIFFERENTIAL/PLATELET
Basophils Absolute: 0 10*3/uL (ref 0.0–0.1)
Basophils Relative: 0.5 % (ref 0.0–3.0)
Eosinophils Absolute: 0 10*3/uL (ref 0.0–0.7)
Eosinophils Relative: 0.3 % (ref 0.0–5.0)
HCT: 39.5 % (ref 36.0–46.0)
Hemoglobin: 13.6 g/dL (ref 12.0–15.0)
Lymphocytes Relative: 28.4 % (ref 12.0–46.0)
Lymphs Abs: 1.5 10*3/uL (ref 0.7–4.0)
MCHC: 34.4 g/dL (ref 30.0–36.0)
MCV: 85.4 fl (ref 78.0–100.0)
Monocytes Absolute: 0.4 10*3/uL (ref 0.1–1.0)
Monocytes Relative: 8 % (ref 3.0–12.0)
Neutro Abs: 3.3 10*3/uL (ref 1.4–7.7)
Neutrophils Relative %: 62.8 % (ref 43.0–77.0)
Platelets: 221 10*3/uL (ref 150.0–400.0)
RBC: 4.62 Mil/uL (ref 3.87–5.11)
RDW: 14 % (ref 11.5–15.5)
WBC: 5.3 10*3/uL (ref 4.0–10.5)

## 2021-07-12 LAB — HEPATIC FUNCTION PANEL
ALT: 14 U/L (ref 0–35)
AST: 16 U/L (ref 0–37)
Albumin: 4.3 g/dL (ref 3.5–5.2)
Alkaline Phosphatase: 83 U/L (ref 39–117)
Bilirubin, Direct: 0.1 mg/dL (ref 0.0–0.3)
Total Bilirubin: 0.4 mg/dL (ref 0.2–1.2)
Total Protein: 6.9 g/dL (ref 6.0–8.3)

## 2021-07-12 LAB — HEMOGLOBIN A1C: Hgb A1c MFr Bld: 5.6 % (ref 4.6–6.5)

## 2021-07-12 LAB — SEDIMENTATION RATE: Sed Rate: 29 mm/hr (ref 0–30)

## 2021-07-12 MED ORDER — BUPROPION HCL ER (XL) 300 MG PO TB24: 300.0000 mg | ORAL_TABLET | Freq: Every day | ORAL | 5 refills | Status: DC

## 2021-07-12 NOTE — Progress Notes (Signed)
Subjective:    Patient ID: [name], female    DOB: [birth year], 55 y.o.   MRN: [id number]  HPI Here for a well exam. We met her a few weeks ago, and she started on Pramipexole for restless legs. This has worked very well for her. She also started on Wellbutrin XL 150 mg daily for anxiety and depression. She feels better now, but she would like to increase the dose. She also mentions chronic pain in both hips that started several years ago. She does not take anything for this.    Review of Systems  Constitutional: Negative.   HENT: Negative.    Eyes: Negative.   Respiratory: Negative.    Cardiovascular: Negative.   Gastrointestinal: Negative.   Genitourinary:  Negative for decreased urine volume, difficulty urinating, dyspareunia, dysuria, enuresis, flank pain, frequency, hematuria, pelvic pain and urgency.  Musculoskeletal:  Positive for arthralgias.  Skin: Negative.   Neurological: Negative.  Negative for headaches.  Psychiatric/Behavioral:  Positive for dysphoric mood. The patient is nervous/anxious.       Objective:   Physical Exam Constitutional:      General: She is not in acute distress.    Appearance: She is well-developed. She is obese.  HENT:     Head: Normocephalic and atraumatic.     Right Ear: External ear normal.     Left Ear: External ear normal.     Nose: Nose normal.     Mouth/Throat:     Pharynx: No oropharyngeal exudate.  Eyes:     General: No scleral icterus.    Conjunctiva/sclera: Conjunctivae normal.     Pupils: Pupils are equal, round, and reactive to light.  Neck:     Thyroid: No thyromegaly.     Vascular: No JVD.  Cardiovascular:     Rate and Rhythm: Normal rate and regular rhythm.     Heart sounds: Normal heart sounds. No murmur heard.   No friction rub. No gallop.  Pulmonary:     Effort: Pulmonary effort is normal. No respiratory distress.     Breath sounds: Normal breath sounds. No wheezing or rales.  Chest:     Chest wall:  No tenderness.  Abdominal:     General: Bowel sounds are normal. There is no distension.     Palpations: Abdomen is soft. There is no mass.     Tenderness: There is no abdominal tenderness. There is no guarding or rebound.  Musculoskeletal:        General: No tenderness. Normal range of motion.     Cervical back: Normal range of motion and neck supple.  Lymphadenopathy:     Cervical: No cervical adenopathy.  Skin:    General: Skin is warm and dry.     Findings: No erythema or rash.  Neurological:     Mental Status: She is alert and oriented to person, place, and time.     Cranial Nerves: No cranial nerve deficit.     Motor: No abnormal muscle tone.     Coordination: Coordination normal.     Deep Tendon Reflexes: Reflexes are normal and symmetric. Reflexes normal.  Psychiatric:        Mood and Affect: Mood normal.        Behavior: Behavior normal.        Thought Content: Thought content normal.        Judgment: Judgment normal.          Assessment & Plan:  Well exam. We discussed diet and  exercise. Get fasting labs. For the anxiety and depression, we will increase the Wellbutrin XL to 300 mg daily. The hip pain is likely due to OA, but we will screen with an RF, ESR, and CRP today. Set up Xrays of the hips.  Alysia Penna, MD

## 2021-07-12 NOTE — Addendum Note (Signed)
Addended by: Elmer Picker on: 07/12/2021 09:11 AM   Modules accepted: Orders

## 2021-07-13 ENCOUNTER — Encounter: Payer: Self-pay | Admitting: Family Medicine

## 2021-07-13 ENCOUNTER — Other Ambulatory Visit: Payer: Self-pay

## 2021-07-13 LAB — T3, FREE: T3, Free: 3.8 pg/mL (ref 2.3–4.2)

## 2021-07-13 LAB — T4, FREE: Free T4: 0.9 ng/dL (ref 0.60–1.60)

## 2021-07-13 LAB — TSH: TSH: 2.17 u[IU]/mL (ref 0.35–5.50)

## 2021-07-13 LAB — RHEUMATOID FACTOR: Rheumatoid fact SerPl-aCnc: <14 IU/mL (ref <14)

## 2021-07-13 MED ORDER — DICLOFENAC SODIUM 75 MG PO TBEC: DELAYED_RELEASE_TABLET | ORAL | 5 refills | Status: DC

## 2021-07-13 NOTE — Telephone Encounter (Signed)
See my Result Note for the Xrays

## 2021-07-13 NOTE — Telephone Encounter (Signed)
I have already spoken with the patient in regards to this. Please see result notes for further documentation.

## 2021-08-14 ENCOUNTER — Other Ambulatory Visit: Payer: Self-pay | Admitting: Family Medicine

## 2021-08-15 DIAGNOSIS — M7061 Trochanteric bursitis, right hip: Secondary | ICD-10-CM | POA: Diagnosis not present

## 2021-08-30 ENCOUNTER — Encounter: Payer: Self-pay | Admitting: Family Medicine

## 2021-08-30 NOTE — Telephone Encounter (Signed)
Set up an OV so we can talk about this  

## 2021-09-04 ENCOUNTER — Encounter: Payer: Self-pay | Admitting: Family Medicine

## 2021-09-04 ENCOUNTER — Telehealth (INDEPENDENT_AMBULATORY_CARE_PROVIDER_SITE_OTHER): Payer: BC Managed Care – PPO | Admitting: Family Medicine

## 2021-09-04 DIAGNOSIS — F900 Attention-deficit hyperactivity disorder, predominantly inattentive type: Secondary | ICD-10-CM

## 2021-09-04 DIAGNOSIS — F909 Attention-deficit hyperactivity disorder, unspecified type: Secondary | ICD-10-CM | POA: Insufficient documentation

## 2021-09-04 MED ORDER — AMPHETAMINE-DEXTROAMPHET ER 10 MG PO CP24: 10.0000 mg | ORAL_CAPSULE | Freq: Every morning | ORAL | 0 refills | Status: DC

## 2021-09-04 MED ORDER — DOXYCYCLINE HYCLATE 100 MG PO CAPS: 100.0000 mg | ORAL_CAPSULE | Freq: Every day | ORAL | 3 refills | Status: DC

## 2021-09-04 MED ORDER — PRAMIPEXOLE DIHYDROCHLORIDE 0.25 MG PO TABS
0.2500 mg | ORAL_TABLET | Freq: Every day | ORAL | 2 refills | Status: DC
Start: 2021-09-04 — End: 2021-11-09

## 2021-09-04 NOTE — Progress Notes (Signed)
Subjective:    Patient ID: [name], female    DOB: 04-26-1966, 55 y.o.   MRN: [id number]  HPI Virtual Visit via Video Note  I connected with the patient on 09/04/21 at  4:00 PM EDT by a video enabled telemedicine application and verified that I am speaking with the correct person using two identifiers.  Location patient: home Location provider:work or home office Persons participating in the virtual visit: patient, provider  I discussed the limitations of evaluation and management by telemedicine and the availability of in person appointments. The patient expressed understanding and agreed to proceed.   HPI: Here asking for help with her focus. About a year ago she began to struggle with trouble focusing on tasks and completing tasks. This occurs at work and at home. She will often move from task to task until she has several things going on at once, and she never finishes any of them. She is easily distracted and often forgets to do things. She says this was not a problem when she was younger, such as when she was in school. She is very pleased with how the Wellbutrin is working, and her anxiety is well controlled. She almost never takes a Xanax now. She also sleeps well, and a single Pramipexole pill at bedtime has stopped the restless legs.    ROS: See pertinent positives and negatives per HPI.  Past Medical History:  Diagnosis Date   Allergy    Anxiety    Depression    GERD (gastroesophageal reflux disease)    History of abnormal cervical Pap smear    1991 -- 1995--hx cryotherapy to cervix   History of colon polyps    2008- BENIGN   Hydronephrosis, left    Kidney stones 2016   PONV (postoperative nausea and vomiting)     Past Surgical History:  Procedure Laterality Date   ABDOMINAL HYSTERECTOMY     ANTERIOR AND POSTERIOR REPAIR WITH SACROSPINOUS FIXATION N/A 08/16/2015   Procedure: ANTERIOR COLPORRHAPHY WITH XENOFORM GRAFT AND SACROSPINOUS FIXATION;   Surgeon: Nunzio Cobbs, MD;  Location: [facility];  Service: Gynecology;  Laterality: N/A;  2.5 hours OR time   BLADDER SUSPENSION N/A 08/16/2015   Procedure: TRANSVAGINAL TAPE (TVT) PROCEDURE exact midurethral sling;  Surgeon: Nunzio Cobbs, MD;  Location: [facility];  Service: Gynecology;  Laterality: N/A;   COLONOSCOPY  08/21/2017   per Dr. Ardis Hughs, clear, repeat in 10 yrs   CYSTOSCOPY N/A 08/16/2015   Procedure: CYSTOSCOPY;  Surgeon: Nunzio Cobbs, MD;  Location: [facility];  Service: Gynecology;  Laterality: N/A;   CYSTOSCOPY W/ RETROGRADES Bilateral 06/22/2015   Procedure: CYSTOSCOPY WITH RETROGRADE PYELOGRAM;  Surgeon: Cleon Gustin, MD;  Location: [facility];  Service: Urology;  Laterality: Bilateral;   CYSTOSCOPY W/ URETERAL STENT PLACEMENT  01/1999   CYSTOSCOPY WITH HOLMIUM LASER LITHOTRIPSY Left 05/18/2015   Procedure: CYSTOSCOPY WITH HOLMIUM LASER LITHOTRIPSY;  Surgeon: Alexis Frock, MD;  Location: [facility];  Service: Urology;  Laterality: Left;   CYSTOSCOPY WITH RETROGRADE PYELOGRAM, URETEROSCOPY AND STENT PLACEMENT Left 06/22/2015   Procedure: CYSTOSCOPY,  LEFT URETEROSCOPY;  Surgeon: Cleon Gustin, MD;  Location: [facility];  Service: Urology;  Laterality: Left;   CYSTOSCOPY WITH URETEROSCOPY AND STENT PLACEMENT Left 05/18/2015   Procedure: CYSTOSCOPY WITH URETEROSCOPY, STONE MANIPULATION AND STENT PLACEMENT;  Surgeon: Alexis Frock, MD;  Location: [facility];  Service: Urology;  Laterality: Left;  DIAGNOSTIC LAPAROSCOPY     DX LAPAROSCOPY  X2   ESOPHAGOGASTRODUODENOSCOPY  05/09/2007   EXPLORATORY LAPAROTOMY W/ BILATERAL SALPINGECTOMY AND REMOVAL RIGHT ECTOPIC PREG.  02/23/2004   EXTRACORPOREAL SHOCK WAVE LITHOTRIPSY  2001  &  2002   LAPAROSCOPIC CHOLECYSTECTOMY  1999   POLYPECTOMY     SHOULDER ARTHROSCOPY WITH OPEN ROTATOR CUFF REPAIR Right 2012   TOTAL ABDOMINAL HYSTERECTOMY W/  BILATERAL OOPHORECTOMY AND LYSIS ADHESIONS  09/02/2007   TUBAL LIGATION Bilateral [id number]   UMBILICAL HERNIA REPAIR  2003   VAGINAL HYSTERECTOMY N/A 08/18/2015   Procedure: Exam under Anesthesia, Excision Xenform Graft, Removal Bilateral Sacrospinous Ligament sutures;  Surgeon: Nunzio Cobbs, MD;  Location: [facility];  Service: Gynecology;  Laterality: N/A;    Family History  Problem Relation Age of Onset   Kidney disease Mother    Hypertension Mother    Colon polyps Mother    Diabetes Father    Colon polyps Father    Drug abuse Sister    Cancer Paternal Uncle        Pancreas   Pancreatic cancer Paternal Uncle    Heart attack Maternal Grandmother    Breast cancer Paternal Grandmother    Esophageal cancer Neg Hx    Stomach cancer Neg Hx    Rectal cancer Neg Hx    Colon cancer Neg Hx      Current Outpatient Medications:    ALPRAZolam (XANAX) 0.5 MG tablet, Take 1 tablet (0.5 mg total) by mouth 2 (two) times daily as needed for anxiety., Disp: 60 tablet, Rfl: 2   amphetamine-dextroamphetamine (ADDERALL XR) 10 MG 24 hr capsule, Take 1 capsule (10 mg total) by mouth in the morning., Disp: 30 capsule, Rfl: 0   buPROPion (WELLBUTRIN XL) 300 MG 24 hr tablet, Take 1 tablet (300 mg total) by mouth daily., Disp: 30 tablet, Rfl: 5   metroNIDAZOLE (METROCREAM) 0.75 % cream, Apply topically 2 (two) times daily., Disp: , Rfl:    Multiple Vitamin (MULTIVITAMIN WITH MINERALS) TABS tablet, Take 1 tablet by mouth daily., Disp: , Rfl:    SUMAtriptan (IMITREX) 100 MG tablet, Take 1 tablet (100 mg total) by mouth as needed for migraine. May repeat in 2 hours if headache persists or recurs., Disp: 10 tablet, Rfl: 11   doxycycline (VIBRAMYCIN) 100 MG capsule, Take 1 capsule (100 mg total) by mouth daily., Disp: 90 capsule, Rfl: 3   pramipexole (MIRAPEX) 0.25 MG tablet, Take 1 tablet (0.25 mg total) by mouth at bedtime., Disp: 30 tablet, Rfl: 2  EXAM:  VITALS per patient if applicable:  GENERAL:  alert, oriented, appears well and in no acute distress  HEENT: atraumatic, conjunttiva clear, no obvious abnormalities on inspection of external nose and ears  NECK: normal movements of the head and neck  LUNGS: on inspection no signs of respiratory distress, breathing rate appears normal, no obvious gross SOB, gasping or wheezing  CV: no obvious cyanosis  MS: moves all visible extremities without noticeable abnormality  PSYCH/NEURO: pleasant and cooperative, no obvious depression or anxiety, speech and thought processing grossly intact  ASSESSMENT AND PLAN: She seems to have a mild form of ADHD. She will try Adderall XR 10 mg each morning. Follow up with [place] in 2-3 weeks. Alysia Penna, MD  Discussed the following assessment and plan:  Attention deficit hyperactivity disorder (ADHD), predominantly inattentive type     I discussed the assessment and treatment plan with the patient. The patient was provided an opportunity to ask questions and  all were answered. The patient agreed with the plan and demonstrated an understanding of the instructions.   The patient was advised to call back or seek an in-person evaluation if the symptoms worsen or if the condition fails to improve as anticipated.      Review of Systems     Objective:   Physical Exam        Assessment & Plan:

## 2021-09-07 ENCOUNTER — Encounter: Payer: Self-pay | Admitting: Family Medicine

## 2021-09-08 NOTE — Telephone Encounter (Signed)
Try taking 2 Adderall capsules at a time

## 2021-09-12 ENCOUNTER — Other Ambulatory Visit: Payer: Self-pay | Admitting: Family Medicine

## 2021-09-19 ENCOUNTER — Encounter: Payer: Self-pay | Admitting: Family Medicine

## 2021-09-19 ENCOUNTER — Ambulatory Visit (INDEPENDENT_AMBULATORY_CARE_PROVIDER_SITE_OTHER): Payer: BC Managed Care – PPO | Admitting: Family Medicine

## 2021-09-19 ENCOUNTER — Other Ambulatory Visit: Payer: Self-pay

## 2021-09-19 VITALS — BP 128/80 | HR 70 | Temp 97.8°F | Wt 224.0 lb

## 2021-09-19 DIAGNOSIS — F900 Attention-deficit hyperactivity disorder, predominantly inattentive type: Secondary | ICD-10-CM

## 2021-09-19 DIAGNOSIS — G2581 Restless legs syndrome: Secondary | ICD-10-CM

## 2021-09-19 DIAGNOSIS — Z23 Encounter for immunization: Secondary | ICD-10-CM

## 2021-09-19 MED ORDER — AMPHETAMINE-DEXTROAMPHET ER 30 MG PO CP24: 30.0000 mg | ORAL_CAPSULE | ORAL | 0 refills | Status: DC

## 2021-09-19 NOTE — Progress Notes (Signed)
   Subjective:    Patient ID: [name], female    DOB: [birth year], 55 y.o.   MRN: [id number]  HPI Here to follow up on recently diagnosed ADHD and restless legs. We saw her on 09-04-21 and we started her on Pramipexole 0.25 mg at bedtime and on Adderall XR 10 mg each morning. The Pramipexole has worked very well and now she can sleep through the night. The Adderall has helped her focus a tiny bit, so we agreed she could take 2 at a time (2o mg) every morning. This has been a little better but she says it runs out at lunchtime and she struggles through the afternoons. No side effects to report.   Review of Systems  Constitutional: Negative.   Respiratory: Negative.    Cardiovascular: Negative.   Psychiatric/Behavioral:  Positive for decreased concentration. Negative for confusion.       Objective:   Physical Exam Constitutional:      Appearance: Normal appearance.  Cardiovascular:     Rate and Rhythm: Normal rate and regular rhythm.     Pulses: Normal pulses.     Heart sounds: Normal heart sounds.  Pulmonary:     Effort: Pulmonary effort is normal.     Breath sounds: Normal breath sounds.  Neurological:     General: No focal deficit present.     Mental Status: She is alert and oriented to person, place, and time.  Psychiatric:        Mood and Affect: Mood normal.        Behavior: Behavior normal.          Assessment & Plan:  Her restless legs are now well controlled so we will stay on the Pramipexole as above. For the ADHD she will try Adderall XR 30 mg each morning and then report back in 2 weeks.  Alysia Penna, MD

## 2021-09-20 ENCOUNTER — Other Ambulatory Visit: Payer: Self-pay | Admitting: Family Medicine

## 2021-09-20 ENCOUNTER — Encounter: Payer: Self-pay | Admitting: Family Medicine

## 2021-09-21 NOTE — Telephone Encounter (Signed)
Please check with the pharmacy about this

## 2021-09-22 MED ORDER — AMPHETAMINE-DEXTROAMPHET ER 30 MG PO CP24: 30.0000 mg | ORAL_CAPSULE | ORAL | 0 refills | Status: DC

## 2021-09-22 NOTE — Telephone Encounter (Signed)
Request has been sent to Dr. Sarajane Jews

## 2021-09-22 NOTE — Telephone Encounter (Signed)
Done

## 2021-10-11 ENCOUNTER — Encounter: Payer: Self-pay | Admitting: Family Medicine

## 2021-10-11 NOTE — Telephone Encounter (Signed)
Yes a stimulant like Adderall can worsen someone's anxiety. I would keep taking the Adderall for now, and she can take a Xanax if needed on top of that. We will talk more at her OV

## 2021-10-17 ENCOUNTER — Telehealth (INDEPENDENT_AMBULATORY_CARE_PROVIDER_SITE_OTHER): Payer: BC Managed Care – PPO | Admitting: Family Medicine

## 2021-10-17 ENCOUNTER — Encounter: Payer: Self-pay | Admitting: Family Medicine

## 2021-10-17 DIAGNOSIS — F419 Anxiety disorder, unspecified: Secondary | ICD-10-CM | POA: Diagnosis not present

## 2021-10-17 DIAGNOSIS — F32A Depression, unspecified: Secondary | ICD-10-CM | POA: Diagnosis not present

## 2021-10-17 DIAGNOSIS — F900 Attention-deficit hyperactivity disorder, predominantly inattentive type: Secondary | ICD-10-CM

## 2021-10-17 MED ORDER — AMPHETAMINE-DEXTROAMPHET ER 20 MG PO CP24: ORAL_CAPSULE | ORAL | 0 refills | Status: DC

## 2021-10-17 NOTE — Progress Notes (Signed)
Subjective:    Patient ID: [name], female    DOB: 1966-05-05, 55 y.o.   MRN: [id number]  HPI Virtual Visit via Video Note  I connected with the patient on 10/17/21 at  4:00 PM EDT by a video enabled telemedicine application and verified that I am speaking with the correct person using two identifiers.  Location patient: home Location provider:work or home office Persons participating in the virtual visit: patient, provider  I discussed the limitations of evaluation and management by telemedicine and the availability of in person appointments. The patient expressed understanding and agreed to proceed.   HPI: Here to follow up on ADHD and anxiety. She has been taking Adderall XR 30 mg each morning. She says this works about like the 20 mg dose for the mornings, but both of them wear off too soon in the afternoons. Otherwise her anxiety as been a little worse lately, especially in the evenings and at night. Sometimes it is hard to sleep. She has Xanax at home that she uses on occasion.    ROS: See pertinent positives and negatives per HPI.  Past Medical History:  Diagnosis Date   Allergy    Anxiety    Depression    GERD (gastroesophageal reflux disease)    History of abnormal cervical Pap smear    1991 -- 1995--hx cryotherapy to cervix   History of colon polyps    2008- BENIGN   Hydronephrosis, left    Kidney stones 2016   PONV (postoperative nausea and vomiting)     Past Surgical History:  Procedure Laterality Date   ABDOMINAL HYSTERECTOMY     ANTERIOR AND POSTERIOR REPAIR WITH SACROSPINOUS FIXATION N/A 08/16/2015   Procedure: ANTERIOR COLPORRHAPHY WITH XENOFORM GRAFT AND SACROSPINOUS FIXATION;  Surgeon: Nunzio Cobbs, MD;  Location: [facility];  Service: Gynecology;  Laterality: N/A;  2.5 hours OR time   BLADDER SUSPENSION N/A 08/16/2015   Procedure: TRANSVAGINAL TAPE (TVT) PROCEDURE exact midurethral sling;  Surgeon: Nunzio Cobbs, MD;   Location: [facility];  Service: Gynecology;  Laterality: N/A;   COLONOSCOPY  08/21/2017   per Dr. Ardis Hughs, clear, repeat in 10 yrs   CYSTOSCOPY N/A 08/16/2015   Procedure: CYSTOSCOPY;  Surgeon: Nunzio Cobbs, MD;  Location: [facility];  Service: Gynecology;  Laterality: N/A;   CYSTOSCOPY W/ RETROGRADES Bilateral 06/22/2015   Procedure: CYSTOSCOPY WITH RETROGRADE PYELOGRAM;  Surgeon: Cleon Gustin, MD;  Location: [facility];  Service: Urology;  Laterality: Bilateral;   CYSTOSCOPY W/ URETERAL STENT PLACEMENT  01/1999   CYSTOSCOPY WITH HOLMIUM LASER LITHOTRIPSY Left 05/18/2015   Procedure: CYSTOSCOPY WITH HOLMIUM LASER LITHOTRIPSY;  Surgeon: Alexis Frock, MD;  Location: [facility];  Service: Urology;  Laterality: Left;   CYSTOSCOPY WITH RETROGRADE PYELOGRAM, URETEROSCOPY AND STENT PLACEMENT Left 06/22/2015   Procedure: CYSTOSCOPY,  LEFT URETEROSCOPY;  Surgeon: Cleon Gustin, MD;  Location: [facility];  Service: Urology;  Laterality: Left;   CYSTOSCOPY WITH URETEROSCOPY AND STENT PLACEMENT Left 05/18/2015   Procedure: CYSTOSCOPY WITH URETEROSCOPY, STONE MANIPULATION AND STENT PLACEMENT;  Surgeon: Alexis Frock, MD;  Location: [facility];  Service: Urology;  Laterality: Left;   DIAGNOSTIC LAPAROSCOPY     DX LAPAROSCOPY  X2   ESOPHAGOGASTRODUODENOSCOPY  05/09/2007   EXPLORATORY LAPAROTOMY W/ BILATERAL SALPINGECTOMY AND REMOVAL RIGHT ECTOPIC PREG.  02/23/2004   EXTRACORPOREAL SHOCK WAVE LITHOTRIPSY  2001  &  2002   LAPAROSCOPIC CHOLECYSTECTOMY  1999   POLYPECTOMY     SHOULDER ARTHROSCOPY WITH OPEN ROTATOR CUFF REPAIR Right 2012   TOTAL ABDOMINAL HYSTERECTOMY W/ BILATERAL OOPHORECTOMY AND LYSIS ADHESIONS  09/02/2007   TUBAL LIGATION Bilateral 6270   UMBILICAL HERNIA REPAIR  2003   VAGINAL HYSTERECTOMY N/A 08/18/2015   Procedure: Exam under Anesthesia, Excision Xenform Graft, Removal Bilateral Sacrospinous Ligament  sutures;  Surgeon: Nunzio Cobbs, MD;  Location: [facility];  Service: Gynecology;  Laterality: N/A;    Family History  Problem Relation Age of Onset   Kidney disease Mother    Hypertension Mother    Colon polyps Mother    Diabetes Father    Colon polyps Father    Drug abuse Sister    Cancer Paternal Uncle        Pancreas   Pancreatic cancer Paternal Uncle    Heart attack Maternal Grandmother    Breast cancer Paternal Grandmother    Esophageal cancer Neg Hx    Stomach cancer Neg Hx    Rectal cancer Neg Hx    Colon cancer Neg Hx      Current Outpatient Medications:    ALPRAZolam (XANAX) 0.5 MG tablet, Take 1 tablet (0.5 mg total) by mouth 2 (two) times daily as needed for anxiety., Disp: 60 tablet, Rfl: 2   amphetamine-dextroamphetamine (ADDERALL XR) 20 MG 24 hr capsule, Take one in the morning and one at noon, Disp: 60 capsule, Rfl: 0   buPROPion (WELLBUTRIN XL) 300 MG 24 hr tablet, TAKE 1 TABLET BY MOUTH EVERY DAY, Disp: 90 tablet, Rfl: 1   doxycycline (VIBRAMYCIN) 100 MG capsule, Take 1 capsule (100 mg total) by mouth daily., Disp: 90 capsule, Rfl: 3   metroNIDAZOLE (METROCREAM) 0.75 % cream, Apply topically 2 (two) times daily., Disp: , Rfl:    Multiple Vitamin (MULTIVITAMIN WITH MINERALS) TABS tablet, Take 1 tablet by mouth daily., Disp: , Rfl:    pramipexole (MIRAPEX) 0.25 MG tablet, Take 1 tablet (0.25 mg total) by mouth at bedtime., Disp: 30 tablet, Rfl: 2   SUMAtriptan (IMITREX) 100 MG tablet, Take 1 tablet (100 mg total) by mouth as needed for migraine. May repeat in 2 hours if headache persists or recurs., Disp: 10 tablet, Rfl: 11  EXAM:  VITALS per patient if applicable:  GENERAL: alert, oriented, appears well and in no acute distress  HEENT: atraumatic, conjunttiva clear, no obvious abnormalities on inspection of external nose and ears  NECK: normal movements of the head and neck  LUNGS: on inspection no signs of respiratory distress, breathing rate  appears normal, no obvious gross SOB, gasping or wheezing  CV: no obvious cyanosis  MS: moves all visible extremities without noticeable abnormality  PSYCH/NEURO: pleasant and cooperative, no obvious depression or anxiety, speech and thought processing grossly intact  ASSESSMENT AND PLAN: For the ADHD, we will go back to the 20 mg dose of Adderall XR, but she will take it twice a day. She will take one first thing in the mornings, and then take the other one around lunchtime. For the evening anxiety, I advised her to take the Xanax regularly for a few weeks. She should take one as soon as she gets home from work, then take the second one at bedtime. She will report back in a few weeks.  We spent a total of ( 30  ) minutes reviewing records and discussing these issues.  Alysia Penna, MD  Discussed the following assessment and plan:  Attention deficit hyperactivity disorder (ADHD), predominantly  inattentive type  Anxiety and depression     I discussed the assessment and treatment plan with the patient. The patient was provided an opportunity to ask questions and all were answered. The patient agreed with the plan and demonstrated an understanding of the instructions.   The patient was advised to call back or seek an in-person evaluation if the symptoms worsen or if the condition fails to improve as anticipated.      Review of Systems     Objective:   Physical Exam        Assessment & Plan:

## 2021-10-18 ENCOUNTER — Encounter: Payer: Self-pay | Admitting: Family Medicine

## 2021-10-18 MED ORDER — AMPHETAMINE-DEXTROAMPHET ER 20 MG PO CP24: ORAL_CAPSULE | ORAL | 0 refills | Status: DC

## 2021-10-18 NOTE — Telephone Encounter (Signed)
Done

## 2021-10-20 ENCOUNTER — Telehealth: Payer: Self-pay

## 2021-10-20 MED ORDER — AMPHETAMINE-DEXTROAMPHETAMINE 10 MG PO TABS: 10.0000 mg | ORAL_TABLET | Freq: Two times a day (BID) | ORAL | 0 refills | Status: DC

## 2021-10-20 NOTE — Telephone Encounter (Signed)
I sent in one month supply of Adderall 10 mg twice daily until supply of her regular medication back in.

## 2021-10-20 NOTE — Telephone Encounter (Addendum)
This is Dr. Sarajane Jews patient.   Please see My Chart message from 10/18/21.   Please advise if can assist in this matter.

## 2021-10-20 NOTE — Telephone Encounter (Signed)
Pt is calling back and [facility] [place] has immediate release adderall in stock [id number] - [place], [place] Phone:  [phone]  Fax:  [phone]

## 2021-10-20 NOTE — Telephone Encounter (Signed)
See message.

## 2021-10-20 NOTE — Telephone Encounter (Signed)
Lvm for patient to check by pharmacy of choice for immediate release Adderall if in stock notify office so can send to pharmacy

## 2021-10-20 NOTE — Addendum Note (Signed)
Addended by: Eulas Post on: 10/20/2021 04:31 PM   Modules accepted: Orders

## 2021-10-20 NOTE — Telephone Encounter (Signed)
Patient called stating that her pharmacy doesn't have amphetamine-dextroamphetamine (ADDERALL XR) 20 MG 24 hr capsule And would like to know if there is another prescription that can be prescribed.

## 2021-10-23 NOTE — Telephone Encounter (Signed)
Patient had previously sent communication via my chart.   Reply message from Dr has been sent via to patient my chart.

## 2021-11-02 NOTE — Telephone Encounter (Signed)
Since the immediate release form is readily available, I suggest she stay on Adderall 20 mg BID. We can send in more refills if that's what she wants to do

## 2021-11-08 NOTE — Telephone Encounter (Signed)
Send pt a message, advising  to update [place] when she changes her pharmacy

## 2021-11-08 NOTE — Telephone Encounter (Signed)
It sounds like we need to wait until she tells [place] which pharmacy to use

## 2021-11-09 ENCOUNTER — Telehealth: Payer: Self-pay | Admitting: Family Medicine

## 2021-11-09 ENCOUNTER — Other Ambulatory Visit: Payer: Self-pay

## 2021-11-09 MED ORDER — PRAMIPEXOLE DIHYDROCHLORIDE 0.25 MG PO TABS: 0.2500 mg | ORAL_TABLET | Freq: Every day | ORAL | 2 refills | Status: DC

## 2021-11-09 MED ORDER — AMPHETAMINE-DEXTROAMPHET ER 20 MG PO CP24: ORAL_CAPSULE | ORAL | 0 refills | Status: DC

## 2021-11-09 MED ORDER — BUPROPION HCL ER (XL) 300 MG PO TB24: 300.0000 mg | ORAL_TABLET | Freq: Every day | ORAL | 1 refills | Status: DC

## 2021-11-09 MED ORDER — SUMATRIPTAN SUCCINATE 100 MG PO TABS
100.0000 mg | ORAL_TABLET | ORAL | 11 refills | Status: DC | PRN
Start: 2021-11-09 — End: 2022-06-15

## 2021-11-09 MED ORDER — ALPRAZOLAM 0.5 MG PO TABS: 0.5000 mg | ORAL_TABLET | Freq: Two times a day (BID) | ORAL | 5 refills | Status: DC | PRN

## 2021-11-09 MED ORDER — DOXYCYCLINE HYCLATE 100 MG PO CAPS: 100.0000 mg | ORAL_CAPSULE | Freq: Every day | ORAL | 3 refills | Status: DC

## 2021-11-09 MED ORDER — AMPHETAMINE-DEXTROAMPHETAMINE 10 MG PO TABS: 10.0000 mg | ORAL_TABLET | Freq: Two times a day (BID) | ORAL | 0 refills | Status: DC

## 2021-11-09 NOTE — Telephone Encounter (Signed)
Pt call and stated she want a call back to talk about her medication before changing pharmacy.

## 2021-11-09 NOTE — Telephone Encounter (Signed)
I sent refills for all the meds to [facility]. Please call to cancel the [facility] at [facility]

## 2021-11-09 NOTE — Telephone Encounter (Signed)
Spoke with pt state that her new pharmacy will be [facility] in [place], pt requests for all Rx refill be sent

## 2021-11-10 NOTE — Telephone Encounter (Signed)
Spoke with pt pharmacy advised per Dr Sarajane Jews to cancel all pt Rx, verbalized understanding

## 2021-11-13 ENCOUNTER — Encounter: Payer: Self-pay | Admitting: Family Medicine

## 2021-11-13 MED ORDER — AMPHETAMINE-DEXTROAMPHETAMINE 20 MG PO TABS: 20.0000 mg | ORAL_TABLET | Freq: Two times a day (BID) | ORAL | 0 refills | Status: DC

## 2021-11-13 NOTE — Telephone Encounter (Signed)
Dr. Sarajane Jews patient.  See message.   Please advise if message should be address when Dr. Sarajane Jews returns.

## 2021-11-13 NOTE — Telephone Encounter (Signed)
I sent in one month supply of Adderall 20 mg bid until Dr Sarajane Jews returns.

## 2021-11-29 ENCOUNTER — Ambulatory Visit: Payer: No Typology Code available for payment source | Admitting: Family Medicine

## 2021-11-29 ENCOUNTER — Encounter: Payer: Self-pay | Admitting: Family Medicine

## 2021-11-29 VITALS — BP 124/82 | HR 81 | Temp 98.2°F | Wt 214.0 lb

## 2021-11-29 DIAGNOSIS — F900 Attention-deficit hyperactivity disorder, predominantly inattentive type: Secondary | ICD-10-CM

## 2021-11-29 MED ORDER — LISDEXAMFETAMINE DIMESYLATE 40 MG PO CAPS: 40.0000 mg | ORAL_CAPSULE | ORAL | 0 refills | Status: DC

## 2021-11-29 MED ORDER — PRAMIPEXOLE DIHYDROCHLORIDE 0.25 MG PO TABS: 0.2500 mg | ORAL_TABLET | Freq: Every day | ORAL | 3 refills | Status: DC

## 2021-11-29 NOTE — Progress Notes (Signed)
   Subjective:    Patient ID: [name], female    DOB: 1966/03/24, 55 y.o.   MRN: [id number]  HPI Here to follow up on ADHD. She has been happy with how Adderall has helped her function, but there ave been supply problems at her pharmacy. This has been the case with both the immediate release and extended release forms. She wants to change to another medication. She sleeps well and her BP has been stable.    Review of Systems  Constitutional: Negative.   Respiratory: Negative.    Cardiovascular: Negative.       Objective:   Physical Exam Constitutional:      Appearance: Normal appearance.  Cardiovascular:     Rate and Rhythm: Normal rate and regular rhythm.     Pulses: Normal pulses.     Heart sounds: Normal heart sounds.  Pulmonary:     Effort: Pulmonary effort is normal.     Breath sounds: Normal breath sounds.  Neurological:     Mental Status: She is alert.          Assessment & Plan:  ADHD. We will stop Adderall and try Vyvanse 40 mg daily. Follow up in one month.  Alysia Penna, MD

## 2021-12-26 ENCOUNTER — Encounter: Payer: Self-pay | Admitting: Family Medicine

## 2021-12-27 MED ORDER — LISDEXAMFETAMINE DIMESYLATE 30 MG PO CAPS: 30.0000 mg | ORAL_CAPSULE | Freq: Every day | ORAL | 0 refills | Status: DC

## 2021-12-27 MED ORDER — AMPHETAMINE-DEXTROAMPHETAMINE 20 MG PO TABS: 20.0000 mg | ORAL_TABLET | Freq: Two times a day (BID) | ORAL | 0 refills | Status: DC

## 2021-12-27 NOTE — Telephone Encounter (Signed)
I sent in the Vyvanse. Please call to cancel the Adderall

## 2021-12-27 NOTE — Telephone Encounter (Signed)
I sent in for some Adderall 20 mg

## 2022-01-02 MED ORDER — AMPHETAMINE-DEXTROAMPHETAMINE 30 MG PO TABS: 30.0000 mg | ORAL_TABLET | Freq: Two times a day (BID) | ORAL | 0 refills | Status: DC

## 2022-01-02 NOTE — Telephone Encounter (Signed)
O stopped the Vyvanse and sent in Adderall 30 mg

## 2022-01-12 ENCOUNTER — Encounter: Payer: Self-pay | Admitting: Family Medicine

## 2022-01-12 NOTE — Telephone Encounter (Signed)
I suggest she talk to one of the therapists at [facility]. Call [phone] for an appt

## 2022-01-24 ENCOUNTER — Telehealth: Payer: Self-pay | Admitting: Family Medicine

## 2022-01-24 NOTE — Telephone Encounter (Signed)
Please look into this. Patients do NOT need a referral to talk to a therapist

## 2022-01-24 NOTE — Telephone Encounter (Signed)
Attempted to call [facility] regarding request for referral, office already closed, left detailed message for pt advised her to call them back in the morning and tell them Dr Sarajane Jews states that she does not need a referral to see a Social worked

## 2022-01-24 NOTE — Telephone Encounter (Signed)
Pt is calling and [facility] told her she needs a referral from  dr fry and the fax number is [phone]. Pt said they will be able to get her in soon. Pt needs to be seen due to anxiety etc

## 2022-01-24 NOTE — Telephone Encounter (Signed)
Please advise 

## 2022-01-30 NOTE — Telephone Encounter (Signed)
Spoke with pt state that she has bee trying to reach [facility] with no success, Pt state that she will call [facility] and try schedule appointment with on e of the therapists  there, pt state that she will call the office if needs further assistance

## 2022-02-05 ENCOUNTER — Ambulatory Visit: Payer: No Typology Code available for payment source | Admitting: Family Medicine

## 2022-02-09 ENCOUNTER — Encounter: Payer: Self-pay | Admitting: Family Medicine

## 2022-02-09 ENCOUNTER — Ambulatory Visit: Payer: No Typology Code available for payment source | Admitting: Family Medicine

## 2022-02-09 VITALS — BP 118/80 | HR 78 | Temp 97.5°F | Wt 206.4 lb

## 2022-02-09 DIAGNOSIS — F32A Depression, unspecified: Secondary | ICD-10-CM | POA: Diagnosis not present

## 2022-02-09 DIAGNOSIS — F419 Anxiety disorder, unspecified: Secondary | ICD-10-CM

## 2022-02-09 DIAGNOSIS — F900 Attention-deficit hyperactivity disorder, predominantly inattentive type: Secondary | ICD-10-CM | POA: Diagnosis not present

## 2022-02-09 MED ORDER — AMPHETAMINE-DEXTROAMPHETAMINE 30 MG PO TABS: 30.0000 mg | ORAL_TABLET | Freq: Two times a day (BID) | ORAL | 0 refills | Status: DC

## 2022-02-09 MED ORDER — ALPRAZOLAM 1 MG PO TABS: 1.0000 mg | ORAL_TABLET | Freq: Two times a day (BID) | ORAL | 5 refills | Status: DC | PRN

## 2022-02-09 NOTE — Progress Notes (Signed)
° °  Subjective:    Patient ID: [name], female    DOB: [birth year], 56 y.o.   MRN: [id number]  HPI Here to follow up on anxiety and ADHD. She is pleased with the Adderall, and the Xanax has been very helpful since we increased the dose. She will meet with a psychotherapist for the first time this coming week.    Review of Systems  Constitutional: Negative.   Respiratory: Negative.    Cardiovascular: Negative.   Neurological: Negative.   Psychiatric/Behavioral:  Positive for decreased concentration and dysphoric mood. Negative for agitation, behavioral problems, confusion and hallucinations. The patient is not nervous/anxious and is not hyperactive.       Objective:   Physical Exam Constitutional:      Appearance: Normal appearance.  Cardiovascular:     Rate and Rhythm: Normal rate and regular rhythm.     Pulses: Normal pulses.     Heart sounds: Normal heart sounds.  Pulmonary:     Effort: Pulmonary effort is normal.     Breath sounds: Normal breath sounds.  Neurological:     General: No focal deficit present.     Mental Status: She is alert and oriented to person, place, and time.  Psychiatric:        Behavior: Behavior normal.        Thought Content: Thought content normal.          Assessment & Plan:  For the ADHD, we will refill the Addrall 30 mg BID for 3 months. For the anxiety, we will increase the dose of Xanax 1 mg BID. She will begin therapy as above. Alysia Penna, MD

## 2022-02-13 ENCOUNTER — Ambulatory Visit (INDEPENDENT_AMBULATORY_CARE_PROVIDER_SITE_OTHER): Payer: No Typology Code available for payment source | Admitting: Psychology

## 2022-02-13 DIAGNOSIS — F330 Major depressive disorder, recurrent, mild: Secondary | ICD-10-CM | POA: Diagnosis not present

## 2022-02-13 DIAGNOSIS — F411 Generalized anxiety disorder: Secondary | ICD-10-CM | POA: Diagnosis not present

## 2022-02-13 NOTE — Progress Notes (Signed)
[facility] Counselor Initial Adult Exam  Name: [name] Date: 02/13/2022 MRN: [id number] DOB: 03-Dec-1966 PCP: Brittany Morale, MD  Time spent: 10:00am-10:52am  Guardian/Payee:  self    Paperwork requested:  n/a  Reason for Visit [name] Problem: Pt presents for a virtual video visit via caregility.  Pt joins from her home and counselor from her home office.  Pt is referred by [name] for counseling due to stress. Pt states "I have always had anxiety and have taking medication to get through."  Pt reports over the past couple months she has been experiencing increased anxiety and that can't control.  Pt repots it is starting to impact her marriage and her life.  Pt reports that her husband as suggested she talk to someone and her PCP recommended as well.  Pt endorsed feeling  panicky, worry and crying and not sure why.  Pt scored a 14 on GAD 7 endorsing anxious/on edge, worry, irritability and feeling afraid something awful might happen.  Pt scored a 12 on PHQ9 and endorsed depressed mood, sleep distrubance, low self worth, low energy and some loss of interest.  Pt reports stress eating.   Pt denies SI, pt denies SA, pt denies psychosis.   Recent stressors include: job- company purchased out by another company Nov 2022 and changes that are happening day to day w/ that, Parents are getting older and seeing more needs- her mom fractured her hip last year, incident in 2020 when woke in middle of night to person in back yard and knocking to get in.  Pt reported that was a teen from nearby group home that left and was having mental health crisis.  Pt reported that felt threatened at the time.  in 2020 her 2 son's and their families moved in with them to save money and stressful/busy household for several months they lived together.   Pt also recognizes that she is very empathetic and will end of taking on other's pain and also has a helper will try to fix things for other  when can't. Pt also wanting to "figure out me and who I am after being a mom, grandmother, wife and worker."  Mental Status Exam: Appearance:   Fairly Groomed and Pt was sick in the bed today for her visit      Behavior:  Appropriate  Motor:  Normal  Speech/Language:   Normal Rate  Affect:  Appropriate  Mood:  anxious and depressed  Thought process:  normal  Thought content:    WNL  Sensory/Perceptual disturbances:    WNL  Orientation:  oriented to person, place, time/date, and situation  Attention:  Good  Concentration:  Good  Memory:  WNL  Fund of knowledge:   Good  Insight:    Good  Judgment:   Good  Impulse Control:  Good    Reported Symptoms:  Pt reports increased anxiety/feeling on edge, feeling afraid if something awful will happen, excessive worry, irritable, emotional escalations easily.  Pt reports depressed mood, sleep disturbance-some nights waking and unable to return to sleep and other times difficulty falling asleep w/ rumination on worries, fatigue and feeling bad about self.  Pt reports that Adderall prescribed by PCP has assisted w/ concentration.    Risk Assessment: Danger to Self:  No Self-injurious Behavior: No Danger to Others: No Duty to Warn:no Physical Aggression / Violence:No  Access to Firearms a concern: No  Gang Involvement:No  Patient / guardian was educated about steps to take if  suicide or homicide risk level increases between visits: n/a While future psychiatric events cannot be accurately predicted, the patient does not currently require acute inpatient psychiatric care and does not currently meet [facility] involuntary commitment criteria.  Substance Abuse History: Current substance abuse: No     Past Psychiatric History:   Previous psychological history is significant for anxiety Outpatient Providers:no hx of outpatient counseling.  History of Psych Hospitalization: No  Psychological Testing:  none    Abuse History:  Victim of: No.,   none    Report needed: No. Victim of Neglect:No. Perpetrator of  none   Witness / Exposure to Domestic Violence: No   Protective Services Involvement: No  Witness to [employer] Violence:  No   Family History:  Family History  Problem Relation Age of Onset   Kidney disease Mother    Hypertension Mother    Colon polyps Mother    Diabetes Father    Colon polyps Father    Drug abuse Sister    Cancer Paternal Uncle        Pancreas   Pancreatic cancer Paternal Uncle    Heart attack Maternal Grandmother    Breast cancer Paternal Grandmother    Esophageal cancer Neg Hx    Stomach cancer Neg Hx    Rectal cancer Neg Hx    Colon cancer Neg Hx   Grew up in [place] younger brother, had a sister 2004 drug overdose.  62- high school years parents divorced.  Relationship [employer].  Mom pretty much. Stay at home.  Estranged  Different.   Senior year mom moved to [place] w/ relationship.  [place] got [place] and had kids.  Sister kept in contact w/ things going on.  Brother very to herself.  [place] mom.  Mom side only one left.  Brother has anxiety.  Niece mental health problems.  SA w/ sister.    Living situation: the patient lives with her spouse  Sexual Orientation: Straight  Relationship Status: married since 19 Name of spouse / other:[name].  Husband on disability.   If a parent, number of children / ages: 2 boys age 27y/o and 72y/o and 4 grandchildren ages infant to 8y/o.  Support Systems: spouse And mom  Financial Stress:  No   Income/Employment/Disability: Employment.  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  Works from [employer]	[place] work hours- but flexible. At this company since 56 y/o.   [name] is awesome.    Military Service: No   Educational History: Education: some college 1 year business school  degree  Religion/Sprituality/World View: Not reported  Any cultural differences that may affect / interfere with treatment:  not applicable    Recreation/Hobbies: getting nails done. Breeds Rotwieler dogs.  Going to beach.  Try to get together for Sunday family dinners.  Stressors: Other: job changes, aging parents, incident of person on property in middle of the night in 2022, in 2020 sons moving back home with their families to save money- anxiety w/ everyone there and different personalities living in same house.     Strengths: Supportive Relationships, seeking tx  Barriers:  none reported   Legal History: Pending legal issue / charges: The patient has no significant history of legal issues. History of legal issue / charges:  none reported  Medical History/Surgical History: reviewed Past Medical History:  Diagnosis Date   Allergy    Anxiety    Depression    GERD (gastroesophageal reflux disease)    History of abnormal cervical Pap  smear    1991 -- 1995--hx cryotherapy to cervix   History of colon polyps    2008- BENIGN   Hydronephrosis, left    Kidney stones 2016   PONV (postoperative nausea and vomiting)     Past Surgical History:  Procedure Laterality Date   ABDOMINAL HYSTERECTOMY     ANTERIOR AND POSTERIOR REPAIR WITH SACROSPINOUS FIXATION N/A 08/16/2015   Procedure: ANTERIOR COLPORRHAPHY WITH XENOFORM GRAFT AND SACROSPINOUS FIXATION;  Surgeon: Nunzio Cobbs, MD;  Location: [facility];  Service: Gynecology;  Laterality: N/A;  2.5 hours OR time   BLADDER SUSPENSION N/A 08/16/2015   Procedure: TRANSVAGINAL TAPE (TVT) PROCEDURE exact midurethral sling;  Surgeon: Nunzio Cobbs, MD;  Location: [facility];  Service: Gynecology;  Laterality: N/A;   COLONOSCOPY  08/21/2017   per Dr. Ardis Hughs, clear, repeat in 10 yrs   CYSTOSCOPY N/A 08/16/2015   Procedure: CYSTOSCOPY;  Surgeon: Nunzio Cobbs, MD;  Location: [facility];  Service: Gynecology;  Laterality: N/A;   CYSTOSCOPY W/ RETROGRADES Bilateral 06/22/2015   Procedure: CYSTOSCOPY WITH RETROGRADE PYELOGRAM;  Surgeon: Cleon Gustin, MD;   Location[facility];  Service: Urology;  Laterality: Bilateral;   CYSTOSCOPY W/ URETERAL STENT PLACEMENT  01/1999   CYSTOSCOPY WITH HOLMIUM LASER LITHOTRIPSY Left 05/18/2015   Procedure: CYSTOSCOPY WITH HOLMIUM LASER LITHOTRIPSY;  Surgeon: Alexis Frock, MD;  Location: [facility];  Service: Urology;  Laterality: Left;   CYSTOSCOPY WITH RETROGRADE PYELOGRAM, URETEROSCOPY AND STENT PLACEMENT Left 06/22/2015   Procedure: CYSTOSCOPY,  LEFT URETEROSCOPY;  Surgeon: Cleon Gustin, MD;  Location: [facility];  Service: Urology;  Laterality: Left;   CYSTOSCOPY WITH URETEROSCOPY AND STENT PLACEMENT Left 05/18/2015   Procedure: CYSTOSCOPY WITH URETEROSCOPY, STONE MANIPULATION AND STENT PLACEMENT;  Surgeon: Alexis Frock, MD;  [facility];  Service: Urology;  Laterality: Left;   DIAGNOSTIC LAPAROSCOPY     DX LAPAROSCOPY  X2   ESOPHAGOGASTRODUODENOSCOPY  05/09/2007   EXPLORATORY LAPAROTOMY W/ BILATERAL SALPINGECTOMY AND REMOVAL RIGHT ECTOPIC PREG.  02/23/2004   EXTRACORPOREAL SHOCK WAVE LITHOTRIPSY  2001  &  2002   LAPAROSCOPIC CHOLECYSTECTOMY  1999   POLYPECTOMY     SHOULDER ARTHROSCOPY WITH OPEN ROTATOR CUFF REPAIR Right 2012   TOTAL ABDOMINAL HYSTERECTOMY W/ BILATERAL OOPHORECTOMY AND LYSIS ADHESIONS  09/02/2007   TUBAL LIGATION Bilateral 9562   UMBILICAL HERNIA REPAIR  2003   VAGINAL HYSTERECTOMY N/A 08/18/2015   Procedure: Exam under Anesthesia, Excision Xenform Graft, Removal Bilateral Sacrospinous Ligament sutures;  Surgeon: Nunzio Cobbs, MD;  Location: [facility];  Service: Gynecology;  Laterality: N/A;    Medications: Current Outpatient Medications  Medication Sig Dispense Refill   ALPRAZolam (XANAX) 1 MG tablet Take 1 tablet (1 mg total) by mouth 2 (two) times daily as needed for anxiety. 60 tablet 5   [START ON 04/09/2022] amphetamine-dextroamphetamine (ADDERALL) 30 MG tablet Take 1 tablet by mouth 2 (two)  times daily. 60 tablet 0   buPROPion (WELLBUTRIN XL) 300 MG 24 hr tablet Take 1 tablet (300 mg total) by mouth daily. 90 tablet 1   doxycycline (VIBRAMYCIN) 100 MG capsule Take 1 capsule (100 mg total) by mouth daily. 90 capsule 3   metroNIDAZOLE (METROCREAM) 0.75 % cream Apply topically 2 (two) times daily.     Multiple Vitamin (MULTIVITAMIN WITH MINERALS) TABS tablet Take 1 tablet by mouth daily.     pramipexole (MIRAPEX) 0.25 MG tablet Take 1 tablet (  0.25 mg total) by mouth at bedtime. 90 tablet 3   SUMAtriptan (IMITREX) 100 MG tablet Take 1 tablet (100 mg total) by mouth as needed for migraine. May repeat in 2 hours if headache persists or recurs. 10 tablet 11   No current facility-administered medications for this visit.    Allergies  Allergen Reactions   Compazine [Prochlorperazine]     Altered mental status   Desvenlafaxine Other (See Comments)    Reaction:  Withdrawal    Iodinated Contrast Media Rash    Previously mis-entered as Iohexol allergy. Patient is not allergic to Non-Ionic contrast currently.    Diagnoses:  Generalized anxiety disorder  Mild episode of recurrent major depressive disorder ([place])  Plan of Care: pt is a 55y/o married female seeking counseling for anxiety as referred by her PCP.  Pt reports hx of anxiety tx w/ medications for several years.  Pt reports in past several months anxiety has been increasing and feeling uncontrollable w/ increased emotional escalations.  Pt also endorses depressive symptoms that are mild in nature.  Pt identifies some life stressors and seeking counseling to assist in coping through anxiety.  Pt to f/u n 2 weeks for counseling  and to f/u w/ PCP for continued medication management.    Individualized Treatment Plan Strengths: seeking counseling, enjoys getting nails done, enjoys breeding rottweiler dogs, enjoys the beach, family Sunday dinners  Supports: her husband and her mom   Goal/Needs for Treatment:  In order of  importance to patient 1) cope with anxiety 2) increase self awareness    Client Statement of Needs: "Finding me again.  Learning how to control certain emotions.  Find out why something bothers me.  Where is this anxiety coming from and how to Deal with it"     Treatment Level: outpatient counseling  Symptoms:anxiety, worry, ruminating thoughts, emotional escalations, depressed mood, sleep disturbance  Client Treatment Preferences:biweekly counseling and continue medication management w/ PCP   Healthcare consumer's goal for treatment:  Counselor, [name], [facility] will support the patient's ability to achieve the goals identified. Cognitive Behavioral Therapy, Assertive Communication/Conflict Resolution Training, Relaxation Training, ACT, Humanistic and other evidenced-based practices will be used to promote progress towards healthy functioning.   Healthcare consumer will: Actively participate in therapy, working towards healthy functioning.    *Justification for Continuation/Discontinuation of Goal: R=Revised, O=Ongoing, A=Achieved, D=Discontinued  Goal 1) Pt will increase coping with life's anxiety and stressors AEB decreased anxiety, decreased emotional escalations, decreased sleep disturbance, increased awareness of thought patterns that impact anxiety. Baseline date 02/13/22: Progress towards goal 0; How Often - Daily Target Date Goal Was reviewed Status Code Progress towards goal  02/13/23                Goal 2)  Increase self awareness and identifying her likes, values, interest as demonstrated by expressing and communicating in sessions.  Baseline date 02/13/22: Progress towards goal 0; How Often - Daily  Target Date Goal Was reviewed Status Code Progress towards goal/Likert rating  02/13/23                This plan has been reviewed and created by the following participants:  This plan will be reviewed at least every 12 months. Date Behavioral Health Clinician Date  Guardian/Patient   02/13/22 [facility] Ms [name]                      02/13/22 Verbal consent provided  [name], [facility]

## 2022-02-13 NOTE — Progress Notes (Signed)
? ? ? ? ? ? ? ? ? ? ? ? ? ? ?  [name], LCMHC ?

## 2022-02-14 ENCOUNTER — Encounter: Payer: Self-pay | Admitting: Family Medicine

## 2022-02-14 ENCOUNTER — Telehealth (INDEPENDENT_AMBULATORY_CARE_PROVIDER_SITE_OTHER): Payer: No Typology Code available for payment source | Admitting: Family Medicine

## 2022-02-14 DIAGNOSIS — J0100 Acute maxillary sinusitis, unspecified: Secondary | ICD-10-CM

## 2022-02-14 MED ORDER — AMOXICILLIN-POT CLAVULANATE 875-125 MG PO TABS
1.0000 | ORAL_TABLET | Freq: Two times a day (BID) | ORAL | 0 refills | Status: DC
Start: 2022-02-14 — End: 2022-03-14

## 2022-02-14 NOTE — Progress Notes (Signed)
Subjective:    Patient ID: [name], female    DOB: [birth year], 56 y.o.   MRN: [id number]  HPI Virtual Visit via Video Note  I connected with the patient on 02/14/22 at  2:30 PM EST by a video enabled telemedicine application and verified that I am speaking with the correct person using two identifiers.  Location patient: home Location provider:work or home office Persons participating in the virtual visit: patient, provider  I discussed the limitations of evaluation and management by telemedicine and the availability of in person appointments. The patient expressed understanding and agreed to proceed.   HPI: Here for 3 days of swelling and pain in the left cheek area. No pain with chewing. Her sinuses feel congested and she has a lot of PND. No fever or cough.    ROS: See pertinent positives and negatives per HPI.  Past Medical History:  Diagnosis Date   Allergy    Anxiety    Depression    GERD (gastroesophageal reflux disease)    History of abnormal cervical Pap smear    1991 -- 1995--hx cryotherapy to cervix   History of colon polyps    2008- BENIGN   Hydronephrosis, left    Kidney stones 2016   PONV (postoperative nausea and vomiting)     Past Surgical History:  Procedure Laterality Date   ABDOMINAL HYSTERECTOMY     ANTERIOR AND POSTERIOR REPAIR WITH SACROSPINOUS FIXATION N/A 08/16/2015   Procedure: ANTERIOR COLPORRHAPHY WITH XENOFORM GRAFT AND SACROSPINOUS FIXATION;  Surgeon: Nunzio Cobbs, MD;  Location: [facility];  Service: Gynecology;  Laterality: N/A;  2.5 hours OR time   BLADDER SUSPENSION N/A 08/16/2015   Procedure: TRANSVAGINAL TAPE (TVT) PROCEDURE exact midurethral sling;  Surgeon: Nunzio Cobbs, MD;  Location: [facility];  Service: Gynecology;  Laterality: N/A;   COLONOSCOPY  08/21/2017   per Dr. Ardis Hughs, clear, repeat in 10 yrs   CYSTOSCOPY N/A 08/16/2015   Procedure: CYSTOSCOPY;  Surgeon: Nunzio Cobbs, MD;   Location: [facility];  Service: Gynecology;  Laterality: N/A;   CYSTOSCOPY W/ RETROGRADES Bilateral 06/22/2015   Procedure: CYSTOSCOPY WITH RETROGRADE PYELOGRAM;  Surgeon: Cleon Gustin, MD;  Location: [facility];  Service: Urology;  Laterality: Bilateral;   CYSTOSCOPY W/ URETERAL STENT PLACEMENT  01/1999   CYSTOSCOPY WITH HOLMIUM LASER LITHOTRIPSY Left 05/18/2015   Procedure: CYSTOSCOPY WITH HOLMIUM LASER LITHOTRIPSY;  Surgeon: Alexis Frock, MD;  Location[facility];  Service: Urology;  Laterality: Left;   CYSTOSCOPY WITH RETROGRADE PYELOGRAM, URETEROSCOPY AND STENT PLACEMENT Left 06/22/2015   Procedure: CYSTOSCOPY,  LEFT URETEROSCOPY;  Surgeon: Cleon Gustin, MD;  Location: [facility];  Service: Urology;  Laterality: Left;   CYSTOSCOPY WITH URETEROSCOPY AND STENT PLACEMENT Left 05/18/2015   Procedure: CYSTOSCOPY WITH URETEROSCOPY, STONE MANIPULATION AND STENT PLACEMENT;  Surgeon: Alexis Frock, MD;  Location: [facility];  Service: Urology;  Laterality: Left;   DIAGNOSTIC LAPAROSCOPY     DX LAPAROSCOPY  X2   ESOPHAGOGASTRODUODENOSCOPY  05/09/2007   EXPLORATORY LAPAROTOMY W/ BILATERAL SALPINGECTOMY AND REMOVAL RIGHT ECTOPIC PREG.  02/23/2004   EXTRACORPOREAL SHOCK WAVE LITHOTRIPSY  2001  &  2002   LAPAROSCOPIC CHOLECYSTECTOMY  1999   POLYPECTOMY     SHOULDER ARTHROSCOPY WITH OPEN ROTATOR CUFF REPAIR Right 2012   TOTAL ABDOMINAL HYSTERECTOMY W/ BILATERAL OOPHORECTOMY AND LYSIS ADHESIONS  09/02/2007   TUBAL LIGATION Bilateral 4163   UMBILICAL HERNIA REPAIR  2003   VAGINAL HYSTERECTOMY N/A 08/18/2015   Procedure: Exam under Anesthesia, Excision Xenform Graft, Removal Bilateral Sacrospinous Ligament sutures;  Surgeon: Nunzio Cobbs, MD;  Location: [facility];  Service: Gynecology;  Laterality: N/A;    Family History  Problem Relation Age of Onset   Kidney disease Mother    Hypertension Mother    Colon polyps  Mother    Diabetes Father    Colon polyps Father    Drug abuse Sister    Cancer Paternal Uncle        Pancreas   Pancreatic cancer Paternal Uncle    Heart attack Maternal Grandmother    Breast cancer Paternal Grandmother    Esophageal cancer Neg Hx    Stomach cancer Neg Hx    Rectal cancer Neg Hx    Colon cancer Neg Hx      Current Outpatient Medications:    ALPRAZolam (XANAX) 1 MG tablet, Take 1 tablet (1 mg total) by mouth 2 (two) times daily as needed for anxiety., Disp: 60 tablet, Rfl: 5   amoxicillin-clavulanate (AUGMENTIN) 875-125 MG tablet, Take 1 tablet by mouth 2 (two) times daily., Disp: 20 tablet, Rfl: 0   [START ON 04/09/2022] amphetamine-dextroamphetamine (ADDERALL) 30 MG tablet, Take 1 tablet by mouth 2 (two) times daily., Disp: 60 tablet, Rfl: 0   buPROPion (WELLBUTRIN XL) 300 MG 24 hr tablet, Take 1 tablet (300 mg total) by mouth daily., Disp: 90 tablet, Rfl: 1   doxycycline (VIBRAMYCIN) 100 MG capsule, Take 1 capsule (100 mg total) by mouth daily., Disp: 90 capsule, Rfl: 3   metroNIDAZOLE (METROCREAM) 0.75 % cream, Apply topically 2 (two) times daily., Disp: , Rfl:    Multiple Vitamin (MULTIVITAMIN WITH MINERALS) TABS tablet, Take 1 tablet by mouth daily., Disp: , Rfl:    pramipexole (MIRAPEX) 0.25 MG tablet, Take 1 tablet (0.25 mg total) by mouth at bedtime., Disp: 90 tablet, Rfl: 3   SUMAtriptan (IMITREX) 100 MG tablet, Take 1 tablet (100 mg total) by mouth as needed for migraine. May repeat in 2 hours if headache persists or recurs., Disp: 10 tablet, Rfl: 11  EXAM:  VITALS per patient if applicable:  GENERAL: alert, oriented, appears well and in no acute distress  HEENT: atraumatic, conjunttiva clear, no obvious abnormalities on inspection of external nose and ears  NECK: normal movements of the head and neck  LUNGS: on inspection no signs of respiratory distress, breathing rate appears normal, no obvious gross SOB, gasping or wheezing  CV: no obvious  cyanosis  MS: moves all visible extremities without noticeable abnormality  PSYCH/NEURO: pleasant and cooperative, no obvious depression or anxiety, speech and thought processing grossly intact  ASSESSMENT AND PLAN: Sinusitis, treat with 10 days of Augmentin. Just to be sure, I also asked her to test herself for the Covid virus.  Alysia Penna, MD  Discussed the following assessment and plan:  No diagnosis found.     I discussed the assessment and treatment plan with the patient. The patient was provided an opportunity to ask questions and all were answered. The patient agreed with the plan and demonstrated an understanding of the instructions.   The patient was advised to call back or seek an in-person evaluation if the symptoms worsen or if the condition fails to improve as anticipated.      Review of Systems     Objective:   Physical Exam        Assessment & Plan:

## 2022-02-15 ENCOUNTER — Ambulatory Visit: Payer: No Typology Code available for payment source | Admitting: Psychology

## 2022-02-27 ENCOUNTER — Ambulatory Visit (INDEPENDENT_AMBULATORY_CARE_PROVIDER_SITE_OTHER): Payer: No Typology Code available for payment source | Admitting: Psychology

## 2022-02-27 DIAGNOSIS — F411 Generalized anxiety disorder: Secondary | ICD-10-CM

## 2022-02-27 DIAGNOSIS — F330 Major depressive disorder, recurrent, mild: Secondary | ICD-10-CM

## 2022-02-27 NOTE — Progress Notes (Signed)
[place] Counselor/Therapist Progress Note ? ?Patient ID: [name], MRN: [id number],   ? ?Date: 02/27/2022 ? ?Time Spent: 12:02PM-12:57PM  ? ?Treatment Type: Individual Therapy  Pt is seen for a virtual visit via webex.  Pt joins from her home and counselor from her home office.   ? ?Reported Symptoms: anxiety, worried thoughts.  ? ?Mental Status Exam: ?Appearance:  Well Groomed     ?Behavior: Appropriate  ?Motor: Normal  ?Speech/Language:  Normal Rate  ?Affect: Appropriate  ?Mood: anxious  ?Thought process: normal  ?Thought content:   WNL  ?Sensory/Perceptual disturbances:   WNL  ?Orientation: oriented to person, place, time/date, and situation  ?Attention: Good  ?Concentration: Good  ?Memory: WNL  ?Fund of knowledge:  Good  ?Insight:   Good  ?Judgment:  Good  ?Impulse Control: Good  ? ?Risk Assessment: ?Danger to Self:  No ?Self-injurious Behavior: No ?Danger to Others: No ?Duty to Warn:no ?Physical Aggression / Violence:No  ?Access to Firearms a concern: No  ?Gang Involvement:No  ? ?Subjective: Counselor assessed pt current functioning per pt report.  Processed w/pt anxiety, worried thoughts, pattern of thoughts and past relationships.  Assisted pt in recognizing thoughts as distortions that increasing anxiety.  Assisted pt in naming as anxiety/distortion, challenging w/ facts and reframing.  Pt affect wnl.  Pt reports struggling w/ anxiety and how worries that she is aware aren't reality are impacting relationship.  Pt reported that always has struggled w/ worry and jealousy in relationship.  Pt discussed past relationships that felt chose someone over her- mom and then exboyfriend.  Pt recognized that her insecurity plays into worry as well as past relationship hx.  Pt was able to name distorted thoughts and challenge and reframe w/ counselor assistance.  Pt agrees to writing thought log of worried thoughts and challenges/reframes.    ? ?Interventions: Cognitive Behavioral  Therapy ? ?Diagnosis:Generalized anxiety disorder ? ?Mild episode of recurrent major depressive disorder ([place]) ? ?Plan: Pt to f/u w/ Counseling in 2 weeks.  Pt to f/u w/ PCP as scheduled.  ? ?Individualized Treatment Plan ?Strengths: seeking counseling, enjoys getting nails done, enjoys breeding rottweiler dogs, enjoys the beach, family Sunday dinners  ?Supports: her husband and her mom  ?  ?Goal/Needs for Treatment:  ?In order of importance to patient ?1) cope with anxiety ?2) increase self awareness ?   ?  ?Client Statement of Needs: "Finding me again.  Learning how to control certain emotions.  Find out why something bothers me.  Where is this anxiety coming from and how to Deal with it"    ?  ?Treatment Level: outpatient counseling  ?Symptoms:anxiety, worry, ruminating thoughts, emotional escalations, depressed mood, sleep disturbance  ?Client Treatment Preferences:biweekly counseling and continue medication management w/ PCP  ?  ?Healthcare consumer's goal for treatment: ?  ?Counselor, [name], [facility] will support the patient's ability to achieve the goals identified. Cognitive Behavioral Therapy, Assertive Communication/Conflict Resolution Training, Relaxation Training, ACT, Humanistic and other evidenced-based practices will be used to promote progress towards healthy functioning.  ?  ?Healthcare consumer will: Actively participate in therapy, working towards healthy functioning.  ?   ?*Justification for Continuation/Discontinuation of Goal: R=Revised, O=Ongoing, A=Achieved, D=Discontinued ?  ?Goal 1) Pt will increase coping with life's anxiety and stressors AEB decreased anxiety, decreased emotional escalations, decreased sleep disturbance, increased awareness of thought patterns that impact anxiety. ?Baseline date 02/13/22: Progress towards goal 0; How Often - Daily ?Target Date Goal Was reviewed Status Code Progress towards goal  ?02/13/23        ?         ?         ?  ?  Goal 2)  Increase self awareness  and identifying her likes, values, interest as demonstrated by expressing and communicating in sessions.  ?Baseline date 02/13/22: Progress towards goal 0; How Often - Daily ?  ?Target Date Goal Was reviewed Status Code Progress towards goal/Likert rating  ?02/13/23        ?         ?         ?  ?This plan has been reviewed and created by the following participants:  This plan will be reviewed at least every 12 months. ?Date Behavioral Health Clinician Date Guardian/Patient   ?02/13/22 Collene Massimino [place] [name]                      02/13/22 Verbal consent provided  ?         ? ? [name], [facility] ? ? ? ?

## 2022-02-27 NOTE — Progress Notes (Signed)
? ? ? ? ? ? ? ? ? ? ? ? ? ? ?  Arvella Massingale, LCMHC ?

## 2022-03-08 ENCOUNTER — Ambulatory Visit: Payer: No Typology Code available for payment source | Admitting: Psychology

## 2022-03-12 ENCOUNTER — Encounter: Payer: Self-pay | Admitting: Family Medicine

## 2022-03-12 NOTE — Telephone Encounter (Signed)
That is a good idea. She can try the Vyvanse again ?

## 2022-03-13 ENCOUNTER — Ambulatory Visit: Payer: No Typology Code available for payment source | Admitting: Psychology

## 2022-03-14 ENCOUNTER — Encounter: Payer: Self-pay | Admitting: Family Medicine

## 2022-03-14 ENCOUNTER — Ambulatory Visit: Payer: No Typology Code available for payment source | Admitting: Family Medicine

## 2022-03-14 VITALS — BP 118/82 | Temp 98.0°F | Wt 206.0 lb

## 2022-03-14 DIAGNOSIS — F900 Attention-deficit hyperactivity disorder, predominantly inattentive type: Secondary | ICD-10-CM

## 2022-03-14 DIAGNOSIS — F419 Anxiety disorder, unspecified: Secondary | ICD-10-CM

## 2022-03-14 DIAGNOSIS — F32A Depression, unspecified: Secondary | ICD-10-CM | POA: Diagnosis not present

## 2022-03-14 MED ORDER — AMPHETAMINE-DEXTROAMPHETAMINE 10 MG PO TABS: 10.0000 mg | ORAL_TABLET | Freq: Two times a day (BID) | ORAL | 0 refills | Status: DC

## 2022-03-14 MED ORDER — FLUOXETINE HCL 20 MG PO CAPS: 20.0000 mg | ORAL_CAPSULE | Freq: Every day | ORAL | 3 refills | Status: DC

## 2022-03-14 NOTE — Progress Notes (Signed)
? ?  Subjective:  ? ? Patient ID: [name], female    DOB: 1966-10-01, 56 y.o.   MRN: [id number] ? ?HPI ?Here to follow on anxiety and depression. She started on Wellbutrin 4 months ago when most of her symptoms were related to depression. It seemed to help for a while, but over the past few weeks her anxiety has gotten much worse and now it seems to be the main issue. She says she is constantly edgy and she worries about things. She has trouble sleeping. She has also become more irritable, and she has had several "blow ups" in the past week when she became extremely angry and she threw some things around her house. She now feels guilty about this and she feels ashamed that she could stop the behaviors. She is now seeing [name] for psychotherapy, and they have had 2 sessions so far. [name] thinks this will be very helpful for her over time. We have also been working on her ADHD, and we have slowly increased the dose to 30 mg BID.  ? ? ?Review of Systems  ?Constitutional: Negative.   ?Respiratory: Negative.    ?Cardiovascular: Negative.   ?Neurological: Negative.   ?Psychiatric/Behavioral:  Positive for agitation, behavioral problems, decreased concentration, dysphoric mood and sleep disturbance. Negative for confusion, hallucinations, self-injury and suicidal ideas. The patient is nervous/anxious.   ? ?   ?Objective:  ? Physical Exam ?Constitutional:   ?   General: She is not in acute distress. ?Cardiovascular:  ?   Rate and Rhythm: Normal rate and regular rhythm.  ?   Pulses: Normal pulses.  ?   Heart sounds: Normal heart sounds.  ?Pulmonary:  ?   Effort: Pulmonary effort is normal.  ?   Breath sounds: Normal breath sounds.  ?Neurological:  ?   General: No focal deficit present.  ?   Mental Status: She is alert and oriented to person, place, and time.  ?Psychiatric:     ?   Behavior: Behavior normal.     ?   Thought Content: Thought content normal.  ?   Comments: She is very anxious and tearful    ? ? ? ? ? ?   ?Assessment & Plan:  ?She has been having more trouble dealing with anxiety lately and I think her medications may be contributing to the problem. We agreed to decrease the Adderall back to 10 mg BID. We will stop the Wellbutrin and start on Prozac 20 mg daily. She will follow up with the psychotherapy. She will see is again in 3 weeks . ?Brittany Penna, MD ? ? ?

## 2022-03-15 ENCOUNTER — Telehealth: Payer: Self-pay | Admitting: Family Medicine

## 2022-03-15 NOTE — Telephone Encounter (Signed)
[name] is calling and needs clarification on adderall  ?[place], [place] Phone:  [phone]  ?Fax:  [phone]  ?  ? ?

## 2022-03-15 NOTE — Telephone Encounter (Signed)
Called [facility] attempting to speak to pharmacist no one ever answered.   Lvm with information from prescription, and if more clarification is needed to please call office ?

## 2022-03-26 ENCOUNTER — Ambulatory Visit (INDEPENDENT_AMBULATORY_CARE_PROVIDER_SITE_OTHER): Payer: No Typology Code available for payment source | Admitting: Psychology

## 2022-03-26 DIAGNOSIS — F330 Major depressive disorder, recurrent, mild: Secondary | ICD-10-CM

## 2022-03-26 DIAGNOSIS — F411 Generalized anxiety disorder: Secondary | ICD-10-CM

## 2022-03-26 NOTE — Progress Notes (Addendum)
[place] Counselor/Therapist Progress Note ? ?Patient ID: [name], MRN: [id number],   ? ?Date: 03/26/2022 ? ?Time Spent: 11:01aM-12:00PM  ? ?Treatment Type: Individual Therapy  Pt is seen for a virtual video visit via webex.  Pt joins from her home and counselor from her home office.   ? ?Reported Symptoms: anxiety, paranoia, worried thoughts.  Improved in past week.   ? ?Mental Status Exam: ?Appearance:  Well Groomed     ?Behavior: Appropriate  ?Motor: Normal  ?Speech/Language:  Normal Rate  ?Affect: Appropriate  ?Mood: anxious  ?Thought process: normal  ?Thought content:   WNL  ?Sensory/Perceptual disturbances:   WNL  ?Orientation: oriented to person, place, time/date, and situation  ?Attention: Good  ?Concentration: Good  ?Memory: WNL  ?Fund of knowledge:  Good  ?Insight:   Good  ?Judgment:  Good  ?Impulse Control: Good  ? ?Risk Assessment: ?Danger to Self:  No ?Self-injurious Behavior: No ?Danger to Others: No ?Duty to Warn:no ?Physical Aggression / Violence:No  ?Access to Firearms a concern: No  ?Gang Involvement:No  ? ?Subjective: Counselor assessed pt current functioning per pt report.  Processed w/pt anxiety, worried thoughts, paranoia and escalating interactions w/ husband 2 weeks ago.  Explored improvement in past week.  Discussed potential contributing factors towards improvements.   Assisted pt in recognizing thoughts distortions and how reframing. Discussed self talk and provided psychoeducation about self compassion.  Pt affect wnl.  Pt reported anxiety and paranoia 2 weeks ago w/ relationship and distorted thinking.  Pt reported 2 escalating incidents w/ verbal aggression and feeling out of control- throwing things.  Pt discussed flight from house and children intervening and positive of that.  Pt reported that f/u w/ her prescribing provider and change from Wellbutrin to Prozac and decrease of Adderall from '30mg'$  BID to '10mg'$  BID.  Pt reported she isn't taking Adderall at  all currently and feels improved in past week since med changes.  Pt reports still insecure thoughts and worries but not escalating and consumed by. Pt is able to identify negative self talk/ insecurities.  Pt is utilizing thought journal to assist w/ reframing and not ruminating on.  Pt receptive to resource of [email or web address] shared.     ?Interventions: Cognitive Behavioral Therapy ? ?Diagnosis:Generalized anxiety disorder ? ?Mild episode of recurrent major depressive disorder ([place]) ? ?Plan: Pt to f/u w/ Counseling in 1 weeks.  Pt to f/u w/ PCP as scheduled.  ? ?Individualized Treatment Plan ?Strengths: seeking counseling, enjoys getting nails done, enjoys breeding rottweiler dogs, enjoys the beach, family Sunday dinners  ?Supports: her husband and her mom  ?  ?Goal/Needs for Treatment:  ?In order of importance to patient ?1) cope with anxiety ?2) increase self awareness ?   ?  ?Client Statement of Needs: "Finding me again.  Learning how to control certain emotions.  Find out why something bothers me.  Where is this anxiety coming from and how to Deal with it"    ?  ?Treatment Level: outpatient counseling  ?Symptoms:anxiety, worry, ruminating thoughts, emotional escalations, depressed mood, sleep disturbance  ?Client Treatment Preferences:biweekly counseling and continue medication management w/ PCP ?Increase to weekly counseling on 02/27/22.  ?  ?Healthcare consumer's goal for treatment: ?  ?Counselor, [name], [facility] will support the patient's ability to achieve the goals identified. Cognitive Behavioral Therapy, Assertive Communication/Conflict Resolution Training, Relaxation Training, ACT, Humanistic and other evidenced-based practices will be used to promote progress towards healthy functioning.  ?  ?Healthcare consumer will: Actively participate in  therapy, working towards healthy functioning.  ?   ?*Justification for Continuation/Discontinuation of Goal: R=Revised, O=Ongoing, A=Achieved,  D=Discontinued ?  ?Goal 1) Pt will increase coping with life's anxiety and stressors AEB decreased anxiety, decreased emotional escalations, decreased sleep disturbance, increased awareness of thought patterns that impact anxiety. ?Baseline date 02/13/22: Progress towards goal 0; How Often - Daily ?Target Date Goal Was reviewed Status Code Progress towards goal  ?02/13/23        ?         ?         ?  ?Goal 2)  Increase self awareness and identifying her likes, values, interest as demonstrated by expressing and communicating in sessions.  ?Baseline date 02/13/22: Progress towards goal 0; How Often - Daily ?  ?Target Date Goal Was reviewed Status Code Progress towards goal/Likert rating  ?02/13/23        ?         ?         ?  ?This plan has been reviewed and created by the following participants:  This plan will be reviewed at least every 12 months. ?Date Behavioral Health Clinician Date Guardian/Patient   ?02/13/22 [name]                      02/13/22 Verbal consent provided  ?02/27/22 [name] [name]  02/27/22  Verbal Consent provided  ? ? ?[name] [facility] ? ? ? ? ? ? ? ? ? ? ? ? ? ? ? ? ? [name], [facility] ?

## 2022-04-05 ENCOUNTER — Ambulatory Visit: Payer: No Typology Code available for payment source | Admitting: Family Medicine

## 2022-04-05 ENCOUNTER — Encounter: Payer: Self-pay | Admitting: Family Medicine

## 2022-04-05 VITALS — BP 130/80 | HR 64 | Temp 98.2°F | Wt 187.8 lb

## 2022-04-05 DIAGNOSIS — F900 Attention-deficit hyperactivity disorder, predominantly inattentive type: Secondary | ICD-10-CM

## 2022-04-05 DIAGNOSIS — F419 Anxiety disorder, unspecified: Secondary | ICD-10-CM

## 2022-04-05 DIAGNOSIS — S5011XA Contusion of right forearm, initial encounter: Secondary | ICD-10-CM | POA: Diagnosis not present

## 2022-04-05 DIAGNOSIS — S60221A Contusion of right hand, initial encounter: Secondary | ICD-10-CM | POA: Diagnosis not present

## 2022-04-05 DIAGNOSIS — F32A Depression, unspecified: Secondary | ICD-10-CM
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
## 2022-04-09 ENCOUNTER — Ambulatory Visit (INDEPENDENT_AMBULATORY_CARE_PROVIDER_SITE_OTHER): Payer: No Typology Code available for payment source | Admitting: Psychology

## 2022-04-09 DIAGNOSIS — F411 Generalized anxiety disorder: Secondary | ICD-10-CM | POA: Diagnosis not present

## 2022-04-09 DIAGNOSIS — F330 Major depressive disorder, recurrent, mild: Secondary | ICD-10-CM

## 2022-04-09 NOTE — Progress Notes (Addendum)
[place] Counselor/Therapist Progress Note ? ?Patient ID: [name], MRN: [id number],   ? ?Date: 04/09/2022 ? ?Time Spent: 11:00am-11:51am ? ?Treatment Type: Individual Therapy  Pt is seen for a virtual video visit via webex.  Pt joins from her home and counselor from her home office.   ? ?Reported Symptoms: decreased anxiety, paranoia, worried thoughts in last week   ? ?Mental Status Exam: ?Appearance:  Well Groomed     ?Behavior: Appropriate  ?Motor: Normal  ?Speech/Language:  Normal Rate  ?Affect: Appropriate  ?Mood: normal  ?Thought process: normal  ?Thought content:   WNL  ?Sensory/Perceptual disturbances:   WNL  ?Orientation: oriented to person, place, time/date, and situation  ?Attention: Good  ?Concentration: Good  ?Memory: WNL  ?Fund of knowledge:  Good  ?Insight:   Good  ?Judgment:  Good  ?Impulse Control: Good  ? ?Risk Assessment: ?Danger to Self:  No ?Self-injurious Behavior: No ?Danger to Others: No ?Duty to Warn:no ?Physical Aggression / Violence:No  ?Access to Firearms a concern: No  ?Gang Involvement:No  ? ?Subjective: Counselor assessed pt current functioning per pt report.  Processed w/pt escalation w/ anxiety 2 weeks ago- discussed how coped- support of husband and options if in crisis.   Explored w/pt report of improvement in past week and husband's confirmation.  Discussed use of coping skills w/ recording thoughts and challenging w/ facts.  Discussed use of mindfulness and relaxation training as skill to practice and benefits for.  Explored ways to initiate daily.  Encouraged to f/u w/ prescribing provider if any concerns w/ restart of Adderall.  Pt affect wnl.  Pt reported one incident of anxiety and paranoia 2 weeks ago w/ relationship and distorted thinking.  Pt reported that escalated quickly when confronted and threatened to move out.  Pt husband sought info on crisis services that day from provider and informed would f/u if still emotionally escalated. Pt  reported that was able to deescalate after space to self, talking w/ mom and w/ husband's support.  Pt discussed negative self talk about her response following and recognizing that our of control.  Pt was able to recognize and reframe negative self talk.  Pt reported that no concerns since w/ escalating anxiety, distorted thinking or paranoia. Pt husband w/ pt permission confirmed that pt has been doing well since and no further concerns.  Pt reports she f/u w/ her PCP last week and was restarted on Adderall 10 mg and pt reports taking a couple time w/ out incident.  Pt agrees to f/u w/ PCP w/ any concerns and informed her husband of same.  Pt aware of option for psychiatrist referral if needed and options for more intensive services if needed and crisis services available.  Pt receptive to minfulness and relaxation training and agrees to start w/ daily morning practice.  Pt also identified need for self care time daily that not related to her relationships.    discussed flight from house and children intervening and positive of that.  Pt reported that f/u  ? ?Interventions: Cognitive Behavioral Therapy and Mindfulness Meditation ? ?Diagnosis:Generalized anxiety disorder ? ?Mild episode of recurrent major depressive disorder ([place]) ? ?Plan: Pt to f/u w/ Counseling in 1 weeks.  Plan for mindful breath practice at next session.  Pt to f/u w/ PCP as scheduled.  ? ?Individualized Treatment Plan ?Strengths: seeking counseling, enjoys getting nails done, enjoys breeding rottweiler dogs, enjoys the beach, family Sunday dinners  ?Supports: her husband and her mom  ?  ?  Goal/Needs for Treatment:  ?In order of importance to patient ?1) cope with anxiety ?2) increase self awareness ?   ?  ?Client Statement of Needs: "Finding me again.  Learning how to control certain emotions.  Find out why something bothers me.  Where is this anxiety coming from and how to Deal with it"    ?  ?Treatment Level: outpatient counseling   ?Symptoms:anxiety, worry, ruminating thoughts, emotional escalations, depressed mood, sleep disturbance  ?Client Treatment Preferences:biweekly counseling and continue medication management w/ PCP ?Increase to weekly counseling on 02/27/22.  ?  ?Healthcare consumer's goal for treatment: ?  ?Counselor, [name], [facility] will support the patient's ability to achieve the goals identified. Cognitive Behavioral Therapy, Assertive Communication/Conflict Resolution Training, Relaxation Training, ACT, Humanistic and other evidenced-based practices will be used to promote progress towards healthy functioning.  ?  ?Healthcare consumer will: Actively participate in therapy, working towards healthy functioning.  ?   ?*Justification for Continuation/Discontinuation of Goal: R=Revised, O=Ongoing, A=Achieved, D=Discontinued ?  ?Goal 1) Pt will increase coping with life's anxiety and stressors AEB decreased anxiety, decreased emotional escalations, decreased sleep disturbance, increased awareness of thought patterns that impact anxiety. ?Baseline date 02/13/22: Progress towards goal 0; How Often - Daily ?Target Date Goal Was reviewed Status Code Progress towards goal  ?02/13/23        ?         ?         ?  ?Goal 2)  Increase self awareness and identifying her likes, values, interest as demonstrated by expressing and communicating in sessions.  ?Baseline date 02/13/22: Progress towards goal 0; How Often - Daily ?  ?Target Date Goal Was reviewed Status Code Progress towards goal/Likert rating  ?02/13/23        ?         ?         ?  ?This plan has been reviewed and created by the following participants:  This plan will be reviewed at least every 12 months. ?Date Behavioral Health Clinician Date Guardian/Patient   ?02/13/22 Arthor Gorter [place] [name]                      02/13/22 Verbal consent provided  ?02/27/22 Akemi Overholser [place] [name]  02/27/22  Verbal Consent provided  ? ? ? ? ? ? ? ? [name], [facility] ?

## 2022-04-16 ENCOUNTER — Ambulatory Visit (INDEPENDENT_AMBULATORY_CARE_PROVIDER_SITE_OTHER): Payer: No Typology Code available for payment source | Admitting: Psychology

## 2022-04-16 DIAGNOSIS — F411 Generalized anxiety disorder: Secondary | ICD-10-CM | POA: Diagnosis not present

## 2022-04-16 DIAGNOSIS — F330 Major depressive disorder, recurrent, mild: Secondary | ICD-10-CM | POA: Diagnosis not present

## 2022-04-16 NOTE — Progress Notes (Signed)
[place] Counselor/Therapist Progress Note ? ?Patient ID: [name], MRN: [id number],   ? ?Date: 04/16/2022 ? ?Time Spent: 12:02pm-12:52pm ? ?Treatment Type: Individual Therapy  Pt is seen for a virtual video visit via caregility.  Pt joins from her home and counselor from her home office.   ? ?Reported Symptoms: decreased anxiety, paranoia, and worried thoughts. Practicing relaxation/self care   ? ?Mental Status Exam: ?Appearance:  Well Groomed     ?Behavior: Appropriate  ?Motor: Normal  ?Speech/Language:  Normal Rate  ?Affect: Appropriate  ?Mood: normal  ?Thought process: normal  ?Thought content:   WNL  ?Sensory/Perceptual disturbances:   WNL  ?Orientation: oriented to person, place, time/date, and situation  ?Attention: Good  ?Concentration: Good  ?Memory: WNL  ?Fund of knowledge:  Good  ?Insight:   Good  ?Judgment:  Good  ?Impulse Control: Good  ? ?Risk Assessment: ?Danger to Self:  No ?Self-injurious Behavior: No ?Danger to Others: No ?Duty to Warn:no ?Physical Aggression / Violence:No  ?Access to Firearms a concern: No  ?Gang Involvement:No  ? ?Subjective: Counselor assessed pt current functioning per pt report.  Processed w/pt anxious and worried thoughts and how reframing.  Explored positives w/engagement w/ husband and grandkids.  Discussed use of self care time and importance of continued practice.  Led pt through mindful breath practice and discussed response and how to utilize for self.  Pt affect wnl.  Pt reported no escalations in anxiety or conflicts w/ husband.  Pt reported that will have worried thoughts and paranoid thoughts that emerge but able to reframe.  Pt reported that enjoyed time w/ her grandchildren.  Pt reported stress of her dog medical crisis.  Pt reported that she has been attempting self care/relaxation time some mornings. Pt participated in breath practice and reported feeling relaxed and able to focus on w/ distractions around.  Pt plans for practice  daily and ways to incorporate for self.   ? ?Interventions: Cognitive Behavioral Therapy and Mindfulness Meditation ? ?Diagnosis:Generalized anxiety disorder ? ?Mild episode of recurrent major depressive disorder ([place]) ? ?Plan: Pt to f/u w/ Counseling in 1 week.  Practice mindful/grounding breathe practice.  F/u as scheduled w/ PCP. ? ?Individualized Treatment Plan ?Strengths: seeking counseling, enjoys getting nails done, enjoys breeding rottweiler dogs, enjoys the beach, family Sunday dinners  ?Supports: her husband and her mom  ?  ?Goal/Needs for Treatment:  ?In order of importance to patient ?1) cope with anxiety ?2) increase self awareness ?   ?  ?Client Statement of Needs: "Finding me again.  Learning how to control certain emotions.  Find out why something bothers me.  Where is this anxiety coming from and how to Deal with it"    ?  ?Treatment Level: outpatient counseling  ?Symptoms:anxiety, worry, ruminating thoughts, emotional escalations, depressed mood, sleep disturbance  ?Client Treatment Preferences:biweekly counseling and continue medication management w/ PCP ?Increase to weekly counseling on 02/27/22.  ?  ?Healthcare consumer's goal for treatment: ?  ?Counselor, [name], [facility] will support the patient's ability to achieve the goals identified. Cognitive Behavioral Therapy, Assertive Communication/Conflict Resolution Training, Relaxation Training, ACT, Humanistic and other evidenced-based practices will be used to promote progress towards healthy functioning.  ?  ?Healthcare consumer will: Actively participate in therapy, working towards healthy functioning.  ?   ?*Justification for Continuation/Discontinuation of Goal: R=Revised, O=Ongoing, A=Achieved, D=Discontinued ?  ?Goal 1) Pt will increase coping with life's anxiety and stressors AEB decreased anxiety, decreased emotional escalations, decreased sleep disturbance, increased awareness of  thought patterns that impact anxiety. ?Baseline date  02/13/22: Progress towards goal 0; How Often - Daily ?Target Date Goal Was reviewed Status Code Progress towards goal  ?02/13/23        ?         ?         ?  ?Goal 2)  Increase self awareness and identifying her likes, values, interest as demonstrated by expressing and communicating in sessions.  ?Baseline date 02/13/22: Progress towards goal 0; How Often - Daily ?  ?Target Date Goal Was reviewed Status Code Progress towards goal/Likert rating  ?02/13/23        ?         ?         ?  ?This plan has been reviewed and created by the following participants:  This plan will be reviewed at least every 12 months. ?Date Behavioral Health Clinician Date Guardian/Patient   ?02/13/22 Daune Colgate [place] [name]                      02/13/22 Verbal consent provided  ?02/27/22 Weslyn Holsonback [place] [name]  02/27/22  Verbal Consent provided  ? ? ? ? ? ? ? ? ?[name], [facility] ? ? ? ? ? ? ? ? ? ? ? ? ? ? [name], [facility] ?

## 2022-04-23 ENCOUNTER — Ambulatory Visit (INDEPENDENT_AMBULATORY_CARE_PROVIDER_SITE_OTHER): Payer: No Typology Code available for payment source | Admitting: Psychology

## 2022-04-23 DIAGNOSIS — F330 Major depressive disorder, recurrent, mild: Secondary | ICD-10-CM

## 2022-04-23 DIAGNOSIS — F411 Generalized anxiety disorder: Secondary | ICD-10-CM

## 2022-04-23 NOTE — Progress Notes (Signed)
[place] Counselor/Therapist Progress Note ? ?Patient ID: [name], MRN: [id number],   ? ?Date: 04/23/2022 ? ?Time Spent: 12:01pm-12:48pm ? ?Treatment Type: Individual Therapy  Pt is seen for a virtual video visit via caregility.  Pt joins from her home and counselor from her home office.   ? ?Reported Symptoms: decreased anxiety, paranoia, and worried thoughts. Practicing relaxation/self care   ? ?Mental Status Exam: ?Appearance:  Well Groomed     ?Behavior: Appropriate  ?Motor: Normal  ?Speech/Language:  Normal Rate  ?Affect: Appropriate  ?Mood: normal  ?Thought process: normal  ?Thought content:   WNL  ?Sensory/Perceptual disturbances:   WNL  ?Orientation: oriented to person, place, time/date, and situation  ?Attention: Good  ?Concentration: Good  ?Memory: WNL  ?Fund of knowledge:  Good  ?Insight:   Good  ?Judgment:  Good  ?Impulse Control: Good  ? ?Risk Assessment: ?Danger to Self:  No ?Self-injurious Behavior: No ?Danger to Others: No ?Duty to Warn:no ?Physical Aggression / Violence:No  ?Access to Firearms a concern: No  ?Gang Involvement:No  ? ?Subjective: Counselor assessed pt current functioning per pt report.  Processed w/pt stressors and positives in past week.  Explored self care that has been positive and ways to continue making daily intentions.  Processed distorted thoughts and assisted in reframing.   Discussed how practiced distress tolerance over weekend.  Pt affect wnl.  Pt reported no escalations in anxiety or conflicts w/ husband.  Pt reported stress w/ job not paying bonus/commission as promised.  Pt feels that he has been able to cope w/ that better than would have couple weeks ago.  Pt discussed worries that emerged when hearing husband get off social media.  Pt discussed how she was able to reframe responding to emotion and further escalating.  Pt reported that didn't have to address w/ husband and worry subsided.    ? ?Interventions: Cognitive Behavioral Therapy  and Mindfulness Meditation ? ?Diagnosis:Generalized anxiety disorder ? ?Mild episode of recurrent major depressive disorder ([place]) ? ?Plan: Pt to f/u w/ Counseling in 1 week.  Practice mindful/grounding breathe practice.  F/u as scheduled w/ PCP. ? ?Individualized Treatment Plan ?Strengths: seeking counseling, enjoys getting nails done, enjoys breeding rottweiler dogs, enjoys the beach, family Sunday dinners  ?Supports: her husband and her mom  ?  ?Goal/Needs for Treatment:  ?In order of importance to patient ?1) cope with anxiety ?2) increase self awareness ?   ?  ?Client Statement of Needs: "Finding me again.  Learning how to control certain emotions.  Find out why something bothers me.  Where is this anxiety coming from and how to Deal with it"    ?  ?Treatment Level: outpatient counseling  ?Symptoms:anxiety, worry, ruminating thoughts, emotional escalations, depressed mood, sleep disturbance  ?Client Treatment Preferences:biweekly counseling and continue medication management w/ PCP ?Increase to weekly counseling on 02/27/22.  ?  ?Healthcare consumer's goal for treatment: ?  ?Counselor, [name], [facility] will support the patient's ability to achieve the goals identified. Cognitive Behavioral Therapy, Assertive Communication/Conflict Resolution Training, Relaxation Training, ACT, Humanistic and other evidenced-based practices will be used to promote progress towards healthy functioning.  ?  ?Healthcare consumer will: Actively participate in therapy, working towards healthy functioning.  ?   ?*Justification for Continuation/Discontinuation of Goal: R=Revised, O=Ongoing, A=Achieved, D=Discontinued ?  ?Goal 1) Pt will increase coping with life's anxiety and stressors AEB decreased anxiety, decreased emotional escalations, decreased sleep disturbance, increased awareness of thought patterns that impact anxiety. ?Baseline date 02/13/22: Progress towards  goal 0; How Often - Daily ?Target Date Goal Was reviewed Status  Code Progress towards goal  ?02/13/23        ?         ?         ?  ?Goal 2)  Increase self awareness and identifying her likes, values, interest as demonstrated by expressing and communicating in sessions.  ?Baseline date 02/13/22: Progress towards goal 0; How Often - Daily ?  ?Target Date Goal Was reviewed Status Code Progress towards goal/Likert rating  ?02/13/23        ?         ?         ?  ?This plan has been reviewed and created by the following participants:  This plan will be reviewed at least every 12 months. ?Date Behavioral Health Clinician Date Guardian/Patient   ?02/13/22 [name] [place] [name]                      02/13/22 Verbal consent provided  ?02/27/22 [name] [place] [name]  02/27/22  Verbal Consent provided  ? ? ? ? ? ? [name], [facility] ?

## 2022-04-30 ENCOUNTER — Ambulatory Visit (INDEPENDENT_AMBULATORY_CARE_PROVIDER_SITE_OTHER): Payer: No Typology Code available for payment source | Admitting: Psychology

## 2022-04-30 DIAGNOSIS — F411 Generalized anxiety disorder: Secondary | ICD-10-CM

## 2022-04-30 DIAGNOSIS — F330 Major depressive disorder, recurrent, mild: Secondary | ICD-10-CM

## 2022-04-30 NOTE — Progress Notes (Signed)
[place] Counselor/Therapist Progress Note ? ?Patient ID: [name], MRN: [id number],   ? ?Date: 04/30/2022 ? ?Time Spent: 2:32pm-3:26pm ? ?Treatment Type: Individual Therapy  Pt is seen for a virtual video visit via caregility.  Pt joins from her home and counselor from her home office.   ? ?Reported Symptoms: decreased anxiety, paranoia, and worried thoughts. Practicing relaxation/self care.   ? ?Mental Status Exam: ?Appearance:  Well Groomed     ?Behavior: Appropriate  ?Motor: Normal  ?Speech/Language:  Normal Rate  ?Affect: Appropriate  ?Mood: normal  ?Thought process: normal  ?Thought content:   WNL  ?Sensory/Perceptual disturbances:   WNL  ?Orientation: oriented to person, place, time/date, and situation  ?Attention: Good  ?Concentration: Good  ?Memory: WNL  ?Fund of knowledge:  Good  ?Insight:   Good  ?Judgment:  Good  ?Impulse Control: Good  ? ?Risk Assessment: ?Danger to Self:  No ?Self-injurious Behavior: No ?Danger to Others: No ?Duty to Warn:no ?Physical Aggression / Violence:No  ?Access to Firearms a concern: No  ?Gang Involvement:No  ? ?Subjective: Counselor assessed pt current functioning per pt report.  Processed w/pt interactions, anxiety and cognitive distortions.  Reflected how pt was able to pause and challenge and reframe distortions. Explored negative self talk and giving self grace.  Discussed pt values and living consistent with.  Pt affect wnl.  Pt reported no escalating interactions.  Pt did report distortions when female verbalized support to husband's business in online group.  Pt was able to recognize distortion, challenge and reframe.  Pt discussed how did struggle w/ some ruminating on and shame for having thoughts.  Pt was able to reframe and encourage self.  Pt discussed values of relationships, loyalty and being dependable.  Pt discussed positives for her and husband.  Pt discussed some upcoming stressors.  Pt discussed exploring her interests and  direction for self. .  ? ?Interventions: Cognitive Behavioral Therapy and Mindfulness Meditation ? ?Diagnosis:Generalized anxiety disorder ? ?Mild episode of recurrent major depressive disorder ([place]) ? ?Plan: Pt to f/u w/ Counseling in 1 week.  Practice mindful/grounding breathe practice.  F/u as scheduled w/ PCP. ? ?Individualized Treatment Plan ?Strengths: seeking counseling, enjoys getting nails done, enjoys breeding rottweiler dogs, enjoys the beach, family Sunday dinners  ?Supports: her husband and her mom  ?  ?Goal/Needs for Treatment:  ?In order of importance to patient ?1) cope with anxiety ?2) increase self awareness ?   ?  ?Client Statement of Needs: "Finding me again.  Learning how to control certain emotions.  Find out why something bothers me.  Where is this anxiety coming from and how to Deal with it"    ?  ?Treatment Level: outpatient counseling  ?Symptoms:anxiety, worry, ruminating thoughts, emotional escalations, depressed mood, sleep disturbance  ?Client Treatment Preferences:biweekly counseling and continue medication management w/ PCP ?Increase to weekly counseling on 02/27/22.  ?  ?Healthcare consumer's goal for treatment: ?  ?Counselor, [name], [facility] will support the patient's ability to achieve the goals identified. Cognitive Behavioral Therapy, Assertive Communication/Conflict Resolution Training, Relaxation Training, ACT, Humanistic and other evidenced-based practices will be used to promote progress towards healthy functioning.  ?  ?Healthcare consumer will: Actively participate in therapy, working towards healthy functioning.  ?   ?*Justification for Continuation/Discontinuation of Goal: R=Revised, O=Ongoing, A=Achieved, D=Discontinued ?  ?Goal 1) Pt will increase coping with life's anxiety and stressors AEB decreased anxiety, decreased emotional escalations, decreased sleep disturbance, increased awareness of thought patterns that impact anxiety. ?Baseline date 02/13/22:  Progress  towards goal 0; How Often - Daily ?Target Date Goal Was reviewed Status Code Progress towards goal  ?02/13/23        ?         ?         ?  ?Goal 2)  Increase self awareness and identifying her likes, values, interest as demonstrated by expressing and communicating in sessions.  ?Baseline date 02/13/22: Progress towards goal 0; How Often - Daily ?  ?Target Date Goal Was reviewed Status Code Progress towards goal/Likert rating  ?02/13/23        ?         ?         ?  ?This plan has been reviewed and created by the following participants:  This plan will be reviewed at least every 12 months. ?Date Behavioral Health Clinician Date Guardian/Patient   ?02/13/22 [name] [name] [name]                      02/13/22 Verbal consent provided  ?02/27/22 Elizardo Chilson [place] [name]  02/27/22  Verbal Consent provided  ? ? ? ? ? ? ?[name], [facility] ? ? ? ? ? ? ? ? ? ? ? ? ? ? [name], [facility] ?

## 2022-05-01 ENCOUNTER — Other Ambulatory Visit: Payer: Self-pay | Admitting: Family Medicine

## 2022-05-01 MED ORDER — AMPHETAMINE-DEXTROAMPHETAMINE 10 MG PO TABS: 10.0000 mg | ORAL_TABLET | Freq: Two times a day (BID) | ORAL | 0 refills | Status: DC

## 2022-05-01 NOTE — Telephone Encounter (Signed)
Pt LOV was on 04/05/2022 ?Last refill done on 03/14/2022 ?Please advise ?

## 2022-05-01 NOTE — Telephone Encounter (Signed)
Done

## 2022-05-07 ENCOUNTER — Ambulatory Visit (INDEPENDENT_AMBULATORY_CARE_PROVIDER_SITE_OTHER): Payer: No Typology Code available for payment source | Admitting: Psychology

## 2022-05-07 DIAGNOSIS — F330 Major depressive disorder, recurrent, mild: Secondary | ICD-10-CM

## 2022-05-07 DIAGNOSIS — F411 Generalized anxiety disorder: Secondary | ICD-10-CM

## 2022-05-07 NOTE — Progress Notes (Signed)
[place] Counselor/Therapist Progress Note ? ?Patient ID: [name], MRN: [id number],   ? ?Date: 05/07/2022 ? ?Time Spent: 12:00pm-12:51pm ? ?Treatment Type: Individual Therapy  Pt is seen for a virtual video visit via caregility.  Pt joins from her home and counselor from her home office.   ? ?Reported Symptoms: decreased anxiety and worried thoughts. Increased practice of relaxation/self care.   ? ?Mental Status Exam: ?Appearance:  Well Groomed     ?Behavior: Appropriate  ?Motor: Normal  ?Speech/Language:  Normal Rate  ?Affect: Appropriate  ?Mood: normal  ?Thought process: normal  ?Thought content:   WNL  ?Sensory/Perceptual disturbances:   WNL  ?Orientation: oriented to person, place, time/date, and situation  ?Attention: Good  ?Concentration: Good  ?Memory: WNL  ?Fund of knowledge:  Good  ?Insight:   Good  ?Judgment:  Good  ?Impulse Control: Good  ? ?Risk Assessment: ?Danger to Self:  No ?Self-injurious Behavior: No ?Danger to Others: No ?Duty to Warn:no ?Physical Aggression / Violence:No  ?Access to Firearms a concern: No  ?Gang Involvement:No  ? ?Subjective: Counselor assessed pt current functioning per pt report.  Processed w/pt interactions, anxiety and cognitive distortions.  Reflected positive practice of self care/relaxation time and discussed how benefiting.  Explored mindful practice of bringing back to present when distortion or rumination arises.  Discussed healthy boundaries establishing w/ upcoming family reunion and how focused on what is in her control. Pt affect wnl.  Pt reported no escalating interactions.  Pt did decreased worry and jealous thoughts.  Pt reported recognizing when needs to recharge for self and seeking daily self care and reports positive impact.  Pt reporting how redirecting self to present focus.  Pt discussed setting boundary re: no one staying at house for reunion and how husband in agreement.  Pt discussed focus on what is in her control and  acknowledging it's time limited.  ? ?Interventions: Cognitive Behavioral Therapy and Mindfulness Meditation ? ?Diagnosis:Generalized anxiety disorder ? ?Mild episode of recurrent major depressive disorder ([place]) ? ?Plan: Pt to f/u w/ Counseling in 1 week. Then shift to biweekly. Practice mindful/grounding breathe practice.  F/u as scheduled w/ PCP. ? ?Individualized Treatment Plan ?Strengths: seeking counseling, enjoys getting nails done, enjoys breeding rottweiler dogs, enjoys the beach, family Sunday dinners  ?Supports: her husband and her mom  ?  ?Goal/Needs for Treatment:  ?In order of importance to patient ?1) cope with anxiety ?2) increase self awareness ?   ?  ?Client Statement of Needs: "Finding me again.  Learning how to control certain emotions.  Find out why something bothers me.  Where is this anxiety coming from and how to Deal with it"    ?  ?Treatment Level: outpatient counseling  ?Symptoms:anxiety, worry, ruminating thoughts, emotional escalations, depressed mood, sleep disturbance  ?Client Treatment Preferences:biweekly counseling and continue medication management w/ PCP ?Increase to weekly counseling on 02/27/22.  ?  ?Healthcare consumer's goal for treatment: ?  ?Counselor, [name], [facility] will support the patient's ability to achieve the goals identified. Cognitive Behavioral Therapy, Assertive Communication/Conflict Resolution Training, Relaxation Training, ACT, Humanistic and other evidenced-based practices will be used to promote progress towards healthy functioning.  ?  ?Healthcare consumer will: Actively participate in therapy, working towards healthy functioning.  ?   ?*Justification for Continuation/Discontinuation of Goal: R=Revised, O=Ongoing, A=Achieved, D=Discontinued ?  ?Goal 1) Pt will increase coping with life's anxiety and stressors AEB decreased anxiety, decreased emotional escalations, decreased sleep disturbance, increased awareness of thought patterns that impact  anxiety. ?Baseline date 02/13/22: Progress towards goal 0; How Often - Daily ?Target Date Goal Was reviewed Status Code Progress towards goal  ?02/13/23        ?         ?         ?  ?Goal 2)  Increase self awareness and identifying her likes, values, interest as demonstrated by expressing and communicating in sessions.  ?Baseline date 02/13/22: Progress towards goal 0; How Often - Daily ?  ?Target Date Goal Was reviewed Status Code Progress towards goal/Likert rating  ?02/13/23        ?         ?         ?  ?This plan has been reviewed and created by the following participants:  This plan will be reviewed at least every 12 months. ?Date Behavioral Health Clinician Date Guardian/Patient   ?02/13/22 [name] [facility]                      02/13/22 Verbal consent provided  ?02/27/22 [name] [place] [name] [facility]  02/27/22  Verbal Consent provided  ? ? ? ? ? [name], [facility] ?

## 2022-05-14 ENCOUNTER — Ambulatory Visit (INDEPENDENT_AMBULATORY_CARE_PROVIDER_SITE_OTHER): Payer: No Typology Code available for payment source | Admitting: Psychology

## 2022-05-14 DIAGNOSIS — F330 Major depressive disorder, recurrent, mild: Secondary | ICD-10-CM

## 2022-05-14 DIAGNOSIS — F411 Generalized anxiety disorder: Secondary | ICD-10-CM
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
## 2022-05-22 ENCOUNTER — Ambulatory Visit: Payer: No Typology Code available for payment source | Admitting: Psychology

## 2022-05-25 ENCOUNTER — Encounter: Payer: 59 | Admitting: Obstetrics and Gynecology

## 2022-05-28 ENCOUNTER — Ambulatory Visit (INDEPENDENT_AMBULATORY_CARE_PROVIDER_SITE_OTHER): Payer: No Typology Code available for payment source | Admitting: Psychology

## 2022-05-28 DIAGNOSIS — F411 Generalized anxiety disorder: Secondary | ICD-10-CM

## 2022-05-28 DIAGNOSIS — F330 Major depressive disorder, recurrent, mild: Secondary | ICD-10-CM | POA: Diagnosis not present

## 2022-05-28 NOTE — Progress Notes (Signed)
[place] Counselor/Therapist Progress Note  Patient ID: [name], MRN: [id number],    Date: 05/28/2022  Time Spent: 12:10pm-12:54pm  Treatment Type: Individual Therapy  Pt is seen for a virtual video visit via caregility.  Pt joins from her home and counselor from her home office.     Reported Symptoms: decreased anxiety and worried thoughts. Increased practice of relaxation/self care.  Increased coping through w/ jealous feelings.  Mental Status Exam: Appearance:  Well Groomed     Behavior: Appropriate  Motor: Normal  Speech/Language:  Normal Rate  Affect: Appropriate  Mood: normal  Thought process: normal  Thought content:   WNL  Sensory/Perceptual disturbances:   WNL  Orientation: oriented to person, place, time/date, and situation  Attention: Good  Concentration: Good  Memory: WNL  Fund of knowledge:  Good  Insight:   Good  Judgment:  Good  Impulse Control: Good   Risk Assessment: Danger to Self:  No Self-injurious Behavior: No Danger to Others: No Duty to Warn:no Physical Aggression / Violence:No  Access to Firearms a concern: No  Gang Involvement:No   Subjective: Counselor assessed pt current functioning per pt report.  Processed w/pt positive and stressors.  Discussed recent jealous/guilty thoughts and feelings that emerged in interactions w/ husband.  Reflected how pt identified her thoughts feelings, recognized distortions and coped through instead of escalating conflict.  Assisted pt in taking self compassion response and recognizing growth.  Explored communicating w/ husband.  Pt affect wnl.  Pt reported that overall reduced anxiety and mood good.  Pt reported over weekend jealous thought that emerged how recognized distortion and communicated concerns and instead of further excalating was able to walk away and self sooth.  Pt reported dealt w/ some guilt but was able to reframe and cope through some tension.  Pt excited about upcoming  positives w/ new puppy and exploring showing.  Interventions: Cognitive Behavioral Therapy and Mindfulness Meditation  Diagnosis:Generalized anxiety disorder  Mild episode of recurrent major depressive disorder ([place])  Plan: Pt to f/u w/ Counseling in 2 weeks. Practice mindful/grounding breathe practice.  F/u as scheduled w/ PCP.  Individualized Treatment Plan Strengths: seeking counseling, enjoys getting nails done, enjoys breeding rottweiler dogs, enjoys the beach, family Sunday dinners  Supports: her husband and her mom    Goal/Needs for Treatment:  In order of importance to patient 1) cope with anxiety 2) increase self awareness      Client Statement of Needs: "Finding me again.  Learning how to control certain emotions.  Find out why something bothers me.  Where is this anxiety coming from and how to Deal with it"      Treatment Level: outpatient counseling  Symptoms:anxiety, worry, ruminating thoughts, emotional escalations, depressed mood, sleep disturbance  Client Treatment Preferences:biweekly counseling and continue medication management w/ PCP Increase to weekly counseling on 02/27/22.    Healthcare consumer's goal for treatment:   Counselor, [name], [facility] will support the patient's ability to achieve the goals identified. Cognitive Behavioral Therapy, Assertive Communication/Conflict Resolution Training, Relaxation Training, ACT, Humanistic and other evidenced-based practices will be used to promote progress towards healthy functioning.    Healthcare consumer will: Actively participate in therapy, working towards healthy functioning.     *Justification for Continuation/Discontinuation of Goal: R=Revised, O=Ongoing, A=Achieved, D=Discontinued   Goal 1) Pt will increase coping with life's anxiety and stressors AEB decreased anxiety, decreased emotional escalations, decreased sleep disturbance, increased awareness of thought patterns that impact anxiety. Baseline date  02/13/22: Progress towards  goal 0; How Often - Daily Target Date Goal Was reviewed Status Code Progress towards goal  02/13/23                            Goal 2)  Increase self awareness and identifying her likes, values, interest as demonstrated by expressing and communicating in sessions.  Baseline date 02/13/22: Progress towards goal 0; How Often - Daily   Target Date Goal Was reviewed Status Code Progress towards goal/Likert rating  02/13/23                            This plan has been reviewed and created by the following participants:  This plan will be reviewed at least every 12 months. Date Behavioral Health Clinician Date Guardian/Patient   02/13/22 [name]                      02/13/22 Verbal consent provided  02/27/22 [name] [name]  02/27/22  Verbal Consent provided     [name], [facility]

## 2022-06-08 ENCOUNTER — Emergency Department: Payer: No Typology Code available for payment source

## 2022-06-08 ENCOUNTER — Encounter: Payer: Self-pay | Admitting: Obstetrics and Gynecology

## 2022-06-08 ENCOUNTER — Other Ambulatory Visit: Payer: Self-pay

## 2022-06-08 ENCOUNTER — Emergency Department
Admission: EM | Admit: 2022-06-08 | Discharge: 2022-06-08 | Disposition: A | Discharge status: Home / Self Care | Payer: No Typology Code available for payment source | Attending: Emergency Medicine | Admitting: Emergency Medicine

## 2022-06-08 DIAGNOSIS — N8110 Cystocele, unspecified: Secondary | ICD-10-CM | POA: Insufficient documentation

## 2022-06-08 LAB — COMPREHENSIVE METABOLIC PANEL
ALT: 17 U/L (ref 0–44)
AST: 20 U/L (ref 15–41)
Albumin: 4.5 g/dL (ref 3.5–5.0)
Alkaline Phosphatase: 58 U/L (ref 38–126)
Anion gap: 10 (ref 5–15)
BUN: 22 mg/dL — ABNORMAL HIGH (ref 6–20)
CO2: 28 mmol/L (ref 22–32)
Calcium: 10 mg/dL (ref 8.9–10.3)
Chloride: 103 mmol/L (ref 98–111)
Creatinine, Ser: 0.78 mg/dL (ref 0.44–1.00)
GFR, Estimated: >60 mL/min (ref >60)
Glucose, Bld: 85 mg/dL (ref 70–99)
Potassium: 3.7 mmol/L (ref 3.5–5.1)
Sodium: 141 mmol/L (ref 135–145)
Total Bilirubin: 0.7 mg/dL (ref 0.3–1.2)
Total Protein: 8 g/dL (ref 6.5–8.1)

## 2022-06-08 LAB — CBC WITH DIFFERENTIAL/PLATELET
Abs Immature Granulocytes: 0.02 10*3/uL (ref 0.00–0.07)
Basophils Absolute: 0 10*3/uL (ref 0.0–0.1)
Basophils Relative: 0 %
Eosinophils Absolute: 0 10*3/uL (ref 0.0–0.5)
Eosinophils Relative: 0 %
HCT: 43.9 % (ref 36.0–46.0)
Hemoglobin: 14.2 g/dL (ref 12.0–15.0)
Immature Granulocytes: 0 %
Lymphocytes Relative: 29 %
Lymphs Abs: 2.1 10*3/uL (ref 0.7–4.0)
MCH: 28.4 pg (ref 26.0–34.0)
MCHC: 32.3 g/dL (ref 30.0–36.0)
MCV: 87.8 fL (ref 80.0–100.0)
Monocytes Absolute: 0.5 10*3/uL (ref 0.1–1.0)
Monocytes Relative: 7 %
Neutro Abs: 4.6 10*3/uL (ref 1.7–7.7)
Neutrophils Relative %: 64 %
Platelets: 221 10*3/uL (ref 150–400)
RBC: 5 MIL/uL (ref 3.87–5.11)
RDW: 13.2 % (ref 11.5–15.5)
WBC: 7.3 10*3/uL (ref 4.0–10.5)
nRBC: 0 % (ref 0.0–0.2)

## 2022-06-08 LAB — URINALYSIS, ROUTINE W REFLEX MICROSCOPIC
Bacteria, UA: NONE SEEN
Bilirubin Urine: NEGATIVE
Glucose, UA: NEGATIVE mg/dL
Ketones, ur: NEGATIVE mg/dL
Leukocytes,Ua: NEGATIVE
Nitrite: NEGATIVE
Protein, ur: NEGATIVE mg/dL
Specific Gravity, Urine: 1.012 (ref 1.005–1.030)
pH: 6 (ref 5.0–8.0)

## 2022-06-08 LAB — LIPASE, BLOOD: Lipase: 34 U/L (ref 11–51)

## 2022-06-08 MED ORDER — ONDANSETRON 4 MG PO TBDP
4.0000 mg | ORAL_TABLET | Freq: Once | ORAL | Status: AC
  Administered 2022-06-08: 4 mg via ORAL
  Filled 2022-06-08: qty 1

## 2022-06-08 MED ORDER — OXYCODONE-ACETAMINOPHEN 5-325 MG PO TABS
1.0000 | ORAL_TABLET | Freq: Once | ORAL | Status: AC
  Administered 2022-06-08: 1 via ORAL
  Filled 2022-06-08: qty 1

## 2022-06-08 NOTE — Discharge Instructions (Addendum)
Please keep your appointment with your gynecologist. You can use coconut oil to keep area moisturized.

## 2022-06-08 NOTE — ED Notes (Signed)
Pt verbalized understanding of discharge instructions and follow-up care instructions. Pt advised if symptoms worsen to return to ED.  

## 2022-06-08 NOTE — ED Triage Notes (Signed)
Pt states she had a bladder prolapse back in August and today she feels like it has happened again- pt did have surgery for the prolapse last time- pt states the pain is worse than it was last time and tried to get in with her OB but they were not in today- pt did have  a hysterectomy back in 2008

## 2022-06-08 NOTE — ED Provider Notes (Signed)
[name] Provider Note  Patient Contact: 5:39 PM (approximate)   History   Bladder Prolapse   HPI  [name] is a 56 y.o. female history of prior bladder prolapse status post repair in 2016 by Gala Romney.  Patient states that she has had a slight prolapse for several months and it is acutely worsened over the past 24 hours patient states that she has developed severe lower abdominal.  Patient denies dysuria or hematuria but does state that she has some pain in her low back which is atypical for her.  No numbness or tingling in the bilateral lower extremities.  No saddle anesthesia or incontinence.      Physical Exam   Triage Vital Signs: ED Triage Vitals  Enc Vitals Group     BP 06/08/22 1548 128/70     Pulse Rate 06/08/22 1548 87     Resp 06/08/22 1548 18     Temp 06/08/22 1548 99.1 F (37.3 C)     Temp Source 06/08/22 1548 Oral     SpO2 06/08/22 1548 98 %     Weight 06/08/22 1552 174 lb (78.9 kg)     Height 06/08/22 1552 '5\' 6"'$  (1.676 m)     Head Circumference --      Peak Flow --      Pain Score 06/08/22 1552 8     Pain Loc --      Pain Edu? --      Excl. in [place]? --     Most recent vital signs: Vitals:   06/08/22 1548  BP: 128/70  Pulse: 87  Resp: 18  Temp: 99.1 F (37.3 C)  SpO2: 98%     General: Alert and in no acute distress. Eyes:  PERRL. EOMI. Head: No acute traumatic findings ENT:      Nose: No congestion/rhinnorhea.      Mouth/Throat: Mucous membranes are moist. Neck: No stridor. No cervical spine tenderness to palpation. Cardiovascular:  Good peripheral perfusion Respiratory: Normal respiratory effort without tachypnea or retractions. Lungs CTAB. Good air entry to the bases with no decreased or absent breath sounds. Gastrointestinal: Bowel sounds 4 quadrants. Soft and nontender to palpation. No guarding or rigidity. No palpable masses. No distention. No CVA tenderness. Genitourinary: Patient has very  small prolapse visualized at the introitus that is easily reducible.  No lacerations of bladder. Musculoskeletal: Full range of motion to all extremities.  Neurologic:  No gross focal neurologic deficits are appreciated.  Skin:   No rash noted Other:   ED Results / Procedures / Treatments   Labs (all labs ordered are listed, but only abnormal results are displayed) Labs Reviewed  COMPREHENSIVE METABOLIC PANEL - Abnormal; Notable for the following components:      Result Value   BUN 22 (*)    All other components within normal limits  URINALYSIS, ROUTINE W REFLEX MICROSCOPIC - Abnormal; Notable for the following components:   Color, Urine YELLOW (*)    APPearance CLEAR (*)    Hgb urine dipstick SMALL (*)    All other components within normal limits  CBC WITH DIFFERENTIAL/PLATELET  LIPASE, BLOOD       RADIOLOGY  I personally viewed and evaluated these images as part of my medical decision making, as well as reviewing the written report by the radiologist.  ED Provider Interpretation: I personally interpreted CT abdomen pelvis and agree with radiologist interpretation.  Patient has small prolapse of bladder but no other concerning findings.  PROCEDURES:  Critical Care performed: No  Procedures   MEDICATIONS ORDERED IN ED: Medications  oxyCODONE-acetaminophen (PERCOCET/ROXICET) 5-325 MG per tablet 1 tablet (1 tablet Oral Given 06/08/22 2013)  ondansetron (ZOFRAN-ODT) disintegrating tablet 4 mg (4 mg Oral Given 06/08/22 2011)     IMPRESSION / MDM / ASSESSMENT AND PLAN / ED COURSE  I reviewed the triage vital signs and the nursing notes.                              Assessment and plan Bladder prolapse, nephrolithiasis, pyelonephritis, UTI...  56 year old female presents to the emergency department with a sensation of vaginal fullness and discomfort.  Vital signs were reassuring at triage.  On exam, patient was alert, active and nontoxic-appearing.  She did have a  small prolapse visualized on physical exam without lacerations.  I obtain CT abdomen pelvis and there was no evidence of acute abnormality aside from small prolapse of bladder confirmed on physical exam.  CBC, CMP, lipase and urinalysis were unremarkable.  I reached out to OB/GYN on-call, Dr. Leafy Ro who recommended keeping the affected area moisturized with coconut oil and Vaseline.  Unfortunately pessaries are not available in the emergency department for placement.  Patient does have an appointment with her gynecologist next week.  Recommended keeping appointment.  Return precautions were given to return with new or worsening symptoms.      FINAL CLINICAL IMPRESSION(S) / ED DIAGNOSES   Final diagnoses:  Bladder prolapse, female, acquired     Rx / DC Orders   ED Discharge Orders     None        Note:  This document was prepared using Dragon voice recognition software and may include unintentional dictation errors.   Vallarie Mare [place], PA-C 06/08/22 2054    Lucrezia Starch, MD 06/09/22 1053

## 2022-06-09 NOTE — Telephone Encounter (Signed)
I see that Villa went to the ER.   How about 06/15/22 for a sooner appointment with me?

## 2022-06-11 ENCOUNTER — Encounter: Payer: Self-pay | Admitting: Family Medicine

## 2022-06-11 ENCOUNTER — Ambulatory Visit (INDEPENDENT_AMBULATORY_CARE_PROVIDER_SITE_OTHER): Payer: No Typology Code available for payment source | Admitting: Psychology

## 2022-06-11 DIAGNOSIS — F330 Major depressive disorder, recurrent, mild: Secondary | ICD-10-CM | POA: Diagnosis not present

## 2022-06-11 DIAGNOSIS — F411 Generalized anxiety disorder: Secondary | ICD-10-CM

## 2022-06-11 NOTE — Telephone Encounter (Signed)
Spoke with patient and she is scheduled on 06/15/22.

## 2022-06-11 NOTE — Progress Notes (Signed)
[place] Counselor/Therapist Progress Note  Patient ID: [name], MRN: [id number],    Date: 06/11/2022  Time Spent: 12:02pm-12:54pm  Treatment Type: Individual Therapy  Pt is seen for a virtual video visit via caregility.  Pt joins from her home and counselor from her home office.     Reported Symptoms: decreased anxiety, some increased worry about parents and losing themm  Mental Status Exam: Appearance:  Well Groomed     Behavior: Appropriate  Motor: Normal  Speech/Language:  Normal Rate  Affect: Appropriate  Mood: normal  Thought process: normal  Thought content:   WNL  Sensory/Perceptual disturbances:   WNL  Orientation: oriented to person, place, time/date, and situation  Attention: Good  Concentration: Good  Memory: WNL  Fund of knowledge:  Good  Insight:   Good  Judgment:  Good  Impulse Control: Good   Risk Assessment: Danger to Self:  No Self-injurious Behavior: No Danger to Others: No Duty to Warn:no Physical Aggression / Violence:No  Access to Firearms a concern: No  Gang Involvement:No   Subjective: Counselor assessed pt current functioning per pt report.  Processed w/pt positive and stressors.  Explored self talk re: sister in laws comments and how pt coped through and reframe for self.  Validated concerns and feelings re: parent aging.  Discussed ways of acknowledging emotions and focus on present and current ways engaging.  Pt affect wnl.  Pt reported that mood has been good and coping through stressors.  Pt reported on comment of sister in law and how initially bothered.  Pt utilized skills and was able to reframe for self.  Pt discussed upcoming visit w/ in laws and acknowledged that will be certain comments and options shared and identified how she wants to respond.  Pt expressed some increased worry about parents aging and worry about coping through future grief.  Pt able to validate her emotions and discuss ways to engage  presently w/ this relationships.    Interventions: Cognitive Behavioral Therapy and Mindfulness Meditation  Diagnosis:Generalized anxiety disorder  Mild episode of recurrent major depressive disorder ([place])  Plan: Pt to f/u w/ Counseling in 2 weeks. Practice mindful/grounding breathe practice.  F/u as scheduled w/ PCP.  Individualized Treatment Plan Strengths: seeking counseling, enjoys getting nails done, enjoys breeding rottweiler dogs, enjoys the beach, family Sunday dinners  Supports: her husband and her mom    Goal/Needs for Treatment:  In order of importance to patient 1) cope with anxiety 2) increase self awareness      Client Statement of Needs: "Finding me again.  Learning how to control certain emotions.  Find out why something bothers me.  Where is this anxiety coming from and how to Deal with it"      Treatment Level: outpatient counseling  Symptoms:anxiety, worry, ruminating thoughts, emotional escalations, depressed mood, sleep disturbance  Client Treatment Preferences:biweekly counseling and continue medication management w/ PCP Increase to weekly counseling on 02/27/22.    Healthcare consumer's goal for treatment:   Counselor, [name], [facility] will support the patient's ability to achieve the goals identified. Cognitive Behavioral Therapy, Assertive Communication/Conflict Resolution Training, Relaxation Training, ACT, Humanistic and other evidenced-based practices will be used to promote progress towards healthy functioning.    Healthcare consumer will: Actively participate in therapy, working towards healthy functioning.     *Justification for Continuation/Discontinuation of Goal: R=Revised, O=Ongoing, A=Achieved, D=Discontinued   Goal 1) Pt will increase coping with life's anxiety and stressors AEB decreased anxiety, decreased emotional escalations, decreased sleep  disturbance, increased awareness of thought patterns that impact anxiety. Baseline date 02/13/22:  Progress towards goal 0; How Often - Daily Target Date Goal Was reviewed Status Code Progress towards goal  02/13/23                            Goal 2)  Increase self awareness and identifying her likes, values, interest as demonstrated by expressing and communicating in sessions.  Baseline date 02/13/22: Progress towards goal 0; How Often - Daily   Target Date Goal Was reviewed Status Code Progress towards goal/Likert rating  02/13/23                            This plan has been reviewed and created by the following participants:  This plan will be reviewed at least every 12 months. Date Behavioral Health Clinician Date Guardian/Patient   02/13/22 [name]                      02/13/22 Verbal consent provided  02/27/22 [name] [place] [name]  02/27/22  Verbal Consent provided            [name] [facility]

## 2022-06-12 NOTE — Telephone Encounter (Signed)
This is nothing to worry about, just a normal variation. Her kidney function is normal

## 2022-06-15 ENCOUNTER — Encounter: Payer: Self-pay | Admitting: Obstetrics and Gynecology

## 2022-06-15 ENCOUNTER — Ambulatory Visit (INDEPENDENT_AMBULATORY_CARE_PROVIDER_SITE_OTHER): Payer: No Typology Code available for payment source | Admitting: Obstetrics and Gynecology

## 2022-06-15 VITALS — BP 124/78 | Ht 65.5 in | Wt 174.0 lb

## 2022-06-15 DIAGNOSIS — R3915 Urgency of urination: Secondary | ICD-10-CM | POA: Diagnosis not present

## 2022-06-15 DIAGNOSIS — R102 Pelvic and perineal pain: Secondary | ICD-10-CM

## 2022-06-15 DIAGNOSIS — Z01419 Encounter for gynecological examination (general) (routine) without abnormal findings: Secondary | ICD-10-CM

## 2022-06-15 LAB — URINALYSIS W MICROSCOPIC + REFLEX CULTURE
Bacteria, UA: NONE SEEN /HPF
Bilirubin Urine: NEGATIVE
Glucose, UA: NEGATIVE
Hyaline Cast: NONE SEEN /LPF
Ketones, ur: NEGATIVE
Leukocyte Esterase: NEGATIVE
Nitrites, Initial: NEGATIVE
Protein, ur: NEGATIVE
RBC / HPF: NONE SEEN /HPF (ref 0–2)
Specific Gravity, Urine: 1.015 (ref 1.001–1.035)
WBC, UA: NONE SEEN /HPF (ref 0–5)
pH: 7 (ref 5.0–8.0)

## 2022-06-15 LAB — NO CULTURE INDICATED

## 2022-06-20 ENCOUNTER — Encounter: Payer: Self-pay | Admitting: Obstetrics and Gynecology

## 2022-06-25 ENCOUNTER — Ambulatory Visit: Payer: No Typology Code available for payment source | Admitting: Psychology

## 2022-07-06 ENCOUNTER — Other Ambulatory Visit: Payer: Self-pay | Admitting: Family Medicine

## 2022-07-09 ENCOUNTER — Ambulatory Visit (INDEPENDENT_AMBULATORY_CARE_PROVIDER_SITE_OTHER): Payer: No Typology Code available for payment source | Admitting: Psychology

## 2022-07-09 DIAGNOSIS — F411 Generalized anxiety disorder: Secondary | ICD-10-CM | POA: Diagnosis not present

## 2022-07-09 DIAGNOSIS — F330 Major depressive disorder, recurrent, mild: Secondary | ICD-10-CM

## 2022-07-09 NOTE — Progress Notes (Signed)
[place] Counselor/Therapist Progress Note  Patient ID: [name], MRN: [id number],    Date: 07/09/2022  Time Spent: 12:00pm-12:50pm  Treatment Type: Individual Therapy  Pt is seen for a virtual video visit via caregility.  Pt joins from her home and counselor from her home office.     Reported Symptoms: decreased anxiety, separation anxiety following mom's visit  Mental Status Exam: Appearance:  Well Groomed     Behavior: Appropriate  Motor: Normal  Speech/Language:  Normal Rate  Affect: Appropriate  Mood: anxious  Thought process: normal  Thought content:   WNL  Sensory/Perceptual disturbances:   WNL  Orientation: oriented to person, place, time/date, and situation  Attention: Good  Concentration: Good  Memory: WNL  Fund of knowledge:  Good  Insight:   Good  Judgment:  Good  Impulse Control: Good   Risk Assessment: Danger to Self:  No Self-injurious Behavior: No Danger to Others: No Duty to Warn:no Physical Aggression / Violence:No  Access to Firearms a concern: No  Gang Involvement:No   Subjective: Counselor assessed pt current functioning per pt report.  Processed w/pt mood, positives and stressors.  Reflected positives w/ recognizing emotions, identifying distortions and reframing and using coping skills.  Validated worries w/ aging parents and discussed emotional deescalation skills. Reiterated not responsible for others feelings or resolving emotions for others.  Pt affect wnl.  Pt reported that she dealt w/ some anxiety and was able to work through, recognize some distortions that exacerbated and reframe.  Pt discussed anxiety that intensified after mom's visit.  Pt acknowledged separation anxiety and how she has experienced this over the years.  Pt reported that impacted sleep for 2 nights and so did take xanax prescribed last night.  Pt reported able to sleep and feels less anxious.  Pt is able to acknowledge coping skills to manage  through to utilize.  Pt discussed themes of trying to resolve and make things better for others/ make everyone happy and recognizing this isn't possible.  Pt discussed book reading that reinforcing skills and concepts discussed.    Interventions: Cognitive Behavioral Therapy and Mindfulness Meditation  Diagnosis:Generalized anxiety disorder  Mild episode of recurrent major depressive disorder ([place])  Plan: Pt to f/u w/ Counseling in 2 weeks. Practice mindful/grounding breathe practice.  F/u as scheduled w/ PCP.  Individualized Treatment Plan Strengths: seeking counseling, enjoys getting nails done, enjoys breeding rottweiler dogs, enjoys the beach, family Sunday dinners  Supports: her husband and her mom    Goal/Needs for Treatment:  In order of importance to patient 1) cope with anxiety 2) increase self awareness      Client Statement of Needs: "Finding me again.  Learning how to control certain emotions.  Find out why something bothers me.  Where is this anxiety coming from and how to Deal with it"      Treatment Level: outpatient counseling  Symptoms:anxiety, worry, ruminating thoughts, emotional escalations, depressed mood, sleep disturbance  Client Treatment Preferences:biweekly counseling and continue medication management w/ PCP Increase to weekly counseling on 02/27/22.    Healthcare consumer's goal for treatment:   Counselor, [name], [facility] will support the patient's ability to achieve the goals identified. Cognitive Behavioral Therapy, Assertive Communication/Conflict Resolution Training, Relaxation Training, ACT, Humanistic and other evidenced-based practices will be used to promote progress towards healthy functioning.    Healthcare consumer will: Actively participate in therapy, working towards healthy functioning.     *Justification for Continuation/Discontinuation of Goal: R=Revised, O=Ongoing, A=Achieved, D=Discontinued  Goal 1) Pt will increase coping with life's  anxiety and stressors AEB decreased anxiety, decreased emotional escalations, decreased sleep disturbance, increased awareness of thought patterns that impact anxiety. Baseline date 02/13/22: Progress towards goal 0; How Often - Daily Target Date Goal Was reviewed Status Code Progress towards goal  02/13/23                            Goal 2)  Increase self awareness and identifying her likes, values, interest as demonstrated by expressing and communicating in sessions.  Baseline date 02/13/22: Progress towards goal 0; How Often - Daily   Target Date Goal Was reviewed Status Code Progress towards goal/Likert rating  02/13/23                            This plan has been reviewed and created by the following participants:  This plan will be reviewed at least every 12 months. Date Behavioral Health Clinician Date Guardian/Patient   02/13/22 [facility] [name] [facility]                      02/13/22 Verbal consent provided  02/27/22 [name] [place] [name] [facility]  02/27/22  Verbal Consent provided         [name] [facility]

## 2022-07-13 ENCOUNTER — Ambulatory Visit (INDEPENDENT_AMBULATORY_CARE_PROVIDER_SITE_OTHER): Payer: No Typology Code available for payment source | Admitting: Family Medicine

## 2022-07-13 ENCOUNTER — Encounter: Payer: Self-pay | Admitting: Family Medicine

## 2022-07-13 VITALS — BP 98/72 | HR 69 | Temp 98.1°F | Ht 65.5 in | Wt 170.5 lb

## 2022-07-13 DIAGNOSIS — Z0000 Encounter for general adult medical examination without abnormal findings: Secondary | ICD-10-CM

## 2022-07-13 LAB — CBC WITH DIFFERENTIAL/PLATELET
Basophils Absolute: 0 10*3/uL (ref 0.0–0.1)
Basophils Relative: 0.4 % (ref 0.0–3.0)
Eosinophils Absolute: 0.1 10*3/uL (ref 0.0–0.7)
Eosinophils Relative: 0.9 % (ref 0.0–5.0)
HCT: 38.5 % (ref 36.0–46.0)
Hemoglobin: 13.1 g/dL (ref 12.0–15.0)
Lymphocytes Relative: 29 % (ref 12.0–46.0)
Lymphs Abs: 1.7 10*3/uL (ref 0.7–4.0)
MCHC: 34.1 g/dL (ref 30.0–36.0)
MCV: 86.8 fl (ref 78.0–100.0)
Monocytes Absolute: 0.5 10*3/uL (ref 0.1–1.0)
Monocytes Relative: 9 % (ref 3.0–12.0)
Neutro Abs: 3.5 10*3/uL (ref 1.4–7.7)
Neutrophils Relative %: 60.7 % (ref 43.0–77.0)
Platelets: 223 10*3/uL (ref 150.0–400.0)
RBC: 4.43 Mil/uL (ref 3.87–5.11)
RDW: 13.9 % (ref 11.5–15.5)
WBC: 5.8 10*3/uL (ref 4.0–10.5)

## 2022-07-13 LAB — HEMOGLOBIN A1C: Hgb A1c MFr Bld: 5.7 % (ref 4.6–6.5)

## 2022-07-13 LAB — BASIC METABOLIC PANEL
BUN: 28 mg/dL — ABNORMAL HIGH (ref 6–23)
CO2: 30 mEq/L (ref 19–32)
Calcium: 9.8 mg/dL (ref 8.4–10.5)
Chloride: 102 mEq/L (ref 96–112)
Creatinine, Ser: 0.73 mg/dL (ref 0.40–1.20)
GFR: 91.76 mL/min (ref >60.00)
Glucose, Bld: 90 mg/dL (ref 70–99)
Potassium: 4.1 mEq/L (ref 3.5–5.1)
Sodium: 140 mEq/L (ref 135–145)

## 2022-07-13 LAB — LIPID PANEL
Cholesterol: 199 mg/dL (ref 0–200)
HDL: 52.6 mg/dL (ref >39.00)
LDL Cholesterol: 127 mg/dL — ABNORMAL HIGH (ref 0–99)
NonHDL: 145.99
Total CHOL/HDL Ratio: 4
Triglycerides: 93 mg/dL (ref 0.0–149.0)
VLDL: 18.6 mg/dL (ref 0.0–40.0)

## 2022-07-13 LAB — HEPATIC FUNCTION PANEL
ALT: 13 U/L (ref 0–35)
AST: 16 U/L (ref 0–37)
Albumin: 4.4 g/dL (ref 3.5–5.2)
Alkaline Phosphatase: 59 U/L (ref 39–117)
Bilirubin, Direct: 0.1 mg/dL (ref 0.0–0.3)
Total Bilirubin: 0.6 mg/dL (ref 0.2–1.2)
Total Protein: 7.3 g/dL (ref 6.0–8.3)

## 2022-07-13 LAB — TSH: TSH: 1.67 u[IU]/mL (ref 0.35–5.50)

## 2022-07-13 MED ORDER — FLUOXETINE HCL 20 MG PO CAPS
20.0000 mg | ORAL_CAPSULE | Freq: Every day | ORAL | 3 refills | Status: DC
Start: 2022-07-13 — End: 2022-11-30

## 2022-07-13 MED ORDER — PRAMIPEXOLE DIHYDROCHLORIDE 0.5 MG PO TABS: 0.5000 mg | ORAL_TABLET | Freq: Two times a day (BID) | ORAL | 3 refills | Status: DC

## 2022-07-13 MED ORDER — METRONIDAZOLE 0.75 % EX CREA
TOPICAL_CREAM | Freq: Two times a day (BID) | CUTANEOUS | 3 refills | Status: DC
Start: 2022-07-13 — End: 2023-09-04

## 2022-07-13 MED ORDER — AMPHETAMINE-DEXTROAMPHETAMINE 10 MG PO TABS: 10.0000 mg | ORAL_TABLET | Freq: Two times a day (BID) | ORAL | 0 refills | Status: DC

## 2022-07-13 MED ORDER — AMPHETAMINE-DEXTROAMPHETAMINE 10 MG PO TABS
10.0000 mg | ORAL_TABLET | Freq: Two times a day (BID) | ORAL | 0 refills | Status: DC
Start: 2022-07-31 — End: 2022-07-13

## 2022-07-13 NOTE — Progress Notes (Signed)
   Subjective:    Patient ID: [name], female    DOB: [birth year], 56 y.o.   MRN: [id number]  HPI Here for a well exam. She feels well in general. She asks to increase the dose of Pramipexole however because she is having some restless leg symptoms during the day as well as during the night. She is pleased with hiw the Prozac and Adderall are working.   Review of Systems  Constitutional: Negative.   HENT: Negative.    Eyes: Negative.   Respiratory: Negative.    Cardiovascular: Negative.   Gastrointestinal: Negative.   Genitourinary:  Negative for decreased urine volume, difficulty urinating, dyspareunia, dysuria, enuresis, flank pain, frequency, hematuria, pelvic pain and urgency.  Musculoskeletal: Negative.   Skin: Negative.   Neurological: Negative.  Negative for headaches.  Psychiatric/Behavioral: Negative.         Objective:   Physical Exam Constitutional:      General: She is not in acute distress.    Appearance: Normal appearance. She is well-developed.  HENT:     Head: Normocephalic and atraumatic.     Right Ear: External ear normal.     Left Ear: External ear normal.     Nose: Nose normal.     Mouth/Throat:     Pharynx: No oropharyngeal exudate.  Eyes:     General: No scleral icterus.    Conjunctiva/sclera: Conjunctivae normal.     Pupils: Pupils are equal, round, and reactive to light.  Neck:     Thyroid: No thyromegaly.     Vascular: No JVD.  Cardiovascular:     Rate and Rhythm: Normal rate and regular rhythm.     Heart sounds: Normal heart sounds. No murmur heard.    No friction rub. No gallop.  Pulmonary:     Effort: Pulmonary effort is normal. No respiratory distress.     Breath sounds: Normal breath sounds. No wheezing or rales.  Chest:     Chest wall: No tenderness.  Abdominal:     General: Bowel sounds are normal. There is no distension.     Palpations: Abdomen is soft. There is no mass.     Tenderness: There is no abdominal  tenderness. There is no guarding or rebound.  Musculoskeletal:        General: No tenderness. Normal range of motion.     Cervical back: Normal range of motion and neck supple.  Lymphadenopathy:     Cervical: No cervical adenopathy.  Skin:    General: Skin is warm and dry.     Findings: No erythema or rash.  Neurological:     Mental Status: She is alert and oriented to person, place, and time.     Cranial Nerves: No cranial nerve deficit.     Motor: No abnormal muscle tone.     Coordination: Coordination normal.     Deep Tendon Reflexes: Reflexes are normal and symmetric. Reflexes normal.  Psychiatric:        Behavior: Behavior normal.        Thought Content: Thought content normal.        Judgment: Judgment normal.           Assessment & Plan:  Well exam. We discussed diet and exercise. Get fasting labs. We will increase the Pramipexole to 0.5 mg BID.  Alysia Penna, MD

## 2022-07-16 ENCOUNTER — Encounter: Payer: Self-pay | Admitting: Family Medicine

## 2022-07-17 NOTE — Telephone Encounter (Signed)
As for the lipids in the diet, she should limit the intake of fried foods, fatty foods, gravies, and dairy products. As for the letter, this is ready to be picked up

## 2022-07-17 NOTE — Telephone Encounter (Signed)
I don't see anything that would cause easy bruising, but let us know if this gets any worse. See my Result Note about the lab results. She should not worry about the BUN being a couple points above "norma". The more important markers of kidney function (the creatinine and the GFR) are excellent. As for the letter about her dog, I need more information. Is this for a work boss, a Gaffer, or whom?

## 2022-07-19 ENCOUNTER — Ambulatory Visit: Payer: No Typology Code available for payment source | Admitting: Psychology

## 2022-08-02 ENCOUNTER — Other Ambulatory Visit: Payer: Self-pay | Admitting: Family Medicine

## 2022-08-02 DIAGNOSIS — Z1231 Encounter for screening mammogram for malignant neoplasm of breast: Secondary | ICD-10-CM

## 2022-08-06 ENCOUNTER — Ambulatory Visit (INDEPENDENT_AMBULATORY_CARE_PROVIDER_SITE_OTHER): Payer: No Typology Code available for payment source | Admitting: Psychology

## 2022-08-06 DIAGNOSIS — F411 Generalized anxiety disorder: Secondary | ICD-10-CM | POA: Diagnosis not present

## 2022-08-06 DIAGNOSIS — F330 Major depressive disorder, recurrent, mild: Secondary | ICD-10-CM | POA: Diagnosis not present

## 2022-08-06 NOTE — Progress Notes (Signed)
[place] Counselor/Therapist Progress Note  Patient ID: [name], MRN: [id number],    Date: 08/06/2022  Time Spent: 12:02pm-12:51pm  Treatment Type: Individual Therapy  Pt is seen for a virtual video visit via caregility.  Pt joins from her home and counselor from her home office.     Reported Symptoms: decreased anxiety overall, some feelings of jealousy and negative self talk.    Mental Status Exam: Appearance:  Well Groomed     Behavior: Appropriate  Motor: Normal  Speech/Language:  Normal Rate  Affect: Appropriate  Mood: anxious  Thought process: normal  Thought content:   WNL  Sensory/Perceptual disturbances:   WNL  Orientation: oriented to person, place, time/date, and situation  Attention: Good  Concentration: Good  Memory: WNL  Fund of knowledge:  Good  Insight:   Good  Judgment:  Good  Impulse Control: Good   Risk Assessment: Danger to Self:  No Self-injurious Behavior: No Danger to Others: No Duty to Warn:no Physical Aggression / Violence:No  Access to Firearms a concern: No  Gang Involvement:No   Subjective: Counselor assessed pt current functioning per pt report.  Processed w/pt mood, stressors and thoughts.  Discussed emotions recognizing, identifying distortions and assisted w/ reframing.  Reiterated not responsible for others feelings and reframing that if someone upset then she has done something wrong or not good enough.  Explored use of coping skills and how to communicate needs.  Pt affect wnl.  Pt reported that she has dealt w/ some times of feeling jealous and insecurity w/ relationship.  Pt acknowledged that she is able to recognize and identify distortions and reframe.  Pt discussed how themes of I'm not good enough come up and able to recognize untrue and continue work of self compassion. Pt at times frustrated as husband will be quick to shut down if expresses emotions.  Pt discussed ways of expressing, supports and coping  to work through these emotions.  Pt discussed books reading that help and how exercise and dog have been positives.     Interventions: Cognitive Behavioral Therapy and Mindfulness Meditation  Diagnosis:Generalized anxiety disorder  Mild episode of recurrent major depressive disorder ([place])  Plan: Pt to f/u w/ Counseling in 2 weeks. Practice mindful/grounding breathe practice.  F/u as scheduled w/ PCP.  Individualized Treatment Plan Strengths: seeking counseling, enjoys getting nails done, enjoys breeding rottweiler dogs, enjoys the beach, family Sunday dinners  Supports: her husband and her mom    Goal/Needs for Treatment:  In order of importance to patient 1) cope with anxiety 2) increase self awareness      Client Statement of Needs: "Finding me again.  Learning how to control certain emotions.  Find out why something bothers me.  Where is this anxiety coming from and how to Deal with it"      Treatment Level: outpatient counseling  Symptoms:anxiety, worry, ruminating thoughts, emotional escalations, depressed mood, sleep disturbance  Client Treatment Preferences:biweekly counseling and continue medication management w/ PCP Increase to weekly counseling on 02/27/22.    Healthcare consumer's goal for treatment:   Counselor, [name], [facility] will support the patient's ability to achieve the goals identified. Cognitive Behavioral Therapy, Assertive Communication/Conflict Resolution Training, Relaxation Training, ACT, Humanistic and other evidenced-based practices will be used to promote progress towards healthy functioning.    Healthcare consumer will: Actively participate in therapy, working towards healthy functioning.     *Justification for Continuation/Discontinuation of Goal: R=Revised, O=Ongoing, A=Achieved, D=Discontinued   Goal 1) Pt will increase  coping with life's anxiety and stressors AEB decreased anxiety, decreased emotional escalations, decreased sleep disturbance,  increased awareness of thought patterns that impact anxiety. Baseline date 02/13/22: Progress towards goal 0; How Often - Daily Target Date Goal Was reviewed Status Code Progress towards goal  02/13/23                            Goal 2)  Increase self awareness and identifying her likes, values, interest as demonstrated by expressing and communicating in sessions.  Baseline date 02/13/22: Progress towards goal 0; How Often - Daily   Target Date Goal Was reviewed Status Code Progress towards goal/Likert rating  02/13/23                            This plan has been reviewed and created by the following participants:  This plan will be reviewed at least every 12 months. Date Behavioral Health Clinician Date Guardian/Patient   02/13/22 [name]                      02/13/22 Verbal consent provided  02/27/22 [name] [place] [name]  02/27/22  Verbal Consent provided       [name] [facility]

## 2022-08-07 ENCOUNTER — Encounter: Payer: Self-pay | Admitting: Obstetrics and Gynecology

## 2022-08-07 ENCOUNTER — Telehealth (INDEPENDENT_AMBULATORY_CARE_PROVIDER_SITE_OTHER): Payer: No Typology Code available for payment source | Admitting: Family Medicine

## 2022-08-07 ENCOUNTER — Encounter: Payer: Self-pay | Admitting: Family Medicine

## 2022-08-07 DIAGNOSIS — R21 Rash and other nonspecific skin eruption: Secondary | ICD-10-CM | POA: Diagnosis not present

## 2022-08-07 MED ORDER — TRIAMCINOLONE ACETONIDE 0.1 % EX CREA: 1.0000 | TOPICAL_CREAM | Freq: Two times a day (BID) | CUTANEOUS | 1 refills | Status: DC | PRN

## 2022-08-07 NOTE — Progress Notes (Signed)
Subjective:    Patient ID: [name], female    DOB: 1966-04-24, 56 y.o.   MRN: [id number]  HPI Virtual Visit via Video Note  I connected with the patient on 08/07/22 at  3:45 PM EDT by a video enabled telemedicine application and verified that I am speaking with the correct person using two identifiers.  Location patient: home Location provider:work or home office Persons participating in the virtual visit: patient, provider  I discussed the limitations of evaluation and management by telemedicine and the availability of in person appointments. The patient expressed understanding and agreed to proceed.   HPI: Here for 4 days of a red itchy rash that comes and goes on both forearms. No other areas of skin are involved. No SOB. She has been taking Benadryl for the past 2 days and this seems to help for a few hours.    ROS: See pertinent positives and negatives per HPI.  Past Medical History:  Diagnosis Date   Allergy    Anxiety    Depression    GERD (gastroesophageal reflux disease)    History of abnormal cervical Pap smear    1991 -- 1995--hx cryotherapy to cervix   History of colon polyps    2008- BENIGN   Hydronephrosis, left    Kidney stones 2016   PONV (postoperative nausea and vomiting)    Rosacea     Past Surgical History:  Procedure Laterality Date   ABDOMINAL HYSTERECTOMY     ANTERIOR AND POSTERIOR REPAIR WITH SACROSPINOUS FIXATION N/A 08/16/2015   Procedure: ANTERIOR COLPORRHAPHY WITH XENOFORM GRAFT AND SACROSPINOUS FIXATION;  Surgeon: Nunzio Cobbs, MD;  Location: [facility];  Service: Gynecology;  Laterality: N/A;  2.5 hours OR time   BLADDER SUSPENSION N/A 08/16/2015   Procedure: TRANSVAGINAL TAPE (TVT) PROCEDURE exact midurethral sling;  Surgeon: Nunzio Cobbs, MD;  Location: [facility];  Service: Gynecology;  Laterality: N/A;   COLONOSCOPY  08/21/2017   per Dr. Ardis Hughs, clear, repeat in 10 yrs   CYSTOSCOPY N/A 08/16/2015    Procedure: CYSTOSCOPY;  Surgeon: Nunzio Cobbs, MD;  Location: [facility];  Service: Gynecology;  Laterality: N/A;   CYSTOSCOPY W/ RETROGRADES Bilateral 06/22/2015   Procedure: CYSTOSCOPY WITH RETROGRADE PYELOGRAM;  Surgeon: Cleon Gustin, MD;  Location: [facility];  Service: Urology;  Laterality: Bilateral;   CYSTOSCOPY W/ URETERAL STENT PLACEMENT  01/1999   CYSTOSCOPY WITH HOLMIUM LASER LITHOTRIPSY Left 05/18/2015   Procedure: CYSTOSCOPY WITH HOLMIUM LASER LITHOTRIPSY;  Surgeon: Alexis Frock, MD;  Location: [facility];  Service: Urology;  Laterality: Left;   CYSTOSCOPY WITH RETROGRADE PYELOGRAM, URETEROSCOPY AND STENT PLACEMENT Left 06/22/2015   Procedure: CYSTOSCOPY,  LEFT URETEROSCOPY;  Surgeon: Cleon Gustin, MD;  Location: [facility];  Service: Urology;  Laterality: Left;   CYSTOSCOPY WITH URETEROSCOPY AND STENT PLACEMENT Left 05/18/2015   Procedure: CYSTOSCOPY WITH URETEROSCOPY, STONE MANIPULATION AND STENT PLACEMENT;  Surgeon: Alexis Frock, MD;  Location[facility];  Service: Urology;  Laterality: Left;   DIAGNOSTIC LAPAROSCOPY     DX LAPAROSCOPY  X2   ESOPHAGOGASTRODUODENOSCOPY  05/09/2007   EXPLORATORY LAPAROTOMY W/ BILATERAL SALPINGECTOMY AND REMOVAL RIGHT ECTOPIC PREG.  02/23/2004   EXTRACORPOREAL SHOCK WAVE LITHOTRIPSY  2001  &  2002   LAPAROSCOPIC CHOLECYSTECTOMY  1999   POLYPECTOMY     SHOULDER ARTHROSCOPY WITH OPEN ROTATOR CUFF REPAIR Right 2012   TOTAL ABDOMINAL HYSTERECTOMY W/ BILATERAL OOPHORECTOMY AND  LYSIS ADHESIONS  09/02/2007   TUBAL LIGATION Bilateral 3149   UMBILICAL HERNIA REPAIR  2003   VAGINAL HYSTERECTOMY N/A 08/18/2015   Procedure: Exam under Anesthesia, Excision Xenform Graft, Removal Bilateral Sacrospinous Ligament sutures;  Surgeon: Nunzio Cobbs, MD;  Location: [facility];  Service: Gynecology;  Laterality: N/A;    Family History  Problem Relation Age of Onset    Kidney disease Mother    Hypertension Mother    Colon polyps Mother    Diabetes Father    Colon polyps Father    Drug abuse Sister    Cancer Paternal Uncle        Pancreas   Pancreatic cancer Paternal Uncle    Heart attack Maternal Grandmother    Breast cancer Paternal Grandmother    Esophageal cancer Neg Hx    Stomach cancer Neg Hx    Rectal cancer Neg Hx    Colon cancer Neg Hx      Current Outpatient Medications:    triamcinolone cream (KENALOG) 0.1 %, Apply 1 Application topically 2 (two) times daily as needed (rash)., Disp: 45 g, Rfl: 1   ALPRAZolam (XANAX) 1 MG tablet, Take 1 tablet (1 mg total) by mouth 2 (two) times daily as needed for anxiety., Disp: 60 tablet, Rfl: 5   [START ON 09/30/2022] amphetamine-dextroamphetamine (ADDERALL) 10 MG tablet, Take 1 tablet (10 mg total) by mouth 2 (two) times daily., Disp: 60 tablet, Rfl: 0   doxycycline (VIBRAMYCIN) 100 MG capsule, Take 100 mg by mouth daily., Disp: , Rfl:    FLUoxetine (PROZAC) 20 MG capsule, Take 1 capsule (20 mg total) by mouth daily., Disp: 90 capsule, Rfl: 3   metroNIDAZOLE (METROCREAM) 0.75 % cream, Apply topically 2 (two) times daily., Disp: 45 g, Rfl: 3   Multiple Vitamin (MULTIVITAMIN WITH MINERALS) TABS tablet, Take 1 tablet by mouth daily., Disp: , Rfl:    pramipexole (MIRAPEX) 0.5 MG tablet, Take 1 tablet (0.5 mg total) by mouth in the morning and at bedtime., Disp: 180 tablet, Rfl: 3  EXAM:  VITALS per patient if applicable:  GENERAL: alert, oriented, appears well and in no acute distress  HEENT: atraumatic, conjunttiva clear, no obvious abnormalities on inspection of external nose and ears  NECK: normal movements of the head and neck  LUNGS: on inspection no signs of respiratory distress, breathing rate appears normal, no obvious gross SOB, gasping or wheezing  CV: no obvious cyanosis  MS: moves all visible extremities without noticeable abnormality  SKIN: multiple areas of red macular spots on  both forearms from the elbows down.   PSYCH/NEURO: pleasant and cooperative, no obvious depression or anxiety, speech and thought processing grossly intact  ASSESSMENT AND PLAN: This is likely a contact dermatitis. She can continue to take Benadryl as needed, and she can apply Triamcinolone cream BID to the areas.  Alysia Penna, MD  Discussed the following assessment and plan:  No diagnosis found.     I discussed the assessment and treatment plan with the patient. The patient was provided an opportunity to ask questions and all were answered. The patient agreed with the plan and demonstrated an understanding of the instructions.   The patient was advised to call back or seek an in-person evaluation if the symptoms worsen or if the condition fails to improve as anticipated.      Review of Systems     Objective:   Physical Exam        Assessment & Plan:

## 2022-08-15 ENCOUNTER — Ambulatory Visit
Admission: RE | Admit: 2022-08-15 | Discharge: 2022-08-15 | Disposition: A | Discharge status: Home / Self Care | Payer: No Typology Code available for payment source | Source: Ambulatory Visit | Attending: Family Medicine | Admitting: Family Medicine

## 2022-08-15 DIAGNOSIS — Z1231 Encounter for screening mammogram for malignant neoplasm of breast: Secondary | ICD-10-CM

## 2022-08-20 ENCOUNTER — Ambulatory Visit (INDEPENDENT_AMBULATORY_CARE_PROVIDER_SITE_OTHER): Payer: No Typology Code available for payment source | Admitting: Psychology

## 2022-08-20 DIAGNOSIS — F411 Generalized anxiety disorder: Secondary | ICD-10-CM | POA: Diagnosis not present

## 2022-08-20 DIAGNOSIS — F330 Major depressive disorder, recurrent, mild: Secondary | ICD-10-CM

## 2022-08-20 NOTE — Progress Notes (Signed)
[facility] Counselor/Therapist Progress Note  Patient ID: [name], MRN: [id number],    Date: 08/20/2022  Time Spent: 12:01pm-12:48pm  Treatment Type: Individual Therapy  Pt is seen for a virtual video visit via caregility.  Pt joins from her home, reporting privacy, and counselor from her home office.     Reported Symptoms: increased anxiety this past week, feelings of negative self worth  Mental Status Exam: Appearance:  Well Groomed     Behavior: Appropriate  Motor: Normal  Speech/Language:  Normal Rate  Affect: Appropriate  Mood: anxious  Thought process: normal  Thought content:   WNL  Sensory/Perceptual disturbances:   WNL  Orientation: oriented to person, place, time/date, and situation  Attention: Good  Concentration: Good  Memory: WNL  Fund of knowledge:  Good  Insight:   Good  Judgment:  Good  Impulse Control: Good   Risk Assessment: Danger to Self:  No Self-injurious Behavior: No Danger to Others: No Duty to Warn:no Physical Aggression / Violence:No  Access to Firearms a concern: No  Gang Involvement:No   Subjective: Counselor assessed pt current functioning per pt report.  Processed w/pt mood, stressors and thoughts.  Provided psychoeducation re: core beliefs and challenging and importance of repetition to assist in changing.  Encouraged daily practice of self talk.  Pt affect wnl.  Pt reported that she has experienced some increased anxiety over past week. Pt reported connected to worries w/ relationship and moments of insecurity w/ and identifying thoughts of "I'm not good enough".  Pt increased awareness of this thought of core belief and how has been reinforced in different experiences over life time.  Pt was also able to challenge and acknowledge not true.  Pt was able to identify ways to challenge and positive I am statements for self to reframe.   Pt has had recent injury that has impacted her ability to engage in physical active  which has been one of her go tos for self care.  Pt reminded of ways to engage w/ other self care until fully recovers.   Interventions: Cognitive Behavioral Therapy and Mindfulness Meditation  Diagnosis:Generalized anxiety disorder  Mild episode of recurrent major depressive disorder ([place])  Plan: Pt to f/u w/ Counseling in 2 weeks. Practice mindful/grounding breathe practice.  F/u as scheduled w/ PCP.  Individualized Treatment Plan Strengths: seeking counseling, enjoys getting nails done, enjoys breeding rottweiler dogs, enjoys the beach, family Sunday dinners  Supports: her husband and her mom    Goal/Needs for Treatment:  In order of importance to patient 1) cope with anxiety 2) increase self awareness      Client Statement of Needs: "Finding me again.  Learning how to control certain emotions.  Find out why something bothers me.  Where is this anxiety coming from and how to Deal with it"      Treatment Level: outpatient counseling  Symptoms:anxiety, worry, ruminating thoughts, emotional escalations, depressed mood, sleep disturbance  Client Treatment Preferences:biweekly counseling and continue medication management w/ PCP Increase to weekly counseling on 02/27/22.    Healthcare consumer's goal for treatment:   Counselor, [name], [facility] will support the patient's ability to achieve the goals identified. Cognitive Behavioral Therapy, Assertive Communication/Conflict Resolution Training, Relaxation Training, ACT, Humanistic and other evidenced-based practices will be used to promote progress towards healthy functioning.    Healthcare consumer will: Actively participate in therapy, working towards healthy functioning.     *Justification for Continuation/Discontinuation of Goal: R=Revised, O=Ongoing, A=Achieved, D=Discontinued   Goal 1)  Pt will increase coping with life's anxiety and stressors AEB decreased anxiety, decreased emotional escalations, decreased sleep disturbance,  increased awareness of thought patterns that impact anxiety. Baseline date 02/13/22: Progress towards goal 0; How Often - Daily Target Date Goal Was reviewed Status Code Progress towards goal  02/13/23                            Goal 2)  Increase self awareness and identifying her likes, values, interest as demonstrated by expressing and communicating in sessions.  Baseline date 02/13/22: Progress towards goal 0; How Often - Daily   Target Date Goal Was reviewed Status Code Progress towards goal/Likert rating  02/13/23                            This plan has been reviewed and created by the following participants:  This plan will be reviewed at least every 12 months. Date Behavioral Health Clinician Date Guardian/Patient   02/13/22 [name]                      02/13/22 Verbal consent provided  02/27/22 [name] [place] [name]  02/27/22  Verbal Consent provided      [name] [facility]

## 2022-08-28 ENCOUNTER — Ambulatory Visit: Payer: No Typology Code available for payment source | Admitting: Obstetrics and Gynecology

## 2022-08-31 ENCOUNTER — Encounter: Payer: Self-pay | Admitting: Obstetrics and Gynecology

## 2022-08-31 ENCOUNTER — Ambulatory Visit: Payer: No Typology Code available for payment source | Admitting: Obstetrics and Gynecology

## 2022-08-31 VITALS — BP 138/70 | HR 70 | Ht 65.5 in | Wt 174.0 lb

## 2022-08-31 DIAGNOSIS — N3946 Mixed incontinence: Secondary | ICD-10-CM

## 2022-08-31 DIAGNOSIS — N993 Prolapse of vaginal vault after hysterectomy: Secondary | ICD-10-CM

## 2022-08-31 DIAGNOSIS — N8110 Cystocele, unspecified: Secondary | ICD-10-CM

## 2022-08-31 MED ORDER — ESTRADIOL 0.1 MG/GM VA CREA: TOPICAL_CREAM | VAGINAL | 2 refills | Status: DC

## 2022-08-31 NOTE — Patient Instructions (Signed)
Sacrocolpopexy Sacrocolpopexy is a surgical procedure to return the top part of the vagina (vaginal vault) to its normal position inside the pelvis. This procedure is done when the vaginal vault drops down into the lower vagina (vaginal prolapse). It repairs the condition and relieves its symptoms, which may include bowel or bladder problems and low back pain. You may need this procedure if your pelvic muscles have become weak after childbirth, or if you have had surgery to remove your uterus (hysterectomy). During the procedure, a surgeon may use a surgical mesh to lift and support your vagina. Tell a health care provider about: Any allergies you have. All medicines you are taking, including vitamins, herbs, eye drops, creams, and over-the-counter medicines. Any problems you or family members have had with anesthetic medicines. Any blood disorders you have. Any surgeries you have had. Any medical conditions you have. Whether you are pregnant or may be pregnant. What are the risks? Generally, this is a safe procedure. However, problems may occur, including: The vaginal prolapse happening again after surgery. This is the most common problem. Bleeding. Vaginal pain. Pain or reduced sensation during sexual intercourse. Infection. Loss of urinary or bowel control. The surgical mesh moving or becoming unattached. Having a blood clot that forms in your leg and travels to your lungs (pulmonary embolism). What happens before the procedure? Staying hydrated Follow instructions from your health care provider about hydration, which may include: Up to 2 hours before the procedure - you may continue to drink clear liquids, such as water, clear fruit juice, black coffee, and plain tea.  Eating and drinking restrictions Follow instructions from your health care provider about eating and drinking, which may include: 8 hours before the procedure - stop eating heavy meals or foods, such as meat, fried  foods, or fatty foods. 6 hours before the procedure - stop eating light meals or foods, such as toast or cereal. 6 hours before the procedure - stop drinking milk or drinks that contain milk. 2 hours before the procedure - stop drinking clear liquids. Medicines Ask your health care provider about: Changing or stopping your regular medicines. This is especially important if you are taking diabetes medicines or blood thinners. Taking medicines such as aspirin and ibuprofen. These medicines can thin your blood. Do not take these medicines unless your health care provider tells you to take them. Taking over-the-counter medicines, vitamins, herbs, and supplements. General instructions Do not use any products that contain nicotine or tobacco for at least 4 weeks before the procedure. These products include cigarettes, chewing tobacco, and vaping devices, such as e-cigarettes. If you need help quitting, ask your health care provider. You may need to follow a certain diet and take medicines to clean out your digestive system (bowel prep). Follow your health care provider's instructions. Bowel prep is done to make sure your bowel is empty for the procedure. It can also prevent constipation. You may need to start your bowel prep a few days before the procedure. You may be required to use a vaginal cream to strengthen your vaginal tissues. Use it as told by your health care provider. Ask your health care provider what steps will be taken to help prevent infection. These steps may include: Removing hair at the surgery site. Washing skin with a germ-killing soap. Taking antibiotic medicine. Plan to have a responsible adult take you home from the hospital or clinic. What happens during the procedure? An IV will be inserted into one of your veins. You will be  given one or more of the following: A medicine to help you relax (sedative). A medicine to make you fall asleep (general anesthetic). A medicine that  is injected into your spine to numb the area below and slightly above the injection site (spinal anesthetic). An antibiotic medicine to prevent infection. A tube (catheter) will be inserted to drain your bladder. Your surgeon will perform the surgery using one of the following methods: Open surgery. The surgery will be done through a small incision made in the skin on your lower abdomen. Laparoscopic surgery. The surgery will be done through several tiny incisions, using long instruments and a telescopic camera. In some cases, laparoscopic surgery will be done by remote control (robotic surgery). Your surgeon will make one or more incisions and separate your vagina from your bowel and your bladder. Your surgeon will push up the vaginal prolapse and attach a surgical mesh to your vagina. The mesh will be used to lift your vaginal vault. It will be attached to a part of your tailbone with stitches (sutures) or staples. Your surgeon will close the incisions with sutures or staples. The procedure may vary among health care providers and hospitals. What happens after the procedure? Your blood pressure, heart rate, breathing rate, and blood oxygen level will be monitored until you leave the hospital or clinic. You may have a bandage in your vagina for a few days. Your catheter may be removed soon after your surgery. You may have to wear compression stockings. These stockings help to prevent blood clots and reduce swelling in your legs. You will be encouraged to walk when you can get out of bed. Walking helps prevent blood clots. You will be given fluids and nutrition through an IV until you can start eating on your own. You will be given pain medicine as needed. You may also be given antibiotics to prevent infection and a medicine to prevent blood clots. Summary Sacrocolpopexy is a surgical procedure that is done to repair vaginal prolapse, a condition in which the top part of the vagina (vaginal  vault) drops down into the lower vagina. The surgery will relieve symptoms such as bowel or bladder problems and low back pain. During the procedure, a surgeon may use a surgical mesh to lift and support your vagina. Follow instructions from your health care provider about when to stop eating and drinking before the procedure. This information is not intended to replace advice given to you by your health care provider. Make sure you discuss any questions you have with your health care provider. Document Revised: 06/14/2020 Document Reviewed: 06/14/2020 Elsevier Patient Education  [place].

## 2022-08-31 NOTE — Progress Notes (Unsigned)
GYNECOLOGY  VISIT   HPI: 56 y.o.   Married  Caucasian  female   [phone] with Patient's last menstrual period was 12/24/2006 (approximate).   here for  recheck of recurrent prolapse.   Feels like a ball is coming out of her vagina. Feeling a lot of pressure.   Difficulty making it to the bathroom on time.  Sudden urgency and leakage.  Sometimes has leakage with a cough, laugh, sneeze.  Feels she cannot hold her urine.  Her urine flow also stops and starts.   Sex is painful.  Exercising a lot.  Has had successful weight loss.   Dog is expecting puppies, and she would like to wait to do surgery until they are cared for.  Did not receive vaginal estrogen cream yet.  She updated her mammogram.  GYNECOLOGIC HISTORY: Patient's last menstrual period was 12/24/2006 (approximate). Contraception:  Hyst Menopausal hormone therapy:  None Last mammogram:  08-15-22 Neg/BiRads1 Last pap smear:   06/2013 Neg per patient        OB History     Gravida  3   Para  2   Term  2   Preterm      AB  1   Living  2      SAB      IAB      Ectopic  1   Multiple      Live Births                 Patient Active Problem List   Diagnosis Date Noted   ADHD 09/04/2021   HLD (hyperlipidemia) 04/01/2018   GERD (gastroesophageal reflux disease) 04/01/2018   Anxiety and depression 11/01/2014   RESTLESS LEG SYNDROME 09/06/2008   Migraine 02/26/2008    Past Medical History:  Diagnosis Date   Allergy    Anxiety    Depression    GERD (gastroesophageal reflux disease)    History of abnormal cervical Pap smear    1991 -- 1995--hx cryotherapy to cervix   History of colon polyps    2008- BENIGN   Hydronephrosis, left    Kidney stones 2016   PONV (postoperative nausea and vomiting)    Rosacea     Past Surgical History:  Procedure Laterality Date   ABDOMINAL HYSTERECTOMY     ANTERIOR AND POSTERIOR REPAIR WITH SACROSPINOUS FIXATION N/A 08/16/2015   Procedure: ANTERIOR  COLPORRHAPHY WITH XENOFORM GRAFT AND SACROSPINOUS FIXATION;  Surgeon: Nunzio Cobbs, MD;  Location: [facility];  Service: Gynecology;  Laterality: N/A;  2.5 hours OR time   BLADDER SUSPENSION N/A 08/16/2015   Procedure: TRANSVAGINAL TAPE (TVT) PROCEDURE exact midurethral sling;  Surgeon: Nunzio Cobbs, MD;  Location: [facility];  Service: Gynecology;  Laterality: N/A;   COLONOSCOPY  08/21/2017   per Dr. Ardis Hughs, clear, repeat in 10 yrs   CYSTOSCOPY N/A 08/16/2015   Procedure: CYSTOSCOPY;  Surgeon: Nunzio Cobbs, MD;  Location: [facility];  Service: Gynecology;  Laterality: N/A;   CYSTOSCOPY W/ RETROGRADES Bilateral 06/22/2015   Procedure: CYSTOSCOPY WITH RETROGRADE PYELOGRAM;  Surgeon: Cleon Gustin, MD;  Location: [facility];  Service: Urology;  Laterality: Bilateral;   CYSTOSCOPY W/ URETERAL STENT PLACEMENT  01/1999   CYSTOSCOPY WITH HOLMIUM LASER LITHOTRIPSY Left 05/18/2015   Procedure: CYSTOSCOPY WITH HOLMIUM LASER LITHOTRIPSY;  Surgeon: Alexis Frock, MD;  Location: [facility];  Service: Urology;  Laterality: Left;   CYSTOSCOPY WITH RETROGRADE PYELOGRAM, URETEROSCOPY AND STENT  PLACEMENT Left 06/22/2015   Procedure: CYSTOSCOPY,  LEFT URETEROSCOPY;  Surgeon: Cleon Gustin, MD;  Location: [facility];  Service: Urology;  Laterality: Left;   CYSTOSCOPY WITH URETEROSCOPY AND STENT PLACEMENT Left 05/18/2015   Procedure: CYSTOSCOPY WITH URETEROSCOPY, STONE MANIPULATION AND STENT PLACEMENT;  Surgeon: Alexis Frock, MD;  Location: [facility];  Service: Urology;  Laterality: Left;   DIAGNOSTIC LAPAROSCOPY     DX LAPAROSCOPY  X2   ESOPHAGOGASTRODUODENOSCOPY  05/09/2007   EXPLORATORY LAPAROTOMY W/ BILATERAL SALPINGECTOMY AND REMOVAL RIGHT ECTOPIC PREG.  02/23/2004   EXTRACORPOREAL SHOCK WAVE LITHOTRIPSY  2001  &  2002   LAPAROSCOPIC CHOLECYSTECTOMY  1999   POLYPECTOMY     SHOULDER ARTHROSCOPY WITH OPEN  ROTATOR CUFF REPAIR Right 2012   TOTAL ABDOMINAL HYSTERECTOMY W/ BILATERAL OOPHORECTOMY AND LYSIS ADHESIONS  09/02/2007   TUBAL LIGATION Bilateral 4854   UMBILICAL HERNIA REPAIR  2003   VAGINAL HYSTERECTOMY N/A 08/18/2015   Procedure: Exam under Anesthesia, Excision Xenform Graft, Removal Bilateral Sacrospinous Ligament sutures;  Surgeon: Nunzio Cobbs, MD;  Location: [facility];  Service: Gynecology;  Laterality: N/A;    Current Outpatient Medications  Medication Sig Dispense Refill   ALPRAZolam (XANAX) 1 MG tablet Take 1 tablet by mouth 2 (two) times daily as needed.     [START ON 09/30/2022] amphetamine-dextroamphetamine (ADDERALL) 10 MG tablet Take 1 tablet (10 mg total) by mouth 2 (two) times daily. 60 tablet 0   doxycycline (VIBRAMYCIN) 100 MG capsule Take 100 mg by mouth daily.     FLUoxetine (PROZAC) 20 MG capsule Take 1 capsule (20 mg total) by mouth daily. 90 capsule 3   metroNIDAZOLE (METROCREAM) 0.75 % cream Apply topically 2 (two) times daily. 45 g 3   Multiple Vitamin (MULTIVITAMIN WITH MINERALS) TABS tablet Take 1 tablet by mouth daily.     pramipexole (MIRAPEX) 0.5 MG tablet Take 1 tablet (0.5 mg total) by mouth in the morning and at bedtime. 180 tablet 3   No current facility-administered medications for this visit.     ALLERGIES: Desvenlafaxine, Prochlorperazine, and Iodinated contrast media  Family History  Problem Relation Age of Onset   Kidney disease Mother    Hypertension Mother    Colon polyps Mother    Diabetes Father    Colon polyps Father    Drug abuse Sister    Cancer Paternal Uncle        Pancreas   Pancreatic cancer Paternal Uncle    Heart attack Maternal Grandmother    Breast cancer Paternal Grandmother    Esophageal cancer Neg Hx    Stomach cancer Neg Hx    Rectal cancer Neg Hx    Colon cancer Neg Hx     Social History   Socioeconomic History   Marital status: Married    Spouse name: Not on file   Number of children: Not on file    Years of education: Not on file   Highest education level: Not on file  Occupational History   Not on file  Tobacco Use   Smoking status: Former    Years: 15.00    Types: Cigarettes    Quit date: 05/16/2008    Years since quitting: 14.3   Smokeless tobacco: Never  Vaping Use   Vaping Use: Never used  Substance and Sexual Activity   Alcohol use: No    Alcohol/week: 0.0 standard drinks of alcohol   Drug use: No   Sexual activity: Yes  Partners: Male    Birth control/protection: Surgical    Comment: Hyst  Other Topics Concern   Not on file  Social History Narrative   Not on file   Social Determinants of Health   Financial Resource Strain: Not on file  Food Insecurity: Not on file  Transportation Needs: Not on file  Physical Activity: Not on file  Stress: Not on file  Social Connections: Not on file  Intimate Partner Violence: Not on file    Review of Systems  Genitourinary:        Pelvic prolapse  All other systems reviewed and are negative.   PHYSICAL EXAMINATION:    BP 138/70   Pulse 70   Ht 5' 5.5" (1.664 m)   Wt 174 lb (78.9 kg)   LMP 12/24/2006 (Approximate)   BMI 28.51 kg/m     General appearance: alert, cooperative and appears stated age  Pelvic: External genitalia:  no lesions              Urethra:  normal appearing urethra with no masses, tenderness or lesions              Bartholins and Skenes: normal                 Vagina: normal appearing vagina with normal color and discharge, no lesions.  Third degree cystocele, first degree apical prolapse, minimal rectocele.               Cervix: absent                Bimanual Exam:  Uterus: absent              Adnexa: no mass, fullness, tenderness              Rectal exam: yes.  Confirms.              Anus:  normal sphincter tone, no lesions  Chaperone was present for exam:  Estill Bamberg, CMA  ASSESSMENT Status post TAH/BSO/LOA. Status post anterior colporrhaphy with midurethral sling.  Status post  excision of bilateral sacrospinous fixation sutures and Xenform graft on anterior vaginal wall. Sciatic nerve compression from sacrospinous fixation.  Resolved. Cystocele and vaginal vault prolapse.  Mixed incontinence.  She likely has some voiding dysfunction from her recurrent cystocele.  Status post umbilical herniorrhaphy.   PLAN  Rx for vaginal Estradiol cream.  Instructed in use.  We discussed her prolapse and incontinence and we used a 3D pelvic model. We reviewed laparoscopic sacrocolpopexy with possible anterior colporrhaphy for a possible surgical repair.  She would need urodynamic testing with reduction of her prolapse also. I am recommending referral to urogynecology.  She accepts referral to Dr. Sherlene Shams.   An After Visit Summary was printed and given to the patient.  35 min  total time was spent for this patient encounter, including preparation, face-to-face counseling with the patient, coordination of care, and documentation of the encounter.

## 2022-09-02 ENCOUNTER — Telehealth: Payer: Self-pay | Admitting: Obstetrics and Gynecology

## 2022-09-02 DIAGNOSIS — N3946 Mixed incontinence: Secondary | ICD-10-CM

## 2022-09-02 DIAGNOSIS — N819 Female genital prolapse, unspecified: Secondary | ICD-10-CM

## 2022-09-02 NOTE — Telephone Encounter (Signed)
Please make a referral to Dr. Sherlene Shams for recurrent pelvic organ prolapse and mixed incontinence.  Patient is interested in surgical care.

## 2022-09-03 ENCOUNTER — Ambulatory Visit (INDEPENDENT_AMBULATORY_CARE_PROVIDER_SITE_OTHER): Payer: No Typology Code available for payment source | Admitting: Psychology

## 2022-09-03 DIAGNOSIS — F411 Generalized anxiety disorder: Secondary | ICD-10-CM

## 2022-09-03 DIAGNOSIS — F330 Major depressive disorder, recurrent, mild: Secondary | ICD-10-CM | POA: Diagnosis not present

## 2022-09-03 NOTE — Telephone Encounter (Signed)
Amb referral order placed in Epic.

## 2022-09-03 NOTE — Progress Notes (Signed)
[place] Counselor/Therapist Progress Note  Patient ID: [name], MRN: [id number],    Date: 09/03/2022  Time Spent: 12:02pm-12:51pm  Treatment Type: Individual Therapy  Pt is seen for a virtual video visit via caregility.  Pt joins from her car, reporting privacy, and counselor from her home office.     Reported Symptoms: emotional lability over weekend following conflict w/ husband, worry re: relationship  Mental Status Exam: Appearance:  Well Groomed     Behavior: Appropriate  Motor: Normal  Speech/Language:  Normal Rate  Affect: Appropriate  Mood: anxious  Thought process: normal  Thought content:   WNL  Sensory/Perceptual disturbances:   WNL  Orientation: oriented to person, place, time/date, and situation  Attention: Good  Concentration: Good  Memory: WNL  Fund of knowledge:  Good  Insight:   Good  Judgment:  Good  Impulse Control: Good   Risk Assessment: Danger to Self:  No Self-injurious Behavior: No Danger to Others: No Duty to Warn:no Physical Aggression / Violence:No  Access to Firearms a concern: No  Gang Involvement:No   Subjective: Counselor assessed pt current functioning per pt report.  Processed w/pt recent relationship conflict and escalating emotions.  Explored pt self care, conflict resolution and emotional supports.  Encouraged pt to take space to allow for emotions on both sides to deescalate to allow for conflict resolution. Explored pt supports and self care to manage through hurt feelings and "break" from husband.  Discussed emotional deescalation skills and utilizing.   Discussed potential for couples counseling referral if needed.  Pt affect congruent w/ anxiety and hurt feelings.  Pt tearful at times.  Pt reported no SI or self harm.  Pt reported that over weekend some feelings of hurt and discomfort w/ female who reaching out to husband via social media  private messages- talked and resolved.  Pt anger when this  individual reached out again and husband didn't want to further talk about. Pt reported felt shut down and anger turned to throwing things and hurtful statements and husband responded w/ hurtful statements and that he was done in relationship. Pt reports going to her son's to give space and that husband today said needs couple of days of space.  Pt acknowledges that both emotional escalated and quick to return to passive aggressive or aggressive communication.  Pt discussed her supports and self care and to take a day at a time w/ focus on deescalation skills for self.     Interventions: Cognitive Behavioral Therapy, Mindfulness Meditation, and supportive  Diagnosis:Generalized anxiety disorder  Mild episode of recurrent major depressive disorder ([name])  Plan: Pt to f/u w/ Counseling in 1 week. Practice mindful/grounding breathe practice and daily movement.  Reviewed crisis services if needed.  F/u as scheduled w/ PCP.  Individualized Treatment Plan Strengths: seeking counseling, enjoys getting nails done, enjoys breeding rottweiler dogs, enjoys the beach, family Sunday dinners  Supports: her husband and her mom    Goal/Needs for Treatment:  In order of importance to patient 1) cope with anxiety 2) increase self awareness      Client Statement of Needs: "Finding me again.  Learning how to control certain emotions.  Find out why something bothers me.  Where is this anxiety coming from and how to Deal with it"      Treatment Level: outpatient counseling  Symptoms:anxiety, worry, ruminating thoughts, emotional escalations, depressed mood, sleep disturbance  Client Treatment Preferences:biweekly counseling and continue medication management w/ PCP Increase to weekly counseling on 02/27/22.  Healthcare consumer's goal for treatment:   Counselor, [name], [facility] will support the patient's ability to achieve the goals identified. Cognitive Behavioral Therapy, Assertive Communication/Conflict  Resolution Training, Relaxation Training, ACT, Humanistic and other evidenced-based practices will be used to promote progress towards healthy functioning.    Healthcare consumer will: Actively participate in therapy, working towards healthy functioning.     *Justification for Continuation/Discontinuation of Goal: R=Revised, O=Ongoing, A=Achieved, D=Discontinued   Goal 1) Pt will increase coping with life's anxiety and stressors AEB decreased anxiety, decreased emotional escalations, decreased sleep disturbance, increased awareness of thought patterns that impact anxiety. Baseline date 02/13/22: Progress towards goal 0; How Often - Daily Target Date Goal Was reviewed Status Code Progress towards goal  02/13/23                            Goal 2)  Increase self awareness and identifying her likes, values, interest as demonstrated by expressing and communicating in sessions.  Baseline date 02/13/22: Progress towards goal 0; How Often - Daily   Target Date Goal Was reviewed Status Code Progress towards goal/Likert rating  02/13/23                            This plan has been reviewed and created by the following participants:  This plan will be reviewed at least every 12 months. Date Behavioral Health Clinician Date Guardian/Patient   02/13/22 [name] [name]                      02/13/22 Verbal consent provided  02/27/22 [name] [name]  02/27/22  Verbal Consent provided      [name]

## 2022-09-10 ENCOUNTER — Ambulatory Visit (INDEPENDENT_AMBULATORY_CARE_PROVIDER_SITE_OTHER): Payer: No Typology Code available for payment source | Admitting: Psychology

## 2022-09-10 DIAGNOSIS — F330 Major depressive disorder, recurrent, mild: Secondary | ICD-10-CM

## 2022-09-10 DIAGNOSIS — F411 Generalized anxiety disorder: Secondary | ICD-10-CM

## 2022-09-10 NOTE — Progress Notes (Signed)
[place] Counselor/Therapist Progress Note  Patient ID: [name], MRN: [id number],    Date: 09/10/2022  Time Spent: 12:02pm-12:49pm  Treatment Type: Individual Therapy  Pt is seen for a virtual video visit via caregility.  Pt joins from her home, reporting privacy, and counselor from her home office.     Reported Symptoms: no incidents of emotional outbursts, returned home, easily irritable, worry re: relationship  Mental Status Exam: Appearance:  Well Groomed     Behavior: Appropriate  Motor: Normal  Speech/Language:  Normal Rate  Affect: Appropriate  Mood: anxious  Thought process: normal  Thought content:   WNL  Sensory/Perceptual disturbances:   WNL  Orientation: oriented to person, place, time/date, and situation  Attention: Good  Concentration: Good  Memory: WNL  Fund of knowledge:  Good  Insight:   Good  Judgment:  Good  Impulse Control: Good   Risk Assessment: Danger to Self:  No Self-injurious Behavior: No Danger to Others: No Duty to Warn:no Physical Aggression / Violence:No  Access to Firearms a concern: No  Gang Involvement:No   Subjective: Counselor assessed pt current functioning per pt report.  Processed w/pt recent interactions w/ her husband and related emotions.  Explored pt self care, conflict resolution and emotional supports.  Discussed ways of reframing need for immediate resolution and ways of expressing self and focusing on emotional deescalation.  Discussed ways of having healthy space in relationship.  Pt affect wnl.  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  Pt reports have been able to talk w/ husband and feel heard and other times conflict increasing, especially when feeling "shut down" by husband from expressing herself.  Pt reports no emotional outbursts, but both have communicating in some passive aggressive ways.  Pt recognized ways of venting and  communicating emotions. Pt dicussed her self care and relaxation plans for grounding emotions.   Interventions: Cognitive Behavioral Therapy, Mindfulness Meditation, and supportive  Diagnosis:Generalized anxiety disorder  Mild episode of recurrent major depressive disorder ([place])  Plan: Pt to f/u w/ Counseling in 1 week. Practice mindful/grounding breathe practice and daily movement.  Reviewed crisis services if needed.  F/u as scheduled w/ PCP.  Individualized Treatment Plan Strengths: seeking counseling, enjoys getting nails done, enjoys breeding rottweiler dogs, enjoys the beach, family Sunday dinners  Supports: her husband and her mom    Goal/Needs for Treatment:  In order of importance to patient 1) cope with anxiety 2) increase self awareness      Client Statement of Needs: "Finding me again.  Learning how to control certain emotions.  Find out why something bothers me.  Where is this anxiety coming from and how to Deal with it"      Treatment Level: outpatient counseling  Symptoms:anxiety, worry, ruminating thoughts, emotional escalations, depressed mood, sleep disturbance  Client Treatment Preferences:biweekly counseling and continue medication management w/ PCP Increase to weekly counseling on 02/27/22.    Healthcare consumer's goal for treatment:   Counselor, [name], [facility] will support the patient's ability to achieve the goals identified. Cognitive Behavioral Therapy, Assertive Communication/Conflict Resolution Training, Relaxation Training, ACT, Humanistic and other evidenced-based practices will be used to promote progress towards healthy functioning.    Healthcare consumer will: Actively participate in therapy, working towards healthy functioning.     *Justification for Continuation/Discontinuation of Goal: R=Revised, O=Ongoing, A=Achieved, D=Discontinued   Goal 1) Pt will increase coping with life's anxiety and stressors AEB decreased anxiety, decreased emotional  escalations, decreased sleep disturbance, increased awareness of thought patterns that impact anxiety. Baseline date 02/13/22: Progress towards goal 0; How Often - Daily Target Date Goal Was reviewed Status Code Progress towards goal  02/13/23                            Goal 2)  Increase self awareness and identifying her likes, values, interest as demonstrated by expressing and communicating in sessions.  Baseline date 02/13/22: Progress towards goal 0; How Often - Daily   Target Date Goal Was reviewed Status Code Progress towards goal/Likert rating  02/13/23                            This plan has been reviewed and created by the following participants:  This plan will be reviewed at least every 12 months. Date Behavioral Health Clinician Date Guardian/Patient   02/13/22 [name]                      02/13/22 Verbal consent provided  02/27/22 [name] [name]  02/27/22  Verbal Consent provided      [name] [facility]

## 2022-09-17 ENCOUNTER — Ambulatory Visit (INDEPENDENT_AMBULATORY_CARE_PROVIDER_SITE_OTHER): Payer: No Typology Code available for payment source | Admitting: Psychology

## 2022-09-17 DIAGNOSIS — F330 Major depressive disorder, recurrent, mild: Secondary | ICD-10-CM | POA: Diagnosis not present

## 2022-09-17 DIAGNOSIS — F411 Generalized anxiety disorder: Secondary | ICD-10-CM | POA: Diagnosis not present

## 2022-09-17 NOTE — Progress Notes (Signed)
[place] Counselor/Therapist Progress Note  Patient ID: [name], MRN: [id number],    Date: 09/17/2022  Time Spent: 12:00pm-12:49pm  Treatment Type: Individual Therapy  Pt is seen for a virtual video visit via caregility.  Pt joins from her home, reporting privacy, and counselor from her home office.     Reported Symptoms: no incidents of emotional outbursts, less anxious, practicing daily mindfulness/relaxation  Mental Status Exam: Appearance:  Well Groomed     Behavior: Appropriate  Motor: Normal  Speech/Language:  Normal Rate  Affect: Appropriate  Mood: normal  Thought process: normal  Thought content:   WNL  Sensory/Perceptual disturbances:   WNL  Orientation: oriented to person, place, time/date, and situation  Attention: Good  Concentration: Good  Memory: WNL  Fund of knowledge:  Good  Insight:   Good  Judgment:  Good  Impulse Control: Good   Risk Assessment: Danger to Self:  No Self-injurious Behavior: No Danger to Others: No Duty to Warn:no Physical Aggression / Violence:No  Access to Firearms a concern: No  Gang Involvement:No   Subjective: Counselor assessed pt current functioning per pt report.  Processed w/pt mood and use of coping skills.  Encouraged continued use of relaxation skills daily to assist in managing mood.  Explored stress management and saying no when needs to.  Discussed anticipating stress in relationship and focusing on emotional deescalation skills.  Pt affect wnl.  Pt reported she has been less anxious.  Pt reports she is exercising/moving daily and also practicing some guided relaxation.  Pt reports more calm in interaction w/ husband.  Pt reports communicating w/ husband and recognizing need to managing her stress. Pt recognized times needs to say no as taking on too much.  Pt discussed reminders giving self for stepping away if needed in argument/disagreement to allow for deescalation as part of conflict  resolution.  Interventions: Cognitive Behavioral Therapy, Mindfulness Meditation, and supportive  Diagnosis:Generalized anxiety disorder  Mild episode of recurrent major depressive disorder ([place])  Plan: Pt to f/u w/ Counseling in 1 week. Practice mindful/grounding breathe practice and daily movement.  Reviewed crisis services if needed.  F/u as scheduled w/ PCP.  Individualized Treatment Plan Strengths: seeking counseling, enjoys getting nails done, enjoys breeding rottweiler dogs, enjoys the beach, family Sunday dinners  Supports: her husband and her mom    Goal/Needs for Treatment:  In order of importance to patient 1) cope with anxiety 2) increase self awareness      Client Statement of Needs: "Finding me again.  Learning how to control certain emotions.  Find out why something bothers me.  Where is this anxiety coming from and how to Deal with it"      Treatment Level: outpatient counseling  Symptoms:anxiety, worry, ruminating thoughts, emotional escalations, depressed mood, sleep disturbance  Client Treatment Preferences:biweekly counseling and continue medication management w/ PCP Increase to weekly counseling on 02/27/22.    Healthcare consumer's goal for treatment:   Counselor, [name], [facility] will support the patient's ability to achieve the goals identified. Cognitive Behavioral Therapy, Assertive Communication/Conflict Resolution Training, Relaxation Training, ACT, Humanistic and other evidenced-based practices will be used to promote progress towards healthy functioning.    Healthcare consumer will: Actively participate in therapy, working towards healthy functioning.     *Justification for Continuation/Discontinuation of Goal: R=Revised, O=Ongoing, A=Achieved, D=Discontinued   Goal 1) Pt will increase coping with life's anxiety and stressors AEB decreased anxiety, decreased emotional escalations, decreased sleep disturbance, increased awareness of thought patterns  that  impact anxiety. Baseline date 02/13/22: Progress towards goal 0; How Often - Daily Target Date Goal Was reviewed Status Code Progress towards goal  02/13/23                            Goal 2)  Increase self awareness and identifying her likes, values, interest as demonstrated by expressing and communicating in sessions.  Baseline date 02/13/22: Progress towards goal 0; How Often - Daily   Target Date Goal Was reviewed Status Code Progress towards goal/Likert rating  02/13/23                            This plan has been reviewed and created by the following participants:  This plan will be reviewed at least every 12 months. Date Behavioral Health Clinician Date Guardian/Patient   02/13/22 [name]                      02/13/22 Verbal consent provided  02/27/22 [name] [place] [name]  02/27/22  Verbal Consent provided     [name] [facility]

## 2022-09-17 NOTE — Telephone Encounter (Signed)
Encounter reviewed and closed.  

## 2022-09-17 NOTE — Telephone Encounter (Signed)
Patient scheduled on 09/18/22

## 2022-09-18 ENCOUNTER — Encounter: Payer: Self-pay | Admitting: Obstetrics and Gynecology

## 2022-09-18 ENCOUNTER — Ambulatory Visit: Payer: No Typology Code available for payment source | Admitting: Obstetrics and Gynecology

## 2022-09-18 VITALS — BP 121/71 | HR 69 | Ht 65.0 in | Wt 169.0 lb

## 2022-09-18 DIAGNOSIS — N393 Stress incontinence (female) (male): Secondary | ICD-10-CM

## 2022-09-18 DIAGNOSIS — M62838 Other muscle spasm: Secondary | ICD-10-CM

## 2022-09-18 DIAGNOSIS — N8110 Cystocele, unspecified: Secondary | ICD-10-CM

## 2022-09-18 DIAGNOSIS — N3281 Overactive bladder: Secondary | ICD-10-CM | POA: Diagnosis not present

## 2022-09-18 DIAGNOSIS — R350 Frequency of micturition: Secondary | ICD-10-CM

## 2022-09-18 DIAGNOSIS — N993 Prolapse of vaginal vault after hysterectomy: Secondary | ICD-10-CM | POA: Diagnosis not present

## 2022-09-18 LAB — POCT URINALYSIS DIPSTICK
Bilirubin, UA: NEGATIVE
Blood, UA: NEGATIVE
Glucose, UA: NEGATIVE
Ketones, UA: NEGATIVE
Nitrite, UA: NEGATIVE
Protein, UA: NEGATIVE
Spec Grav, UA: >=1.03 — AB (ref 1.010–1.025)
Urobilinogen, UA: 0.2 E.U./dL
pH, UA: 5 (ref 5.0–8.0)

## 2022-09-18 MED ORDER — SOLIFENACIN SUCCINATE 5 MG PO TABS: 5.0000 mg | ORAL_TABLET | Freq: Every day | ORAL | 5 refills | Status: DC

## 2022-09-18 MED ORDER — DIAZEPAM 5 MG PO TABS: ORAL_TABLET | ORAL | 0 refills | Status: DC

## 2022-09-18 NOTE — Progress Notes (Signed)
[facility] New Patient Evaluation and Consultation  Referring Provider: Aundria Rud* PCP: Laurey Morale, MD Date of Service: 09/18/2022  SUBJECTIVE Chief Complaint: New Patient (Initial Visit)  History of Present Illness: Zikeria Keough is a 56 y.o. White or Caucasian female seen in consultation at the request of Dr. Aundria Rud for evaluation of prolapse.    Review of records from Dr Quincy Simmonds significant for: Has pressure in her vagina and feels a ball coming out. Has had TAH, Anterior repair, midurethral sling. In 2016, had SSLF with xenform graft, with subsequent removal of sutures due to sciatic entrapment. 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   Urinary Symptoms: Leaks urine with with a full bladder, with movement to the bathroom, and with urgency. Has occasional leakage with cough/ sneeze.  Leaks 2-3 time(s) per day Pad use: none She is bothered by her UI symptoms. She is not sure if she has tried a medication before.  Has a midurethral sling.   Day time voids 8-10.  Nocturia: 1-2 times per night to void. Voiding dysfunction: she does not empty her bladder well.  does not use a catheter to empty bladder.  When urinating, she feels a weak stream and the need to urinate multiple times in a row. Has to shift around to empty.  Drinks: 2 cups coffee, 70-80oz water per day, has cut back on coffee/ caffeine. Drinks with pills before bed, but not much.   UTIs: 1 UTI's in the last year.   Reports history of kidney or bladder stones. Has history of pyelonephritis.  Pelvic Organ Prolapse Symptoms:                  She Admits to a feeling of a bulge the vaginal area. It has been present for 3 months.  She Admits to seeing a bulge.  This bulge is bothersome. Has a history of SSLF with A/P repair in 2016 and had to have SS sutures removed the next day.   Bowel Symptom: Bowel movements: 1 time(s) per day Stool consistency: soft   Straining: no.  Splinting: no.  Incomplete evacuation: no.  She Denies accidental bowel leakage / fecal incontinence Bowel regimen: none   Sexual Function Sexually active: yes.  Sexual orientation: Straight Pain with sex: Yes, deep in the pelvis, has discomfort due to prolapse  Pelvic Pain Denies pelvic pain, but just has pressure that comes and goes.    Past Medical History:  Past Medical History:  Diagnosis Date   Allergy    Anxiety    Depression    GERD (gastroesophageal reflux disease)    History of abnormal cervical Pap smear    1991 -- 1995--hx cryotherapy to cervix   History of colon polyps    2008- BENIGN   Hydronephrosis, left    Kidney stones 2016   PONV (postoperative nausea and vomiting)    Rosacea      Past Surgical History:   Past Surgical History:  Procedure Laterality Date   ABDOMINAL HYSTERECTOMY     ANTERIOR AND POSTERIOR REPAIR WITH SACROSPINOUS FIXATION N/A 08/16/2015   Procedure: ANTERIOR COLPORRHAPHY WITH XENOFORM GRAFT AND SACROSPINOUS FIXATION;  Surgeon: Nunzio Cobbs, MD;  Location: [facility];  Service: Gynecology;  Laterality: N/A;  2.5 hours OR time   BLADDER SUSPENSION N/A 08/16/2015   Procedure: TRANSVAGINAL TAPE (TVT) PROCEDURE exact midurethral sling;  Surgeon: Nunzio Cobbs, MD;  Location: [facility];  Service: Gynecology;  Laterality: N/A;   COLONOSCOPY  08/21/2017   per Dr. Ardis Hughs, clear, repeat in 10 yrs   CYSTOSCOPY N/A 08/16/2015   Procedure: CYSTOSCOPY;  Surgeon: Nunzio Cobbs, MD;  Location: [facility];  Service: Gynecology;  Laterality: N/A;   CYSTOSCOPY W/ RETROGRADES Bilateral 06/22/2015   Procedure: CYSTOSCOPY WITH RETROGRADE PYELOGRAM;  Surgeon: Cleon Gustin, MD;  Location[facility];  Service: Urology;  Laterality: Bilateral;   CYSTOSCOPY W/ URETERAL STENT PLACEMENT  01/1999   CYSTOSCOPY WITH HOLMIUM LASER LITHOTRIPSY Left 05/18/2015   Procedure: CYSTOSCOPY WITH HOLMIUM LASER  LITHOTRIPSY;  Surgeon: Alexis Frock, MD;  Location: [facility];  Service: Urology;  Laterality: Left;   CYSTOSCOPY WITH RETROGRADE PYELOGRAM, URETEROSCOPY AND STENT PLACEMENT Left 06/22/2015   Procedure: CYSTOSCOPY,  LEFT URETEROSCOPY;  Surgeon: Cleon Gustin, MD;  Location: [facility];  Service: Urology;  Laterality: Left;   CYSTOSCOPY WITH URETEROSCOPY AND STENT PLACEMENT Left 05/18/2015   Procedure: CYSTOSCOPY WITH URETEROSCOPY, STONE MANIPULATION AND STENT PLACEMENT;  Surgeon: Alexis Frock, MD;  Location: [facility];  Service: Urology;  Laterality: Left;   DIAGNOSTIC LAPAROSCOPY     DX LAPAROSCOPY  X2   ESOPHAGOGASTRODUODENOSCOPY  05/09/2007   EXPLORATORY LAPAROTOMY W/ BILATERAL SALPINGECTOMY AND REMOVAL RIGHT ECTOPIC PREG.  02/23/2004   EXTRACORPOREAL SHOCK WAVE LITHOTRIPSY  2001  &  2002   LAPAROSCOPIC CHOLECYSTECTOMY  1999   POLYPECTOMY     SHOULDER ARTHROSCOPY WITH OPEN ROTATOR CUFF REPAIR Right 2012   TOTAL ABDOMINAL HYSTERECTOMY W/ BILATERAL OOPHORECTOMY AND LYSIS ADHESIONS  09/02/2007   TUBAL LIGATION Bilateral 1610   UMBILICAL HERNIA REPAIR  2003   unsure if she has mesh   VAGINAL HYSTERECTOMY N/A 08/18/2015   Procedure: Exam under Anesthesia, Excision Xenform Graft, Removal Bilateral Sacrospinous Ligament sutures;  Surgeon: Nunzio Cobbs, MD;  Location: [facility];  Service: Gynecology;  Laterality: N/A;     Past OB/GYN History: OB History  Gravida Para Term Preterm AB Living  '3 2 2   1 2  '$ SAB IAB Ectopic Multiple Live Births      1   2    # Outcome Date GA Lbr Len/2nd Weight Sex Delivery Anes PTL Lv  3 Term           2 Term           1 Ectopic             Vaginal deliveries: 2- first delivery was forceps, Cesarean section: 0 S/p hysterectomy   Medications: She has a current medication list which includes the following prescription(s): diazepam, solifenacin, alprazolam, [START ON 09/30/2022]  amphetamine-dextroamphetamine, doxycycline, estradiol, fluoxetine, metronidazole, multivitamin with minerals, and pramipexole.   Allergies: Patient is allergic to desvenlafaxine, prochlorperazine, and iodinated contrast media.   Social History:  Social History   Tobacco Use   Smoking status: Former    Years: 15.00    Types: Cigarettes    Quit date: 05/16/2008    Years since quitting: 14.3   Smokeless tobacco: Never  Vaping Use   Vaping Use: Never used  Substance Use Topics   Alcohol use: No    Alcohol/week: 0.0 standard drinks of alcohol   Drug use: No    Relationship status: married She lives with her husband.   She is employed as an Passenger transport manager. Regular exercise: Yes: cycling, treadmill, light weights History of abuse: No  Family History:   Family History  Problem Relation Age of  Onset   Kidney disease Mother    Hypertension Mother    Colon polyps Mother    Diabetes Father    Colon polyps Father    Drug abuse Sister    Cancer Paternal Uncle        Pancreas   Pancreatic cancer Paternal Uncle    Heart attack Maternal Grandmother    Breast cancer Paternal Grandmother    Esophageal cancer Neg Hx    Stomach cancer Neg Hx    Rectal cancer Neg Hx    Colon cancer Neg Hx      Review of Systems: Review of Systems  Constitutional:  Negative for fever, malaise/fatigue and weight loss.  Respiratory:  Negative for cough, shortness of breath and wheezing.   Cardiovascular:  Negative for chest pain, palpitations and leg swelling.  Gastrointestinal:  Negative for abdominal pain and blood in stool.  Genitourinary:  Negative for dysuria.  Musculoskeletal:  Negative for myalgias.  Skin:  Negative for rash.  Neurological:  Negative for dizziness and headaches.  Endo/Heme/Allergies:  Bruises/bleeds easily.  Psychiatric/Behavioral:  Negative for depression. The patient is nervous/anxious.      OBJECTIVE Physical Exam: Vitals:   09/18/22 1537  BP: 121/71  Pulse: 69   Weight: 169 lb (76.7 kg)  Height: '5\' 5"'$  (1.651 m)    Physical Exam Constitutional:      General: She is not in acute distress. Pulmonary:     Effort: Pulmonary effort is normal.  Abdominal:     General: There is no distension.     Palpations: Abdomen is soft.     Tenderness: There is no abdominal tenderness. There is no rebound.  Musculoskeletal:        General: No swelling. Normal range of motion.  Skin:    General: Skin is warm and dry.     Findings: No rash.  Neurological:     Mental Status: She is alert and oriented to person, place, and time.  Psychiatric:        Mood and Affect: Mood normal.        Behavior: Behavior normal.      GU / Detailed Urogynecologic Evaluation:  Pelvic Exam: Normal external female genitalia; Bartholin's and Skene's glands normal in appearance; urethral meatus normal in appearance, no urethral masses or discharge.   CST: negative  s/p hysterectomy: Speculum exam reveals normal vaginal mucosa with  atrophy and normal vaginal cuff.  Adnexa no mass, fullness, tenderness.    Pelvic floor strength I/V  Pelvic floor musculature: Right levator tender, Right obturator tender, Left levator tender, Left obturator tender  POP-Q:   POP-Q  1.5                                            Aa   1.5                                           Ba  -4                                              C   5  Gh  4.5                                            Pb  6.5                                            tvl   -2                                            Ap  -2                                            Bp                                                 D     Rectal Exam:  Normal external rectum  Post-Void Residual (PVR) by Bladder Scan: In order to evaluate bladder emptying, we discussed obtaining a postvoid residual and she agreed to this procedure.  Procedure: The ultrasound unit was placed  on the patient's abdomen in the suprapubic region after the patient had voided. A PVR of 43 ml was obtained by bladder scan.  Laboratory Results: POC urine: trace blood   ASSESSMENT AND PLAN Ms. Czerwinski is a 56 y.o. with:  1. Prolapse of anterior vaginal wall   2. Vaginal vault prolapse after hysterectomy   3. Overactive bladder   4. Levator spasm   5. Urinary frequency   6. SUI (stress urinary incontinence, female)    Stage II anterior, Stage I posterior, Stage I apical prolapse - For treatment of pelvic organ prolapse, we discussed options for management including expectant management, conservative management, and surgical management, such as Kegels, a pessary, pelvic floor physical therapy, and specific surgical procedures. - She is interested in surgery. Recommend sacrocolpopexy with mesh since she has failed prior native tissue repair, but also discussed the option of uterosacral suspension with anterior repair. She prefers mesh procedure.   2. OAB - We discussed the symptoms of overactive bladder (OAB), which include urinary urgency, urinary frequency, nocturia, with or without urge incontinence.  While we do not know the exact etiology of OAB, several treatment options exist. We discussed management including behavioral therapy (decreasing bladder irritants, urge suppression strategies, timed voids, bladder retraining), physical therapy, medication; for refractory cases posterior tibial nerve stimulation, sacral neuromodulation, and intravesical botulinum toxin injection.  - Prescribed vesicare '5mg'$  daily. For anticholinergic medications, we discussed the potential side effects of anticholinergics including dry eyes, dry mouth, constipation, cognitive impairment and urinary retention.  3. Levator spasm - may be contributing to pain with intercourse - The origin of pelvic floor muscle spasm can be multifactorial, including primary, reactive to a different pain source, trauma, or even part  of a centralized pain syndrome.Treatment options include pelvic floor physical therapy, local (vaginal) or oral  muscle relaxants, pelvic muscle trigger point injections or centrally acting  pain medications.   - Prescribed vaginal valium '5mg'$  daily as needed. Advised to not take valium with xanax (does no take every day).  4. SUI - has rare SUI - Will have her undergo urodynamic testing since she has a history of a sling  Return for urodynamics  Jaquita Folds, MD

## 2022-09-18 NOTE — Patient Instructions (Signed)
URODYNAMICS (UDS) TEST INFORMATION  IMPORTANT: Please try to arrive with a comfortably full bladder!    What is UDS? Urodynamics is a bladder test used to evaluate how your bladder and urethra (tube you urinate out of) work to help find out the cause of your bladder symptoms and evaluate your bladder function in order to make the best treatment plan for you.   What to expect? A nurse will perform the test and will be with you during the entire exam. First we will have to empty your bladder on a special toilet.  After you have emptied your bladder, very small catheters (plastic tubing) will be placed into your bladder and into your vagina (or rectum). These special small catheters measure pressure to help measure your bladder function.  Your bladder will be gently filled with water and you will be asked to cough and strain at several different points during the test.   You will then be asked to empty your bladder in the special toilet with the catheters in place. Most patients can urinate (pee) easily with the catheters in place since the catheters are so small. In total this procedure lasts about 45 minutes to 1 hour.  After your test is completed, you will return (or possibly be seen the same day) to review the results, talk about treatment options and make a plan moving forward.   You have a stage 2 (out of 4) prolapse.  We discussed the fact that it is not life threatening but there are several treatment options. For treatment of pelvic organ prolapse, we discussed options for management including expectant management, conservative management, and surgical management, such as Kegels, a pessary, pelvic floor physical therapy, and specific surgical procedures.    I will prescribe valium 5 mg pills to place vaginally up to 2 times a day for vaginal muscle spasms. Start at night and take one every night for the next several weeks to see if it improves your symptoms. Once you are improving you can  taper off the medication and just use as needed. If the medication makes you drowsy then only use at bedtime and/or we can reduce the dose. Do not use gel to place it, as that will prevent the tablet from dissolving.  Instead place 1-2 drops of water on the table before inserting it in the vagina. If the pills do not dissolve well we can switch to a special compounded suppository. Let me know how you are doing on the medication and if you have any questions.

## 2022-09-24 ENCOUNTER — Ambulatory Visit (INDEPENDENT_AMBULATORY_CARE_PROVIDER_SITE_OTHER): Payer: No Typology Code available for payment source | Admitting: Psychology

## 2022-09-24 DIAGNOSIS — F411 Generalized anxiety disorder: Secondary | ICD-10-CM | POA: Diagnosis not present

## 2022-09-24 DIAGNOSIS — F330 Major depressive disorder, recurrent, mild: Secondary | ICD-10-CM | POA: Diagnosis not present

## 2022-09-24 NOTE — Progress Notes (Signed)
[facility] Counselor/Therapist Progress Note  Patient ID: [name], MRN: [id number],    Date: 09/24/2022  Time Spent: 12:01pm-12:41pm  Treatment Type: Individual Therapy  Pt is seen for a virtual video visit via caregility.  Pt joins from her home, reporting privacy, and counselor from her home office.     Reported Symptoms: no incidents of emotional outbursts, some irritable/anxious, practicing daily mindfulness/relaxation  Mental Status Exam: Appearance:  Well Groomed     Behavior: Appropriate  Motor: Normal  Speech/Language:  Normal Rate  Affect: Appropriate  Mood: anxious  Thought process: normal  Thought content:   WNL  Sensory/Perceptual disturbances:   WNL  Orientation: oriented to person, place, time/date, and situation  Attention: Good  Concentration: Good  Memory: WNL  Fund of knowledge:  Good  Insight:   Good  Judgment:  Good  Impulse Control: Good   Risk Assessment: Danger to Self:  No Self-injurious Behavior: No Danger to Others: No Duty to Warn:no Physical Aggression / Violence:No  Access to Firearms a concern: No  Gang Involvement:No   Subjective: Counselor assessed pt current functioning per pt report.  Processed w/pt anxiety, irritability and conflict.   Reflected pt awareness of escalating emotions in conflict and ways utilized coping skills to manage.  Encouraged continued use of relaxation skills daily to assist in managing mood, using self compassion statements and practicing no/boundaries.  Pt affect wnl.  Pt reported her anxiety overall less, did have incident of conflict w/ anxiety and irritability.  Pt reported that didn't turn into outburst, recognized escalating emotions and negative self talk.  Pt reports was able to utilize coping skills to deescalate.  Pt agrees to continued use of relaxation and positive self talk statements.    Interventions: Cognitive Behavioral Therapy, Mindfulness Meditation, and  supportive  Diagnosis:Generalized anxiety disorder  Mild episode of recurrent major depressive disorder ([name])  Plan: Pt to f/u w/ Counseling in 2 weeks. Practice mindful/grounding breathe practice and daily movement.  Reviewed crisis services if needed.  F/u as scheduled w/ PCP.  Individualized Treatment Plan Strengths: seeking counseling, enjoys getting nails done, enjoys breeding rottweiler dogs, enjoys the beach, family Sunday dinners  Supports: her husband and her mom    Goal/Needs for Treatment:  In order of importance to patient 1) cope with anxiety 2) increase self awareness      Client Statement of Needs: "Finding me again.  Learning how to control certain emotions.  Find out why something bothers me.  Where is this anxiety coming from and how to Deal with it"      Treatment Level: outpatient counseling  Symptoms:anxiety, worry, ruminating thoughts, emotional escalations, depressed mood, sleep disturbance  Client Treatment Preferences:biweekly counseling and continue medication management w/ PCP Increase to weekly counseling on 02/27/22.    Healthcare consumer's goal for treatment:   Counselor, [name], [facility] will support the patient's ability to achieve the goals identified. Cognitive Behavioral Therapy, Assertive Communication/Conflict Resolution Training, Relaxation Training, ACT, Humanistic and other evidenced-based practices will be used to promote progress towards healthy functioning.    Healthcare consumer will: Actively participate in therapy, working towards healthy functioning.     *Justification for Continuation/Discontinuation of Goal: R=Revised, O=Ongoing, A=Achieved, D=Discontinued   Goal 1) Pt will increase coping with life's anxiety and stressors AEB decreased anxiety, decreased emotional escalations, decreased sleep disturbance, increased awareness of thought patterns that impact anxiety. Baseline date 02/13/22: Progress towards goal 0; How Often -  Daily Target Date Goal Was reviewed Status Code  Progress towards goal  02/13/23                            Goal 2)  Increase self awareness and identifying her likes, values, interest as demonstrated by expressing and communicating in sessions.  Baseline date 02/13/22: Progress towards goal 0; How Often - Daily   Target Date Goal Was reviewed Status Code Progress towards goal/Lik[place] rating  02/13/23                            This plan has been reviewed and created by the following participants:  This plan will be reviewed at least every 12 months. Date Behavioral Health Clinician Date Guardian/Patient   02/13/22 [name]                      02/13/22 Verbal consent provided  02/27/22 [name]  02/27/22  Verbal Consent provided     [name], [name]

## 2022-10-01 ENCOUNTER — Ambulatory Visit: Payer: No Typology Code available for payment source | Admitting: Psychology

## 2022-10-08 ENCOUNTER — Ambulatory Visit (INDEPENDENT_AMBULATORY_CARE_PROVIDER_SITE_OTHER): Payer: No Typology Code available for payment source | Admitting: Psychology

## 2022-10-08 DIAGNOSIS — F411 Generalized anxiety disorder: Secondary | ICD-10-CM

## 2022-10-08 DIAGNOSIS — F330 Major depressive disorder, recurrent, mild: Secondary | ICD-10-CM | POA: Diagnosis not present

## 2022-10-08 NOTE — Progress Notes (Signed)
[place] Counselor/Therapist Progress Note  Patient ID: [name], MRN: [id number],    Date: 10/08/2022  Time Spent: 12:00pm-12:53pm  Treatment Type: Individual Therapy  Pt is seen for a virtual video visit via caregility.  Pt joins from her home, reporting privacy, and counselor from her home office.     Reported Symptoms: no incidents of emotional outbursts, some anxiety, practicing daily mindfulness/relaxation, set boundary w/ son  Mental Status Exam: Appearance:  Well Groomed     Behavior: Appropriate  Motor: Normal  Speech/Language:  Normal Rate  Affect: Appropriate  Mood: normal  Thought process: normal  Thought content:   WNL  Sensory/Perceptual disturbances:   WNL  Orientation: oriented to person, place, time/date, and situation  Attention: Good  Concentration: Good  Memory: WNL  Fund of knowledge:  Good  Insight:   Good  Judgment:  Good  Impulse Control: Good   Risk Assessment: Danger to Self:  No Self-injurious Behavior: No Danger to Others: No Duty to Warn:no Physical Aggression / Violence:No  Access to Firearms a concern: No  Gang Involvement:No   Subjective: Counselor assessed pt current functioning per pt report.  Processed w/pt moods and interactions.   Reflected pt awareness of emotions and using deescalation skills for self.  Discussed continued use of reframing and self compassion statements.    Identified practice w/ setting boundary and positive outcome of.   Pt affect wnl.  Pt reported decreased anxiety overall.  Pt reports still worry, feeling of insecurity.  Pt reports that learning to not escalate w/ husband shutting down and has been able to voice wants to be able to talk about things.  Pt reports on positives of things for self and self care.  Pt reported on setting boundary w/ son of not being able to commit to week of child care for grandkids so they can go on cruise.  Pt reported went well to set  boundary.  Interventions: Cognitive Behavioral Therapy, Mindfulness Meditation, and supportive  Diagnosis:Generalized anxiety disorder  Mild episode of recurrent major depressive disorder ([place])  Plan: Pt to f/u w/ Counseling in 2 weeks. Practice mindful/grounding breathe practice and daily movement.  Reviewed crisis services if needed.  F/u as scheduled w/ PCP.  Individualized Treatment Plan Strengths: seeking counseling, enjoys getting nails done, enjoys breeding rottweiler dogs, enjoys the beach, family Sunday dinners  Supports: her husband and her mom    Goal/Needs for Treatment:  In order of importance to patient 1) cope with anxiety 2) increase self awareness      Client Statement of Needs: "Finding me again.  Learning how to control certain emotions.  Find out why something bothers me.  Where is this anxiety coming from and how to Deal with it"      Treatment Level: outpatient counseling  Symptoms:anxiety, worry, ruminating thoughts, emotional escalations, depressed mood, sleep disturbance  Client Treatment Preferences:biweekly counseling and continue medication management w/ PCP Increase to weekly counseling on 02/27/22.    Healthcare consumer's goal for treatment:   Counselor, [name], [facility] will support the patient's ability to achieve the goals identified. Cognitive Behavioral Therapy, Assertive Communication/Conflict Resolution Training, Relaxation Training, ACT, Humanistic and other evidenced-based practices will be used to promote progress towards healthy functioning.    Healthcare consumer will: Actively participate in therapy, working towards healthy functioning.     *Justification for Continuation/Discontinuation of Goal: R=Revised, O=Ongoing, A=Achieved, D=Discontinued   Goal 1) Pt will increase coping with life's anxiety and stressors AEB decreased anxiety, decreased  emotional escalations, decreased sleep disturbance, increased awareness of thought patterns  that impact anxiety. Baseline date 02/13/22: Progress towards goal 0; How Often - Daily Target Date Goal Was reviewed Status Code Progress towards goal  02/13/23                            Goal 2)  Increase self awareness and identifying her likes, values, interest as demonstrated by expressing and communicating in sessions.  Baseline date 02/13/22: Progress towards goal 0; How Often - Daily   Target Date Goal Was reviewed Status Code Progress towards goal/Likert rating  02/13/23                            This plan has been reviewed and created by the following participants:  This plan will be reviewed at least every 12 months. Date Behavioral Health Clinician Date Guardian/Patient   02/13/22 [name]                      02/13/22 Verbal consent provided  02/27/22 [name] [place] [name]  02/27/22  Verbal Consent provided     [name] [place], [place], [facility]

## 2022-10-15 ENCOUNTER — Ambulatory Visit: Payer: No Typology Code available for payment source | Admitting: Psychology

## 2022-10-17 ENCOUNTER — Other Ambulatory Visit: Payer: Self-pay | Admitting: Family Medicine

## 2022-10-17 NOTE — Telephone Encounter (Signed)
Patient requested refill   Last VV-08/07/22 Last refill-09/30/22--60 tabs, 0 refills  No future OV scheduled.

## 2022-10-18 MED ORDER — AMPHETAMINE-DEXTROAMPHETAMINE 10 MG PO TABS
10.0000 mg | ORAL_TABLET | Freq: Two times a day (BID) | ORAL | 0 refills | Status: DC
Start: 2022-10-30 — End: 2022-10-18

## 2022-10-18 MED ORDER — AMPHETAMINE-DEXTROAMPHETAMINE 10 MG PO TABS
10.0000 mg | ORAL_TABLET | Freq: Two times a day (BID) | ORAL | 0 refills | Status: DC
Start: 2022-11-29 — End: 2022-10-18

## 2022-10-18 MED ORDER — AMPHETAMINE-DEXTROAMPHETAMINE 10 MG PO TABS: 10.0000 mg | ORAL_TABLET | Freq: Two times a day (BID) | ORAL | 0 refills | Status: DC

## 2022-10-18 NOTE — Telephone Encounter (Signed)
Done

## 2022-10-19 ENCOUNTER — Encounter: Payer: Self-pay | Admitting: Family Medicine

## 2022-10-22 ENCOUNTER — Encounter: Payer: Self-pay | Admitting: Obstetrics and Gynecology

## 2022-10-22 ENCOUNTER — Ambulatory Visit: Payer: No Typology Code available for payment source | Admitting: Psychology

## 2022-10-22 ENCOUNTER — Ambulatory Visit (INDEPENDENT_AMBULATORY_CARE_PROVIDER_SITE_OTHER): Payer: No Typology Code available for payment source | Admitting: Obstetrics and Gynecology

## 2022-10-22 VITALS — BP 129/80 | HR 75

## 2022-10-22 DIAGNOSIS — R339 Retention of urine, unspecified: Secondary | ICD-10-CM

## 2022-10-22 DIAGNOSIS — N393 Stress incontinence (female) (male): Secondary | ICD-10-CM | POA: Diagnosis not present

## 2022-10-22 NOTE — Patient Instructions (Signed)
Taking Care of Yourself after Urodynamics, Cystoscopy, Coaptite Injection, or Botox Injection   Drink plenty of water for a day or two following your procedure. Try to have about 8 ounces (one cup) at a time, and do this 6 times or more per day unless you have fluid restrictitons AVOID irritative beverages such as coffee, tea, soda, alcoholic or citrus drinks for a day or two, as this may cause burning with urination.  For the first 1-2 days after the procedure, your urine may be pink or red in color. You may have some blood in your urine as a normal side effect of the procedure. Large amounts of bleeding or difficulty urinating are NOT normal. Call the nurse line if this happens or go to the nearest Emergency Room if the bleeding is heavy or you cannot urinate at all and it is after hours.  You may experience some discomfort or a burning sensation with urination after having this procedure. You can use over the counter Azo or pyridium to help with burning and follow the instructions on the packaging. If it does not improve within 1-2 days, or other symptoms appear (fever, chills, or difficulty urinating) call the office to speak to a nurse.  You may return to normal daily activities such as work, school, driving, exercising and housework on the day of the procedure.

## 2022-10-22 NOTE — Progress Notes (Signed)
[facility] Urodynamics Procedure  Referring Physician: Laurey Morale, MD Date of Procedure: 10/22/2022  [name] is a 56 y.o. female who presents for urodynamic evaluation. Indication(s) for study: SUI Has history of midurethral sling.   Vital Signs: BP 129/80   Pulse 75   LMP 12/24/2006 (Approximate)   Laboratory Results: A catheterized urine specimen revealed:  POC urine: trace leukocytes  Voiding Diary: Not performed  Procedure Timeout:  The correct patient was verified and the correct procedure was verified. The patient was in the correct position and safety precautions were reviewed based on at the patient's history.  Urodynamic Procedure A 92F dual lumen urodynamics catheter was placed under sterile conditions into the patient's bladder. A 92F catheter was placed into the rectum in order to measure abdominal pressure. EMG patches were placed in the appropriate position.  All connections were confirmed and calibrations/adjusted made. Saline was instilled into the bladder through the dual lumen catheters.  Cough/valsalva pressures were measured periodically during filling.  Patient was allowed to void.  The bladder was then emptied of its residual.  UROFLOW: Revealed a Qmax of 11 mL/sec.  She voided 67 mL and had a residual of 275 mL.  It was a normal pattern and represented normal habits though interpretation limited due to low voided volume.  CMG: This was performed with sterile water in the sitting position at a fill rate of 20- 30 mL/min.    First sensation of fullness was 74.6 mLs,  First urge was 132.3 mLs,  Strong urge was 199 mLs and  Capacity was 731 mLs  Stress incontinence was demonstrated  Highest negative Barrier CLPP was 113 cmH20 at 200 ml. Lowest positive Barrier VLPP was 82 cmH20 at 200 ml.  Detrusor function was normal, with no phasic contractions seen.    Compliance:  normal. End fill detrusor pressure was 7.7cmH20.   Calculated compliance was 94.51m/cmH20  UPP: MUCP without barrier reduction was 95 cm of water.    MICTURITION STUDY: Voiding was performed with reduction using scopettes in the sitting position.  Pdet at Qmax was 31.7 cm of water.  Qmax was 30.2 mL/sec.  It was a normal pattern.  She voided 556 mL and had a residual of 175 mL.  It was a volitional void, sustained detrusor contraction was present and abdominal straining was not present  EMG: This was performed with patches.  She had voluntary contractions, recruitment with fill was not present and urethral sphincter was not relaxed with void.  The details of the procedure with the study tracings have been scanned into EPIC.   Urodynamic Impression:  1. Sensation was increased; capacity was normal 2. Stress Incontinence was demonstrated at normal pressures; 3. Detrusor Overactivity was not demonstrated. 4. Emptying was dysfunctional with a elevated PVR on initial void (PVR 2770m and improved at the end of the study with slightly elevated PVR (17545m a sustained detrusor contraction present,  abdominal straining not present, dyssynergic urethral sphincter activity on EMG.  Plan: - The patient will follow up  to discuss the findings and treatment options.   MicJaquita FoldsD

## 2022-10-30 ENCOUNTER — Ambulatory Visit: Payer: No Typology Code available for payment source | Admitting: Psychology

## 2022-10-30 DIAGNOSIS — F411 Generalized anxiety disorder: Secondary | ICD-10-CM | POA: Diagnosis not present

## 2022-10-31 NOTE — Progress Notes (Addendum)
[place] Counselor/Therapist Progress Note  Patient ID: [name], MRN: [id number],    Date: 10/30/22  Time Spent: 12:01pm-12:58pm  Treatment Type: Individual Therapy  Pt is seen for a virtual video visit via caregility.  Pt joins from her home, reporting privacy, and counselor from her home office.     Reported Symptoms: no incidents of emotional outbursts, some anxiety, practicing daily mindfulness/relaxation, continuing to set boundary w/ son  Mental Status Exam: Appearance:  Well Groomed     Behavior: Appropriate  Motor: Normal  Speech/Language:  Normal Rate  Affect: Appropriate  Mood: normal  Thought process: normal  Thought content:   WNL  Sensory/Perceptual disturbances:   WNL  Orientation: oriented to person, place, time/date, and situation  Attention: Good  Concentration: Good  Memory: WNL  Fund of knowledge:  Good  Insight:   Good  Judgment:  Good  Impulse Control: Good   Risk Assessment: Danger to Self:  No Self-injurious Behavior: No Danger to Others: No Duty to Warn:no Physical Aggression / Violence:No  Access to Firearms a concern: No  Gang Involvement:No   Subjective: Counselor assessed pt current functioning per pt report.  Processed w/pt positives, stressors and coping skills used.  Reflected pt awareness of emotions escalating and negative self talk and use of deescalation skills for self.  Explored benefit of daily activity.  Discussed guilt that emerges w/ setting boundary and saying no- and explored refames for self.    Pt affect wnl.  Pt reported some stressors w/ work, husband's business, upcoming surgery.  Pt reports some incidents of insecurity, negative self talk and emotions beginning to escalate.  Pt reports she was able to not engage and work on grounding and conflicts resolved.  Pt reported that she has dealt w/ guilt of setting boundary w/ son and normalized process of change for self.  Pt is able to reframe ok to say  no for self care and doesn't mean bad mom/person.   Interventions: Cognitive Behavioral Therapy, Mindfulness Meditation, and supportive  Diagnosis:Generalized anxiety disorder  Plan: Pt to f/u w/ Counseling in 2 weeks. Practice mindful/grounding breathe practice and daily movement.  Reviewed crisis services if needed.  F/u as scheduled w/ PCP.  Individualized Treatment Plan Strengths: seeking counseling, enjoys getting nails done, enjoys breeding rottweiler dogs, enjoys the beach, family Sunday dinners  Supports: her husband and her mom    Goal/Needs for Treatment:  In order of importance to patient 1) cope with anxiety 2) increase self awareness      Client Statement of Needs: "Finding me again.  Learning how to control certain emotions.  Find out why something bothers me.  Where is this anxiety coming from and how to Deal with it"      Treatment Level: outpatient counseling  Symptoms:anxiety, worry, ruminating thoughts, emotional escalations, depressed mood, sleep disturbance  Client Treatment Preferences:biweekly counseling and continue medication management w/ PCP Increase to weekly counseling on 02/27/22.    Healthcare consumer's goal for treatment:   Counselor, [name], [facility] will support the patient's ability to achieve the goals identified. Cognitive Behavioral Therapy, Assertive Communication/Conflict Resolution Training, Relaxation Training, ACT, Humanistic and other evidenced-based practices will be used to promote progress towards healthy functioning.    Healthcare consumer will: Actively participate in therapy, working towards healthy functioning.     *Justification for Continuation/Discontinuation of Goal: R=Revised, O=Ongoing, A=Achieved, D=Discontinued   Goal 1) Pt will increase coping with life's anxiety and stressors AEB decreased anxiety, decreased emotional escalations, decreased  sleep disturbance, increased awareness of thought patterns that impact  anxiety. Baseline date 02/13/22: Progress towards goal 0; How Often - Daily Target Date Goal Was reviewed Status Code Progress towards goal  02/13/23                            Goal 2)  Increase self awareness and identifying her likes, values, interest as demonstrated by expressing and communicating in sessions.  Baseline date 02/13/22: Progress towards goal 0; How Often - Daily   Target Date Goal Was reviewed Status Code Progress towards goal/Likert rating  02/13/23                            This plan has been reviewed and created by the following participants:  This plan will be reviewed at least every 12 months. Date Behavioral Health Clinician Date Guardian/Patient   02/13/22 [name]                      02/13/22 Verbal consent provided  02/27/22 [name]  02/27/22  Verbal Consent provided          [name] [facility]

## 2022-11-05 ENCOUNTER — Ambulatory Visit: Payer: No Typology Code available for payment source | Admitting: Psychology

## 2022-11-05 ENCOUNTER — Ambulatory Visit: Payer: No Typology Code available for payment source | Admitting: Obstetrics and Gynecology

## 2022-11-13 ENCOUNTER — Telehealth (INDEPENDENT_AMBULATORY_CARE_PROVIDER_SITE_OTHER): Payer: No Typology Code available for payment source | Admitting: Family Medicine

## 2022-11-13 ENCOUNTER — Encounter: Payer: Self-pay | Admitting: Family Medicine

## 2022-11-13 DIAGNOSIS — F419 Anxiety disorder, unspecified: Secondary | ICD-10-CM

## 2022-11-13 DIAGNOSIS — F32A Depression, unspecified: Secondary | ICD-10-CM

## 2022-11-13 DIAGNOSIS — E611 Iron deficiency: Secondary | ICD-10-CM | POA: Diagnosis not present

## 2022-11-13 NOTE — Progress Notes (Signed)
Subjective:    Patient ID: [name], female    DOB: 03-Dec-1966, 56 y.o.   MRN: [id number]  HPI Virtual Visit via Video Note  I connected with the patient on 11/13/22 at 10:00 AM EST by a video enabled telemedicine application and verified that I am speaking with the correct person using two identifiers.  Location patient: home Location provider:work or home office Persons participating in the virtual visit: patient, provider  I discussed the limitations of evaluation and management by telemedicine and the availability of in person appointments. The patient expressed understanding and agreed to proceed.   HPI: Here for several issues. First she is interested in checking her iron levels. Her Hgb has always been stable (it was 13.1 in July), but her mother has had low iron. Also she wants to wean off Prozac if possible. Her moods have been stable.    ROS: See pertinent positives and negatives per HPI.  Past Medical History:  Diagnosis Date   Allergy    Anxiety    Depression    GERD (gastroesophageal reflux disease)    History of abnormal cervical Pap smear    1991 -- 1995--hx cryotherapy to cervix   History of colon polyps    2008- BENIGN   Hydronephrosis, left    Kidney stones 2016   PONV (postoperative nausea and vomiting)    Rosacea     Past Surgical History:  Procedure Laterality Date   ABDOMINAL HYSTERECTOMY     ANTERIOR AND POSTERIOR REPAIR WITH SACROSPINOUS FIXATION N/A 08/16/2015   Procedure: ANTERIOR COLPORRHAPHY WITH XENOFORM GRAFT AND SACROSPINOUS FIXATION;  Surgeon: Nunzio Cobbs, MD;  Location: [facility];  Service: Gynecology;  Laterality: N/A;  2.5 hours OR time   BLADDER SUSPENSION N/A 08/16/2015   Procedure: TRANSVAGINAL TAPE (TVT) PROCEDURE exact midurethral sling;  Surgeon: Nunzio Cobbs, MD;  Location: [facility];  Service: Gynecology;  Laterality: N/A;   COLONOSCOPY  08/21/2017   per Dr. Ardis Hughs, clear, repeat in 10 yrs    CYSTOSCOPY N/A 08/16/2015   Procedure: CYSTOSCOPY;  Surgeon: Nunzio Cobbs, MD;  Location: [facility];  Service: Gynecology;  Laterality: N/A;   CYSTOSCOPY W/ RETROGRADES Bilateral 06/22/2015   Procedure: CYSTOSCOPY WITH RETROGRADE PYELOGRAM;  Surgeon: Cleon Gustin, MD;  Location: [facility];  Service: Urology;  Laterality: Bilateral;   CYSTOSCOPY W/ URETERAL STENT PLACEMENT  01/1999   CYSTOSCOPY WITH HOLMIUM LASER LITHOTRIPSY Left 05/18/2015   Procedure: CYSTOSCOPY WITH HOLMIUM LASER LITHOTRIPSY;  Surgeon: Alexis Frock, MD;  Location: [facility];  Service: Urology;  Laterality: Left;   CYSTOSCOPY WITH RETROGRADE PYELOGRAM, URETEROSCOPY AND STENT PLACEMENT Left 06/22/2015   Procedure: CYSTOSCOPY,  LEFT URETEROSCOPY;  Surgeon: Cleon Gustin, MD;  Location: [facility];  Service: Urology;  Laterality: Left;   CYSTOSCOPY WITH URETEROSCOPY AND STENT PLACEMENT Left 05/18/2015   Procedure: CYSTOSCOPY WITH URETEROSCOPY, STONE MANIPULATION AND STENT PLACEMENT;  Surgeon: Alexis Frock, MD;  Location: [facility];  Service: Urology;  Laterality: Left;   DIAGNOSTIC LAPAROSCOPY     DX LAPAROSCOPY  X2   ESOPHAGOGASTRODUODENOSCOPY  05/09/2007   EXPLORATORY LAPAROTOMY W/ BILATERAL SALPINGECTOMY AND REMOVAL RIGHT ECTOPIC PREG.  02/23/2004   EXTRACORPOREAL SHOCK WAVE LITHOTRIPSY  2001  &  2002   LAPAROSCOPIC CHOLECYSTECTOMY  1999   POLYPECTOMY     SHOULDER ARTHROSCOPY WITH OPEN ROTATOR CUFF REPAIR Right 2012   TOTAL ABDOMINAL HYSTERECTOMY W/ BILATERAL OOPHORECTOMY AND  LYSIS ADHESIONS  09/02/2007   TUBAL LIGATION Bilateral 8563   UMBILICAL HERNIA REPAIR  2003   unsure if she has mesh   VAGINAL HYSTERECTOMY N/A 08/18/2015   Procedure: Exam under Anesthesia, Excision Xenform Graft, Removal Bilateral Sacrospinous Ligament sutures;  Surgeon: Nunzio Cobbs, MD;  Location: [facility];  Service: Gynecology;  Laterality: N/A;     Family History  Problem Relation Age of Onset   Kidney disease Mother    Hypertension Mother    Colon polyps Mother    Diabetes Father    Colon polyps Father    Drug abuse Sister    Cancer Paternal Uncle        Pancreas   Pancreatic cancer Paternal Uncle    Heart attack Maternal Grandmother    Breast cancer Paternal Grandmother    Esophageal cancer Neg Hx    Stomach cancer Neg Hx    Rectal cancer Neg Hx    Colon cancer Neg Hx      Current Outpatient Medications:    ALPRAZolam (XANAX) 1 MG tablet, Take 1 tablet by mouth 2 (two) times daily as needed., Disp: , Rfl:    [START ON 12/30/2022] amphetamine-dextroamphetamine (ADDERALL) 10 MG tablet, Take 1 tablet (10 mg total) by mouth 2 (two) times daily., Disp: 60 tablet, Rfl: 0   diazepam (VALIUM) 5 MG tablet, Place 1 tablet vaginally nightly as needed for muscle spasm/ pelvic pain., Disp: 30 tablet, Rfl: 0   doxycycline (VIBRAMYCIN) 100 MG capsule, Take 100 mg by mouth daily., Disp: , Rfl:    estradiol (ESTRACE) 0.1 MG/GM vaginal cream, Use 1/2 g vaginally every night for the first 2 weeks, then use 1/2 g vaginally two or three times per week as needed to maintain symptom relief., Disp: 42.5 g, Rfl: 2   FLUoxetine (PROZAC) 20 MG capsule, Take 1 capsule (20 mg total) by mouth daily., Disp: 90 capsule, Rfl: 3   metroNIDAZOLE (METROCREAM) 0.75 % cream, Apply topically 2 (two) times daily., Disp: 45 g, Rfl: 3   Multiple Vitamin (MULTIVITAMIN WITH MINERALS) TABS tablet, Take 1 tablet by mouth daily., Disp: , Rfl:    pramipexole (MIRAPEX) 0.5 MG tablet, Take 1 tablet (0.5 mg total) by mouth in the morning and at bedtime., Disp: 180 tablet, Rfl: 3   solifenacin (VESICARE) 5 MG tablet, Take 1 tablet (5 mg total) by mouth daily., Disp: 30 tablet, Rfl: 5  EXAM:  VITALS per patient if applicable:  GENERAL: alert, oriented, appears well and in no acute distress  HEENT: atraumatic, conjunttiva clear, no obvious abnormalities on inspection of  external nose and ears  NECK: normal movements of the head and neck  LUNGS: on inspection no signs of respiratory distress, breathing rate appears normal, no obvious gross SOB, gasping or wheezing  CV: no obvious cyanosis  MS: moves all visible extremities without noticeable abnormality  PSYCH/NEURO: pleasant and cooperative, no obvious depression or anxiety, speech and thought processing grossly intact  ASSESSMENT AND PLAN: Her anxiety and depression are stable. We will decrease the Prozac to 10 mg daily. She will report back in 2 weeks. For the iron question, she will schedule labs to check a CBC, iron, and ferritin soon.  Alysia Penna, MD  Discussed the following assessment and plan:  No diagnosis found.     I discussed the assessment and treatment plan with the patient. The patient was provided an opportunity to ask questions and all were answered. The patient agreed with the plan and demonstrated  an understanding of the instructions.   The patient was advised to call back or seek an in-person evaluation if the symptoms worsen or if the condition fails to improve as anticipated.      Review of Systems     Objective:   Physical Exam        Assessment & Plan:

## 2022-11-14 ENCOUNTER — Other Ambulatory Visit (INDEPENDENT_AMBULATORY_CARE_PROVIDER_SITE_OTHER): Payer: No Typology Code available for payment source

## 2022-11-14 DIAGNOSIS — E611 Iron deficiency: Secondary | ICD-10-CM | POA: Diagnosis not present

## 2022-11-14 LAB — CBC WITH DIFFERENTIAL/PLATELET
Basophils Absolute: 0 10*3/uL (ref 0.0–0.1)
Basophils Relative: 0.3 % (ref 0.0–3.0)
Eosinophils Absolute: 0 10*3/uL (ref 0.0–0.7)
Eosinophils Relative: 0.7 % (ref 0.0–5.0)
HCT: 39.7 % (ref 36.0–46.0)
Hemoglobin: 13.4 g/dL (ref 12.0–15.0)
Lymphocytes Relative: 37.6 % (ref 12.0–46.0)
Lymphs Abs: 1.9 10*3/uL (ref 0.7–4.0)
MCHC: 33.7 g/dL (ref 30.0–36.0)
MCV: 87.1 fl (ref 78.0–100.0)
Monocytes Absolute: 0.5 10*3/uL (ref 0.1–1.0)
Monocytes Relative: 10.3 % (ref 3.0–12.0)
Neutro Abs: 2.6 10*3/uL (ref 1.4–7.7)
Neutrophils Relative %: 51.1 % (ref 43.0–77.0)
Platelets: 227 10*3/uL (ref 150.0–400.0)
RBC: 4.55 Mil/uL (ref 3.87–5.11)
RDW: 13.4 % (ref 11.5–15.5)
WBC: 5 10*3/uL (ref 4.0–10.5)

## 2022-11-14 LAB — IBC + FERRITIN
Ferritin: 87.8 ng/mL (ref 10.0–291.0)
Iron: 89 ug/dL (ref 42–145)
Saturation Ratios: 27.3 % (ref 20.0–50.0)
TIBC: 326.2 ug/dL (ref 250.0–450.0)
Transferrin: 233 mg/dL (ref 212.0–360.0)

## 2022-11-19 ENCOUNTER — Ambulatory Visit (INDEPENDENT_AMBULATORY_CARE_PROVIDER_SITE_OTHER): Payer: No Typology Code available for payment source | Admitting: Psychology

## 2022-11-19 DIAGNOSIS — F330 Major depressive disorder, recurrent, mild: Secondary | ICD-10-CM

## 2022-11-19 DIAGNOSIS — F411 Generalized anxiety disorder: Secondary | ICD-10-CM

## 2022-11-19 NOTE — Progress Notes (Signed)
[place] Counselor/Therapist Progress Note  Patient ID: [name], MRN: [id number],    Date: 11/19/22   Time Spent: 12:02pm-12:56pm  Treatment Type: Individual Therapy  Pt is seen for a virtual video visit via caregility.  Pt joins from her home, reporting privacy, and counselor from her home office.     Reported Symptoms: no incidents of emotional outbursts, some worry, practicing daily mindfulness/relaxation, continuing to set boundary w/ son  Mental Status Exam: Appearance:  Well Groomed     Behavior: Appropriate  Motor: Normal  Speech/Language:  Normal Rate  Affect: Appropriate  Mood: normal  Thought process: normal  Thought content:   WNL  Sensory/Perceptual disturbances:   WNL  Orientation: oriented to person, place, time/date, and situation  Attention: Good  Concentration: Good  Memory: WNL  Fund of knowledge:  Good  Insight:   Good  Judgment:  Good  Impulse Control: Good   Risk Assessment: Danger to Self:  No Self-injurious Behavior: No Danger to Others: No Duty to Warn:no Physical Aggression / Violence:No  Access to Firearms a concern: No  Gang Involvement:No   Subjective: Counselor assessed pt current functioning per pt report.  Processed w/pt holidays and interactions and coping skills.  Reflected pt awareness of distortions and challenging and reframing. Explored worry of loved ones death and how related to hx experienced.  Assisted pt in acknowledging and how to reframe and focus on what's in her control.  Discussed relationship shifting w/ son and ways of connecting. Pt affect wnl.  Pt reported on her thanksgiving holiday and positives.  Pt reported on stressors w/ worry re: son's approaching age that family members have died.  Pt discussed distortions and able to reframe.  Pt reported on self care and coping skills using.  Pt discussed some hurt by lack of son's engagement w/ family.  Pt discussed wants for connection and ways  of extending.      Interventions: Cognitive Behavioral Therapy, Mindfulness Meditation, and supportive  Diagnosis:Generalized anxiety disorder  Mild episode of recurrent major depressive disorder ([place])  Plan: Pt to f/u w/ Counseling in 2 weeks. Practice mindful/grounding breathe practice and daily movement.  Reviewed crisis services if needed.  F/u as scheduled w/ PCP.  Individualized Treatment Plan Strengths: seeking counseling, enjoys getting nails done, enjoys breeding rottweiler dogs, enjoys the beach, family Sunday dinners  Supports: her husband and her mom    Goal/Needs for Treatment:  In order of importance to patient 1) cope with anxiety 2) increase self awareness      Client Statement of Needs: "Finding me again.  Learning how to control certain emotions.  Find out why something bothers me.  Where is this anxiety coming from and how to Deal with it"      Treatment Level: outpatient counseling  Symptoms:anxiety, worry, ruminating thoughts, emotional escalations, depressed mood, sleep disturbance  Client Treatment Preferences:biweekly counseling and continue medication management w/ PCP Increase to weekly counseling on 02/27/22.    Healthcare consumer's goal for treatment:   Counselor, [name], [facility] will support the patient's ability to achieve the goals identified. Cognitive Behavioral Therapy, Assertive Communication/Conflict Resolution Training, Relaxation Training, ACT, Humanistic and other evidenced-based practices will be used to promote progress towards healthy functioning.    Healthcare consumer will: Actively participate in therapy, working towards healthy functioning.     *Justification for Continuation/Discontinuation of Goal: R=Revised, O=Ongoing, A=Achieved, D=Discontinued   Goal 1) Pt will increase coping with life's anxiety and stressors AEB decreased  anxiety, decreased emotional escalations, decreased sleep disturbance, increased awareness of thought  patterns that impact anxiety. Baseline date 02/13/22: Progress towards goal 0; How Often - Daily Target Date Goal Was reviewed Status Code Progress towards goal  02/13/23                            Goal 2)  Increase self awareness and identifying her likes, values, interest as demonstrated by expressing and communicating in sessions.  Baseline date 02/13/22: Progress towards goal 0; How Often - Daily   Target Date Goal Was reviewed Status Code Progress towards goal/Likert rating  02/13/23                            This plan has been reviewed and created by the following participants:  This plan will be reviewed at least every 12 months. Date Behavioral Health Clinician Date Guardian/Patient   02/13/22 [facility] [name]                      02/13/22 Verbal consent provided  02/27/22 [name] [place] [name]  02/27/22  Verbal Consent provided       [name] [facility]

## 2022-11-27 ENCOUNTER — Ambulatory Visit (INDEPENDENT_AMBULATORY_CARE_PROVIDER_SITE_OTHER): Payer: No Typology Code available for payment source | Admitting: Obstetrics and Gynecology

## 2022-11-27 ENCOUNTER — Encounter: Payer: Self-pay | Admitting: Obstetrics and Gynecology

## 2022-11-27 VITALS — BP 144/84 | HR 70

## 2022-11-27 DIAGNOSIS — Z01818 Encounter for other preprocedural examination: Secondary | ICD-10-CM

## 2022-11-27 DIAGNOSIS — N993 Prolapse of vaginal vault after hysterectomy: Secondary | ICD-10-CM

## 2022-11-27 DIAGNOSIS — R32 Unspecified urinary incontinence: Secondary | ICD-10-CM

## 2022-11-27 MED ORDER — OXYCODONE HCL 5 MG PO TABS: 5.0000 mg | ORAL_TABLET | ORAL | 0 refills | Status: DC | PRN

## 2022-11-27 MED ORDER — ACETAMINOPHEN 500 MG PO TABS: 500.0000 mg | ORAL_TABLET | Freq: Four times a day (QID) | ORAL | 0 refills | Status: DC | PRN

## 2022-11-27 MED ORDER — IBUPROFEN 600 MG PO TABS: 600.0000 mg | ORAL_TABLET | Freq: Four times a day (QID) | ORAL | 0 refills | Status: DC | PRN

## 2022-11-27 MED ORDER — POLYETHYLENE GLYCOL 3350 17 GM/SCOOP PO POWD: 17.0000 g | Freq: Every day | ORAL | 0 refills | Status: DC

## 2022-11-27 NOTE — Progress Notes (Signed)
[facility] Urogynecology Pre-Operative visit  Subjective Chief Complaint: [name] presents for a preoperative encounter.   History of Present Illness: [name] is a 56 y.o. female who presents for preoperative visit.  She is scheduled to undergo Exam under anesthesia, Robotic assisted sacrocolpopexy, cystoscopy on 12/12/22.  Her symptoms include vaginal bulge and urinary incontinence, and she was was found to have Stage III anterior, Stage I posterior, Stage I apical prolapse. She has a history of native tissue prolapse repair and sling.   Urodynamic Impression:  1. Sensation was increased; capacity was normal 2. Stress Incontinence was demonstrated at normal pressures; 3. Detrusor Overactivity was not demonstrated. 4. Emptying was dysfunctional with a elevated PVR on initial void (PVR 212m) and improved at the end of the study with slightly elevated PVR (1770m, a sustained detrusor contraction present,  abdominal straining not present, dyssynergic urethral sphincter activity on EMG.   Past Medical History:  Diagnosis Date   Allergy    Anxiety    Depression    GERD (gastroesophageal reflux disease)    History of abnormal cervical Pap smear    1991 -- 1995--hx cryotherapy to cervix   History of colon polyps    2008- BENIGN   Hydronephrosis, left    Kidney stones 2016   PONV (postoperative nausea and vomiting)    Rosacea      Past Surgical History:  Procedure Laterality Date   ABDOMINAL HYSTERECTOMY     ANTERIOR AND POSTERIOR REPAIR WITH SACROSPINOUS FIXATION N/A 08/16/2015   Procedure: ANTERIOR COLPORRHAPHY WITH XENOFORM GRAFT AND SACROSPINOUS FIXATION;  Surgeon: BrNunzio CobbsMD;  [facility];  Service: Gynecology;  Laterality: N/A;  2.5 hours OR time   BLADDER SUSPENSION N/A 08/16/2015   Procedure: TRANSVAGINAL TAPE (TVT) PROCEDURE exact midurethral sling;  Surgeon: BrNunzio CobbsMD;  [facility];  Service:  Gynecology;  Laterality: N/A;   COLONOSCOPY  08/21/2017   per Dr. JaArdis Hughsclear, repeat in 10 yrs   CYSTOSCOPY N/A 08/16/2015   Procedure: CYSTOSCOPY;  Surgeon: BrNunzio CobbsMD;  [facility];  Service: Gynecology;  Laterality: N/A;   CYSTOSCOPY W/ RETROGRADES Bilateral 06/22/2015   Procedure: CYSTOSCOPY WITH RETROGRADE PYELOGRAM;  Surgeon: PaCleon GustinMD;  [facility];  Laterality: Bilateral;   CYSTOSCOPY W/ URETERAL STENT PLACEMENT  01/1999   CYSTOSCOPY WITH HOLMIUM LASER LITHOTRIPSY Left 05/18/2015   Procedure: CYSTOSCOPY WITH HOLMIUM LASER LITHOTRIPSY;  Surgeon: ThAlexis FrockMD;  [facility];  Laterality: Left;   CYSTOSCOPY WITH RETROGRADE PYELOGRAM, URETEROSCOPY AND STENT PLACEMENT Left 06/22/2015   Procedure: CYSTOSCOPY,  LEFT URETEROSCOPY;  Surgeon: PaCleon GustinMD;  [facility];  Laterality: Left;   CYSTOSCOPY WITH URETEROSCOPY AND STENT PLACEMENT Left 05/18/2015   Procedure: CYSTOSCOPY WITH URETEROSCOPY, STONE MANIPULATION AND STENT PLACEMENT;  Surgeon: ThAlexis FrockMD;  [facility];  Laterality: Left;   DIAGNOSTIC LAPAROSCOPY     DX LAPAROSCOPY  X2   ESOPHAGOGASTRODUODENOSCOPY  05/09/2007   EXPLORATORY LAPAROTOMY W/ BILATERAL SALPINGECTOMY AND REMOVAL RIGHT ECTOPIC PREG.  02/23/2004   EXTRACORPOREAL SHOCK WAVE LITHOTRIPSY  2001  &  2002   LAPAROSCOPIC CHOLECYSTECTOMY  1999   POLYPECTOMY     SHOULDER ARTHROSCOPY WITH OPEN ROTATOR CUFF REPAIR Right 2012   TOTAL ABDOMINAL HYSTERECTOMY W/ BILATERAL OOPHORECTOMY AND LYSIS ADHESIONS  09/02/2007  TUBAL LIGATION Bilateral 7482   UMBILICAL HERNIA REPAIR  2003   unsure if she has mesh   VAGINAL HYSTERECTOMY N/A 08/18/2015   Procedure: Exam under Anesthesia, Excision Xenform Graft, Removal Bilateral Sacrospinous Ligament sutures;   Surgeon: Nunzio Cobbs, MD;  Location: [facility];  Service: Gynecology;  Laterality: N/A;    is allergic to desvenlafaxine, prochlorperazine, and iodinated contrast media.   Family History  Problem Relation Age of Onset   Kidney disease Mother    Hypertension Mother    Colon polyps Mother    Diabetes Father    Colon polyps Father    Drug abuse Sister    Cancer Paternal Uncle        Pancreas   Pancreatic cancer Paternal Uncle    Heart attack Maternal Grandmother    Breast cancer Paternal Grandmother    Esophageal cancer Neg Hx    Stomach cancer Neg Hx    Rectal cancer Neg Hx    Colon cancer Neg Hx     Social History   Tobacco Use   Smoking status: Former    Years: 15.00    Types: Cigarettes    Quit date: 05/16/2008    Years since quitting: 14.5   Smokeless tobacco: Never  Vaping Use   Vaping Use: Never used  Substance Use Topics   Alcohol use: No    Alcohol/week: 0.0 standard drinks of alcohol   Drug use: No     Review of Systems was negative for a full 10 system review except as noted in the History of Present Illness.   Current Outpatient Medications:    ALPRAZolam (XANAX) 1 MG tablet, Take 1 tablet by mouth 2 (two) times daily as needed., Disp: , Rfl:    [START ON 12/30/2022] amphetamine-dextroamphetamine (ADDERALL) 10 MG tablet, Take 1 tablet (10 mg total) by mouth 2 (two) times daily., Disp: 60 tablet, Rfl: 0   diazepam (VALIUM) 5 MG tablet, Place 1 tablet vaginally nightly as needed for muscle spasm/ pelvic pain., Disp: 30 tablet, Rfl: 0   doxycycline (VIBRAMYCIN) 100 MG capsule, Take 100 mg by mouth daily., Disp: , Rfl:    estradiol (ESTRACE) 0.1 MG/GM vaginal cream, Use 1/2 g vaginally every night for the first 2 weeks, then use 1/2 g vaginally two or three times per week as needed to maintain symptom relief., Disp: 42.5 g, Rfl: 2   FLUoxetine (PROZAC) 20 MG capsule, Take 1 capsule (20 mg total) by mouth daily., Disp: 90 capsule, Rfl: 3   metroNIDAZOLE  (METROCREAM) 0.75 % cream, Apply topically 2 (two) times daily., Disp: 45 g, Rfl: 3   Multiple Vitamin (MULTIVITAMIN WITH MINERALS) TABS tablet, Take 1 tablet by mouth daily., Disp: , Rfl:    pramipexole (MIRAPEX) 0.5 MG tablet, Take 1 tablet (0.5 mg total) by mouth in the morning and at bedtime., Disp: 180 tablet, Rfl: 3   solifenacin (VESICARE) 5 MG tablet, Take 1 tablet (5 mg total) by mouth daily., Disp: 30 tablet, Rfl: 5   Objective Vitals:   11/27/22 0800  BP: (!) 144/84  Pulse: 70    Gen: NAD CV: S1 S2 RRR Lungs: Clear to auscultation bilaterally Abd: soft, nontender   Previous Pelvic Exam showed: POP-Q:    POP-Q   1.5  Aa   1.5                                           Ba   -4                                              C    5                                            Gh   4.5                                            Pb   6.5                                            tvl    -2                                            Ap   -2                                            Bp                                                  D       Assessment/ Plan  Assessment: The patient is a 56 y.o. year old scheduled to undergo Exam under anesthesia, Robotic assisted sacrocolpopexy, cystoscopy. Verbal consent was obtained for these procedures.  Plan:  Reviewed urodynamics and due to incomplete emptying will defer further treatment for SUI at this time. We will need to reassess SUI post operatively and may need an interval procedure.   General Surgical Consent: The patient has previously been counseled on alternative treatments, and the decision by the patient and provider was to proceed with the procedure listed above.  For all procedures, there are risks of bleeding, infection, damage to surrounding organs including but not limited to bowel, bladder, blood vessels, ureters and nerves, and need for further surgery if an  injury were to occur. These risks are all low with minimally invasive surgery.   There are risks of numbness and weakness at any body site or buttock/rectal pain.  It is possible that baseline pain can be worsened by surgery, either with or without mesh. If surgery is vaginal, there is also a low risk of possible conversion to laparoscopy or open abdominal incision where indicated. Very rare risks include blood transfusion, blood clot, heart attack, pneumonia, or death.   There is also  a risk of short-term postoperative urinary retention with need to use a catheter. About half of patients need to go home from surgery with a catheter, which is then later removed in the office. The risk of long-term need for a catheter is very low. There is also a risk of worsening of overactive bladder.    Prolapse (with or without mesh): Risk factors for surgical failure  include things that put pressure on your pelvis and the surgical repair, including obesity, chronic cough, and heavy lifting or straining (including lifting children or adults, straining on the toilet, or lifting heavy objects such as furniture or anything weighing >25 lbs. Risks of recurrence is 20-30% with vaginal native tissue repair and a less than 10% with sacrocolpopexy with mesh.    Sacrocolpopexy: Mesh implants may provide more prolapse support, but do have some unique risks to consider. It is important to understand that mesh is permanent and cannot be easily removed. Risks of abdominal sacrocolpopexy mesh include mesh exposure (~3-6%), painful intercourse (recent studies show lower rates after surgery compared to before, with ~5-8% risk of new onset), and very rare risks of bowel or bladder injury or infection (<1%). The risk of mesh exposure is more likely in a woman with risks for poor healing (prior radiation, poorly controlled diabetes, or immunocompromised). The risk of new or worsened chronic pain after mesh implant is more common in women  with baseline chronic pain and/or poorly controlled anxiety or depression. There is an FDA safety notification on vaginal mesh procedures for prolapse but NOT abdominal mesh procedures and therefore does not apply to your surgery. We have extensive experience and training with mesh placement and we have close postoperative follow up to identify any potential complications from mesh.    We discussed consent for blood products. Risks for blood transfusion include allergic reactions, other reactions that can affect different body organs and managed accordingly, transmission of infectious diseases such as HIV or Hepatitis. However, the blood is screened. Patient consents for blood products.  Pre-operative instructions:  She was instructed to not take Aspirin/NSAIDs x 7days prior to surgery. Antibiotic prophylaxis was ordered as indicated.  Catheter use: Patient will go home with foley if needed after post-operative voiding trial.  Post-operative instructions:  She was provided with specific post-operative instructions, including precautions and signs/symptoms for which we would recommend contacting us, in addition to daytime and after-hours contact phone numbers. This was provided on a handout.   Post-operative medications: Prescriptions for motrin, tylenol, miralax, and oxycodone were sent to her pharmacy. Discussed using ibuprofen and tylenol on a schedule to limit use of narcotics.   Laboratory testing:  no labs  Preoperative clearance:  She does not require surgical clearance.    Post-operative follow-up:  A post-operative appointment will be made for 6 weeks from the date of surgery. If she needs a post-operative nurse visit for a voiding trial, that will be set up after she leaves the hospital.    Patient will call the clinic or use MyChart should anything change or any new issues arise.   Jaquita Folds, MD   Time spent: I spent 20 minutes dedicated to the care of this patient on the  date of this encounter to include pre-visit review of records, face-to-face time with the patient and post visit documentation and ordering medication/ testing.

## 2022-11-27 NOTE — H&P (Signed)
[facility] Pre-Operative H&P  Subjective Chief Complaint: [name] presents for a preoperative encounter.   History of Present Illness: [name] is a 56 y.o. female who presents for preoperative visit.  She is scheduled to undergo Exam under anesthesia, Robotic assisted sacrocolpopexy, cystoscopy on 12/12/22.  Her symptoms include vaginal bulge and urinary incontinence, and she was was found to have Stage III anterior, Stage I posterior, Stage I apical prolapse. She has a history of native tissue prolapse repair and sling.   Urodynamic Impression:  1. Sensation was increased; capacity was normal 2. Stress Incontinence was demonstrated at normal pressures; 3. Detrusor Overactivity was not demonstrated. 4. Emptying was dysfunctional with a elevated PVR on initial void (PVR 227m) and improved at the end of the study with slightly elevated PVR (1740m, a sustained detrusor contraction present,  abdominal straining not present, dyssynergic urethral sphincter activity on EMG.   Past Medical History:  Diagnosis Date   Allergy    Anxiety    Depression    GERD (gastroesophageal reflux disease)    History of abnormal cervical Pap smear    1991 -- 1995--hx cryotherapy to cervix   History of colon polyps    2008- BENIGN   Hydronephrosis, left    Kidney stones 2016   PONV (postoperative nausea and vomiting)    Rosacea      Past Surgical History:  Procedure Laterality Date   ABDOMINAL HYSTERECTOMY     ANTERIOR AND POSTERIOR REPAIR WITH SACROSPINOUS FIXATION N/A 08/16/2015   Procedure: ANTERIOR COLPORRHAPHY WITH XENOFORM GRAFT AND SACROSPINOUS FIXATION;  Surgeon: BrNunzio CobbsMD;  [facility];  Service: Gynecology;  Laterality: N/A;  2.5 hours OR time   BLADDER SUSPENSION N/A 08/16/2015   Procedure: TRANSVAGINAL TAPE (TVT) PROCEDURE exact midurethral sling;  Surgeon: BrNunzio CobbsMD;  [facility];  Service:  Gynecology;  Laterality: N/A;   COLONOSCOPY  08/21/2017   per Dr. JaArdis Hughsclear, repeat in 10 yrs   CYSTOSCOPY N/A 08/16/2015   Procedure: CYSTOSCOPY;  Surgeon: BrNunzio CobbsMD;  [facility];  Service: Gynecology;  Laterality: N/A;   CYSTOSCOPY W/ RETROGRADES Bilateral 06/22/2015   Procedure: CYSTOSCOPY WITH RETROGRADE PYELOGRAM;  Surgeon: PaCleon GustinMD;  [facility];  Laterality: Bilateral;   CYSTOSCOPY W/ URETERAL STENT PLACEMENT  01/1999   CYSTOSCOPY WITH HOLMIUM LASER LITHOTRIPSY Left 05/18/2015   Procedure: CYSTOSCOPY WITH HOLMIUM LASER LITHOTRIPSY;  Surgeon: ThAlexis FrockMD;  [facility];  Laterality: Left;   CYSTOSCOPY WITH RETROGRADE PYELOGRAM, URETEROSCOPY AND STENT PLACEMENT Left 06/22/2015   Procedure: CYSTOSCOPY,  LEFT URETEROSCOPY;  Surgeon: PaCleon GustinMD;  [facility];  Laterality: Left;   CYSTOSCOPY WITH URETEROSCOPY AND STENT PLACEMENT Left 05/18/2015   Procedure: CYSTOSCOPY WITH URETEROSCOPY, STONE MANIPULATION AND STENT PLACEMENT;  Surgeon: ThAlexis FrockMD;  [facility];  Laterality: Left;   DIAGNOSTIC LAPAROSCOPY     DX LAPAROSCOPY  X2   ESOPHAGOGASTRODUODENOSCOPY  05/09/2007   EXPLORATORY LAPAROTOMY W/ BILATERAL SALPINGECTOMY AND REMOVAL RIGHT ECTOPIC PREG.  02/23/2004   EXTRACORPOREAL SHOCK WAVE LITHOTRIPSY  2001  &  2002   LAPAROSCOPIC CHOLECYSTECTOMY  1999   POLYPECTOMY     SHOULDER ARTHROSCOPY WITH OPEN ROTATOR CUFF REPAIR Right 2012   TOTAL ABDOMINAL HYSTERECTOMY W/ BILATERAL OOPHORECTOMY AND LYSIS ADHESIONS  09/02/2007  TUBAL LIGATION Bilateral 9381   UMBILICAL HERNIA REPAIR  2003   unsure if she has mesh   VAGINAL HYSTERECTOMY N/A 08/18/2015   Procedure: Exam under Anesthesia, Excision Xenform Graft, Removal Bilateral Sacrospinous Ligament sutures;   Surgeon: Nunzio Cobbs, MD;  Location: [facility];  Service: Gynecology;  Laterality: N/A;    is allergic to desvenlafaxine, prochlorperazine, and iodinated contrast media.   Family History  Problem Relation Age of Onset   Kidney disease Mother    Hypertension Mother    Colon polyps Mother    Diabetes Father    Colon polyps Father    Drug abuse Sister    Cancer Paternal Uncle        Pancreas   Pancreatic cancer Paternal Uncle    Heart attack Maternal Grandmother    Breast cancer Paternal Grandmother    Esophageal cancer Neg Hx    Stomach cancer Neg Hx    Rectal cancer Neg Hx    Colon cancer Neg Hx     Social History   Tobacco Use   Smoking status: Former    Years: 15.00    Types: Cigarettes    Quit date: 05/16/2008    Years since quitting: 14.5   Smokeless tobacco: Never  Vaping Use   Vaping Use: Never used  Substance Use Topics   Alcohol use: No    Alcohol/week: 0.0 standard drinks of alcohol   Drug use: No     Review of Systems was negative for a full 10 system review except as noted in the History of Present Illness.  No current facility-administered medications for this encounter.  Current Outpatient Medications:    acetaminophen (TYLENOL) 500 MG tablet, Take 1 tablet (500 mg total) by mouth every 6 (six) hours as needed (pain)., Disp: 30 tablet, Rfl: 0   ALPRAZolam (XANAX) 1 MG tablet, Take 1 tablet by mouth 2 (two) times daily as needed., Disp: , Rfl:    [START ON 12/30/2022] amphetamine-dextroamphetamine (ADDERALL) 10 MG tablet, Take 1 tablet (10 mg total) by mouth 2 (two) times daily., Disp: 60 tablet, Rfl: 0   diazepam (VALIUM) 5 MG tablet, Place 1 tablet vaginally nightly as needed for muscle spasm/ pelvic pain., Disp: 30 tablet, Rfl: 0   doxycycline (VIBRAMYCIN) 100 MG capsule, Take 100 mg by mouth daily., Disp: , Rfl:    estradiol (ESTRACE) 0.1 MG/GM vaginal cream, Use 1/2 g vaginally every night for the first 2 weeks, then use 1/2 g vaginally two  or three times per week as needed to maintain symptom relief., Disp: 42.5 g, Rfl: 2   FLUoxetine (PROZAC) 20 MG capsule, Take 1 capsule (20 mg total) by mouth daily., Disp: 90 capsule, Rfl: 3   ibuprofen (ADVIL) 600 MG tablet, Take 1 tablet (600 mg total) by mouth every 6 (six) hours as needed., Disp: 30 tablet, Rfl: 0   metroNIDAZOLE (METROCREAM) 0.75 % cream, Apply topically 2 (two) times daily., Disp: 45 g, Rfl: 3   Multiple Vitamin (MULTIVITAMIN WITH MINERALS) TABS tablet, Take 1 tablet by mouth daily., Disp: , Rfl:    oxyCODONE (OXY IR/ROXICODONE) 5 MG immediate release tablet, Take 1 tablet (5 mg total) by mouth every 4 (four) hours as needed for severe pain., Disp: 10 tablet, Rfl: 0   polyethylene glycol powder (GLYCOLAX/MIRALAX) 17 GM/SCOOP powder, Take 17 g by mouth daily. Drink 17g (1 scoop) dissolved in water per day., Disp: 255 g, Rfl: 0   pramipexole (MIRAPEX) 0.5 MG tablet, Take 1 tablet (  0.5 mg total) by mouth in the morning and at bedtime., Disp: 180 tablet, Rfl: 3   solifenacin (VESICARE) 5 MG tablet, Take 1 tablet (5 mg total) by mouth daily., Disp: 30 tablet, Rfl: 5   Objective There were no vitals filed for this visit.   Gen: NAD CV: S1 S2 RRR Lungs: Clear to auscultation bilaterally Abd: soft, nontender   Previous Pelvic Exam showed: POP-Q:    POP-Q   1.5                                            Aa   1.5                                           Ba   -4                                              C    5                                            Gh   4.5                                            Pb   6.5                                            tvl    -2                                            Ap   -2                                            Bp                                                  D       Assessment/ Plan  The patient is a 56 y.o. year old with stage III POP scheduled to undergo Exam under anesthesia, Robotic assisted  sacrocolpopexy, cystoscopy.    Jaquita Folds, MD

## 2022-11-28 ENCOUNTER — Encounter: Payer: Self-pay | Admitting: Family Medicine

## 2022-11-30 MED ORDER — FLUOXETINE HCL 10 MG PO CAPS: ORAL_CAPSULE | ORAL | 0 refills | Status: DC

## 2022-11-30 NOTE — Telephone Encounter (Signed)
I sent in some 10 mg Prozac with instructions on how to take it

## 2022-12-06 ENCOUNTER — Encounter (HOSPITAL_BASED_OUTPATIENT_CLINIC_OR_DEPARTMENT_OTHER): Payer: Self-pay | Admitting: Obstetrics and Gynecology

## 2022-12-06 NOTE — Progress Notes (Signed)
Spoke w/ via phone for pre-op interview--- [facility] needs dos----NONE               Lab results------ COVID test -----patient states asymptomatic no test needed Arrive at -------0630 NPO after MN NO Solid Food.   Med rec completed Medications to take morning of surgery -----Prozac, Vesicare and Meripex Diabetic medication ----- Patient instructed no nail polish to be worn day of surgery Patient instructed to bring photo id and insurance card day of surgery Patient aware to have Driver (ride ) / caregiver Husband Chesney Suares    for 24 hours after surgery  Patient Special Instructions ----- Pre-Op special Istructions ----- Patient verbalized understanding of instructions that were given at this phone interview. Patient denies shortness of breath, chest pain, fever, cough at this phone interview.

## 2022-12-10 ENCOUNTER — Ambulatory Visit (INDEPENDENT_AMBULATORY_CARE_PROVIDER_SITE_OTHER): Payer: No Typology Code available for payment source | Admitting: Psychology

## 2022-12-10 DIAGNOSIS — F411 Generalized anxiety disorder: Secondary | ICD-10-CM

## 2022-12-10 DIAGNOSIS — F330 Major depressive disorder, recurrent, mild: Secondary | ICD-10-CM

## 2022-12-10 NOTE — Progress Notes (Signed)
[place] Counselor/Therapist Progress Note  Patient ID: Meliss Fleek, MRN: [id number],    Date: 12/10/22   Time Spent: 12:00pm-12:52 pm  Treatment Type: Individual Therapy  Pt is seen for a virtual video visit via caregility.  Pt joins from her home, reporting privacy, and counselor from her home office.     Reported Symptoms: some anxiety w/ upcoming surgery, expressing emotions verbally, recognizing things not in her control.  Mental Status Exam: Appearance:  Well Groomed     Behavior: Appropriate  Motor: Normal  Speech/Language:  Normal Rate  Affect: Appropriate  Mood: normal  Thought process: normal  Thought content:   WNL  Sensory/Perceptual disturbances:   WNL  Orientation: oriented to person, place, time/date, and situation  Attention: Good  Concentration: Good  Memory: WNL  Fund of knowledge:  Good  Insight:   Good  Judgment:  Good  Impulse Control: Good   Risk Assessment: Danger to Self:  No Self-injurious Behavior: No Danger to Others: No Duty to Warn:no Physical Aggression / Violence:No  Access to Firearms a concern: No  Gang Involvement:No   Subjective: Counselor assessed pt current functioning per pt report.  Processed w/pt positives, stressors and some anxiety.  Reflected positive ways of expressing emotions and being able to acknowledged when needs to step back.  Dicussed ways of coping and setting healthy boundaries.  Pt affect wnl.  Pt reported her surgery is in 2 days and some worry w/ anesthesia and recognizing distortions and reframing for self. Discussed interactions w/ son and expressing her thoughts and feelings about want for engaging interactions.  Pt recognizing what is and isn't in control.  Pt discussed at times aware that needs to step back as can't control.    Interventions: Cognitive Behavioral Therapy, Mindfulness Meditation, and supportive  Diagnosis:Generalized anxiety disorder  Mild episode of recurrent  major depressive disorder ([place])  Plan: Pt to f/u w/ Counseling in 3 weeks. Practice mindful/grounding breathe practice and daily movement.  Reviewed crisis services if needed.  F/u as scheduled w/ PCP.  Individualized Treatment Plan Strengths: seeking counseling, enjoys getting nails done, enjoys breeding rottweiler dogs, enjoys the beach, family Sunday dinners  Supports: her husband and her mom    Goal/Needs for Treatment:  In order of importance to patient 1) cope with anxiety 2) increase self awareness      Client Statement of Needs: "Finding me again.  Learning how to control certain emotions.  Find out why something bothers me.  Where is this anxiety coming from and how to Deal with it"      Treatment Level: outpatient counseling  Symptoms:anxiety, worry, ruminating thoughts, emotional escalations, depressed mood, sleep disturbance  Client Treatment Preferences:biweekly counseling and continue medication management w/ PCP Increase to weekly counseling on 02/27/22.    Healthcare consumer's goal for treatment:   Counselor, [name], [facility] will support the patient's ability to achieve the goals identified. Cognitive Behavioral Therapy, Assertive Communication/Conflict Resolution Training, Relaxation Training, ACT, Humanistic and other evidenced-based practices will be used to promote progress towards healthy functioning.    Healthcare consumer will: Actively participate in therapy, working towards healthy functioning.     *Justification for Continuation/Discontinuation of Goal: R=Revised, O=Ongoing, A=Achieved, D=Discontinued   Goal 1) Pt will increase coping with life's anxiety and stressors AEB decreased anxiety, decreased emotional escalations, decreased sleep disturbance, increased awareness of thought patterns that impact anxiety. Baseline date 02/13/22: Progress towards goal 0; How Often - Daily Target Date Goal Was reviewed  Status Code Progress towards goal  02/13/23                             Goal 2)  Increase self awareness and identifying her likes, values, interest as demonstrated by expressing and communicating in sessions.  Baseline date 02/13/22: Progress towards goal 0; How Often - Daily   Target Date Goal Was reviewed Status Code Progress towards goal/Likert rating  02/13/23                            This plan has been reviewed and created by the following participants:  This plan will be reviewed at least every 12 months. Date Behavioral Health Clinician Date Guardian/Patient   02/13/22 [facility] [name]                      02/13/22 Verbal consent provided  02/27/22 [name] Justice [name]  02/27/22  Verbal Consent provided        [name] [facility]

## 2022-12-11 NOTE — Anesthesia Preprocedure Evaluation (Addendum)
Anesthesia Evaluation  Patient identified by MRN, date of birth, ID band Patient awake    Reviewed: Allergy & Precautions, H&P , NPO status , Patient's Chart, lab work & pertinent test results  History of Anesthesia Complications (+) PONV and history of anesthetic complications  Airway Mallampati: II  TM Distance: >3 FB Neck ROM: Full    Dental no notable dental hx. (+) Teeth Intact, Dental Advisory Given   Pulmonary neg pulmonary ROS, former smoker   Pulmonary exam normal breath sounds clear to auscultation       Cardiovascular Exercise Tolerance: Good negative cardio ROS  Rhythm:Regular Rate:Normal     Neuro/Psych  Headaches  Anxiety Depression       GI/Hepatic Neg liver ROS,GERD  ,,  Endo/Other  negative endocrine ROS    Renal/GU Renal disease  negative genitourinary   Musculoskeletal   Abdominal   Peds  Hematology negative hematology ROS (+)   Anesthesia Other Findings   Reproductive/Obstetrics negative OB ROS                             Anesthesia Physical Anesthesia Plan  ASA: 2  Anesthesia Plan: General   Post-op Pain Management: Tylenol PO (pre-op)* and Toradol IV (intra-op)*   Induction: Intravenous  PONV Risk Score and Plan: 4 or greater and Ondansetron, Dexamethasone and Midazolam  Airway Management Planned: Oral ETT  Additional Equipment:   Intra-op Plan:   Post-operative Plan: Extubation in OR  Informed Consent: I have reviewed the patients History and Physical, chart, labs and discussed the procedure including the risks, benefits and alternatives for the proposed anesthesia with the patient or authorized representative who has indicated his/her understanding and acceptance.     Dental advisory given  Plan Discussed with: CRNA  Anesthesia Plan Comments:        Anesthesia Quick Evaluation

## 2022-12-12 ENCOUNTER — Other Ambulatory Visit: Payer: Self-pay

## 2022-12-12 ENCOUNTER — Ambulatory Visit (HOSPITAL_BASED_OUTPATIENT_CLINIC_OR_DEPARTMENT_OTHER): Payer: No Typology Code available for payment source | Admitting: Anesthesiology

## 2022-12-12 ENCOUNTER — Encounter (HOSPITAL_BASED_OUTPATIENT_CLINIC_OR_DEPARTMENT_OTHER): Payer: Self-pay | Admitting: Obstetrics and Gynecology

## 2022-12-12 ENCOUNTER — Ambulatory Visit (HOSPITAL_BASED_OUTPATIENT_CLINIC_OR_DEPARTMENT_OTHER)
Admission: RE | Admit: 2022-12-12 | Discharge: 2022-12-13 | Disposition: A | Discharge status: Home / Self Care | Payer: No Typology Code available for payment source | Attending: Obstetrics and Gynecology | Admitting: Obstetrics and Gynecology

## 2022-12-12 ENCOUNTER — Encounter (HOSPITAL_BASED_OUTPATIENT_CLINIC_OR_DEPARTMENT_OTHER)
Admission: RE | Disposition: A | Discharge status: Home / Self Care | Payer: Self-pay | Source: Home / Self Care | Attending: Obstetrics and Gynecology

## 2022-12-12 DIAGNOSIS — F419 Anxiety disorder, unspecified: Secondary | ICD-10-CM | POA: Diagnosis not present

## 2022-12-12 DIAGNOSIS — F32A Depression, unspecified: Secondary | ICD-10-CM | POA: Diagnosis not present

## 2022-12-12 DIAGNOSIS — N393 Stress incontinence (female) (male): Secondary | ICD-10-CM | POA: Diagnosis not present

## 2022-12-12 DIAGNOSIS — Z87891 Personal history of nicotine dependence: Secondary | ICD-10-CM | POA: Diagnosis not present

## 2022-12-12 DIAGNOSIS — K219 Gastro-esophageal reflux disease without esophagitis: Secondary | ICD-10-CM | POA: Insufficient documentation

## 2022-12-12 DIAGNOSIS — Z90710 Acquired absence of both cervix and uterus: Secondary | ICD-10-CM | POA: Insufficient documentation

## 2022-12-12 DIAGNOSIS — N736 Female pelvic peritoneal adhesions (postinfective): Secondary | ICD-10-CM | POA: Diagnosis not present

## 2022-12-12 DIAGNOSIS — N993 Prolapse of vaginal vault after hysterectomy: Secondary | ICD-10-CM | POA: Diagnosis not present

## 2022-12-12 HISTORY — PX: CYSTOCELE REPAIR: SHX163

## 2022-12-12 HISTORY — PX: ROBOTIC ASSISTED LAPAROSCOPIC SACROCOLPOPEXY: SHX5388

## 2022-12-12 HISTORY — PX: CYSTOSCOPY: SHX5120

## 2022-12-12 SURGERY — SACROCOLPOPEXY, ROBOT-ASSISTED, LAPAROSCOPIC
Anesthesia: General | Site: Vagina

## 2022-12-12 MED ORDER — HYDROMORPHONE HCL 1 MG/ML IJ SOLN
INTRAMUSCULAR | Status: AC
  Filled 2022-12-12: qty 1

## 2022-12-12 MED ORDER — PHENAZOPYRIDINE HCL 100 MG PO TABS
ORAL_TABLET | ORAL | Status: AC
  Filled 2022-12-12: qty 2

## 2022-12-12 MED ORDER — SODIUM CHLORIDE 0.9 % IR SOLN
Status: DC | PRN
  Administered 2022-12-12 (×3): 1000 mL

## 2022-12-12 MED ORDER — EPHEDRINE 5 MG/ML INJ
INTRAVENOUS | Status: AC
  Filled 2022-12-12: qty 5

## 2022-12-12 MED ORDER — KETOROLAC TROMETHAMINE 30 MG/ML IJ SOLN
INTRAMUSCULAR | Status: AC
  Filled 2022-12-12: qty 1

## 2022-12-12 MED ORDER — OXYCODONE HCL 5 MG PO TABS
5.0000 mg | ORAL_TABLET | ORAL | Status: DC | PRN
  Administered 2022-12-12 – 2022-12-13 (×4): 10 mg via ORAL
  Administered 2022-12-13 (×2): 5 mg via ORAL

## 2022-12-12 MED ORDER — ACETAMINOPHEN 325 MG PO TABS
ORAL_TABLET | ORAL | Status: AC
  Filled 2022-12-12: qty 2

## 2022-12-12 MED ORDER — MORPHINE SULFATE (PF) 4 MG/ML IV SOLN
2.0000 mg | Freq: Once | INTRAVENOUS | Status: AC
  Administered 2022-12-12: 2 mg via INTRAVENOUS

## 2022-12-12 MED ORDER — DOCUSATE SODIUM 100 MG PO CAPS
100.0000 mg | ORAL_CAPSULE | Freq: Two times a day (BID) | ORAL | Status: DC
  Administered 2022-12-12: 100 mg via ORAL

## 2022-12-12 MED ORDER — KETOROLAC TROMETHAMINE 30 MG/ML IJ SOLN
INTRAMUSCULAR | Status: DC | PRN
  Administered 2022-12-12: 30 mg via INTRAVENOUS

## 2022-12-12 MED ORDER — PROPOFOL 10 MG/ML IV BOLUS
INTRAVENOUS | Status: DC | PRN
  Administered 2022-12-12: 130 mg via INTRAVENOUS

## 2022-12-12 MED ORDER — MIDAZOLAM HCL 2 MG/2ML IJ SOLN
INTRAMUSCULAR | Status: DC | PRN
  Administered 2022-12-12: 2 mg via INTRAVENOUS

## 2022-12-12 MED ORDER — ACETAMINOPHEN 500 MG PO TABS
ORAL_TABLET | ORAL | Status: AC
  Filled 2022-12-12: qty 2

## 2022-12-12 MED ORDER — HYDROMORPHONE HCL 1 MG/ML IJ SOLN
0.2500 mg | INTRAMUSCULAR | Status: DC | PRN
  Administered 2022-12-12 (×4): 0.5 mg via INTRAVENOUS

## 2022-12-12 MED ORDER — SIMETHICONE 80 MG PO CHEW
CHEWABLE_TABLET | ORAL | Status: AC
  Filled 2022-12-12: qty 1

## 2022-12-12 MED ORDER — DEXAMETHASONE SODIUM PHOSPHATE 10 MG/ML IJ SOLN
INTRAMUSCULAR | Status: AC
  Filled 2022-12-12: qty 1

## 2022-12-12 MED ORDER — ACETAMINOPHEN 500 MG PO TABS
1000.0000 mg | ORAL_TABLET | ORAL | Status: AC
  Administered 2022-12-12: 1000 mg via ORAL

## 2022-12-12 MED ORDER — ROCURONIUM BROMIDE 10 MG/ML (PF) SYRINGE
PREFILLED_SYRINGE | INTRAVENOUS | Status: AC
  Filled 2022-12-12: qty 10

## 2022-12-12 MED ORDER — SCOPOLAMINE 1 MG/3DAYS TD PT72
1.0000 | MEDICATED_PATCH | TRANSDERMAL | Status: DC
  Administered 2022-12-12: 1.5 mg via TRANSDERMAL

## 2022-12-12 MED ORDER — EPHEDRINE SULFATE (PRESSORS) 50 MG/ML IJ SOLN
INTRAMUSCULAR | Status: DC | PRN
  Administered 2022-12-12: 10 mg via INTRAVENOUS

## 2022-12-12 MED ORDER — PHENAZOPYRIDINE HCL 100 MG PO TABS
200.0000 mg | ORAL_TABLET | ORAL | Status: AC
  Administered 2022-12-12: 200 mg via ORAL

## 2022-12-12 MED ORDER — CEFAZOLIN SODIUM-DEXTROSE 2-4 GM/100ML-% IV SOLN
2.0000 g | INTRAVENOUS | Status: AC
  Administered 2022-12-12: 2 g via INTRAVENOUS

## 2022-12-12 MED ORDER — DEXMEDETOMIDINE HCL IN NACL 80 MCG/20ML IV SOLN
INTRAVENOUS | Status: DC | PRN
  Administered 2022-12-12 (×2): 8 ug via BUCCAL

## 2022-12-12 MED ORDER — HEMOSTATIC AGENTS (NO CHARGE) OPTIME
TOPICAL | Status: DC | PRN
  Administered 2022-12-12: 1

## 2022-12-12 MED ORDER — FENTANYL CITRATE (PF) 100 MCG/2ML IJ SOLN
INTRAMUSCULAR | Status: AC
  Filled 2022-12-12: qty 2

## 2022-12-12 MED ORDER — OXYCODONE HCL 5 MG PO TABS
ORAL_TABLET | ORAL | Status: AC
  Filled 2022-12-12: qty 2

## 2022-12-12 MED ORDER — IBUPROFEN 200 MG PO TABS
ORAL_TABLET | ORAL | Status: AC
  Filled 2022-12-12: qty 3

## 2022-12-12 MED ORDER — FENTANYL CITRATE (PF) 100 MCG/2ML IJ SOLN
INTRAMUSCULAR | Status: DC | PRN
  Administered 2022-12-12 (×2): 25 ug via INTRAVENOUS
  Administered 2022-12-12: 50 ug via INTRAVENOUS
  Administered 2022-12-12 (×2): 25 ug via INTRAVENOUS
  Administered 2022-12-12: 50 ug via INTRAVENOUS

## 2022-12-12 MED ORDER — LIDOCAINE-EPINEPHRINE 1 %-1:100000 IJ SOLN
INTRAMUSCULAR | Status: DC | PRN
  Administered 2022-12-12: 10 mL

## 2022-12-12 MED ORDER — PROPOFOL 10 MG/ML IV BOLUS
INTRAVENOUS | Status: AC
  Filled 2022-12-12: qty 20

## 2022-12-12 MED ORDER — LACTATED RINGERS IV SOLN: INTRAVENOUS | Status: DC

## 2022-12-12 MED ORDER — POVIDONE-IODINE 10 % EX SWAB: 2.0000 | Freq: Once | CUTANEOUS | Status: DC

## 2022-12-12 MED ORDER — LIDOCAINE 2% (20 MG/ML) 5 ML SYRINGE
INTRAMUSCULAR | Status: DC | PRN
  Administered 2022-12-12: 60 mg via INTRAVENOUS

## 2022-12-12 MED ORDER — IBUPROFEN 200 MG PO TABS
600.0000 mg | ORAL_TABLET | Freq: Four times a day (QID) | ORAL | Status: DC
  Administered 2022-12-12 – 2022-12-13 (×3): 600 mg via ORAL

## 2022-12-12 MED ORDER — SIMETHICONE 80 MG PO CHEW
80.0000 mg | CHEWABLE_TABLET | Freq: Four times a day (QID) | ORAL | Status: DC | PRN
  Administered 2022-12-12 – 2022-12-13 (×2): 80 mg via ORAL

## 2022-12-12 MED ORDER — MIDAZOLAM HCL 2 MG/2ML IJ SOLN
INTRAMUSCULAR | Status: AC
  Filled 2022-12-12: qty 2

## 2022-12-12 MED ORDER — GABAPENTIN 300 MG PO CAPS
ORAL_CAPSULE | ORAL | Status: AC
  Filled 2022-12-12: qty 1

## 2022-12-12 MED ORDER — ACETAMINOPHEN 500 MG PO TABS: 1000.0000 mg | ORAL_TABLET | Freq: Once | ORAL | Status: DC

## 2022-12-12 MED ORDER — MORPHINE SULFATE (PF) 4 MG/ML IV SOLN
INTRAVENOUS | Status: AC
  Filled 2022-12-12: qty 1

## 2022-12-12 MED ORDER — ACETAMINOPHEN 325 MG PO TABS
650.0000 mg | ORAL_TABLET | ORAL | Status: DC | PRN
  Administered 2022-12-12 – 2022-12-13 (×3): 650 mg via ORAL

## 2022-12-12 MED ORDER — WATER FOR IRRIGATION, STERILE IR SOLN
Status: DC | PRN
  Administered 2022-12-12: 500 mL

## 2022-12-12 MED ORDER — DEXMEDETOMIDINE HCL IN NACL 80 MCG/20ML IV SOLN
INTRAVENOUS | Status: AC
  Filled 2022-12-12: qty 20

## 2022-12-12 MED ORDER — SUGAMMADEX SODIUM 200 MG/2ML IV SOLN
INTRAVENOUS | Status: DC | PRN
  Administered 2022-12-12: 200 mg via INTRAVENOUS

## 2022-12-12 MED ORDER — DOCUSATE SODIUM 100 MG PO CAPS
ORAL_CAPSULE | ORAL | Status: AC
  Filled 2022-12-12: qty 1

## 2022-12-12 MED ORDER — ONDANSETRON HCL 4 MG/2ML IJ SOLN
INTRAMUSCULAR | Status: AC
  Filled 2022-12-12: qty 2

## 2022-12-12 MED ORDER — ONDANSETRON HCL 4 MG/2ML IJ SOLN
4.0000 mg | Freq: Four times a day (QID) | INTRAMUSCULAR | Status: DC | PRN
  Administered 2022-12-12: 4 mg via INTRAVENOUS

## 2022-12-12 MED ORDER — GABAPENTIN 300 MG PO CAPS
300.0000 mg | ORAL_CAPSULE | ORAL | Status: AC
  Administered 2022-12-12: 300 mg via ORAL

## 2022-12-12 MED ORDER — LIDOCAINE HCL (PF) 2 % IJ SOLN
INTRAMUSCULAR | Status: AC
  Filled 2022-12-12: qty 5

## 2022-12-12 MED ORDER — ONDANSETRON HCL 4 MG PO TABS: 4.0000 mg | ORAL_TABLET | Freq: Four times a day (QID) | ORAL | Status: DC | PRN

## 2022-12-12 MED ORDER — ONDANSETRON HCL 4 MG/2ML IJ SOLN
INTRAMUSCULAR | Status: DC | PRN
  Administered 2022-12-12: 4 mg via INTRAVENOUS

## 2022-12-12 MED ORDER — BUPIVACAINE HCL (PF) 0.25 % IJ SOLN
INTRAMUSCULAR | Status: DC | PRN
  Administered 2022-12-12: 18 mL

## 2022-12-12 MED ORDER — DEXAMETHASONE SODIUM PHOSPHATE 10 MG/ML IJ SOLN
INTRAMUSCULAR | Status: DC | PRN
  Administered 2022-12-12: 5 mg via INTRAVENOUS

## 2022-12-12 MED ORDER — SCOPOLAMINE 1 MG/3DAYS TD PT72
MEDICATED_PATCH | TRANSDERMAL | Status: AC
  Filled 2022-12-12: qty 1

## 2022-12-12 MED ORDER — ROCURONIUM BROMIDE 10 MG/ML (PF) SYRINGE
PREFILLED_SYRINGE | INTRAVENOUS | Status: DC | PRN
  Administered 2022-12-12: 10 mg via INTRAVENOUS
  Administered 2022-12-12: 60 mg via INTRAVENOUS
  Administered 2022-12-12 (×2): 10 mg via INTRAVENOUS

## 2022-12-12 MED ORDER — CEFAZOLIN SODIUM-DEXTROSE 2-4 GM/100ML-% IV SOLN
INTRAVENOUS | Status: AC
  Filled 2022-12-12: qty 100

## 2022-12-12 SURGICAL SUPPLY — 69 items
ADH SKN CLS APL DERMABOND .7 (GAUZE/BANDAGES/DRESSINGS) ×3
APL PRP STRL LF DISP 70% ISPRP (MISCELLANEOUS) ×6
APL SRG 38 LTWT LNG FL B (MISCELLANEOUS) ×3
APPLICATOR ARISTA FLEXITIP XL (MISCELLANEOUS) IMPLANT
BLADE SURG 15 STRL LF DISP TIS (BLADE) IMPLANT
BLADE SURG 15 STRL SS (BLADE) ×3
CATH FOLEY 3WAY  5CC 16FR (CATHETERS) ×3
CATH FOLEY 3WAY 5CC 16FR (CATHETERS) ×3 IMPLANT
CHLORAPREP W/TINT 26 (MISCELLANEOUS) ×3 IMPLANT
COVER BACK TABLE 60X90IN (DRAPES) ×3 IMPLANT
COVER TIP SHEARS 8 DVNC (MISCELLANEOUS) ×3 IMPLANT
COVER TIP SHEARS 8MM DA VINCI (MISCELLANEOUS) ×3
DEFOGGER SCOPE WARMER CLEARIFY (MISCELLANEOUS) ×3 IMPLANT
DERMABOND ADVANCED .7 DNX12 (GAUZE/BANDAGES/DRESSINGS) ×3 IMPLANT
DRAPE ARM DVNC X/XI (DISPOSABLE) ×12 IMPLANT
DRAPE COLUMN DVNC XI (DISPOSABLE) ×3 IMPLANT
DRAPE DA VINCI XI ARM (DISPOSABLE) ×12
DRAPE DA VINCI XI COLUMN (DISPOSABLE) ×3
DRAPE SHEET LG 3/4 BI-LAMINATE (DRAPES) ×3 IMPLANT
DRAPE UTILITY XL STRL (DRAPES) ×3 IMPLANT
ELECT REM PT RETURN 9FT ADLT (ELECTROSURGICAL) ×3
ELECTRODE REM PT RTRN 9FT ADLT (ELECTROSURGICAL) ×3 IMPLANT
GAUZE 4X4 16PLY ~~LOC~~+RFID DBL (SPONGE) ×6 IMPLANT
GLOVE BIO SURGEON STRL SZ 6 (GLOVE) IMPLANT
GLOVE BIO SURGEON STRL SZ 6.5 (GLOVE) IMPLANT
GLOVE BIO SURGEON STRL SZ7 (GLOVE) IMPLANT
GLOVE BIOGEL PI IND STRL 6 (GLOVE) IMPLANT
GLOVE BIOGEL PI IND STRL 6.5 (GLOVE) ×12 IMPLANT
GLOVE BIOGEL PI IND STRL 7.0 (GLOVE) ×6 IMPLANT
GLOVE ECLIPSE 6.0 STRL STRAW (GLOVE) ×9 IMPLANT
GLOVE SURG SS PI 7.5 STRL IVOR (GLOVE) IMPLANT
GOWN STRL REUS W/ TWL LRG LVL3 (GOWN DISPOSABLE) IMPLANT
GOWN STRL REUS W/TWL LRG LVL3 (GOWN DISPOSABLE) ×3
HEMOSTAT ARISTA ABSORB 3G PWDR (HEMOSTASIS) IMPLANT
HIBICLENS CHG 4% 4OZ BTL (MISCELLANEOUS) ×3 IMPLANT
HOLDER FOLEY CATH W/STRAP (MISCELLANEOUS) ×3 IMPLANT
IRRIG SUCT STRYKERFLOW 2 WTIP (MISCELLANEOUS) ×3
IRRIGATION SUCT STRKRFLW 2 WTP (MISCELLANEOUS) ×3 IMPLANT
IV NS 1000ML (IV SOLUTION) ×9
IV NS 1000ML BAXH (IV SOLUTION) IMPLANT
KIT TURNOVER CYSTO (KITS) ×3 IMPLANT
LEGGING LITHOTOMY PAIR STRL (DRAPES) ×3 IMPLANT
MANIFOLD NEPTUNE II (INSTRUMENTS) ×3 IMPLANT
MESH VERTESSA LITE -Y 2X4X3 (Mesh General) ×3 IMPLANT
NDL INSUFFLATION 14GA 120MM (NEEDLE) ×3 IMPLANT
NEEDLE INSUFFLATION 14GA 120MM (NEEDLE) ×3 IMPLANT
OBTURATOR OPTICAL STANDARD 8MM (TROCAR) ×3
OBTURATOR OPTICAL STND 8 DVNC (TROCAR) ×3
OBTURATOR OPTICALSTD 8 DVNC (TROCAR) ×3 IMPLANT
PACK CYSTO (CUSTOM PROCEDURE TRAY) ×3 IMPLANT
PACK ROBOT WH (CUSTOM PROCEDURE TRAY) ×3 IMPLANT
PACK ROBOTIC GOWN (GOWN DISPOSABLE) ×3 IMPLANT
PAD OB MATERNITY 4.3X12.25 (PERSONAL CARE ITEMS) ×3 IMPLANT
PAD POSITIONING PINK XL (MISCELLANEOUS) ×3 IMPLANT
PAD PREP 24X48 CUFFED NSTRL (MISCELLANEOUS) ×3 IMPLANT
PROTECTOR NERVE ULNAR (MISCELLANEOUS) ×3 IMPLANT
SCISSORS LAP 5X35 DISP (ENDOMECHANICALS) IMPLANT
SEAL CANN UNIV 5-8 DVNC XI (MISCELLANEOUS) ×15 IMPLANT
SEAL XI 5MM-8MM UNIVERSAL (MISCELLANEOUS) ×15
SET IRRIG Y TYPE TUR BLADDER L (SET/KITS/TRAYS/PACK) ×3 IMPLANT
SET TUBE SMOKE EVAC HIGH FLOW (TUBING) ×3 IMPLANT
SPIKE FLUID TRANSFER (MISCELLANEOUS) ×6 IMPLANT
SUT GORETEX NAB #0 THX26 36IN (SUTURE) ×3 IMPLANT
SUT MNCRL AB 4-0 PS2 18 (SUTURE) ×3 IMPLANT
SUT MON AB 2-0 SH 27 (SUTURE) ×3 IMPLANT
SUT V-LOC BARB 180 2/0GR9 GS23 (SUTURE) ×6
SUT VICRYL 2-0 SH 8X27 (SUTURE) IMPLANT
SUTURE V-LC BRB 180 2/0GR9GS23 (SUTURE) ×6 IMPLANT
TOWEL OR 17X26 10 PK STRL BLUE (TOWEL DISPOSABLE) ×3 IMPLANT

## 2022-12-12 NOTE — Anesthesia Postprocedure Evaluation (Signed)
Anesthesia Post Note  Patient: [name]  Procedure(s) Performed: XI ROBOTIC ASSISTED LAPAROSCOPIC SACROCOLPOPEXY (Abdomen) CYSTOSCOPY (Urethra) ANTERIOR REPAIR (CYSTOCELE) (Vagina )     Patient location during evaluation: PACU Anesthesia Type: General Level of consciousness: awake and alert Pain management: pain level controlled Vital Signs Assessment: post-procedure vital signs reviewed and stable Respiratory status: spontaneous breathing, nonlabored ventilation and respiratory function stable Cardiovascular status: blood pressure returned to baseline and stable Postop Assessment: no apparent nausea or vomiting Anesthetic complications: no  No notable events documented.  Last Vitals:  Vitals:   12/12/22 1320 12/12/22 1344  BP: (!) 143/75 115/63  Pulse: 85 82  Resp: 14 16  Temp: 36.4 C 36.4 C  SpO2: 94% 94%    Last Pain:  Vitals:   12/12/22 1344  TempSrc: Axillary  PainSc:                  Paityn Balsam,W. EDMOND

## 2022-12-12 NOTE — Op Note (Signed)
Operative Note  Preoperative Diagnosis: anterior vaginal prolapse, posterior vaginal prolapse, and vaginal vault prolapse after hysterectomy  Postoperative Diagnosis: same  Procedures performed:  Robotic assisted sacrocolpopexy, lysis of adhesions, anterior repair, cystoscopy  Implants:  Implant Name Type Inv. Item Serial No. Manufacturer Lot No. LRB No. Used Action  MESH Valli Glance [id number] [employer] [place] [id number] N/A 1 Implanted    Attending Surgeon: Sherlene Shams, MD  Anesthesia: General endotracheal  Findings: 1. On vaginal exam, stage II prolapse noted  2. On laparoscopy, dense adhesions surrounding the umbilicus and the left upper quadrant, adhesions of bowel to the left pelvic sidewall.   3. On cystoscopy, normal bladder and urethra without injury, lesion or foreign body. Brisk bilateral ureteral efflux noted.   Specimens: none  Estimated blood loss: 50 mL  IV fluids: 1000 mL  Urine output: 924 mL  Complications: none  Procedure in Detail:  After informed consent was obtained, the patient was taken to the operating room, where general anesthesia was induced and found to be adequate. She was placed in dorsolithotomy position in yellowfin stirrups. Her hips were noted not to be hyperflexed or hyperextended. Her arms were padded with gel pads and tucked to her sides. Her hands were surrounded by foam. A padded strap was placed across her chest with foam between the pad and her skin. She was noted to be appropriately positioned with all pressure points well padded and off tension. A tilt test showed no slippage. She was prepped and draped in the usual sterile fashion.  A sterile Foley catheter was inserted.   0.25% plain Marcaine was injected above the umbilicus an incision was made with a scalpel. A Veress needle was inserted into the incision, CO2 insufflation was started, a low opening pressure was noted, and  pneumoperitoneum was obtained. The Veress needle was removed and an 34m robotic trocar was placed with direct visualization. Entry into the peritoneal cavity was confirmed. Dense omental adhesions were noted in the abdominal midline surrounding the umbilicus, to the left and mild adhesions on the right anterior abdominal wall. Two addional robotic trocars were placed 10cm to the right and an additional 10cm laterally in a similar fashion. Adhesions were taken down with laparoscopic scissors until further ports could be placed on the left side. Lysis of adhesions took approximately 30 min. Two additional 8 mm incisions were made on the left abdomen 10 cm lateral to the midline and 10cm further lateral. All trocars were placed sequentially under direct visualization of the camera. The patient was placed in Trendelenburg. The sacrum appeared to be free of any adhesive disease.The robot was docked on the patient's right side. Monopolar endoshears were placed in the right arm, a [place] bipolar grasper was placed in the 2nd arm of the patient's left side, and a Tip up grasper was placed in the 3rd arm on the patient's left side.    With a lucite probe in the vagina, additional adhesions of omentum to the vagina were removed with monopolar scissors. Bowel was noted to be adhered to the left pelvic sidewall but this was not taken down. Anterior vaginal dissection was then performed with sharp dissection and electrosurgery to separate the vesicovaginal space. Scarring was noted further distally, preventing full dissection to the urethra. The posterior vaginal dissection was then performed with sharp dissection and electrosurgery in order to dissect the rectum away from the posterior vagina. Attention was then turned to the sacral  promontory. The peritoneum overlying the sacral promontory was tented up, dissected sharply with monopolar scissors and electrosurgery using layer by layer technique. The overlying areolar and  adipose tissue were taken down until the anterior longitudinal sacral ligament was identified. The peritoneal incision was extended down to the posterior cul-de-sac. This was performed with care to avoid the ureter on the right side and the sigmoid colon and its mesentary on the left side.   Small vessels were cauterized along the way to obtain excellent hemostasis. A "Y" mesh was then inserted into the abdomen after trimming to appropriate size. With the probe in the vagina, the anterior leaf of the Y mesh was affixed to the anterior portion of the vagina using a 2-0 v-loc suture in a spiral pattern to distribute the suture evenly across the surface of the anterior mesh leaf. In a similar fashion, the posterior leaf of the Y mesh was attached to the posterior surface of the vagina with 2-0 v-loc suture.  The distal end of the mesh was then brought to overlie the sacrum. The correct amount of tension was determined in order to elevate the vagina, but not put the mesh under tension. The distal end of the mesh was then affixed to the anterior longitudinal sacral ligament using two interrupted transverse stitches of CV2 Gortex. The excess distal mesh was then cut and removed. Arista was placed over all surgical sites. The peritoneum was reapproximated over the mesh using 2-0 monocryl. The bladder flap was incorporated to completely retroperitonealize the mesh. All pedicles were carefully inspected and noted to be hemostatic as the CO2 gas was deflated. All instruments were removed from the patient's abdomen.   The robot was undocked. The CO2 gas was removed and the ports were removed.  The skin incisions were closed with subcutaneous stitches of 4-0 Monocryl and covered with skin glue.    The Foley catheter was removed.  A 70-degree cystoscope was introduced, and 360-degree inspection revealed no injury, lesion or foreign body in the bladder. Brisk bilateral ureteral efflux was noted with the assistance of  pyridium.  The bladder was drained and the cystoscope was removed.  The Foley catheter was replaced. A small anterior distal bulge was noted in the vagina and decision was made to perform an anterior repair. After informed consent was obtained, the patient was taken to the operating room where general anesthesia was induced.  Two Allis clamps were along the anterior vaginal wall defect. 1% lidocaine with epinephrine was injected into the vaginal mucosa.  A vertical incision was made between these two Allis clamps with a 15 blade scalpel. Scarring was noted along the prior incision line.   Allis clamps were placed along this incision and Metzenbaum scissors were used to undermine the vaginal mucosa along the incision.  The vaginal mucosa was then sharply dissected off to the vesicovaginal septum bilaterally to the level of the pubic rami.  Anterior plication of the vesicovaginal septum was then performed using plicating sutures of 2-0 Vicryl. The vaginal mucosal edges were trimmed and the incision reapproximated with 2-0 Vicryl in a running fashion.    The Foley catheter was removed.  A 70-degree cystoscope was introduced, and 360-degree inspection revealed no injury, lesion or foreign body in the bladder.  Good bilateral ureteral efflux was noted.  The bladder was drained and the cystoscope was removed.  The Foley catheter was reinserted. The patient tolerated the procedure well. Sponge, lap, and needle counts were correct x 2. She was awakened  from anesthesia and transferred to the recovery room in stable condition.   Jaquita Folds, MD

## 2022-12-12 NOTE — Transfer of Care (Signed)
Immediate Anesthesia Transfer of Care Note  Patient: [name]  Procedure(s) Performed: Procedure(s) (LRB): XI ROBOTIC ASSISTED LAPAROSCOPIC SACROCOLPOPEXY (N/A) CYSTOSCOPY (N/A) ANTERIOR REPAIR (CYSTOCELE) (N/A)  Patient Location: PACU  Anesthesia Type: General  Level of Consciousness: awake, oriented, sedated and patient cooperative  Airway & Oxygen Therapy: Patient Spontanous Breathing and Patient connected to face mask oxygen  Post-op Assessment: Report given to PACU RN and Post -op Vital signs reviewed and stable  Post vital signs: Reviewed and stable  Complications: No apparent anesthesia complications  Last Vitals:  Vitals Value Taken Time  BP 142/71 12/12/22 1145  Temp    Pulse 89 12/12/22 1150  Resp 17 12/12/22 1153  SpO2 97 % 12/12/22 1150  Vitals shown include unvalidated device data.  Last Pain:  Vitals:   12/12/22 0708  TempSrc: Oral  PainSc: 0-No pain      Patients Stated Pain Goal: 4 (03/50/09 3818)  Complications: No notable events documented.

## 2022-12-12 NOTE — Anesthesia Procedure Notes (Signed)
Procedure Name: Intubation Date/Time: 12/12/2022 8:41 AM  Performed by: Suan Halter, CRNAPre-anesthesia Checklist: Patient identified, Emergency Drugs available, Suction available and Patient being monitored Patient Re-evaluated:Patient Re-evaluated prior to induction Oxygen Delivery Method: Circle system utilized Preoxygenation: Pre-oxygenation with 100% oxygen Induction Type: IV induction Ventilation: Mask ventilation without difficulty Laryngoscope Size: Mac and 3 Grade View: Grade I Tube type: Oral Tube size: 7.0 mm Number of attempts: 1 Airway Equipment and Method: Stylet and Oral airway Placement Confirmation: ETT inserted through vocal cords under direct vision, positive ETCO2 and breath sounds checked- equal and bilateral Secured at: 22 cm Tube secured with: Tape Dental Injury: Teeth and Oropharynx as per pre-operative assessment

## 2022-12-12 NOTE — Discharge Instructions (Signed)
POST OPERATIVE INSTRUCTIONS ? ?General Instructions ?Recovery (not bed rest) will last approximately 6 weeks ?Walking is encouraged, but refrain from strenuous exercise/ housework/ heavy lifting. ?No lifting >10lbs  ?Nothing in the vagina- NO intercourse, tampons or douching ?Bathing:  Do not submerge in water (NO swimming, bath, hot tub, etc) until after your postop visit. You can shower starting the day after surgery.  ?No driving until you are not taking narcotic pain medicine and until your pain is well enough controlled that you can slam on the breaks or make sudden movements if needed.  ? ?Taking your medications ?Please take your acetaminophen and ibuprofen on a schedule for the first 48 hours. Take 600mg ibuprofen, then take 500mg acetaminophen 3 hours later, then continue to alternate ibuprofen and acetaminophen. That way you are taking each type of medication every 6 hours. ?Take the prescribed narcotic (oxycodone, tramadol, etc) as needed, with a maximum being every 4 hours.  ?Take a stool softener daily to keep your stools soft and preventing you from straining. If you have diarrhea, you decrease your stool softener. This is explained more below. We have prescribed you Miralax. ? ?Reasons to Call the Nurse (see last page for phone numbers) ?Heavy Bleeding (changing your pad every 1-2 hours) ?Persistent nausea/vomiting ?Fever (100.4 degrees or more) ?Incision problems (pus or other fluid coming out, redness, warmth, increased pain) ? ?Things to Expect After Surgery ?Mild to Moderate pain is normal during the first day or two after surgery. If prescribed, take Ibuprofen or Tylenol first and use the stronger medicine for ?break-through? pain. You can overlap these medicines because they work differently.  ? ?Constipation  ? ?To Prevent Constipation:  Eat a well-balanced diet including protein, grains, fresh fruit and vegetables.  Drink plenty of fluids. Walk regularly.  Depending on specific instructions  from your physician: take Miralax daily and additionally you can add a stool softener (colace/ docusate) and fiber supplement. Continue as long as you're on pain medications.  ? ?To Treat Constipation:  If you do not have a bowel movement in 2 days after surgery, you can take 2 Tbs of Milk of Magnesia 1-2 times a day until you have a bowel movement. If diarrhea occurs, decrease the amount or stop the laxative. If no results with Milk of Magnesia, you can drink a bottle of magnesium citrate which you can purchase over the counter. ? ?Fatigue:  This is a normal response to surgery and will improve with time.  Plan frequent rest periods throughout the day. ? ?Gas Pain:  This is very common but can also be very painful! Drink warm liquids such as herbal teas, bouillon or soup. Walking will help you pass more gas.  Mylicon or Gas-X can be taken over the counter. ? ?Leaking Urine:  Varying amounts of leakage may occur after surgery.  This should improve with time. Your bladder needs at least 3 months to recover from surgery. If you leak after surgery, be sure to mention this to your doctor at your post-op visit. If you were taking medications for overactive bladder prior to surgery, be sure to restart the medications immediately after surgery. ? ?Incisions: If you have incisions on your abdomen, the skin glue will dissolve on its own over time. It is ok to gently rinse with soap and water over these incisions but do not scrub. ? ?Catheter ?Approximately 50% of patients are unable to urinate after surgery and need to go home with a catheter. This allows your bladder to   rest so it can return to full function. If you go home with a catheter, the office will call to set up a voiding trial a few days after surgery. For most patients, by this visit, they are able to urinate on their own. Long term catheter use is rare.  ? ?Return to Work ? ?As work demands and recovery times vary widely, it is hard to predict when you will want  to return to work. If you have a desk job with no strenuous physical activity, and if you would like to return sooner than generally recommended, discuss this with your provider or call our office.  ? ?Post op concerns ? ?For non-emergent issues, please call the Urogynecology Nurse. Please leave a message and someone will contact you within one business day.  You can also send a message through MyChart.  ? ?AFTER HOURS (After 5:00 PM and on weekends):  For urgent matters that cannot wait until the next business day. Call our office [phone] and connect to the doctor on call.  ?Please reserve this for important issues. ? ? ?**FOR ANY TRUE EMERGENCY ISSUES CALL 911 OR GO TO THE NEAREST EMERGENCY ROOM.** Please inform our office or the doctor on call of any emergency.   ? ? ?APPOINTMENTS: Call [phone] ? ?

## 2022-12-12 NOTE — Interval H&P Note (Signed)
History and Physical Interval Note:  12/12/2022 8:19 AM  [name]  has presented today for surgery, with the diagnosis of Anterior prolapse and prolapse of vaginal vault after hysterectomy.  The various methods of treatment have been discussed with the patient and family. After consideration of risks, benefits and other options for treatment, the patient has consented to  Procedure(s) with comments: XI ROBOTIC ASSISTED LAPAROSCOPIC SACROCOLPOPEXY (N/A)  CYSTOSCOPY (N/A) as a surgical intervention.  The patient's history has been reviewed, patient examined, no change in status, stable for surgery.  I have reviewed the patient's chart and labs.  Questions were answered to the patient's satisfaction.     Jaquita Folds

## 2022-12-13 DIAGNOSIS — N993 Prolapse of vaginal vault after hysterectomy: Secondary | ICD-10-CM | POA: Diagnosis not present

## 2022-12-13 MED ORDER — IBUPROFEN 200 MG PO TABS
ORAL_TABLET | ORAL | Status: AC
  Filled 2022-12-13: qty 3

## 2022-12-13 MED ORDER — SIMETHICONE 80 MG PO CHEW
CHEWABLE_TABLET | ORAL | Status: AC
  Filled 2022-12-13: qty 1

## 2022-12-13 MED ORDER — OXYCODONE HCL 5 MG PO TABS
ORAL_TABLET | ORAL | Status: AC
  Filled 2022-12-13: qty 1

## 2022-12-13 MED ORDER — OXYCODONE HCL 5 MG PO TABS
ORAL_TABLET | ORAL | Status: AC
  Filled 2022-12-13: qty 2

## 2022-12-13 MED ORDER — ACETAMINOPHEN 325 MG PO TABS
ORAL_TABLET | ORAL | Status: AC
  Filled 2022-12-13: qty 2

## 2022-12-13 NOTE — Discharge Summary (Signed)
Physician Discharge Summary  Patient ID: [name] MRN: [id number] DOB/AGE: 08-26-1966 56 y.o.  Admit date: 12/12/2022 Discharge date: 12/13/2022  Admission Diagnoses:  Discharge Diagnoses:  Principal Problem:   Vaginal vault prolapse after hysterectomy   Discharged Condition: good  Hospital Course: 56yo F presented for scheduled robotic assisted sacrocolpopexy, anterior repair, cystoscopy. Post-operatively, her pain was controlled with oral medication. She was ambulating, tolerating regular diet and passed her voiding trial. She was deemed stable for discharge home.   Consults: None  Significant Diagnostic Studies: none  Treatments: surgery: see above  Discharge Exam: Blood pressure 102/63, pulse (!) 58, temperature 98.3 F (36.8 C), resp. rate 14, height '5\' 5"'$  (1.651 m), weight 82 kg, last menstrual period 12/24/2006, SpO2 100 %. General appearance: alert and cooperative GI: soft, non-tender; bowel sounds normal; no masses,  no organomegaly and incisions c/d/i Extremities: extremities normal, atraumatic, no cyanosis or edema  Disposition: Discharge disposition: 01-Home or Self Care       Discharge Instructions     Call MD for:  persistant nausea and vomiting   Complete by: As directed    Call MD for:  redness, tenderness, or signs of infection (pain, swelling, redness, odor or green/yellow discharge around incision site)   Complete by: As directed    Call MD for:  severe uncontrolled pain   Complete by: As directed    Call MD for:  temperature >100.4   Complete by: As directed    Diet general   Complete by: As directed    Increase activity slowly   Complete by: As directed    May walk up steps   Complete by: As directed       Allergies as of 12/13/2022       Reactions   Desvenlafaxine Other (See Comments)   Reaction:  Withdrawal    Prochlorperazine Other (See Comments)   Altered mental status   Iodinated Contrast Media Rash, Other (See  Comments)   Previously mis-entered as Iohexol allergy. Patient is not allergic to Non-Ionic contrast currently.        Medication List     TAKE these medications    acetaminophen 500 MG tablet Commonly known as: TYLENOL Take 1 tablet (500 mg total) by mouth every 6 (six) hours as needed (pain).   ALPRAZolam 1 MG tablet Commonly known as: XANAX Take 1 tablet by mouth 2 (two) times daily as needed.   amphetamine-dextroamphetamine 10 MG tablet Commonly known as: ADDERALL Take 1 tablet (10 mg total) by mouth 2 (two) times daily. Start taking on: December 30, 2022   diazepam 5 MG tablet Commonly known as: VALIUM Place 1 tablet vaginally nightly as needed for muscle spasm/ pelvic pain.   doxycycline 100 MG capsule Commonly known as: VIBRAMYCIN Take 100 mg by mouth daily.   estradiol 0.1 MG/GM vaginal cream Commonly known as: ESTRACE Use 1/2 g vaginally every night for the first 2 weeks, then use 1/2 g vaginally two or three times per week as needed to maintain symptom relief.   FLUoxetine 10 MG capsule Commonly known as: PROZAC Take one capsule daily for 2 weeks, then take one capsule every other day for 2 weeks, then stop   ibuprofen 600 MG tablet Commonly known as: ADVIL Take 1 tablet (600 mg total) by mouth every 6 (six) hours as needed.   metroNIDAZOLE 0.75 % cream Commonly known as: METROCREAM Apply topically 2 (two) times daily.   multivitamin with minerals Tabs tablet Take 1 tablet by mouth  daily.   oxyCODONE 5 MG immediate release tablet Commonly known as: Oxy IR/ROXICODONE Take 1 tablet (5 mg total) by mouth every 4 (four) hours as needed for severe pain.   polyethylene glycol powder 17 GM/SCOOP powder Commonly known as: GLYCOLAX/MIRALAX Take 17 g by mouth daily. Drink 17g (1 scoop) dissolved in water per day.   pramipexole 0.5 MG tablet Commonly known as: Mirapex Take 1 tablet (0.5 mg total) by mouth in the morning and at bedtime.   solifenacin 5 MG  tablet Commonly known as: VESICARE Take 1 tablet (5 mg total) by mouth daily.         Signed: Jaquita Folds 12/13/2022, 1:38 PM

## 2022-12-14 ENCOUNTER — Encounter (HOSPITAL_BASED_OUTPATIENT_CLINIC_OR_DEPARTMENT_OTHER): Payer: Self-pay | Admitting: Obstetrics and Gynecology

## 2022-12-19 ENCOUNTER — Encounter: Payer: Self-pay | Admitting: Obstetrics and Gynecology

## 2022-12-19 DIAGNOSIS — G8918 Other acute postprocedural pain: Secondary | ICD-10-CM

## 2022-12-21 MED ORDER — CYCLOBENZAPRINE HCL 5 MG PO TABS: 5.0000 mg | ORAL_TABLET | Freq: Three times a day (TID) | ORAL | 1 refills | Status: DC | PRN

## 2022-12-26 ENCOUNTER — Encounter: Payer: Self-pay | Admitting: Obstetrics and Gynecology

## 2022-12-26 ENCOUNTER — Ambulatory Visit (INDEPENDENT_AMBULATORY_CARE_PROVIDER_SITE_OTHER): Payer: Self-pay | Admitting: Obstetrics and Gynecology

## 2022-12-26 VITALS — BP 136/83 | HR 79

## 2022-12-26 DIAGNOSIS — G8918 Other acute postprocedural pain: Secondary | ICD-10-CM

## 2022-12-26 DIAGNOSIS — Z9889 Other specified postprocedural states: Secondary | ICD-10-CM

## 2022-12-26 DIAGNOSIS — R350 Frequency of micturition: Secondary | ICD-10-CM

## 2022-12-26 LAB — POCT URINALYSIS DIPSTICK
Bilirubin, UA: NEGATIVE
Glucose, UA: NEGATIVE
Ketones, UA: NEGATIVE
Leukocytes, UA: NEGATIVE
Nitrite, UA: NEGATIVE
Protein, UA: NEGATIVE
Spec Grav, UA: 1.015 (ref 1.010–1.025)
Urobilinogen, UA: 0.2 E.U./dL
pH, UA: 7 (ref 5.0–8.0)

## 2022-12-26 MED ORDER — OXYCODONE HCL 5 MG PO TABS: 10.0000 mg | ORAL_TABLET | Freq: Four times a day (QID) | ORAL | 0 refills | Status: DC | PRN

## 2022-12-26 NOTE — Patient Instructions (Addendum)
You can use the vaginal valium for the muscle cramping in your pelvis region per Dr. Wannetta Sender. I have sent a prescription in for some more pain medication to assist in your post operative pain. Please try to take this for breakthrough pain and not over take the medication. You can take up to '10mg'$  every 6-8 hours, but I would prefer you limit this to breakthrough pain only. Please drink plenty of water and continue your bowel management as opioids can be constipating.

## 2022-12-26 NOTE — Progress Notes (Signed)
[facility] Post-Op  SUBJECTIVE  History of Present Illness: [name] is a 57 y.o. female seen in follow-up for Robotic assisted sacrocolpopexy, cystoscopy on 12/12/22.  BM: Patient having bowel movements daily. Using miralax and gas x as needed.   Urination: Patient reported a small amount of irritation in the urethra  Pain: Patient endorses pain in her abdomen and around the incision sites as well as pain in the left groin area.      Past Medical History: Patient  has a past medical history of Allergy, Anxiety, Depression, GERD (gastroesophageal reflux disease), History of abnormal cervical Pap smear, History of colon polyps, Hydronephrosis, left, Kidney stones (2016), PONV (postoperative nausea and vomiting), and Rosacea.   Past Surgical History: She  has a past surgical history that includes Laparoscopic cholecystectomy (1999); Tubal ligation (Bilateral, 1995); Cystoscopy w/ ureteral stent placement (01/1999); Extracorporeal shock wave lithotripsy (2001  &  2002); DX LAPAROSCOPY (X2); EXPLORATORY LAPAROTOMY W/ BILATERAL SALPINGECTOMY AND REMOVAL RIGHT ECTOPIC PREG. (02/23/2004); TOTAL ABDOMINAL HYSTERECTOMY W/ BILATERAL OOPHORECTOMY AND LYSIS ADHESIONS (09/02/2007); Esophagogastroduodenoscopy (05/09/2007); Umbilical hernia repair (2003); Shoulder arthroscopy with open rotator cuff repair (Right, 2012); Cystoscopy with ureteroscopy and stent placement (Left, 05/18/2015); Cystoscopy with holmium laser lithotripsy (Left, 05/18/2015); Cystoscopy with retrograde pyelogram, ureteroscopy and stent placement (Left, 06/22/2015); Cystoscopy w/ retrogrades (Bilateral, 06/22/2015); Abdominal hysterectomy; Diagnostic laparoscopy; Anterior and posterior repair with sacrospinous fixation (N/A, 08/16/2015); Bladder suspension (N/A, 08/16/2015); Cystoscopy (N/A, 08/16/2015); Vaginal hysterectomy (N/A, 08/18/2015); Polypectomy; Colonoscopy (08/21/2017); Robotic assisted laparoscopic  sacrocolpopexy (N/A, 12/12/2022); Cystoscopy (N/A, 12/12/2022); and Cystocele repair (N/A, 12/12/2022).   Medications: She has a current medication list which includes the following prescription(s): acetaminophen, alprazolam, [START ON 12/30/2022] amphetamine-dextroamphetamine, cyclobenzaprine, doxycycline, ibuprofen, metronidazole, multivitamin with minerals, polyethylene glycol powder, pramipexole, solifenacin, diazepam, and estradiol.   Allergies: Patient is allergic to desvenlafaxine, prochlorperazine, and iodinated contrast media.   Social History: Patient  reports that she quit smoking about 14 years ago. Her smoking use included cigarettes. She has never used smokeless tobacco. She reports that she does not drink alcohol and does not use drugs.      OBJECTIVE     Physical Exam: Vitals:   12/26/22 1528 12/26/22 1555  BP: (!) 154/84 136/83  Pulse: 80 79   Gen: No apparent distress, A&O x 3.  Detailed Urogynecologic Evaluation:  Suture line is healing well and tissues look moist and pink. No bleeding noted. Left levator is tender on exam but no obvious abscess or issues.     ASSESSMENT AND PLAN    [name] is a 57 y.o. with:  1. Post-operative pain   2. Urinary frequency    1. Discussed with Dr. Wannetta Sender. Patient sent in a prescription for oxycodone '5mg'$  which she can take 1-2 tablets every 8 hours for breakthrough pain. We discussed using heat and ice alternating. She is aware that narcotic medication can cause constipation and to use it sparingly. We discussed she can also use vaginal valium for her levator spasms if she would like but it should be significantly spaced out between the oxycodone. She reports understanding. She should also not take the muscle relaxer's while doing the vaginal valium or oxycodone.   2. Urine checked today for UTI with no signs of infection.   Plan for her to return for her post op appointment with Dr. Wannetta Sender.

## 2022-12-28 ENCOUNTER — Telehealth: Payer: Self-pay | Admitting: Obstetrics and Gynecology

## 2022-12-28 NOTE — Telephone Encounter (Signed)
Called patient to check in on how she is feeling. She reports she is feeling much better and today is the best she has felt in a while. Has only needed to take one pain pill and reports she did the vaginal valium last night which really eased her cramping.

## 2023-01-01 ENCOUNTER — Ambulatory Visit: Payer: Self-pay | Admitting: Psychology

## 2023-01-01 NOTE — Progress Notes (Signed)
                [name], LCMHC ?

## 2023-01-08 ENCOUNTER — Encounter: Payer: Self-pay | Admitting: Obstetrics and Gynecology

## 2023-01-08 DIAGNOSIS — R1084 Generalized abdominal pain: Secondary | ICD-10-CM

## 2023-01-10 ENCOUNTER — Ambulatory Visit: Admission: RE | Admit: 2023-01-10 | Payer: 59 | Source: Ambulatory Visit

## 2023-01-13 ENCOUNTER — Encounter: Payer: Self-pay | Admitting: Family Medicine

## 2023-01-14 ENCOUNTER — Ambulatory Visit
Admission: RE | Admit: 2023-01-14 | Discharge: 2023-01-14 | Disposition: A | Discharge status: Home / Self Care | Payer: 59 | Source: Ambulatory Visit | Attending: Obstetrics and Gynecology | Admitting: Obstetrics and Gynecology

## 2023-01-14 ENCOUNTER — Ambulatory Visit: Payer: Self-pay | Admitting: Psychology

## 2023-01-14 DIAGNOSIS — R1084 Generalized abdominal pain: Secondary | ICD-10-CM | POA: Insufficient documentation

## 2023-01-14 MED ORDER — IOHEXOL 300 MG/ML  SOLN
100.0000 mL | Freq: Once | INTRAMUSCULAR | Status: AC | PRN
  Administered 2023-01-14: 100 mL via INTRAVENOUS

## 2023-01-14 NOTE — Telephone Encounter (Signed)
If she can get the GLP-1 shots, that would be the way to go. If they are too expensive, she could try Phentermine

## 2023-01-15 MED ORDER — OZEMPIC (0.25 OR 0.5 MG/DOSE) 2 MG/3ML ~~LOC~~ SOPN: 0.2500 mg | PEN_INJECTOR | SUBCUTANEOUS | 2 refills | Status: DC

## 2023-01-15 NOTE — Telephone Encounter (Signed)
I sent the RX for Ozempic to [facility]. Please do the PA after that

## 2023-01-16 ENCOUNTER — Other Ambulatory Visit: Payer: Self-pay | Admitting: Family Medicine

## 2023-01-17 ENCOUNTER — Ambulatory Visit (INDEPENDENT_AMBULATORY_CARE_PROVIDER_SITE_OTHER): Payer: 59 | Admitting: Psychology

## 2023-01-17 DIAGNOSIS — F330 Major depressive disorder, recurrent, mild: Secondary | ICD-10-CM

## 2023-01-17 DIAGNOSIS — F411 Generalized anxiety disorder: Secondary | ICD-10-CM

## 2023-01-17 NOTE — Progress Notes (Signed)
[place] Counselor/Therapist Progress Note  Patient ID: [name], MRN: [id number],    Date: 01/17/23   Time Spent: 10:00am-10:38am  Treatment Type: Individual Therapy  Pt is seen for a virtual video visit via caregility.  Pt joins from her home, reporting privacy, and counselor from her home office.     Reported Symptoms: some worry w/ increasing weight, recognizing negative self talk and reframing  Mental Status Exam: Appearance:  Well Groomed     Behavior: Appropriate  Motor: Normal  Speech/Language:  Normal Rate  Affect: Appropriate  Mood: sad  Thought process: normal  Thought content:   WNL  Sensory/Perceptual disturbances:   WNL  Orientation: oriented to person, place, time/date, and situation  Attention: Good  Concentration: Good  Memory: WNL  Fund of knowledge:  Good  Insight:   Good  Judgment:  Good  Impulse Control: Good   Risk Assessment: Danger to Self:  No Self-injurious Behavior: No Danger to Others: No Duty to Warn:no Physical Aggression / Violence:No  Access to Firearms a concern: No  Gang Involvement:No   Subjective: Counselor assessed pt current functioning per pt report.  Processed w/pt positives, stressors and moods.  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  Discussed w/pt struggles w/ negative self talk w/ increased weight.  Assisted pt w/ reframing and healthy choices towards her goals.  Pt affect wnl.  Pt reported she has returned to work after the new year.  Pt reported that she is recovering. Pt did have pain and did f/u w/ surgeon.  Pt reports increased awareness of her expectations for recovery.  Pt is bothered by increase weight and recognized some impact from surgery, limiting her physical activity and changes in her diet.  Pt discussed negative self talk and reframed.  Pt informed she had weaned off her Prozac before surgery.  Pt had noticed some sadness and tearfulness and  recognized also around her sisters birthday and 63 years since death.  Pt discussed positives she has been able to cope through.  Pt husband's business is taking off and plans to be helping w/. 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    Interventions: Cognitive Behavioral Therapy, Mindfulness Meditation, and supportive  Diagnosis:Generalized anxiety disorder  Mild episode of recurrent major depressive disorder ([place])  Plan: Pt to f/u w/ Counseling in 3-4 weeks. Practice mindful/grounding breathe practice and daily movement.  Reviewed crisis services if needed.  F/u as scheduled w/ PCP.  Individualized Treatment Plan Strengths: seeking counseling, enjoys getting nails done, enjoys breeding rottweiler dogs, enjoys the beach, family Sunday dinners  Supports: her husband and her mom    Goal/Needs for Treatment:  In order of importance to patient 1) cope with anxiety 2) increase self awareness      Client Statement of Needs: "Finding me again.  Learning how to control certain emotions.  Find out why something bothers me.  Where is this anxiety coming from and how to Deal with it"      Treatment Level: outpatient counseling  Symptoms:anxiety, worry, ruminating thoughts, emotional escalations, depressed mood, sleep disturbance  Client Treatment Preferences:biweekly counseling and continue medication management w/ PCP Increase to weekly counseling on 02/27/22.    Healthcare consumer's goal for treatment:   Counselor, [name], [facility] will support the patient's ability to achieve the goals identified. Cognitive Behavioral Therapy, Assertive Communication/Conflict Resolution Training, Relaxation Training, ACT, Humanistic and other evidenced-based practices will  be used to promote progress towards healthy functioning.    Healthcare consumer will: Actively participate in therapy, working towards healthy functioning.      *Justification for Continuation/Discontinuation of Goal: R=Revised, O=Ongoing, A=Achieved, D=Discontinued   Goal 1) Pt will increase coping with life's anxiety and stressors AEB decreased anxiety, decreased emotional escalations, decreased sleep disturbance, increased awareness of thought patterns that impact anxiety. Baseline date 02/13/22: Progress towards goal 0; How Often - Daily Target Date Goal Was reviewed Status Code Progress towards goal  02/13/23                            Goal 2)  Increase self awareness and identifying her likes, values, interest as demonstrated by expressing and communicating in sessions.  Baseline date 02/13/22: Progress towards goal 0; How Often - Daily   Target Date Goal Was reviewed Status Code Progress towards goal/Likert rating  02/13/23                            This plan has been reviewed and created by the following participants:  This plan will be reviewed at least every 12 months. Date Behavioral Health Clinician Date Guardian/Patient   02/13/22 [facility] [name] [name]                      02/13/22 Verbal consent provided  02/27/22 [name] [name] [name] [name]  02/27/22  Verbal Consent provided         [name] [facility]

## 2023-01-21 ENCOUNTER — Encounter: Payer: Self-pay | Admitting: Family Medicine

## 2023-01-21 ENCOUNTER — Other Ambulatory Visit (INDEPENDENT_AMBULATORY_CARE_PROVIDER_SITE_OTHER): Payer: 59

## 2023-01-21 ENCOUNTER — Telehealth (INDEPENDENT_AMBULATORY_CARE_PROVIDER_SITE_OTHER): Payer: 59 | Admitting: Family Medicine

## 2023-01-21 DIAGNOSIS — G2581 Restless legs syndrome: Secondary | ICD-10-CM | POA: Diagnosis not present

## 2023-01-21 DIAGNOSIS — R7303 Prediabetes: Secondary | ICD-10-CM | POA: Insufficient documentation

## 2023-01-21 LAB — HEMOGLOBIN A1C: Hgb A1c MFr Bld: 5.6 % (ref 4.6–6.5)

## 2023-01-21 MED ORDER — METFORMIN HCL 500 MG PO TABS: 500.0000 mg | ORAL_TABLET | Freq: Two times a day (BID) | ORAL | 5 refills | Status: DC

## 2023-01-21 MED ORDER — PRAMIPEXOLE DIHYDROCHLORIDE 1 MG PO TABS: 1.0000 mg | ORAL_TABLET | Freq: Two times a day (BID) | ORAL | 5 refills | Status: DC

## 2023-01-21 MED ORDER — DOXYCYCLINE HYCLATE 100 MG PO CAPS: 100.0000 mg | ORAL_CAPSULE | Freq: Every day | ORAL | 3 refills | Status: DC

## 2023-01-21 NOTE — Progress Notes (Signed)
Subjective:    Patient ID: [name], female    DOB: [birth year], 57 y.o.   MRN: [id number]  HPI Virtual Visit via Video Note  I connected with the patient on 01/21/23 at  8:30 AM EST by a video enabled telemedicine application and verified that I am speaking with the correct person using two identifiers.  Location patient: home Location provider:work or home office Persons participating in the virtual visit: patient, provider  I discussed the limitations of evaluation and management by telemedicine and the availability of in person appointments. The patient expressed understanding and agreed to proceed.   HPI: Here for several issues. First she wants to discuss weight loss. We had tried to start her on Ozempic, but her insurance denied coverage for this. She asks about a cost effective alternative. She is dieting and exercising. Also she asks for an increase on the pramipexole because her restless legs are still giving her problems.    ROS: See pertinent positives and negatives per HPI.  Past Medical History:  Diagnosis Date   Allergy    Anxiety    Depression    GERD (gastroesophageal reflux disease)    History of abnormal cervical Pap smear    1991 -- 1995--hx cryotherapy to cervix   History of colon polyps    2008- BENIGN   Hydronephrosis, left    Kidney stones 2016   PONV (postoperative nausea and vomiting)    Rosacea     Past Surgical History:  Procedure Laterality Date   ABDOMINAL HYSTERECTOMY     ANTERIOR AND POSTERIOR REPAIR WITH SACROSPINOUS FIXATION N/A 08/16/2015   Procedure: ANTERIOR COLPORRHAPHY WITH XENOFORM GRAFT AND SACROSPINOUS FIXATION;  Surgeon: Nunzio Cobbs, MD;  Location: [facility];  Service: Gynecology;  Laterality: N/A;  2.5 hours OR time   BLADDER SUSPENSION N/A 08/16/2015   Procedure: TRANSVAGINAL TAPE (TVT) PROCEDURE exact midurethral sling;  Surgeon: Nunzio Cobbs, MD;  Location: [facility];  Service: Gynecology;   Laterality: N/A;   COLONOSCOPY  08/21/2017   per Dr. Ardis Hughs, clear, repeat in 10 yrs   CYSTOCELE REPAIR N/A 12/12/2022   Procedure: ANTERIOR REPAIR (CYSTOCELE);  Surgeon: Jaquita Folds, MD;  Location: [facility];  Service: Gynecology;  Laterality: N/A;   CYSTOSCOPY N/A 08/16/2015   Procedure: CYSTOSCOPY;  Surgeon: Nunzio Cobbs, MD;  Location: [facility];  Service: Gynecology;  Laterality: N/A;   CYSTOSCOPY N/A 12/12/2022   Procedure: CYSTOSCOPY;  Surgeon: Jaquita Folds, MD;  Location: [facility];  Service: Gynecology;  Laterality: N/A;   CYSTOSCOPY W/ RETROGRADES Bilateral 06/22/2015   Procedure: CYSTOSCOPY WITH RETROGRADE PYELOGRAM;  Surgeon: Cleon Gustin, MD;  Location: [facility];  Service: Urology;  Laterality: Bilateral;   CYSTOSCOPY W/ URETERAL STENT PLACEMENT  01/1999   CYSTOSCOPY WITH HOLMIUM LASER LITHOTRIPSY Left 05/18/2015   Procedure: CYSTOSCOPY WITH HOLMIUM LASER LITHOTRIPSY;  Surgeon: Alexis Frock, MD;  Location: [facility];  Service: Urology;  Laterality: Left;   CYSTOSCOPY WITH RETROGRADE PYELOGRAM, URETEROSCOPY AND STENT PLACEMENT Left 06/22/2015   Procedure: CYSTOSCOPY,  LEFT URETEROSCOPY;  Surgeon: Cleon Gustin, MD;  Location: [facility];  Service: Urology;  Laterality: Left;   CYSTOSCOPY WITH URETEROSCOPY AND STENT PLACEMENT Left 05/18/2015   Procedure: CYSTOSCOPY WITH URETEROSCOPY, STONE MANIPULATION AND STENT PLACEMENT;  Surgeon: Alexis Frock, MD;  Location: [facility];  Service: Urology;  Laterality: Left;   DIAGNOSTIC LAPAROSCOPY  DX LAPAROSCOPY  X2   ESOPHAGOGASTRODUODENOSCOPY  05/09/2007   EXPLORATORY LAPAROTOMY W/ BILATERAL SALPINGECTOMY AND REMOVAL RIGHT ECTOPIC PREG.  02/23/2004   EXTRACORPOREAL SHOCK WAVE LITHOTRIPSY  2001  &  2002   LAPAROSCOPIC CHOLECYSTECTOMY  1999   POLYPECTOMY     ROBOTIC ASSISTED LAPAROSCOPIC  SACROCOLPOPEXY N/A 12/12/2022   Procedure: XI ROBOTIC ASSISTED LAPAROSCOPIC SACROCOLPOPEXY;  Surgeon: Jaquita Folds, MD;  Location: [facility];  Service: Gynecology;  Laterality: N/A;  Total time requested 3 hours -Assist requested   SHOULDER ARTHROSCOPY WITH OPEN ROTATOR CUFF REPAIR Right 2012   TOTAL ABDOMINAL HYSTERECTOMY W/ BILATERAL OOPHORECTOMY AND LYSIS ADHESIONS  09/02/2007   TUBAL LIGATION Bilateral 1017   UMBILICAL HERNIA REPAIR  2003   unsure if she has mesh   VAGINAL HYSTERECTOMY N/A 08/18/2015   Procedure: Exam under Anesthesia, Excision Xenform Graft, Removal Bilateral Sacrospinous Ligament sutures;  Surgeon: Nunzio Cobbs, MD;  Location: [facility];  Service: Gynecology;  Laterality: N/A;    Family History  Problem Relation Age of Onset   Kidney disease Mother    Hypertension Mother    Colon polyps Mother    Diabetes Father    Colon polyps Father    Drug abuse Sister    Cancer Paternal Uncle        Pancreas   Pancreatic cancer Paternal Uncle    Heart attack Maternal Grandmother    Breast cancer Paternal Grandmother    Esophageal cancer Neg Hx    Stomach cancer Neg Hx    Rectal cancer Neg Hx    Colon cancer Neg Hx      Current Outpatient Medications:    ALPRAZolam (XANAX) 1 MG tablet, Take 1 tablet by mouth 2 (two) times daily as needed., Disp: , Rfl:    diazepam (VALIUM) 5 MG tablet, Place 1 tablet vaginally nightly as needed for muscle spasm/ pelvic pain., Disp: 30 tablet, Rfl: 0   estradiol (ESTRACE) 0.1 MG/GM vaginal cream, Use 1/2 g vaginally every night for the first 2 weeks, then use 1/2 g vaginally two or three times per week as needed to maintain symptom relief., Disp: 42.5 g, Rfl: 2   metFORMIN (GLUCOPHAGE) 500 MG tablet, Take 1 tablet (500 mg total) by mouth 2 (two) times daily with a meal., Disp: 60 tablet, Rfl: 5   metroNIDAZOLE (METROCREAM) 0.75 % cream, Apply topically 2 (two) times daily., Disp: 45 g, Rfl: 3   Multiple  Vitamin (MULTIVITAMIN WITH MINERALS) TABS tablet, Take 1 tablet by mouth daily., Disp: , Rfl:    pramipexole (MIRAPEX) 1 MG tablet, Take 1 tablet (1 mg total) by mouth in the morning and at bedtime., Disp: 60 tablet, Rfl: 5   solifenacin (VESICARE) 5 MG tablet, Take 1 tablet (5 mg total) by mouth daily., Disp: 30 tablet, Rfl: 5   amphetamine-dextroamphetamine (ADDERALL) 10 MG tablet, Take 1 tablet (10 mg total) by mouth 2 (two) times daily. (Patient not taking: Reported on 01/21/2023), Disp: 60 tablet, Rfl: 0   doxycycline (VIBRAMYCIN) 100 MG capsule, Take 1 capsule (100 mg total) by mouth daily., Disp: 90 capsule, Rfl: 3  EXAM:  VITALS per patient if applicable:  GENERAL: alert, oriented, appears well and in no acute distress  HEENT: atraumatic, conjunttiva clear, no obvious abnormalities on inspection of external nose and ears  NECK: normal movements of the head and neck  LUNGS: on inspection no signs of respiratory distress, breathing rate appears normal, no obvious gross SOB, gasping or wheezing  CV: no obvious cyanosis  MS: moves all visible extremities without noticeable abnormality  PSYCH/NEURO: pleasant and cooperative, no obvious depression or anxiety, speech and thought processing grossly intact  ASSESSMENT AND PLAN: For weight loss, we noted that she is prediabetic (her A1c last July was 5.7). she will come in for another A1c, and she will start on Metformin 500 mg BID. We will repeat an A1c when we follow up with her in 90 days. For the restless legs, we will increase the Pramipexole to 1 mg BID. Alysia Penna, MD  Discussed the following assessment and plan:  Prediabetes - Plan: Hemoglobin A1c     I discussed the assessment and treatment plan with the patient. The patient was provided an opportunity to ask questions and all were answered. The patient agreed with the plan and demonstrated an understanding of the instructions.   The patient was advised to call back or seek  an in-person evaluation if the symptoms worsen or if the condition fails to improve as anticipated.      Review of Systems     Objective:   Physical Exam        Assessment & Plan:

## 2023-01-22 ENCOUNTER — Other Ambulatory Visit (HOSPITAL_COMMUNITY): Payer: Self-pay

## 2023-01-22 NOTE — Telephone Encounter (Signed)
Per separate encounter, looks like PA has already been denied. Step therapy is needed, looks like pt will be starting metformin. Is PA still needed?

## 2023-01-23 ENCOUNTER — Encounter: Payer: No Typology Code available for payment source | Admitting: Obstetrics and Gynecology

## 2023-01-23 ENCOUNTER — Encounter: Payer: Self-pay | Admitting: Obstetrics and Gynecology

## 2023-01-23 ENCOUNTER — Ambulatory Visit (INDEPENDENT_AMBULATORY_CARE_PROVIDER_SITE_OTHER): Payer: 59 | Admitting: Obstetrics and Gynecology

## 2023-01-23 VITALS — BP 138/76 | HR 92

## 2023-01-23 DIAGNOSIS — M62838 Other muscle spasm: Secondary | ICD-10-CM

## 2023-01-23 DIAGNOSIS — R3915 Urgency of urination: Secondary | ICD-10-CM | POA: Diagnosis not present

## 2023-01-23 DIAGNOSIS — Z9889 Other specified postprocedural states: Secondary | ICD-10-CM

## 2023-01-23 DIAGNOSIS — R82998 Other abnormal findings in urine: Secondary | ICD-10-CM

## 2023-01-23 LAB — POCT URINALYSIS DIPSTICK
Bilirubin, UA: NEGATIVE
Glucose, UA: NEGATIVE
Ketones, UA: NEGATIVE
Leukocytes, UA: NEGATIVE
Nitrite, UA: NEGATIVE
Protein, UA: NEGATIVE
Spec Grav, UA: 1.015 (ref 1.010–1.025)
Urobilinogen, UA: 0.2 E.U./dL
pH, UA: 7 (ref 5.0–8.0)

## 2023-01-23 MED ORDER — SULFAMETHOXAZOLE-TRIMETHOPRIM 800-160 MG PO TABS: 1.0000 | ORAL_TABLET | Freq: Two times a day (BID) | ORAL | 0 refills | Status: AC

## 2023-01-23 MED ORDER — CYCLOBENZAPRINE HCL 5 MG PO TABS: 5.0000 mg | ORAL_TABLET | Freq: Three times a day (TID) | ORAL | 2 refills | Status: DC | PRN

## 2023-01-23 NOTE — Telephone Encounter (Signed)
We have started her on Metformin, so we do not need a PA now  

## 2023-01-23 NOTE — Progress Notes (Signed)
[facility]  Date of Visit: 01/23/2023  History of Present Illness: [name] is a 57 y.o. female scheduled today for a post-operative visit.   Surgery: s/p Robotic assisted sacrocolpopexy, lysis of adhesions, anterior repair, cystoscopy on 12/13/23  She passed her postoperative void trial.   Postoperative course has been uncomplicated by prolonged abdominal pain. She underwent CT A/P on 01/14/23 which showed:  IMPRESSION: 1. No postoperative fluid collection. 2. Mild left hydronephrosis since prior. No obstructive process is seen, including at the left UVJ. 3. Mild periumbilical fat stranding which may be related to operative access or an underlying fatty periumbilical hernia..   Today she reports she has some cramping and pelvic pain on the left side. Pain comes and goes. She is taking tylenol and ibuprofen as needed. The valium did help with some of the cramping. Sometimes has discomfort with urination. Has some discharge with an odor and an odor with urination.   UTI in the last 6 weeks? No  Pain?  See above She has returned to her normal activity (except for postop restrictions) Vaginal bulge? No  Stress incontinence: No  Urgency/frequency: No  Urge incontinence: No  Stopped the vesicare and she is not having any strong urges.  Voiding dysfunction: No  Bowel issues: No , but still has pain with defecation. Taking miralax daily now (did stop for a while). She bought a Physiological scientist.   Subjective Success: Do you usually have a bulge or something falling out that you can see or feel in the vaginal area? No  Retreatment Success: Any retreatment with surgery or pessary for any compartment? No    Medications: She has a current medication list which includes the following prescription(s): cyclobenzaprine, sulfamethoxazole-trimethoprim, alprazolam, amphetamine-dextroamphetamine, diazepam, doxycycline, estradiol, metformin, metronidazole, multivitamin with minerals,  pramipexole, and solifenacin.   Allergies: Patient is allergic to desvenlafaxine, prochlorperazine, and iodinated contrast media.   Physical Exam: BP 138/76   Pulse 92   LMP 12/24/2006 (Approximate)   Abdomen: soft, non-tender, without masses or organomegaly Laparoscopic Incisions: healing well.  Pelvic Examination: Vagina: Incisions healing well. Sutures are present at the anterior incision line and there is not granulation tissue.  No visible or palpable mesh. Increased tension and tenderness along the pelvic floor muscles, especially on the right side- reproduces pain.   POP-Q: POP-Q  -2                                            Aa   -2                                           Ba  -7                                              C   3                                            Gh  3  Pb  7                                            tvl   -3                                            Ap  -3                                            Bp                                                 D    PVR obtained by bladder scan- 29m POC urine: small blood, moderate leukocytes ---------------------------------------------------------  Assessment and Plan:  1. Post-operative state   2. Urinary urgency   3. Leukocytes in urine   4. Levator spasm     - Overall healing well.  - Can resume regular activity including exercise and intercourse,  if desired.  - Discussed avoidance of heavy lifting and straining long term to reduce the risk of recurrence.  - Appears to have a urinary tract infection today which may be contributing to her cramping and pain. Prescribed bactrim DS BID x 3 days and urine culture sent to confirm.  - For pelvic floor muscle spasm, prescribed flexeril '5mg'$ - can take three times a day as needed. Also recommended pelvic physical therapy and referral placed.   Return 2  months for follow up  MJaquita Folds  MD

## 2023-01-27 LAB — URINE CULTURE

## 2023-02-04 ENCOUNTER — Encounter: Payer: Self-pay | Admitting: Family Medicine

## 2023-02-04 NOTE — Telephone Encounter (Signed)
We have started her on Metformin, so we do not need a PA now

## 2023-02-05 NOTE — Telephone Encounter (Signed)
The Metformin may be dropping her glucose too low. Tell to start cutting these in half, and she can take 1/2 tablet (or 250 mg) twice a day

## 2023-02-11 ENCOUNTER — Ambulatory Visit (INDEPENDENT_AMBULATORY_CARE_PROVIDER_SITE_OTHER): Payer: 59 | Admitting: Psychology

## 2023-02-11 ENCOUNTER — Encounter: Payer: Self-pay | Admitting: *Deleted

## 2023-02-11 DIAGNOSIS — F411 Generalized anxiety disorder: Secondary | ICD-10-CM

## 2023-02-11 DIAGNOSIS — F330 Major depressive disorder, recurrent, mild: Secondary | ICD-10-CM | POA: Diagnosis not present

## 2023-02-11 NOTE — Progress Notes (Signed)
[place] Counselor/Therapist Progress Note  Patient ID: [name], MRN: [id number],    Date: 02/11/23   Time Spent: 12:00pm-12:51pm  Treatment Type: Individual Therapy  Pt is seen for a virtual video visit via caregility.  Pt joins from her home, reporting privacy, and counselor from her home office.     Reported Symptoms: some feelings of sadness, grieving change in relationship w/ son  Mental Status Exam: Appearance:  Well Groomed     Behavior: Appropriate  Motor: Normal  Speech/Language:  Normal Rate  Affect: Appropriate  Mood: sad  Thought process: normal  Thought content:   WNL  Sensory/Perceptual disturbances:   WNL  Orientation: oriented to person, place, time/date, and situation  Attention: Good  Concentration: Good  Memory: WNL  Fund of knowledge:  Good  Insight:   Good  Judgment:  Good  Impulse Control: Good   Risk Assessment: Danger to Self:  No Self-injurious Behavior: No Danger to Others: No Duty to Warn:no Physical Aggression / Violence:No  Access to Firearms a concern: No  Gang Involvement:No   Subjective: Counselor assessed pt current functioning per pt report.  Processed w/pt feelings of sadness and change in relationship w/ son.  Explored w/pt feelings and validating emotions and exploring for any distortions and reframing.  Discussed use of coping skills and ways of expressing feelings to not avoid.   Pt affect wnl.  Pt reported she has felt some increased sadness and feels that grieving loss/change in relationship w/ son.  Pt reported that she is aware of sadness and ways she is expressing.  Pt reported on ways of coping through and using her self care.  Pt does reports sometimes thoughts of what she has done wrong or negative self talk w/ her increase weight.  Pt is able to reframe w/ assistance and focus on her positive steps for self.   Pt shared how feels that still progressing w/ managing through emotions.    Interventions: Cognitive Behavioral Therapy, Mindfulness Meditation, and supportive  Diagnosis:Generalized anxiety disorder  Mild episode of recurrent major depressive disorder (Deal)  Plan: Pt to f/u w/ Counseling in 3-4 weeks. Practice mindful/grounding breathe practice and daily movement.  Reviewed crisis services if needed.  F/u as scheduled w/ PCP.  Individualized Treatment Plan Strengths: seeking counseling, enjoys getting nails done, enjoys breeding rottweiler dogs, enjoys the beach, family Sunday dinners  Supports: her husband and her mom    Goal/Needs for Treatment:  In order of importance to patient 1) cope with anxiety 2) increase self awareness      Client Statement of Needs: "Finding me again.  Learning how to control certain emotions.  Find out why something bothers me.  Where is this anxiety coming from and how to Deal with it"      Treatment Level: outpatient counseling  Symptoms:anxiety, worry, ruminating thoughts, emotional escalations, depressed mood, sleep disturbance  Client Treatment Preferences:biweekly counseling and continue medication management w/ PCP Increase to weekly counseling on 02/27/22.    Healthcare consumer's goal for treatment:   Counselor, [name], [facility] will support the patient's ability to achieve the goals identified. Cognitive Behavioral Therapy, Assertive Communication/Conflict Resolution Training, Relaxation Training, ACT, Humanistic and other evidenced-based practices will be used to promote progress towards healthy functioning.    Healthcare consumer will: Actively participate in therapy, working towards healthy functioning.     *Justification for Continuation/Discontinuation of Goal: R=Revised, O=Ongoing, A=Achieved, D=Discontinued   Goal 1) Pt will increase coping with life's  anxiety and stressors AEB decreased anxiety, decreased emotional escalations, decreased sleep disturbance, increased awareness of thought patterns that  impact anxiety. Baseline date 02/13/22: Progress towards goal 0; How Often - Daily Target Date Goal Was reviewed Status Code Progress towards goal  02/13/23                            Goal 2)  Increase self awareness and identifying her likes, values, interest as demonstrated by expressing and communicating in sessions.  Baseline date 02/13/22: Progress towards goal 0; How Often - Daily   Target Date Goal Was reviewed Status Code Progress towards goal/Likert rating  02/13/23                            This plan has been reviewed and created by the following participants:  This plan will be reviewed at least every 12 months. Date Behavioral Health Clinician Date Guardian/Patient   02/13/22 [name]                      02/13/22 Verbal consent provided  02/27/22 [name] Blodgett [name]  02/27/22  Verbal Consent provided             [name] [facility]

## 2023-02-14 ENCOUNTER — Encounter: Payer: Self-pay | Admitting: Family Medicine

## 2023-02-18 MED ORDER — PHENTERMINE HCL 37.5 MG PO CAPS: 37.5000 mg | ORAL_CAPSULE | ORAL | 5 refills | Status: DC

## 2023-02-18 NOTE — Telephone Encounter (Signed)
Okay stop the Metformin and I will send in a RX for phentermine. If both of these do not work out, then we will try another PA for the shots

## 2023-02-20 NOTE — Telephone Encounter (Signed)
Yes follow up in one month

## 2023-03-01 ENCOUNTER — Encounter: Payer: Self-pay | Admitting: Family Medicine

## 2023-03-01 ENCOUNTER — Telehealth (INDEPENDENT_AMBULATORY_CARE_PROVIDER_SITE_OTHER): Payer: 59 | Admitting: Family Medicine

## 2023-03-01 DIAGNOSIS — E669 Obesity, unspecified: Secondary | ICD-10-CM

## 2023-03-01 MED ORDER — PHENTERMINE HCL 30 MG PO CAPS: 30.0000 mg | ORAL_CAPSULE | ORAL | 5 refills | Status: DC

## 2023-03-01 NOTE — Progress Notes (Signed)
Subjective:    Patient ID: [name], female    DOB: March 21, 1966, 57 y.o.   MRN: [id number]  HPI Virtual Visit via Video Note  I connected with the patient on 03/01/23 at  8:30 AM EST by a video enabled telemedicine application and verified that I am speaking with the correct person using two identifiers.  Location patient: home Location provider:work or home office Persons participating in the virtual visit: patient, provider  I discussed the limitations of evaluation and management by telemedicine and the availability of in person appointments. The patient expressed understanding and agreed to proceed.   HPI: Here to discuss Phentermine. She has been taking the 37.5 mg dose and she says it really helps curb her cravings for food. However it makes her a little nervous and jittery. She has not been taking Adderall for some time.    ROS: See pertinent positives and negatives per HPI.  Past Medical History:  Diagnosis Date   Allergy    Anxiety    Depression    GERD (gastroesophageal reflux disease)    History of abnormal cervical Pap smear    1991 -- 1995--hx cryotherapy to cervix   History of colon polyps    2008- BENIGN   Hydronephrosis, left    Kidney stones 2016   PONV (postoperative nausea and vomiting)    Rosacea     Past Surgical History:  Procedure Laterality Date   ABDOMINAL HYSTERECTOMY     ANTERIOR AND POSTERIOR REPAIR WITH SACROSPINOUS FIXATION N/A 08/16/2015   Procedure: ANTERIOR COLPORRHAPHY WITH XENOFORM GRAFT AND SACROSPINOUS FIXATION;  Surgeon: Nunzio Cobbs, MD;  Location: [facility];  Service: Gynecology;  Laterality: N/A;  2.5 hours OR time   BLADDER SUSPENSION N/A 08/16/2015   Procedure: TRANSVAGINAL TAPE (TVT) PROCEDURE exact midurethral sling;  Surgeon: Nunzio Cobbs, MD;  Location: [facility];  Service: Gynecology;  Laterality: N/A;   COLONOSCOPY  08/21/2017   per Dr. Ardis Hughs, clear, repeat in 10 yrs   CYSTOCELE REPAIR N/A  12/12/2022   Procedure: ANTERIOR REPAIR (CYSTOCELE);  Surgeon: Jaquita Folds, MD;  Location: [facility];  Service: Gynecology;  Laterality: N/A;   CYSTOSCOPY N/A 08/16/2015   Procedure: CYSTOSCOPY;  Surgeon: Nunzio Cobbs, MD;  Location: [facility];  Service: Gynecology;  Laterality: N/A;   CYSTOSCOPY N/A 12/12/2022   Procedure: CYSTOSCOPY;  Surgeon: Jaquita Folds, MD;  [facility];  Service: Gynecology;  Laterality: N/A;   CYSTOSCOPY W/ RETROGRADES Bilateral 06/22/2015   Procedure: CYSTOSCOPY WITH RETROGRADE PYELOGRAM;  Surgeon: Cleon Gustin, MD;  Location: [facility];  Service: Urology;  Laterality: Bilateral;   CYSTOSCOPY W/ URETERAL STENT PLACEMENT  01/1999   CYSTOSCOPY WITH HOLMIUM LASER LITHOTRIPSY Left 05/18/2015   Procedure: CYSTOSCOPY WITH HOLMIUM LASER LITHOTRIPSY;  Surgeon: Alexis Frock, MD;  Location: [facility];  Service: Urology;  Laterality: Left;   CYSTOSCOPY WITH RETROGRADE PYELOGRAM, URETEROSCOPY AND STENT PLACEMENT Left 06/22/2015   Procedure: CYSTOSCOPY,  LEFT URETEROSCOPY;  Surgeon: Cleon Gustin, MD;  Location: [facility];  Service: Urology;  Laterality: Left;   CYSTOSCOPY WITH URETEROSCOPY AND STENT PLACEMENT Left 05/18/2015   Procedure: CYSTOSCOPY WITH URETEROSCOPY, STONE MANIPULATION AND STENT PLACEMENT;  Surgeon: Alexis Frock, MD;  Location: [facility];  Service: Urology;  Laterality: Left;   DIAGNOSTIC LAPAROSCOPY     DX LAPAROSCOPY  X2   ESOPHAGOGASTRODUODENOSCOPY  05/09/2007   EXPLORATORY  LAPAROTOMY W/ BILATERAL SALPINGECTOMY AND REMOVAL RIGHT ECTOPIC PREG.  02/23/2004   EXTRACORPOREAL SHOCK WAVE LITHOTRIPSY  2001  &  2002   LAPAROSCOPIC CHOLECYSTECTOMY  1999   POLYPECTOMY     ROBOTIC ASSISTED LAPAROSCOPIC SACROCOLPOPEXY N/A 12/12/2022   Procedure: XI ROBOTIC ASSISTED LAPAROSCOPIC SACROCOLPOPEXY;  Surgeon: Jaquita Folds, MD;  Location[facility];  Service: Gynecology;  Laterality: N/A;  Total time requested 3 hours -Assist requested   SHOULDER ARTHROSCOPY WITH OPEN ROTATOR CUFF REPAIR Right 2012   TOTAL ABDOMINAL HYSTERECTOMY W/ BILATERAL OOPHORECTOMY AND LYSIS ADHESIONS  09/02/2007   TUBAL LIGATION Bilateral Q000111Q   UMBILICAL HERNIA REPAIR  2003   unsure if she has mesh   VAGINAL HYSTERECTOMY N/A 08/18/2015   Procedure: Exam under Anesthesia, Excision Xenform Graft, Removal Bilateral Sacrospinous Ligament sutures;  Surgeon: Nunzio Cobbs, MD;  Location: [facility];  Service: Gynecology;  Laterality: N/A;    Family History  Problem Relation Age of Onset   Kidney disease Mother    Hypertension Mother    Colon polyps Mother    Diabetes Father    Colon polyps Father    Drug abuse Sister    Cancer Paternal Uncle        Pancreas   Pancreatic cancer Paternal Uncle    Heart attack Maternal Grandmother    Breast cancer Paternal Grandmother    Esophageal cancer Neg Hx    Stomach cancer Neg Hx    Rectal cancer Neg Hx    Colon cancer Neg Hx      Current Outpatient Medications:    ALPRAZolam (XANAX) 1 MG tablet, Take 1 tablet by mouth 2 (two) times daily as needed., Disp: , Rfl:    doxycycline (VIBRAMYCIN) 100 MG capsule, Take 1 capsule (100 mg total) by mouth daily., Disp: 90 capsule, Rfl: 3   estradiol (ESTRACE) 0.1 MG/GM vaginal cream, Use 1/2 g vaginally every night for the first 2 weeks, then use 1/2 g vaginally two or three times per week as needed to maintain symptom relief., Disp: 42.5 g, Rfl: 2   metroNIDAZOLE (METROCREAM) 0.75 % cream, Apply topically 2 (two) times daily., Disp: 45 g, Rfl: 3   Multiple Vitamin (MULTIVITAMIN WITH MINERALS) TABS tablet, Take 1 tablet by mouth daily., Disp: , Rfl:    pramipexole (MIRAPEX) 1 MG tablet, Take 1 tablet (1 mg total) by mouth in the morning and at bedtime., Disp: 60 tablet, Rfl: 5   amphetamine-dextroamphetamine (ADDERALL)  10 MG tablet, Take 1 tablet (10 mg total) by mouth 2 (two) times daily. (Patient not taking: Reported on 01/21/2023), Disp: 60 tablet, Rfl: 0   phentermine 37.5 MG capsule, Take 1 capsule (37.5 mg total) by mouth every morning. (Patient not taking: Reported on 03/01/2023), Disp: 30 capsule, Rfl: 5  EXAM:  VITALS per patient if applicable:  GENERAL: alert, oriented, appears well and in no acute distress  HEENT: atraumatic, conjunttiva clear, no obvious abnormalities on inspection of external nose and ears  NECK: normal movements of the head and neck  LUNGS: on inspection no signs of respiratory distress, breathing rate appears normal, no obvious gross SOB, gasping or wheezing  CV: no obvious cyanosis  MS: moves all visible extremities without noticeable abnormality  PSYCH/NEURO: pleasant and cooperative, no obvious depression or anxiety, speech and thought processing grossly intact  ASSESSMENT AND PLAN: Obesity. We will decrease the dose pf Phentermine to 30 mg every morning. Report back in one month. Alysia Penna, MD  Discussed the  following assessment and plan:  No diagnosis found.     I discussed the assessment and treatment plan with the patient. The patient was provided an opportunity to ask questions and all were answered. The patient agreed with the plan and demonstrated an understanding of the instructions.   The patient was advised to call back or seek an in-person evaluation if the symptoms worsen or if the condition fails to improve as anticipated.      Review of Systems     Objective:   Physical Exam        Assessment & Plan:

## 2023-03-04 ENCOUNTER — Ambulatory Visit (INDEPENDENT_AMBULATORY_CARE_PROVIDER_SITE_OTHER): Payer: 59 | Admitting: Psychology

## 2023-03-04 DIAGNOSIS — F330 Major depressive disorder, recurrent, mild: Secondary | ICD-10-CM | POA: Diagnosis not present

## 2023-03-04 DIAGNOSIS — F411 Generalized anxiety disorder: Secondary | ICD-10-CM | POA: Diagnosis not present

## 2023-03-04 NOTE — Progress Notes (Signed)
[facility] Counselor Annual Adult Exam  Name: [name] Date: 03/04/2023 MRN: [id number] DOB: [birth year] PCP: Laurey Morale, MD  Time spent: 12:00pm-12:48pm  Pt presents for a virtual video visit via caregility.  Pt joins from her home, reporting privacy and counselor from her home office.   Guardian/Payee:  self    Paperwork requested:  n/a  Reason for Visit /Presenting Problem:  Pt presents for annual evaluation of continued counseling for GAD and MDD.  Pt was referred by Dr. Sarajane Jews for counseling due to stress and started w/ this counseling 01/2022.  Pt has struggled w/ anxiety for years previous.  Pt anxiety began escalated and impacting marriage.  Pt over past week has followed up consistently w/ appointments starting weekly then transitioning to biweekly or less frequent.  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  Pt also reports stress of changes in relationship w/ her youngest son and feeling he is less engaged w/ family.  Pt also struggled w/ feelings of jealousy w/ husband interactions w/ online support groups in past year.  Pt has been able to better cope through these and decreased escalations between her and husband.  Pt expresses worry for her mom and her health.    Pt also recognizes that she is very empathetic and will end of taking on other's pain and also has a helper will try to fix things for other when can't. Pt also wanting to "figure out me and who I am after being a mom, grandmother, wife and worker."  Mental Status Exam: Appearance:   Well Groomed     Behavior:  Appropriate  Motor:  Normal  Speech/Language:   Normal Rate  Affect:  Appropriate  Mood:  anxious and sad  Thought process:  normal  Thought content:    WNL  Sensory/Perceptual disturbances:    WNL  Orientation:  oriented to person, place, time/date, and situation  Attention:  Good   Concentration:  Good  Memory:  WNL  Fund of knowledge:   Good  Insight:    Good  Judgment:   Good  Impulse Control:  Good    Reported Symptoms:  Pt reports decreased anxiety.  Pt reports recently feeling overwhelmed w/ work/helping husband.  Pt reports increased ability to deescalate when upset/anxious/angry.  Pt reports some sadness w/ feeling loss of relationship w/ youngest son.  Pt reports struggles w/ self esteem and feeling good about self.     Risk Assessment: Danger to Self:  No Self-injurious Behavior: No Danger to Others: No Duty to Warn:no Physical Aggression / Violence:No  Access to Firearms a concern: No  Gang Involvement:No  Patient / guardian was educated about steps to take if suicide or homicide risk level increases between visits: n/a While future psychiatric events cannot be accurately predicted, the patient does not currently require acute inpatient psychiatric care and does not currently meet [facility] involuntary commitment criteria.  Substance Abuse History: Current substance abuse: No     Past Psychiatric History:   Previous psychological history is significant for anxiety Outpatient Providers:no hx of outpatient counseling.  History of Psych Hospitalization: No  Psychological Testing:  none    Abuse History:  Victim of: No.,  none    Report needed: No. Victim of Neglect:No. Perpetrator of  none   Witness / Exposure to Domestic Violence: No   Protective Services Involvement: No  Witness to [employer] Violence:  No  Family History:  Family History  Problem Relation Age of Onset   Kidney disease Mother    Hypertension Mother    Colon polyps Mother    Diabetes Father    Colon polyps Father    Drug abuse Sister    Cancer Paternal Uncle        Pancreas   Pancreatic cancer Paternal Uncle    Heart attack Maternal Grandmother    Breast cancer Paternal Grandmother    Esophageal cancer Neg Hx    Stomach cancer Neg Hx    Rectal cancer Neg Hx     Colon cancer Neg Hx   Grew up in [place] younger brother, had a sister 2004 drug overdose.  58- high school years parents divorced.  Senior year mom moved to [place] w/ relationship.  [place] and had kids.  Sister kept in contact w/ things going on.  Brother very to himself.  Mom lives in  [place] w/ her husband.   Mom is only one on that side  left.  Brother has anxiety.  Niece has mental health problems.  SA for sister.    Living situation: the patient lives with her spouse.  They currently have 2 dogs that have breeded in past.  Pt recently has transitioned other dogs and puppies to new homes and stepping back from breeding until decides if wants to continue.    In 2020 her 2 son's and their families moved in with them to save money and stressful/busy household for several months they lived together.    Sexual Orientation: Straight  Relationship Status: married since 71 Name of spouse / other:Adrian.  Husband on disability.   If a parent, number of children / ages: 2 boys age 28y/o and 75y/o and 4 grandchildren ages 78 to 9y/o and grandbaby due this year.    Support Systems: spouse, daughter in law  and mom  Financial Stress:  No   Income/Employment/Disability: Employment.  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  Works from [employer]	[place] work hours- but flexible. At this company since 57 y/o.   Fredrich Birks is awesome.  Pt had been breeding dogs for 8 years and currently taken a pause as overwhelmed w/.  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    Military Service: No   Educational History: Education: some college 1 year business school  degree  Religion/Sprituality/World View: Not reported  Any cultural differences that may affect / interfere with treatment:  not applicable   Recreation/Hobbies: getting nails done. Relaxing in bath.  Walks.  Going to beach.  Try to get together for Sunday family dinners.  Stressors: Other:  overwhelmed w/ multiple commitments, aging parents,  changes w/ son who is more distant since engaged.     Strengths: Supportive Relationships, seeking tx  Barriers:  none reported   Legal History: Pending legal issue / charges: The patient has no significant history of legal issues. History of legal issue / charges:  none reported  Medical History/Surgical History: reviewed Past Medical History:  Diagnosis Date   Allergy    Anxiety    Depression    GERD (gastroesophageal reflux disease)    History of abnormal cervical Pap smear    1991 -- 1995--hx cryotherapy to cervix   History of colon polyps    2008- BENIGN   Hydronephrosis, left    Kidney stones 2016   PONV (postoperative nausea and vomiting)    Rosacea     Past  Surgical History:  Procedure Laterality Date   ABDOMINAL HYSTERECTOMY     ANTERIOR AND POSTERIOR REPAIR WITH SACROSPINOUS FIXATION N/A 08/16/2015   Procedure: ANTERIOR COLPORRHAPHY WITH XENOFORM GRAFT AND SACROSPINOUS FIXATION;  Surgeon: Nunzio Cobbs, MD;  Location: [facility];  Service: Gynecology;  Laterality: N/A;  2.5 hours OR time   BLADDER SUSPENSION N/A 08/16/2015   Procedure: TRANSVAGINAL TAPE (TVT) PROCEDURE exact midurethral sling;  Surgeon: Nunzio Cobbs, MD;  Location: [facility];  Service: Gynecology;  Laterality: N/A;   COLONOSCOPY  08/21/2017   per Dr. Ardis Hughs, clear, repeat in 10 yrs   CYSTOCELE REPAIR N/A 12/12/2022   Procedure: ANTERIOR REPAIR (CYSTOCELE);  Surgeon: Jaquita Folds, MD;  Location: [facility];  Service: Gynecology;  Laterality: N/A;   CYSTOSCOPY N/A 08/16/2015   Procedure: CYSTOSCOPY;  Surgeon: Nunzio Cobbs, MD;  Location: [facility];  Service: Gynecology;  Laterality: N/A;   CYSTOSCOPY N/A 12/12/2022   Procedure: CYSTOSCOPY;  Surgeon: Jaquita Folds, MD;  Location: [facility];  Service: Gynecology;  Laterality: N/A;   CYSTOSCOPY W/ RETROGRADES Bilateral  06/22/2015   Procedure: CYSTOSCOPY WITH RETROGRADE PYELOGRAM;  Surgeon: Cleon Gustin, MD;  Location[facility];  Service: Urology;  Laterality: Bilateral;   CYSTOSCOPY W/ URETERAL STENT PLACEMENT  01/1999   CYSTOSCOPY WITH HOLMIUM LASER LITHOTRIPSY Left 05/18/2015   Procedure: CYSTOSCOPY WITH HOLMIUM LASER LITHOTRIPSY;  Surgeon: Alexis Frock, MD;  Location: [facility];  Service: Urology;  Laterality: Left;   CYSTOSCOPY WITH RETROGRADE PYELOGRAM, URETEROSCOPY AND STENT PLACEMENT Left 06/22/2015   Procedure: CYSTOSCOPY,  LEFT URETEROSCOPY;  Surgeon: Cleon Gustin, MD;  Location: [facility];  Service: Urology;  Laterality: Left;   CYSTOSCOPY WITH URETEROSCOPY AND STENT PLACEMENT Left 05/18/2015   Procedure: CYSTOSCOPY WITH URETEROSCOPY, STONE MANIPULATION AND STENT PLACEMENT;  Surgeon: Alexis Frock, MD;  Location: [facility];  Service: Urology;  Laterality: Left;   DIAGNOSTIC LAPAROSCOPY     DX LAPAROSCOPY  X2   ESOPHAGOGASTRODUODENOSCOPY  05/09/2007   EXPLORATORY LAPAROTOMY W/ BILATERAL SALPINGECTOMY AND REMOVAL RIGHT ECTOPIC PREG.  02/23/2004   EXTRACORPOREAL SHOCK WAVE LITHOTRIPSY  2001  &  2002   LAPAROSCOPIC CHOLECYSTECTOMY  1999   POLYPECTOMY     ROBOTIC ASSISTED LAPAROSCOPIC SACROCOLPOPEXY N/A 12/12/2022   Procedure: XI ROBOTIC ASSISTED LAPAROSCOPIC SACROCOLPOPEXY;  Surgeon: Jaquita Folds, MD;  Location: [facility];  Service: Gynecology;  Laterality: N/A;  Total time requested 3 hours -Assist requested   SHOULDER ARTHROSCOPY WITH OPEN ROTATOR CUFF REPAIR Right 2012   TOTAL ABDOMINAL HYSTERECTOMY W/ BILATERAL OOPHORECTOMY AND LYSIS ADHESIONS  09/02/2007   TUBAL LIGATION Bilateral Q000111Q   UMBILICAL HERNIA REPAIR  2003   unsure if she has mesh   VAGINAL HYSTERECTOMY N/A 08/18/2015   Procedure: Exam under Anesthesia, Excision Xenform Graft, Removal Bilateral Sacrospinous Ligament sutures;   Surgeon: Nunzio Cobbs, MD;  Location: [facility];  Service: Gynecology;  Laterality: N/A;    Medications: Current Outpatient Medications  Medication Sig Dispense Refill   ALPRAZolam (XANAX) 1 MG tablet Take 1 tablet by mouth 2 (two) times daily as needed.     doxycycline (VIBRAMYCIN) 100 MG capsule Take 1 capsule (100 mg total) by mouth daily. 90 capsule 3   estradiol (ESTRACE) 0.1 MG/GM vaginal cream Use 1/2 g vaginally every night for the first 2 weeks, then use 1/2 g vaginally two or three times per  week as needed to maintain symptom relief. 42.5 g 2   metroNIDAZOLE (METROCREAM) 0.75 % cream Apply topically 2 (two) times daily. 45 g 3   Multiple Vitamin (MULTIVITAMIN WITH MINERALS) TABS tablet Take 1 tablet by mouth daily.     phentermine 30 MG capsule Take 1 capsule (30 mg total) by mouth every morning. 30 capsule 5   pramipexole (MIRAPEX) 1 MG tablet Take 1 tablet (1 mg total) by mouth in the morning and at bedtime. 60 tablet 5   No current facility-administered medications for this visit.    Allergies  Allergen Reactions   Desvenlafaxine Other (See Comments)    Reaction:  Withdrawal    Prochlorperazine Other (See Comments)    Altered mental status   Iodinated Contrast Media Rash and Other (See Comments)    Previously mis-entered as Iohexol allergy. Patient is not allergic to Non-Ionic contrast currently.    Diagnoses:  Generalized anxiety disorder  Mild episode of recurrent major depressive disorder ([place])  Plan of Care: pt is a 57y/o married female seeking continued counseling for anxiety and depression.  Pt has been seeking this counselor for the past year starting w/ weekly counseling and transitioning to less frequent.  Pt had hx of anxiety tx w/ medications for several years.  Pt reports improved coping skills through counseling and reduced anxious escalations.  Pt reports struggles w/ self esteem.  Pt reports some depressive symptoms that are mild in nature.  Pt to  f/u w/ counseling in 3-4 weeks.  Practice mindful/grounding breathe practice and daily movement. F/u as scheduled w/ PCP.   Individualized Treatment Plan Strengths: seeking counseling, enjoys getting nails done, enjoys walking and movement, enjoys the beach, family Sunday dinners  Supports: her husband, her daughter in law and her mom    Goal/Needs for Treatment:  In order of importance to patient 1) continue to cope with anxiety 2) increase self worth      Client Statement of Needs: "focus on my self and continue building my self esteem and feel better about myself and confidence. Continue to maintain progress w/ coping w/ anxiety, being able to use mindfulness and deescalate.       Treatment Level: outpatient counseling  Symptoms:anxiety, worry, ruminating thoughts, low self worth, emotional escalations, depressed mood, sleep disturbance  Client Treatment Preferences:Monthly counseling and continue medication management w/ PCP     Healthcare consumer's goal for treatment:   Counselor, [name], [facility] will support the patient's ability to achieve the goals identified. Cognitive Behavioral Therapy, Assertive Communication/Conflict Resolution Training, Relaxation Training, ACT, Humanistic and other evidenced-based practices will be used to promote progress towards healthy functioning.    Healthcare consumer will: Actively participate in therapy, working towards healthy functioning.     *Justification for Continuation/Discontinuation of Goal: R=Revised, O=Ongoing, A=Achieved, D=Discontinued   Goal 1) Pt will increase coping with life's anxiety and stressors AEB decreased anxiety, decreased emotional escalations, decreased sleep disturbance, increased awareness of thought patterns that impact anxiety. Baseline date 03/04/23: Progress towards goal 50%; How Often - Daily Target Date Goal Was reviewed Status Code Progress towards goal  03/03/24                            Goal 2)   Increase self awareness, identifying her likes/values/interest and increase feeling positive self worth w/ who she is as demonstrated by expressing and communicating in sessions.  Baseline date 03/04/23: Progress towards goal 25; How Often - Daily  Target Date Goal Was reviewed Status Code Progress towards goal/Likert rating  03/03/24                            This plan has been reviewed and created by the following participants:  This plan will be reviewed at least every 12 months. Date Behavioral Health Clinician Date Guardian/Patient   03/04/23 [name]                      03/04/23 Verbal consent provided                 [name] [facility]

## 2023-03-07 ENCOUNTER — Ambulatory Visit: Payer: 59 | Admitting: Physical Therapy

## 2023-03-21 ENCOUNTER — Encounter: Payer: Self-pay | Admitting: Family Medicine

## 2023-03-21 NOTE — Telephone Encounter (Signed)
Please start a PA for her Ozempic

## 2023-03-22 ENCOUNTER — Ambulatory Visit: Payer: 59 | Admitting: Obstetrics and Gynecology

## 2023-03-24 NOTE — Progress Notes (Deleted)
[facility] Return Visit  SUBJECTIVE  History of Present Illness: [name] is a 57 y.o. female seen in follow-up for pelvic floor pain. Plan at last visit was start flexeril 5mg  up to 3 times daily for muscle spasms.     Past Medical History: Patient  has a past medical history of Allergy, Anxiety, Depression, GERD (gastroesophageal reflux disease), History of abnormal cervical Pap smear, History of colon polyps, Hydronephrosis, left, Kidney stones (2016), PONV (postoperative nausea and vomiting), and Rosacea.   Past Surgical History: She  has a past surgical history that includes Laparoscopic cholecystectomy (1999); Tubal ligation (Bilateral, 1995); Cystoscopy w/ ureteral stent placement (01/1999); Extracorporeal shock wave lithotripsy (2001  &  2002); DX LAPAROSCOPY (X2); EXPLORATORY LAPAROTOMY W/ BILATERAL SALPINGECTOMY AND REMOVAL RIGHT ECTOPIC PREG. (02/23/2004); TOTAL ABDOMINAL HYSTERECTOMY W/ BILATERAL OOPHORECTOMY AND LYSIS ADHESIONS (09/02/2007); Esophagogastroduodenoscopy (05/09/2007); Umbilical hernia repair (2003); Shoulder arthroscopy with open rotator cuff repair (Right, 2012); Cystoscopy with ureteroscopy and stent placement (Left, 05/18/2015); Cystoscopy with holmium laser lithotripsy (Left, 05/18/2015); Cystoscopy with retrograde pyelogram, ureteroscopy and stent placement (Left, 06/22/2015); Cystoscopy w/ retrogrades (Bilateral, 06/22/2015); Abdominal hysterectomy; Diagnostic laparoscopy; Anterior and posterior repair with sacrospinous fixation (N/A, 08/16/2015); Bladder suspension (N/A, 08/16/2015); Cystoscopy (N/A, 08/16/2015); Vaginal hysterectomy (N/A, 08/18/2015); Polypectomy; Colonoscopy (08/21/2017); Robotic assisted laparoscopic sacrocolpopexy (N/A, 12/12/2022); Cystoscopy (N/A, 12/12/2022); and Cystocele repair (N/A, 12/12/2022).   Medications: She has a current medication list which includes the following prescription(s): alprazolam, doxycycline,  estradiol, metronidazole, multivitamin with minerals, phentermine, and pramipexole.   Allergies: Patient is allergic to desvenlafaxine, prochlorperazine, and iodinated contrast media.   Social History: Patient  reports that she quit smoking about 14 years ago. Her smoking use included cigarettes. She has never used smokeless tobacco. She reports that she does not drink alcohol and does not use drugs.      OBJECTIVE     Physical Exam: There were no vitals filed for this visit. Gen: No apparent distress, A&O x 3.  Detailed Urogynecologic Evaluation:  Deferred. Prior exam showed:      No data to display             ASSESSMENT AND PLAN    Ms. Gogue is a 56 y.o. with:  No diagnosis found.

## 2023-03-25 ENCOUNTER — Ambulatory Visit: Payer: 59 | Admitting: Obstetrics and Gynecology

## 2023-03-25 MED ORDER — OZEMPIC (0.25 OR 0.5 MG/DOSE) 2 MG/3ML ~~LOC~~ SOPN: 0.2500 mg | PEN_INJECTOR | SUBCUTANEOUS | 0 refills | Status: DC

## 2023-03-25 NOTE — Telephone Encounter (Signed)
I sent a RX for Ozempic to [facility], so we will wait to hear from them

## 2023-03-25 NOTE — Telephone Encounter (Signed)
Message sent to PCP as the Rx for Ozempic has to be sent in prior to PA being completed.

## 2023-04-01 ENCOUNTER — Ambulatory Visit: Payer: 59 | Admitting: Psychology

## 2023-04-03 ENCOUNTER — Telehealth: Payer: Self-pay | Admitting: Family Medicine

## 2023-04-03 ENCOUNTER — Other Ambulatory Visit (HOSPITAL_COMMUNITY): Payer: Self-pay

## 2023-04-03 NOTE — Telephone Encounter (Signed)
Pt call and stated her Insurance sent a PA over and stated when sending back they need to ICD -10 code on it.

## 2023-04-03 NOTE — Telephone Encounter (Signed)
I understand. She should be trying Metformin now

## 2023-04-03 NOTE — Telephone Encounter (Signed)
Need to know which medication please.

## 2023-04-05 ENCOUNTER — Other Ambulatory Visit: Payer: Self-pay

## 2023-04-08 ENCOUNTER — Other Ambulatory Visit: Payer: Self-pay

## 2023-04-08 MED ORDER — METFORMIN HCL 500 MG PO TABS: 500.0000 mg | ORAL_TABLET | Freq: Two times a day (BID) | ORAL | 5 refills | Status: DC

## 2023-04-08 NOTE — Telephone Encounter (Signed)
Yes she can take Phentermine and Metformin together. Call in Metformin 500 mg BID, #60 with 5 rf

## 2023-04-10 ENCOUNTER — Ambulatory Visit: Payer: 59 | Admitting: Obstetrics and Gynecology

## 2023-04-30 NOTE — Telephone Encounter (Signed)
We will try try to send the PA again

## 2023-05-02 NOTE — Telephone Encounter (Signed)
Where do we stand with the PA now?

## 2023-05-03 ENCOUNTER — Other Ambulatory Visit: Payer: Self-pay

## 2023-05-03 NOTE — Telephone Encounter (Signed)
Adlen Alpert (Key: [id number]) - [id number] Ozempic (0.25 or 0.5 MG/DOSE) 2MG Ronny Bacon pen-injectors Status: PA RequestCreated: May 10th, 2024

## 2023-05-06 ENCOUNTER — Telehealth (INDEPENDENT_AMBULATORY_CARE_PROVIDER_SITE_OTHER): Payer: 59 | Admitting: Family Medicine

## 2023-05-06 ENCOUNTER — Encounter: Payer: Self-pay | Admitting: Family Medicine

## 2023-05-06 DIAGNOSIS — E669 Obesity, unspecified: Secondary | ICD-10-CM

## 2023-05-06 MED ORDER — TOPIRAMATE 50 MG PO TABS: 50.0000 mg | ORAL_TABLET | Freq: Every day | ORAL | 2 refills | Status: DC

## 2023-05-06 NOTE — Progress Notes (Signed)
Subjective:    Patient ID: [name], female    DOB: 1966-04-03, 57 y.o.   MRN: [id number]  HPI Virtual Visit via Video Note  I connected with the patient on 05/06/23 at  9:30 AM EDT by a video enabled telemedicine application and verified that I am speaking with the correct person using two identifiers.  Location patient: home Location provider:work or home office Persons participating in the virtual visit: patient, provider  I discussed the limitations of evaluation and management by telemedicine and the availability of in person appointments. The patient expressed understanding and agreed to proceed.   HPI: Here to follow up on her attempts to lose weight. Her insurance will not cover any of the GLP-1 agents, so she is taking Phentermine 30 mg daily. This has not been effective for her and she asks for advice.    ROS: See pertinent positives and negatives per HPI.  Past Medical History:  Diagnosis Date   Allergy    Anxiety    Depression    GERD (gastroesophageal reflux disease)    History of abnormal cervical Pap smear    1991 -- 1995--hx cryotherapy to cervix   History of colon polyps    2008- BENIGN   Hydronephrosis, left    Kidney stones 2016   PONV (postoperative nausea and vomiting)    Rosacea     Past Surgical History:  Procedure Laterality Date   ABDOMINAL HYSTERECTOMY     ANTERIOR AND POSTERIOR REPAIR WITH SACROSPINOUS FIXATION N/A 08/16/2015   Procedure: ANTERIOR COLPORRHAPHY WITH XENOFORM GRAFT AND SACROSPINOUS FIXATION;  Surgeon: Patton Salles, MD;  Location: [facility];  Service: Gynecology;  Laterality: N/A;  2.5 hours OR time   BLADDER SUSPENSION N/A 08/16/2015   Procedure: TRANSVAGINAL TAPE (TVT) PROCEDURE exact midurethral sling;  Surgeon: Patton Salles, MD;  Location: [facility];  Service: Gynecology;  Laterality: N/A;   COLONOSCOPY  08/21/2017   per Dr. Christella Hartigan, clear, repeat in 10 yrs   CYSTOCELE REPAIR N/A 12/12/2022    Procedure: ANTERIOR REPAIR (CYSTOCELE);  Surgeon: Marguerita Beards, MD;  Location: [facility];  Service: Gynecology;  Laterality: N/A;   CYSTOSCOPY N/A 08/16/2015   Procedure: CYSTOSCOPY;  Surgeon: Patton Salles, MD;  Location: [facility];  Service: Gynecology;  Laterality: N/A;   CYSTOSCOPY N/A 12/12/2022   Procedure: CYSTOSCOPY;  Surgeon: Marguerita Beards, MD;  Location: [facility];  Service: Gynecology;  Laterality: N/A;   CYSTOSCOPY W/ RETROGRADES Bilateral 06/22/2015   Procedure: CYSTOSCOPY WITH RETROGRADE PYELOGRAM;  Surgeon: Malen Gauze, MD;  Location: [facility];  Service: Urology;  Laterality: Bilateral;   CYSTOSCOPY W/ URETERAL STENT PLACEMENT  01/1999   CYSTOSCOPY WITH HOLMIUM LASER LITHOTRIPSY Left 05/18/2015   Procedure: CYSTOSCOPY WITH HOLMIUM LASER LITHOTRIPSY;  Surgeon: Sebastian Ache, MD;  Location[facility];  Service: Urology;  Laterality: Left;   CYSTOSCOPY WITH RETROGRADE PYELOGRAM, URETEROSCOPY AND STENT PLACEMENT Left 06/22/2015   Procedure: CYSTOSCOPY,  LEFT URETEROSCOPY;  Surgeon: Malen Gauze, MD;  Location: [facility];  Service: Urology;  Laterality: Left;   CYSTOSCOPY WITH URETEROSCOPY AND STENT PLACEMENT Left 05/18/2015   Procedure: CYSTOSCOPY WITH URETEROSCOPY, STONE MANIPULATION AND STENT PLACEMENT;  Surgeon: Sebastian Ache, MD;  Location: [facility];  Service: Urology;  Laterality: Left;   DIAGNOSTIC LAPAROSCOPY     DX LAPAROSCOPY  X2   ESOPHAGOGASTRODUODENOSCOPY  05/09/2007   EXPLORATORY LAPAROTOMY  W/ BILATERAL SALPINGECTOMY AND REMOVAL RIGHT ECTOPIC PREG.  02/23/2004   EXTRACORPOREAL SHOCK WAVE LITHOTRIPSY  2001  &  2002   LAPAROSCOPIC CHOLECYSTECTOMY  1999   POLYPECTOMY     ROBOTIC ASSISTED LAPAROSCOPIC SACROCOLPOPEXY N/A 12/12/2022   Procedure: XI ROBOTIC ASSISTED LAPAROSCOPIC SACROCOLPOPEXY;  Surgeon: Marguerita Beards, MD;   Location: [facility];  Service: Gynecology;  Laterality: N/A;  Total time requested 3 hours -Assist requested   SHOULDER ARTHROSCOPY WITH OPEN ROTATOR CUFF REPAIR Right 2012   TOTAL ABDOMINAL HYSTERECTOMY W/ BILATERAL OOPHORECTOMY AND LYSIS ADHESIONS  09/02/2007   TUBAL LIGATION Bilateral 1995   UMBILICAL HERNIA REPAIR  2003   unsure if she has mesh   VAGINAL HYSTERECTOMY N/A 08/18/2015   Procedure: Exam under Anesthesia, Excision Xenform Graft, Removal Bilateral Sacrospinous Ligament sutures;  Surgeon: Patton Salles, MD;  Location: [facility];  Service: Gynecology;  Laterality: N/A;    Family History  Problem Relation Age of Onset   Kidney disease Mother    Hypertension Mother    Colon polyps Mother    Diabetes Father    Colon polyps Father    Drug abuse Sister    Cancer Paternal Uncle        Pancreas   Pancreatic cancer Paternal Uncle    Heart attack Maternal Grandmother    Breast cancer Paternal Grandmother    Esophageal cancer Neg Hx    Stomach cancer Neg Hx    Rectal cancer Neg Hx    Colon cancer Neg Hx      Current Outpatient Medications:    metFORMIN (GLUCOPHAGE) 500 MG tablet, Take 1 tablet (500 mg total) by mouth 2 (two) times daily with a meal., Disp: 60 tablet, Rfl: 5   ALPRAZolam (XANAX) 1 MG tablet, Take 1 tablet by mouth 2 (two) times daily as needed., Disp: , Rfl:    doxycycline (VIBRAMYCIN) 100 MG capsule, Take 1 capsule (100 mg total) by mouth daily., Disp: 90 capsule, Rfl: 3   estradiol (ESTRACE) 0.1 MG/GM vaginal cream, Use 1/2 g vaginally every night for the first 2 weeks, then use 1/2 g vaginally two or three times per week as needed to maintain symptom relief., Disp: 42.5 g, Rfl: 2   metroNIDAZOLE (METROCREAM) 0.75 % cream, Apply topically 2 (two) times daily., Disp: 45 g, Rfl: 3   Multiple Vitamin (MULTIVITAMIN WITH MINERALS) TABS tablet, Take 1 tablet by mouth daily., Disp: , Rfl:    phentermine 30 MG capsule, Take 1 capsule (30 mg  total) by mouth every morning., Disp: 30 capsule, Rfl: 5   pramipexole (MIRAPEX) 1 MG tablet, Take 1 tablet (1 mg total) by mouth in the morning and at bedtime., Disp: 60 tablet, Rfl: 5   Semaglutide,0.25 or 0.5MG /DOS, (OZEMPIC, 0.25 OR 0.5 MG/DOSE,) 2 MG/3ML SOPN, Inject 0.25 mg into the skin once a week., Disp: 3 mL, Rfl: 0  EXAM:  VITALS per patient if applicable:  GENERAL: alert, oriented, appears well and in no acute distress  HEENT: atraumatic, conjunttiva clear, no obvious abnormalities on inspection of external nose and ears  NECK: normal movements of the head and neck  LUNGS: on inspection no signs of respiratory distress, breathing rate appears normal, no obvious gross SOB, gasping or wheezing  CV: no obvious cyanosis  MS: moves all visible extremities without noticeable abnormality  PSYCH/NEURO: pleasant and cooperative, no obvious depression or anxiety, speech and thought processing grossly intact  ASSESSMENT AND PLAN: Obesity. We will add Topiramate 50 mg  daily to the Phentermine. Report back in 2 weeks. Gershon Crane, MD  Discussed the following assessment and plan:  No diagnosis found.     I discussed the assessment and treatment plan with the patient. The patient was provided an opportunity to ask questions and all were answered. The patient agreed with the plan and demonstrated an understanding of the instructions.   The patient was advised to call back or seek an in-person evaluation if the symptoms worsen or if the condition fails to improve as anticipated.      Review of Systems     Objective:   Physical Exam        Assessment & Plan:

## 2023-05-06 NOTE — Telephone Encounter (Signed)
Pt Plan has denied the PA for Ozempic, pt is aware

## 2023-05-10 NOTE — Telephone Encounter (Signed)
I think she should keep taking Metformin for awhile. Some people lose weight on it, just not as rapidly as with the shots

## 2023-05-28 ENCOUNTER — Encounter: Payer: Self-pay | Admitting: Family Medicine

## 2023-05-28 ENCOUNTER — Telehealth (INDEPENDENT_AMBULATORY_CARE_PROVIDER_SITE_OTHER): Payer: 59 | Admitting: Family Medicine

## 2023-05-28 DIAGNOSIS — E669 Obesity, unspecified: Secondary | ICD-10-CM

## 2023-05-28 NOTE — Progress Notes (Signed)
Subjective:    Patient ID: [name], female    DOB: 03/14/1966, 57 y.o.   MRN: [id number]  HPI Virtual Visit via Video Note  I connected with the patient on 05/28/23 at  9:45 AM EDT by a video enabled telemedicine application and verified that I am speaking with the correct person using two identifiers.  Location patient: home Location provider:work or home office Persons participating in the virtual visit: patient, provider  I discussed the limitations of evaluation and management by telemedicine and the availability of in person appointments. The patient expressed understanding and agreed to proceed.   HPI: Here to follow up on her weight loss medications. Several weeks ago we added Topiramate to her Phentermine, and this has been working much better for her. She has less cravings, and she has lost 5 lbs.    ROS: See pertinent positives and negatives per HPI.  Past Medical History:  Diagnosis Date   Allergy    Anxiety    Depression    GERD (gastroesophageal reflux disease)    History of abnormal cervical Pap smear    1991 -- 1995--hx cryotherapy to cervix   History of colon polyps    2008- BENIGN   Hydronephrosis, left    Kidney stones 2016   PONV (postoperative nausea and vomiting)    Rosacea     Past Surgical History:  Procedure Laterality Date   ABDOMINAL HYSTERECTOMY     ANTERIOR AND POSTERIOR REPAIR WITH SACROSPINOUS FIXATION N/A 08/16/2015   Procedure: ANTERIOR COLPORRHAPHY WITH XENOFORM GRAFT AND SACROSPINOUS FIXATION;  Surgeon: Patton Salles, MD;  Location: [facility];  Service: Gynecology;  Laterality: N/A;  2.5 hours OR time   BLADDER SUSPENSION N/A 08/16/2015   Procedure: TRANSVAGINAL TAPE (TVT) PROCEDURE exact midurethral sling;  Surgeon: Patton Salles, MD;  Location: [facility];  Service: Gynecology;  Laterality: N/A;   COLONOSCOPY  08/21/2017   per Dr. Christella Hartigan, clear, repeat in 10 yrs   CYSTOCELE REPAIR N/A 12/12/2022    Procedure: ANTERIOR REPAIR (CYSTOCELE);  Surgeon: Marguerita Beards, MD;  Location: [facility];  Service: Gynecology;  Laterality: N/A;   CYSTOSCOPY N/A 08/16/2015   Procedure: CYSTOSCOPY;  Surgeon: Patton Salles, MD;  Location: [facility];  Service: Gynecology;  Laterality: N/A;   CYSTOSCOPY N/A 12/12/2022   Procedure: CYSTOSCOPY;  Surgeon: Marguerita Beards, MD;  Location: [facility];  Service: Gynecology;  Laterality: N/A;   CYSTOSCOPY W/ RETROGRADES Bilateral 06/22/2015   Procedure: CYSTOSCOPY WITH RETROGRADE PYELOGRAM;  Surgeon: Malen Gauze, MD;  Location: [facility];  Service: Urology;  Laterality: Bilateral;   CYSTOSCOPY W/ URETERAL STENT PLACEMENT  01/1999   CYSTOSCOPY WITH HOLMIUM LASER LITHOTRIPSY Left 05/18/2015   Procedure: CYSTOSCOPY WITH HOLMIUM LASER LITHOTRIPSY;  Surgeon: Sebastian Ache, MD;  Location[facility];  Service: Urology;  Laterality: Left;   CYSTOSCOPY WITH RETROGRADE PYELOGRAM, URETEROSCOPY AND STENT PLACEMENT Left 06/22/2015   Procedure: CYSTOSCOPY,  LEFT URETEROSCOPY;  Surgeon: Malen Gauze, MD;  Location: [facility];  Service: Urology;  Laterality: Left;   CYSTOSCOPY WITH URETEROSCOPY AND STENT PLACEMENT Left 05/18/2015   Procedure: CYSTOSCOPY WITH URETEROSCOPY, STONE MANIPULATION AND STENT PLACEMENT;  Surgeon: Sebastian Ache, MD;  Location: [facility];  Service: Urology;  Laterality: Left;   DIAGNOSTIC LAPAROSCOPY     DX LAPAROSCOPY  X2   ESOPHAGOGASTRODUODENOSCOPY  05/09/2007   EXPLORATORY LAPAROTOMY W/ BILATERAL SALPINGECTOMY  AND REMOVAL RIGHT ECTOPIC PREG.  02/23/2004   EXTRACORPOREAL SHOCK WAVE LITHOTRIPSY  2001  &  2002   LAPAROSCOPIC CHOLECYSTECTOMY  1999   POLYPECTOMY     ROBOTIC ASSISTED LAPAROSCOPIC SACROCOLPOPEXY N/A 12/12/2022   Procedure: XI ROBOTIC ASSISTED LAPAROSCOPIC SACROCOLPOPEXY;  Surgeon: Marguerita Beards, MD;   Location: [facility];  Service: Gynecology;  Laterality: N/A;  Total time requested 3 hours -Assist requested   SHOULDER ARTHROSCOPY WITH OPEN ROTATOR CUFF REPAIR Right 2012   TOTAL ABDOMINAL HYSTERECTOMY W/ BILATERAL OOPHORECTOMY AND LYSIS ADHESIONS  09/02/2007   TUBAL LIGATION Bilateral 1995   UMBILICAL HERNIA REPAIR  2003   unsure if she has mesh   VAGINAL HYSTERECTOMY N/A 08/18/2015   Procedure: Exam under Anesthesia, Excision Xenform Graft, Removal Bilateral Sacrospinous Ligament sutures;  Surgeon: Patton Salles, MD;  Location: [facility];  Service: Gynecology;  Laterality: N/A;    Family History  Problem Relation Age of Onset   Kidney disease Mother    Hypertension Mother    Colon polyps Mother    Diabetes Father    Colon polyps Father    Drug abuse Sister    Cancer Paternal Uncle        Pancreas   Pancreatic cancer Paternal Uncle    Heart attack Maternal Grandmother    Breast cancer Paternal Grandmother    Esophageal cancer Neg Hx    Stomach cancer Neg Hx    Rectal cancer Neg Hx    Colon cancer Neg Hx      Current Outpatient Medications:    ALPRAZolam (XANAX) 1 MG tablet, Take 1 tablet by mouth 2 (two) times daily as needed., Disp: , Rfl:    doxycycline (VIBRAMYCIN) 100 MG capsule, Take 1 capsule (100 mg total) by mouth daily., Disp: 90 capsule, Rfl: 3   estradiol (ESTRACE) 0.1 MG/GM vaginal cream, Use 1/2 g vaginally every night for the first 2 weeks, then use 1/2 g vaginally two or three times per week as needed to maintain symptom relief., Disp: 42.5 g, Rfl: 2   metFORMIN (GLUCOPHAGE) 500 MG tablet, Take 1 tablet (500 mg total) by mouth 2 (two) times daily with a meal., Disp: 60 tablet, Rfl: 5   metroNIDAZOLE (METROCREAM) 0.75 % cream, Apply topically 2 (two) times daily., Disp: 45 g, Rfl: 3   Multiple Vitamin (MULTIVITAMIN WITH MINERALS) TABS tablet, Take 1 tablet by mouth daily., Disp: , Rfl:    phentermine 30 MG capsule, Take 1 capsule (30 mg  total) by mouth every morning., Disp: 30 capsule, Rfl: 5   pramipexole (MIRAPEX) 1 MG tablet, Take 1 tablet (1 mg total) by mouth in the morning and at bedtime., Disp: 60 tablet, Rfl: 5   topiramate (TOPAMAX) 50 MG tablet, Take 1 tablet (50 mg total) by mouth daily., Disp: 30 tablet, Rfl: 2  EXAM:  VITALS per patient if applicable:  GENERAL: alert, oriented, appears well and in no acute distress  HEENT: atraumatic, conjunttiva clear, no obvious abnormalities on inspection of external nose and ears  NECK: normal movements of the head and neck  LUNGS: on inspection no signs of respiratory distress, breathing rate appears normal, no obvious gross SOB, gasping or wheezing  CV: no obvious cyanosis  MS: moves all visible extremities without noticeable abnormality  PSYCH/NEURO: pleasant and cooperative, no obvious depression or anxiety, speech and thought processing grossly intact  ASSESSMENT AND PLAN: Obesity, she will stay on the current regimen. We will see her again next month for  a well exam. Gershon Crane, MD  Discussed the following assessment and plan:  No diagnosis found.     I discussed the assessment and treatment plan with the patient. The patient was provided an opportunity to ask questions and all were answered. The patient agreed with the plan and demonstrated an understanding of the instructions.   The patient was advised to call back or seek an in-person evaluation if the symptoms worsen or if the condition fails to improve as anticipated.      Review of Systems     Objective:   Physical Exam        Assessment & Plan:

## 2023-07-02 ENCOUNTER — Telehealth: Payer: Self-pay | Admitting: Family Medicine

## 2023-07-02 ENCOUNTER — Telehealth (INDEPENDENT_AMBULATORY_CARE_PROVIDER_SITE_OTHER): Payer: 59 | Admitting: Family Medicine

## 2023-07-02 ENCOUNTER — Encounter: Payer: Self-pay | Admitting: Family Medicine

## 2023-07-02 DIAGNOSIS — R6882 Decreased libido: Secondary | ICD-10-CM

## 2023-07-02 NOTE — Telephone Encounter (Signed)
Prescription Request  07/02/2023  LOV: 07/13/2022  What is the name of the medication or equipment? ALPRAZolam (XANAX) 1 MG tablet   Pt's current pharmacy does not have it at the moment so, Pt is asking that Rx be sent to Pharmacy below  Have you contacted your pharmacy to request a refill? No   Which pharmacy would you like this sent to?   [phone], [place] - [address] [phone] [place] [address]   Patient notified that their request is being sent to the clinical staff for review and that they should receive a response within 2 business days.   Please advise at Mobile [phone] (mobile)

## 2023-07-02 NOTE — Progress Notes (Signed)
Subjective:    Patient ID: [name], female    DOB: 08/30/1966, 57 y.o.   MRN: [id number]  HPI Virtual Visit via Video Note  I connected with the patient on 07/02/23 at  9:30 AM EDT by a video enabled telemedicine application and verified that I am speaking with the correct person using two identifiers.  Location patient: home Location provider:work or home office Persons participating in the virtual visit: patient, provider  I discussed the limitations of evaluation and management by telemedicine and the availability of in person appointments. The patient expressed understanding and agreed to proceed.   HPI: She originally made this appt to discuss her low libido. This has been an issue for the past year or so, but it has been worse lately. Having said that, she tells me she and her husband have been having problems, and she has decided to leave him . She is moving out today to live with her son in [place].    ROS: See pertinent positives and negatives per HPI.  Past Medical History:  Diagnosis Date   Allergy    Anxiety    Depression    GERD (gastroesophageal reflux disease)    History of abnormal cervical Pap smear    1991 -- 1995--hx cryotherapy to cervix   History of colon polyps    2008- BENIGN   Hydronephrosis, left    Kidney stones 2016   PONV (postoperative nausea and vomiting)    Rosacea     Past Surgical History:  Procedure Laterality Date   ABDOMINAL HYSTERECTOMY     ANTERIOR AND POSTERIOR REPAIR WITH SACROSPINOUS FIXATION N/A 08/16/2015   Procedure: ANTERIOR COLPORRHAPHY WITH XENOFORM GRAFT AND SACROSPINOUS FIXATION;  Surgeon: Patton Salles, MD;  Location: [facility];  Service: Gynecology;  Laterality: N/A;  2.5 hours OR time   BLADDER SUSPENSION N/A 08/16/2015   Procedure: TRANSVAGINAL TAPE (TVT) PROCEDURE exact midurethral sling;  Surgeon: Patton Salles, MD;  Location: [facility];  Service: Gynecology;  Laterality: N/A;    COLONOSCOPY  08/21/2017   per Dr. Christella Hartigan, clear, repeat in 10 yrs   CYSTOCELE REPAIR N/A 12/12/2022   Procedure: ANTERIOR REPAIR (CYSTOCELE);  Surgeon: Marguerita Beards, MD;  Location: [facility];  Service: Gynecology;  Laterality: N/A;   CYSTOSCOPY N/A 08/16/2015   Procedure: CYSTOSCOPY;  Surgeon: Patton Salles, MD;  Location: [facility];  Service: Gynecology;  Laterality: N/A;   CYSTOSCOPY N/A 12/12/2022   Procedure: CYSTOSCOPY;  Surgeon: Marguerita Beards, MD;  Location: [facility];  Service: Gynecology;  Laterality: N/A;   CYSTOSCOPY W/ RETROGRADES Bilateral 06/22/2015   Procedure: CYSTOSCOPY WITH RETROGRADE PYELOGRAM;  Surgeon: Malen Gauze, MD;  Location: [facility];  Service: Urology;  Laterality: Bilateral;   CYSTOSCOPY W/ URETERAL STENT PLACEMENT  01/1999   CYSTOSCOPY WITH HOLMIUM LASER LITHOTRIPSY Left 05/18/2015   Procedure: CYSTOSCOPY WITH HOLMIUM LASER LITHOTRIPSY;  Surgeon: Sebastian Ache, MD;  Location: [facility];  Service: Urology;  Laterality: Left;   CYSTOSCOPY WITH RETROGRADE PYELOGRAM, URETEROSCOPY AND STENT PLACEMENT Left 06/22/2015   Procedure: CYSTOSCOPY,  LEFT URETEROSCOPY;  Surgeon: Malen Gauze, MD;  Location: [facility];  Service: Urology;  Laterality: Left;   CYSTOSCOPY WITH URETEROSCOPY AND STENT PLACEMENT Left 05/18/2015   Procedure: CYSTOSCOPY WITH URETEROSCOPY, STONE MANIPULATION AND STENT PLACEMENT;  Surgeon: Sebastian Ache, MD;  Location: [facility];  Service: Urology;  Laterality: Left;  DIAGNOSTIC LAPAROSCOPY     DX LAPAROSCOPY  X2   ESOPHAGOGASTRODUODENOSCOPY  05/09/2007   EXPLORATORY LAPAROTOMY W/ BILATERAL SALPINGECTOMY AND REMOVAL RIGHT ECTOPIC PREG.  02/23/2004   EXTRACORPOREAL SHOCK WAVE LITHOTRIPSY  2001  &  2002   LAPAROSCOPIC CHOLECYSTECTOMY  1999   POLYPECTOMY     ROBOTIC ASSISTED LAPAROSCOPIC SACROCOLPOPEXY N/A 12/12/2022    Procedure: XI ROBOTIC ASSISTED LAPAROSCOPIC SACROCOLPOPEXY;  Surgeon: Marguerita Beards, MD;  Location: [facility];  Service: Gynecology;  Laterality: N/A;  Total time requested 3 hours -Assist requested   SHOULDER ARTHROSCOPY WITH OPEN ROTATOR CUFF REPAIR Right 2012   TOTAL ABDOMINAL HYSTERECTOMY W/ BILATERAL OOPHORECTOMY AND LYSIS ADHESIONS  09/02/2007   TUBAL LIGATION Bilateral 1995   UMBILICAL HERNIA REPAIR  2003   unsure if she has mesh   VAGINAL HYSTERECTOMY N/A 08/18/2015   Procedure: Exam under Anesthesia, Excision Xenform Graft, Removal Bilateral Sacrospinous Ligament sutures;  Surgeon: Patton Salles, MD;  Location: [facility];  Service: Gynecology;  Laterality: N/A;    Family History  Problem Relation Age of Onset   Kidney disease Mother    Hypertension Mother    Colon polyps Mother    Diabetes Father    Colon polyps Father    Drug abuse Sister    Cancer Paternal Uncle        Pancreas   Pancreatic cancer Paternal Uncle    Heart attack Maternal Grandmother    Breast cancer Paternal Grandmother    Esophageal cancer Neg Hx    Stomach cancer Neg Hx    Rectal cancer Neg Hx    Colon cancer Neg Hx      Current Outpatient Medications:    ALPRAZolam (XANAX) 1 MG tablet, Take 1 tablet by mouth 2 (two) times daily as needed., Disp: , Rfl:    doxycycline (VIBRAMYCIN) 100 MG capsule, Take 1 capsule (100 mg total) by mouth daily., Disp: 90 capsule, Rfl: 3   estradiol (ESTRACE) 0.1 MG/GM vaginal cream, Use 1/2 g vaginally every night for the first 2 weeks, then use 1/2 g vaginally two or three times per week as needed to maintain symptom relief., Disp: 42.5 g, Rfl: 2   metroNIDAZOLE (METROCREAM) 0.75 % cream, Apply topically 2 (two) times daily., Disp: 45 g, Rfl: 3   Multiple Vitamin (MULTIVITAMIN WITH MINERALS) TABS tablet, Take 1 tablet by mouth daily., Disp: , Rfl:    phentermine 30 MG capsule, Take 1 capsule (30 mg total) by mouth every morning., Disp:  30 capsule, Rfl: 5   pramipexole (MIRAPEX) 1 MG tablet, Take 1 tablet (1 mg total) by mouth in the morning and at bedtime., Disp: 60 tablet, Rfl: 5   topiramate (TOPAMAX) 50 MG tablet, Take 1 tablet (50 mg total) by mouth daily., Disp: 30 tablet, Rfl: 2  EXAM:  VITALS per patient if applicable:  GENERAL: alert, oriented, appears well and in no acute distress  HEENT: atraumatic, conjunttiva clear, no obvious abnormalities on inspection of external nose and ears  NECK: normal movements of the head and neck  LUNGS: on inspection no signs of respiratory distress, breathing rate appears normal, no obvious gross SOB, gasping or wheezing  CV: no obvious cyanosis  MS: moves all visible extremities without noticeable abnormality  PSYCH/NEURO: pleasant and cooperative, no obvious depression or anxiety, speech and thought processing grossly intact  ASSESSMENT AND PLAN: Low libido, probably related to the marital stress she has been going through. We agreed that she will stay on her  current medications, and we will revisit the issue when she comes in for a well exam on 07-16-23. Gershon Crane, MD  Discussed the following assessment and plan:  No diagnosis found.     I discussed the assessment and treatment plan with the patient. The patient was provided an opportunity to ask questions and all were answered. The patient agreed with the plan and demonstrated an understanding of the instructions.   The patient was advised to call back or seek an in-person evaluation if the symptoms worsen or if the condition fails to improve as anticipated.      Review of Systems     Objective:   Physical Exam        Assessment & Plan:

## 2023-07-03 MED ORDER — ALPRAZOLAM 1 MG PO TABS: 1.0000 mg | ORAL_TABLET | Freq: Two times a day (BID) | ORAL | 5 refills | Status: DC | PRN

## 2023-07-03 NOTE — Telephone Encounter (Signed)
Done

## 2023-07-03 NOTE — Telephone Encounter (Signed)
Pt LOV was on 07/02/23 Refill was last done by Historical provider Please advise

## 2023-07-11 ENCOUNTER — Ambulatory Visit (INDEPENDENT_AMBULATORY_CARE_PROVIDER_SITE_OTHER): Payer: 59 | Admitting: Psychology

## 2023-07-11 DIAGNOSIS — F411 Generalized anxiety disorder: Secondary | ICD-10-CM | POA: Diagnosis not present

## 2023-07-11 DIAGNOSIS — F330 Major depressive disorder, recurrent, mild: Secondary | ICD-10-CM | POA: Diagnosis not present

## 2023-07-11 NOTE — Progress Notes (Signed)
[facility] Counselor/Therapist Progress Note  Patient ID: [name], MRN: [id number],    Date: 07/11/23   Time Spent: 8:00am-8:51am  Treatment Type: Individual Therapy  Pt is seen for a virtual video visit via caregility. Pt consents to virtual visit and is aware of limitations of virtual visits.  Pt joins from her home, reporting privacy, and counselor from her office.     Reported Symptoms: Increased anxiety, increased escalations, increased feeling overwhelmed and exhausted  Mental Status Exam: Appearance:  Well Groomed     Behavior: Appropriate  Motor: Normal  Speech/Language:  Normal Rate  Affect: Appropriate  Mood: anxious  Thought process: normal  Thought content:   WNL  Sensory/Perceptual disturbances:   WNL  Orientation: oriented to person, place, time/date, and situation  Attention: Good  Concentration: Good  Memory: WNL  Fund of knowledge:  Good  Insight:   Good  Judgment:  Good  Impulse Control: Good   Risk Assessment: Danger to Self:  No Self-injurious Behavior: No Danger to Others: No Duty to Warn:no Physical Aggression / Violence:No  Access to Firearms a concern: No  Gang Involvement:No   Subjective: Counselor assessed pt current functioning per pt report.  Processed w/pt return to counseling and emotional escalations.   Explored contributing factors and stressors.   Explored plan to resume self care and stress management plan.  Provided psychiatric referral.  Reviewed and updated tx plan.  Pt affect wnl.  Pt reached out to counselor by email this week seeking to resume counseling as last seen 02/2023.  Pt reports she has been having increased anxiety, easily upset, escalations w/ husband, feeling overwhelmed and fatigued.  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  Pt also reports managing another litter of pups and  watching her 5y/o granddaughter this summer.  Pt reported she is not taking any medications for mood besides the Xanax Prn.  Pt reported that she hasn't been focused on time for self as pulled in so many directions.  Pt aware of need for support w/ counseling again to assist coping and return to coping plan.  Pt receptive to potential of medication and referral to psychiatrist.    Interventions: Cognitive Behavioral Therapy, Mindfulness Meditation, and supportive  Diagnosis:Generalized anxiety disorder  Mild episode of recurrent major depressive disorder (HCC)  Plan: Pt to f/u w/ Counseling in 1-2 weeks.  Practice mindful/grounding breathe practice and daily movement.  Referred for psychiatrist.  F/u as scheduled w/ PCP.      Individualized Treatment Plan Strengths: seeking counseling, enjoys getting nails done, enjoys walking and movement, enjoys the beach, family Sunday dinners  Supports: her husband, her daughter in law and her mom    Goal/Needs for Treatment:  In order of importance to patient 1) continue to cope with anxiety 2) increase self worth      Client Statement of Needs: "focus on my self and continue building my self esteem and feel better about myself and confidence. Continue to maintain progress w/ coping w/ anxiety, being able to use mindfulness and deescalate.   07/11/23 "return to counseling to help get my anxiety under control again"    Treatment Level: outpatient counseling  Symptoms:anxiety, worry, ruminating thoughts, low self worth, emotional escalations, depressed mood, sleep disturbance  Client Treatment Preferences:Monthly counseling and continue medication management w/ PCP 07/11/23 increase to every 1-2 weeks, referral for psychiatrist  to restart medication.    Healthcare consumer's goal for treatment:   Counselor, Forde Radon, [facility] will support the patient's ability to achieve the goals identified. Cognitive Behavioral Therapy, Assertive  Communication/Conflict Resolution Training, Relaxation Training, ACT, Humanistic and other evidenced-based practices will be used to promote progress towards healthy functioning.    Healthcare consumer will: Actively participate in therapy, working towards healthy functioning.     *Justification for Continuation/Discontinuation of Goal: R=Revised, O=Ongoing, A=Achieved, D=Discontinued   Goal 1) Pt will increase coping with life's anxiety and stressors AEB decreased anxiety, decreased emotional escalations, decreased sleep disturbance, increased awareness of thought patterns that impact anxiety. Baseline date 03/04/23: Progress towards goal 30%; How Often - Daily Target Date Goal Was reviewed Status Code Progress towards goal  03/03/24  07/11/23  O  returning to counseling    03/03/24                   Goal 2)  Increase self awareness, identifying her likes/values/interest and increase feeling positive self worth w/ who she is as demonstrated by expressing and communicating in sessions.  Baseline date 03/04/23: Progress towards goal 25; How Often - Daily   Target Date Goal Was reviewed Status Code Progress towards goal/Likert rating  03/03/24  07/11/23  O  returning to counseling                      This plan has been reviewed and created by the following participants:  This plan will be reviewed at least every 12 months. Date Behavioral Health Clinician Date Guardian/Patient   03/04/23 [facility] [name] [facility]                      03/04/23 Verbal consent provided  07/11/23 [name] MH [name] [facility]     07/11/23  verbal consent        Forde Radon [facility]

## 2023-07-16 ENCOUNTER — Encounter: Payer: 59 | Admitting: Family Medicine

## 2023-07-24 ENCOUNTER — Ambulatory Visit: Payer: 59 | Admitting: Obstetrics and Gynecology

## 2023-07-30 ENCOUNTER — Ambulatory Visit: Payer: 59 | Admitting: Psychology

## 2023-07-30 DIAGNOSIS — F411 Generalized anxiety disorder: Secondary | ICD-10-CM | POA: Diagnosis not present

## 2023-07-30 DIAGNOSIS — F330 Major depressive disorder, recurrent, mild: Secondary | ICD-10-CM | POA: Diagnosis not present

## 2023-07-30 NOTE — Progress Notes (Signed)
[facility] Counselor/Therapist Progress Note  Patient ID: [name], MRN: [id number],    Date: 07/30/23   Time Spent: 12:00pm-12:55pm  Treatment Type: Individual Therapy  Pt is seen for a virtual video visit via caregility. Pt consents to virtual visit and is aware of limitations of virtual visits.  Pt joins from her home, reporting privacy, and counselor from her home office.     Reported Symptoms: decreased anxiety escalations, continuing to feel overwhelmed and good insight into needs to say no and self care focus  Mental Status Exam: Appearance:  Well Groomed     Behavior: Appropriate  Motor: Normal  Speech/Language:  Normal Rate  Affect: Appropriate  Mood: anxious  Thought process: normal  Thought content:   WNL  Sensory/Perceptual disturbances:   WNL  Orientation: oriented to person, place, time/date, and situation  Attention: Good  Concentration: Good  Memory: WNL  Fund of knowledge:  Good  Insight:   Good  Judgment:  Good  Impulse Control: Good   Risk Assessment: Danger to Self:  No Self-injurious Behavior: No Danger to Others: No Duty to Warn:no Physical Aggression / Violence:No  Access to Firearms a concern: No  Gang Involvement:No   Subjective: Counselor assessed pt current functioning per pt report.  Processed w/pt positives and stressors since last visit.  Explored how past some major time commitments and shifting focus to self care intentions.  Discussed planning for this/time management.  Reflected pt recognizing has taken on too much and need to say no and assertive ways to do so. Pt affect wnl.  Pt reports overall less anxious.  Pt reports still overwhelmed and a lot of things on her plate.  Pt reports some things have wrapped up- puppies all sold, past the wedding.  Pt reports need to take time to focus on her own time again for movement and meditations.  Pt reports that she also recognized that having to set boundaries/say no  for husband's business and her child care for son.  Pt discussed enjoying mom's visit for the wedding and difficulty saying goodbye and how was emotional for couple days.  Pt discussed ways she managed through feelings and expressed.    Interventions: Cognitive Behavioral Therapy, Mindfulness Meditation, and supportive  Diagnosis:Generalized anxiety disorder  Mild episode of recurrent major depressive disorder (HCC)  Plan: Pt to f/u w/ Counseling in 1-2 weeks.  Practice mindful/grounding breathe practice and daily movement.  Referred for psychiatrist.  F/u as scheduled w/ PCP.      Individualized Treatment Plan Strengths: seeking counseling, enjoys getting nails done, enjoys walking and movement, enjoys the beach, family Sunday dinners  Supports: her husband, her daughter in law and her mom    Goal/Needs for Treatment:  In order of importance to patient 1) continue to cope with anxiety 2) increase self worth      Client Statement of Needs: "focus on my self and continue building my self esteem and feel better about myself and confidence. Continue to maintain progress w/ coping w/ anxiety, being able to use mindfulness and deescalate.   07/11/23 "return to counseling to help get my anxiety under control again"    Treatment Level: outpatient counseling  Symptoms:anxiety, worry, ruminating thoughts, low self worth, emotional escalations, depressed mood, sleep disturbance  Client Treatment Preferences:Monthly counseling and continue medication management w/ PCP 07/11/23 increase to every 1-2 weeks, referral for psychiatrist to restart medication.    Healthcare consumer's goal for treatment:   Counselor, [name],  [name] will support the patient's ability to achieve the goals identified. Cognitive Behavioral Therapy, Assertive Communication/Conflict Resolution Training, Relaxation Training, ACT, Humanistic and other evidenced-based practices will be used to promote progress towards healthy  functioning.    Healthcare consumer will: Actively participate in therapy, working towards healthy functioning.     *Justification for Continuation/Discontinuation of Goal: R=Revised, O=Ongoing, A=Achieved, D=Discontinued   Goal 1) Pt will increase coping with life's anxiety and stressors AEB decreased anxiety, decreased emotional escalations, decreased sleep disturbance, increased awareness of thought patterns that impact anxiety. Baseline date 03/04/23: Progress towards goal 30%; How Often - Daily Target Date Goal Was reviewed Status Code Progress towards goal  03/03/24  07/11/23  O  returning to counseling    03/03/24                   Goal 2)  Increase self awareness, identifying her likes/values/interest and increase feeling positive self worth w/ who she is as demonstrated by expressing and communicating in sessions.  Baseline date 03/04/23: Progress towards goal 25; How Often - Daily   Target Date Goal Was reviewed Status Code Progress towards goal/Likert rating  03/03/24  07/11/23  O  returning to counseling                      This plan has been reviewed and created by the following participants:  This plan will be reviewed at least every 12 months. Date Behavioral Health Clinician Date Guardian/Patient   03/04/23 [name], The [name]                      03/04/23 [name]  07/11/23 [name]     07/11/23  Brittany consent            [name]

## 2023-08-01 ENCOUNTER — Encounter: Payer: 59 | Admitting: Family Medicine

## 2023-08-01 ENCOUNTER — Other Ambulatory Visit: Payer: Self-pay | Admitting: Family Medicine

## 2023-08-09 ENCOUNTER — Ambulatory Visit (INDEPENDENT_AMBULATORY_CARE_PROVIDER_SITE_OTHER): Payer: 59 | Admitting: Obstetrics and Gynecology

## 2023-08-09 ENCOUNTER — Other Ambulatory Visit (HOSPITAL_COMMUNITY): Admission: RE | Admit: 2023-08-09 | Payer: 59

## 2023-08-09 ENCOUNTER — Encounter: Payer: Self-pay | Admitting: Obstetrics and Gynecology

## 2023-08-09 ENCOUNTER — Other Ambulatory Visit (HOSPITAL_COMMUNITY)
Admission: RE | Admit: 2023-08-09 | Discharge: 2023-08-09 | Disposition: A | Discharge status: Home / Self Care | Payer: 59 | Source: Ambulatory Visit | Attending: Obstetrics and Gynecology | Admitting: Obstetrics and Gynecology

## 2023-08-09 VITALS — BP 117/75 | HR 67

## 2023-08-09 DIAGNOSIS — N952 Postmenopausal atrophic vaginitis: Secondary | ICD-10-CM | POA: Diagnosis present

## 2023-08-09 DIAGNOSIS — B9689 Other specified bacterial agents as the cause of diseases classified elsewhere: Secondary | ICD-10-CM

## 2023-08-09 DIAGNOSIS — N760 Acute vaginitis: Secondary | ICD-10-CM

## 2023-08-09 DIAGNOSIS — B379 Candidiasis, unspecified: Secondary | ICD-10-CM

## 2023-08-09 DIAGNOSIS — M62838 Other muscle spasm: Secondary | ICD-10-CM

## 2023-08-09 DIAGNOSIS — R350 Frequency of micturition: Secondary | ICD-10-CM | POA: Diagnosis not present

## 2023-08-09 DIAGNOSIS — R82998 Other abnormal findings in urine: Secondary | ICD-10-CM | POA: Insufficient documentation

## 2023-08-09 DIAGNOSIS — N8110 Cystocele, unspecified: Secondary | ICD-10-CM

## 2023-08-09 LAB — POCT URINALYSIS DIPSTICK
Bilirubin, UA: NEGATIVE
Glucose, UA: NEGATIVE
Ketones, UA: NEGATIVE
Nitrite, UA: NEGATIVE
Protein, UA: NEGATIVE
Spec Grav, UA: 1.02 (ref 1.010–1.025)
Urobilinogen, UA: 0.2 E.U./dL
pH, UA: 7 (ref 5.0–8.0)

## 2023-08-09 MED ORDER — ESTRADIOL 0.1 MG/GM VA CREA: TOPICAL_CREAM | VAGINAL | 11 refills | Status: DC

## 2023-08-09 NOTE — Patient Instructions (Signed)
Start vaginal estrogen therapy nightly for two weeks then 2 times weekly at night for treatment of vaginal atrophy (dryness of the vaginal tissues).  Please let us know if the prescription is too expensive and we can look for alternative options.   

## 2023-08-09 NOTE — Progress Notes (Signed)
[facility] Return Visit  SUBJECTIVE  History of Present Illness: [name] is a 57 y.o. female seen in follow-up. Surgery: s/p Robotic assisted sacrocolpopexy, lysis of adhesions, anterior repair, cystoscopy on 12/13/23  She has felt a bulge for about a month or so- can feel at the opening. Has a lot of vaginal dryness and discomfort with intercourse. She uses coconut oil for lubricant but not on a daily basis. Noticed some vaginal burning. Also has pelvic cramping intermittently. Denies bladder leakage.   Past Medical History: Patient  has a past medical history of Allergy, Anxiety, Depression, GERD (gastroesophageal reflux disease), History of abnormal cervical Pap smear, History of colon polyps, Hydronephrosis, left, Kidney stones (2016), PONV (postoperative nausea and vomiting), and Rosacea.   Past Surgical History: She  has a past surgical history that includes Laparoscopic cholecystectomy (1999); Tubal ligation (Bilateral, 1995); Cystoscopy w/ ureteral stent placement (01/1999); Extracorporeal shock wave lithotripsy (2001  &  2002); DX LAPAROSCOPY (X2); EXPLORATORY LAPAROTOMY W/ BILATERAL SALPINGECTOMY AND REMOVAL RIGHT ECTOPIC PREG. (02/23/2004); TOTAL ABDOMINAL HYSTERECTOMY W/ BILATERAL OOPHORECTOMY AND LYSIS ADHESIONS (09/02/2007); Esophagogastroduodenoscopy (05/09/2007); Umbilical hernia repair (2003); Shoulder arthroscopy with open rotator cuff repair (Right, 2012); Cystoscopy with ureteroscopy and stent placement (Left, 05/18/2015); Cystoscopy with holmium laser lithotripsy (Left, 05/18/2015); Cystoscopy with retrograde pyelogram, ureteroscopy and stent placement (Left, 06/22/2015); Cystoscopy w/ retrogrades (Bilateral, 06/22/2015); Abdominal hysterectomy; Diagnostic laparoscopy; Anterior and posterior repair with sacrospinous fixation (N/A, 08/16/2015); Bladder suspension (N/A, 08/16/2015); Cystoscopy (N/A, 08/16/2015); Vaginal hysterectomy (N/A, 08/18/2015);  Polypectomy; Colonoscopy (08/21/2017); Robotic assisted laparoscopic sacrocolpopexy (N/A, 12/12/2022); Cystoscopy (N/A, 12/12/2022); and Cystocele repair (N/A, 12/12/2022).   Medications: She has a current medication list which includes the following prescription(s): alprazolam, doxycycline, estradiol, [START ON 08/12/2023] estradiol, metronidazole, multivitamin with minerals, phentermine, pramipexole, and topiramate.   Allergies: Patient is allergic to desvenlafaxine, prochlorperazine, and iodinated contrast media.   Social History: Patient  reports that she quit smoking about 15 years ago. Her smoking use included cigarettes. She started smoking about 30 years ago. She has never used smokeless tobacco. She reports that she does not drink alcohol and does not use drugs.      OBJECTIVE     Physical Exam: Vitals:   08/09/23 1142 08/09/23 1143  BP: (!) 152/80 117/75  Pulse: 78 67   Gen: No apparent distress, A&O x 3.  Detailed Urogynecologic Evaluation:  Normal external genitalia. On speculum, normal vaginal mucosa. On bimanual, no masses present. Aptima swab obtained.Significant pelvic floor muscle spasm bilaterally.  POP-Q  -0.5                                            Aa   -0.5                                           Ba  -7                                              C   4  Gh  4                                            Pb  7.5                                            tvl   -3                                            Ap  -3                                            Bp                                                 D      ASSESSMENT AND PLAN    Ms. King is a 57 y.o. with:  1. Vaginal atrophy   2. Urinary frequency   3. Levator spasm   4. Prolapse of anterior vaginal wall   5. Leukocytes in urine    - For atrophy, restart estrace cream 0.5g nightly for two weeks then twice a week after.  - small  leukocytes on POC urine, will send culture - Aptima swab obtained to r/o vaginal infection - For pelvic floor muscle spasm, has tried valium in the past but caused some sedation. Has not tried pelvic PT but is open to it. Referral placed to [facility] rehab.  - She does have some recurrence of her anterior wall prolapse. We discussed that during the sacrocolpopexy, she did have some scarring which prevented placement of mesh all the way to the level of the urethra and an anterior repair was performed. Also had prior anterior repair with sling. Discussed option of repeat anterior repair with biologic or mesh graft. Would avoid vaginal mesh due to history of pelvic pain and muscle spasm.  - Will start with pelvic PT then have her return in 3 months to reassess symptoms.   Marguerita Beards, MD

## 2023-08-10 LAB — URINE CULTURE: Culture: NO GROWTH

## 2023-08-12 ENCOUNTER — Encounter: Payer: Self-pay | Admitting: Obstetrics and Gynecology

## 2023-08-12 LAB — CERVICOVAGINAL ANCILLARY ONLY
Bacterial Vaginitis (gardnerella): POSITIVE — AB
Candida Glabrata: NEGATIVE
Candida Vaginitis: POSITIVE — AB
Comment: NEGATIVE
Comment: NEGATIVE
Comment: NEGATIVE

## 2023-08-13 MED ORDER — METRONIDAZOLE 500 MG PO TABS
500.0000 mg | ORAL_TABLET | Freq: Two times a day (BID) | ORAL | 0 refills | Status: AC
Start: 2023-08-13 — End: 2023-08-20

## 2023-08-13 MED ORDER — FLUCONAZOLE 150 MG PO TABS: 150.0000 mg | ORAL_TABLET | Freq: Once | ORAL | 0 refills | Status: AC

## 2023-08-13 NOTE — Addendum Note (Signed)
Addended by: Marguerita Beards on: 08/13/2023 11:46 AM   Modules accepted: Orders

## 2023-08-15 ENCOUNTER — Ambulatory Visit (INDEPENDENT_AMBULATORY_CARE_PROVIDER_SITE_OTHER): Payer: 59 | Admitting: Psychology

## 2023-08-15 DIAGNOSIS — F411 Generalized anxiety disorder: Secondary | ICD-10-CM | POA: Diagnosis not present

## 2023-08-15 DIAGNOSIS — F330 Major depressive disorder, recurrent, mild: Secondary | ICD-10-CM

## 2023-08-15 NOTE — Progress Notes (Signed)
[facility] Counselor/Therapist Progress Note  Patient ID: [name], MRN: [id number],    Date: 08/15/23   Time Spent: 11:59am-12:52pm  Treatment Type: Individual Therapy  Pt is seen for a virtual video visit via caregility. Pt consents to virtual visit and is aware of limitations of virtual visits.  Pt joins from her home, reporting privacy, and counselor from her office.     Reported Symptoms: increased anxiety and some crying episodes in past week.  Pt reports worries w/ dad's recent health issues.    Mental Status Exam: Appearance:  Well Groomed     Behavior: Appropriate  Motor: Normal  Speech/Language:  Normal Rate  Affect: Appropriate  Mood: anxious  Thought process: normal  Thought content:   WNL  Sensory/Perceptual disturbances:   WNL  Orientation: oriented to person, place, time/date, and situation  Attention: Good  Concentration: Good  Memory: WNL  Fund of knowledge:  Good  Insight:   Good  Judgment:  Good  Impulse Control: Good   Risk Assessment: Danger to Self:  No Self-injurious Behavior: No Danger to Others: No Duty to Warn:no Physical Aggression / Violence:No  Access to Firearms a concern: No  Gang Involvement:No   Subjective: Counselor assessed pt current functioning per pt report.  Processed w/pt increased anxiety and tearfulness.  Assisted w/ identifying contributing factors and validating some worry and sadness w/ father's health.  Explored w/pt how expressing emotions and coping w/ time for mindful practices.   Pt affect wnl.  Pt reports some increased feeling anxious and crying episodes in past week.  Pt reported that dad in hospital recent and just transferred to rehab due to weakness- difficulty w/ mobility.  Pt expressed worry for his health and wants to visit.  Pt is able to acknowledge this is playing impact for her anxiety.  Pt identified some decrease stress in other areas and need for being intentional w/ her self  care/mindfulness practice for coping.   Interventions: Cognitive Behavioral Therapy, Mindfulness Meditation, and supportive  Diagnosis:Generalized anxiety disorder  Mild episode of recurrent major depressive disorder (HCC)  Plan: Pt to f/u w/ Counseling in 1-2 weeks.  Practice mindful/grounding breathe practice and daily movement.  Referred for psychiatrist.  F/u as scheduled w/ PCP.      Individualized Treatment Plan Strengths: seeking counseling, enjoys getting nails done, enjoys walking and movement, enjoys the beach, family Sunday dinners  Supports: her husband, her daughter in law and her mom    Goal/Needs for Treatment:  In order of importance to patient 1) continue to cope with anxiety 2) increase self worth      Client Statement of Needs: "focus on my self and continue building my self esteem and feel better about myself and confidence. Continue to maintain progress w/ coping w/ anxiety, being able to use mindfulness and deescalate.   07/11/23 "return to counseling to help get my anxiety under control again"    Treatment Level: outpatient counseling  Symptoms:anxiety, worry, ruminating thoughts, low self worth, emotional escalations, depressed mood, sleep disturbance  Client Treatment Preferences:Monthly counseling and continue medication management w/ PCP 07/11/23 increase to every 1-2 weeks, referral for psychiatrist to restart medication.    Healthcare consumer's goal for treatment:   Counselor, Forde Radon, [facility] will support the patient's ability to achieve the goals identified. Cognitive Behavioral Therapy, Assertive Communication/Conflict Resolution Training, Relaxation Training, ACT, Humanistic and other evidenced-based practices will be used to promote progress towards healthy functioning.    Healthcare consumer  will: Actively participate in therapy, working towards healthy functioning.     *Justification for Continuation/Discontinuation of Goal: R=Revised,  O=Ongoing, A=Achieved, D=Discontinued   Goal 1) Pt will increase coping with life's anxiety and stressors AEB decreased anxiety, decreased emotional escalations, decreased sleep disturbance, increased awareness of thought patterns that impact anxiety. Baseline date 03/04/23: Progress towards goal 30%; How Often - Daily Target Date Goal Was reviewed Status Code Progress towards goal  03/03/24  07/11/23  O  returning to counseling    03/03/24                   Goal 2)  Increase self awareness, identifying her likes/values/interest and increase feeling positive self worth w/ who she is as demonstrated by expressing and communicating in sessions.  Baseline date 03/04/23: Progress towards goal 25; How Often - Daily   Target Date Goal Was reviewed Status Code Progress towards goal/Likert rating  03/03/24  07/11/23  O  returning to counseling                      This plan has been reviewed and created by the following participants:  This plan will be reviewed at least every 12 months. Date Behavioral Health Clinician Date Guardian/Patient   03/04/23 [facility] [name] [facility]                      03/04/23 Verbal consent provided  07/11/23 [name] MH [name] [facility]     07/11/23  verbal consent              Forde Radon [facility]

## 2023-08-27 ENCOUNTER — Ambulatory Visit (INDEPENDENT_AMBULATORY_CARE_PROVIDER_SITE_OTHER): Payer: 59 | Admitting: Psychology

## 2023-08-27 DIAGNOSIS — F411 Generalized anxiety disorder: Secondary | ICD-10-CM | POA: Diagnosis not present

## 2023-08-27 DIAGNOSIS — F330 Major depressive disorder, recurrent, mild: Secondary | ICD-10-CM | POA: Diagnosis not present

## 2023-08-27 NOTE — Progress Notes (Signed)
[facility] Counselor/Therapist Progress Note  Patient ID: [name], MRN: [id number],    Date: 08/27/23   Time Spent: 12:00pm-12:54pm  Treatment Type: Individual Therapy  Pt is seen for a virtual video visit via caregility. Pt consents to virtual visit and is aware of limitations of virtual visits.  Pt joins from her home, reporting privacy, and counselor from her office.     Reported Symptoms: anxiety and sadness w/ dad's health and decline.     Mental Status Exam: Appearance:  Well Groomed     Behavior: Appropriate  Motor: Normal  Speech/Language:  Normal Rate  Affect: Appropriate  Mood: anxious and sad  Thought process: normal  Thought content:   WNL  Sensory/Perceptual disturbances:   WNL  Orientation: oriented to person, place, time/date, and situation  Attention: Good  Concentration: Good  Memory: WNL  Fund of knowledge:  Good  Insight:   Good  Judgment:  Good  Impulse Control: Good   Risk Assessment: Danger to Self:  No Self-injurious Behavior: No Danger to Others: No Duty to Warn:no Physical Aggression / Violence:No  Access to Firearms a concern: No  Gang Involvement:No   Subjective: Counselor assessed pt current functioning per pt report.  Processed w/pt  anxiety and sadness.  Validated and normalized feelings and acknowledged ways of expressing feelings.  Explored w/ pt coping skills and supports.  Assisted w/ reframing distortions. Pt affect wnl.  Pt reports she did go to visit dad at rehab in [place].  Pt reported she was glad she did but was shocked how much decline since last saw face to face 3 years ago.  Pt expressed feelings of sadness and worry for him.  Pt was able to reflect on norm to have these emotions.  Pt reported on support of husband.  Pt discussed hurt when dad didn't want to talk the other day and worried that mad at her.  Pt was able to reframe w/ husband's support and acknowledge that dad's actions not reflective of  their relationship but his difficulty w/ what he is experiencing.    Interventions: Cognitive Behavioral Therapy, Mindfulness Meditation, and supportive  Diagnosis:Generalized anxiety disorder  Mild episode of recurrent major depressive disorder (HCC)  Plan: Pt to f/u w/ Counseling in 1-2 weeks.  Practice mindful/grounding breathe practice and daily movement.  Referred for psychiatrist.  F/u as scheduled w/ PCP.      Individualized Treatment Plan Strengths: seeking counseling, enjoys getting nails done, enjoys walking and movement, enjoys the beach, family Sunday dinners  Supports: her husband, her daughter in law and her mom    Goal/Needs for Treatment:  In order of importance to patient 1) continue to cope with anxiety 2) increase self worth      Client Statement of Needs: "focus on my self and continue building my self esteem and feel better about myself and confidence. Continue to maintain progress w/ coping w/ anxiety, being able to use mindfulness and deescalate.   07/11/23 "return to counseling to help get my anxiety under control again"    Treatment Level: outpatient counseling  Symptoms:anxiety, worry, ruminating thoughts, low self worth, emotional escalations, depressed mood, sleep disturbance  Client Treatment Preferences:Monthly counseling and continue medication management w/ PCP 07/11/23 increase to every 1-2 weeks, referral for psychiatrist to restart medication.    Healthcare consumer's goal for treatment:   Counselor, Forde Radon, [facility] will support the patient's ability to achieve the goals identified. Cognitive Behavioral Therapy, Assertive Communication/Conflict Resolution Training,  Relaxation Training, ACT, Humanistic and other evidenced-based practices will be used to promote progress towards healthy functioning.    Healthcare consumer will: Actively participate in therapy, working towards healthy functioning.     *Justification for Continuation/Discontinuation  of Goal: R=Revised, O=Ongoing, A=Achieved, D=Discontinued   Goal 1) Pt will increase coping with life's anxiety and stressors AEB decreased anxiety, decreased emotional escalations, decreased sleep disturbance, increased awareness of thought patterns that impact anxiety. Baseline date 03/04/23: Progress towards goal 30%; How Often - Daily Target Date Goal Was reviewed Status Code Progress towards goal  03/03/24  07/11/23  O  returning to counseling    03/03/24                   Goal 2)  Increase self awareness, identifying her likes/values/interest and increase feeling positive self worth w/ who she is as demonstrated by expressing and communicating in sessions.  Baseline date 03/04/23: Progress towards goal 25; How Often - Daily   Target Date Goal Was reviewed Status Code Progress towards goal/Likert rating  03/03/24  07/11/23  O  returning to counseling                      This plan has been reviewed and created by the following participants:  This plan will be reviewed at least every 12 months. Date Behavioral Health Clinician Date Guardian/Patient   03/04/23 [facility] [name] [facility]                      03/04/23 Verbal consent provided  07/11/23 [name] MH [name] [facility]     07/11/23  verbal consent             Forde Radon [facility]

## 2023-09-04 ENCOUNTER — Encounter: Payer: Self-pay | Admitting: Family Medicine

## 2023-09-04 ENCOUNTER — Ambulatory Visit (INDEPENDENT_AMBULATORY_CARE_PROVIDER_SITE_OTHER): Payer: 59 | Admitting: Family Medicine

## 2023-09-04 VITALS — BP 128/80 | Temp 97.8°F | Ht 65.0 in | Wt 184.0 lb

## 2023-09-04 DIAGNOSIS — Z0000 Encounter for general adult medical examination without abnormal findings: Secondary | ICD-10-CM

## 2023-09-04 DIAGNOSIS — Z23 Encounter for immunization: Secondary | ICD-10-CM

## 2023-09-04 LAB — CBC WITH DIFFERENTIAL/PLATELET
Basophils Absolute: 0 10*3/uL (ref 0.0–0.1)
Basophils Relative: 0.5 % (ref 0.0–3.0)
Eosinophils Absolute: 0 10*3/uL (ref 0.0–0.7)
Eosinophils Relative: 0.9 % (ref 0.0–5.0)
HCT: 40.5 % (ref 36.0–46.0)
Hemoglobin: 13.2 g/dL (ref 12.0–15.0)
Lymphocytes Relative: 31.3 % (ref 12.0–46.0)
Lymphs Abs: 1.5 10*3/uL (ref 0.7–4.0)
MCHC: 32.6 g/dL (ref 30.0–36.0)
MCV: 87.5 fl (ref 78.0–100.0)
Monocytes Absolute: 0.5 10*3/uL (ref 0.1–1.0)
Monocytes Relative: 9.2 % (ref 3.0–12.0)
Neutro Abs: 2.9 10*3/uL (ref 1.4–7.7)
Neutrophils Relative %: 58.1 % (ref 43.0–77.0)
Platelets: 260 10*3/uL (ref 150.0–400.0)
RBC: 4.63 Mil/uL (ref 3.87–5.11)
RDW: 13.9 % (ref 11.5–15.5)
WBC: 4.9 10*3/uL (ref 4.0–10.5)

## 2023-09-04 LAB — LIPID PANEL
Cholesterol: 192 mg/dL (ref 0–200)
HDL: 58.6 mg/dL (ref >39.00)
LDL Cholesterol: 116 mg/dL — ABNORMAL HIGH (ref 0–99)
NonHDL: 133.41
Total CHOL/HDL Ratio: 3
Triglycerides: 85 mg/dL (ref 0.0–149.0)
VLDL: 17 mg/dL (ref 0.0–40.0)

## 2023-09-04 LAB — HEPATIC FUNCTION PANEL
ALT: 12 U/L (ref 0–35)
AST: 15 U/L (ref 0–37)
Albumin: 4 g/dL (ref 3.5–5.2)
Alkaline Phosphatase: 82 U/L (ref 39–117)
Bilirubin, Direct: 0.1 mg/dL (ref 0.0–0.3)
Total Bilirubin: 0.4 mg/dL (ref 0.2–1.2)
Total Protein: 7.1 g/dL (ref 6.0–8.3)

## 2023-09-04 LAB — HEMOGLOBIN A1C: Hgb A1c MFr Bld: 5.7 % (ref 4.6–6.5)

## 2023-09-04 LAB — BASIC METABOLIC PANEL
BUN: 15 mg/dL (ref 6–23)
CO2: 28 meq/L (ref 19–32)
Calcium: 9.4 mg/dL (ref 8.4–10.5)
Chloride: 108 meq/L (ref 96–112)
Creatinine, Ser: 0.82 mg/dL (ref 0.40–1.20)
GFR: 79.18 mL/min (ref >60.00)
Glucose, Bld: 95 mg/dL (ref 70–99)
Potassium: 4.5 meq/L (ref 3.5–5.1)
Sodium: 141 meq/L (ref 135–145)

## 2023-09-04 LAB — TSH: TSH: 3.74 u[IU]/mL (ref 0.35–5.50)

## 2023-09-04 MED ORDER — PRAMIPEXOLE DIHYDROCHLORIDE 1 MG PO TABS: 1.0000 mg | ORAL_TABLET | Freq: Two times a day (BID) | ORAL | 11 refills | Status: DC

## 2023-09-04 MED ORDER — TOPIRAMATE 50 MG PO TABS: 50.0000 mg | ORAL_TABLET | Freq: Two times a day (BID) | ORAL | 5 refills | Status: DC

## 2023-09-04 MED ORDER — PHENTERMINE HCL 37.5 MG PO CAPS: 37.5000 mg | ORAL_CAPSULE | ORAL | 5 refills | Status: DC

## 2023-09-04 NOTE — Addendum Note (Signed)
Addended by: Carola Rhine on: 09/04/2023 09:09 AM   Modules accepted: Orders

## 2023-09-04 NOTE — Progress Notes (Signed)
Subjective:    Patient ID: [name], female    DOB: [birth year], 57 y.o.   MRN: [id number]  HPI Here for a well exam. She feels well in general. She has been taking Phentermine 37.5 mg along with Topiramate 50 mg every morning to help suppress her appetite. This worked well at first, but now she finds herself getting very hungry in the afternoons and at night.    Review of Systems  Constitutional: Negative.   HENT: Negative.    Eyes: Negative.   Respiratory: Negative.    Cardiovascular: Negative.   Gastrointestinal: Negative.   Genitourinary:  Negative for decreased urine volume, difficulty urinating, dyspareunia, dysuria, enuresis, flank pain, frequency, hematuria, pelvic pain and urgency.  Musculoskeletal: Negative.   Skin: Negative.   Neurological: Negative.  Negative for headaches.  Psychiatric/Behavioral: Negative.         Objective:   Physical Exam Constitutional:      General: She is not in acute distress.    Appearance: She is well-developed. She is obese.  HENT:     Head: Normocephalic and atraumatic.     Right Ear: External ear normal.     Left Ear: External ear normal.     Nose: Nose normal.     Mouth/Throat:     Pharynx: No oropharyngeal exudate.  Eyes:     General: No scleral icterus.    Conjunctiva/sclera: Conjunctivae normal.     Pupils: Pupils are equal, round, and reactive to light.  Neck:     Thyroid: No thyromegaly.     Vascular: No JVD.  Cardiovascular:     Rate and Rhythm: Normal rate and regular rhythm.     Pulses: Normal pulses.     Heart sounds: Normal heart sounds. No murmur heard.    No friction rub. No gallop.  Pulmonary:     Effort: Pulmonary effort is normal. No respiratory distress.     Breath sounds: Normal breath sounds. No wheezing or rales.  Chest:     Chest wall: No tenderness.  Abdominal:     General: Bowel sounds are normal. There is no distension.     Palpations: Abdomen is soft. There is no mass.      Tenderness: There is no abdominal tenderness. There is no guarding or rebound.  Musculoskeletal:        General: No tenderness. Normal range of motion.     Cervical back: Normal range of motion and neck supple.  Lymphadenopathy:     Cervical: No cervical adenopathy.  Skin:    General: Skin is warm and dry.     Findings: No erythema or rash.  Neurological:     General: No focal deficit present.     Mental Status: She is alert and oriented to person, place, and time.     Cranial Nerves: No cranial nerve deficit.     Motor: No abnormal muscle tone.     Coordination: Coordination normal.     Deep Tendon Reflexes: Reflexes are normal and symmetric. Reflexes normal.  Psychiatric:        Mood and Affect: Mood normal.        Behavior: Behavior normal.        Thought Content: Thought content normal.        Judgment: Judgment normal.           Assessment & Plan:  Well exam. We discussed diet and exercise. Get fasting labs. For the weight loss, we will increase the Topiramate to  50 mg BID. Report back in 4 weeks.  Gershon Crane, MD

## 2023-09-10 ENCOUNTER — Ambulatory Visit (INDEPENDENT_AMBULATORY_CARE_PROVIDER_SITE_OTHER): Payer: 59 | Admitting: Psychology

## 2023-09-10 DIAGNOSIS — F411 Generalized anxiety disorder: Secondary | ICD-10-CM | POA: Diagnosis not present

## 2023-09-10 NOTE — Progress Notes (Signed)
[facility] Counselor/Therapist Progress Note  Patient ID: [name], MRN: [id number],    Date: 09/10/23   Time Spent: 12:00pm-12:45pm  Treatment Type: Individual Therapy  Pt is seen for a virtual video visit via caregility. Pt consents to virtual visit and is aware of limitations of virtual visits.  Pt joins from her car, reporting privacy, and counselor from her home office.     Reported Symptoms: grief following dad's death yesterday     Mental Status Exam: Appearance:  Well Groomed     Behavior: Appropriate  Motor: Normal  Speech/Language:  Normal Rate  Affect: Appropriate and Tearful  Mood: sad  Thought process: normal  Thought content:   WNL  Sensory/Perceptual disturbances:   WNL  Orientation: oriented to person, place, time/date, and situation  Attention: Good  Concentration: Good  Memory: WNL  Fund of knowledge:  Good  Insight:   Good  Judgment:  Good  Impulse Control: Good   Risk Assessment: Danger to Self:  No Self-injurious Behavior: No Danger to Others: No Duty to Warn:no Physical Aggression / Violence:No  Access to Firearms a concern: No  Gang Involvement:No   Subjective: Counselor assessed pt current functioning per pt report.  Processed w/pt  emotions and grief w/ loss of her father.  Validated and normalized feelings and supports she has around her. . Pt affect congruent w/ report of grief.  Pt tearful at times.  Pt reported her father died yesterday early morning and traveling to go to [place] today.  Pt reported that since last session were able to get further information for his care team re: dx that incurable and prognosis of 6 months of less to live.  Pt reports she was able to talk w/ him each day through last Friday and advocate for morphine for his pain relief.  Pt discussed waves of emotion at times ok and then very tearful sad.  Pt discussed support of her husband and being present to stepmom and other family this week.  Pt  was able to validate her emotions and good to express.    Interventions: Cognitive Behavioral Therapy, Mindfulness Meditation, and supportive  Diagnosis:Generalized anxiety disorder  Plan: Pt to f/u w/ Counseling in 1-2 weeks.  Practice mindful/grounding breathe practice and daily movement.  Referred for psychiatrist.  F/u as scheduled w/ PCP.      Individualized Treatment Plan Strengths: seeking counseling, enjoys getting nails done, enjoys walking and movement, enjoys the beach, family Sunday dinners  Supports: her husband, her daughter in law and her mom    Goal/Needs for Treatment:  In order of importance to patient 1) continue to cope with anxiety 2) increase self worth      Client Statement of Needs: "focus on my self and continue building my self esteem and feel better about myself and confidence. Continue to maintain progress w/ coping w/ anxiety, being able to use mindfulness and deescalate.   07/11/23 "return to counseling to help get my anxiety under control again"    Treatment Level: outpatient counseling  Symptoms:anxiety, worry, ruminating thoughts, low self worth, emotional escalations, depressed mood, sleep disturbance  Client Treatment Preferences:Monthly counseling and continue medication management w/ PCP 07/11/23 increase to every 1-2 weeks, referral for psychiatrist to restart medication.    Healthcare consumer's goal for treatment:   Counselor, Forde Radon, [facility] will support the patient's ability to achieve the goals identified. Cognitive Behavioral Therapy, Assertive Communication/Conflict Resolution Training, Relaxation Training, ACT, Humanistic and other evidenced-based practices  will be used to promote progress towards healthy functioning.    Healthcare consumer will: Actively participate in therapy, working towards healthy functioning.     *Justification for Continuation/Discontinuation of Goal: R=Revised, O=Ongoing, A=Achieved, D=Discontinued   Goal 1)  Pt will increase coping with life's anxiety and stressors AEB decreased anxiety, decreased emotional escalations, decreased sleep disturbance, increased awareness of thought patterns that impact anxiety. Baseline date 03/04/23: Progress towards goal 30%; How Often - Daily Target Date Goal Was reviewed Status Code Progress towards goal  03/03/24  07/11/23  O  returning to counseling    03/03/24                   Goal 2)  Increase self awareness, identifying her likes/values/interest and increase feeling positive self worth w/ who she is as demonstrated by expressing and communicating in sessions.  Baseline date 03/04/23: Progress towards goal 25; How Often - Daily   Target Date Goal Was reviewed Status Code Progress towards goal/Likert rating  03/03/24  07/11/23  O  returning to counseling                      This plan has been reviewed and created by the following participants:  This plan will be reviewed at least every 12 months. Date Behavioral Health Clinician Date Guardian/Patient   03/04/23 [facility] [name] [facility]                      03/04/23 Verbal consent provided  07/11/23 [name] MH [name] [facility]     07/11/23  verbal consent              Forde Radon [facility]

## 2023-09-16 ENCOUNTER — Ambulatory Visit (INDEPENDENT_AMBULATORY_CARE_PROVIDER_SITE_OTHER): Payer: 59 | Admitting: Psychology

## 2023-09-16 DIAGNOSIS — F330 Major depressive disorder, recurrent, mild: Secondary | ICD-10-CM | POA: Diagnosis not present

## 2023-09-16 DIAGNOSIS — F411 Generalized anxiety disorder: Secondary | ICD-10-CM | POA: Diagnosis not present

## 2023-09-16 DIAGNOSIS — F4321 Adjustment disorder with depressed mood: Secondary | ICD-10-CM | POA: Diagnosis not present

## 2023-09-16 NOTE — Progress Notes (Signed)
[facility] Counselor/Therapist Progress Note  Patient ID: [name], MRN: [id number],    Date: 09/16/23   Time Spent: 1:31pm-2:27pm  Treatment Type: Individual Therapy  Pt is seen for a virtual video visit via caregility. Pt consents to virtual visit and is aware of limitations of virtual visits.  Pt joins from her home, reporting privacy, and counselor from her home office.     Reported Symptoms: grief following dad' death, anxiety and tearfulness    Mental Status Exam: Appearance:  Well Groomed     Behavior: Appropriate  Motor: Normal  Speech/Language:  Normal Rate  Affect: Appropriate and Tearful  Mood: anxious and sad  Thought process: normal  Thought content:   WNL  Sensory/Perceptual disturbances:   WNL  Orientation: oriented to person, place, time/date, and situation  Attention: Good  Concentration: Good  Memory: WNL  Fund of knowledge:  Good  Insight:   Good  Judgment:  Good  Impulse Control: Good   Risk Assessment: Danger to Self:  No Self-injurious Behavior: No Danger to Others: No Duty to Warn:no Physical Aggression / Violence:No  Access to Firearms a concern: No  Gang Involvement:No   Subjective: Counselor assessed pt current functioning per pt report.  Processed w/pt  emotions and grief w/ loss of her father and his recent funeral.  Validated and normalized grief reaction and increased sadness and anxiety.  Explored interactions w/ others and engagement w/ things.  Encouraged self care steps and allow to express feelings. Pt affect congruent w/ grief.  Pt tearful at times.  Pt reported on visit to [place] for dad's funeral and interactions w/ family.  Pt discussed intense emotions of sadness and worry of getting through.  Pt increased awareness of feeling and not going to just "get over" and times of tearfulness that hit her.  Pt reported return to work tomorrow and allow self grace that not going to be normal.  Pt discussed checking  in on her step mom and worry for her.  Pt discussed support from her husband and her sons.    Interventions: Cognitive Behavioral Therapy, Mindfulness Meditation, and supportive  Diagnosis:Generalized anxiety disorder  Mild episode of recurrent major depressive disorder (HCC)  Grief  Plan: Pt to f/u w/ Counseling in 1-2 weeks.  Practice mindful/grounding breathe practice and daily movement.  Referred for psychiatrist.  F/u as scheduled w/ PCP.      Individualized Treatment Plan Strengths: seeking counseling, enjoys getting nails done, enjoys walking and movement, enjoys the beach, family Sunday dinners  Supports: her husband, her daughter in law and her mom    Goal/Needs for Treatment:  In order of importance to patient 1) continue to cope with anxiety 2) increase self worth      Client Statement of Needs: "focus on my self and continue building my self esteem and feel better about myself and confidence. Continue to maintain progress w/ coping w/ anxiety, being able to use mindfulness and deescalate.   07/11/23 "return to counseling to help get my anxiety under control again"    Treatment Level: outpatient counseling  Symptoms:anxiety, worry, ruminating thoughts, low self worth, emotional escalations, depressed mood, sleep disturbance  Client Treatment Preferences:Monthly counseling and continue medication management w/ PCP 07/11/23 increase to every 1-2 weeks, referral for psychiatrist to restart medication.    Healthcare consumer's goal for treatment:   Counselor, Forde Radon, [facility] will support the patient's ability to achieve the goals identified. Cognitive Behavioral Therapy, Assertive Communication/Conflict Resolution  Training, Management consultant, ACT, Humanistic and other evidenced-based practices will be used to promote progress towards healthy functioning.    Healthcare consumer will: Actively participate in therapy, working towards healthy functioning.      *Justification for Continuation/Discontinuation of Goal: R=Revised, O=Ongoing, A=Achieved, D=Discontinued   Goal 1) Pt will increase coping with life's anxiety and stressors AEB decreased anxiety, decreased emotional escalations, decreased sleep disturbance, increased awareness of thought patterns that impact anxiety. Baseline date 03/04/23: Progress towards goal 30%; How Often - Daily Target Date Goal Was reviewed Status Code Progress towards goal  03/03/24  07/11/23  O  returning to counseling    03/03/24                   Goal 2)  Increase self awareness, identifying her likes/values/interest and increase feeling positive self worth w/ who she is as demonstrated by expressing and communicating in sessions.  Baseline date 03/04/23: Progress towards goal 25; How Often - Daily   Target Date Goal Was reviewed Status Code Progress towards goal/Likert rating  03/03/24  07/11/23  O  returning to counseling                      This plan has been reviewed and created by the following participants:  This plan will be reviewed at least every 12 months. Date Behavioral Health Clinician Date Guardian/Patient   03/04/23 [facility] [name] [facility]                      03/04/23 Verbal consent provided  07/11/23 [name] MH [name] [facility]     07/11/23  verbal consent       Forde Radon [facility]

## 2023-09-17 ENCOUNTER — Encounter: Payer: Self-pay | Admitting: Family Medicine

## 2023-09-17 ENCOUNTER — Encounter: Payer: Self-pay | Admitting: Obstetrics and Gynecology

## 2023-09-17 NOTE — Telephone Encounter (Signed)
She can increase the Xanax to take 2 tablets together (total of 2 mg) BID as needed

## 2023-09-18 ENCOUNTER — Other Ambulatory Visit (HOSPITAL_COMMUNITY)
Admission: RE | Admit: 2023-09-18 | Discharge: 2023-09-18 | Disposition: A | Discharge status: Home / Self Care | Payer: 59 | Source: Ambulatory Visit | Attending: Obstetrics and Gynecology | Admitting: Obstetrics and Gynecology

## 2023-09-18 ENCOUNTER — Encounter: Payer: Self-pay | Admitting: Obstetrics and Gynecology

## 2023-09-18 ENCOUNTER — Ambulatory Visit (INDEPENDENT_AMBULATORY_CARE_PROVIDER_SITE_OTHER): Payer: 59 | Admitting: Obstetrics and Gynecology

## 2023-09-18 VITALS — BP 134/83 | HR 94

## 2023-09-18 DIAGNOSIS — N760 Acute vaginitis: Secondary | ICD-10-CM

## 2023-09-18 DIAGNOSIS — B9689 Other specified bacterial agents as the cause of diseases classified elsewhere: Secondary | ICD-10-CM | POA: Insufficient documentation

## 2023-09-18 DIAGNOSIS — R350 Frequency of micturition: Secondary | ICD-10-CM

## 2023-09-18 DIAGNOSIS — N898 Other specified noninflammatory disorders of vagina: Secondary | ICD-10-CM

## 2023-09-18 DIAGNOSIS — B379 Candidiasis, unspecified: Secondary | ICD-10-CM

## 2023-09-18 LAB — POCT URINALYSIS DIPSTICK
Bilirubin, UA: NEGATIVE
Glucose, UA: NEGATIVE
Ketones, UA: NEGATIVE
Leukocytes, UA: NEGATIVE
Nitrite, UA: NEGATIVE
Protein, UA: NEGATIVE
Spec Grav, UA: 1.015 (ref 1.010–1.025)
Urobilinogen, UA: 0.2 E.U./dL
pH, UA: 6 (ref 5.0–8.0)

## 2023-09-18 NOTE — Progress Notes (Signed)
[facility] Return Visit  SUBJECTIVE  History of Present Illness: [name] is a 57 y.o. female seen for vaginal irritation and pelvic pressure. She reports feeling fatigued and overall uncomfortably. Has previously had Yeast and BV on last swab.   She reports she was doing well for a while and then after intercourse started to have pain and discharge again.      Past Medical History: Patient  has a past medical history of Allergy, Anxiety, Depression, GERD (gastroesophageal reflux disease), History of abnormal cervical Pap smear, History of colon polyps, Hydronephrosis, left, Kidney stones (2016), PONV (postoperative nausea and vomiting), and Rosacea.   Past Surgical History: She  has a past surgical history that includes Laparoscopic cholecystectomy (1999); Tubal ligation (Bilateral, 1995); Cystoscopy w/ ureteral stent placement (01/1999); Extracorporeal shock wave lithotripsy (2001  &  2002); DX LAPAROSCOPY (X2); EXPLORATORY LAPAROTOMY W/ BILATERAL SALPINGECTOMY AND REMOVAL RIGHT ECTOPIC PREG. (02/23/2004); TOTAL ABDOMINAL HYSTERECTOMY W/ BILATERAL OOPHORECTOMY AND LYSIS ADHESIONS (09/02/2007); Esophagogastroduodenoscopy (05/09/2007); Umbilical hernia repair (2003); Shoulder arthroscopy with open rotator cuff repair (Right, 2012); Cystoscopy with ureteroscopy and stent placement (Left, 05/18/2015); Cystoscopy with holmium laser lithotripsy (Left, 05/18/2015); Cystoscopy with retrograde pyelogram, ureteroscopy and stent placement (Left, 06/22/2015); Cystoscopy w/ retrogrades (Bilateral, 06/22/2015); Abdominal hysterectomy; Diagnostic laparoscopy; Anterior and posterior repair with sacrospinous fixation (N/A, 08/16/2015); Bladder suspension (N/A, 08/16/2015); Cystoscopy (N/A, 08/16/2015); Vaginal hysterectomy (N/A, 08/18/2015); Polypectomy; Colonoscopy (08/21/2017); Robotic assisted laparoscopic sacrocolpopexy (N/A, 12/12/2022); Cystoscopy (N/A, 12/12/2022); and Cystocele  repair (N/A, 12/12/2022).   Medications: She has a current medication list which includes the following prescription(s): alprazolam, doxycycline, estradiol, multivitamin with minerals, phentermine, pramipexole, and topiramate.   Allergies: Patient is allergic to desvenlafaxine, prochlorperazine, and iodinated contrast media.   Social History: Patient  reports that she quit smoking about 15 years ago. Her smoking use included cigarettes. She started smoking about 30 years ago. She has never used smokeless tobacco. She reports that she does not drink alcohol and does not use drugs.      OBJECTIVE    Lab Results  Component Value Date   COLORU yellow 09/18/2023   CLARITYU clear 09/18/2023   GLUCOSEUR Negative 09/18/2023   BILIRUBINUR negative 09/18/2023   KETONESU Negative 09/18/2023   SPECGRAV 1.015 09/18/2023   RBCUR trace 09/18/2023   PHUR 6.0 09/18/2023   PROTEINUR Negative 09/18/2023   UROBILINOGEN 0.2 09/18/2023   LEUKOCYTESUR Negative 09/18/2023     Physical Exam: Vitals:   09/18/23 1544  BP: 134/83  Pulse: 94   Gen: No apparent distress, A&O x 3.  Detailed Urogynecologic Evaluation:  No obvious signs of yeast, but there is a greenish discharge vaginally suspicious for BV. No bleeding, mass or other concerning area.    ASSESSMENT AND PLAN    [name] is a 57 y.o. with:  1. Urinary frequency   2. Vaginal discharge    Patient reports some bladder pain and urinary frequency.POC urine negative for nitrites and leukocytes. Will send for culture to rule out infection.  Patient has vaginal discharge and irritation on exam. No obvious sign of yeast. Will start patient on vaginal suppositories 600mg  boric acid until culture returns.   Patient to return as needed. Will call her regarding urine and swab results.

## 2023-09-19 ENCOUNTER — Encounter: Payer: Self-pay | Admitting: Obstetrics and Gynecology

## 2023-09-19 ENCOUNTER — Other Ambulatory Visit (HOSPITAL_COMMUNITY)
Admission: RE | Admit: 2023-09-19 | Discharge: 2023-09-19 | Disposition: A | Discharge status: Home / Self Care | Payer: 59 | Source: Other Acute Inpatient Hospital | Attending: Obstetrics and Gynecology | Admitting: Obstetrics and Gynecology

## 2023-09-19 DIAGNOSIS — R350 Frequency of micturition: Secondary | ICD-10-CM | POA: Insufficient documentation

## 2023-09-19 DIAGNOSIS — R82998 Other abnormal findings in urine: Secondary | ICD-10-CM | POA: Diagnosis present

## 2023-09-19 LAB — CERVICOVAGINAL ANCILLARY ONLY
Bacterial Vaginitis (gardnerella): POSITIVE — AB
Candida Glabrata: NEGATIVE
Candida Vaginitis: POSITIVE — AB
Comment: NEGATIVE
Comment: NEGATIVE
Comment: NEGATIVE

## 2023-09-19 MED ORDER — METRONIDAZOLE 500 MG PO TABS
500.0000 mg | ORAL_TABLET | Freq: Two times a day (BID) | ORAL | 0 refills | Status: AC
Start: 2023-09-19 — End: 2023-09-26

## 2023-09-19 MED ORDER — FLUCONAZOLE 150 MG PO TABS
150.0000 mg | ORAL_TABLET | Freq: Every day | ORAL | 0 refills | Status: AC
Start: 2023-09-19 — End: 2023-09-21

## 2023-09-19 NOTE — Addendum Note (Signed)
Addended by: Selmer Dominion on: 09/19/2023 03:54 PM   Modules accepted: Orders

## 2023-09-20 LAB — URINE CULTURE: Culture: NO GROWTH

## 2023-09-24 ENCOUNTER — Ambulatory Visit: Payer: 59 | Admitting: Psychology

## 2023-10-01 ENCOUNTER — Ambulatory Visit: Payer: 59 | Admitting: Psychology

## 2023-10-01 DIAGNOSIS — F4321 Adjustment disorder with depressed mood: Secondary | ICD-10-CM

## 2023-10-01 DIAGNOSIS — F411 Generalized anxiety disorder: Secondary | ICD-10-CM | POA: Diagnosis not present

## 2023-10-01 DIAGNOSIS — F330 Major depressive disorder, recurrent, mild: Secondary | ICD-10-CM | POA: Diagnosis not present

## 2023-10-01 NOTE — Progress Notes (Signed)
[facility] Counselor/Therapist Progress Note  Patient ID: [name], MRN: [id number],    Date: 10/01/23   Time Spent: 8:00am-8:48am  Treatment Type: Individual Therapy  Pt is seen for a virtual video visit via caregility. Pt consents to virtual visit and is aware of limitations of virtual visits.  Pt joins from her home, reporting privacy, and counselor from her home office.     Reported Symptoms: continued anxiety,  recent escalation w/ husband, grief   Mental Status Exam: Appearance:  Well Groomed     Behavior: Appropriate  Motor: Normal  Speech/Language:  Normal Rate  Affect: Appropriate and Tearful  Mood: anxious and sad  Thought process: normal  Thought content:   WNL  Sensory/Perceptual disturbances:   WNL  Orientation: oriented to person, place, time/date, and situation  Attention: Good  Concentration: Good  Memory: WNL  Fund of knowledge:  Good  Insight:   Good  Judgment:  Good  Impulse Control: Good   Risk Assessment: Danger to Self:  No Self-injurious Behavior: No Danger to Others: No Duty to Warn:no Physical Aggression / Violence:No  Access to Firearms a concern: No  Gang Involvement:No   Subjective: Counselor assessed pt current functioning per pt report.  Processed w/pt  emotions and emotional escalations.  Explored triggers and assisted pt in identifying ways of grounding and daily soothing system.  Validated and normalized grief reaction and discussed ways of feeling connected.   Pt affect wnl.  Pt tearful at times.  Pt reported moments of increased anxiety- especially when not engaging w/ others or when less busy.  Pt reported that recent escalation w/ husband and then feeling guilty about following.  Pt recognized that need for increased self care and coping skills w/ increased emotional stressors recent.  Pt discussed benefit of mindful exercises.  Pt receptive to grounding practices and need for daily.  Pt reported on  reliving the week of funeral and playing over and over.  Pt discussed ways of feeing connected w/ dad.    Interventions: Cognitive Behavioral Therapy, Mindfulness Meditation, and supportive  Diagnosis:Generalized anxiety disorder  Mild episode of recurrent major depressive disorder (HCC)  Grief  Plan: Pt to f/u w/ Counseling in 1-2 weeks.  Practice mindful/grounding breathe practice and daily movement.  Referred for psychiatrist.  F/u as scheduled w/ PCP.      Individualized Treatment Plan Strengths: seeking counseling, enjoys getting nails done, enjoys walking and movement, enjoys the beach, family Sunday dinners  Supports: her husband, her daughter in law and her mom    Goal/Needs for Treatment:  In order of importance to patient 1) continue to cope with anxiety 2) increase self worth      Client Statement of Needs: "focus on my self and continue building my self esteem and feel better about myself and confidence. Continue to maintain progress w/ coping w/ anxiety, being able to use mindfulness and deescalate.   07/11/23 "return to counseling to help get my anxiety under control again"    Treatment Level: outpatient counseling  Symptoms:anxiety, worry, ruminating thoughts, low self worth, emotional escalations, depressed mood, sleep disturbance  Client Treatment Preferences:Monthly counseling and continue medication management w/ PCP 07/11/23 increase to every 1-2 weeks, referral for psychiatrist to restart medication.    Healthcare consumer's goal for treatment:   Counselor, Forde Radon, [facility] will support the patient's ability to achieve the goals identified. Cognitive Behavioral Therapy, Assertive Communication/Conflict Resolution Training, Relaxation Training, ACT, Humanistic and other evidenced-based practices  will be used to promote progress towards healthy functioning.    Healthcare consumer will: Actively participate in therapy, working towards healthy functioning.      *Justification for Continuation/Discontinuation of Goal: R=Revised, O=Ongoing, A=Achieved, D=Discontinued   Goal 1) Pt will increase coping with life's anxiety and stressors AEB decreased anxiety, decreased emotional escalations, decreased sleep disturbance, increased awareness of thought patterns that impact anxiety. Baseline date 03/04/23: Progress towards goal 30%; How Often - Daily Target Date Goal Was reviewed Status Code Progress towards goal  03/03/24  07/11/23  O  returning to counseling    03/03/24                   Goal 2)  Increase self awareness, identifying her likes/values/interest and increase feeling positive self worth w/ who she is as demonstrated by expressing and communicating in sessions.  Baseline date 03/04/23: Progress towards goal 25; How Often - Daily   Target Date Goal Was reviewed Status Code Progress towards goal/Likert rating  03/03/24  07/11/23  O  returning to counseling                      This plan has been reviewed and created by the following participants:  This plan will be reviewed at least every 12 months. Date Behavioral Health Clinician Date Guardian/Patient   03/04/23 [facility] [name]                      03/04/23 Verbal consent provided  07/11/23 [name]     07/11/23  verbal consent                Forde Radon [facility]

## 2023-10-08 ENCOUNTER — Ambulatory Visit (INDEPENDENT_AMBULATORY_CARE_PROVIDER_SITE_OTHER): Payer: 59 | Admitting: Psychology

## 2023-10-08 DIAGNOSIS — F411 Generalized anxiety disorder: Secondary | ICD-10-CM

## 2023-10-08 DIAGNOSIS — F4321 Adjustment disorder with depressed mood: Secondary | ICD-10-CM

## 2023-10-08 DIAGNOSIS — F330 Major depressive disorder, recurrent, mild: Secondary | ICD-10-CM | POA: Diagnosis not present

## 2023-10-08 NOTE — Progress Notes (Signed)
[facility] Counselor/Therapist Progress Note  Patient ID: [name], MRN: [id number],    Date: 10/08/23   Time Spent: 12:03pm-12:54pm  Treatment Type: Individual Therapy  Pt is seen for a virtual video visit via caregility. Pt consents to virtual visit and is aware of limitations of virtual visits.  Pt joins from her home, reporting privacy, and counselor from her home office.     Reported Symptoms: grief, some moments of anxiety/feeling overwhelmed- using grounding techniques   Mental Status Exam: Appearance:  Well Groomed     Behavior: Appropriate  Motor: Normal  Speech/Language:  Normal Rate  Affect: Appropriate and Tearful  Mood: anxious and sad  Thought process: normal  Thought content:   WNL  Sensory/Perceptual disturbances:   WNL  Orientation: oriented to person, place, time/date, and situation  Attention: Good  Concentration: Good  Memory: WNL  Fund of knowledge:  Good  Insight:   Good  Judgment:  Good  Impulse Control: Good   Risk Assessment: Danger to Self:  No Self-injurious Behavior: No Danger to Others: No Duty to Warn:no Physical Aggression / Violence:No  Access to Firearms a concern: No  Gang Involvement:No   Subjective: Counselor assessed pt current functioning per pt report.  Processed w/pt  emotions and grief. Assisted challenging distortions related to guilt.  Explored use of grounding and positive outcomes reflected. Discussed ways to practice mindfulness skills daily to assist w/ feeling present.  Pt affect wnl.  Pt tearful at times.  Pt reports that has been experiencing times when hit by sadness/tearful, times when very anxious and other time no anxiety or sadness present.  Pt discussed guilt when not having grief feelings or when feeling that left dad.  Pt is able to challenge w/ facts and reframe those thoughts.  Pt reports she is being aware of using grounding technique discussed and that has been helpful.  Pt talked  about how when visiting for funeral although emotional also able ot be present and sense of present.  Pt discussed how gets overwhelmed by dad to day and responsibilities and that would be nice to have some of that presence.      Interventions: Cognitive Behavioral Therapy, Mindfulness Meditation, and supportive  Diagnosis:Generalized anxiety disorder  Grief  Mild episode of recurrent major depressive disorder (HCC)  Plan: Pt to f/u w/ Counseling in 1-2 weeks.  Practice mindful/grounding breathe practice and daily movement.  Referred for psychiatrist.  F/u as scheduled w/ PCP.      Individualized Treatment Plan Strengths: seeking counseling, enjoys getting nails done, enjoys walking and movement, enjoys the beach, family Sunday dinners  Supports: her husband, her daughter in law and her mom    Goal/Needs for Treatment:  In order of importance to patient 1) continue to cope with anxiety 2) increase self worth      Client Statement of Needs: "focus on my self and continue building my self esteem and feel better about myself and confidence. Continue to maintain progress w/ coping w/ anxiety, being able to use mindfulness and deescalate.   07/11/23 "return to counseling to help get my anxiety under control again"    Treatment Level: outpatient counseling  Symptoms:anxiety, worry, ruminating thoughts, low self worth, emotional escalations, depressed mood, sleep disturbance  Client Treatment Preferences:Monthly counseling and continue medication management w/ PCP 07/11/23 increase to every 1-2 weeks, referral for psychiatrist to restart medication.    Healthcare consumer's goal for treatment:   Counselor, Forde Radon, [facility] will  support the patient's ability to achieve the goals identified. Cognitive Behavioral Therapy, Assertive Communication/Conflict Resolution Training, Relaxation Training, ACT, Humanistic and other evidenced-based practices will be used to promote progress towards  healthy functioning.    Healthcare consumer will: Actively participate in therapy, working towards healthy functioning.     *Justification for Continuation/Discontinuation of Goal: R=Revised, O=Ongoing, A=Achieved, D=Discontinued   Goal 1) Pt will increase coping with life's anxiety and stressors AEB decreased anxiety, decreased emotional escalations, decreased sleep disturbance, increased awareness of thought patterns that impact anxiety. Baseline date 03/04/23: Progress towards goal 30%; How Often - Daily Target Date Goal Was reviewed Status Code Progress towards goal  03/03/24  07/11/23  O  returning to counseling    03/03/24                   Goal 2)  Increase self awareness, identifying her likes/values/interest and increase feeling positive self worth w/ who she is as demonstrated by expressing and communicating in sessions.  Baseline date 03/04/23: Progress towards goal 25; How Often - Daily   Target Date Goal Was reviewed Status Code Progress towards goal/Likert rating  03/03/24  07/11/23  O  returning to counseling                      This plan has been reviewed and created by the following participants:  This plan will be reviewed at least every 12 months. Date Behavioral Health Clinician Date Guardian/Patient   03/04/23 [name] [facility]                      03/04/23 Verbal consent provided  07/11/23 [name] MH [name]     07/11/23  verbal consent               Forde Radon [facility]

## 2023-10-14 ENCOUNTER — Encounter: Payer: Self-pay | Admitting: Gastroenterology

## 2023-10-14 ENCOUNTER — Encounter: Payer: Self-pay | Admitting: Family Medicine

## 2023-10-14 NOTE — Telephone Encounter (Signed)
Have her contact the insurance company and ask them what specific steps would we need (or document) to take in order to get this covered

## 2023-10-15 ENCOUNTER — Ambulatory Visit: Payer: 59 | Admitting: Family Medicine

## 2023-10-16 ENCOUNTER — Ambulatory Visit: Payer: 59 | Admitting: Family Medicine

## 2023-10-16 ENCOUNTER — Encounter: Payer: Self-pay | Admitting: Family Medicine

## 2023-10-16 VITALS — BP 120/80 | HR 65 | Temp 97.5°F | Wt 186.0 lb

## 2023-10-16 DIAGNOSIS — E669 Obesity, unspecified: Secondary | ICD-10-CM

## 2023-10-16 DIAGNOSIS — R1010 Upper abdominal pain, unspecified: Secondary | ICD-10-CM | POA: Diagnosis not present

## 2023-10-16 MED ORDER — OMEPRAZOLE 40 MG PO CPDR: 40.0000 mg | DELAYED_RELEASE_CAPSULE | Freq: Every day | ORAL | 5 refills | Status: DC

## 2023-10-16 NOTE — Progress Notes (Signed)
   Subjective:    Patient ID: [name], female    DOB: Aug 31, 1966, 57 y.o.   MRN: [id number]  HPI Here for 6 months of intermittent upper abdominal pain and nausea without vomiting. No fever. Sometimes eating makes this worse but not always. She tends to be constipated but this is managed by taking Miralax. She is S/P a cholecystectomy.    Review of Systems  Constitutional: Negative.   Respiratory: Negative.    Cardiovascular: Negative.   Gastrointestinal:  Positive for abdominal pain, constipation and nausea. Negative for abdominal distention, blood in stool, diarrhea and vomiting.  Genitourinary: Negative.        Objective:   Physical Exam Constitutional:      Appearance: Normal appearance. She is not ill-appearing.  Cardiovascular:     Rate and Rhythm: Normal rate and regular rhythm.     Pulses: Normal pulses.     Heart sounds: Normal heart sounds.  Pulmonary:     Effort: Pulmonary effort is normal.     Breath sounds: Normal breath sounds.  Abdominal:     General: Abdomen is flat. Bowel sounds are normal. There is no distension.     Palpations: Abdomen is soft. There is no mass.     Tenderness: There is no guarding or rebound.     Hernia: No hernia is present.     Comments: Mildly tender in the epigastrium and LUQ  Neurological:     Mental Status: She is alert.           Assessment & Plan:  Upper abdominal pain, possibly due to gastritis or duodenitis. Treat with Omeprazole 40 mg every morning. Also set up an abdominal US. Gershon Crane, MD

## 2023-10-16 NOTE — Telephone Encounter (Signed)
Pt had a visit today with Dr Clent Ridges regarding this

## 2023-10-17 ENCOUNTER — Ambulatory Visit
Admission: RE | Admit: 2023-10-17 | Discharge: 2023-10-17 | Disposition: A | Discharge status: Home / Self Care | Payer: 59 | Source: Ambulatory Visit | Attending: Family Medicine | Admitting: Family Medicine

## 2023-10-17 DIAGNOSIS — R1010 Upper abdominal pain, unspecified: Secondary | ICD-10-CM

## 2023-10-21 ENCOUNTER — Encounter (INDEPENDENT_AMBULATORY_CARE_PROVIDER_SITE_OTHER): Payer: 59 | Admitting: Family Medicine

## 2023-10-21 ENCOUNTER — Encounter (INDEPENDENT_AMBULATORY_CARE_PROVIDER_SITE_OTHER): Payer: Self-pay | Admitting: Family Medicine

## 2023-10-21 ENCOUNTER — Encounter (INDEPENDENT_AMBULATORY_CARE_PROVIDER_SITE_OTHER): Payer: Self-pay

## 2023-10-22 ENCOUNTER — Ambulatory Visit (INDEPENDENT_AMBULATORY_CARE_PROVIDER_SITE_OTHER): Payer: 59 | Admitting: Psychology

## 2023-10-22 DIAGNOSIS — F411 Generalized anxiety disorder: Secondary | ICD-10-CM

## 2023-10-22 DIAGNOSIS — F4321 Adjustment disorder with depressed mood: Secondary | ICD-10-CM | POA: Diagnosis not present

## 2023-10-22 NOTE — Progress Notes (Signed)
[facility] Counselor/Therapist Progress Note  Patient ID: [name], MRN: [id number],    Date: 10/22/23   Time Spent: 12:00pm-12:54pm  Treatment Type: Individual Therapy  Pt is seen for a virtual video visit via caregility. Pt consents to virtual visit and is aware of limitations of virtual visits.  Pt joins from her home, reporting privacy, and counselor from her home office.     Reported Symptoms: grief, some anxiety w/ job and home/work balance.  Mental Status Exam: Appearance:  Well Groomed     Behavior: Appropriate  Motor: Normal  Speech/Language:  Normal Rate  Affect: Appropriate and Tearful  Mood: anxious and sad  Thought process: normal  Thought content:   WNL  Sensory/Perceptual disturbances:   WNL  Orientation: oriented to person, place, time/date, and situation  Attention: Good  Concentration: Good  Memory: WNL  Fund of knowledge:  Good  Insight:   Good  Judgment:  Good  Impulse Control: Good   Risk Assessment: Danger to Self:  No Self-injurious Behavior: No Danger to Others: No Duty to Warn:no Physical Aggression / Violence:No  Access to Firearms a concern: No  Gang Involvement:No   Subjective: Counselor assessed pt current functioning per pt report.  Processed w/pt  positives and stressors.  Validated and normalized moments of grief/triggers.  Explore stress of job write up and pt worry of balance of work/home.  Discussed positives of self care and supports and continued steps w/ distress tolerance skills.  Pt affect wnl.  Pt tearful at times.  Pt reports on overall decreased anxious and on edge feelings. Pt reports grief that triggered and tearfulness.  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  Pt discussed work/life balance and impact of grief as well. Pt informed travel to visit w/ stepmom and help her out over weekend.  Pt reported practicing grounding skills.  Pt also reports focused  on increase movement/exercise.         Interventions: Cognitive Behavioral Therapy, Mindfulness Meditation, and supportive  Diagnosis:Generalized anxiety disorder  Grief  Plan: Pt to f/u w/ Counseling in 2 weeks.  Practice mindful/grounding breathe practice and daily movement.  Referred for psychiatrist.  F/u as scheduled w/ PCP.      Individualized Treatment Plan Strengths: seeking counseling, enjoys getting nails done, enjoys walking and movement, enjoys the beach, family Sunday dinners  Supports: her husband, her daughter in law and her mom    Goal/Needs for Treatment:  In order of importance to patient 1) continue to cope with anxiety 2) increase self worth      Client Statement of Needs: "focus on my self and continue building my self esteem and feel better about myself and confidence. Continue to maintain progress w/ coping w/ anxiety, being able to use mindfulness and deescalate.   07/11/23 "return to counseling to help get my anxiety under control again"    Treatment Level: outpatient counseling  Symptoms:anxiety, worry, ruminating thoughts, low self worth, emotional escalations, depressed mood, sleep disturbance  Client Treatment Preferences:Monthly counseling and continue medication management w/ PCP 07/11/23 increase to every 1-2 weeks, referral for psychiatrist to restart medication.    Healthcare consumer's goal for treatment:   Counselor, Forde Radon, [facility] will support the patient's ability to achieve the goals identified. Cognitive Behavioral Therapy, Assertive Communication/Conflict Resolution Training, Relaxation Training, ACT, Humanistic and other evidenced-based practices will be used to promote progress towards healthy functioning.    Healthcare consumer will:  Actively participate in therapy, working towards healthy functioning.     *Justification for Continuation/Discontinuation of Goal: R=Revised, O=Ongoing, A=Achieved, D=Discontinued   Goal 1) Pt will  increase coping with life's anxiety and stressors AEB decreased anxiety, decreased emotional escalations, decreased sleep disturbance, increased awareness of thought patterns that impact anxiety. Baseline date 03/04/23: Progress towards goal 30%; How Often - Daily Target Date Goal Was reviewed Status Code Progress towards goal  03/03/24  07/11/23  O  returning to counseling    03/03/24                   Goal 2)  Increase self awareness, identifying her likes/values/interest and increase feeling positive self worth w/ who she is as demonstrated by expressing and communicating in sessions.  Baseline date 03/04/23: Progress towards goal 25; How Often - Daily   Target Date Goal Was reviewed Status Code Progress towards goal/Likert rating  03/03/24  07/11/23  O  returning to counseling                      This plan has been reviewed and created by the following participants:  This plan will be reviewed at least every 12 months. Date Behavioral Health Clinician Date Guardian/Patient   03/04/23 [facility] [name] [facility]                      03/04/23 Verbal consent provided  07/11/23 [name] MH [name] [facility]     07/11/23  verbal consent              Forde Radon [facility]

## 2023-10-24 ENCOUNTER — Encounter (INDEPENDENT_AMBULATORY_CARE_PROVIDER_SITE_OTHER): Payer: No Typology Code available for payment source | Admitting: Family Medicine

## 2023-11-04 ENCOUNTER — Other Ambulatory Visit: Payer: Self-pay | Admitting: Family Medicine

## 2023-11-05 ENCOUNTER — Ambulatory Visit: Payer: 59 | Admitting: Psychology

## 2023-11-06 ENCOUNTER — Ambulatory Visit: Payer: 59 | Admitting: Obstetrics and Gynecology

## 2023-11-06 NOTE — Progress Notes (Deleted)
[facility] Return Visit  SUBJECTIVE  History of Present Illness: [name] is a 57 y.o. female seen in follow-up. Surgery: s/p Robotic assisted sacrocolpopexy, lysis of adhesions, anterior repair, cystoscopy on 12/13/23  She has felt a bulge for about a month or so- can feel at the opening. Has a lot of vaginal dryness and discomfort with intercourse. She uses coconut oil for lubricant but not on a daily basis. Noticed some vaginal burning. Also has pelvic cramping intermittently. Denies bladder leakage.   Past Medical History: Patient  has a past medical history of Allergy, Anxiety, Depression, GERD (gastroesophageal reflux disease), History of abnormal cervical Pap smear, History of colon polyps, Hydronephrosis, left, Kidney stones (2016), PONV (postoperative nausea and vomiting), and Rosacea.   Past Surgical History: She  has a past surgical history that includes Laparoscopic cholecystectomy (1999); Tubal ligation (Bilateral, 1995); Cystoscopy w/ ureteral stent placement (01/1999); Extracorporeal shock wave lithotripsy (2001  &  2002); DX LAPAROSCOPY (X2); EXPLORATORY LAPAROTOMY W/ BILATERAL SALPINGECTOMY AND REMOVAL RIGHT ECTOPIC PREG. (02/23/2004); TOTAL ABDOMINAL HYSTERECTOMY W/ BILATERAL OOPHORECTOMY AND LYSIS ADHESIONS (09/02/2007); Esophagogastroduodenoscopy (05/09/2007); Umbilical hernia repair (2003); Shoulder arthroscopy with open rotator cuff repair (Right, 2012); Cystoscopy with ureteroscopy and stent placement (Left, 05/18/2015); Cystoscopy with holmium laser lithotripsy (Left, 05/18/2015); Cystoscopy with retrograde pyelogram, ureteroscopy and stent placement (Left, 06/22/2015); Cystoscopy w/ retrogrades (Bilateral, 06/22/2015); Abdominal hysterectomy; Diagnostic laparoscopy; Anterior and posterior repair with sacrospinous fixation (N/A, 08/16/2015); Bladder suspension (N/A, 08/16/2015); Cystoscopy (N/A, 08/16/2015); Vaginal hysterectomy (N/A, 08/18/2015);  Polypectomy; Colonoscopy (08/21/2017); Robotic assisted laparoscopic sacrocolpopexy (N/A, 12/12/2022); Cystoscopy (N/A, 12/12/2022); Cystocele repair (N/A, 12/12/2022); and Cholecystectomy (1998).   Medications: She has a current medication list which includes the following prescription(s): alprazolam, doxycycline, estradiol, multivitamin with minerals, omeprazole, phentermine, pramipexole, and topiramate.   Allergies: Patient is allergic to desvenlafaxine, prochlorperazine, and iodinated contrast media.   Social History: Patient  reports that she quit smoking about 15 years ago. Her smoking use included cigarettes. She started smoking about 30 years ago. She has never used smokeless tobacco. She reports that she does not drink alcohol and does not use drugs.      OBJECTIVE     Physical Exam: There were no vitals filed for this visit.  Gen: No apparent distress, A&O x 3.  Detailed Urogynecologic Evaluation:  Normal external genitalia. On speculum, normal vaginal mucosa. On bimanual, no masses present. Aptima swab obtained.Significant pelvic floor muscle spasm bilaterally.  POP-Q                                               Aa                                               Ba                                                 C  Gh                                               Pb                                               tvl                                                Ap                                               Bp                                                 D      ASSESSMENT AND PLAN    Ms. Mushinski is a 57 y.o. with:  No diagnosis found.  - For atrophy, restart estrace cream 0.5g nightly for two weeks then twice a week after.  - small leukocytes on POC urine, will send culture - Aptima swab obtained to r/o vaginal infection - For pelvic floor muscle spasm, has tried valium in the past but caused some  sedation. Has not tried pelvic PT but is open to it. Referral placed to [facility] rehab.  - She does have some recurrence of her anterior wall prolapse. We discussed that during the sacrocolpopexy, she did have some scarring which prevented placement of mesh all the way to the level of the urethra and an anterior repair was performed. Also had prior anterior repair with sling. Discussed option of repeat anterior repair with biologic or mesh graft. Would avoid vaginal mesh due to history of pelvic pain and muscle spasm.  - Will start with pelvic PT then have her return in 3 months to reassess symptoms.   Marguerita Beards, MD

## 2023-11-13 ENCOUNTER — Ambulatory Visit: Payer: 59 | Admitting: Psychology

## 2023-11-13 DIAGNOSIS — F411 Generalized anxiety disorder: Secondary | ICD-10-CM | POA: Diagnosis not present

## 2023-11-13 DIAGNOSIS — F4321 Adjustment disorder with depressed mood: Secondary | ICD-10-CM | POA: Diagnosis not present

## 2023-11-13 NOTE — Progress Notes (Signed)
[facility] Counselor/Therapist Progress Note  Patient ID: [name], MRN: [id number],    Date: 11/13/23   Time Spent: 8:09am-8:58am  Treatment Type: Individual Therapy  Pt is seen for a virtual video visit via caregility. Pt consents to virtual visit and is aware of limitations of virtual visits.  Pt joins from her home, reporting privacy, and counselor from her home office.     Reported Symptoms: grief, conflict w/ husband and anxiety escalation during  Mental Status Exam: Appearance:  Well Groomed     Behavior: Appropriate  Motor: Normal  Speech/Language:  Normal Rate  Affect: Appropriate  Mood: anxious and sad  Thought process: normal  Thought content:   WNL  Sensory/Perceptual disturbances:   WNL  Orientation: oriented to person, place, time/date, and situation  Attention: Good  Concentration: Good  Memory: WNL  Fund of knowledge:  Good  Insight:   Good  Judgment:  Good  Impulse Control: Good   Risk Assessment: Danger to Self:  No Self-injurious Behavior: No Danger to Others: No Duty to Warn:no Physical Aggression / Violence:No  Access to Firearms a concern: No  Gang Involvement:No   Subjective: Counselor assessed pt current functioning per pt report.  Processed w/pt  recent emotional escalation.  Explored contributing factors and anxiety related to fear of being left/abandoned.  Assisted pt in recognizing how connected to past trauma/stressors and ways that gets triggered in disagreements w/ husband.  Assisted w/ in recognizing distortion and ways ot verbal challenge for self.  Pt affect wnl.  Pt had to cancel appointment last wek due to headache.  Pt offered this appointment to work in as requested.  Pt reports she had a good visit to [place] and was able to assist step mom w/ home/cleaning out.  Pt discussed moments of grief and memories of times from childhood.  Pt reported couple days ago- disagreement w/ husband and pt escalation of  anxiety.  Pt discussed feeling out of control with and then regretting reaction later.  Pt discussed anxiety that emerges and fear of abandonment that comes.  Pt is able to recognized how past getting triggered and distortions current safety/security.  Pt is able to challenge and refame and identify ways to practice and verbalize.    Interventions: Cognitive Behavioral Therapy, Mindfulness Meditation, and supportive  Diagnosis:Generalized anxiety disorder  Grief  Plan: Pt to f/u w/ Counseling in 2 weeks.  Practice mindful/grounding breathe practice and daily movement.  Referred for psychiatrist.  F/u as scheduled w/ PCP.      Individualized Treatment Plan Strengths: seeking counseling, enjoys getting nails done, enjoys walking and movement, enjoys the beach, family Sunday dinners  Supports: her husband, her daughter in law and her mom    Goal/Needs for Treatment:  In order of importance to patient 1) continue to cope with anxiety 2) increase self worth      Client Statement of Needs: "focus on my self and continue building my self esteem and feel better about myself and confidence. Continue to maintain progress w/ coping w/ anxiety, being able to use mindfulness and deescalate.   07/11/23 "return to counseling to help get my anxiety under control again"    Treatment Level: outpatient counseling  Symptoms:anxiety, worry, ruminating thoughts, low self worth, emotional escalations, depressed mood, sleep disturbance  Client Treatment Preferences:Monthly counseling and continue medication management w/ PCP 07/11/23 increase to every 1-2 weeks, referral for psychiatrist to restart medication.    Healthcare consumer's goal for treatment:  Counselor, Forde Radon, [facility] will support the patient's ability to achieve the goals identified. Cognitive Behavioral Therapy, Assertive Communication/Conflict Resolution Training, Relaxation Training, ACT, Humanistic and other evidenced-based practices  will be used to promote progress towards healthy functioning.    Healthcare consumer will: Actively participate in therapy, working towards healthy functioning.     *Justification for Continuation/Discontinuation of Goal: R=Revised, O=Ongoing, A=Achieved, D=Discontinued   Goal 1) Pt will increase coping with life's anxiety and stressors AEB decreased anxiety, decreased emotional escalations, decreased sleep disturbance, increased awareness of thought patterns that impact anxiety. Baseline date 03/04/23: Progress towards goal 30%; How Often - Daily Target Date Goal Was reviewed Status Code Progress towards goal  03/03/24  07/11/23  O  returning to counseling    03/03/24                   Goal 2)  Increase self awareness, identifying her likes/values/interest and increase feeling positive self worth w/ who she is as demonstrated by expressing and communicating in sessions.  Baseline date 03/04/23: Progress towards goal 25; How Often - Daily   Target Date Goal Was reviewed Status Code Progress towards goal/Likert rating  03/03/24  07/11/23  O  returning to counseling                      This plan has been reviewed and created by the following participants:  This plan will be reviewed at least every 12 months. Date Behavioral Health Clinician Date Guardian/Patient   03/04/23 [facility] [name]                      03/04/23 Verbal consent provided  07/11/23 [name] MH [name]     07/11/23  verbal consent           Forde Radon [facility]

## 2023-11-26 ENCOUNTER — Ambulatory Visit (INDEPENDENT_AMBULATORY_CARE_PROVIDER_SITE_OTHER): Payer: 59 | Admitting: Psychology

## 2023-11-26 DIAGNOSIS — F411 Generalized anxiety disorder: Secondary | ICD-10-CM

## 2023-11-26 DIAGNOSIS — F4321 Adjustment disorder with depressed mood: Secondary | ICD-10-CM

## 2023-11-26 NOTE — Progress Notes (Signed)
[facility] Counselor/Therapist Progress Note  Patient ID: [name], MRN: [id number],    Date: 11/26/23   Time Spent: 12:01pm-12:48pm  Treatment Type: Individual Therapy  Pt is seen for a virtual video visit via caregility. Pt consents to virtual visit and is aware of limitations of virtual visits.  Pt joins from her home, reporting privacy, and counselor from her home office.     Reported Symptoms: improved interaction/communication w/ husband.  Decreased anxiety.  Mental Status Exam: Appearance:  Well Groomed     Behavior: Appropriate  Motor: Normal  Speech/Language:  Normal Rate  Affect: Appropriate  Mood: sad  Thought process: normal  Thought content:   WNL  Sensory/Perceptual disturbances:   WNL  Orientation: oriented to person, place, time/date, and situation  Attention: Good  Concentration: Good  Memory: WNL  Fund of knowledge:  Good  Insight:   Good  Judgment:  Good  Impulse Control: Good   Risk Assessment: Danger to Self:  No Self-injurious Behavior: No Danger to Others: No Duty to Warn:no Physical Aggression / Violence:No  Access to Firearms a concern: No  Gang Involvement:No   Subjective: Counselor assessed pt current functioning per pt report.  Processed w/pt positives, stressors and mood.  Reflected positives of communication and use of grounding.  Explored ways of asserting boundaries w/son.  Pt affect wnl.  Pt reports past week- less anxiety, less escalations.  Pt reports that she and husband have been communicating well.  Pt is using coping skills of reframing, present focus and grounding.  Pt reports some stressors w/ son and his anger when she wasn't available to watch granddaughter for his whole work shift.  Pt discussed ways of asserting and boundaries. Pt reported on holidays and positives and upcoming plans w/ holidays.    Interventions: Cognitive Behavioral Therapy, Mindfulness Meditation, and  supportive  Diagnosis:Generalized anxiety disorder  Grief  Plan: Pt to f/u w/ Counseling in 2 weeks.  Practice mindful/grounding breathe practice and daily movement.  Referred for psychiatrist.  F/u as scheduled w/ PCP.      Individualized Treatment Plan Strengths: seeking counseling, enjoys getting nails done, enjoys walking and movement, enjoys the beach, family Sunday dinners  Supports: her husband, her daughter in law and her mom    Goal/Needs for Treatment:  In order of importance to patient 1) continue to cope with anxiety 2) increase self worth      Client Statement of Needs: "focus on my self and continue building my self esteem and feel better about myself and confidence. Continue to maintain progress w/ coping w/ anxiety, being able to use mindfulness and deescalate.   07/11/23 "return to counseling to help get my anxiety under control again"    Treatment Level: outpatient counseling  Symptoms:anxiety, worry, ruminating thoughts, low self worth, emotional escalations, depressed mood, sleep disturbance  Client Treatment Preferences:Monthly counseling and continue medication management w/ PCP 07/11/23 increase to every 1-2 weeks, referral for psychiatrist to restart medication.    Healthcare consumer's goal for treatment:   Counselor, Forde Radon, [facility] will support the patient's ability to achieve the goals identified. Cognitive Behavioral Therapy, Assertive Communication/Conflict Resolution Training, Relaxation Training, ACT, Humanistic and other evidenced-based practices will be used to promote progress towards healthy functioning.    Healthcare consumer will: Actively participate in therapy, working towards healthy functioning.     *Justification for Continuation/Discontinuation of Goal: R=Revised, O=Ongoing, A=Achieved, D=Discontinued   Goal 1) Pt will increase coping with life's anxiety and  stressors AEB decreased anxiety, decreased emotional escalations, decreased  sleep disturbance, increased awareness of thought patterns that impact anxiety. Baseline date 03/04/23: Progress towards goal 30%; How Often - Daily Target Date Goal Was reviewed Status Code Progress towards goal  03/03/24  07/11/23  O  returning to counseling    03/03/24                   Goal 2)  Increase self awareness, identifying her likes/values/interest and increase feeling positive self worth w/ who she is as demonstrated by expressing and communicating in sessions.  Baseline date 03/04/23: Progress towards goal 25; How Often - Daily   Target Date Goal Was reviewed Status Code Progress towards goal/Likert rating  03/03/24  07/11/23  O  returning to counseling                      This plan has been reviewed and created by the following participants:  This plan will be reviewed at least every 12 months. Date Behavioral Health Clinician Date Guardian/Patient   03/04/23 [facility] [name] [facility]                      03/04/23 Verbal consent provided  07/11/23 [name] MH [name] [facility]     07/11/23  verbal consent            Forde Radon [facility]

## 2023-12-10 ENCOUNTER — Encounter (INDEPENDENT_AMBULATORY_CARE_PROVIDER_SITE_OTHER): Payer: Self-pay | Admitting: Family Medicine

## 2023-12-10 ENCOUNTER — Ambulatory Visit: Payer: 59 | Admitting: Psychology

## 2023-12-10 DIAGNOSIS — F411 Generalized anxiety disorder: Secondary | ICD-10-CM | POA: Diagnosis not present

## 2023-12-10 DIAGNOSIS — F4321 Adjustment disorder with depressed mood: Secondary | ICD-10-CM

## 2023-12-10 NOTE — Progress Notes (Signed)
[facility] Counselor/Therapist Progress Note  Patient ID: [name], MRN: [id number],    Date: 12/10/23   Time Spent: 11:06am-11:50am  Treatment Type: Individual Therapy  Pt is seen for a virtual video visit via caregility. Pt consents to virtual visit and is aware of limitations of virtual visits.  Pt joins from her parked car from work, reporting privacy, and counselor from her home office.     Reported Symptoms: anxiety about uncertainty w/ work.  Anxiety about mom's health.  Grieving.     Mental Status Exam: Appearance:  Well Groomed     Behavior: Appropriate  Motor: Normal  Speech/Language:  Normal Rate  Affect: Appropriate  Mood: anxious  Thought process: normal  Thought content:   WNL  Sensory/Perceptual disturbances:   WNL  Orientation: oriented to person, place, time/date, and situation  Attention: Good  Concentration: Good  Memory: WNL  Fund of knowledge:  Good  Insight:   Good  Judgment:  Good  Impulse Control: Good   Risk Assessment: Danger to Self:  No Self-injurious Behavior: No Danger to Others: No Duty to Warn:no Physical Aggression / Violence:No  Access to Firearms a concern: No  Gang Involvement:No   Subjective: Counselor assessed pt current functioning per pt report.  Processed w/pt recent stressors and worry. Explored uncertainties w/ job and w/ mom's health.  Discussed how coping and being present focus in interactions.  Discussed communication w/ husband and reflect pt awareness of areas both need to work on.  Validated grief from loss of dad and gave space to express emotions.  Pt affect wnl.  Pt reports on recent stressors w/ work- discussion of potential lay offs and stress of mom's health and recent ED visit.  Pt also reports communication challenges w/ husband and how both contribute. Pt discussed her supports and how feels could manage through w/ work changes, but loss of mom would be significant.  Pt discussed  recent intensity of sadness and grief of dad and validated for self.  Pt discussed support of husband.   Interventions: Cognitive Behavioral Therapy, Mindfulness Meditation, and supportive  Diagnosis:Generalized anxiety disorder  Grief  Plan: Pt to f/u w/ Counseling in 2 weeks.  Practice mindful/grounding breathe practice and daily movement.  Referred for psychiatrist.  F/u as scheduled w/ PCP.      Individualized Treatment Plan Strengths: seeking counseling, enjoys getting nails done, enjoys walking and movement, enjoys the beach, family Sunday dinners  Supports: her husband, her daughter in law and her mom    Goal/Needs for Treatment:  In order of importance to patient 1) continue to cope with anxiety 2) increase self worth      Client Statement of Needs: "focus on my self and continue building my self esteem and feel better about myself and confidence. Continue to maintain progress w/ coping w/ anxiety, being able to use mindfulness and deescalate.   07/11/23 "return to counseling to help get my anxiety under control again"    Treatment Level: outpatient counseling  Symptoms:anxiety, worry, ruminating thoughts, low self worth, emotional escalations, depressed mood, sleep disturbance  Client Treatment Preferences:Monthly counseling and continue medication management w/ PCP 07/11/23 increase to every 1-2 weeks, referral for psychiatrist to restart medication.    Healthcare consumer's goal for treatment:   Counselor, Forde Radon, [facility] will support the patient's ability to achieve the goals identified. Cognitive Behavioral Therapy, Assertive Communication/Conflict Resolution Training, Relaxation Training, ACT, Humanistic and other evidenced-based practices will be used to promote  progress towards healthy functioning.    Healthcare consumer will: Actively participate in therapy, working towards healthy functioning.     *Justification for Continuation/Discontinuation of Goal:  R=Revised, O=Ongoing, A=Achieved, D=Discontinued   Goal 1) Pt will increase coping with life's anxiety and stressors AEB decreased anxiety, decreased emotional escalations, decreased sleep disturbance, increased awareness of thought patterns that impact anxiety. Baseline date 03/04/23: Progress towards goal 30%; How Often - Daily Target Date Goal Was reviewed Status Code Progress towards goal  03/03/24  07/11/23  O  returning to counseling    03/03/24                   Goal 2)  Increase self awareness, identifying her likes/values/interest and increase feeling positive self worth w/ who she is as demonstrated by expressing and communicating in sessions.  Baseline date 03/04/23: Progress towards goal 25; How Often - Daily   Target Date Goal Was reviewed Status Code Progress towards goal/Likert rating  03/03/24  07/11/23  O  returning to counseling                      This plan has been reviewed and created by the following participants:  This plan will be reviewed at least every 12 months. Date Behavioral Health Clinician Date Guardian/Patient   03/04/23 [name]                      03/04/23 Verbal consent provided  07/11/23 [name] MH [name] [facility]     07/11/23  verbal consent           Forde Radon [facility]

## 2023-12-23 ENCOUNTER — Ambulatory Visit (INDEPENDENT_AMBULATORY_CARE_PROVIDER_SITE_OTHER): Payer: Self-pay | Admitting: Internal Medicine

## 2023-12-23 ENCOUNTER — Encounter (INDEPENDENT_AMBULATORY_CARE_PROVIDER_SITE_OTHER): Payer: Self-pay | Admitting: Internal Medicine

## 2023-12-23 ENCOUNTER — Ambulatory Visit: Payer: 59 | Admitting: Psychology

## 2023-12-23 VITALS — BP 139/82 | HR 80 | Temp 98.3°F | Ht 65.5 in | Wt 185.0 lb

## 2023-12-23 DIAGNOSIS — R7303 Prediabetes: Secondary | ICD-10-CM

## 2023-12-23 DIAGNOSIS — E66811 Obesity, class 1: Secondary | ICD-10-CM

## 2023-12-23 DIAGNOSIS — F411 Generalized anxiety disorder: Secondary | ICD-10-CM

## 2023-12-23 DIAGNOSIS — F4321 Adjustment disorder with depressed mood: Secondary | ICD-10-CM

## 2023-12-23 DIAGNOSIS — E781 Pure hyperglyceridemia: Secondary | ICD-10-CM

## 2023-12-23 DIAGNOSIS — Z0289 Encounter for other administrative examinations: Secondary | ICD-10-CM

## 2023-12-23 DIAGNOSIS — Z6830 Body mass index (BMI) 30.0-30.9, adult: Secondary | ICD-10-CM | POA: Insufficient documentation

## 2023-12-23 NOTE — Progress Notes (Signed)
[facility] Counselor/Therapist Progress Note  Patient ID: [name], MRN: [id number],    Date: 12/23/23   Time Spent: 12:02pm-12:47pm  Treatment Type: Individual Therapy  Pt is seen for a virtual video visit via caregility. Pt consents to virtual visit and is aware of limitations of virtual visits.  Pt joins from her home, reporting privacy, and counselor from her home office.     Reported Symptoms: some down moods, grief w/ holidays, worry for mom's health, some loss of interest/motivation  Mental Status Exam: Appearance:  Well Groomed     Behavior: Appropriate  Motor: Normal  Speech/Language:  Normal Rate  Affect: Appropriate  Mood: sad  Thought process: normal  Thought content:   WNL  Sensory/Perceptual disturbances:   WNL  Orientation: oriented to person, place, time/date, and situation  Attention: Good  Concentration: Good  Memory: WNL  Fund of knowledge:  Good  Insight:   Good  Judgment:  Good  Impulse Control: Good   Risk Assessment: Danger to Self:  No Self-injurious Behavior: No Danger to Others: No Duty to Warn:no Physical Aggression / Violence:No  Access to Firearms a concern: No  Gang Involvement:No   Subjective: Counselor assessed pt current functioning per pt report.  Processed w/pt recent moods, positives and stressors. Explored interactions and engagement in activities.  Validated grief and impact.  Discussed low motivation and loss of interest and behavioral activation. Discussed plan for f/u w/ PCP re: medication management.  Pt affect wnl.  Pt reports feeing down/sad days and acknowledged some grief w/ holidays and missing dad.  Pt reports also worry w/ her mom's health and her mother in law's healthy. Pt discussed feeling some loss of interest and difficulty feeling motivation to do things.  Pt receptive to plans for behavioral activation w/ small steps.  Pt discussed plan t return to NP PCP as felt better fit and  communication w/.  Pt wants to start w/ medication management w/ her prior ot working w/ psychiatrist.     Interventions: Cognitive Behavioral Therapy, Mindfulness Meditation, and supportive  Diagnosis:No diagnosis found.  Plan: Pt to f/u w/ Counseling in 2 weeks.  Focus on daily subtle movement and engagement w/ self care activities.   F/u as scheduled w/ PCP.      Individualized Treatment Plan Strengths: seeking counseling, enjoys getting nails done, enjoys walking and movement, enjoys the beach, family Sunday dinners  Supports: her husband, her daughter in law and her mom    Goal/Needs for Treatment:  In order of importance to patient 1) continue to cope with anxiety 2) increase self worth      Client Statement of Needs: "focus on my self and continue building my self esteem and feel better about myself and confidence. Continue to maintain progress w/ coping w/ anxiety, being able to use mindfulness and deescalate.   07/11/23 "return to counseling to help get my anxiety under control again"    Treatment Level: outpatient counseling  Symptoms:anxiety, worry, ruminating thoughts, low self worth, emotional escalations, depressed mood, sleep disturbance  Client Treatment Preferences:Monthly counseling and continue medication management w/ PCP 07/11/23 increase to every 1-2 weeks, referral for psychiatrist to restart medication.    Healthcare consumer's goal for treatment:   Counselor, Forde Radon, [facility] will support the patient's ability to achieve the goals identified. Cognitive Behavioral Therapy, Assertive Communication/Conflict Resolution Training, Relaxation Training, ACT, Humanistic and other evidenced-based practices will be used to promote progress towards healthy functioning.  Healthcare consumer will: Actively participate in therapy, working towards healthy functioning.     *Justification for Continuation/Discontinuation of Goal: R=Revised, O=Ongoing, A=Achieved,  D=Discontinued   Goal 1) Pt will increase coping with life's anxiety and stressors AEB decreased anxiety, decreased emotional escalations, decreased sleep disturbance, increased awareness of thought patterns that impact anxiety. Baseline date 03/04/23: Progress towards goal 30%; How Often - Daily Target Date Goal Was reviewed Status Code Progress towards goal  03/03/24  07/11/23  O  returning to counseling    03/03/24                   Goal 2)  Increase self awareness, identifying her likes/values/interest and increase feeling positive self worth w/ who she is as demonstrated by expressing and communicating in sessions.  Baseline date 03/04/23: Progress towards goal 25; How Often - Daily   Target Date Goal Was reviewed Status Code Progress towards goal/Likert rating  03/03/24  07/11/23  O  returning to counseling                      This plan has been reviewed and created by the following participants:  This plan will be reviewed at least every 12 months. Date Behavioral Health Clinician Date Guardian/Patient   03/04/23 [facility] [name]                      03/04/23 Verbal consent provided  07/11/23 [name] MH [name] [facility]     07/11/23  verbal consent          Forde Radon [facility]

## 2023-12-23 NOTE — Progress Notes (Signed)
Office: [phone]  /  Fax: [phone]   Initial Visit  [name] was seen in clinic today to evaluate for obesity. She is interested in losing weight to improve overall health and reduce the risk of weight related complications. She presents today to review program treatment options, initial physical assessment, and evaluation.     She was referred by: PCP  When asked what else they would like to accomplish? She states: Adopt healthier eating patterns, Improve energy levels and physical activity, Improve existing medical conditions, Improve quality of life, and Improve self-confidence  Weight history: Peak 248, reached 160 and started to gain weight 5-6 months. Loss father. Did a liquid diet along with husband who had gastric   When asked how has your weight affected you? She states: Has affected self-esteem, Contributed to medical problems, Having fatigue, Having poor endurance, Problems with eating patterns, and Has affected mood   Some associated conditions: Hyperlipidemia and Prediabetes  Contributing factors: Family history of obesity, Disruption of circadian rhythm / sleep disordered breathing, Consumption of processed foods, Moderate to high levels of stress, Reduced physical activity, Eating patterns, Menopause, and Strong orexigenic signaling and inadequate inhibitory control   Weight promoting medications identified: None  Current nutrition plan: None  Current level of physical activity: None  Current or previous pharmacotherapy: Phentermine and Topiramate  Response to medication: Ineffective so it was discontinued   Past medical history includes:   Past Medical History:  Diagnosis Date   Allergy    Anxiety    Depression    GERD (gastroesophageal reflux disease)    History of abnormal cervical Pap smear    1991 -- 1995--hx cryotherapy to cervix   History of colon polyps    2008- BENIGN   Hydronephrosis, left    Kidney stones 2016   PONV  (postoperative nausea and vomiting)    Rosacea      Objective:   BP 139/82   Pulse 80   Temp 98.3 F (36.8 C)   Ht 5' 5.5" (1.664 m)   Wt 185 lb (83.9 kg)   LMP 12/24/2006 (Approximate)   SpO2 99%   BMI 30.32 kg/m  She was weighed on the bioimpedance scale: Body mass index is 30.32 kg/m.  Peak Weight:248-250 , Body Fat%: 42, Visceral Fat Rating: 11, Weight trend over the last 12 months: Increasing  General:  Alert, oriented and cooperative. Patient is in no acute distress.  Respiratory: Normal respiratory effort, no problems with respiration noted   Gait: able to ambulate independently  Mental Status: Normal mood and affect. Normal behavior. Normal judgment and thought content.   DIAGNOSTIC DATA REVIEWED:  BMET    Component Value Date/Time   NA 141 09/04/2023 0837   K 4.5 09/04/2023 0837   CL 108 09/04/2023 0837   CO2 28 09/04/2023 0837   GLUCOSE 95 09/04/2023 0837   BUN 15 09/04/2023 0837   CREATININE 0.82 09/04/2023 0837   CALCIUM 9.4 09/04/2023 0837   GFRNONAA >60 06/08/2022 2009   GFRAA >60 11/15/2017 2142   Lab Results  Component Value Date   HGBA1C 5.7 09/04/2023   HGBA1C 5.6 10/17/2015   No results found for: "INSULIN" CBC    Component Value Date/Time   WBC 4.9 09/04/2023 0837   RBC 4.63 09/04/2023 0837   HGB 13.2 09/04/2023 0837   HCT 40.5 09/04/2023 0837   PLT 260.0 09/04/2023 0837   MCV 87.5 09/04/2023 0837   MCH 28.4 06/08/2022 2009   MCHC 32.6 09/04/2023 0837  RDW 13.9 09/04/2023 0837   Iron/TIBC/Ferritin/ %Sat    Component Value Date/Time   IRON 89 11/14/2022 1321   TIBC 326.2 11/14/2022 1321   FERRITIN 87.8 11/14/2022 1321   IRONPCTSAT 27.3 11/14/2022 1321   Lipid Panel     Component Value Date/Time   CHOL 192 09/04/2023 0837   TRIG 85.0 09/04/2023 0837   HDL 58.60 09/04/2023 0837   CHOLHDL 3 09/04/2023 0837   VLDL 17.0 09/04/2023 0837   LDLCALC 116 (H) 09/04/2023 0837   LDLDIRECT 118.0 12/28/2019 1323   Hepatic Function  Panel     Component Value Date/Time   PROT 7.1 09/04/2023 0837   ALBUMIN 4.0 09/04/2023 0837   AST 15 09/04/2023 0837   ALT 12 09/04/2023 0837   ALKPHOS 82 09/04/2023 0837   BILITOT 0.4 09/04/2023 0837   BILIDIR 0.1 09/04/2023 0837      Component Value Date/Time   TSH 3.74 09/04/2023 0837     Assessment and Plan:   Prediabetes Assessment & Plan: Most recent A1c is  Lab Results  Component Value Date   HGBA1C 5.7 09/04/2023   HGBA1C 5.6 10/17/2015    Patient aware of disease state and risk of progression. This may contribute to abnormal cravings, fatigue and diabetic complications without having diabetes.   We have discussed treatment options which include: losing 7 to 10% of body weight, increasing physical activity to a goal of 150 minutes a week at moderate intensity.  Advised to maintain a diet low on simple and processed carbohydrates.  She may also be a candidate for pharmacoprophylaxis with metformin or incretin mimetic.  We will check fasting blood glucose, hemoglobin A1c and insulin levels at her intake appointment.    Class 1 obesity with serious comorbidity and body mass index (BMI) of 30.0 to 30.9 in adult, unspecified obesity type Assessment & Plan: [name] had a peak weight of 250 in the past she is currently at 185 pounds.  She has lost weight following a liquid diet down by her husband who underwent gastric bypass.  This might have resulted in loss of muscle mass and likely slowing of her weight loss due to adaptive thermogenesis.  Her physical activity levels are currently low she also has inadequate sleep has high to moderate levels of stress and reports having strong hunger signals.  She certainly benefits from medically supervised weight management program.  We reviewed anthropometrics, biometrics, associated medical conditions and contributing factors with patient. she would benefit from a medically tailored reduced calorie nutrional plan based on her REE  (resting energy expenditure), which will be determined by indirect calorimetry.  We will also assess for cardiometabolic risk and nutritional derangements via fasting labs at intake appointment.     Pure hypertriglyceridemia Assessment & Plan: LDL is not at goal. Elevated LDL may be secondary to nutrition, genetics and spillover effect from excess adiposity. Recommended LDL goal is <70 to reduce the risk of fatty streaks and the progression to obstructive ASCVD in the future.   Her 10 year risk is: The 10-year ASCVD risk score (Arnett DK, et al., 2019) is: 2.9%  Lab Results  Component Value Date   CHOL 192 09/04/2023   HDL 58.60 09/04/2023   LDLCALC 116 (H) 09/04/2023   LDLDIRECT 118.0 12/28/2019   TRIG 85.0 09/04/2023   CHOLHDL 3 09/04/2023   She is currently not on lipid-lowering medication and based on cardiovascular risk does not benefit from one. Losing 10% of body weight may improve condition also maintaining a  diet low in saturated fats.            Obesity Treatment / Action Plan:  Patient will work on garnering support from family and friends to begin weight loss journey. Will work on eliminating or reducing the presence of highly palatable, calorie dense foods in the home. Will complete provided nutritional and psychosocial assessment questionnaire before the next appointment. Will be scheduled for indirect calorimetry to determine resting energy expenditure in a fasting state.  This will allow [place] to create a reduced calorie, high-protein meal plan to promote loss of fat mass while preserving muscle mass. Counseled on the health benefits of losing 5%-15% of total body weight. Was counseled on nutritional approaches to weight loss and benefits of reducing processed foods and consuming plant-based foods and high quality protein as part of nutritional weight management. Was counseled on pharmacotherapy and role as an adjunct in weight management.   Obesity Education  Performed Today:  She was weighed on the bioimpedance scale and results were discussed and documented in the synopsis.  We discussed obesity as a disease and the importance of a more detailed evaluation of all the factors contributing to the disease.  We discussed the importance of long term lifestyle changes which include nutrition, exercise and behavioral modifications as well as the importance of customizing this to her specific health and social needs.  We discussed the benefits of reaching a healthier weight to alleviate the symptoms of existing conditions and reduce the risks of the biomechanical, metabolic and psychological effects of obesity.  [name] appears to be in the action stage of change and states they are ready to start intensive lifestyle modifications and behavioral modifications.  30 minutes was spent today on this visit including the above counseling, pre-visit chart review, and post-visit documentation.  Reviewed by clinician on day of visit: allergies, medications, problem list, medical history, surgical history, family history, social history, and previous encounter notes pertinent to obesity diagnosis.   Worthy Rancher, MD

## 2023-12-23 NOTE — Assessment & Plan Note (Signed)
Most recent A1c is  Lab Results  Component Value Date   HGBA1C 5.7 09/04/2023   HGBA1C 5.6 10/17/2015    Patient aware of disease state and risk of progression. This may contribute to abnormal cravings, fatigue and diabetic complications without having diabetes.   We have discussed treatment options which include: losing 7 to 10% of body weight, increasing physical activity to a goal of 150 minutes a week at moderate intensity.  Advised to maintain a diet low on simple and processed carbohydrates.  She may also be a candidate for pharmacoprophylaxis with metformin or incretin mimetic.  We will check fasting blood glucose, hemoglobin A1c and insulin levels at her intake appointment.

## 2023-12-23 NOTE — Assessment & Plan Note (Signed)
LDL is not at goal. Elevated LDL may be secondary to nutrition, genetics and spillover effect from excess adiposity. Recommended LDL goal is <70 to reduce the risk of fatty streaks and the progression to obstructive ASCVD in the future.   Her 10 year risk is: The 10-year ASCVD risk score (Arnett DK, et al., 2019) is: 2.9%  Lab Results  Component Value Date   CHOL 192 09/04/2023   HDL 58.60 09/04/2023   LDLCALC 116 (H) 09/04/2023   LDLDIRECT 118.0 12/28/2019   TRIG 85.0 09/04/2023   CHOLHDL 3 09/04/2023   She is currently not on lipid-lowering medication and based on cardiovascular risk does not benefit from one. Losing 10% of body weight may improve condition also maintaining a diet low in saturated fats.

## 2023-12-23 NOTE — Assessment & Plan Note (Signed)
[name] had a peak weight of 250 in the past she is currently at 185 pounds.  She has lost weight following a liquid diet down by her husband who underwent gastric bypass.  This might have resulted in loss of muscle mass and likely slowing of her weight loss due to adaptive thermogenesis.  Her physical activity levels are currently low she also has inadequate sleep has high to moderate levels of stress and reports having strong hunger signals.  She certainly benefits from medically supervised weight management program.  We reviewed anthropometrics, biometrics, associated medical conditions and contributing factors with patient. she would benefit from a medically tailored reduced calorie nutrional plan based on her REE (resting energy expenditure), which will be determined by indirect calorimetry.  We will also assess for cardiometabolic risk and nutritional derangements via fasting labs at intake appointment.

## 2024-01-09 ENCOUNTER — Ambulatory Visit (INDEPENDENT_AMBULATORY_CARE_PROVIDER_SITE_OTHER): Payer: Self-pay | Admitting: Psychology

## 2024-01-09 DIAGNOSIS — F411 Generalized anxiety disorder: Secondary | ICD-10-CM

## 2024-01-09 DIAGNOSIS — F4321 Adjustment disorder with depressed mood: Secondary | ICD-10-CM

## 2024-01-09 NOTE — Progress Notes (Signed)
[facility] Counselor/Therapist Progress Note  Patient ID: [name], MRN: [id number],    Date: 01/09/24   Time Spent: 1:31pm-2:22pm  Treatment Type: Individual Therapy  Pt is seen for a virtual video visit via caregility. Pt consents to virtual visit and is aware of limitations of virtual visits.  Pt joins from her home, reporting privacy, and counselor from her office.     Reported Symptoms: some moments of grief, overwhelmed w/ stressors this past week.  Able to communicate w/ husband following conflict.  Mental Status Exam: Appearance:  Well Groomed     Behavior: Appropriate  Motor: Normal  Speech/Language:  Normal Rate  Affect: Appropriate  Mood: sad and Overwhelmed at times  Thought process: normal  Thought content:   WNL  Sensory/Perceptual disturbances:   WNL  Orientation: oriented to person, place, time/date, and situation  Attention: Good  Concentration: Good  Memory: WNL  Fund of knowledge:  Good  Insight:   Good  Judgment:  Good  Impulse Control: Good   Risk Assessment: Danger to Self:  No Self-injurious Behavior: No Danger to Others: No Duty to Warn:no Physical Aggression / Violence:No  Access to Firearms a concern: No  Gang Involvement:No   Subjective: Counselor assessed pt current functioning per pt report.  Processed w/pt recent moods, positives and stressors. Explored when overwhelmed and contributing factors.  Explored use of coping skills and acknowledging feelings and grief.  Discussed descalation following conflict w/ husband and how able to effectively communicate later together.  Pt affect wnl.  Pt reports that she did have emotional escalation when overwhelmed this week.  Pt recognized that a lot of stressors between work and trying to watch granddaughters on snow days. Pt was able to assert boundaries needed.  Pt reports that recent conflict w/ husband and was able to not escalate when he shut down talking about and  later came back and had productive communication w/ each other.  Pt discussed some memories and grief and sadness over past couple of weeks.    Interventions: Cognitive Behavioral Therapy, Mindfulness Meditation, and supportive  Diagnosis:Generalized anxiety disorder  Grief  Plan: Pt to f/u w/ Counseling in 2 weeks.  Focus on daily subtle movement and engagement w/ self care activities.   F/u as scheduled w/ PCP.      Individualized Treatment Plan Strengths: seeking counseling, enjoys getting nails done, enjoys walking and movement, enjoys the beach, family Sunday dinners  Supports: her husband, her daughter in law and her mom    Goal/Needs for Treatment:  In order of importance to patient 1) continue to cope with anxiety 2) increase self worth      Client Statement of Needs: "focus on my self and continue building my self esteem and feel better about myself and confidence. Continue to maintain progress w/ coping w/ anxiety, being able to use mindfulness and deescalate.   07/11/23 "return to counseling to help get my anxiety under control again"    Treatment Level: outpatient counseling  Symptoms:anxiety, worry, ruminating thoughts, low self worth, emotional escalations, depressed mood, sleep disturbance  Client Treatment Preferences:Monthly counseling and continue medication management w/ PCP 07/11/23 increase to every 1-2 weeks, referral for psychiatrist to restart medication.    Healthcare consumer's goal for treatment:   Counselor, Forde Radon, [facility] will support the patient's ability to achieve the goals identified. Cognitive Behavioral Therapy, Assertive Communication/Conflict Resolution Training, Relaxation Training, ACT, Humanistic and other evidenced-based practices will be used to promote progress  towards healthy functioning.    Healthcare consumer will: Actively participate in therapy, working towards healthy functioning.     *Justification for  Continuation/Discontinuation of Goal: R=Revised, O=Ongoing, A=Achieved, D=Discontinued   Goal 1) Pt will increase coping with life's anxiety and stressors AEB decreased anxiety, decreased emotional escalations, decreased sleep disturbance, increased awareness of thought patterns that impact anxiety. Baseline date 03/04/23: Progress towards goal 30%; How Often - Daily Target Date Goal Was reviewed Status Code Progress towards goal  03/03/24  07/11/23  O  returning to counseling    03/03/24                   Goal 2)  Increase self awareness, identifying her likes/values/interest and increase feeling positive self worth w/ who she is as demonstrated by expressing and communicating in sessions.  Baseline date 03/04/23: Progress towards goal 25; How Often - Daily   Target Date Goal Was reviewed Status Code Progress towards goal/Likert rating  03/03/24  07/11/23  O  returning to counseling                      This plan has been reviewed and created by the following participants:  This plan will be reviewed at least every 12 months. Date Behavioral Health Clinician Date Guardian/Patient   03/04/23 [facility] [name] [facility]                      03/04/23 Verbal consent provided  07/11/23 [name] MH [name] [facility]     07/11/23  verbal consent          Forde Radon [facility]

## 2024-01-20 ENCOUNTER — Encounter (INDEPENDENT_AMBULATORY_CARE_PROVIDER_SITE_OTHER): Payer: Self-pay | Admitting: Family Medicine

## 2024-01-20 ENCOUNTER — Ambulatory Visit (INDEPENDENT_AMBULATORY_CARE_PROVIDER_SITE_OTHER): Payer: Commercial Managed Care - PPO | Admitting: Family Medicine

## 2024-01-20 VITALS — BP 118/73 | HR 74 | Temp 98.2°F | Ht 66.0 in | Wt 189.0 lb

## 2024-01-20 DIAGNOSIS — R0602 Shortness of breath: Secondary | ICD-10-CM

## 2024-01-20 DIAGNOSIS — Z6830 Body mass index (BMI) 30.0-30.9, adult: Secondary | ICD-10-CM

## 2024-01-20 DIAGNOSIS — F411 Generalized anxiety disorder: Secondary | ICD-10-CM | POA: Insufficient documentation

## 2024-01-20 DIAGNOSIS — R5383 Other fatigue: Secondary | ICD-10-CM

## 2024-01-20 DIAGNOSIS — Z1331 Encounter for screening for depression: Secondary | ICD-10-CM

## 2024-01-20 DIAGNOSIS — E782 Mixed hyperlipidemia: Secondary | ICD-10-CM

## 2024-01-20 DIAGNOSIS — E669 Obesity, unspecified: Secondary | ICD-10-CM

## 2024-01-20 DIAGNOSIS — R7303 Prediabetes: Secondary | ICD-10-CM | POA: Diagnosis not present

## 2024-01-20 DIAGNOSIS — K760 Fatty (change of) liver, not elsewhere classified: Secondary | ICD-10-CM

## 2024-01-20 NOTE — Progress Notes (Signed)
[name], D.O.  ABFM, ABOM Specializing in Clinical Bariatric Medicine Office located at: [address]  [place], [place]  [address]     Bariatric Medicine Visit  Dear Brittany Salisbury, MD   Thank you for referring [name] to our clinic today for evaluation.  We performed a consultation to discuss her options for treatment and educate the patient on her disease state.  The following note includes my evaluation and treatment recommendations.   Please do not hesitate to reach out to me directly if you have any further concerns.    Assessment and Plan:   FOR THE DISEASE OF OBESITY: Obesity (BMI 30-39.9) - Starting BMI 30.52 Assessment & Plan:  Recommended Dietary Goals [name] is currently in the action stage of change. As such, her goal is to start our weight management plan.  She has agreed to: follow the Category 1 plan - 1000 kcal per day   Behavioral Intervention We discussed the following Behavioral Modification Strategies today: increasing lean protein intake to established goals, decreasing simple carbohydrates , increasing vegetables, increasing lower glycemic fruits, increasing fiber rich foods, avoiding skipping meals, increasing water intake , work on meal planning and preparation, keeping healthy foods at home, identifying sources and decreasing liquid calories, decreasing eating out or consumption of processed foods, and making healthy choices when eating convenient foods, practice mindfulness eating and understand the difference between hunger signals and cravings, work on managing stress, creating time for self-care and relaxation, avoiding temptations and identifying enticing environmental cues, planning for success, and discussed pre-packaged healthier meals such as [name], Whole 30 chicken meals, Longlife meal prep and Factor Meals etc  Additional resources provided today: None  Evidence-based interventions for health behavior  change were utilized today including the discussion of self monitoring techniques, problem-solving barriers and SMART goal setting techniques.    Pt will specifically work on: start following her meal plan for next visit.   Recommended Physical Activity Goals [name] has been advised to work up to 150 minutes of moderate intensity aerobic activity a week and strengthening exercises 2-3 times per week for cardiovascular health, weight loss maintenance and preservation of muscle mass.    She has agreed to :  Think about enjoyable ways to increase daily physical activity and overcoming barriers to exercise   Pharmacotherapy We discussed various medication options to help [name] with her weight loss efforts and we both agreed to : continue with nutritional and behavioral strategies and adequate clinical response to current dose, continue current regimen (Topamax)   FOR ASSOCIATED CONDITIONS ADDRESSED TODAY: Fatigue Assessment & Plan: [name] does feel that her weight is causing her energy to be lower than it should be. Fatigue may be related to obesity, depression or many other causes. she does not appear to have any red flag symptoms and this appears to most likely be related to her current lifestyle habits and dietary intake.  Labs will be ordered and reviewed with her at their next office visit in two weeks.  Epworth sleepiness scale  Her ESS score is 10. [name] admits to daytime somnolence and admits to waking up still tired. Patient has a history of symptoms of daytime fatigue, Epworth sleepiness scale, and morning headache (1-2 times a week). [name] generally gets 6 hours of sleep per night, and states that she has not been getting restful sleep. Snoring is present. Apneic episodes are not present.   If not treated, OSA can affect her life and make  weight loss difficult. I highly recommend she get evaluated for sleep study with PCP - [name]. Pt will discuss with PCP in very near future for  evaluation for sleep, given her high ESS score.    ECG: Performed and reviewed/ interpreted independently.  Normal sinus rhythm, rate 62 bpm; reassuring without any acute abnormalities, will continue to monitor for symptoms    Orders: - EKG 12-Lead - VITAMIN D 25 Hydroxy (Vit-D Deficiency, Fractures) - TSH - T4, free - Folate - CBC with Differential/Platelet - Vitamin B12  Shortness of breath on exertion Assessment & Plan: [name] does feel that she gets out of breath more easily than she used to when she exercises and seems to be worsening over time with weight gain.  This has gotten worse recently. [name] denies shortness of breath at rest or orthopnea. [name]'s shortness of breath appears to be obesity related and exercise induced, as they do not appear to have any "red flag" symptoms/ concerns today.  Also, this condition appears to be related to a state of poor cardiovascular conditioning   Obtain labs today and will be reviewed with her at their next office visit in two weeks.  Indirect Calorimeter completed today to help guide our dietary regimen. It shows a VO2 of 224 and a REE of 1541.  Her calculated basal metabolic rate is 1610 thus her measured basal metabolic rate is better than expected.  Patient agreed to work on weight loss at this time.  As [name] progresses through our weight loss program, we will gradually increase exercise as tolerated to treat her current condition.   If [name] follows our recommendations and loses 5-10% of their weight without improvement of her shortness of breath or if at any time, symptoms become more concerning, they agree to urgently follow up with their PCP/ specialist for further consideration/ evaluation.   [name] verbalizes agreement with this plan.    Depression screen GAD (generalized anxiety disorder) - emotional eating Assessment & Plan: Her Food and Mood (modified PHQ-9) score was 15. Denies any SI/HI. Pt was previously was on Prozac.  Was also on Wellbutrin but forgets why she stopped taking. She is on Xanax 1 mg BID as needed, reports only taking about once every 2 weeks. Pt prefers not to be on daily medication for this. Endorses uncontrolled cravings when stressed. Is established with behavioral health specialist and follows up every 2 weeks. Reports emotional eating when stressed and angry.  Per New Patient Packet : -Tends to hide/sneak foods to avoid seeing how much junk food she is eating  - Feels guilt/self-hate after over eating or making poor choices - because she "knows better" - Feels judged by her peers - Sometimes feels out of control with her eating  - Reports eating a larger portion than avg person about 2-3 times per week  - No prior hx of an eating disorder   Patient is agreeable to being referred to Dr. Dewaine Conger, our Bariatric Psychologist, for evaluation due to her elevated PHQ-9 score and significant struggles with emotional eating.Reminded patient of the importance of following their prudent nutrition plan and how food can affect mood as well to support emotional wellbeing. Continue to follow up with her behavioral health specialist as instructed by them.    Prediabetes Assessment & Plan: Lab Results  Component Value Date   HGBA1C 5.7 09/04/2023   HGBA1C 5.6 01/21/2023   HGBA1C 5.7 07/13/2022    Last A1C is stable. About 6 yrs ago back on  03/18/17 highest A1C at 5.8. Not currently on any medications. Diet/exercise approach.   Encouraged pt to follow her new nutritional meal plan. Avoid eating out, prioritize meeting daily protein goal, and eat low carb non-starchy vegetables. Drink water daily, at least half of her body weight in ounces of water. Continue with current regimen. Will review labs obtained today at her next OV.   Orders: - Insulin, random - Hemoglobin A1c   Metabolic dysfunction-associated steatotic liver disease (MASLD) Assessment & Plan: Pt reports hx of MA SLD, dx while pt lived in  [place]. Around May of 2018 was admitted in hospital and was told her fatty liver enzymes were elevated. Has not seen specialist for this and not on any meds currently. Will order CBC and review results with pt at her next OV. Will continue to monitor her condition as it relates to her weight loss journey. No changes made to care in this regard today.   Orders: - CBC with Differential/Platelet   Mixed hyperlipidemia Assessment & Plan: Lab Results  Component Value Date   CHOL 192 09/04/2023   HDL 58.60 09/04/2023   LDLCALC 116 (H) 09/04/2023   LDLDIRECT 118.0 12/28/2019   TRIG 85.0 09/04/2023   CHOLHDL 3 09/04/2023   Last HDL at goal and LDL elevated at 116 per labs on 09/04/23. LDL not elevated enough to be on any medications. Managed with diet/exercise. Will continue to monitor her condition as it pertain to her weight loss and overall health. Will order lipid panel and CMP, to be reviewed with pt at her next OV.    Orders: - Lipid Panel With LDL/HDL Ratio - Comprehensive metabolic panel   FOLLOW UP:   Follow up in 2 weeks. She was informed of the importance of frequent follow up visits to maximize her success with intensive lifestyle modifications for her multiple health conditions.  [name] is aware that we will review all of her lab results at our next visit.  She is aware that if anything is critical/ life threatening with the results, we will be contacting her via MyChart prior to the office visit to discuss management.    Chief Complaint:   OBESITY [name] (MR# [id number]) is a 58 y.o. female who presents for evaluation and treatment of obesity and related comorbidities. Current BMI is Body mass index is 30.51 kg/m. Codie Hainer has been struggling with her weight for many years and has been unsuccessful in either losing weight, maintaining weight loss, or reaching her healthy weight goal.  Ashawnti Tangen is currently in the  action stage of change and ready to dedicate time achieving and maintaining a healthier weight. Matisyn Cabeza is interested in becoming our patient and working on intensive lifestyle modifications including (but not limited to) diet and exercise for weight loss.  Jermesha Sottile works as an Scientist, water quality, working from home. Patient is married to and lives with her husband, Koleen Nimrod 59 y/o).  Husband is supportive of her weight loss efforts.   Pt not not exercising currently.   Desires to lose 40 lbs in 5-6 months.   Reasons she would like to lose weight: to feel better mentally/physically.   Started gaining weight after the birth of first child.  Has been on Weight Watchers in past. First time she lost 70 lbs and the second time she lost 30 lbs - both times put the weight back on.   Doesn't enjoy cooking.  Times per week eating out :  5 times for dinner and eating take out/fast foods about 3-4 times.  Cravings: sweets/mashed potatoes.  Dislikes: all seafood.  Snacks on chips and sweets.   Skips breakfast 3 days a week.  Drinks coffee with creamer/sweeteners.  Worst food habit: eating when stressed.  Tends to eat her dinner late.  Attributed reasons for weight gain: busy schedule, eating wronge foods, and her father's recent passing.   Heavyiest weight reported was 250 lbs.   Subjective:   This is the patient's first visit at [facility].  The patient's NEW PATIENT PACKET that they filled out prior to today's office visit was reviewed at length and information from that paperwork was included within the following office visit note.    Included in the packet: current and past health history, medications, allergies, ROS, gynecologic history (women only), surgical history, family history, social history, weight history, weight loss surgery history (for those that have had weight loss surgery), nutritional evaluation, mood and food questionnaire  along with a depression screening (PHQ9) on all patients, an Epworth questionnaire, sleep habits questionnaire, patient life and health improvement goals questionnaire. These will all be scanned into the patient's chart under the "media" tab.   Review of Systems: Please refer to new patient packet scanned into media. Pertinent positives were addressed with patient today.  Reviewed by clinician on day of visit: allergies, medications, problem list, medical history, surgical history, family history, social history, and previous encounter notes.  During the visit, I independently reviewed the patient's EKG, bioimpedance scale results, and indirect calorimeter results. I used this information to tailor a meal plan for the patient that will help [name] to lose weight and will improve her obesity-related conditions going forward.  I performed a medically necessary appropriate examination and/or evaluation. I discussed the assessment and treatment plan with the patient. The patient was provided an opportunity to ask questions and all were answered. The patient agreed with the plan and demonstrated an understanding of the instructions. Labs were ordered today (unless patient declined them) and will be reviewed with the patient at our next visit unless more critical results need to be addressed immediately. Clinical information was updated and documented in the EMR.    Objective:   PHYSICAL EXAM: Blood pressure 118/73, pulse 74, temperature 98.2 F (36.8 C), height 5\' 6"  (1.676 m), weight 189 lb (85.7 kg), last menstrual period 12/24/2006, SpO2 99%. Body mass index is 30.51 kg/m.  General: Well Developed, well nourished, and in no acute distress.  HEENT: Normocephalic, atraumatic; EOMI, sclerae are anicteric. Skin: Warm and dry, good turgor Chest:  Normal excursion, shape, no gross ABN Respiratory: No conversational dyspnea; speaking in full sentences NeuroM-Sk:  Normal gross ROM * 4  extremities  Psych: A and O *3, insight adequate, mood- full   Anthropometric Measurements Height: 5\' 6"  (1.676 m) Weight: 189 lb (85.7 kg) BMI (Calculated): 30.52 Starting Weight: 189 lb Peak Weight: 250 lb Waist Measurement : 41 inches   Body Composition  Body Fat %: 42.7 % Fat Mass (lbs): 80.8 lbs Muscle Mass (lbs): 102.8 lbs Total Body Water (lbs): 71.8 lbs Visceral Fat Rating : 11   Other Clinical Data RMR: [id number] Fasting: Yes Labs: Yes Today's Visit #: 1 Starting Date: 01/19/23    DIAGNOSTIC DATA REVIEWED:  BMET    Component Value Date/Time   NA 141 09/04/2023 0837   K 4.5 09/04/2023 0837   CL 108 09/04/2023 0837   CO2 28 09/04/2023 0837   GLUCOSE  95 09/04/2023 0837   BUN 15 09/04/2023 0837   CREATININE 0.82 09/04/2023 0837   CALCIUM 9.4 09/04/2023 0837   GFRNONAA >60 06/08/2022 2009   GFRAA >60 11/15/2017 2142   Lab Results  Component Value Date   HGBA1C 5.7 09/04/2023   HGBA1C 5.6 10/17/2015   No results found for: "INSULIN" Lab Results  Component Value Date   TSH 3.74 09/04/2023   CBC    Component Value Date/Time   WBC 4.9 09/04/2023 0837   RBC 4.63 09/04/2023 0837   HGB 13.2 09/04/2023 0837   HCT 40.5 09/04/2023 0837   PLT 260.0 09/04/2023 0837   MCV 87.5 09/04/2023 0837   MCH 28.4 06/08/2022 2009   MCHC 32.6 09/04/2023 0837   RDW 13.9 09/04/2023 0837   Iron Studies    Component Value Date/Time   IRON 89 11/14/2022 1321   TIBC 326.2 11/14/2022 1321   FERRITIN 87.8 11/14/2022 1321   IRONPCTSAT 27.3 11/14/2022 1321   Lipid Panel     Component Value Date/Time   CHOL 192 09/04/2023 0837   TRIG 85.0 09/04/2023 0837   HDL 58.60 09/04/2023 0837   CHOLHDL 3 09/04/2023 0837   VLDL 17.0 09/04/2023 0837   LDLCALC 116 (H) 09/04/2023 0837   LDLDIRECT 118.0 12/28/2019 1323   Hepatic Function Panel     Component Value Date/Time   PROT 7.1 09/04/2023 0837   ALBUMIN 4.0 09/04/2023 0837   AST 15 09/04/2023 0837   ALT 12 09/04/2023  0837   ALKPHOS 82 09/04/2023 0837   BILITOT 0.4 09/04/2023 0837   BILIDIR 0.1 09/04/2023 0837      Component Value Date/Time   TSH 3.74 09/04/2023 0837   Nutritional Lab Results  Component Value Date   VD25OH 37.16 04/04/2020   VD25OH 28.91 (L) 01/26/2020   VD25OH 20.70 (L) 12/28/2019    Attestation Statements:   Burnett Sheng, acting as a medical scribe for Thomasene Lot, DO., have compiled all relevant documentation for today's office visit on behalf of Thomasene Lot, DO, while in the presence of [employer], DO.  Reviewed by clinician on day of visit: allergies, medications, problem list, medical history, surgical history, family history, social history, and previous encounter notes pertinent to patient's obesity diagnosis.  I have spent 40 minutes in the care of the patient today including: preparing to see patient (e.g. review and interpretation of tests, old notes ), obtaining and/or reviewing separately obtained history, performing a medically appropriate examination or evaluation, counseling and educating the patient, ordering medications, test or procedures, documenting clinical information in the electronic or other health care record, and independently interpreting results and communicating results to the patient, family, or caregiver   I have reviewed the above documentation for accuracy and completeness, and I agree with the above. [name], D.O.  The 21st Century Cures Act was signed into law in 2016 which includes the topic of electronic health records.  This provides immediate access to information in MyChart.  This includes consultation notes, operative notes, office notes, lab results and pathology reports.  If you have any questions about what you read please let us know at your next visit so we can discuss your concerns and take corrective action if need be.  We are right here with you.

## 2024-01-21 LAB — HEMOGLOBIN A1C
Est. average glucose Bld gHb Est-mCnc: 111 mg/dL
Hgb A1c MFr Bld: 5.5 % (ref 4.8–5.6)

## 2024-01-21 LAB — COMPREHENSIVE METABOLIC PANEL
ALT: 13 [IU]/L (ref 0–32)
AST: 15 [IU]/L (ref 0–40)
Albumin: 4.5 g/dL (ref 3.8–4.9)
Alkaline Phosphatase: 101 [IU]/L (ref 44–121)
BUN/Creatinine Ratio: 18 (ref 9–23)
BUN: 13 mg/dL (ref 6–24)
Bilirubin Total: 0.3 mg/dL (ref 0.0–1.2)
CO2: 24 mmol/L (ref 20–29)
Calcium: 9.9 mg/dL (ref 8.7–10.2)
Chloride: 105 mmol/L (ref 96–106)
Creatinine, Ser: 0.72 mg/dL (ref 0.57–1.00)
Globulin, Total: 2.6 g/dL (ref 1.5–4.5)
Glucose: 97 mg/dL (ref 70–99)
Potassium: 4.5 mmol/L (ref 3.5–5.2)
Sodium: 143 mmol/L (ref 134–144)
Total Protein: 7.1 g/dL (ref 6.0–8.5)
eGFR: 97 mL/min/{1.73_m2} (ref >59)

## 2024-01-21 LAB — LIPID PANEL WITH LDL/HDL RATIO
Cholesterol, Total: 213 mg/dL — ABNORMAL HIGH (ref 100–199)
HDL: 54 mg/dL (ref >39)
LDL Chol Calc (NIH): 141 mg/dL — ABNORMAL HIGH (ref 0–99)
LDL/HDL Ratio: 2.6 {ratio} (ref 0.0–3.2)
Triglycerides: 100 mg/dL (ref 0–149)
VLDL Cholesterol Cal: 18 mg/dL (ref 5–40)

## 2024-01-21 LAB — CBC WITH DIFFERENTIAL/PLATELET
Basophils Absolute: 0 10*3/uL (ref 0.0–0.2)
Basos: 0 %
EOS (ABSOLUTE): 0 10*3/uL (ref 0.0–0.4)
Eos: 1 %
Hematocrit: 42.3 % (ref 34.0–46.6)
Hemoglobin: 14.3 g/dL (ref 11.1–15.9)
Immature Grans (Abs): 0 10*3/uL (ref 0.0–0.1)
Immature Granulocytes: 0 %
Lymphocytes Absolute: 1.4 10*3/uL (ref 0.7–3.1)
Lymphs: 31 %
MCH: 29.4 pg (ref 26.6–33.0)
MCHC: 33.8 g/dL (ref 31.5–35.7)
MCV: 87 fL (ref 79–97)
Monocytes Absolute: 0.4 10*3/uL (ref 0.1–0.9)
Monocytes: 8 %
Neutrophils Absolute: 2.8 10*3/uL (ref 1.4–7.0)
Neutrophils: 60 %
Platelets: 253 10*3/uL (ref 150–450)
RBC: 4.87 x10E6/uL (ref 3.77–5.28)
RDW: 12.8 % (ref 11.7–15.4)
WBC: 4.7 10*3/uL (ref 3.4–10.8)

## 2024-01-21 LAB — VITAMIN D 25 HYDROXY (VIT D DEFICIENCY, FRACTURES): Vit D, 25-Hydroxy: 33.4 ng/mL (ref 30.0–100.0)

## 2024-01-21 LAB — FOLATE: Folate: 6.6 ng/mL (ref >3.0)

## 2024-01-21 LAB — INSULIN, RANDOM: INSULIN: 11.2 u[IU]/mL (ref 2.6–24.9)

## 2024-01-21 LAB — TSH: TSH: 2.19 u[IU]/mL (ref 0.450–4.500)

## 2024-01-21 LAB — T4, FREE: Free T4: 1.09 ng/dL (ref 0.82–1.77)

## 2024-01-21 LAB — VITAMIN B12: Vitamin B-12: 411 pg/mL (ref 232–1245)

## 2024-01-27 ENCOUNTER — Encounter: Payer: Self-pay | Admitting: Family Medicine

## 2024-01-27 ENCOUNTER — Telehealth (INDEPENDENT_AMBULATORY_CARE_PROVIDER_SITE_OTHER): Payer: Self-pay | Admitting: Family Medicine

## 2024-01-27 DIAGNOSIS — G4730 Sleep apnea, unspecified: Secondary | ICD-10-CM

## 2024-01-27 DIAGNOSIS — Z6830 Body mass index (BMI) 30.0-30.9, adult: Secondary | ICD-10-CM

## 2024-01-27 DIAGNOSIS — F900 Attention-deficit hyperactivity disorder, predominantly inattentive type: Secondary | ICD-10-CM

## 2024-01-27 DIAGNOSIS — E66811 Obesity, class 1: Secondary | ICD-10-CM

## 2024-01-27 NOTE — Progress Notes (Signed)
Subjective:    Patient ID: [name], female    DOB: [birth year], 58 y.o.   MRN: [id number]  HPI Virtual Visit via Video Note  I connected with the patient on 01/27/24 at  2:00 PM EST by a video enabled telemedicine application and verified that I am speaking with the correct person using two identifiers.  Location patient: home Location provider:work or home office Persons participating in the virtual visit: patient, provider  I discussed the limitations of evaluation and management by telemedicine and the availability of in person appointments. The patient expressed understanding and agreed to proceed.   HPI: Here for 3 issues. First she has started working with the [facility] clinic, and they are working with her on diet and exercise. She has stopped taking Topiramate because it was not helping. Her recent LDL was 141, and she is concerned because of a family hx of heart disease. She asks my opinion. Also she asks to get back on ADHD medication. She has a hard time focusing on her job,and her boss has commented that she is "all over the place" with her thoughts at times. Third the weight clinic suggested she be evaluate for possible sleep apnea due to her daytime sleepiness.    ROS: See pertinent positives and negatives per HPI.  Past Medical History:  Diagnosis Date   ADHD    Allergy    Anxiety    Back pain    Constipated    Depression    Fatty liver    GERD (gastroesophageal reflux disease)    History of abnormal cervical Pap smear    1991 -- 1995--hx cryotherapy to cervix   History of colon polyps    2008- BENIGN   Hydronephrosis, left    Kidney stones 2016   PONV (postoperative nausea and vomiting)    RLS (restless legs syndrome)    Rosacea    Vitamin D deficiency     Past Surgical History:  Procedure Laterality Date   ABDOMINAL HYSTERECTOMY     ANTERIOR AND POSTERIOR REPAIR WITH SACROSPINOUS FIXATION N/A 08/16/2015   Procedure: ANTERIOR  COLPORRHAPHY WITH XENOFORM GRAFT AND SACROSPINOUS FIXATION;  Surgeon: Patton Salles, MD;  Location: [facility];  Service: Gynecology;  Laterality: N/A;  2.5 hours OR time   BLADDER SUSPENSION N/A 08/16/2015   Procedure: TRANSVAGINAL TAPE (TVT) PROCEDURE exact midurethral sling;  Surgeon: Patton Salles, MD;  Location: [facility];  Service: Gynecology;  Laterality: N/A;   CHOLECYSTECTOMY  1998   COLONOSCOPY  08/21/2017   per Dr. Christella Hartigan, clear, repeat in 10 yrs   CYSTOCELE REPAIR N/A 12/12/2022   Procedure: ANTERIOR REPAIR (CYSTOCELE);  Surgeon: Marguerita Beards, MD;  Location: [facility];  Service: Gynecology;  Laterality: N/A;   CYSTOSCOPY N/A 08/16/2015   Procedure: CYSTOSCOPY;  Surgeon: Patton Salles, MD;  Location: [facility];  Service: Gynecology;  Laterality: N/A;   CYSTOSCOPY N/A 12/12/2022   Procedure: CYSTOSCOPY;  Surgeon: Marguerita Beards, MD;  Location: [facility];  Service: Gynecology;  Laterality: N/A;   CYSTOSCOPY W/ RETROGRADES Bilateral 06/22/2015   Procedure: CYSTOSCOPY WITH RETROGRADE PYELOGRAM;  Surgeon: Malen Gauze, MD;  Location: [facility];  Service: Urology;  Laterality: Bilateral;   CYSTOSCOPY W/ URETERAL STENT PLACEMENT  01/1999   CYSTOSCOPY WITH HOLMIUM LASER LITHOTRIPSY Left 05/18/2015   Procedure: CYSTOSCOPY WITH HOLMIUM LASER LITHOTRIPSY;  Surgeon: Sebastian Ache, MD;  Location: [facility];  Service: Urology;  Laterality: Left;   CYSTOSCOPY WITH RETROGRADE PYELOGRAM, URETEROSCOPY AND STENT PLACEMENT Left 06/22/2015   Procedure: CYSTOSCOPY,  LEFT URETEROSCOPY;  Surgeon: Malen Gauze, MD;  Location: [facility];  Service: Urology;  Laterality: Left;   CYSTOSCOPY WITH URETEROSCOPY AND STENT PLACEMENT Left 05/18/2015   Procedure: CYSTOSCOPY WITH URETEROSCOPY, STONE MANIPULATION AND STENT PLACEMENT;  Surgeon: Sebastian Ache, MD;  Location: [facility];  Service: Urology;  Laterality: Left;   DIAGNOSTIC LAPAROSCOPY     DX LAPAROSCOPY  X2   ESOPHAGOGASTRODUODENOSCOPY  05/09/2007   EXPLORATORY LAPAROTOMY W/ BILATERAL SALPINGECTOMY AND REMOVAL RIGHT ECTOPIC PREG.  02/23/2004   EXTRACORPOREAL SHOCK WAVE LITHOTRIPSY  2001  &  2002   LAPAROSCOPIC CHOLECYSTECTOMY  1999   POLYPECTOMY     ROBOTIC ASSISTED LAPAROSCOPIC SACROCOLPOPEXY N/A 12/12/2022   Procedure: XI ROBOTIC ASSISTED LAPAROSCOPIC SACROCOLPOPEXY;  Surgeon: Marguerita Beards, MD;  Location: [facility];  Service: Gynecology;  Laterality: N/A;  Total time requested 3 hours -Assist requested   SHOULDER ARTHROSCOPY WITH OPEN ROTATOR CUFF REPAIR Right 2012   TOTAL ABDOMINAL HYSTERECTOMY W/ BILATERAL OOPHORECTOMY AND LYSIS ADHESIONS  09/02/2007   TUBAL LIGATION Bilateral 1995   UMBILICAL HERNIA REPAIR  2003   unsure if she has mesh   VAGINAL HYSTERECTOMY N/A 08/18/2015   Procedure: Exam under Anesthesia, Excision Xenform Graft, Removal Bilateral Sacrospinous Ligament sutures;  Surgeon: Patton Salles, MD;  Location: [facility];  Service: Gynecology;  Laterality: N/A;    Family History  Problem Relation Age of Onset   Kidney disease Mother    Hypertension Mother    Colon polyps Mother    Heart disease Mother    Depression Mother    Obesity Mother    Diabetes Father    Colon polyps Father    Drug abuse Sister    Heart attack Maternal Grandmother    Breast cancer Paternal Grandmother    Cancer Paternal Uncle        Pancreas   Pancreatic cancer Paternal Uncle    Esophageal cancer Neg Hx    Stomach cancer Neg Hx    Rectal cancer Neg Hx    Colon cancer Neg Hx      Current Outpatient Medications:    ALPRAZolam (XANAX) 1 MG tablet, Take 1 tablet (1 mg total) by mouth 2 (two) times daily as needed for anxiety., Disp: 60 tablet, Rfl: 5   doxycycline (VIBRAMYCIN) 100 MG capsule, Take 1 capsule (100 mg total) by mouth daily., Disp: 90 capsule,  Rfl: 3   estradiol (ESTRACE) 0.1 MG/GM vaginal cream, Place 0.5g nightly for two weeks then twice a week after, Disp: 30 g, Rfl: 11   Multiple Vitamin (MULTIVITAMIN WITH MINERALS) TABS tablet, Take 1 tablet by mouth daily. (Patient not taking: Reported on 12/23/2023), Disp: , Rfl:    omeprazole (PRILOSEC) 40 MG capsule, Take 1 capsule (40 mg total) by mouth daily. (Patient not taking: Reported on 01/20/2024), Disp: 30 capsule, Rfl: 5   phentermine 37.5 MG capsule, Take 1 capsule by mouth in the morning (Patient not taking: Reported on 01/20/2024), Disp: 30 capsule, Rfl: 5   pramipexole (MIRAPEX) 1 MG tablet, Take 1 tablet (1 mg total) by mouth in the morning and at bedtime., Disp: 60 tablet, Rfl: 11   topiramate (TOPAMAX) 50 MG tablet, Take 1 tablet (50 mg total) by mouth 2 (two) times daily. (Patient not taking: Reported on 12/23/2023), Disp: 60 tablet, Rfl: 5  EXAM:  VITALS per patient if applicable:  GENERAL: alert, oriented, appears well and in no acute distress  HEENT: atraumatic, conjunttiva clear, no obvious abnormalities on inspection of external nose and ears  NECK: normal movements of the head and neck  LUNGS: on inspection no signs of respiratory distress, breathing rate appears normal, no obvious gross SOB, gasping or wheezing  CV: no obvious cyanosis  MS: moves all visible extremities without noticeable abnormality  PSYCH/NEURO: pleasant and cooperative, no obvious depression or anxiety, speech and thought processing grossly intact  ASSESSMENT AND PLAN: For obesity and dyslipidemia, she will work on diet and exercise for now. I advised her to start taking a statin, but she wants to discuss this with the weight clinic first. We will refer her for a sleep evaluation. We will also get her back on Adderall XR 20 mg every morning for the ADHD. Follow up in one month. Gershon Crane, MD  Discussed the following assessment and plan:  No diagnosis found.     I discussed the  assessment and treatment plan with the patient. The patient was provided an opportunity to ask questions and all were answered. The patient agreed with the plan and demonstrated an understanding of the instructions.   The patient was advised to call back or seek an in-person evaluation if the symptoms worsen or if the condition fails to improve as anticipated.      Review of Systems     Objective:   Physical Exam        Assessment & Plan:

## 2024-01-28 ENCOUNTER — Ambulatory Visit (INDEPENDENT_AMBULATORY_CARE_PROVIDER_SITE_OTHER): Payer: Commercial Managed Care - PPO | Admitting: Psychology

## 2024-01-28 DIAGNOSIS — F411 Generalized anxiety disorder: Secondary | ICD-10-CM

## 2024-01-28 DIAGNOSIS — F4321 Adjustment disorder with depressed mood: Secondary | ICD-10-CM

## 2024-01-28 NOTE — Progress Notes (Signed)
  Behavioral Health Counselor/Therapist Progress Note  Patient ID: [name], MRN: [id number],    Date: 01/28/24   Time Spent: 11:03am-11:54am  Treatment Type: Individual Therapy  [name] is seen for a virtual video visit via caregility. [name] consents to virtual visit and is aware of limitations of virtual visits.  [name] joins from [name] home, reporting privacy, and counselor from [name] home office.     Reported Symptoms: [name] reports worry for mom's and mother-in law's health.  [name] reports less tearful and escalating emotions this past week.  [name] reports keeping good communication w/ husband.  Mental Status Exam: Appearance:  Well Groomed     Behavior: Appropriate  Motor: Normal  Speech/Language:  Normal Rate  Affect: Appropriate  Mood: anxious  Thought process: normal  Thought content:   WNL  Sensory/Perceptual disturbances:   WNL  Orientation: oriented to person, place, time/date, and situation  Attention: Good  Concentration: Good  Memory: WNL  Fund of knowledge:  Good  Insight:   Good  Judgment:  Good  Impulse Control: Good   Risk Assessment: Danger to Self:  No Self-injurious Behavior: No Danger to Others: No Duty to Warn:no Physical Aggression / Violence:No  Access to Firearms a concern: No  Gang Involvement:No   Subjective: Counselor assessed [name] current functioning per [name] report.  Processed w/[name] recent stressors and related worries. Explored [name] grief and acknowledging feelings and reframing distortions.  Discussed not internalizing husband's lack of expressing feelings w/ [name].  Explored ways to reframe and acknowledge support.  [name] affect wnl.  [name] reports that she has been less tearful related to grief over past couple weeks.  [name] reports that recently [name] mom and mother in law are back in the hospital w/ health concerns and waiting for rehab placement.  [name] express concern that same pattern of dad and was able to reframe related distortions.  [name] expressed some  hurt that husband shut down from expressing his emotions w/ [name] and recognizes that this is his pattern and not [name] fault. [name] reported PCP restarted [name] ADHD meds as struggling w/ focus on the job.    Interventions: Cognitive Behavioral Therapy, Mindfulness Meditation, and supportive  Diagnosis:Generalized anxiety disorder  Grief  Plan: [name] to f/u w/ Counseling in 2 weeks.  Focus on daily subtle movement and engagement w/ self care activities.   F/u as scheduled w/ PCP.      Individualized Treatment Plan Strengths: seeking counseling, enjoys getting nails done, enjoys walking and movement, enjoys the beach, family Sunday dinners  Supports: [name] husband, [name] daughter in law and [name] mom    Goal/Needs for Treatment:  In order of importance to patient 1) continue to cope with anxiety 2) increase self worth      Client Statement of Needs: focus on my self and continue building my self esteem and feel better about myself and confidence. Continue to maintain progress w/ coping w/ anxiety, being able to use mindfulness and deescalate.   07/11/23 return to counseling to help get my anxiety under control again    Treatment Level: outpatient counseling  Symptoms:anxiety, worry, ruminating thoughts, low self worth, emotional escalations, depressed mood, sleep disturbance  Client Treatment Preferences:Monthly counseling and continue medication management w/ PCP 07/11/23 increase to every 1-2 weeks, referral for psychiatrist to restart medication.    Healthcare consumer's goal for treatment:   Counselor, Damien Herald, [facility] will support the patient's ability to achieve the goals identified. Cognitive Behavioral Therapy, Assertive Communication/Conflict  Resolution Training, Relaxation Training, ACT, Humanistic and other evidenced-based practices will be used to promote progress towards healthy functioning.    Healthcare consumer will: Actively participate in therapy, working towards healthy  functioning.     *Justification for Continuation/Discontinuation of Goal: R=Revised, O=Ongoing, A=Achieved, D=Discontinued   Goal 1) [name] will increase coping with life's anxiety and stressors AEB decreased anxiety, decreased emotional escalations, decreased sleep disturbance, increased awareness of thought patterns that impact anxiety. Baseline date 03/04/23: Progress towards goal 30%; How Often - Daily Target Date Goal Was reviewed Status Code Progress towards goal  03/03/24  07/11/23  O  returning to counseling    03/03/24                   Goal 2)  Increase self awareness, identifying [name] likes/values/interest and increase feeling positive self worth w/ who she is as demonstrated by expressing and communicating in sessions.  Baseline date 03/04/23: Progress towards goal 25; How Often - Daily   Target Date Goal Was reviewed Status Code Progress towards goal/Likert rating  03/03/24  07/11/23  O  returning to counseling                      This plan has been reviewed and created by the following participants:  This plan will be reviewed at least every 12 months. Date Behavioral Health Clinician Date Guardian/Patient   03/04/23 [facility]bar[facility]                      03/04/23 Verbal consent provided  07/11/23 Damien MH Barbarann [facility]     07/11/23  verbal consent       BARBARANN DAMIEN [facility]

## 2024-01-30 ENCOUNTER — Encounter: Payer: Self-pay | Admitting: Family Medicine

## 2024-01-31 MED ORDER — AMPHETAMINE-DEXTROAMPHET ER 20 MG PO CP24: 20.0000 mg | ORAL_CAPSULE | ORAL | 0 refills | Status: DC

## 2024-01-31 NOTE — Telephone Encounter (Signed)
 I re-sent the Adderall XR

## 2024-02-03 ENCOUNTER — Encounter (INDEPENDENT_AMBULATORY_CARE_PROVIDER_SITE_OTHER): Payer: Self-pay | Admitting: Family Medicine

## 2024-02-03 ENCOUNTER — Ambulatory Visit (INDEPENDENT_AMBULATORY_CARE_PROVIDER_SITE_OTHER): Payer: Commercial Managed Care - PPO | Admitting: Family Medicine

## 2024-02-03 VITALS — BP 129/74 | HR 63 | Temp 97.6°F | Ht 66.0 in | Wt 184.0 lb

## 2024-02-03 DIAGNOSIS — K760 Fatty (change of) liver, not elsewhere classified: Secondary | ICD-10-CM

## 2024-02-03 DIAGNOSIS — R7303 Prediabetes: Secondary | ICD-10-CM

## 2024-02-03 DIAGNOSIS — E538 Deficiency of other specified B group vitamins: Secondary | ICD-10-CM

## 2024-02-03 DIAGNOSIS — E559 Vitamin D deficiency, unspecified: Secondary | ICD-10-CM

## 2024-02-03 DIAGNOSIS — Z8249 Family history of ischemic heart disease and other diseases of the circulatory system: Secondary | ICD-10-CM

## 2024-02-03 DIAGNOSIS — E782 Mixed hyperlipidemia: Secondary | ICD-10-CM | POA: Diagnosis not present

## 2024-02-03 DIAGNOSIS — Z6830 Body mass index (BMI) 30.0-30.9, adult: Secondary | ICD-10-CM

## 2024-02-03 DIAGNOSIS — E66811 Obesity, class 1: Secondary | ICD-10-CM

## 2024-02-03 MED ORDER — VITAMIN D (ERGOCALCIFEROL) 1.25 MG (50000 UNIT) PO CAPS: 50000.0000 [IU] | ORAL_CAPSULE | ORAL | 0 refills | Status: DC

## 2024-02-03 MED ORDER — CYANOCOBALAMIN 500 MCG PO TABS: ORAL_TABLET | ORAL | Status: DC

## 2024-02-03 NOTE — Progress Notes (Signed)
[name], D.O.  ABFM, ABOM Clinical Bariatric Medicine Physician  Office located at: [address] [place], [place]  [address]   Assessment and Plan:   FOR THE DISEASE OF OBESITY: Class 1 obesity with serious comorbidity and body mass index (BMI) of 30.0 to 30.9 in adult, unspecified obesity type Assessment & Plan: Since last office visit on 01/20/2024 patient's  Muscle mass has increased by 1 lb. Fat mass has decreased by 6.2 lb. Total body water has decreased by 0.2 lb.  Counseling done on how various foods will affect these numbers and how to maximize success  Total lbs lost to date: 5 lbs  Total weight loss percentage to date: 2.65%    Recommended Dietary Goals [name] is currently in the action stage of change. As such, her goal is to continue weight management plan.  She has agreed to: continue current plan - CAT 1 MP with B/L options. We may have her journal in the future.    Behavioral Intervention We discussed the following Behavioral Modification Strategies today: high fiber food,  increasing lean protein intake to established goals, increasing vegetables (leafy greens), and planning for success.  Additional resources provided today: Handout on CAT 1-2 breakfast options and Handout on CAT 1-2 lunch options  Evidence-based interventions for health behavior change were utilized today including the discussion of self monitoring techniques, problem-solving barriers and SMART goal setting techniques.   Regarding patient's less desirable eating habits and patterns, we employed the technique of small changes.   Pt will specifically work on: n/a   Recommended Physical Activity Goals [name] has been advised to work up to 150 minutes of moderate intensity aerobic activity a week and strengthening exercises 2-3 times per week for cardiovascular health, weight loss maintenance and preservation of muscle mass.   She has agreed to : Continue current level of physical  activity    Pharmacotherapy We both agreed to : continue with nutritional and behavioral strategies   FOR ASSOCIATED CONDITIONS ADDRESSED TODAY:  Prediabetes Assessment & Plan: Lab Results  Component Value Date   HGBA1C 5.5 01/20/2024   HGBA1C 5.7 09/04/2023   HGBA1C 5.6 01/21/2023   INSULIN 11.2 01/20/2024   Lab Results  Component Value Date   CREATININE 0.72 01/20/2024   BUN 13 01/20/2024   NA 143 01/20/2024   K 4.5 01/20/2024   CL 105 01/20/2024   CO2 24 01/20/2024   Lab Results  Component Value Date   WBC 4.7 01/20/2024   HGB 14.3 01/20/2024   HCT 42.3 01/20/2024   MCV 87 01/20/2024   PLT 253 01/20/2024   Lab Results  Component Value Date   TSH 2.190 01/20/2024   FREET4 1.09 01/20/2024   Hemoglobin A1c stable at 5.5. Fasting insulin is elevated at 11.2. Goal <5. Blood counts, renal parameters, and thyroid levels are WNL.   Patient aware of disease state. Explained role of simple carbs and insulin levels on hunger and cravings. Educated patient that having adequate amounts of protein with each meal is important for increasing muscle mass, stabilizing sugars, controlling hunger and cravings, and improving thermogenesis. Additionally, I recommended pt to drink approximately half of their body weight in ounces of water daily. Additionally, have an extra bottle of water for every 30 minutes of exercise. Continue RCNP.   Mixed hyperlipidemia Family Hx of early CAD Assessment & Plan: Lab Results  Component Value Date   CHOL 213 (H) 01/20/2024   HDL 54 01/20/2024   LDLCALC 141 (  H) 01/20/2024   LDLDIRECT 118.0 12/28/2019   TRIG 100 01/20/2024   CHOLHDL 3 09/04/2023   The 10-year ASCVD risk score (Arnett DK, et al., 2019) is: 2.9%   Values used to calculate the score:     Age: 58 years     Sex: Female     Is Non-Hispanic African American: No     Diabetic: No     Tobacco smoker: No     Systolic Blood Pressure: 129 mmHg     Is BP treated: No     HDL  Cholesterol: 54 mg/dL     Total Cholesterol: 213 mg/dL  Pt with family hx of heart disease. Her maternal grandmother passed away from a massive MI at 43 y.o. Pt's LDL is elevated at 141. Pt advised to reduce saturated and trans fats in diet. Advance exercise as able:  We recommend: aerobic activity with eventual goal of a minimum of 150+ min wk plus 2 days/ week of resistance or strength training. Will order CT cardiac scoring to determine if statin therapy is warranted.   Orders:  -     CT CARDIAC SCORING (SELF PAY ONLY); Future   Metabolic dysfunction-associated steatotic liver disease (MASLD) Assessment & Plan:    Component Value Date/Time   PROT 7.1 01/20/2024 0953   ALBUMIN 4.5 01/20/2024 0953   AST 15 01/20/2024 0953   ALT 13 01/20/2024 0953   ALKPHOS 101 01/20/2024 0953   BILITOT 0.3 01/20/2024 0953   BILIDIR 0.1 09/04/2023 0837    No concerns with liver function - liver enzymes are WNL. Continue to reduce saturated fats as well as simple and added sugars in their diet.  Also avoidance of alcohol. Continue weight loss therapy.    Vitamin D deficiency - new onset Assessment & Plan: Lab Results  Component Value Date   VD25OH 33.4 01/20/2024   VD25OH 37.16 04/04/2020   VD25OH 28.91 (L) 01/26/2020   Her vitamin D levels are below target. Goal is 50 to 70. I discussed the importance of vitamin D to the patient's health and well-being as well as to their ability to lose weight. Shared decision making: Start ERGO 50,000 units weekly. Recheck levels in 3 months.   Orders:  -     Vitamin D (Ergocalciferol); Take 1 capsule (50,000 Units total) by mouth every 7 (seven) days.  Dispense: 4 capsule; Refill: 0   Vitamin B12 insufficeny- new onset Assessment & Plan: Lab Results  Component Value Date   VITAMINB12 411 01/20/2024   FOLATE 6.6 01/20/2024    No concerns with Folate levels. B12 level is 411, not at goal of over 500. Pt instructed to begin OTC B12 500 mcg daily. Continue  to also focus on B12 rich foods. We will continue to monitor.   Orders:  -     Cyanocobalamin; 300 mcg- 500 mcg daily   FOLLOW UP:   Return 02/19/2024. She was informed of the importance of frequent follow up visits to maximize her success with intensive lifestyle modifications for her multiple health conditions.  Subjective:   Chief complaint: Obesity [name] is here to discuss her progress with her obesity treatment plan. She is on the Category 1 Plan and states she is following her eating plan approximately 95 % of the time. She states she is biking/weights 30 minutes 1 days per week.  Interval History:  [name] is here today for her first follow-up office visit since starting the program with [place].  Since last office  visit [name] is down 5 lbs. She is doing well with her meal plan but endorses that she is getting a bit bored. She denies skipping meals. Pt states that her husband owns a food truck and is sometimes tempted by the foods there such as the burgers and corn. Pt meeting with Dr.Barker tomorrow. Additionally, she will be having  a sleep study in the near future.   All blood work/ lab tests that were recently ordered by myself or an outside provider were reviewed with patient today per their request. Extended time was spent counseling her on all new disease processes that were discovered or preexisting ones that are affected by BMI.  she understands that many of these abnormalities will need to monitored regularly along with the current treatment plan of prudent dietary changes, in which we are making each and every office visit, to improve these health parameters.  Pharmacotherapy for weight loss: She is not currently taking medications  for medical weight loss.    Review of Systems:  Pertinent positives were addressed with patient today.  Reviewed by clinician on day of visit: allergies, medications, problem list, medical history, surgical history, family history,  social history, and previous encounter notes.  Weight Summary and Biometrics   Weight Lost Since Last Visit: 5 lb  Weight Gained Since Last Visit: 0 lb   Vitals Temp: 97.6 F (36.4 C) BP: 129/74 Pulse Rate: 63 SpO2: 100 %   Anthropometric Measurements Height: 5\' 6"  (1.676 m) Weight: 184 lb (83.5 kg) BMI (Calculated): 29.71 Weight at Last Visit: 189 lb Weight Lost Since Last Visit: 5 lb Weight Gained Since Last Visit: 0 lb Starting Weight: 189 lb Total Weight Loss (lbs): 5 lb (2.268 kg) Peak Weight: 250 lb   Body Composition  Body Fat %: 40.5 % Fat Mass (lbs): 74.6 lbs Muscle Mass (lbs): 103.8 lbs Total Body Water (lbs): 71.6 lbs Visceral Fat Rating : 10   Other Clinical Data RMR: [id number] Fasting: No Labs: No Today's Visit #: 2 Starting Date: 01/20/24   Objective:   PHYSICAL EXAM:  Blood pressure 129/74, pulse 63, temperature 97.6 F (36.4 C), height 5\' 6"  (1.676 m), weight 184 lb (83.5 kg), last menstrual period 12/24/2006, SpO2 100%. Body mass index is 29.7 kg/m.  General: she is overweight, cooperative and in no acute distress.   HEENT: EOMI, sclerae are anicteric. Lungs: Normal breathing effort, no conversational dyspnea. M-Sk:  Normal gross ROM * 4 extremities  PSYCH: Has normal mood, affect and thought process. Neurologic: No gross sensory or motor deficits. Well developed, A and O * 3  DIAGNOSTIC DATA REVIEWED:  BMET    Component Value Date/Time   NA 143 01/20/2024 0953   K 4.5 01/20/2024 0953   CL 105 01/20/2024 0953   CO2 24 01/20/2024 0953   GLUCOSE 97 01/20/2024 0953   GLUCOSE 95 09/04/2023 0837   BUN 13 01/20/2024 0953   CREATININE 0.72 01/20/2024 0953   CALCIUM 9.9 01/20/2024 0953   GFRNONAA >60 06/08/2022 2009   GFRAA >60 11/15/2017 2142   Lab Results  Component Value Date   HGBA1C 5.5 01/20/2024   HGBA1C 5.6 10/17/2015   Lab Results  Component Value Date   INSULIN 11.2 01/20/2024   Lab Results  Component Value Date    TSH 2.190 01/20/2024   CBC    Component Value Date/Time   WBC 4.7 01/20/2024 0953   WBC 4.9 09/04/2023 0837   RBC 4.87 01/20/2024 0953   RBC 4.63 09/04/2023 0837  HGB 14.3 01/20/2024 0953   HCT 42.3 01/20/2024 0953   PLT 253 01/20/2024 0953   MCV 87 01/20/2024 0953   MCH 29.4 01/20/2024 0953   MCH 28.4 06/08/2022 2009   MCHC 33.8 01/20/2024 0953   MCHC 32.6 09/04/2023 0837   RDW 12.8 01/20/2024 0953   Iron Studies    Component Value Date/Time   IRON 89 11/14/2022 1321   TIBC 326.2 11/14/2022 1321   FERRITIN 87.8 11/14/2022 1321   IRONPCTSAT 27.3 11/14/2022 1321   Lipid Panel     Component Value Date/Time   CHOL 213 (H) 01/20/2024 0953   TRIG 100 01/20/2024 0953   HDL 54 01/20/2024 0953   CHOLHDL 3 09/04/2023 0837   VLDL 17.0 09/04/2023 0837   LDLCALC 141 (H) 01/20/2024 0953   LDLDIRECT 118.0 12/28/2019 1323   Hepatic Function Panel     Component Value Date/Time   PROT 7.1 01/20/2024 0953   ALBUMIN 4.5 01/20/2024 0953   AST 15 01/20/2024 0953   ALT 13 01/20/2024 0953   ALKPHOS 101 01/20/2024 0953   BILITOT 0.3 01/20/2024 0953   BILIDIR 0.1 09/04/2023 0837      Component Value Date/Time   TSH 2.190 01/20/2024 0953   Nutritional Lab Results  Component Value Date   VD25OH 33.4 01/20/2024   VD25OH 37.16 04/04/2020   VD25OH 28.91 (L) 01/26/2020    Attestations:   Reviewed by clinician on day of visit: allergies, medications, problem list, medical history, surgical history, family history, social history, and previous encounter notes pertinent to patient's obesity diagnosis.   I have spent 56 minutes in the care of the patient today including: preparing to see patient (e.g. review and interpretation of tests, old notes ), obtaining and/or reviewing separately obtained history, performing a medically appropriate examination or evaluation, counseling and educating the patient, ordering medications, test or procedures, documenting clinical information in the  electronic or other health care record, and independently interpreting results and communicating results to the patient, family, or caregiver   I, Special Randolm Idol , acting as a Stage manager for Thomasene Lot, DO., have compiled all relevant documentation for today's office visit on behalf of Thomasene Lot, DO, while in the presence of [employer], DO.  I have reviewed the above documentation for accuracy and completeness, and I agree with the above. [name], D.O.  The 21st Century Cures Act was signed into law in 2016 which includes the topic of electronic health records.  This provides immediate access to information in MyChart.  This includes consultation notes, operative notes, office notes, lab results and pathology reports.  If you have any questions about what you read please let us know at your next visit so we can discuss your concerns and take corrective action if need be.  We are right here with you.

## 2024-02-03 NOTE — Patient Instructions (Signed)
 The 10-year ASCVD risk score (Arnett DK, et al., 2019) is: 2.9%   Values used to calculate the score:     Age: 58 years     Sex: Female     Is Non-Hispanic African American: No     Diabetic: No     Tobacco smoker: No     Systolic Blood Pressure: 129 mmHg     Is BP treated: No     HDL Cholesterol: 54 mg/dL     Total Cholesterol: 213 mg/dL

## 2024-02-04 ENCOUNTER — Telehealth (INDEPENDENT_AMBULATORY_CARE_PROVIDER_SITE_OTHER): Payer: Commercial Managed Care - PPO | Admitting: Psychology

## 2024-02-06 ENCOUNTER — Other Ambulatory Visit: Payer: Self-pay

## 2024-02-07 ENCOUNTER — Ambulatory Visit: Payer: Self-pay | Admitting: Gastroenterology

## 2024-02-10 ENCOUNTER — Encounter (INDEPENDENT_AMBULATORY_CARE_PROVIDER_SITE_OTHER): Payer: Self-pay | Admitting: Family Medicine

## 2024-02-10 ENCOUNTER — Ambulatory Visit: Payer: Self-pay

## 2024-02-10 ENCOUNTER — Ambulatory Visit
Admission: RE | Admit: 2024-02-10 | Discharge: 2024-02-10 | Disposition: A | Discharge status: Home / Self Care | Payer: Self-pay | Source: Ambulatory Visit | Attending: Family Medicine | Admitting: Family Medicine

## 2024-02-10 DIAGNOSIS — Z8249 Family history of ischemic heart disease and other diseases of the circulatory system: Secondary | ICD-10-CM | POA: Insufficient documentation

## 2024-02-10 DIAGNOSIS — K760 Fatty (change of) liver, not elsewhere classified: Secondary | ICD-10-CM | POA: Insufficient documentation

## 2024-02-10 DIAGNOSIS — R7303 Prediabetes: Secondary | ICD-10-CM | POA: Insufficient documentation

## 2024-02-10 DIAGNOSIS — E782 Mixed hyperlipidemia: Secondary | ICD-10-CM | POA: Insufficient documentation

## 2024-02-13 ENCOUNTER — Telehealth (INDEPENDENT_AMBULATORY_CARE_PROVIDER_SITE_OTHER): Payer: Commercial Managed Care - PPO | Admitting: Family Medicine

## 2024-02-13 ENCOUNTER — Encounter: Payer: Self-pay | Admitting: Family Medicine

## 2024-02-13 DIAGNOSIS — E781 Pure hyperglyceridemia: Secondary | ICD-10-CM | POA: Diagnosis not present

## 2024-02-13 DIAGNOSIS — R931 Abnormal findings on diagnostic imaging of heart and coronary circulation: Secondary | ICD-10-CM

## 2024-02-13 DIAGNOSIS — Z136 Encounter for screening for cardiovascular disorders: Secondary | ICD-10-CM

## 2024-02-13 MED ORDER — ATORVASTATIN CALCIUM 20 MG PO TABS: 20.0000 mg | ORAL_TABLET | Freq: Every day | ORAL | 3 refills | Status: DC

## 2024-02-13 MED ORDER — ASPIRIN 81 MG PO TBEC: 81.0000 mg | DELAYED_RELEASE_TABLET | Freq: Every day | ORAL | 0 refills | Status: AC

## 2024-02-13 NOTE — Progress Notes (Signed)
Subjective:    Patient ID: [name], female    DOB: 01-02-1966, 58 y.o.   MRN: [id number]  HPI Virtual Visit via Video Note  I connected with the patient on 02/13/24 at 10:00 AM EST by a video enabled telemedicine application and verified that I am speaking with the correct person using two identifiers.  Location patient: home Location provider:work or home office Persons participating in the virtual visit: patient, provider  I discussed the limitations of evaluation and management by telemedicine and the availability of in person appointments. The patient expressed understanding and agreed to proceed.   HPI: Here with questions about recent test results that were ordered by her weight management team. She had labs drawn showing an LDL of 141 and she had a cardiac CT calcium score of 843. She asks for advice. She is already eating ahealthy diet and exercising.    ROS: See pertinent positives and negatives per HPI.  Past Medical History:  Diagnosis Date   ADHD    Allergy    Anxiety    Back pain    Constipated    Depression    Fatty liver    GERD (gastroesophageal reflux disease)    History of abnormal cervical Pap smear    1991 -- 1995--hx cryotherapy to cervix   History of colon polyps    2008- BENIGN   Hydronephrosis, left    Kidney stones 2016   PONV (postoperative nausea and vomiting)    RLS (restless legs syndrome)    Rosacea    Vitamin D deficiency     Past Surgical History:  Procedure Laterality Date   ABDOMINAL HYSTERECTOMY     ANTERIOR AND POSTERIOR REPAIR WITH SACROSPINOUS FIXATION N/A 08/16/2015   Procedure: ANTERIOR COLPORRHAPHY WITH XENOFORM GRAFT AND SACROSPINOUS FIXATION;  Surgeon: Patton Salles, MD;  Location: [facility];  Service: Gynecology;  Laterality: N/A;  2.5 hours OR time   BLADDER SUSPENSION N/A 08/16/2015   Procedure: TRANSVAGINAL TAPE (TVT) PROCEDURE exact midurethral sling;  Surgeon: Patton Salles, MD;   Location: [facility];  Service: Gynecology;  Laterality: N/A;   CHOLECYSTECTOMY  1998   COLONOSCOPY  08/21/2017   per Dr. Christella Hartigan, clear, repeat in 10 yrs   CYSTOCELE REPAIR N/A 12/12/2022   Procedure: ANTERIOR REPAIR (CYSTOCELE);  Surgeon: Marguerita Beards, MD;  Location: [facility];  Service: Gynecology;  Laterality: N/A;   CYSTOSCOPY N/A 08/16/2015   Procedure: CYSTOSCOPY;  Surgeon: Patton Salles, MD;  Location: [facility];  Service: Gynecology;  Laterality: N/A;   CYSTOSCOPY N/A 12/12/2022   Procedure: CYSTOSCOPY;  Surgeon: Marguerita Beards, MD;  Location: [facility];  Service: Gynecology;  Laterality: N/A;   CYSTOSCOPY W/ RETROGRADES Bilateral 06/22/2015   Procedure: CYSTOSCOPY WITH RETROGRADE PYELOGRAM;  Surgeon: Malen Gauze, MD;  Location: [facility];  Service: Urology;  Laterality: Bilateral;   CYSTOSCOPY W/ URETERAL STENT PLACEMENT  01/1999   CYSTOSCOPY WITH HOLMIUM LASER LITHOTRIPSY Left 05/18/2015   Procedure: CYSTOSCOPY WITH HOLMIUM LASER LITHOTRIPSY;  Surgeon: Sebastian Ache, MD;  Location: [facility];  Service: Urology;  Laterality: Left;   CYSTOSCOPY WITH RETROGRADE PYELOGRAM, URETEROSCOPY AND STENT PLACEMENT Left 06/22/2015   Procedure: CYSTOSCOPY,  LEFT URETEROSCOPY;  Surgeon: Malen Gauze, MD;  Location: [facility];  Service: Urology;  Laterality: Left;   CYSTOSCOPY WITH URETEROSCOPY AND STENT PLACEMENT Left 05/18/2015   Procedure: CYSTOSCOPY WITH URETEROSCOPY, STONE MANIPULATION  AND STENT PLACEMENT;  Surgeon: Sebastian Ache, MD;  Location[facility];  Service: Urology;  Laterality: Left;   DIAGNOSTIC LAPAROSCOPY     DX LAPAROSCOPY  X2   ESOPHAGOGASTRODUODENOSCOPY  05/09/2007   EXPLORATORY LAPAROTOMY W/ BILATERAL SALPINGECTOMY AND REMOVAL RIGHT ECTOPIC PREG.  02/23/2004   EXTRACORPOREAL SHOCK WAVE LITHOTRIPSY  2001  &  2002   LAPAROSCOPIC CHOLECYSTECTOMY   1999   POLYPECTOMY     ROBOTIC ASSISTED LAPAROSCOPIC SACROCOLPOPEXY N/A 12/12/2022   Procedure: XI ROBOTIC ASSISTED LAPAROSCOPIC SACROCOLPOPEXY;  Surgeon: Marguerita Beards, MD;  Location: [facility];  Service: Gynecology;  Laterality: N/A;  Total time requested 3 hours -Assist requested   SHOULDER ARTHROSCOPY WITH OPEN ROTATOR CUFF REPAIR Right 2012   TOTAL ABDOMINAL HYSTERECTOMY W/ BILATERAL OOPHORECTOMY AND LYSIS ADHESIONS  09/02/2007   TUBAL LIGATION Bilateral 1995   UMBILICAL HERNIA REPAIR  2003   unsure if she has mesh   VAGINAL HYSTERECTOMY N/A 08/18/2015   Procedure: Exam under Anesthesia, Excision Xenform Graft, Removal Bilateral Sacrospinous Ligament sutures;  Surgeon: Patton Salles, MD;  Location: [facility];  Service: Gynecology;  Laterality: N/A;    Family History  Problem Relation Age of Onset   Kidney disease Mother    Hypertension Mother    Colon polyps Mother    Heart disease Mother    Depression Mother    Obesity Mother    Diabetes Father    Colon polyps Father    Drug abuse Sister    Heart attack Maternal Grandmother    Breast cancer Paternal Grandmother    Cancer Paternal Uncle        Pancreas   Pancreatic cancer Paternal Uncle    Esophageal cancer Neg Hx    Stomach cancer Neg Hx    Rectal cancer Neg Hx    Colon cancer Neg Hx      Current Outpatient Medications:    ALPRAZolam (XANAX) 1 MG tablet, Take 1 tablet (1 mg total) by mouth 2 (two) times daily as needed for anxiety., Disp: 60 tablet, Rfl: 5   amphetamine-dextroamphetamine (ADDERALL XR) 20 MG 24 hr capsule, Take 1 capsule (20 mg total) by mouth every morning., Disp: 30 capsule, Rfl: 0   cyanocobalamin (VITAMIN B12) 500 MCG tablet, 300 mcg- 500 mcg daily, Disp: , Rfl:    estradiol (ESTRACE) 0.1 MG/GM vaginal cream, Place 0.5g nightly for two weeks then twice a week after, Disp: 30 g, Rfl: 11   pramipexole (MIRAPEX) 1 MG tablet, Take 1 tablet (1 mg total) by mouth in the  morning and at bedtime., Disp: 60 tablet, Rfl: 11   Vitamin D, Ergocalciferol, (DRISDOL) 1.25 MG (50000 UNIT) CAPS capsule, Take 1 capsule (50,000 Units total) by mouth every 7 (seven) days., Disp: 4 capsule, Rfl: 0  EXAM:  VITALS per patient if applicable:  GENERAL: alert, oriented, appears well and in no acute distress  HEENT: atraumatic, conjunttiva clear, no obvious abnormalities on inspection of external nose and ears  NECK: normal movements of the head and neck  LUNGS: on inspection no signs of respiratory distress, breathing rate appears normal, no obvious gross SOB, gasping or wheezing  CV: no obvious cyanosis  MS: moves all visible extremities without noticeable abnormality  PSYCH/NEURO: pleasant and cooperative, no obvious depression or anxiety, speech and thought processing grossly intact  ASSESSMENT AND PLAN: She has significant risk factors for CAD based on recent test results. She will start taking Lipitor 20 mg daily and taking ASA  81 mg daily. We will refer her to Cardiology to evaluate.  Gershon Crane, MD  Discussed the following assessment and plan:  No diagnosis found.     I discussed the assessment and treatment plan with the patient. The patient was provided an opportunity to ask questions and all were answered. The patient agreed with the plan and demonstrated an understanding of the instructions.   The patient was advised to call back or seek an in-person evaluation if the symptoms worsen or if the condition fails to improve as anticipated.      Review of Systems     Objective:   Physical Exam        Assessment & Plan:

## 2024-02-14 ENCOUNTER — Encounter: Payer: Self-pay | Admitting: Sleep Medicine

## 2024-02-14 ENCOUNTER — Ambulatory Visit (INDEPENDENT_AMBULATORY_CARE_PROVIDER_SITE_OTHER): Payer: Commercial Managed Care - PPO | Admitting: Sleep Medicine

## 2024-02-14 VITALS — BP 106/66 | HR 72 | Temp 97.8°F | Ht 66.0 in | Wt 185.0 lb

## 2024-02-14 DIAGNOSIS — R0683 Snoring: Secondary | ICD-10-CM

## 2024-02-14 DIAGNOSIS — F419 Anxiety disorder, unspecified: Secondary | ICD-10-CM

## 2024-02-14 DIAGNOSIS — G2581 Restless legs syndrome: Secondary | ICD-10-CM | POA: Diagnosis not present

## 2024-02-14 DIAGNOSIS — G4733 Obstructive sleep apnea (adult) (pediatric): Secondary | ICD-10-CM

## 2024-02-14 NOTE — Progress Notes (Signed)
Name:[name] MRN: [id number] DOB: 1966-07-07   CHIEF COMPLAINT:  EXCESSIVE DAYTIME SLEEPINESS   HISTORY OF PRESENT ILLNESS:  [name] is a 58 y.o. w/ a h/o anxiety, ADHD and RLS who presents for c/o loud snoring and excessive daytime sleepiness which has been present for several years. Reports nocturnal awakenings due to unclear reasons, however does not have difficulty falling back to sleep. Reports a 15 lb weight gain over the last few years. Admits to morning headaches and RLS symptoms. Denies night sweats, dry mouth or dream enactment. Denies a family history of sleep apnea. Denies drowsy driving. Drinks 2-3 cups of coffee day, denies alcohol, tobacco or illicit drug use.   Bedtime 10 pm-12 am Sleep onset 15-30 mins Rise time 7:30 am   EPWORTH SLEEP SCORE 8     No data to display         PAST MEDICAL HISTORY :   has a past medical history of ADHD, Allergy, Anxiety, Back pain, Constipated, Depression, Fatty liver, GERD (gastroesophageal reflux disease), History of abnormal cervical Pap smear, History of colon polyps, Hydronephrosis, left, Kidney stones (2016), PONV (postoperative nausea and vomiting), RLS (restless legs syndrome), Rosacea, and Vitamin D deficiency.  has a past surgical history that includes Laparoscopic cholecystectomy (1999); Tubal ligation (Bilateral, 1995); Cystoscopy w/ ureteral stent placement (01/1999); Extracorporeal shock wave lithotripsy (2001  &  2002); DX LAPAROSCOPY (X2); EXPLORATORY LAPAROTOMY W/ BILATERAL SALPINGECTOMY AND REMOVAL RIGHT ECTOPIC PREG. (02/23/2004); TOTAL ABDOMINAL HYSTERECTOMY W/ BILATERAL OOPHORECTOMY AND LYSIS ADHESIONS (09/02/2007); Esophagogastroduodenoscopy (05/09/2007); Umbilical hernia repair (2003); Shoulder arthroscopy with open rotator cuff repair (Right, 2012); Cystoscopy with ureteroscopy and stent placement (Left, 05/18/2015); Cystoscopy with holmium laser lithotripsy (Left, 05/18/2015); Cystoscopy with  retrograde pyelogram, ureteroscopy and stent placement (Left, 06/22/2015); Cystoscopy w/ retrogrades (Bilateral, 06/22/2015); Abdominal hysterectomy; Diagnostic laparoscopy; Anterior and posterior repair with sacrospinous fixation (N/A, 08/16/2015); Bladder suspension (N/A, 08/16/2015); Cystoscopy (N/A, 08/16/2015); Vaginal hysterectomy (N/A, 08/18/2015); Polypectomy; Colonoscopy (08/21/2017); Robotic assisted laparoscopic sacrocolpopexy (N/A, 12/12/2022); Cystoscopy (N/A, 12/12/2022); Cystocele repair (N/A, 12/12/2022); and Cholecystectomy (1998). Prior to Admission medications   Medication Sig Start Date End Date Taking? Authorizing Provider  ALPRAZolam Prudy Feeler) 1 MG tablet Take 1 tablet (1 mg total) by mouth 2 (two) times daily as needed for anxiety. 07/03/23   Nelwyn Salisbury, MD  amphetamine-dextroamphetamine (ADDERALL XR) 20 MG 24 hr capsule Take 1 capsule (20 mg total) by mouth every morning. 01/31/24   Nelwyn Salisbury, MD  aspirin EC 81 MG tablet Take 1 tablet (81 mg total) by mouth daily. Swallow whole. 02/13/24   Nelwyn Salisbury, MD  atorvastatin (LIPITOR) 20 MG tablet Take 1 tablet (20 mg total) by mouth daily. 02/13/24   Nelwyn Salisbury, MD  cyanocobalamin (VITAMIN B12) 500 MCG tablet 300 mcg- 500 mcg daily 02/03/24   Thomasene Lot, DO  estradiol (ESTRACE) 0.1 MG/GM vaginal cream Place 0.5g nightly for two weeks then twice a week after 08/12/23   Marguerita Beards, MD  pramipexole (MIRAPEX) 1 MG tablet Take 1 tablet (1 mg total) by mouth in the morning and at bedtime. 09/04/23   Nelwyn Salisbury, MD  Vitamin D, Ergocalciferol, (DRISDOL) 1.25 MG (50000 UNIT) CAPS capsule Take 1 capsule (50,000 Units total) by mouth every 7 (seven) days. 02/03/24   Thomasene Lot, DO   Allergies  Allergen Reactions   Desvenlafaxine Other (See Comments)    Reaction:  Withdrawal    Prochlorperazine Other (See Comments)    Altered mental  status   Iodinated Contrast Media Rash and Other (See Comments)    Previously  mis-entered as Iohexol allergy. Patient is not allergic to Non-Ionic contrast currently.    FAMILY HISTORY:  family history includes Breast cancer in her paternal grandmother; Cancer in her paternal uncle; Colon polyps in her father and mother; Depression in her mother; Diabetes in her father; Drug abuse in her sister; Heart attack in her maternal grandmother; Heart disease in her mother; Hypertension in her mother; Kidney disease in her mother; Obesity in her mother; Pancreatic cancer in her paternal uncle. SOCIAL HISTORY:  reports that she quit smoking about 15 years ago. Her smoking use included cigarettes. She started smoking about 30 years ago. She has never used smokeless tobacco. She reports that she does not drink alcohol and does not use drugs.   Review of Systems:  Gen:  Denies  fever, sweats, chills weight loss  HEENT: Denies blurred vision, double vision, ear pain, eye pain, hearing loss, nose bleeds, sore throat Cardiac:  No dizziness, chest pain or heaviness, chest tightness,edema, No JVD Resp:   No cough, -sputum production, -shortness of breath,-wheezing, -hemoptysis,  Gi: Denies swallowing difficulty, stomach pain, nausea or vomiting, diarrhea, constipation, bowel incontinence Gu:  Denies bladder incontinence, burning urine Ext:   Denies Joint pain, stiffness or swelling Skin: Denies  skin rash, easy bruising or bleeding or hives Endoc:  Denies polyuria, polydipsia , polyphagia or weight change Psych:   Denies depression, insomnia or hallucinations  Other:  All other systems negative  VITAL SIGNS: BP 106/66 (BP Location: Left Arm, Patient Position: Sitting, Cuff Size: Normal)   Pulse 72   Temp 97.8 F (36.6 C) (Temporal)   Ht 5\' 6"  (1.676 m)   Wt 185 lb (83.9 kg)   LMP 12/24/2006 (Approximate)   SpO2 100%   BMI 29.86 kg/m     Physical Examination:   General Appearance: No distress  EYES PERRLA, EOM intact.   NECK Supple, No JVD Pulmonary: normal breath  sounds, No wheezing.  CardiovascularNormal S1,S2.  No m/r/g.   Abdomen: Benign, Soft, non-tender. Skin:   warm, no rashes, no ecchymosis  Extremities: normal, no cyanosis, clubbing. Neuro:without focal findings,  speech normal  PSYCHIATRIC: Mood, affect within normal limits.   ASSESSMENT AND PLAN  OSA I suspect that OSA is likely present due to clinical presentation. Discussed the consequences of untreated sleep apnea. Advised not to drive drowsy for safety of patient and others. Will complete further evaluation with a home sleep study and follow up to review results.    RLS Stable, on current management. Following with PCP.   Anxiety Stable, on current management.    MEDICATION ADJUSTMENTS/LABS AND TESTS ORDERED: Recommend Home Sleep Study   Patient  satisfied with Plan of action and management. All questions answered  Follow up to review HST results and treatment plan.   I spent a total of 30 minutes reviewing chart data, face-to-face evaluation with the patient, counseling and coordination of care as detailed above.    Tempie Hoist, [name]  Sleep Medicine [facility]

## 2024-02-14 NOTE — Patient Instructions (Signed)
 Will complete a home sleep study and follow up to review results. Do not drive drowsy for safety of yourself and others.

## 2024-02-18 ENCOUNTER — Ambulatory Visit (INDEPENDENT_AMBULATORY_CARE_PROVIDER_SITE_OTHER): Payer: Commercial Managed Care - PPO | Admitting: Psychology

## 2024-02-18 DIAGNOSIS — F4321 Adjustment disorder with depressed mood: Secondary | ICD-10-CM

## 2024-02-18 DIAGNOSIS — F411 Generalized anxiety disorder: Secondary | ICD-10-CM | POA: Diagnosis not present

## 2024-02-18 NOTE — Progress Notes (Signed)
 [facility] Counselor/Therapist Progress Note  Patient ID: [name], MRN: [id number],    Date: 02/18/24   Time Spent: 12:00pm-12:50pm  Treatment Type: Individual Therapy  Pt is seen for a virtual video visit via caregility. Pt consents to virtual visit and is aware of limitations of virtual visits.  Pt joins from her home, reporting privacy, and counselor from her home office.     Reported Symptoms: pt reports worry for mom's, mother-in law's and own health.  Pt reports continued decreased emotional lability.    Mental Status Exam: Appearance:  Well Groomed     Behavior: Appropriate  Motor: Normal  Speech/Language:  Normal Rate  Affect: Appropriate  Mood: anxious  Thought process: normal  Thought content:   WNL  Sensory/Perceptual disturbances:   WNL  Orientation: oriented to person, place, time/date, and situation  Attention: Good  Concentration: Good  Memory: WNL  Fund of knowledge:  Good  Insight:   Good  Judgment:  Good  Impulse Control: Good   Risk Assessment: Danger to Self:  No Self-injurious Behavior: No Danger to Others: No Duty to Warn:no Physical Aggression / Violence:No  Access to Firearms a concern: No  Gang Involvement:No   Subjective: Counselor assessed pt current functioning per pt report.  Processed w/pt recent stressors and related worries. Validated and normalized anxiety and worry and assisted in focus on not ruminating on and self care to manage.  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   Pt affect wnl.  Pt reports on less emotional lability and less tearful episodes.  Pt reports worry for mom w/ transitioning back home from rehab, w/ mother in law's return to hospital again and her own heart health issues discovered.  Pt recognize ways to keep rumination in check and be present to day to day.  Pt discussed how focus improved w/ ADHD meds.  Pt reports that she is trying to find balance  for work, helping husband's business and still preserving time for herself.      Interventions: Cognitive Behavioral Therapy, Mindfulness Meditation, and supportive  Diagnosis:Generalized anxiety disorder  Grief  Plan: Pt to f/u w/ Counseling in 2 weeks.  Focus on daily subtle movement and engagement w/ self care activities.   F/u as scheduled w/ PCP.      Individualized Treatment Plan Strengths: seeking counseling, enjoys getting nails done, enjoys walking and movement, enjoys the beach, family Sunday dinners  Supports: her husband, her daughter in law and her mom    Goal/Needs for Treatment:  In order of importance to patient 1) continue to cope with anxiety 2) increase self worth      Client Statement of Needs: "focus on my self and continue building my self esteem and feel better about myself and confidence. Continue to maintain progress w/ coping w/ anxiety, being able to use mindfulness and deescalate.   07/11/23 "return to counseling to help get my anxiety under control again"    Treatment Level: outpatient counseling  Symptoms:anxiety, worry, ruminating thoughts, low self worth, emotional escalations, depressed mood, sleep disturbance  Client Treatment Preferences:Monthly counseling and continue medication management w/ PCP 07/11/23 increase to every 1-2 weeks, referral for psychiatrist to restart medication.    Healthcare consumer's goal for treatment:   Counselor, Forde Radon, [facility] will support the patient's ability to achieve the goals identified. Cognitive Behavioral Therapy, Assertive Communication/Conflict Resolution Training, Relaxation Training, ACT, Humanistic and other evidenced-based practices will be used to  promote progress towards healthy functioning.    Healthcare consumer will: Actively participate in therapy, working towards healthy functioning.     *Justification for Continuation/Discontinuation of Goal: R=Revised, O=Ongoing, A=Achieved, D=Discontinued    Goal 1) Pt will increase coping with life's anxiety and stressors AEB decreased anxiety, decreased emotional escalations, decreased sleep disturbance, increased awareness of thought patterns that impact anxiety. Baseline date 03/04/23: Progress towards goal 30%; How Often - Daily Target Date Goal Was reviewed Status Code Progress towards goal  03/03/24  07/11/23  O  returning to counseling    03/03/24                   Goal 2)  Increase self awareness, identifying her likes/values/interest and increase feeling positive self worth w/ who she is as demonstrated by expressing and communicating in sessions.  Baseline date 03/04/23: Progress towards goal 25; How Often - Daily   Target Date Goal Was reviewed Status Code Progress towards goal/Likert rating  03/03/24  07/11/23  O  returning to counseling                      This plan has been reviewed and created by the following participants:  This plan will be reviewed at least every 12 months. Date Behavioral Health Clinician Date Guardian/Patient   03/04/23 [name]                      03/04/23 Verbal consent provided  07/11/23 [name] [facility] [name]     07/11/23  verbal consent       Forde Radon [facility]               [place], [facility]

## 2024-02-19 ENCOUNTER — Encounter (INDEPENDENT_AMBULATORY_CARE_PROVIDER_SITE_OTHER): Payer: Self-pay | Admitting: Family Medicine

## 2024-02-19 ENCOUNTER — Ambulatory Visit (INDEPENDENT_AMBULATORY_CARE_PROVIDER_SITE_OTHER): Payer: Commercial Managed Care - PPO | Admitting: Family Medicine

## 2024-02-19 VITALS — BP 130/80 | HR 67 | Temp 97.8°F | Ht 66.0 in | Wt 180.0 lb

## 2024-02-19 DIAGNOSIS — E669 Obesity, unspecified: Secondary | ICD-10-CM

## 2024-02-19 DIAGNOSIS — R7303 Prediabetes: Secondary | ICD-10-CM | POA: Diagnosis not present

## 2024-02-19 DIAGNOSIS — Z6829 Body mass index (BMI) 29.0-29.9, adult: Secondary | ICD-10-CM

## 2024-02-19 DIAGNOSIS — E559 Vitamin D deficiency, unspecified: Secondary | ICD-10-CM | POA: Diagnosis not present

## 2024-02-19 DIAGNOSIS — Z136 Encounter for screening for cardiovascular disorders: Secondary | ICD-10-CM | POA: Diagnosis not present

## 2024-02-19 DIAGNOSIS — Z6841 Body Mass Index (BMI) 40.0 and over, adult: Secondary | ICD-10-CM

## 2024-02-19 DIAGNOSIS — E538 Deficiency of other specified B group vitamins: Secondary | ICD-10-CM

## 2024-02-19 DIAGNOSIS — Z9189 Other specified personal risk factors, not elsewhere classified: Secondary | ICD-10-CM

## 2024-02-19 DIAGNOSIS — Z8249 Family history of ischemic heart disease and other diseases of the circulatory system: Secondary | ICD-10-CM

## 2024-02-19 MED ORDER — VITAMIN D (ERGOCALCIFEROL) 1.25 MG (50000 UNIT) PO CAPS: 50000.0000 [IU] | ORAL_CAPSULE | ORAL | 0 refills | Status: DC

## 2024-02-19 NOTE — Progress Notes (Incomplete)
Carlye Grippe, D.O.  ABFM, ABOM Specializing in Clinical Bariatric Medicine  Office located at: [address] [place], [place]  [address]   Assessment and Plan:   FOR THE DISEASE OF OBESITY: BMI 25.0-29.9, adult -- current bmi 29.07 Obesity (BMI 30-39.9) - Starting BMI 30.52 Assessment & Plan: Since last office visit on 02/03/2024 patient's  Muscle mass has decreased by 1.6 lbs. Fat mass has decreased by 1.4 lbs. Total body water has increased by 0.6 lbs.  Counseling done on how various foods will affect these numbers and how to maximize success  Total lbs lost to date: 9 lbs Total weight loss percentage to date: 4.76%    Recommended Dietary Goals [name] is currently in the action stage of change. As such, her goal is to continue weight management plan.  She has agreed to: continue current plan   Behavioral Intervention We discussed the following today: Protein options that aren't meat (e.g. egg whites, lentils, etc.)   Additional resources provided today: handout on CAT 1 meal plan , Handout on non-starchy vegetables, Handout on healthy eating principles  Evidence-based interventions for health behavior change were utilized today including the discussion of self monitoring techniques, problem-solving barriers and SMART goal setting techniques.   Regarding patient's less desirable eating habits and patterns, we employed the technique of small changes.   Pt will specifically work on: n/a   Recommended Physical Activity Goals [name] has been advised to work up to 150 minutes of moderate intensity aerobic activity a week and strengthening exercises 2-3 times per week for cardiovascular health, weight loss maintenance and preservation of muscle mass.   She has agreed to :  Continue current level of physical activity    Pharmacotherapy We both agreed to : continue with nutritional and behavioral strategies   FOR ASSOCIATED CONDITIONS ADDRESSED  TODAY:  Prediabetes Assessment & Plan: Not currently on any medications. Diet/exercise approach. Handouts provided at pt's request after education provided.  All concerns/questions addressed. [name] will continue to work on weight loss, exercise, via her meal plan we devised to help decrease the risk of progressing to diabetes. We will recheck A1c and fasting insulin level 3 months from last obtained results.   Encounter for screening for coronary artery disease in patient with risk for coronary artery disease greater than 20% in next 10 years Assessment & Plan:    CT received on 02/10/2024 -- CT cardiac score of 843 Started Crestor 2/21 Results show she is following  Must increase exercise meal plan properly Maternal grandmother died of massive MI at 3  Call [facility] -- a consult was put in on 2/20 and would like to know the status of this request  1. Coronary calcium score of 843. This was 99th percentile for age and sex matched control.   2. CAC >300 in LAD, RCA. CAC-DRS A3/N2.   3. Recommend aspirin and statin if no contraindication.   4. Recommend cardiology consultation.   5. Continue heart healthy lifestyle and risk factor modification.    Vitamin D deficiency  Assessment & Plan: Pt is treating with ERGO 50K weekly. Pt is tolerating well. No concerns in this regard today. Will refill today.    Vitamin B12 insufficency Assessment & Plan: [name] is treating Vit B12 deficiency with Cyanocobalamin 300-500 mcg daily. She is compliant with this medication. Continue current medication regimen.   Follow up:   Return in about 22 days (around 03/12/2024). She was informed of the importance of frequent follow up  visits to maximize her success with intensive lifestyle modifications for her multiple health conditions.  Subjective:   Chief complaint: Obesity [name] is here to discuss her progress with her obesity treatment plan. She is on the the Category 1 Plan with B/L  options and states she is following her eating plan approximately 90% of the time. She states she is biking 15 minutes 3 days per week.  Interval History:  [name] is here for a follow up office visit. Since last OV on ,  ***    So much protein sometimes Been weighing meals More intentional with calorie intake for example with adding sweetener to coffee    Pharmacotherapy for weight loss: She is currently taking no anti-obesity medication.   Review of Systems:  Pertinent positives were addressed with patient today.  Reviewed by clinician on day of visit: allergies, medications, problem list, medical history, surgical history, family history, social history, and previous encounter notes.  Weight Summary and Biometrics   Weight Lost Since Last Visit: 4 lb  Weight Gained Since Last Visit: 0 lb   Vitals Temp: 97.8 F (36.6 C) BP: 130/80 Pulse Rate: 67 SpO2: 96 %   Anthropometric Measurements Height: 5\' 6"  (1.676 m) Weight: 180 lb (81.6 kg) BMI (Calculated): 29.07 Weight at Last Visit: 184 lb Weight Lost Since Last Visit: 4 lb Weight Gained Since Last Visit: 0 lb Starting Weight: 189 lb Total Weight Loss (lbs): 9 lb (4.082 kg) Peak Weight: 250 lb   Body Composition  Body Fat %: 40.5 % Fat Mass (lbs): 73.2 lbs Muscle Mass (lbs): 102.2 lbs Total Body Water (lbs): 72.2 lbs Visceral Fat Rating : 10   Other Clinical Data RMR: [id number] Fasting: no Labs: no Today's Visit #: 3 Starting Date: 01/20/24    Objective:   PHYSICAL EXAM: Blood pressure 130/80, pulse 67, temperature 97.8 F (36.6 C), height 5\' 6"  (1.676 m), weight 180 lb (81.6 kg), last menstrual period 12/24/2006, SpO2 96%. Body mass index is 29.05 kg/m.  General: she is overweight, cooperative and in no acute distress. PSYCH: Has normal mood, affect and thought process.   HEENT: EOMI, sclerae are anicteric. Lungs: Normal breathing effort, no conversational dyspnea. Extremities: Moves  * 4 Neurologic: A and O * 3, good insight  DIAGNOSTIC DATA REVIEWED: BMET    Component Value Date/Time   NA 143 01/20/2024 0953   K 4.5 01/20/2024 0953   CL 105 01/20/2024 0953   CO2 24 01/20/2024 0953   GLUCOSE 97 01/20/2024 0953   GLUCOSE 95 09/04/2023 0837   BUN 13 01/20/2024 0953   CREATININE 0.72 01/20/2024 0953   CALCIUM 9.9 01/20/2024 0953   GFRNONAA >60 06/08/2022 2009   GFRAA >60 11/15/2017 2142   Lab Results  Component Value Date   HGBA1C 5.5 01/20/2024   HGBA1C 5.6 10/17/2015   Lab Results  Component Value Date   INSULIN 11.2 01/20/2024   Lab Results  Component Value Date   TSH 2.190 01/20/2024   CBC    Component Value Date/Time   WBC 4.7 01/20/2024 0953   WBC 4.9 09/04/2023 0837   RBC 4.87 01/20/2024 0953   RBC 4.63 09/04/2023 0837   HGB 14.3 01/20/2024 0953   HCT 42.3 01/20/2024 0953   PLT 253 01/20/2024 0953   MCV 87 01/20/2024 0953   MCH 29.4 01/20/2024 0953   MCH 28.4 06/08/2022 2009   MCHC 33.8 01/20/2024 0953   MCHC 32.6 09/04/2023 0837   RDW 12.8 01/20/2024 0953  Iron Studies    Component Value Date/Time   IRON 89 11/14/2022 1321   TIBC 326.2 11/14/2022 1321   FERRITIN 87.8 11/14/2022 1321   IRONPCTSAT 27.3 11/14/2022 1321   Lipid Panel     Component Value Date/Time   CHOL 213 (H) 01/20/2024 0953   TRIG 100 01/20/2024 0953   HDL 54 01/20/2024 0953   CHOLHDL 3 09/04/2023 0837   VLDL 17.0 09/04/2023 0837   LDLCALC 141 (H) 01/20/2024 0953   LDLDIRECT 118.0 12/28/2019 1323   Hepatic Function Panel     Component Value Date/Time   PROT 7.1 01/20/2024 0953   ALBUMIN 4.5 01/20/2024 0953   AST 15 01/20/2024 0953   ALT 13 01/20/2024 0953   ALKPHOS 101 01/20/2024 0953   BILITOT 0.3 01/20/2024 0953   BILIDIR 0.1 09/04/2023 0837      Component Value Date/Time   TSH 2.190 01/20/2024 0953   Nutritional Lab Results  Component Value Date   VD25OH 33.4 01/20/2024   VD25OH 37.16 04/04/2020   VD25OH 28.91 (L) 01/26/2020     Attestations:   I, Camryn Mix, acting as a Stage manager for [employer], DO., have compiled all relevant documentation for today's office visit on behalf of Thomasene Lot, DO, while in the presence of [employer], DO.  Reviewed by clinician on day of visit: allergies, medications, problem list, medical history, surgical history, family history, social history, and previous encounter notes pertinent to patient's obesity diagnosis. I have spent 40 *** minutes in the care of the patient today including: preparing to see patient (e.g. review and interpretation of tests, old notes ), obtaining and/or reviewing separately obtained history, performing a medically appropriate examination or evaluation, counseling and educating the patient, ordering medications, test or procedures, documenting clinical information in the electronic or other health care record, and independently interpreting results and communicating results to the patient, family, or caregiver   I have reviewed the above documentation for accuracy and completeness, and I agree with the above. Carlye Grippe, D.O.  The 21st Century Cures Act was signed into law in 2016 which includes the topic of electronic health records.  This provides immediate access to information in MyChart.  This includes consultation notes, operative notes, office notes, lab results and pathology reports.  If you have any questions about what you read please let us know at your next visit so we can discuss your concerns and take corrective action if need be.  We are right here with you.

## 2024-02-21 NOTE — Progress Notes (Deleted)
 [name], D.O.  ABFM, ABOM Specializing in Clinical Bariatric Medicine  Office located at: [address] [place], [place]  [address]   Assessment and Plan:   Medications Discontinued During This Encounter  Medication Reason   Vitamin D, Ergocalciferol, (DRISDOL) 1.25 MG (50000 UNIT) CAPS capsule Reorder     Meds ordered this encounter  Medications   Vitamin D, Ergocalciferol, (DRISDOL) 1.25 MG (50000 UNIT) CAPS capsule    Sig: Take 1 capsule (50,000 Units total) by mouth every 7 (seven) days.    Dispense:  4 capsule    Refill:  0     FOR THE DISEASE OF OBESITY: BMI 25.0-29.9, adult -- current bmi 29.07 Obesity (BMI 30-39.9) - Starting BMI 30.52 Assessment & Plan: Since last office visit on 02/03/2024 patient's  Muscle mass has decreased by 1.6 lbs. Fat mass has decreased by 1.4 lbs. Total body water has increased by 0.6 lbs.  Counseling done on how various foods will affect these numbers and how to maximize success  Total lbs lost to date: 9 lbs Total weight loss percentage to date: 4.76%    Recommended Dietary Goals [name] is currently in the action stage of change. As such, her goal is to continue weight management plan.  She has agreed to: continue current plan   Behavioral Intervention We discussed the following today: Protein options that aren't meat (e.g. egg whites, lentils, etc.)   Additional resources provided today: handout on CAT 1 meal plan , Handout on non-starchy vegetables, Handout on healthy eating principles- plate portions etc  Evidence-based interventions for health behavior change were utilized today including the discussion of self monitoring techniques, problem-solving barriers and SMART goal setting techniques.   Regarding patient's less desirable eating habits and patterns, we employed the technique of small changes.   Pt will specifically work on: n/a   Recommended Physical Activity Goals [name] has been advised to work up to  150 minutes of moderate intensity aerobic activity a week and strengthening exercises 2-3 times per week for cardiovascular health, weight loss maintenance and preservation of muscle mass.   She has agreed to :  Continue current level of physical activity    Pharmacotherapy We both agreed to : continue with nutritional and behavioral strategies   FOR ASSOCIATED CONDITIONS ADDRESSED TODAY:   Prediabetes Assessment & Plan: Not currently on any medications. Diet/exercise approach. Handouts provided at pt's request after education provided.  All concerns/questions addressed. [name] will continue to work on weight loss, exercise, via her meal plan we devised to help decrease the risk of progressing to diabetes. We will recheck A1c and fasting insulin level 3 months from last obtained results.    Family History of Early CAD Encounter for screening for coronary artery disease  Assessment & Plan: [name] was recently started on Crestor by PCP--> started on 02/14/2024 after Coronary CT scan results.      Coronary CT on 02/10/2024 --  1. Coronary calcium score of 843. This was 99th percentile for age and sex matched control. 2. CAC >300 in LAD, RCA. CAC-DRS A3/N2. 3. Recommend aspirin and statin if no contraindication. 4. Recommend cardiology consultation. 5. Continue heart healthy lifestyle and risk factor modification.   Pt received a Coronary CT scan on 02/10/2024 which showed a coronary calcium score of 843, this was 99th percentile for her age.  Her maternal grandmother passed away from a MI at 47 years old. Pt reports her PCP put in a consult on 02/20 to cardiology  and pt never heard from them.   Encouraged her to call [facility] and ask for the status of the consult.    -> Extensive education provided to pt on the meaning of this imaging study. All questions/ concerns addressed.  I recommended pt focus on intensive risk factor modification to decrease risk of future ASCVD  events as well as seek consultation of Cardiology as planned in near future.    Vitamin D deficiency  Assessment & Plan: Pt is treating with ERGO 50K weekly. Pt is tolerating well. No concerns in this regard today. Will refill today.   Relevant Orders: -     Vitamin D (Ergocalciferol); Take 1 capsule (50,000 Units total) by mouth every 7 (seven) days.  Dispense: 4 capsule; Refill: 0    Vitamin B12 insufficency Assessment & Plan: [name] is treating Vit B12 deficiency with Cyanocobalamin 300-500 mcg daily. She is compliant with this medication. Continue current medication regimen.   Follow up:   Return in about 22 days (around 03/12/2024). She was informed of the importance of frequent follow up visits to maximize her success with intensive lifestyle modifications for her multiple health conditions.    Subjective:   Chief complaint: Obesity [name] is here to discuss her progress with her obesity treatment plan. She is on the the Category 1 Plan with B/L options and states she is following her eating plan approximately 90% of the time. She states she is biking 15 minutes 3 days per week.  Interval History:  [name] is here for a follow up office visit. Since last OV on 02/03/2024, [name] is down 4 lbs. She has been weighing her meals and reports that the meal plan is going well, however it is a lot of protein sometimes. [name] has been more intentional with her calories intake (e.g. adding an alternative sweetener to her coffee).  Pharmacotherapy for weight loss: She is currently taking none  Review of Systems:  Pertinent positives were addressed with patient today.  Reviewed by clinician on day of visit: allergies, medications, problem list, medical history, surgical history, family history, social history, and previous encounter notes.  Weight Summary and Biometrics   Weight Lost Since Last Visit: 4 lb   Weight Gained Since Last Visit: 0 lb     Vitals Temp:  97.8 F (36.6 C) BP: 130/80 Pulse Rate: 67 SpO2: 96 %     Anthropometric Measurements Height: 5\' 6"  (1.676 m) Weight: 180 lb (81.6 kg) BMI (Calculated): 29.07 Weight at Last Visit: 184 lb Weight Lost Since Last Visit: 4 lb Weight Gained Since Last Visit: 0 lb Starting Weight: 189 lb Total Weight Loss (lbs): 9 lb (4.082 kg) Peak Weight: 250 lb     Body Composition Body Fat %: 40.5 % Fat Mass (lbs): 73.2 lbs Muscle Mass (lbs): 102.2 lbs Total Body Water (lbs): 72.2 lbs Visceral Fat Rating : 10     Other Clinical Data RMR: [id number] Fasting: no Labs: no Today's Visit #: 3 Starting Date: 01/20/24   Objective:   PHYSICAL EXAM: Blood pressure 130/80, pulse 67, temperature 97.8 F (36.6 C), height 5\' 6"  (1.676 m), weight 180 lb (81.6 kg), last menstrual period 12/24/2006, SpO2 96%. Body mass index is 29.05 kg/m.  General: she is overweight, cooperative and in no acute distress. PSYCH: Has normal mood, affect and thought process.   HEENT: EOMI, sclerae are anicteric. Lungs: Normal breathing effort, no conversational dyspnea. Extremities: Moves * 4 Neurologic: A and O * 3, good insight  DIAGNOSTIC DATA REVIEWED: BMET    Component Value Date/Time   NA 143 01/20/2024 0953   K 4.5 01/20/2024 0953   CL 105 01/20/2024 0953   CO2 24 01/20/2024 0953   GLUCOSE 97 01/20/2024 0953   GLUCOSE 95 09/04/2023 0837   BUN 13 01/20/2024 0953   CREATININE 0.72 01/20/2024 0953   CALCIUM 9.9 01/20/2024 0953   GFRNONAA >60 06/08/2022 2009   GFRAA >60 11/15/2017 2142   Lab Results  Component Value Date   HGBA1C 5.5 01/20/2024   HGBA1C 5.6 10/17/2015   Lab Results  Component Value Date   INSULIN 11.2 01/20/2024   Lab Results  Component Value Date   TSH 2.190 01/20/2024   CBC    Component Value Date/Time   WBC 4.7 01/20/2024 0953   WBC 4.9 09/04/2023 0837   RBC 4.87 01/20/2024 0953   RBC 4.63 09/04/2023 0837   HGB 14.3 01/20/2024 0953   HCT 42.3 01/20/2024 0953   PLT  253 01/20/2024 0953   MCV 87 01/20/2024 0953   MCH 29.4 01/20/2024 0953   MCH 28.4 06/08/2022 2009   MCHC 33.8 01/20/2024 0953   MCHC 32.6 09/04/2023 0837   RDW 12.8 01/20/2024 0953   Iron Studies    Component Value Date/Time   IRON 89 11/14/2022 1321   TIBC 326.2 11/14/2022 1321   FERRITIN 87.8 11/14/2022 1321   IRONPCTSAT 27.3 11/14/2022 1321   Lipid Panel     Component Value Date/Time   CHOL 213 (H) 01/20/2024 0953   TRIG 100 01/20/2024 0953   HDL 54 01/20/2024 0953   CHOLHDL 3 09/04/2023 0837   VLDL 17.0 09/04/2023 0837   LDLCALC 141 (H) 01/20/2024 0953   LDLDIRECT 118.0 12/28/2019 1323   Hepatic Function Panel     Component Value Date/Time   PROT 7.1 01/20/2024 0953   ALBUMIN 4.5 01/20/2024 0953   AST 15 01/20/2024 0953   ALT 13 01/20/2024 0953   ALKPHOS 101 01/20/2024 0953   BILITOT 0.3 01/20/2024 0953   BILIDIR 0.1 09/04/2023 0837      Component Value Date/Time   TSH 2.190 01/20/2024 0953   Nutritional Lab Results  Component Value Date   VD25OH 33.4 01/20/2024   VD25OH 37.16 04/04/2020   VD25OH 28.91 (L) 01/26/2020    Attestations:   I, Camryn Mix, acting as a Stage manager for [employer], DO., have compiled all relevant documentation for today's office visit on behalf of Thomasene Lot, DO, while in the presence of [employer], DO. I have spent 40 minutes in the care of the patient today including: preparing to see patient (e.g. review and interpretation of tests, old notes ), obtaining and/or reviewing separately obtained history, performing a medically appropriate examination or evaluation, counseling and educating the patient, ordering medications, test or procedures, documenting clinical information in the electronic or other health care record, and independently interpreting results and communicating results to the patient, family, or caregiver  I have reviewed the above documentation for accuracy and completeness, and I agree with the  above. [name], D.O.  The 21st Century Cures Act was signed into law in 2016 which includes the topic of electronic health records.  This provides immediate access to information in MyChart.  This includes consultation notes, operative notes, office notes, lab results and pathology reports.  If you have any questions about what you read please let us know at your next visit so we can discuss your concerns and take corrective action if need be.  We are right here with you.

## 2024-02-24 ENCOUNTER — Telehealth (INDEPENDENT_AMBULATORY_CARE_PROVIDER_SITE_OTHER): Payer: Commercial Managed Care - PPO | Admitting: Psychology

## 2024-03-03 ENCOUNTER — Ambulatory Visit: Admitting: Podiatry

## 2024-03-03 ENCOUNTER — Ambulatory Visit (INDEPENDENT_AMBULATORY_CARE_PROVIDER_SITE_OTHER)

## 2024-03-03 ENCOUNTER — Encounter: Payer: Self-pay | Admitting: Podiatry

## 2024-03-03 VITALS — Ht 66.0 in | Wt 180.0 lb

## 2024-03-03 DIAGNOSIS — M79671 Pain in right foot: Secondary | ICD-10-CM

## 2024-03-03 DIAGNOSIS — G5781 Other specified mononeuropathies of right lower limb: Secondary | ICD-10-CM | POA: Diagnosis not present

## 2024-03-03 NOTE — Progress Notes (Unsigned)
 Chief Complaint  Patient presents with   Foot Pain   HPI: 58 y.o. female presenting today with c/o pain, burning/tingling, noted near the right 3rd interspace.  Patient describes pain on the ball of the foot when standing.  Feels like "something" is on the bottom of the foot when walking, but nothing is actually there.  Notes stabbing pain sometimes on ball of foot near 3rd and 4th toes.  Symptoms have been present about 6 months.  Denies injury.   Past Medical History:  Diagnosis Date   ADHD    Allergy    Anxiety    Back pain    Constipated    Depression    Fatty liver    GERD (gastroesophageal reflux disease)    History of abnormal cervical Pap smear    1991 -- 1995--hx cryotherapy to cervix   History of colon polyps    2008- BENIGN   Hydronephrosis, left    Kidney stones 2016   PONV (postoperative nausea and vomiting)    RLS (restless legs syndrome)    Rosacea    Vitamin D deficiency     Past Surgical History:  Procedure Laterality Date   ABDOMINAL HYSTERECTOMY     ANTERIOR AND POSTERIOR REPAIR WITH SACROSPINOUS FIXATION N/A 08/16/2015   Procedure: ANTERIOR COLPORRHAPHY WITH XENOFORM GRAFT AND SACROSPINOUS FIXATION;  Surgeon: Patton Salles, MD;  Location: [facility];  Service: Gynecology;  Laterality: N/A;  2.5 hours OR time   BLADDER SUSPENSION N/A 08/16/2015   Procedure: TRANSVAGINAL TAPE (TVT) PROCEDURE exact midurethral sling;  Surgeon: Patton Salles, MD;  Location: [facility];  Service: Gynecology;  Laterality: N/A;   CHOLECYSTECTOMY  1998   COLONOSCOPY  08/21/2017   per Dr. Christella Hartigan, clear, repeat in 10 yrs   CYSTOCELE REPAIR N/A 12/12/2022   Procedure: ANTERIOR REPAIR (CYSTOCELE);  Surgeon: Marguerita Beards, MD;  Location: [facility];  Service: Gynecology;  Laterality: N/A;   CYSTOSCOPY N/A 08/16/2015   Procedure: CYSTOSCOPY;  Surgeon: Patton Salles, MD;  Location: [facility];  Service: Gynecology;  Laterality: N/A;    CYSTOSCOPY N/A 12/12/2022   Procedure: CYSTOSCOPY;  Surgeon: Marguerita Beards, MD;  Location: [facility];  Service: Gynecology;  Laterality: N/A;   CYSTOSCOPY W/ RETROGRADES Bilateral 06/22/2015   Procedure: CYSTOSCOPY WITH RETROGRADE PYELOGRAM;  Surgeon: Malen Gauze, MD;  Location: [facility];  Service: Urology;  Laterality: Bilateral;   CYSTOSCOPY W/ URETERAL STENT PLACEMENT  01/1999   CYSTOSCOPY WITH HOLMIUM LASER LITHOTRIPSY Left 05/18/2015   Procedure: CYSTOSCOPY WITH HOLMIUM LASER LITHOTRIPSY;  Surgeon: Sebastian Ache, MD;  Location: [facility];  Service: Urology;  Laterality: Left;   CYSTOSCOPY WITH RETROGRADE PYELOGRAM, URETEROSCOPY AND STENT PLACEMENT Left 06/22/2015   Procedure: CYSTOSCOPY,  LEFT URETEROSCOPY;  Surgeon: Malen Gauze, MD;  Location: [facility];  Service: Urology;  Laterality: Left;   CYSTOSCOPY WITH URETEROSCOPY AND STENT PLACEMENT Left 05/18/2015   Procedure: CYSTOSCOPY WITH URETEROSCOPY, STONE MANIPULATION AND STENT PLACEMENT;  Surgeon: Sebastian Ache, MD;  Location: [facility];  Service: Urology;  Laterality: Left;   DIAGNOSTIC LAPAROSCOPY     DX LAPAROSCOPY  X2   ESOPHAGOGASTRODUODENOSCOPY  05/09/2007   EXPLORATORY LAPAROTOMY W/ BILATERAL SALPINGECTOMY AND REMOVAL RIGHT ECTOPIC PREG.  02/23/2004   EXTRACORPOREAL SHOCK WAVE LITHOTRIPSY  2001  &  2002   LAPAROSCOPIC CHOLECYSTECTOMY  1999   POLYPECTOMY  ROBOTIC ASSISTED LAPAROSCOPIC SACROCOLPOPEXY N/A 12/12/2022   Procedure: XI ROBOTIC ASSISTED LAPAROSCOPIC SACROCOLPOPEXY;  Surgeon: Marguerita Beards, MD;  Location: [facility];  Service: Gynecology;  Laterality: N/A;  Total time requested 3 hours -Assist requested   SHOULDER ARTHROSCOPY WITH OPEN ROTATOR CUFF REPAIR Right 2012   TOTAL ABDOMINAL HYSTERECTOMY W/ BILATERAL OOPHORECTOMY AND LYSIS ADHESIONS  09/02/2007   TUBAL LIGATION Bilateral 1995    UMBILICAL HERNIA REPAIR  2003   unsure if she has mesh   VAGINAL HYSTERECTOMY N/A 08/18/2015   Procedure: Exam under Anesthesia, Excision Xenform Graft, Removal Bilateral Sacrospinous Ligament sutures;  Surgeon: Patton Salles, MD;  Location: [facility];  Service: Gynecology;  Laterality: N/A;    Allergies  Allergen Reactions   Desvenlafaxine Other (See Comments)    Reaction:  Withdrawal    Prochlorperazine Other (See Comments)    Altered mental status   Iodinated Contrast Media Rash and Other (See Comments)    Previously mis-entered as Iohexol allergy. Patient is not allergic to Non-Ionic contrast currently.    PHYSICAL EXAM: General: The patient is alert and oriented x3 in no acute distress.  Dermatology: Skin is warm, dry and supple bilateral lower extremities. Interspaces are clear of maceration and debris.    Vascular: Palpable pedal pulses bilaterally. Capillary refill within normal limits.  No appreciable edema.  No erythema or calor.  Neurological: Light touch sensation grossly intact bilateral feet.   Musculoskeletal Exam: No pedal deformities noted.  No pain on palpation of the metatarsals.  No pain with ROM of the MPJ's.  There is a + Muldor's sign with compression of the right 3rd interspace.  There is pain with compression of the right 3rd interspace.   RADIOGRAPHIC EXAM (right foot, 3 WB views, 03/03/24):  Normal osseous mineralization. Joint spaces preserved.  No fractures or osseous irregularities noted.  ASSESSMENT / PLAN OF CARE: 1. 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 Foot pain, right     Discussed patient's condition today and possible etiologies.  Discussed conservative treatment options, including off-loading, shoe modifications, cortisone injection, NSAID use, and possible surgery if conservative options fail.  Recommended shoes such as [employer] (thicker sole version), [employer], [employer], [employer].  Powerstep inserts fitted/dispensed  today.  With the patient's verbal consent, a corticosteroid injection was adminstered to the right 3rd interspace.  This consisted of a mixture of 1% lidocaine plain, 0.5% sensorcaine plain, and Kenalog-10 for a total of 1.25cc's administered.  Bandaid applied. Patient tolerated this well.     Return in about 4 weeks (around 03/31/2024) for f/u neuroma R 3rd interspace.   Clerance Lav, DPM, FACFAS [facility]     [address] [place], [place] [address]                Office [phone]  Fax [phone]

## 2024-03-04 ENCOUNTER — Other Ambulatory Visit (INDEPENDENT_AMBULATORY_CARE_PROVIDER_SITE_OTHER): Payer: Self-pay | Admitting: Family Medicine

## 2024-03-04 DIAGNOSIS — E559 Vitamin D deficiency, unspecified: Secondary | ICD-10-CM

## 2024-03-05 ENCOUNTER — Encounter

## 2024-03-05 DIAGNOSIS — G4733 Obstructive sleep apnea (adult) (pediatric): Secondary | ICD-10-CM | POA: Diagnosis not present

## 2024-03-06 ENCOUNTER — Encounter (INDEPENDENT_AMBULATORY_CARE_PROVIDER_SITE_OTHER): Payer: Self-pay | Admitting: Family Medicine

## 2024-03-09 NOTE — Progress Notes (Signed)
 [name], D.O.  ABFM, ABOM Specializing in Clinical Bariatric Medicine  Office located at: [address] [place], [place]  [address]   Assessment and Plan:   Medications Discontinued During This Encounter  Medication Reason   Vitamin D, Ergocalciferol, (DRISDOL) 1.25 MG (50000 UNIT) CAPS capsule Reorder     Meds ordered this encounter  Medications   Vitamin D, Ergocalciferol, (DRISDOL) 1.25 MG (50000 UNIT) CAPS capsule    Sig: Take 1 capsule (50,000 Units total) by mouth every 7 (seven) days.    Dispense:  4 capsule    Refill:  0     FOR THE DISEASE OF OBESITY: BMI 25.0-29.9, adult -- current bmi 29.07 Obesity (BMI 30-39.9) - Starting BMI 30.52 Assessment & Plan: Since last office visit on 02/03/2024 patient's  Muscle mass has decreased by 1.6 lbs. Fat mass has decreased by 1.4 lbs. Total body water has increased by 0.6 lbs.  Counseling done on how various foods will affect these numbers and how to maximize success  Total lbs lost to date: 9 lbs Total weight loss percentage to date: 4.76%    Recommended Dietary Goals [name] is currently in the action stage of change. As such, her goal is to continue weight management plan.  She has agreed to: continue current plan   Behavioral Intervention We discussed the following today: Protein options that aren't meat (e.g. egg whites, lentils, etc.)   Additional resources provided today: handout on CAT 1 meal plan , Handout on non-starchy vegetables, Handout on healthy eating principles- plate portions etc  Evidence-based interventions for health behavior change were utilized today including the discussion of self monitoring techniques, problem-solving barriers and SMART goal setting techniques.   Regarding patient's less desirable eating habits and patterns, we employed the technique of small changes.   Pt will specifically work on: n/a   Recommended Physical Activity Goals [name] has been advised to work up to  150 minutes of moderate intensity aerobic activity a week and strengthening exercises 2-3 times per week for cardiovascular health, weight loss maintenance and preservation of muscle mass.   She has agreed to :  Continue current level of physical activity    Pharmacotherapy We both agreed to : continue with nutritional and behavioral strategies   FOR ASSOCIATED CONDITIONS ADDRESSED TODAY:   Prediabetes Assessment & Plan: Not currently on any medications. Diet/exercise approach. Handouts provided at pt's request after education provided.  All concerns/questions addressed. [name] will continue to work on weight loss, exercise, via her meal plan we devised to help decrease the risk of progressing to diabetes. We will recheck A1c and fasting insulin level 3 months from last obtained results.    Family History of Early CAD Encounter for screening for coronary artery disease  Assessment & Plan: [name] was recently started on Crestor by PCP--> started on 02/14/2024 after Coronary CT scan results.      Coronary CT on 02/10/2024 --  1. Coronary calcium score of 843. This was 99th percentile for age and sex matched control. 2. CAC >300 in LAD, RCA. CAC-DRS A3/N2. 3. Recommend aspirin and statin if no contraindication. 4. Recommend cardiology consultation. 5. Continue heart healthy lifestyle and risk factor modification.   Pt received a Coronary CT scan on 02/10/2024 which showed a coronary calcium score of 843, this was 99th percentile for her age.  Her maternal grandmother passed away from a MI at 47 years old. Pt reports her PCP put in a consult on 02/20 to cardiology  and pt never heard from them.   Encouraged her to call [facility] and ask for the status of the consult.    -> Extensive education provided to pt on the meaning of this imaging study. All questions/ concerns addressed.  I recommended pt focus on intensive risk factor modification to decrease risk of future ASCVD  events as well as seek consultation of Cardiology as planned in near future.    Vitamin D deficiency  Assessment & Plan: Pt is treating with ERGO 50K weekly. Pt is tolerating well. No concerns in this regard today. Will refill today.   Relevant Orders: -     Vitamin D (Ergocalciferol); Take 1 capsule (50,000 Units total) by mouth every 7 (seven) days.  Dispense: 4 capsule; Refill: 0    Vitamin B12 insufficency Assessment & Plan: [name] is treating Vit B12 deficiency with Cyanocobalamin 300-500 mcg daily. She is compliant with this medication. Continue current medication regimen.   Follow up:   Return in about 22 days (around 03/12/2024). She was informed of the importance of frequent follow up visits to maximize her success with intensive lifestyle modifications for her multiple health conditions.    Subjective:   Chief complaint: Obesity [name] is here to discuss her progress with her obesity treatment plan. She is on the the Category 1 Plan with B/L options and states she is following her eating plan approximately 90% of the time. She states she is biking 15 minutes 3 days per week.  Interval History:  [name] is here for a follow up office visit. Since last OV on 02/03/2024, [name] is down 4 lbs. She has been weighing her meals and reports that the meal plan is going well, however it is a lot of protein sometimes. [name] has been more intentional with her calories intake (e.g. adding an alternative sweetener to her coffee).  Pharmacotherapy for weight loss: She is currently taking none  Review of Systems:  Pertinent positives were addressed with patient today.  Reviewed by clinician on day of visit: allergies, medications, problem list, medical history, surgical history, family history, social history, and previous encounter notes.  Weight Summary and Biometrics   Weight Lost Since Last Visit: 4 lb   Weight Gained Since Last Visit: 0 lb     Vitals Temp:  97.8 F (36.6 C) BP: 130/80 Pulse Rate: 67 SpO2: 96 %     Anthropometric Measurements Height: 5\' 6"  (1.676 m) Weight: 180 lb (81.6 kg) BMI (Calculated): 29.07 Weight at Last Visit: 184 lb Weight Lost Since Last Visit: 4 lb Weight Gained Since Last Visit: 0 lb Starting Weight: 189 lb Total Weight Loss (lbs): 9 lb (4.082 kg) Peak Weight: 250 lb     Body Composition Body Fat %: 40.5 % Fat Mass (lbs): 73.2 lbs Muscle Mass (lbs): 102.2 lbs Total Body Water (lbs): 72.2 lbs Visceral Fat Rating : 10     Other Clinical Data RMR: [id number] Fasting: no Labs: no Today's Visit #: 3 Starting Date: 01/20/24   Objective:   PHYSICAL EXAM: Blood pressure 130/80, pulse 67, temperature 97.8 F (36.6 C), height 5\' 6"  (1.676 m), weight 180 lb (81.6 kg), last menstrual period 12/24/2006, SpO2 96%. Body mass index is 29.05 kg/m.  General: she is overweight, cooperative and in no acute distress. PSYCH: Has normal mood, affect and thought process.   HEENT: EOMI, sclerae are anicteric. Lungs: Normal breathing effort, no conversational dyspnea. Extremities: Moves * 4 Neurologic: A and O * 3, good insight  DIAGNOSTIC DATA REVIEWED: BMET    Component Value Date/Time   NA 143 01/20/2024 0953   K 4.5 01/20/2024 0953   CL 105 01/20/2024 0953   CO2 24 01/20/2024 0953   GLUCOSE 97 01/20/2024 0953   GLUCOSE 95 09/04/2023 0837   BUN 13 01/20/2024 0953   CREATININE 0.72 01/20/2024 0953   CALCIUM 9.9 01/20/2024 0953   GFRNONAA >60 06/08/2022 2009   GFRAA >60 11/15/2017 2142   Lab Results  Component Value Date   HGBA1C 5.5 01/20/2024   HGBA1C 5.6 10/17/2015   Lab Results  Component Value Date   INSULIN 11.2 01/20/2024   Lab Results  Component Value Date   TSH 2.190 01/20/2024   CBC    Component Value Date/Time   WBC 4.7 01/20/2024 0953   WBC 4.9 09/04/2023 0837   RBC 4.87 01/20/2024 0953   RBC 4.63 09/04/2023 0837   HGB 14.3 01/20/2024 0953   HCT 42.3 01/20/2024 0953   PLT  253 01/20/2024 0953   MCV 87 01/20/2024 0953   MCH 29.4 01/20/2024 0953   MCH 28.4 06/08/2022 2009   MCHC 33.8 01/20/2024 0953   MCHC 32.6 09/04/2023 0837   RDW 12.8 01/20/2024 0953   Iron Studies    Component Value Date/Time   IRON 89 11/14/2022 1321   TIBC 326.2 11/14/2022 1321   FERRITIN 87.8 11/14/2022 1321   IRONPCTSAT 27.3 11/14/2022 1321   Lipid Panel     Component Value Date/Time   CHOL 213 (H) 01/20/2024 0953   TRIG 100 01/20/2024 0953   HDL 54 01/20/2024 0953   CHOLHDL 3 09/04/2023 0837   VLDL 17.0 09/04/2023 0837   LDLCALC 141 (H) 01/20/2024 0953   LDLDIRECT 118.0 12/28/2019 1323   Hepatic Function Panel     Component Value Date/Time   PROT 7.1 01/20/2024 0953   ALBUMIN 4.5 01/20/2024 0953   AST 15 01/20/2024 0953   ALT 13 01/20/2024 0953   ALKPHOS 101 01/20/2024 0953   BILITOT 0.3 01/20/2024 0953   BILIDIR 0.1 09/04/2023 0837      Component Value Date/Time   TSH 2.190 01/20/2024 0953   Nutritional Lab Results  Component Value Date   VD25OH 33.4 01/20/2024   VD25OH 37.16 04/04/2020   VD25OH 28.91 (L) 01/26/2020    Attestations:   I, Camryn Mix, acting as a Stage manager for [employer], DO., have compiled all relevant documentation for today's office visit on behalf of Thomasene Lot, DO, while in the presence of [employer], DO. I have spent 40 minutes in the care of the patient today including: preparing to see patient (e.g. review and interpretation of tests, old notes ), obtaining and/or reviewing separately obtained history, performing a medically appropriate examination or evaluation, counseling and educating the patient, ordering medications, test or procedures, documenting clinical information in the electronic or other health care record, and independently interpreting results and communicating results to the patient, family, or caregiver  I have reviewed the above documentation for accuracy and completeness, and I agree with the  above. [name], D.O.  The 21st Century Cures Act was signed into law in 2016 which includes the topic of electronic health records.  This provides immediate access to information in MyChart.  This includes consultation notes, operative notes, office notes, lab results and pathology reports.  If you have any questions about what you read please let us know at your next visit so we can discuss your concerns and take corrective action if need be.  We are right here with you.

## 2024-03-10 ENCOUNTER — Encounter: Payer: Self-pay | Admitting: Psychology

## 2024-03-10 ENCOUNTER — Ambulatory Visit: Payer: Commercial Managed Care - PPO | Admitting: Psychology

## 2024-03-10 DIAGNOSIS — F411 Generalized anxiety disorder: Secondary | ICD-10-CM | POA: Diagnosis not present

## 2024-03-10 DIAGNOSIS — F4321 Adjustment disorder with depressed mood: Secondary | ICD-10-CM | POA: Diagnosis not present

## 2024-03-10 DIAGNOSIS — F330 Major depressive disorder, recurrent, mild: Secondary | ICD-10-CM | POA: Diagnosis not present

## 2024-03-10 NOTE — Progress Notes (Signed)
 [facility] Counselor Annual Adult Exam  Name: [name] Date: 03/10/2024 MRN: [id number] DOB: [birth year] PCP: Nelwyn Salisbury, MD  Time spent: 12:01pm-12:55pm  Pt presents for a virtual video visit via caregility.  Pt joins from her home, reporting privacy and counselor from her home office.  Pt consents to virtual visit and is aware of limitations of such visits.    Guardian/Payee:  self    Paperwork requested:  n/a  Reason for Visit /Presenting Problem:  Pt presents for annual evaluation of continued counseling for GAD and MDD.  Pt was referred by Dr. Clent Ridges for counseling due to stress and started w/ counselor Feb 2023.  Pt had struggled w/ anxiety for years previous.  Pt anxiety has impacted her relationship w/ emotional escalations during conflicts.  Pt stopped following up for a couple of months in 2024 and then returned in July 2024 and has been consistent w/ counseling about biweekly.  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  Pt father died Oct 14, 2024after quick health decline over summer months. Pt has struggled w/ grief and further worry w/ mother's health challenges this past year as well.  Pt has using coping skills to manage grief, working on expressing her feelings effectively and increasing deescalations skills for marital conflicts.    Mental Status Exam: Appearance:   Well Groomed     Behavior:  Appropriate  Motor:  Normal  Speech/Language:   Normal Rate  Affect:  Appropriate  Mood:  anxious and sad  Thought process:  normal  Thought content:    WNL  Sensory/Perceptual disturbances:    WNL  Orientation:  oriented to person, place, time/date, and situation  Attention:  Good  Concentration:  Good  Memory:  WNL  Fund of knowledge:   Good  Insight:    Good  Judgment:   Good  Impulse Control:   Good    Reported Symptoms:  Pt reports anxiety and feeling overwhelmed w/ balance of full time job, husband's business and self care.  Pt reports improving w/  deescalate when upset/anxious/angry.  Pt reports  grief w/ death of dad.  Pt less tearful than in first several months.  Pt reports worry and anxiety w/ mom's health declining and worry that she will be able to cope when she dies.  Pt also has increased worry w/ recently dx health issues.     Risk Assessment: Danger to Self:  No Self-injurious Behavior: No Danger to Others: No Duty to Warn:no Physical Aggression / Violence:No  Access to Firearms a concern: No  Gang Involvement:No  Patient / guardian was educated about steps to take if suicide or homicide risk level increases between visits: n/a While future psychiatric events cannot be accurately predicted, the patient does not currently require acute inpatient psychiatric care and does not currently meet [facility] involuntary commitment criteria.  Substance Abuse History: Current substance abuse: No     Past Psychiatric History:   Previous psychological history is significant for anxiety Outpatient Providers:no hx of outpatient counseling.  History of Psych Hospitalization: No  Psychological Testing:  none    Abuse History:  Victim of: No.,  none    Report needed: No. Victim of Neglect:No. Perpetrator of  none   Witness / Exposure to Domestic Violence: No   Protective Services Involvement: No  Witness to [employer] Violence:  No  Family History:  Family History  Problem Relation Age of Onset   Kidney disease Mother    Hypertension Mother    Colon polyps Mother    Heart disease Mother    Depression Mother    Obesity Mother    Diabetes Father    Colon polyps Father    Drug abuse Sister    Heart attack Maternal Grandmother    Breast cancer Paternal Grandmother    Cancer Paternal Uncle        Pancreas   Pancreatic cancer Paternal Uncle    Esophageal cancer  Neg Hx    Stomach cancer Neg Hx    Rectal cancer Neg Hx    Colon cancer Neg Hx   Grew up in [place] w/ younger brother and sister that died in 2003/03/18 from drug overdose.  During high school years her parents divorced.  Senior year mom moved to [place] for  relationship at which time she moved in w/ dad and stepmom to finish high school.  Pt moved to [place], got married and began her family.    Mom lives in  [place] w/ her husband.   Mom is only one on that side she has left.  Brother has anxiety.  Niece has mental health problems.  SA for sister.  Father died 2023/09/18.   Living situation: the patient lives with her spouse.  They currently have 2 dogs that have breed in past.      In 2019/03/18 her 2 son's and their families moved in with them to save money and stressful/busy household for several months they lived together.    Sexual Orientation: Straight  Relationship Status: married since 39 Name of spouse / other:Adrian.  Husband on disability.   If a parent, number of children / ages: 2 boys age 29y/o and 33y/o and 5 grandchildren ages infant to 10y/o.    Support Systems: spouse, daughter in law and mom  Financial Stress:  No   Income/Employment/Disability: Employment.  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  Works from [employer] work hours- but flexible. At this company since 58 y/o.   Louann Sjogren is awesome.  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  Military Service: No   Educational History: Education: some college 1 year business school  degree  Religion/Sprituality/World View: Not reported  Any cultural differences that may affect / interfere with treatment:  not applicable   Recreation/Hobbies: getting nails done. Relaxing in bath.  Walks.  Going to beach.  Try to get together for 03/17/2024 family dinners.  Stressors: Other: overwhelmed w/ multiple commitments, death of father and mom's health, and her recent health dx.     Strengths: Supportive  Relationships, seeking tx  Barriers:  none reported   Legal History: Pending legal issue / charges: The patient has no significant history of legal issues. History of legal issue / charges:  none reported  Medical History/Surgical History: reviewed Past Medical History:  Diagnosis Date   ADHD    Allergy    Anxiety    Back pain    CAD (coronary artery disease)    Constipated    Depression    Fatty liver    GERD (gastroesophageal reflux disease)    History of abnormal cervical Pap smear    1991 -- 1995--hx cryotherapy to cervix   History of colon polyps    03/18/2007- BENIGN   Hydronephrosis, left    Kidney stones 03/18/2015   OSA (obstructive sleep apnea)  PONV (postoperative nausea and vomiting)    RLS (restless legs syndrome)    Rosacea    Vitamin D deficiency     Past Surgical History:  Procedure Laterality Date   ABDOMINAL HYSTERECTOMY     ANTERIOR AND POSTERIOR REPAIR WITH SACROSPINOUS FIXATION N/A 08/16/2015   Procedure: ANTERIOR COLPORRHAPHY WITH XENOFORM GRAFT AND SACROSPINOUS FIXATION;  Surgeon: Patton Salles, MD;  Location: [facility];  Service: Gynecology;  Laterality: N/A;  2.5 hours OR time   BLADDER SUSPENSION N/A 08/16/2015   Procedure: TRANSVAGINAL TAPE (TVT) PROCEDURE exact midurethral sling;  Surgeon: Patton Salles, MD;  Location: [facility];  Service: Gynecology;  Laterality: N/A;   CHOLECYSTECTOMY  1998   COLONOSCOPY  08/21/2017   per Dr. Christella Hartigan, clear, repeat in 10 yrs   CYSTOCELE REPAIR N/A 12/12/2022   Procedure: ANTERIOR REPAIR (CYSTOCELE);  Surgeon: Marguerita Beards, MD;  [facility];  Service: Gynecology;  Laterality: N/A;   CYSTOSCOPY N/A 08/16/2015   Procedure: CYSTOSCOPY;  Surgeon: Patton Salles, MD;  Location: [facility];  Service: Gynecology;  Laterality: N/A;   CYSTOSCOPY N/A 12/12/2022   Procedure: CYSTOSCOPY;  Surgeon: Marguerita Beards, MD;  Location: [facility];  Service:  Gynecology;  Laterality: N/A;   CYSTOSCOPY W/ RETROGRADES Bilateral 06/22/2015   Procedure: CYSTOSCOPY WITH RETROGRADE PYELOGRAM;  Surgeon: Malen Gauze, MD;  [facility];  Service: Urology;  Laterality: Bilateral;   CYSTOSCOPY W/ URETERAL STENT PLACEMENT  01/1999   CYSTOSCOPY WITH HOLMIUM LASER LITHOTRIPSY Left 05/18/2015   Procedure: CYSTOSCOPY WITH HOLMIUM LASER LITHOTRIPSY;  Surgeon: Sebastian Ache, MD;  Location: [facility];  Service: Urology;  Laterality: Left;   CYSTOSCOPY WITH RETROGRADE PYELOGRAM, URETEROSCOPY AND STENT PLACEMENT Left 06/22/2015   Procedure: CYSTOSCOPY,  LEFT URETEROSCOPY;  Surgeon: Malen Gauze, MD;  Location: [facility];  Service: Urology;  Laterality: Left;   CYSTOSCOPY WITH URETEROSCOPY AND STENT PLACEMENT Left 05/18/2015   Procedure: CYSTOSCOPY WITH URETEROSCOPY, STONE MANIPULATION AND STENT PLACEMENT;  Surgeon: Sebastian Ache, MD;  Location[facility];  Service: Urology;  Laterality: Left;   DIAGNOSTIC LAPAROSCOPY     DX LAPAROSCOPY  X2   ESOPHAGOGASTRODUODENOSCOPY  05/09/2007   EXPLORATORY LAPAROTOMY W/ BILATERAL SALPINGECTOMY AND REMOVAL RIGHT ECTOPIC PREG.  02/23/2004   EXTRACORPOREAL SHOCK WAVE LITHOTRIPSY  2001  &  2002   LAPAROSCOPIC CHOLECYSTECTOMY  1999   POLYPECTOMY     ROBOTIC ASSISTED LAPAROSCOPIC SACROCOLPOPEXY N/A 12/12/2022   Procedure: XI ROBOTIC ASSISTED LAPAROSCOPIC SACROCOLPOPEXY;  Surgeon: Marguerita Beards, MD;  [facility];  Service: Gynecology;  Laterality: N/A;  Total time requested 3 hours -Assist requested   SHOULDER ARTHROSCOPY WITH OPEN ROTATOR CUFF REPAIR Right 2012   TOTAL ABDOMINAL HYSTERECTOMY W/ BILATERAL OOPHORECTOMY AND LYSIS ADHESIONS  09/02/2007   TUBAL LIGATION Bilateral 1995   UMBILICAL HERNIA REPAIR  2003   unsure if she has mesh   VAGINAL HYSTERECTOMY N/A 08/18/2015   Procedure: Exam under Anesthesia,  Excision Xenform Graft, Removal Bilateral Sacrospinous Ligament sutures;  Surgeon: Patton Salles, MD;  Location: [facility];  Service: Gynecology;  Laterality: N/A;    Medications: Current Outpatient Medications  Medication Sig Dispense Refill   ALPRAZolam (XANAX) 1 MG tablet Take 1 tablet (1 mg total) by mouth 2 (two) times daily as needed for anxiety. 60 tablet 5   amphetamine-dextroamphetamine (ADDERALL XR) 20 MG 24 hr capsule  Take 1 capsule (20 mg total) by mouth every morning. 30 capsule 0   aspirin EC 81 MG tablet Take 1 tablet (81 mg total) by mouth daily. Swallow whole. 1 tablet 0   atorvastatin (LIPITOR) 20 MG tablet Take 1 tablet (20 mg total) by mouth daily. 90 tablet 3   cyanocobalamin (VITAMIN B12) 500 MCG tablet 300 mcg- 500 mcg daily     estradiol (ESTRACE) 0.1 MG/GM vaginal cream Place 0.5g nightly for two weeks then twice a week after 30 g 11   pramipexole (MIRAPEX) 1 MG tablet Take 1 tablet (1 mg total) by mouth in the morning and at bedtime. 60 tablet 11   Vitamin D, Ergocalciferol, (DRISDOL) 1.25 MG (50000 UNIT) CAPS capsule Take 1 capsule (50,000 Units total) by mouth every 7 (seven) days. 4 capsule 0   No current facility-administered medications for this visit.    Allergies  Allergen Reactions   Desvenlafaxine Other (See Comments)    Reaction:  Withdrawal    Prochlorperazine Other (See Comments)    Altered mental status   Iodinated Contrast Media Rash and Other (See Comments)    Previously mis-entered as Iohexol allergy. Patient is not allergic to Non-Ionic contrast currently.    Diagnoses:  Generalized anxiety disorder  Grief  Mild episode of recurrent major depressive disorder (HCC)  Plan of Care: pt is a 57y/o married female seeking continued counseling for anxiety and depression.  Pt has been seeking this counselor since 2023 starting w/ weekly counseling and transitioning to less frequent.  Pt had hx of anxiety tx w/ medications for several years.   Pt reports improved coping skills through counseling and reduced anxious escalations and improving conflict resolution.  Pt has struggled w/ grief and worry for her and mother's health.   Pt reports struggles w/ self esteem.  Pt reports depressive symptoms are mild currently.  Pt to f/u w/ counseling every 2-4 weeks.   F/u as scheduled w/ PCP and specialists.   Individualized Treatment Plan Strengths: enjoys walking and movement, enjoys the beach, family is important to her  Supports: her husband, her daughter in law and her mom    Goal/Needs for Treatment:  In order of importance to patient 1) cope with anxiety 2) increase self worth and decrease negative thought patterns.  3) continue cope through losses       Client Statement of Needs:   "talk about my grief, continue to work through my anxiety, try not be so negative on stuff and thinking things are going to go bad or wrong.  Finding time for self again."        Treatment Level: outpatient counseling  Symptoms:anxiety, worry, ruminating thoughts, low self worth, emotional escalations, depressed mood, sleep disturbance  Client Treatment Preferences: counseling every 2-4 weeks and continue medication management w/ PCP    Healthcare consumer's goal for treatment:   Counselor, Forde Radon, [facility] will support the patient's ability to achieve the goals identified. Cognitive Behavioral Therapy, Assertive Communication/Conflict Resolution Training, Relaxation Training, ACT, Humanistic and other evidenced-based practices will be used to promote progress towards healthy functioning.    Healthcare consumer will: Actively participate in therapy, working towards healthy functioning.     *Justification for Continuation/Discontinuation of Goal: R=Revised, O=Ongoing, A=Achieved, D=Discontinued   Goal 1) Pt will increase coping with life's anxiety and stressors AEB decreased ruminating worry, decreased emotional escalations, decreased negative  thought patterns. That contribute to anxiety. Baseline date 03/10/24: Progress towards goal 40%; How Often - Daily Target Date  Goal Was reviewed Status Code Progress towards goal  03/10/25                            Goal 2)  Increase self awareness, consistency w/ self care time and self compassion statements AEB pt report and therapist observation.   Baseline date 03/10/24: Progress towards goal 30; How Often - Daily Target Date Goal Was reviewed Status Code Progress towards goal/Likert rating  03/10/25                            Goal 3) Verbalize feelings of grief related to losses and recognize ways of living with grief present.  Baseline date 03/10/24: Progress towards goal 30; How Often - Daily Target Date Goal Was reviewed Status Code Progress towards goal/Likert rating  03/10/25                            This plan has been reviewed and created by the following participants:  This plan will be reviewed at least every 12 months. Date Behavioral Health Clinician Date Guardian/Patient   03/10/24 [facility] [name]                      03/10/24 Verbal consent provided and request for electronic signature sent and obtained                Forde Radon [facility]

## 2024-03-11 ENCOUNTER — Telehealth: Admitting: Sleep Medicine

## 2024-03-11 ENCOUNTER — Encounter: Payer: Self-pay | Admitting: Sleep Medicine

## 2024-03-11 DIAGNOSIS — G4733 Obstructive sleep apnea (adult) (pediatric): Secondary | ICD-10-CM

## 2024-03-11 DIAGNOSIS — G2581 Restless legs syndrome: Secondary | ICD-10-CM

## 2024-03-11 DIAGNOSIS — Z87891 Personal history of nicotine dependence: Secondary | ICD-10-CM

## 2024-03-11 NOTE — Progress Notes (Signed)
 Name:[name] MRN: [id number] DOB: 09-Dec-1966   CHIEF COMPLAINT:  EXCESSIVE DAYTIME SLEEPINESS   HISTORY OF PRESENT ILLNESS:  [name] is a 58 y.o. w/ a h/o anxiety, ADHD and RLS who presents via telehealth video to follow up on HST results. The patient underwent HST which revealed mild OSA (AHI 12, O2 nadir 77%).     EPWORTH SLEEP SCORE    02/14/2024    8:38 AM  Results of the Epworth flowsheet  Sitting and reading 2  Watching TV 2  Sitting, inactive in a public place (e.g. a theatre or a meeting) 0  As a passenger in a car for an hour without a break 3  Lying down to rest in the afternoon when circumstances permit 1  Sitting and talking to someone 0  Sitting quietly after a lunch without alcohol 0  In a car, while stopped for a few minutes in traffic 0  Total score 8    PAST MEDICAL HISTORY :   has a past medical history of ADHD, Allergy, Anxiety, Back pain, CAD (coronary artery disease), Constipated, Depression, Fatty liver, GERD (gastroesophageal reflux disease), History of abnormal cervical Pap smear, History of colon polyps, Hydronephrosis, left, Kidney stones (2016), OSA (obstructive sleep apnea), PONV (postoperative nausea and vomiting), RLS (restless legs syndrome), Rosacea, and Vitamin D deficiency.  has a past surgical history that includes Laparoscopic cholecystectomy (1999); Tubal ligation (Bilateral, 1995); Cystoscopy w/ ureteral stent placement (01/1999); Extracorporeal shock wave lithotripsy (2001  &  2002); DX LAPAROSCOPY (X2); EXPLORATORY LAPAROTOMY W/ BILATERAL SALPINGECTOMY AND REMOVAL RIGHT ECTOPIC PREG. (02/23/2004); TOTAL ABDOMINAL HYSTERECTOMY W/ BILATERAL OOPHORECTOMY AND LYSIS ADHESIONS (09/02/2007); Esophagogastroduodenoscopy (05/09/2007); Umbilical hernia repair (2003); Shoulder arthroscopy with open rotator cuff repair (Right, 2012); Cystoscopy with ureteroscopy and stent placement (Left, 05/18/2015); Cystoscopy with holmium laser  lithotripsy (Left, 05/18/2015); Cystoscopy with retrograde pyelogram, ureteroscopy and stent placement (Left, 06/22/2015); Cystoscopy w/ retrogrades (Bilateral, 06/22/2015); Abdominal hysterectomy; Diagnostic laparoscopy; Anterior and posterior repair with sacrospinous fixation (N/A, 08/16/2015); Bladder suspension (N/A, 08/16/2015); Cystoscopy (N/A, 08/16/2015); Vaginal hysterectomy (N/A, 08/18/2015); Polypectomy; Colonoscopy (08/21/2017); Robotic assisted laparoscopic sacrocolpopexy (N/A, 12/12/2022); Cystoscopy (N/A, 12/12/2022); Cystocele repair (N/A, 12/12/2022); and Cholecystectomy (1998). Prior to Admission medications   Medication Sig Start Date End Date Taking? Authorizing Provider  ALPRAZolam Prudy Feeler) 1 MG tablet Take 1 tablet (1 mg total) by mouth 2 (two) times daily as needed for anxiety. 07/03/23   Nelwyn Salisbury, MD  amphetamine-dextroamphetamine (ADDERALL XR) 20 MG 24 hr capsule Take 1 capsule (20 mg total) by mouth every morning. 01/31/24   Nelwyn Salisbury, MD  aspirin EC 81 MG tablet Take 1 tablet (81 mg total) by mouth daily. Swallow whole. 02/13/24   Nelwyn Salisbury, MD  atorvastatin (LIPITOR) 20 MG tablet Take 1 tablet (20 mg total) by mouth daily. 02/13/24   Nelwyn Salisbury, MD  cyanocobalamin (VITAMIN B12) 500 MCG tablet 300 mcg- 500 mcg daily 02/03/24   Thomasene Lot, DO  estradiol (ESTRACE) 0.1 MG/GM vaginal cream Place 0.5g nightly for two weeks then twice a week after 08/12/23   Marguerita Beards, MD  pramipexole (MIRAPEX) 1 MG tablet Take 1 tablet (1 mg total) by mouth in the morning and at bedtime. 09/04/23   Nelwyn Salisbury, MD  Vitamin D, Ergocalciferol, (DRISDOL) 1.25 MG (50000 UNIT) CAPS capsule Take 1 capsule (50,000 Units total) by mouth every 7 (seven) days. 02/19/24   Thomasene Lot, DO   Allergies  Allergen Reactions   Desvenlafaxine  Other (See Comments)    Reaction:  Withdrawal    Prochlorperazine Other (See Comments)    Altered mental status   Iodinated Contrast  Media Rash and Other (See Comments)    Previously mis-entered as Iohexol allergy. Patient is not allergic to Non-Ionic contrast currently.    FAMILY HISTORY:  family history includes Anxiety disorder in her brother; Breast cancer in her paternal grandmother; Cancer in her paternal uncle; Colon polyps in her father and mother; Depression in her mother; Diabetes in her father; Drug abuse in her sister; Heart attack in her maternal grandmother; Heart disease in her mother; Hypertension in her mother; Kidney disease in her mother; Obesity in her mother; Pancreatic cancer in her paternal uncle. SOCIAL HISTORY:  reports that she quit smoking about 15 years ago. Her smoking use included cigarettes. She started smoking about 30 years ago. She has never used smokeless tobacco. She reports that she does not drink alcohol and does not use drugs.   Review of Systems:  Gen:  Denies  fever, sweats, chills weight loss  HEENT: Denies blurred vision, double vision, ear pain, eye pain, hearing loss, nose bleeds, sore throat Cardiac:  No dizziness, chest pain or heaviness, chest tightness,edema, No JVD Resp:   No cough, -sputum production, -shortness of breath,-wheezing, -hemoptysis,  Gi: Denies swallowing difficulty, stomach pain, nausea or vomiting, diarrhea, constipation, bowel incontinence Gu:  Denies bladder incontinence, burning urine Ext:   Denies Joint pain, stiffness or swelling Skin: Denies  skin rash, easy bruising or bleeding or hives Endoc:  Denies polyuria, polydipsia , polyphagia or weight change Psych:   Denies depression, insomnia or hallucinations  Other:  All other systems negative  VITAL SIGNS: Unable to obtain due to video visit  Physical Examination:   General Appearance: No distress  PSYCHIATRIC: Mood, affect within normal limits.   ASSESSMENT AND PLAN  OSA Reviewed HST results with patient. Starting on APAP therapy set to 4-16 cm H2O. Discussed the consequences of untreated  sleep apnea. Advised not to drive drowsy for safety of patient and others. Will follow up in 3 months to review CPAP efficacy and compliance data.    RLS Stable, on current management. Following with PCP.    Patient  satisfied with Plan of action and management. All questions answered   I spent a total of 20 minutes reviewing chart data, face-to-face evaluation with the patient, counseling and coordination of care as detailed above.    Tempie Hoist, [name]  Sleep Medicine  Pulmonary & Critical Care Medicine

## 2024-03-12 ENCOUNTER — Ambulatory Visit (INDEPENDENT_AMBULATORY_CARE_PROVIDER_SITE_OTHER): Payer: Commercial Managed Care - PPO | Admitting: Family Medicine

## 2024-03-12 ENCOUNTER — Encounter (INDEPENDENT_AMBULATORY_CARE_PROVIDER_SITE_OTHER): Payer: Self-pay | Admitting: Family Medicine

## 2024-03-12 VITALS — BP 115/71 | HR 78 | Temp 98.1°F | Ht 66.0 in | Wt 174.0 lb

## 2024-03-12 DIAGNOSIS — R7303 Prediabetes: Secondary | ICD-10-CM | POA: Diagnosis not present

## 2024-03-12 DIAGNOSIS — E669 Obesity, unspecified: Secondary | ICD-10-CM

## 2024-03-12 DIAGNOSIS — G4733 Obstructive sleep apnea (adult) (pediatric): Secondary | ICD-10-CM

## 2024-03-12 DIAGNOSIS — E559 Vitamin D deficiency, unspecified: Secondary | ICD-10-CM | POA: Diagnosis not present

## 2024-03-12 DIAGNOSIS — Z6828 Body mass index (BMI) 28.0-28.9, adult: Secondary | ICD-10-CM

## 2024-03-12 MED ORDER — VITAMIN D (ERGOCALCIFEROL) 1.25 MG (50000 UNIT) PO CAPS: 50000.0000 [IU] | ORAL_CAPSULE | ORAL | 0 refills | Status: DC

## 2024-03-12 MED ORDER — TIRZEPATIDE-WEIGHT MANAGEMENT 2.5 MG/0.5ML ~~LOC~~ SOLN: 2.5000 mg | SUBCUTANEOUS | 0 refills | Status: DC

## 2024-03-12 NOTE — Progress Notes (Signed)
 [name], D.O.  ABFM, ABOM Specializing in Clinical Bariatric Medicine  Office located at: [address] [place], [place]  [address]   Assessment and Plan:   No orders of the defined types were placed in this encounter.   Medications Discontinued During This Encounter  Medication Reason   Vitamin D, Ergocalciferol, (DRISDOL) 1.25 MG (50000 UNIT) CAPS capsule Reorder     Meds ordered this encounter  Medications   Vitamin D, Ergocalciferol, (DRISDOL) 1.25 MG (50000 UNIT) CAPS capsule    Sig: Take 1 capsule (50,000 Units total) by mouth every 7 (seven) days.    Dispense:  4 capsule    Refill:  0   tirzepatide (ZEPBOUND) 2.5 MG/0.5ML injection vial    Sig: Inject 2.5 mg into the skin once a week.    Dispense:  2 mL    Refill:  0      FOR THE DISEASE OF OBESITY: BMI 28.0-28.9,adult - Current BMI 28.1 Obesity (BMI 30-39.9) - Starting BMI 30.52 Assessment & Plan: Since last office visit on 02/19/24 patient's muscle mass has decreased by 2.2 lb. Fat mass has decreased by 4.4 lb. Total body water has decreased by 1.2 lb.  Counseling done on how various foods will affect these numbers and how to maximize success  Total lbs lost to date: 15 lbs Total weight loss percentage to date: -7.94%    Recommended Dietary Goals [name] is currently in the action stage of change. As such, her goal is to continue weight management plan.  She has agreed to: continue current plan   Behavioral Intervention We discussed the following today: increasing lean protein intake to established goals, decreasing simple carbohydrates , and staying on track while traveling and vacationing  Additional resources provided today: None  Evidence-based interventions for health behavior change were utilized today including the discussion of self monitoring techniques, problem-solving barriers and SMART goal setting techniques.   Regarding patient's less desirable eating habits and patterns, we  employed the technique of small changes.   Pt will specifically work on: n/a   Recommended Physical Activity Goals Curtisha has been advised to work up to 150 minutes of moderate intensity aerobic activity a week and strengthening exercises 2-3 times per week for cardiovascular health, weight loss maintenance and preservation of muscle mass.   She has agreed to :  Continue current level of physical activity , Think about enjoyable ways to increase daily physical activity and overcoming barriers to exercise, and Increase physical activity in their day and reduce sedentary time (increase NEAT).   Pharmacotherapy We both agreed to: continue with nutritional and behavioral strategies and Will start Zepbound if covered by insurance.    FOR ASSOCIATED CONDITIONS ADDRESSED TODAY: BMI 28.0-28.9,adult - Current BMI 28.1 Obesity (BMI 30-39.9) - Starting BMI 30.52 OSA (obstructive sleep apnea) Assessment & Plan: Pt followed up with Dr. Tempie Hoist of pulmonology yesterday. Pt has OSA. Per Dr. Jae Dire note: "The patient underwent HST which revealed mild OSA (AHI 12, O2 nadir 77%)." The following was recommended: "Starting on APAP therapy set to 4-16 cm H2O. Discussed the consequences of untreated sleep apnea. Advised not to drive drowsy for safety of patient and others. Will follow up in 3 months to review CPAP efficacy and compliance data."    Pt has been part of our medical weight loss program since 01/20/24. Several years ago she lost about 10-12 lbs with Phentermine, but eventually hit a plateau, thus she stopped taking as a result of minimal  clinical effectiveness for weight loss. Has been engaging in modification of diet and lifestyle to enhance her weight loss. She would benefit from an anti-obesity medication along with dietary lifestyle changes.   Pt is interested in starting weight loss medications, states it has previously not been covered by insurance but would like to try again. [name]  understands there is a chance her insurance may not cover Zepbound.  Reviewed the benefits of Zepbound as well as any possible risks/side effects. Mutually agreed to order Zepbound today. Pt denies any contraindications to Zepbound. Will continue to monitor condition alongside her PCP/specialists.   Orders: - Start Zepbound 2.5 mg once weekly.    Vitamin D deficiency - new onset Assessment & Plan: Lab Results  Component Value Date   VD25OH 33.4 01/20/2024   VD25OH 37.16 04/04/2020   VD25OH 28.91 (L) 01/26/2020   Last vitamin D was below goal at 33.4 on 01/20/24. Pt is on ERGO 50K units once weekly. Good compliance and tolerance with no adverse SE reported. No acute concerns in this regard today.   Continue with current supplementation today. Will refill ERGO with no dose changes today. Will continue to monitor condition as it pertains to her weight loss journey.   Orders: - Refill ERGO, no dose changes   Prediabetes Assessment & Plan: Lab Results  Component Value Date   HGBA1C 5.5 01/20/2024   HGBA1C 5.7 09/04/2023   HGBA1C 5.6 01/21/2023   INSULIN 11.2 01/20/2024    Not currently on any meds. Diet/exercise approach. Hunger and cravings are uncontrolled and endorses emotional eating. She will be following up with Dr. Dewaine Conger on Monday 03/16/24.   Continue working on weight loss with exercise and following her nutritional meal plan. Encouraged pt to increase her protein intake. Will plan to recheck her A1C and fasting insulin in 3 months from last obtained labs.    Follow up:   Return in about 2 weeks (around 03/26/2024) for Make patient 2 appointments. . She was informed of the importance of frequent follow up visits to maximize her success with intensive lifestyle modifications for her multiple health conditions.  Subjective:   Chief complaint: Obesity [name] is here to discuss her progress with her obesity treatment plan. She is on the Category 1 Plan with B/L options and  states she is following her eating plan approximately 95% of the time. She states she is biking and weight lifting 30 minutes 2 days per week.   Interval History:  [name] is here for a follow up office visit. Since last OV on 02/19/24, she is down 6 lbs. She endorses some emotional eating and sweets cravings. Has been eating protein cereals. Reports she will be going on a trip via train soon.   Pharmacotherapy for weight loss: She is currently taking no anti-obesity medication.   Review of Systems:  Pertinent positives were addressed with patient today.  Reviewed by clinician on day of visit: allergies, medications, problem list, medical history, surgical history, family history, social history, and previous encounter notes.  Weight Summary and Biometrics   Weight Lost Since Last Visit: 6 lb  Weight Gained Since Last Visit: 0   Vitals Temp: 98.1 F (36.7 C) BP: 115/71 Pulse Rate: 78 SpO2: 100 %   Anthropometric Measurements Height: 5\' 6"  (1.676 m) Weight: 174 lb (78.9 kg) BMI (Calculated): 28.1 Weight at Last Visit: 180 lb Weight Lost Since Last Visit: 6 lb Weight Gained Since Last Visit: 0 Starting Weight: 189 lb Total Weight Loss (  lbs): 15 lb (6.804 kg) Peak Weight: 250 lb   Body Composition  Body Fat %: 39.5 % Fat Mass (lbs): 68.8 lbs Muscle Mass (lbs): 100 lbs Total Body Water (lbs): 71 lbs Visceral Fat Rating : 9   Other Clinical Data Fasting: No Labs: No Today's Visit #: 4 Starting Date: 01/20/24    Objective:   PHYSICAL EXAM: Blood pressure 115/71, pulse 78, temperature 98.1 F (36.7 C), height 5\' 6"  (1.676 m), weight 174 lb (78.9 kg), last menstrual period 12/24/2006, SpO2 100%. Body mass index is 28.08 kg/m.  General: she is overweight, cooperative and in no acute distress. PSYCH: Has normal mood, affect and thought process.   HEENT: EOMI, sclerae are anicteric. Lungs: Normal breathing effort, no conversational  dyspnea. Extremities: Moves * 4 Neurologic: A and O * 3, good insight  DIAGNOSTIC DATA REVIEWED: BMET    Component Value Date/Time   NA 143 01/20/2024 0953   K 4.5 01/20/2024 0953   CL 105 01/20/2024 0953   CO2 24 01/20/2024 0953   GLUCOSE 97 01/20/2024 0953   GLUCOSE 95 09/04/2023 0837   BUN 13 01/20/2024 0953   CREATININE 0.72 01/20/2024 0953   CALCIUM 9.9 01/20/2024 0953   GFRNONAA >60 06/08/2022 2009   GFRAA >60 11/15/2017 2142   Lab Results  Component Value Date   HGBA1C 5.5 01/20/2024   HGBA1C 5.6 10/17/2015   Lab Results  Component Value Date   INSULIN 11.2 01/20/2024   Lab Results  Component Value Date   TSH 2.190 01/20/2024   CBC    Component Value Date/Time   WBC 4.7 01/20/2024 0953   WBC 4.9 09/04/2023 0837   RBC 4.87 01/20/2024 0953   RBC 4.63 09/04/2023 0837   HGB 14.3 01/20/2024 0953   HCT 42.3 01/20/2024 0953   PLT 253 01/20/2024 0953   MCV 87 01/20/2024 0953   MCH 29.4 01/20/2024 0953   MCH 28.4 06/08/2022 2009   MCHC 33.8 01/20/2024 0953   MCHC 32.6 09/04/2023 0837   RDW 12.8 01/20/2024 0953   Iron Studies    Component Value Date/Time   IRON 89 11/14/2022 1321   TIBC 326.2 11/14/2022 1321   FERRITIN 87.8 11/14/2022 1321   IRONPCTSAT 27.3 11/14/2022 1321   Lipid Panel     Component Value Date/Time   CHOL 213 (H) 01/20/2024 0953   TRIG 100 01/20/2024 0953   HDL 54 01/20/2024 0953   CHOLHDL 3 09/04/2023 0837   VLDL 17.0 09/04/2023 0837   LDLCALC 141 (H) 01/20/2024 0953   LDLDIRECT 118.0 12/28/2019 1323   Hepatic Function Panel     Component Value Date/Time   PROT 7.1 01/20/2024 0953   ALBUMIN 4.5 01/20/2024 0953   AST 15 01/20/2024 0953   ALT 13 01/20/2024 0953   ALKPHOS 101 01/20/2024 0953   BILITOT 0.3 01/20/2024 0953   BILIDIR 0.1 09/04/2023 0837      Component Value Date/Time   TSH 2.190 01/20/2024 0953   Nutritional Lab Results  Component Value Date   VD25OH 33.4 01/20/2024   VD25OH 37.16 04/04/2020   VD25OH  28.91 (L) 01/26/2020    Attestations:   Burnett Sheng, acting as a medical scribe for Thomasene Lot, DO., have compiled all relevant documentation for today's office visit on behalf of Thomasene Lot, DO, while in the presence of [employer], DO.  Reviewed by clinician on day of visit: allergies, medications, problem list, medical history, surgical history, family history, social history, and previous encounter notes pertinent to  patient's obesity diagnosis.  I have reviewed the above documentation for accuracy and completeness, and I agree with the above. [name], D.O.  The 21st Century Cures Act was signed into law in 2016 which includes the topic of electronic health records.  This provides immediate access to information in MyChart.  This includes consultation notes, operative notes, office notes, lab results and pathology reports.  If you have any questions about what you read please let us know at your next visit so we can discuss your concerns and take corrective action if need be.  We are right here with you.

## 2024-03-16 ENCOUNTER — Encounter (INDEPENDENT_AMBULATORY_CARE_PROVIDER_SITE_OTHER): Payer: Commercial Managed Care - PPO | Admitting: Psychology

## 2024-03-16 ENCOUNTER — Telehealth (INDEPENDENT_AMBULATORY_CARE_PROVIDER_SITE_OTHER): Payer: Self-pay | Admitting: *Deleted

## 2024-03-16 ENCOUNTER — Ambulatory Visit: Payer: Self-pay | Admitting: Internal Medicine

## 2024-03-16 ENCOUNTER — Telehealth (INDEPENDENT_AMBULATORY_CARE_PROVIDER_SITE_OTHER): Payer: Self-pay | Admitting: Psychology

## 2024-03-16 NOTE — Telephone Encounter (Signed)
 Maurilio Lovely (Key: [facility])  form thumbnail Your information has been sent to [facility].

## 2024-03-16 NOTE — Progress Notes (Signed)
 Entered in error

## 2024-03-16 NOTE — Telephone Encounter (Signed)
 PA denied due to plan exclusion. Patient notified via Mychart message.

## 2024-03-16 NOTE — Telephone Encounter (Signed)
  Office: [phone]  /  Fax: [phone]  Date of Encounter: March 16, 2024  Time of Encounter: 12;03pm Duration of Encounter: ~4 minute(s) Provider: Lawerance Cruel, PsyD  CONTENT:  Angelique Blonder presented for today's appointment via MyChart Video Visit. She disclosed she was visiting her mother in [place]. This provider explained the appointment would have to be rescheduled as both provider and patient have to be present in [place]. Brittain acknowledged understanding and was receptive to rescheduling. She was also receptive to the clinic calling her if an earlier appointment becomes available. No evidence or endorsement of safety concerns. All questions/concerns addressed.   PLAN: Aneri is scheduled for an appointment on 04/13/2024 at 12pm via MyChart Video Visit.

## 2024-03-20 ENCOUNTER — Telehealth: Payer: Self-pay | Admitting: Sleep Medicine

## 2024-03-20 NOTE — Telephone Encounter (Signed)
 I received a response from [place] with [employer], Elige Radon   The order was recieved 03-13-24 and just passed auth review this moring. The patient is assigned to pap scheduling at this time and She should get a call to discuss her setup soon.

## 2024-03-20 NOTE — Telephone Encounter (Signed)
 I have sent urgent message to [employer] to check on this issue order was just placed on 03/11/24

## 2024-03-20 NOTE — Telephone Encounter (Signed)
 Copied from CRM [id number]. Topic: General - Other >> Mar 20, 2024 11:12 AM Gaetano Hawthorne wrote: Reason for CRM: Patient of Dr. Reddy's calling because they have not heard from the DME company regarding the new CPAP machine - she was informed it would not take that long - Can we look to see what the status of her order and when she will be contacted?  Contact Center agent found a referral with notes in it - Can we follow up with DME company now that the order has been received and reach back out to the patient when we have an update?

## 2024-03-24 NOTE — Progress Notes (Unsigned)
 Cardiology Office Note:    Date:  03/27/2024   ID:  [name], DOB [birth year], MRN [id number]  PCP:  Nelwyn Salisbury, MD  Cardiologist:  Little Ishikawa, MD  Electrophysiologist:  None   Referring MD: Nelwyn Salisbury, MD   Chief Complaint  Patient presents with   Coronary Artery Disease    History of Present Illness:    [name] is a 58 y.o. female with a hx of CAD, GERD, OSA who is referred by Dr. Clent Ridges for evaluation of CAD.  Calcium score on 02/10/2024 was 843 (99th percentile).  She reports he has been having dyspnea on exertion and chest tightness.  States that chest tightness occurs when she is stressed.  Also having dyspnea with walking up stairs.  Denies any lightheadedness, syncope, lower extremity edema.  Reports occasional palpitations that she describes as feeling like her heart is skipping beat, just lasts for seconds.  She smoked for 25 years, less than 1 pack/day, quit age 73.  Family history includes maternal grandmother died of MI at 33, father had A-fib and had MI in 39s and thinks may have died of MI in 72s, and mother has A-fib.    Past Medical History:  Diagnosis Date   ADHD    Allergy    Anxiety    Back pain    CAD (coronary artery disease)    Constipated    Depression    Fatty liver    GERD (gastroesophageal reflux disease)    History of abnormal cervical Pap smear    1991 -- 1995--hx cryotherapy to cervix   History of colon polyps    2008- BENIGN   Hydronephrosis, left    Kidney stones 2016   OSA (obstructive sleep apnea)    PONV (postoperative nausea and vomiting)    RLS (restless legs syndrome)    Rosacea    Vitamin D deficiency     Past Surgical History:  Procedure Laterality Date   ABDOMINAL HYSTERECTOMY     ANTERIOR AND POSTERIOR REPAIR WITH SACROSPINOUS FIXATION N/A 08/16/2015   Procedure: ANTERIOR COLPORRHAPHY WITH XENOFORM GRAFT AND SACROSPINOUS FIXATION;  Surgeon: Patton Salles, MD;  Location: [facility];  Service: Gynecology;  Laterality: N/A;  2.5 hours OR time   BLADDER SUSPENSION N/A 08/16/2015   Procedure: TRANSVAGINAL TAPE (TVT) PROCEDURE exact midurethral sling;  Surgeon: Patton Salles, MD;  Location: [facility];  Service: Gynecology;  Laterality: N/A;   CHOLECYSTECTOMY  1998   COLONOSCOPY  08/21/2017   per Dr. Christella Hartigan, clear, repeat in 10 yrs   CYSTOCELE REPAIR N/A 12/12/2022   Procedure: ANTERIOR REPAIR (CYSTOCELE);  Surgeon: Marguerita Beards, MD;  Location: [facility];  Service: Gynecology;  Laterality: N/A;   CYSTOSCOPY N/A 08/16/2015   Procedure: CYSTOSCOPY;  Surgeon: Patton Salles, MD;  Location: [facility];  Service: Gynecology;  Laterality: N/A;   CYSTOSCOPY N/A 12/12/2022   Procedure: CYSTOSCOPY;  Surgeon: Marguerita Beards, MD;  Location: [facility];  Service: Gynecology;  Laterality: N/A;   CYSTOSCOPY W/ RETROGRADES Bilateral 06/22/2015   Procedure: CYSTOSCOPY WITH RETROGRADE PYELOGRAM;  Surgeon: Malen Gauze, MD;  Location: [facility];  Service: Urology;  Laterality: Bilateral;   CYSTOSCOPY W/ URETERAL STENT PLACEMENT  01/1999   CYSTOSCOPY WITH HOLMIUM LASER LITHOTRIPSY Left 05/18/2015   Procedure: CYSTOSCOPY WITH HOLMIUM LASER LITHOTRIPSY;  Surgeon: Sebastian Ache, MD;  Location: [facility];  Service:  Urology;  Laterality: Left;   CYSTOSCOPY WITH RETROGRADE PYELOGRAM, URETEROSCOPY AND STENT PLACEMENT Left 06/22/2015   Procedure: CYSTOSCOPY,  LEFT URETEROSCOPY;  Surgeon: Malen Gauze, MD;  Location: [facility];  Service: Urology;  Laterality: Left;   CYSTOSCOPY WITH URETEROSCOPY AND STENT PLACEMENT Left 05/18/2015   Procedure: CYSTOSCOPY WITH URETEROSCOPY, STONE MANIPULATION AND STENT PLACEMENT;  Surgeon: Sebastian Ache, MD;  Location: [facility];  Service: Urology;  Laterality: Left;   DIAGNOSTIC LAPAROSCOPY     DX LAPAROSCOPY  X2    ESOPHAGOGASTRODUODENOSCOPY  05/09/2007   EXPLORATORY LAPAROTOMY W/ BILATERAL SALPINGECTOMY AND REMOVAL RIGHT ECTOPIC PREG.  02/23/2004   EXTRACORPOREAL SHOCK WAVE LITHOTRIPSY  2001  &  2002   LAPAROSCOPIC CHOLECYSTECTOMY  1999   POLYPECTOMY     ROBOTIC ASSISTED LAPAROSCOPIC SACROCOLPOPEXY N/A 12/12/2022   Procedure: XI ROBOTIC ASSISTED LAPAROSCOPIC SACROCOLPOPEXY;  Surgeon: Marguerita Beards, MD;  Location: [facility];  Service: Gynecology;  Laterality: N/A;  Total time requested 3 hours -Assist requested   SHOULDER ARTHROSCOPY WITH OPEN ROTATOR CUFF REPAIR Right 2012   TOTAL ABDOMINAL HYSTERECTOMY W/ BILATERAL OOPHORECTOMY AND LYSIS ADHESIONS  09/02/2007   TUBAL LIGATION Bilateral 1995   UMBILICAL HERNIA REPAIR  2003   unsure if she has mesh   VAGINAL HYSTERECTOMY N/A 08/18/2015   Procedure: Exam under Anesthesia, Excision Xenform Graft, Removal Bilateral Sacrospinous Ligament sutures;  Surgeon: Patton Salles, MD;  Location: [facility];  Service: Gynecology;  Laterality: N/A;    Current Medications: Current Meds  Medication Sig   ALPRAZolam (XANAX) 1 MG tablet Take 1 tablet (1 mg total) by mouth 2 (two) times daily as needed for anxiety.   amphetamine-dextroamphetamine (ADDERALL XR) 20 MG 24 hr capsule Take 1 capsule (20 mg total) by mouth every morning.   aspirin EC 81 MG tablet Take 1 tablet (81 mg total) by mouth daily. Swallow whole.   cyanocobalamin (VITAMIN B12) 500 MCG tablet 300 mcg- 500 mcg daily   estradiol (ESTRACE) 0.1 MG/GM vaginal cream Place 0.5g nightly for two weeks then twice a week after   nitroGLYCERIN (NITROSTAT) 0.4 MG SL tablet Place 1 tablet (0.4 mg total) under the tongue every 5 (five) minutes as needed for chest pain. 3 doses max in a 24 hour period   Vitamin D, Ergocalciferol, (DRISDOL) 1.25 MG (50000 UNIT) CAPS capsule Take 1 capsule (50,000 Units total) by mouth every 7 (seven) days.   [DISCONTINUED] atorvastatin (LIPITOR) 20 MG  tablet Take 1 tablet (20 mg total) by mouth daily.     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  Tobacco Use   Smoking status: Former    Current packs/day: 0.00    Types: Cigarettes    Start date: 05/16/1993    Quit date: 05/16/2008    Years since quitting: 15.8   Smokeless tobacco: Never  Vaping Use   Vaping status: Never Used  Substance and Sexual Activity   Alcohol use: No    Alcohol/week: 0.0 standard drinks of alcohol   Drug use: No   Sexual activity: Yes    Partners: Male    Birth control/protection: Surgical  Comment: Hyst  Other Topics Concern   Not on file  Social History Narrative   Not on file   Social Drivers of Health   Financial Resource Strain: Low Risk  (10/14/2023)   Overall Financial Resource Strain (CARDIA)    Difficulty of Paying Living Expenses: Not hard at all  Food Insecurity: No Food Insecurity (10/14/2023)   Hunger Vital Sign    Worried About Running Out of Food in the Last Year: Never true    Ran Out of Food in the Last Year: Never true  Transportation Needs: No Transportation Needs (10/14/2023)   PRAPARE - Administrator, Civil Service (Medical): No    Lack of Transportation (Non-Medical): No  Physical Activity: Insufficiently Active (10/14/2023)   Exercise Vital Sign    Days of Exercise per Week: 1 day    Minutes of Exercise per Session: 20 min  Stress: Stress Concern Present (10/14/2023)   [employer] of Occupational Health - Occupational Stress Questionnaire    Feeling of Stress : Rather much  Social Connections: Moderately Integrated (10/14/2023)   Social Connection  and Isolation Panel [NHANES]    Frequency of Communication with Friends and Family: More than three times a week    Frequency of Social Gatherings with Friends and Family: More than three times a week    Attends Religious Services: 1 to 4 times per year    Active Member of [employer] or Organizations: No    Attends Engineer, structural: Not on file    Marital Status: Married     Family History: The patient's family history includes Anxiety disorder in her brother; Breast cancer in her paternal grandmother; Cancer in her paternal uncle; Colon polyps in her father and mother; Depression in her mother; Diabetes in her father; Drug abuse in her sister; Heart attack in her maternal grandmother; Heart disease in her mother; Hypertension in her mother; Kidney disease in her mother; Obesity in her mother; Pancreatic cancer in her paternal uncle. There is no history of Esophageal cancer, Stomach cancer, Rectal cancer, or Colon cancer.  ROS:   Please see the history of present illness.     All other systems reviewed and are negative.  EKGs/Labs/Other Studies Reviewed:    The following studies were reviewed today:   EKG:   03/27/2024: Normal sinus rhythm, low voltage, rate 75, poor R wave progression, less than 1 mm ST depressions in inferior leads  Recent Labs: 01/20/2024: ALT 13; BUN 13; Creatinine, Ser 0.72; Hemoglobin 14.3; Platelets 253; Potassium 4.5; Sodium 143; TSH 2.190  Recent Lipid Panel    Component Value Date/Time   CHOL 213 (H) 01/20/2024 0953   TRIG 100 01/20/2024 0953   HDL 54 01/20/2024 0953   CHOLHDL 3 09/04/2023 0837   VLDL 17.0 09/04/2023 0837   LDLCALC 141 (H) 01/20/2024 0953   LDLDIRECT 118.0 12/28/2019 1323    Physical Exam:    VS:  BP 110/80   Pulse 75   Ht 5\' 6"  (1.676 m)   Wt 177 lb 9.6 oz (80.6 kg)   LMP 12/24/2006 (Approximate)   SpO2 98%   BMI 28.67 kg/m     Wt Readings from Last 3 Encounters:  03/27/24 177 lb 9.6 oz (80.6 kg)  03/26/24 172 lb (78  kg)  03/12/24 174 lb (78.9 kg)     GEN:  Well nourished, well developed in no acute distress HEENT: Normal NECK: No JVD; No carotid bruits LYMPHATICS: No lymphadenopathy CARDIAC: RRR, no murmurs, rubs,  gallops RESPIRATORY:  Clear to auscultation without rales, wheezing or rhonchi  ABDOMEN: Soft, non-tender, non-distended MUSCULOSKELETAL:  No edema; No deformity  SKIN: Warm and dry NEUROLOGIC:  Alert and oriented x 3 PSYCHIATRIC:  Normal affect   ASSESSMENT:    1. Elevated coronary artery calcium score   2. Precordial chest pain   3. DOE (dyspnea on exertion)   4. Hyperlipidemia, unspecified hyperlipidemia type    PLAN:    CAD: Calcium score on 02/10/2024 was 843 (99th percentile).  Reports chest pain/dyspnea occurs when she is stressed, suggesting possible angina. -Continue aspirin 81 mg daily -Continue atorvastatin, will increase to 40 mg daily -Stress PET to evaluate for ischemia -Echocardiogram Duralde structural heart disease -Will prescribe as needed sublingual nitroglycerin until obtain stress test.  Discussed that if having chest pain that does not resolve after 5 minutes, can take nitroglycerin; if does not resolve after another 5 minutes can take a second nitroglycerin but if not resolving after 2 nitroglycerin, should go to ED  Hyperlipidemia: LDL 141 on 01/20/2024.  Started on atorvastatin 20 mg, recommend increasing to 40 mg daily  OSA: recently diagnosed, starting on CPAP  RTC in 3 months  Informed Consent   Shared Decision Making/Informed Consent The risks [chest pain, shortness of breath, cardiac arrhythmias, dizziness, blood pressure fluctuations, myocardial infarction, stroke/transient ischemic attack, nausea, vomiting, allergic reaction, radiation exposure, metallic taste sensation and life-threatening complications (estimated to be 1 in 10,000)], benefits (risk stratification, diagnosing coronary artery disease, treatment guidance) and alternatives of a  cardiac PET stress test were discussed in detail with Ms. [name] and she agrees to proceed.      Medication Adjustments/Labs and Tests Ordered: Current medicines are reviewed at length with the patient today.  Concerns regarding medicines are outlined above.  Orders Placed This Encounter  Procedures   NM PET CT CARDIAC PERFUSION MULTI W/ABSOLUTE BLOODFLOW   EKG 12-Lead   ECHOCARDIOGRAM COMPLETE   Meds ordered this encounter  Medications   atorvastatin (LIPITOR) 40 MG tablet    Sig: Take 1 tablet (40 mg total) by mouth daily.    Dispense:  90 tablet    Refill:  3   nitroGLYCERIN (NITROSTAT) 0.4 MG SL tablet    Sig: Place 1 tablet (0.4 mg total) under the tongue every 5 (five) minutes as needed for chest pain. 3 doses max in a 24 hour period    Dispense:  25 tablet    Refill:  3    Patient Instructions  Medication Instructions:  Increase:  Atorvastatin (Lipitor) to 40 mg once daily  Start:  Nitroglycerin (Nitrostat), 0.4 mg, one tablet, under the tongue every five minutes as needed for Chest Pain; Max of 3 doses in one 24 hour period, please call 911 or have someone take you to the ER if you have to take a third dose.  Lab Work: None  Testing/Procedures: Your physician has requested that you have an echocardiogram. Echocardiography is a painless test that uses sound waves to create images of your heart. It provides your doctor with information about the size and shape of your heart and how well your heart's chambers and valves are working. This procedure takes approximately one hour. There are no restrictions for this procedure. Please do NOT wear cologne, perfume, aftershave, or lotions (deodorant is allowed). Please arrive 15 minutes prior to your appointment time.       Please report to Radiology at the [facility] Main Entrance 30 minutes early for your test.  [address] [place], [place] [address]                         OR   Please report to Radiology  at [facility] Main Entrance, medical mall, 30 mins prior to your test.  [address]  [place], [place]  How to Prepare for Your Cardiac PET/CT Stress Test:  Nothing to eat or drink, except water, 3 hours prior to arrival time.  NO caffeine/decaffeinated products, or chocolate 12 hours prior to arrival. (Please note decaffeinated beverages (teas/coffees) still contain caffeine).  If you have caffeine within 12 hours prior, the test will need to be rescheduled.  Medication instructions: Do not take nitrates (isosorbide mononitrate, Ranexa) the day before or day of test Do not take tamsulosin the day before or morning of test Hold theophylline containing medications for 12 hours. Hold Dipyridamole 48 hours prior to the test.  Diabetic Preparation: If able to eat breakfast prior to 3 hour fasting, you may take all medications, including your insulin. Do not worry if you miss your breakfast dose of insulin - start at your next meal. If you do not eat prior to 3 hour fast-Hold all diabetes (oral and insulin) medications. Patients who wear a continuous glucose monitor MUST remove the device prior to scanning.  You may take your remaining medications with water.  NO perfume, cologne or lotion on chest or abdomen area. FEMALES - Please avoid wearing dresses to this appointment.  Total time is 1 to 2 hours; you may want to bring reading material for the waiting time.  IF YOU THINK YOU MAY BE PREGNANT, OR ARE NURSING PLEASE INFORM THE TECHNOLOGIST.  In preparation for your appointment, medication and supplies will be purchased.  Appointment availability is limited, so if you need to cancel or reschedule, please call the Radiology Department Scheduler at [phone] 24 hours in advance to avoid a cancellation fee of $100.00  What to Expect When you Arrive:  Once you arrive and check in for your appointment, you will be taken to a preparation room within the  Radiology Department.  A technologist or Nurse will obtain your medical history, verify that you are correctly prepped for the exam, and explain the procedure.  Afterwards, an IV will be started in your arm and electrodes will be placed on your skin for EKG monitoring during the stress portion of the exam. Then you will be escorted to the PET/CT scanner.  There, staff will get you positioned on the scanner and obtain a blood pressure and EKG.  During the exam, you will continue to be connected to the EKG and blood pressure machines.  A small, safe amount of a radioactive tracer will be injected in your IV to obtain a series of pictures of your heart along with an injection of a stress agent.    After your Exam:  It is recommended that you eat a meal and drink a caffeinated beverage to counter act any effects of the stress agent.  Drink plenty of fluids for the remainder of the day and urinate frequently for the first couple of hours after the exam.  Your doctor will inform you of your test results within 7-10 business days.  For more information and frequently asked questions, please visit our website: [email or web address]  For questions about your test or how to prepare for your test, please call: Cardiac Imaging Nurse Navigators Office: [phone]    Follow-Up:  At [facility], you and your health needs are our priority.  As part of our continuing mission to provide you with exceptional heart care, our providers are all part of one team.  This team includes your primary Cardiologist (physician) and Advanced Practice Providers or APPs (Physician Assistants and Nurse Practitioners) who all work together to provide you with the care you need, when you need it.  Your next appointment:   3 month(s)  Provider:   Little Ishikawa, MD        Valet parking services will be available as well.      Signed, Little Ishikawa, MD  03/27/2024 4:02 PM    [facility]

## 2024-03-25 ENCOUNTER — Ambulatory Visit (INDEPENDENT_AMBULATORY_CARE_PROVIDER_SITE_OTHER): Admitting: Psychology

## 2024-03-25 DIAGNOSIS — F4321 Adjustment disorder with depressed mood: Secondary | ICD-10-CM

## 2024-03-25 DIAGNOSIS — F411 Generalized anxiety disorder: Secondary | ICD-10-CM

## 2024-03-25 NOTE — Progress Notes (Signed)
 [facility] Counselor/Therapist Progress Note  Patient ID: [name], MRN: [id number],    Date: 03/25/24   Time Spent: 12:00pm-12:59pm  Treatment Type: Individual Therapy  Pt is seen for a virtual video visit via caregility. Pt consents to virtual visit and is aware of limitations of virtual visits.  Pt joins from her car/home, reporting privacy, and counselor from her home office.     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   Mental Status Exam: Appearance:  Well Groomed     Behavior: Appropriate  Motor: Normal  Speech/Language:  Normal Rate  Affect: Appropriate  Mood: anxious  Thought process: normal  Thought content:   WNL  Sensory/Perceptual disturbances:   WNL  Orientation: oriented to person, place, time/date, and situation  Attention: Good  Concentration: Good  Memory: WNL  Fund of knowledge:  Good  Insight:   Good  Judgment:  Good  Impulse Control: Good   Risk Assessment: Danger to Self:  No Self-injurious Behavior: No Danger to Others: No Duty to Warn:no Physical Aggression / Violence:No  Access to Firearms a concern: No  Gang Involvement:No   Subjective: Counselor assessed pt current functioning per pt report.  Processed w/pt stressors and feeling overwhelmed.  Explored w/ pt recognizing her limitations.  Discussed awareness of struggles w/ change of plans and emotional escalations.  Discussed taking time for self before responds to requests/change of plans. Pt affect wnl.  Pt reports had good visit w/ her mom.  Pt reports that planning to visit mother in law next weekend.  Pt reports she feels stressed and overwhelmed balancing full time job and supporting husband's business.  Pt recognizing limitations and not taking on more than able.  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  Pt discussed concern for her mom's and mother in law's health.   Pt  shared awareness of emotional escalations when change in plans and taking ownership for reactivity and impact has on her relationships.  Pt discussed ways of taking pause to process and deescalate self before responding.    Interventions: Cognitive Behavioral Therapy, Mindfulness Meditation, and supportive  Diagnosis:Generalized anxiety disorder  Grief  Plan: Pt to f/u w/ Counseling in 3 weeks.  Pt to f/u w/ PCP, Cardiologist and [facility] as scheduled.   Individualized Treatment Plan Strengths: enjoys walking and movement, enjoys the beach, family is important to her  Supports: her husband, her daughter in law and her mom    Goal/Needs for Treatment:  In order of importance to patient 1) cope with anxiety 2) increase self worth and decrease negative thought patterns.  3) continue cope through losses       Client Statement of Needs:   "talk about my grief, continue to work through my anxiety, try not be so negative on stuff and thinking things are going to go bad or wrong.  Finding time for self again."        Treatment Level: outpatient counseling  Symptoms:anxiety, worry, ruminating thoughts, low self worth, emotional escalations, depressed mood, sleep disturbance  Client Treatment Preferences: counseling every 2-4 weeks and continue medication management w/ PCP    Healthcare consumer's goal for treatment:   Counselor, Forde Radon, [facility] will support the patient's ability to achieve the goals identified. Cognitive Behavioral Therapy, Assertive Communication/Conflict Resolution Training, Relaxation Training, ACT, Humanistic and other evidenced-based practices will be used to promote progress  towards healthy functioning.    Healthcare consumer will: Actively participate in therapy, working towards healthy functioning.     *Justification for Continuation/Discontinuation of Goal: R=Revised, O=Ongoing, A=Achieved, D=Discontinued   Goal 1) Pt will increase coping  with life's anxiety and stressors AEB decreased ruminating worry, decreased emotional escalations, decreased negative thought patterns. That contribute to anxiety. Baseline date 03/10/24: Progress towards goal 40%; How Often - Daily Target Date Goal Was reviewed Status Code Progress towards goal  03/10/25                            Goal 2)  Increase self awareness, consistency w/ self care time and self compassion statements AEB pt report and therapist observation.   Baseline date 03/10/24: Progress towards goal 30; How Often - Daily Target Date Goal Was reviewed Status Code Progress towards goal/Likert rating  03/10/25                            Goal 3) Verbalize feelings of grief related to losses and recognize ways of living with grief present.  Baseline date 03/10/24: Progress towards goal 30; How Often - Daily Target Date Goal Was reviewed Status Code Progress towards goal/Likert rating  03/10/25                            This plan has been reviewed and created by the following participants:  This plan will be reviewed at least every 12 months. Date Behavioral Health Clinician Date Guardian/Patient   03/10/24 [facility] [name]                      03/10/24 Verbal consent provided and request for electronic signature sent and obtained              Forde Radon [facility]

## 2024-03-26 ENCOUNTER — Other Ambulatory Visit (INDEPENDENT_AMBULATORY_CARE_PROVIDER_SITE_OTHER): Payer: Self-pay | Admitting: Family Medicine

## 2024-03-26 ENCOUNTER — Encounter (INDEPENDENT_AMBULATORY_CARE_PROVIDER_SITE_OTHER): Payer: Self-pay | Admitting: Family Medicine

## 2024-03-26 ENCOUNTER — Ambulatory Visit (INDEPENDENT_AMBULATORY_CARE_PROVIDER_SITE_OTHER): Payer: Commercial Managed Care - PPO | Admitting: Family Medicine

## 2024-03-26 VITALS — BP 113/68 | HR 72 | Temp 98.6°F | Ht 66.0 in | Wt 172.0 lb

## 2024-03-26 DIAGNOSIS — G4733 Obstructive sleep apnea (adult) (pediatric): Secondary | ICD-10-CM

## 2024-03-26 DIAGNOSIS — Z6828 Body mass index (BMI) 28.0-28.9, adult: Secondary | ICD-10-CM

## 2024-03-26 DIAGNOSIS — E669 Obesity, unspecified: Secondary | ICD-10-CM | POA: Diagnosis not present

## 2024-03-26 DIAGNOSIS — Z6827 Body mass index (BMI) 27.0-27.9, adult: Secondary | ICD-10-CM

## 2024-03-26 DIAGNOSIS — E559 Vitamin D deficiency, unspecified: Secondary | ICD-10-CM

## 2024-03-26 DIAGNOSIS — E538 Deficiency of other specified B group vitamins: Secondary | ICD-10-CM | POA: Diagnosis not present

## 2024-03-26 MED ORDER — VITAMIN D (ERGOCALCIFEROL) 1.25 MG (50000 UNIT) PO CAPS: 50000.0000 [IU] | ORAL_CAPSULE | ORAL | 0 refills | Status: DC

## 2024-03-26 NOTE — Progress Notes (Signed)
 [name], D.O.  ABFM, ABOM Specializing in Clinical Bariatric Medicine  Office located at: [address] [place], [place]  [address]   Assessment and Plan:   Medications Discontinued During This Encounter  Medication Reason   Vitamin D, Ergocalciferol, (DRISDOL) 1.25 MG (50000 UNIT) CAPS capsule Reorder   Meds ordered this encounter  Medications   Vitamin D, Ergocalciferol, (DRISDOL) 1.25 MG (50000 UNIT) CAPS capsule    Sig: Take 1 capsule (50,000 Units total) by mouth every 7 (seven) days.    Dispense:  4 capsule    Refill:  0    Labs will be obtained at the OV 2-3 weeks after her next OV.   FOR THE DISEASE OF OBESITY: BMI 28.0-28.9,adult - Current BMI 27.77 Obesity (BMI 30-39.9) - Starting BMI 30.52 Assessment & Plan: Since last office visit on 03/12/24 patient's muscle mass has increased by 1.2 lb. Fat mass has decreased by 2.8 lb. Total body water has decreased by 1.2 lb.  Counseling done on how various foods will affect these numbers and how to maximize success  Total lbs lost to date: 17 lbs Total weight loss percentage to date: -8.99%    Recommended Dietary Goals [name] is currently in the action stage of change. As such, her goal is to continue weight management plan.  She has agreed to: continue current plan - CAT 1 MP with B/L options  Behavioral Intervention We discussed the following today: increasing lean protein intake to established goals, decreasing simple carbohydrates , keeping healthy foods at home, and better snacking choices  Additional resources provided today: Handout on Examples of Low Glycemic Index and Low Calorie Fruits & Vegetables, Handout on resistance training exercises using bands or free weights, Handout on CAT 1 meal plan , Handout on CAT 1-2 breakfast options, and Handout on CAT 1-2 lunch options and Handout on ways to increase NEAT.    Evidence-based interventions for health behavior change were utilized today including the  discussion of self monitoring techniques, problem-solving barriers and SMART goal setting techniques.   Regarding patient's less desirable eating habits and patterns, we employed the technique of small changes.   Pt will specifically work on: 2 sets of 10 reps of resistance band exercising 3 days per week for next visit.    Recommended Physical Activity Goals [name] has been advised to work up to 150 minutes of moderate intensity aerobic activity a week and strengthening exercises 2-3 times per week for cardiovascular health, weight loss maintenance and preservation of muscle mass.   She has agreed to :  Think about enjoyable ways to increase daily physical activity and overcoming barriers to exercise, Increase physical activity in their day and reduce sedentary time (increase NEAT)., and do resistance training with 2 sets of 10 reps using a resistance band 3 days per week and look up YouTube videos of band exercises.    Pharmacotherapy We both agreed to: continue with nutritional and behavioral strategies   FOR ASSOCIATED CONDITIONS ADDRESSED TODAY: BMI 28.0-28.9,adult - Current BMI 27.77 Obesity (BMI 30-39.9) - Starting BMI 30.52 OSA (obstructive sleep apnea) Assessment & Plan: Pt is expected to receive her CPAP machine on 4/21. Pt was prescribed Zepbound on 3/20, but pt reports it was not covered by insurance. She has a follow up with cardiology on 05/07/24 and states she plans to inquire about other possible options.   Encouraged pt to continue efforts towards weight loss by following her meal plan, prioritizing protein intake, and regularly exercising.  Pt agreed to this plan. Advised pt to continue following up with her sleep specialist as directed by them. Will monitor condition alongside PCP/specialists.    Vitamin B12 insufficeny- new onset Assessment & Plan: Lab Results  Component Value Date   VITAMINB12 411 01/20/2024   Pt is compliant with B12 supplementation 300-500 mcg.  Tolerating well with no adverse SE. No acute concerns in this regard today.   Reviewed ideal B12 levels of 500 or more with patient. Continue with current supplementation regimen. Will continue to monitor condition as it relates to her weight loss journey.    Vitamin D deficiency - new onset Assessment & Plan: Lab Results  Component Value Date   VD25OH 33.4 01/20/2024   VD25OH 37.16 04/04/2020   VD25OH 28.91 (L) 01/26/2020   Pt is compliant with ERGO 50K units once weekly. Tolerating well with no adverse SE reported. No acute concerns reported today.   Continue with current supplementation regimen. Will refill ERGO today with no dose changes. Will continue monitoring condition as it relates to her weight loss.   Orders: - Refill ERGO, no dose changes  Follow up:   Return in about 20 days (around 04/15/2024) for 3 week follow up and schedule another appt afterwards for 2-3 week f/u AND fasting labs. . She was informed of the importance of frequent follow up visits to maximize her success with intensive lifestyle modifications for her multiple health conditions.  Subjective:   Chief complaint: Obesity [name] is here to discuss her progress with her obesity treatment plan. She is on the Category 1 Plan with B/L options and states she is following her eating plan approximately 75% of the time. She states she is not exercising.  Interval History:  [name] is here for a follow up office visit. Since last OV on 03/12/24,  she has lost 2 lbs. She has overall been following her meal plan. Spent the last 2 weeks visiting her mom. Has been trying to stay well hydrated. While riding the train, she focused on high protein foods such as omelettes with cheese. Has been working on increasing NEAT by parking further away in parking lots to help her walk more.   Pharmacotherapy for weight loss: She is currently taking no anti-obesity medication.    Review of Systems:  Pertinent  positives were addressed with patient today.  Reviewed by clinician on day of visit: allergies, medications, problem list, medical history, surgical history, family history, social history, and previous encounter notes.  Weight Summary and Biometrics   Weight Lost Since Last Visit: 2 lb  Weight Gained Since Last Visit: 0   Vitals Temp: 98.6 F (37 C) BP: 113/68 Pulse Rate: 72 SpO2: 99 %   Anthropometric Measurements Height: 5\' 6"  (1.676 m) Weight: 172 lb (78 kg) BMI (Calculated): 27.77 Weight at Last Visit: 174 lb Weight Lost Since Last Visit: 2 lb Weight Gained Since Last Visit: 0 Starting Weight: 189 lb Total Weight Loss (lbs): 17 lb (7.711 kg) Peak Weight: 250 lb   Body Composition  Body Fat %: 38.2 % Fat Mass (lbs): 66 lbs Muscle Mass (lbs): 101.2 lbs Total Body Water (lbs): 69.8 lbs Visceral Fat Rating : 9   Other Clinical Data Fasting: no Labs: no Today's Visit #: 5 Starting Date: 01/20/24    Objective:   PHYSICAL EXAM: Blood pressure 113/68, pulse 72, temperature 98.6 F (37 C), height 5\' 6"  (1.676 m), weight 172 lb (78 kg), last menstrual period 12/24/2006, SpO2 99%. Body mass  index is 27.76 kg/m.  General: she is overweight, cooperative and in no acute distress. PSYCH: Has normal mood, affect and thought process.   HEENT: EOMI, sclerae are anicteric. Lungs: Normal breathing effort, no conversational dyspnea. Extremities: Moves * 4 Neurologic: A and O * 3, good insight  DIAGNOSTIC DATA REVIEWED: BMET    Component Value Date/Time   NA 143 01/20/2024 0953   K 4.5 01/20/2024 0953   CL 105 01/20/2024 0953   CO2 24 01/20/2024 0953   GLUCOSE 97 01/20/2024 0953   GLUCOSE 95 09/04/2023 0837   BUN 13 01/20/2024 0953   CREATININE 0.72 01/20/2024 0953   CALCIUM 9.9 01/20/2024 0953   GFRNONAA >60 06/08/2022 2009   GFRAA >60 11/15/2017 2142   Lab Results  Component Value Date   HGBA1C 5.5 01/20/2024   HGBA1C 5.6 10/17/2015   Lab Results   Component Value Date   INSULIN 11.2 01/20/2024   Lab Results  Component Value Date   TSH 2.190 01/20/2024   CBC    Component Value Date/Time   WBC 4.7 01/20/2024 0953   WBC 4.9 09/04/2023 0837   RBC 4.87 01/20/2024 0953   RBC 4.63 09/04/2023 0837   HGB 14.3 01/20/2024 0953   HCT 42.3 01/20/2024 0953   PLT 253 01/20/2024 0953   MCV 87 01/20/2024 0953   MCH 29.4 01/20/2024 0953   MCH 28.4 06/08/2022 2009   MCHC 33.8 01/20/2024 0953   MCHC 32.6 09/04/2023 0837   RDW 12.8 01/20/2024 0953   Iron Studies    Component Value Date/Time   IRON 89 11/14/2022 1321   TIBC 326.2 11/14/2022 1321   FERRITIN 87.8 11/14/2022 1321   IRONPCTSAT 27.3 11/14/2022 1321   Lipid Panel     Component Value Date/Time   CHOL 213 (H) 01/20/2024 0953   TRIG 100 01/20/2024 0953   HDL 54 01/20/2024 0953   CHOLHDL 3 09/04/2023 0837   VLDL 17.0 09/04/2023 0837   LDLCALC 141 (H) 01/20/2024 0953   LDLDIRECT 118.0 12/28/2019 1323   Hepatic Function Panel     Component Value Date/Time   PROT 7.1 01/20/2024 0953   ALBUMIN 4.5 01/20/2024 0953   AST 15 01/20/2024 0953   ALT 13 01/20/2024 0953   ALKPHOS 101 01/20/2024 0953   BILITOT 0.3 01/20/2024 0953   BILIDIR 0.1 09/04/2023 0837      Component Value Date/Time   TSH 2.190 01/20/2024 0953   Nutritional Lab Results  Component Value Date   VD25OH 33.4 01/20/2024   VD25OH 37.16 04/04/2020   VD25OH 28.91 (L) 01/26/2020    Attestations:   Burnett Sheng, acting as a medical scribe for Thomasene Lot, DO., have compiled all relevant documentation for today's office visit on behalf of Thomasene Lot, DO, while in the presence of [employer], DO.  Reviewed by clinician on day of visit: allergies, medications, problem list, medical history, surgical history, family history, social history, and previous encounter notes pertinent to patient's obesity diagnosis.  I have reviewed the above documentation for accuracy and completeness, and I  agree with the above. [name], D.O.  The 21st Century Cures Act was signed into law in 2016 which includes the topic of electronic health records.  This provides immediate access to information in MyChart.  This includes consultation notes, operative notes, office notes, lab results and pathology reports.  If you have any questions about what you read please let us know at your next visit so we can discuss your concerns and  take corrective action if need be.  We are right here with you.

## 2024-03-27 ENCOUNTER — Ambulatory Visit: Payer: Commercial Managed Care - PPO | Attending: Cardiology | Admitting: Cardiology

## 2024-03-27 ENCOUNTER — Other Ambulatory Visit (INDEPENDENT_AMBULATORY_CARE_PROVIDER_SITE_OTHER): Payer: Self-pay | Admitting: Family Medicine

## 2024-03-27 ENCOUNTER — Encounter: Payer: Self-pay | Admitting: Cardiology

## 2024-03-27 VITALS — BP 110/80 | HR 75 | Ht 66.0 in | Wt 177.6 lb

## 2024-03-27 DIAGNOSIS — R0600 Dyspnea, unspecified: Secondary | ICD-10-CM | POA: Diagnosis not present

## 2024-03-27 DIAGNOSIS — R931 Abnormal findings on diagnostic imaging of heart and coronary circulation: Secondary | ICD-10-CM

## 2024-03-27 DIAGNOSIS — E785 Hyperlipidemia, unspecified: Secondary | ICD-10-CM

## 2024-03-27 DIAGNOSIS — E559 Vitamin D deficiency, unspecified: Secondary | ICD-10-CM

## 2024-03-27 DIAGNOSIS — R072 Precordial pain: Secondary | ICD-10-CM

## 2024-03-27 DIAGNOSIS — R0609 Other forms of dyspnea: Secondary | ICD-10-CM

## 2024-03-27 MED ORDER — ATORVASTATIN CALCIUM 40 MG PO TABS: 40.0000 mg | ORAL_TABLET | Freq: Every day | ORAL | 3 refills | Status: DC

## 2024-03-27 MED ORDER — NITROGLYCERIN 0.4 MG SL SUBL: 0.4000 mg | SUBLINGUAL_TABLET | SUBLINGUAL | 3 refills | Status: AC | PRN

## 2024-03-27 NOTE — Patient Instructions (Signed)
 Medication Instructions:  Increase:  Atorvastatin (Lipitor) to 40 mg once daily  Start:  Nitroglycerin (Nitrostat), 0.4 mg, one tablet, under the tongue every five minutes as needed for Chest Pain; Max of 3 doses in one 24 hour period, please call 911 or have someone take you to the ER if you have to take a third dose.  Lab Work: None  Testing/Procedures: Your physician has requested that you have an echocardiogram. Echocardiography is a painless test that uses sound waves to create images of your heart. It provides your doctor with information about the size and shape of your heart and how well your heart's chambers and valves are working. This procedure takes approximately one hour. There are no restrictions for this procedure. Please do NOT wear cologne, perfume, aftershave, or lotions (deodorant is allowed). Please arrive 15 minutes prior to your appointment time.       Please report to Radiology at the [facility] Main Entrance 30 minutes early for your test.  [address] [place], [place] [address]                         OR   Please report to Radiology at [facility] Main Entrance, medical mall, 30 mins prior to your test.  [address]  [place], [place]  How to Prepare for Your Cardiac PET/CT Stress Test:  Nothing to eat or drink, except water, 3 hours prior to arrival time.  NO caffeine/decaffeinated products, or chocolate 12 hours prior to arrival. (Please note decaffeinated beverages (teas/coffees) still contain caffeine).  If you have caffeine within 12 hours prior, the test will need to be rescheduled.  Medication instructions: Do not take nitrates (isosorbide mononitrate, Ranexa) the day before or day of test Do not take tamsulosin the day before or morning of test Hold theophylline containing medications for 12 hours. Hold Dipyridamole 48 hours prior to the test.  Diabetic Preparation: If able to eat breakfast prior to 3  hour fasting, you may take all medications, including your insulin. Do not worry if you miss your breakfast dose of insulin - start at your next meal. If you do not eat prior to 3 hour fast-Hold all diabetes (oral and insulin) medications. Patients who wear a continuous glucose monitor MUST remove the device prior to scanning.  You may take your remaining medications with water.  NO perfume, cologne or lotion on chest or abdomen area. FEMALES - Please avoid wearing dresses to this appointment.  Total time is 1 to 2 hours; you may want to bring reading material for the waiting time.  IF YOU THINK YOU MAY BE PREGNANT, OR ARE NURSING PLEASE INFORM THE TECHNOLOGIST.  In preparation for your appointment, medication and supplies will be purchased.  Appointment availability is limited, so if you need to cancel or reschedule, please call the Radiology Department Scheduler at [phone] 24 hours in advance to avoid a cancellation fee of $100.00  What to Expect When you Arrive:  Once you arrive and check in for your appointment, you will be taken to a preparation room within the Radiology Department.  A technologist or Nurse will obtain your medical history, verify that you are correctly prepped for the exam, and explain the procedure.  Afterwards, an IV will be started in your arm and electrodes will be placed on your skin for EKG monitoring during the stress portion of the exam. Then you will be escorted to the PET/CT  scanner.  There, staff will get you positioned on the scanner and obtain a blood pressure and EKG.  During the exam, you will continue to be connected to the EKG and blood pressure machines.  A small, safe amount of a radioactive tracer will be injected in your IV to obtain a series of pictures of your heart along with an injection of a stress agent.    After your Exam:  It is recommended that you eat a meal and drink a caffeinated beverage to counter act any effects of the stress  agent.  Drink plenty of fluids for the remainder of the day and urinate frequently for the first couple of hours after the exam.  Your doctor will inform you of your test results within 7-10 business days.  For more information and frequently asked questions, please visit our website: [email or web address]  For questions about your test or how to prepare for your test, please call: Cardiac Imaging Nurse Navigators Office: [phone]    Follow-Up: At [facility], you and your health needs are our priority.  As part of our continuing mission to provide you with exceptional heart care, our providers are all part of one team.  This team includes your primary Cardiologist (physician) and Advanced Practice Providers or APPs (Physician Assistants and Nurse Practitioners) who all work together to provide you with the care you need, when you need it.  Your next appointment:   3 month(s)  Provider:   Little Ishikawa, MD        Valet parking services will be available as well.

## 2024-03-28 ENCOUNTER — Encounter: Payer: Self-pay | Admitting: Cardiology

## 2024-03-30 ENCOUNTER — Encounter: Payer: Self-pay | Admitting: *Deleted

## 2024-03-31 ENCOUNTER — Other Ambulatory Visit: Payer: Self-pay | Admitting: Cardiology

## 2024-03-31 ENCOUNTER — Other Ambulatory Visit: Payer: Self-pay | Admitting: *Deleted

## 2024-03-31 DIAGNOSIS — I2510 Atherosclerotic heart disease of native coronary artery without angina pectoris: Secondary | ICD-10-CM

## 2024-03-31 DIAGNOSIS — Z6828 Body mass index (BMI) 28.0-28.9, adult: Secondary | ICD-10-CM

## 2024-03-31 DIAGNOSIS — G4733 Obstructive sleep apnea (adult) (pediatric): Secondary | ICD-10-CM

## 2024-03-31 MED ORDER — TIRZEPATIDE-WEIGHT MANAGEMENT 2.5 MG/0.5ML ~~LOC~~ SOLN
2.5000 mg | SUBCUTANEOUS | 3 refills | Status: DC
Start: 2024-03-31 — End: 2024-04-01

## 2024-03-31 NOTE — Progress Notes (Signed)
 Called and made patient aware that medication for Zepbound had been sent to her pharmacy of choice. Patient verbalized an understanding.

## 2024-03-31 NOTE — Telephone Encounter (Signed)
 Yes we could try refilling those Zepbound and include the diagnosis of CAD to see if it is approved by insurance

## 2024-03-31 NOTE — Telephone Encounter (Signed)
 Yes okay to continue Adderall.  For Zepbound, she does have a diagnosis of CAD now so could see if it is approved with this diagnosis

## 2024-04-01 ENCOUNTER — Other Ambulatory Visit: Payer: Self-pay | Admitting: Cardiology

## 2024-04-01 ENCOUNTER — Encounter: Payer: Self-pay | Admitting: Pharmacist

## 2024-04-01 ENCOUNTER — Other Ambulatory Visit (HOSPITAL_COMMUNITY): Payer: Self-pay

## 2024-04-01 ENCOUNTER — Telehealth: Payer: Self-pay

## 2024-04-01 ENCOUNTER — Other Ambulatory Visit: Payer: Self-pay | Admitting: *Deleted

## 2024-04-01 DIAGNOSIS — Z6828 Body mass index (BMI) 28.0-28.9, adult: Secondary | ICD-10-CM

## 2024-04-01 DIAGNOSIS — L719 Rosacea, unspecified: Secondary | ICD-10-CM | POA: Insufficient documentation

## 2024-04-01 DIAGNOSIS — G4733 Obstructive sleep apnea (adult) (pediatric): Secondary | ICD-10-CM

## 2024-04-01 DIAGNOSIS — I2510 Atherosclerotic heart disease of native coronary artery without angina pectoris: Secondary | ICD-10-CM

## 2024-04-01 DIAGNOSIS — B353 Tinea pedis: Secondary | ICD-10-CM | POA: Insufficient documentation

## 2024-04-01 DIAGNOSIS — L649 Androgenic alopecia, unspecified: Secondary | ICD-10-CM | POA: Insufficient documentation

## 2024-04-01 MED ORDER — TIRZEPATIDE-WEIGHT MANAGEMENT 2.5 MG/0.5ML ~~LOC~~ SOLN
2.5000 mg | SUBCUTANEOUS | 3 refills | Status: DC
Start: 2024-04-01 — End: 2024-05-21

## 2024-04-01 NOTE — Progress Notes (Unsigned)
 Called and spoke to patient and made her aware per Dr. Bjorn Pippin of to take Adderall  Zepbound ordered as requested and diagnosis attached and sent to patient's pharmacy of [facility], [place], [place]. Made patient aware to call office for any other questions. Understanding verbalized.

## 2024-04-01 NOTE — Telephone Encounter (Signed)
 Please see separate encounter.

## 2024-04-01 NOTE — Telephone Encounter (Signed)
 Please add obstructive sleep apnea to PA

## 2024-04-01 NOTE — Telephone Encounter (Signed)
 PA request requires appeal. For additional info see Pharmacy Prior Auth telephone encounter from 04/01/24.

## 2024-04-01 NOTE — Telephone Encounter (Signed)
 Pharmacy Patient Advocate Encounter   Received notification from Physician's Office that prior authorization for ZEPBOUND is required/requested.   Insurance verification completed.   The patient is insured through [facility] .   Per test claim: Drug not covered, plan/benefit exclusion. Attempted to submit PA, but due to recent denial on file, appeal is required.

## 2024-04-02 NOTE — Telephone Encounter (Signed)
 Please see separate encounter.

## 2024-04-02 NOTE — Telephone Encounter (Signed)
 Pharmacy has an RX question on this Pt of Dr. Bjorn Pippin. Please address.

## 2024-04-03 NOTE — Telephone Encounter (Signed)
 No we don't keep the denial copies

## 2024-04-03 NOTE — Telephone Encounter (Signed)
 Brittany Sine, do you still have the PA denial letter?

## 2024-04-03 NOTE — Telephone Encounter (Signed)
 Since our team didn't submit the original PA, we don't have a copy of the previous denial on file anywhere with appeal instructions. The office that submitted the PA and got the original denial should have documentation explaining next steps for an appeal.

## 2024-04-03 NOTE — Telephone Encounter (Signed)
 Per chart review PA initially submitted by Fam Med/PCP office  [facility]WD for assistance with appeal

## 2024-04-06 ENCOUNTER — Other Ambulatory Visit: Payer: Self-pay | Admitting: Cardiology

## 2024-04-06 ENCOUNTER — Ambulatory Visit: Admitting: Podiatry

## 2024-04-06 DIAGNOSIS — Z6828 Body mass index (BMI) 28.0-28.9, adult: Secondary | ICD-10-CM

## 2024-04-06 DIAGNOSIS — I2510 Atherosclerotic heart disease of native coronary artery without angina pectoris: Secondary | ICD-10-CM

## 2024-04-06 DIAGNOSIS — G4733 Obstructive sleep apnea (adult) (pediatric): Secondary | ICD-10-CM

## 2024-04-06 NOTE — Telephone Encounter (Signed)
 Noted.

## 2024-04-06 NOTE — Telephone Encounter (Signed)
 Phentermine is not a medication that we would prescribe, would recommend discussing with PCP

## 2024-04-06 NOTE — Telephone Encounter (Signed)
 Spoke to patient she stated she no longer takes Phentermine.Stated she wanted to know if Zepbound approved.Advised I will send message to PA team.

## 2024-04-08 ENCOUNTER — Other Ambulatory Visit: Payer: Self-pay | Admitting: Cardiology

## 2024-04-08 ENCOUNTER — Other Ambulatory Visit: Payer: Self-pay | Admitting: Family Medicine

## 2024-04-08 DIAGNOSIS — G4733 Obstructive sleep apnea (adult) (pediatric): Secondary | ICD-10-CM

## 2024-04-08 DIAGNOSIS — Z6828 Body mass index (BMI) 28.0-28.9, adult: Secondary | ICD-10-CM

## 2024-04-08 DIAGNOSIS — I2510 Atherosclerotic heart disease of native coronary artery without angina pectoris: Secondary | ICD-10-CM

## 2024-04-09 ENCOUNTER — Encounter: Payer: Self-pay | Admitting: *Deleted

## 2024-04-13 ENCOUNTER — Telehealth (INDEPENDENT_AMBULATORY_CARE_PROVIDER_SITE_OTHER): Admitting: Psychology

## 2024-04-13 DIAGNOSIS — F5089 Other specified eating disorder: Secondary | ICD-10-CM | POA: Diagnosis not present

## 2024-04-13 DIAGNOSIS — F419 Anxiety disorder, unspecified: Secondary | ICD-10-CM | POA: Diagnosis not present

## 2024-04-13 NOTE — Progress Notes (Signed)
 Office: [phone]  /  Fax: [phone]    Date: April 13, 2024    Appointment Start Time: 12:00pm Duration: 39 minutes Provider: Catherene [name], Psy.D. Type of Session: Intake for Individual Therapy  Location of Patient: Home (private location) Location of Provider: Provider's home (private office) Type of Contact: Telepsychological Visit via MyChart Video Visit  Informed Consent: Prior to proceeding with today's appointment, two pieces of identifying information were obtained. In addition, [name]'s physical location at the time of this appointment was obtained as well a phone number she could be reached at in the event of technical difficulties. [name] and this provider participated in today's telepsychological service.   The provider's role was explained to [name]. The provider reviewed and discussed issues of confidentiality, privacy, and limits therein (e.g., reporting obligations). In addition to verbal informed consent, written informed consent for psychological services was obtained prior to the initial appointment. Since the clinic is not a 24/7 crisis center, mental health emergency resources were shared and this  provider explained MyChart, e-mail, voicemail, and/or other messaging systems should be utilized only for non-emergency reasons. This provider also explained that information obtained during appointments will be placed in [name]'s medical record and relevant information will be shared with other providers at [facility] at any locations for coordination of care. [name] agreed information may be shared with other [facility] providers as needed for coordination of care and by signing the service agreement document, she provided written consent for coordination of care. Prior to initiating telepsychological services, [name] completed an informed consent document, which included the development of a safety plan (i.e., an emergency contact  and emergency resources) in the event of an emergency/crisis. [name] verbally acknowledged understanding she is ultimately responsible for understanding her insurance benefits for telepsychological and in-person services. This provider also reviewed confidentiality, as it relates to telepsychological services. [name]  acknowledged understanding that appointments cannot be recorded without both party consent and she is aware she is responsible for securing confidentiality on her end of the session. [name] verbally consented to proceed.  Chief Complaint/HPI: [name] was referred by [name] on 01/20/2024. Per the note for the OV, "Patient is agreeable to being referred to [name], our Bariatric Psychologist, for evaluation due to her elevated PHQ-9 score and significant struggles with emotional eating.Reminded patient of the importance of following their prudent nutrition plan and how food can affect mood as well to support emotional wellbeing. Continue to follow up with her behavioral health specialist as instructed by them."  During today's appointment, [name] was verbally administered a questionnaire assessing various behaviors related to emotional eating behaviors. [name] endorsed the following: overeat when you are celebrating, experience food cravings on a regular basis, eat certain foods when you are anxious, stressed, depressed, or your feelings are hurt, use food to help you cope with emotional situations, find food is comforting to you, overeat when you are angry or upset, overeat when you are worried about something, not worry about what you eat when you are in a good mood, overeat when you are alone, but eat much less when you are with other people, and eat as a reward. She shared she craves sweets (e.g., cupcakes, cake, cookies). Amit believes the onset of emotional eating behaviors was likely in childhood. She described the frequency of emotional eating behaviors as "daily" prior to  starting with the clinic. In addition, [name] denied a history of binge eating behaviors. [name] denied a history of  significantly restricting food intake, purging and engagement in other compensatory strategies for weight loss, and has never been diagnosed with an eating disorder. She also denied a history of treatment for emotional eating behaviors. Currently, [name] indicated she is prescribed a structured meal plan (Cat 1). Furthermore, [name] disclosed, "I do have anxiety and I'm trying to control my anger." She also shared her father passed away in October 10, 2023.   Mental Status Examination:  Appearance: neat Behavior: appropriate to circumstances Mood: anxious Affect: mood congruent Speech: WNL Eye Contact: appropriate Psychomotor Activity: WNL Gait: unable to assess  Thought Process: linear, logical, and goal directed and denies suicidal, homicidal, and self-harm ideation, plan and intent  Thought Content/Perception: no hallucinations, delusions, bizarre thinking or behavior endorsed or observed Orientation: AAOx4 Memory/Concentration: intact Insight/Judgment: fair  Family & Psychosocial History: [name] reported she is married and she has two adult children. She indicated she is currently employed in [employer] and also owns a food truck. Additionally, [name] shared her highest level of education obtained is a high school diploma. Currently, [name] social support system consists of her husband, children, and mother. Moreover, [name] stated she resides with her husband.   Medical History:  Past Medical History:  Diagnosis Date   ADHD    Allergy    Anxiety    Back pain    CAD (coronary artery disease)    Constipated    Depression    Fatty liver    GERD (gastroesophageal reflux disease)    History of abnormal cervical Pap smear    1991 -- 1995--hx cryotherapy to cervix   History of colon polyps    2008- BENIGN   Hydronephrosis, left    Kidney stones 2016   OSA (obstructive  sleep apnea)    PONV (postoperative nausea and vomiting)    RLS (restless legs syndrome)    Rosacea    Vitamin D  deficiency    Past Surgical History:  Procedure Laterality Date   ABDOMINAL HYSTERECTOMY     ANTERIOR AND POSTERIOR REPAIR WITH SACROSPINOUS FIXATION N/A 08/16/2015   Procedure: ANTERIOR COLPORRHAPHY WITH XENOFORM GRAFT AND SACROSPINOUS FIXATION;  Surgeon: Greta Leatherwood, MD;  Location: [facility];  Service: Gynecology;  Laterality: N/A;  2.5 hours OR time   BLADDER SUSPENSION N/A 08/16/2015   Procedure: TRANSVAGINAL TAPE (TVT) PROCEDURE exact midurethral sling;  Surgeon: Greta Leatherwood, MD;  Location: [facility];  Service: Gynecology;  Laterality: N/A;   CHOLECYSTECTOMY  1998   COLONOSCOPY  08/21/2017   per Dr. Howard Macho, clear, repeat in 10 yrs   CYSTOCELE REPAIR N/A 12/12/2022   Procedure: ANTERIOR REPAIR (CYSTOCELE);  Surgeon: Arma Lamp, MD;  [facility]: [facility];  Service: Gynecology;  Laterality: N/A;   CYSTOSCOPY N/A 08/16/2015   Procedure: CYSTOSCOPY;  Surgeon: Greta Leatherwood, MD;  Location: [facility];  Service: Gynecology;  Laterality: N/A;   CYSTOSCOPY N/A 12/12/2022   Procedure: CYSTOSCOPY;  Surgeon: Arma Lamp, MD;  Location: [facility];  Service: Gynecology;  Laterality: N/A;   CYSTOSCOPY W/ RETROGRADES Bilateral 06/22/2015   Procedure: CYSTOSCOPY WITH RETROGRADE PYELOGRAM;  Surgeon: Marco Severs, MD;  Location: [facility];  Service: Urology;  Laterality: Bilateral;   CYSTOSCOPY W/ URETERAL STENT PLACEMENT  01/1999   CYSTOSCOPY WITH HOLMIUM LASER LITHOTRIPSY Left 05/18/2015   Procedure: CYSTOSCOPY WITH HOLMIUM LASER LITHOTRIPSY;  Surgeon: Osborn Blaze, MD;  Location: [facility];  Service: Urology;  Laterality: Left;  CYSTOSCOPY WITH RETROGRADE PYELOGRAM, URETEROSCOPY AND STENT PLACEMENT Left 06/22/2015   Procedure: CYSTOSCOPY,  LEFT URETEROSCOPY;   Surgeon: Marco Severs, MD;  Location: [facility];  Service: Urology;  Laterality: Left;   CYSTOSCOPY WITH URETEROSCOPY AND STENT PLACEMENT Left 05/18/2015   Procedure: CYSTOSCOPY WITH URETEROSCOPY, STONE MANIPULATION AND STENT PLACEMENT;  Surgeon: Osborn Blaze, MD;  Location: [facility];  Service: Urology;  Laterality: Left;   DIAGNOSTIC LAPAROSCOPY     DX LAPAROSCOPY  X2   ESOPHAGOGASTRODUODENOSCOPY  05/09/2007   EXPLORATORY LAPAROTOMY W/ BILATERAL SALPINGECTOMY AND REMOVAL RIGHT ECTOPIC PREG.  02/23/2004   EXTRACORPOREAL SHOCK WAVE LITHOTRIPSY  2001  &  2002   LAPAROSCOPIC CHOLECYSTECTOMY  1999   POLYPECTOMY     ROBOTIC ASSISTED LAPAROSCOPIC SACROCOLPOPEXY N/A 12/12/2022   Procedure: XI ROBOTIC ASSISTED LAPAROSCOPIC SACROCOLPOPEXY;  Surgeon: Arma Lamp, MD;  Location: [facility];  Service: Gynecology;  Laterality: N/A;  Total time requested 3 hours -Assist requested   SHOULDER ARTHROSCOPY WITH OPEN ROTATOR CUFF REPAIR Right 2012   TOTAL ABDOMINAL HYSTERECTOMY W/ BILATERAL OOPHORECTOMY AND LYSIS ADHESIONS  09/02/2007   TUBAL LIGATION Bilateral 1995   UMBILICAL HERNIA REPAIR  2003   unsure if she has mesh   VAGINAL HYSTERECTOMY N/A 08/18/2015   Procedure: Exam under Anesthesia, Excision Xenform Graft, Removal Bilateral Sacrospinous Ligament sutures;  Surgeon: Greta Leatherwood, MD;  Location: [facility];  Service: Gynecology;  Laterality: N/A;   Current Outpatient Medications on File Prior to Visit  Medication Sig Dispense Refill   ALPRAZolam  (XANAX ) 1 MG tablet Take 1 tablet (1 mg total) by mouth 2 (two) times daily as needed for anxiety. 60 tablet 5   amphetamine -dextroamphetamine  (ADDERALL XR) 20 MG 24 hr capsule Take 1 capsule (20 mg total) by mouth every morning. 30 capsule 0   aspirin  EC 81 MG tablet Take 1 tablet (81 mg total) by mouth daily. Swallow whole. 1 tablet 0   atorvastatin  (LIPITOR) 40 MG tablet Take 1  tablet (40 mg total) by mouth daily. 90 tablet 3   cyanocobalamin  (VITAMIN B12) 500 MCG tablet 300 mcg- 500 mcg daily     estradiol  (ESTRACE ) 0.1 MG/GM vaginal cream Place 0.5g nightly for two weeks then twice a week after 30 g 11   nitroGLYCERIN  (NITROSTAT ) 0.4 MG SL tablet Place 1 tablet (0.4 mg total) under the tongue every 5 (five) minutes as needed for chest pain. 3 doses max in a 24 hour period 25 tablet 3   tirzepatide (ZEPBOUND) 2.5 MG/0.5ML injection vial Inject 2.5 mg into the skin once a week. 2 mL 3   Vitamin D , Ergocalciferol , (DRISDOL ) 1.25 MG (50000 UNIT) CAPS capsule Take 1 capsule (50,000 Units total) by mouth every 7 (seven) days. 4 capsule 0   No current facility-administered medications on file prior to visit.  Kameron stated she is medication compliant.   Mental Health History: [name] reported she currently meets with [name] with [facility]. [name] agreed to sign an authorization for coordination of care if deemed necessary. She stated she is prescribed Xanax  PRN and Adderall to help her focus by her PCP. [name] reported there is no history of hospitalizations for psychiatric concerns. She endorsed a family history of anxiety (sister and brother), substance abuse (sister), and depression (brother). Furthermore, Yanna reported a belief she has "suppressed a lot of stuff" related to a distant family member and childhood. She also recalled being left at the mall by her aunts  and they "did weird stuff in the closet." No further details recalled/shared. She denied current safety concerns.  Terianne reported a history of suicidal ideation ("I don't want to deal with this anymore"), noting the last time was approximately two years ago. She denied ever experiencing suicidal plan or intent. The following protective factors were identified for [name]: future, children, husband, and grandchildren. If she were to become overwhelmed in the future, which is a sign that a  crisis may occur, she identified the following coping skills she could engage in: bring self to present moment, focus on what can be controlled, and take a bath. It was recommended the aforementioned be written down and developed into a coping card for future reference. Psychoeducation regarding the importance of reaching out to a trusted individual and/or utilizing emergency resources if there is a change in emotional status and/or there is an inability to ensure safety was provided. [name]'s confidence in reaching out to a trusted individual and/or utilizing emergency resources should there be an intensification in emotional status and/or there is an inability to ensure safety was assessed on a scale of one to ten where one is not confident and ten is extremely confident. She reported her confidence is a 10.  [name] described her typical mood lately as "overwhelmed," adding she always sees the "glass half empty." She further described experiencing what if scenarios and constantly planning ahead. She also reported a history of experiencing panic attacks, noting the frequency as every few months. [name] also noted experiencing crying spells secondary to grief. Maesyn endorsed very infrequent alcohol use. She denied current tobacco use. She endorsed illicit/recreational substance use. Furthermore, [name] indicated she is not experiencing the following: hallucinations and delusions, paranoia, symptoms of mania , social withdrawal, symptoms of trauma, memory concerns, attention and concentration issues, and obsessions and compulsions. She also denied current suicidal ideation, plan, and intent; history of and current homicidal ideation, plan, and intent; and history of and current engagement in self-harm.  Legal History: Brileigh reported there is no history of legal involvement.   Structured Assessments Results: The Patient Health Questionnaire-9 (PHQ-9) is a self-report measure that assesses symptoms and severity  of depression over the course of the last two weeks. Juley obtained a score of 8 suggesting mild depression. Sherae finds the endorsed symptoms to be somewhat difficult. [0= Not at all; 1= Several days; 2= More than half the days; 3= Nearly every day] Little interest or pleasure in doing things 0  Feeling down, depressed, or hopeless 0  Trouble falling or staying asleep, or sleeping too much 2  Feeling tired or having little energy 3  Poor appetite or overeating 1  Feeling bad about yourself --- or that you are a failure or have let yourself or your family down 1  Trouble concentrating on things, such as reading the newspaper or watching television 1  Moving or speaking so slowly that other people could have noticed? Or the opposite --- being so fidgety or restless that you have been moving around a lot more than usual 0  Thoughts that you would be better off dead or hurting yourself in some way 0  PHQ-9 Score 8    The Generalized Anxiety Disorder-7 (GAD-7) is a brief self-report measure that assesses symptoms of anxiety over the course of the last two weeks. Anjanae obtained a score of 5 suggesting mild anxiety. Kaysey finds the endorsed symptoms to be not difficult at all. [0= Not at all; 1= Several days; 2= Over half  the days; 3= Nearly every day] Feeling nervous, anxious, on edge 1  Not being able to stop or control worrying 0  Worrying too much about different things 1  Trouble relaxing 1  Being so restless that it's hard to sit still 0  Becoming easily annoyed or irritable 1  Feeling afraid as if something awful might happen 1  GAD-7 Score 5   Interventions:  Conducted a chart review Focused on rapport building Verbally administered PHQ-9 and GAD-7 for symptom monitoring Verbally administered Food & Mood questionnaire to assess various behaviors related to emotional eating Provided emphatic reflections and validation Psychoeducation provided regarding physical versus emotional  hunger  Conducted a risk assessment  Diagnostic Impressions & Provisional DSM-5 Diagnosis(es): Braylin endorsed a history of engagement in emotional eating behaviors and noted the onset in childhood. She described the frequency as daily prior to starting with the clinic. Dela denied engagement in any other disordered eating behaviors. Based on the aforementioned, the following diagnosis(es) were assigned: F50.89 Other Specified Feeding or Eating Disorder, Emotional Eating Behaviors. She also reported experiencing anxiety and panic attacks as well as attending therapeutic services and taking Xanax  PRN to address the aforementioned. Given the limited scope of this appointment and this provider's role with the clinic, the following diagnosis(es) were assigned: F41.9 Unspecified Anxiety Disorder.  Plan: Yoshino appears able and willing to participate as evidenced by engagement in reciprocal conversation and asking questions as needed for clarification. The next appointment is scheduled for 04/27/2024 at 2pm, which will be via MyChart Video Visit. The following treatment goal was established: increase coping skills. This provider will regularly review the treatment plan and medical chart to keep informed of status changes. Adiyah expressed understanding and agreement with the initial treatment plan of care.   Thomasa will be sent a handout via e-mail to utilize between now and the next appointment to increase awareness of hunger patterns and subsequent eating. [name] provided verbal consent during today's appointment for this provider to send the handout via e-mail. Sharniece will continue with her primary therapist.    Brittany Close, PsyD

## 2024-04-14 ENCOUNTER — Ambulatory Visit (INDEPENDENT_AMBULATORY_CARE_PROVIDER_SITE_OTHER): Admitting: Psychology

## 2024-04-14 ENCOUNTER — Telehealth: Payer: Self-pay | Admitting: Family Medicine

## 2024-04-14 DIAGNOSIS — F411 Generalized anxiety disorder: Secondary | ICD-10-CM

## 2024-04-14 DIAGNOSIS — F4321 Adjustment disorder with depressed mood: Secondary | ICD-10-CM | POA: Diagnosis not present

## 2024-04-14 MED ORDER — AMPHETAMINE-DEXTROAMPHET ER 20 MG PO CP24: 20.0000 mg | ORAL_CAPSULE | ORAL | 0 refills | Status: DC

## 2024-04-14 NOTE — Telephone Encounter (Unsigned)
 Copied from CRM [id number]. Topic: General - Other >> Apr 14, 2024 11:37 AM Clyde Darling P wrote: Reason for CRM: Pt would like a call back from nurse to go over zepbound and adderall.

## 2024-04-14 NOTE — Telephone Encounter (Signed)
 Done

## 2024-04-14 NOTE — Progress Notes (Signed)
 [facility] Counselor/Therapist Progress Note  Patient ID: [name], MRN: [id number],    Date: 04/14/24   Time Spent: 12:10pm-12:57pm  Treatment Type: Individual Therapy  Pt is seen for a virtual video visit via caregility. Pt consents to virtual visit and is aware of limitations of virtual visits.  Pt joins from her home, reporting privacy, and counselor from her home office.     Reported Symptoms: pt reports panic w/ trying to use CPAP and emotional escalation 1 time last week.    Mental Status Exam: Appearance:  Well Groomed     Behavior: Appropriate  Motor: Normal  Speech/Language:  Normal Rate  Affect: Appropriate  Mood: anxious  Thought process: normal  Thought content:   WNL  Sensory/Perceptual disturbances:   WNL  Orientation: oriented to person, place, time/date, and situation  Attention: Good  Concentration: Good  Memory: WNL  Fund of knowledge:  Good  Insight:   Good  Judgment:  Good  Impulse Control: Good   Risk Assessment: Danger to Self:  No Self-injurious Behavior: No Danger to Others: No Duty to Warn:no Physical Aggression / Violence:No  Access to Firearms a concern: No  Gang Involvement:No   Subjective: Counselor assessed pt current functioning per pt report.  Processed w/pt stressors and emotions.   Explored recent emotional escalation and recognizing triggers and areas to focus on deescalation skills in future.  Assisted w/ reframing negative self talk.  Explored w/ pt panic w/ CPAP and encouraged to advocate for different apparatus.    Pt affect wnl.  Pt reports late due to work meeting running over.  Pt reports that she was fitted for CPAP and tried using and has had a panic attack each time attempted.  Pt agrees to f/u w/ seeing other options available.  Pt reported on recent emotional escalation during conflict w/ husband.  Pt recognized triggers w/ his comments and identified areas to put skills into practice next  time.  Pt was able to reframe negative self talk.  Pt reports that her mother in law has been in [name] w/ heart function at 20% and going to visit her w/ husband. Pt was able to recognize how husband process and expressing emotions differently and not reflection of their relationship when he won't talk to her about.      Interventions: Cognitive Behavioral Therapy, Mindfulness Meditation, and supportive  Diagnosis:Generalized anxiety disorder  Grief  Plan: Pt to f/u w/ Counseling in 3 weeks.  Pt to f/u w/ PCP, Cardiologist and [facility] as scheduled.   Individualized Treatment Plan Strengths: enjoys walking and movement, enjoys the beach, family is important to her  Supports: her husband, her daughter in law and her mom    Goal/Needs for Treatment:  In order of importance to patient 1) cope with anxiety 2) increase self worth and decrease negative thought patterns.  3) continue cope through losses       Client Statement of Needs:   "talk about my grief, continue to work through my anxiety, try not be so negative on stuff and thinking things are going to go bad or wrong.  Finding time for self again."        Treatment Level: outpatient counseling  Symptoms:anxiety, worry, ruminating thoughts, low self worth, emotional escalations, depressed mood, sleep disturbance  Client Treatment Preferences: counseling every 2-4 weeks and continue medication management w/ PCP    Healthcare consumer's goal for treatment:   Counselor, [name], [facility]  will support the patient's ability to achieve the goals identified. Cognitive Behavioral Therapy, Assertive Communication/Conflict Resolution Training, Relaxation Training, ACT, Humanistic and other evidenced-based practices will be used to promote progress towards healthy functioning.    Healthcare consumer will: Actively participate in therapy, working towards healthy functioning.     *Justification for  Continuation/Discontinuation of Goal: R=Revised, O=Ongoing, A=Achieved, D=Discontinued   Goal 1) Pt will increase coping with life's anxiety and stressors AEB decreased ruminating worry, decreased emotional escalations, decreased negative thought patterns. That contribute to anxiety. Baseline date 03/10/24: Progress towards goal 40%; How Often - Daily Target Date Goal Was reviewed Status Code Progress towards goal  03/10/25                            Goal 2)  Increase self awareness, consistency w/ self care time and self compassion statements AEB pt report and therapist observation.   Baseline date 03/10/24: Progress towards goal 30; How Often - Daily Target Date Goal Was reviewed Status Code Progress towards goal/Likert rating  03/10/25                            Goal 3) Verbalize feelings of grief related to losses and recognize ways of living with grief present.  Baseline date 03/10/24: Progress towards goal 30; How Often - Daily Target Date Goal Was reviewed Status Code Progress towards goal/Likert rating  03/10/25                            This plan has been reviewed and created by the following participants:  This plan will be reviewed at least every 12 months. Date Behavioral Health Clinician Date Guardian/Patient   03/10/24 [name]                      03/10/24 Verbal consent provided and request for electronic signature sent and obtained             [name]

## 2024-04-15 ENCOUNTER — Encounter (INDEPENDENT_AMBULATORY_CARE_PROVIDER_SITE_OTHER): Payer: Self-pay | Admitting: Family Medicine

## 2024-04-15 ENCOUNTER — Ambulatory Visit (INDEPENDENT_AMBULATORY_CARE_PROVIDER_SITE_OTHER): Admitting: Family Medicine

## 2024-04-15 VITALS — BP 138/76 | HR 77 | Temp 97.7°F | Ht 66.0 in | Wt 172.0 lb

## 2024-04-15 DIAGNOSIS — E559 Vitamin D deficiency, unspecified: Secondary | ICD-10-CM

## 2024-04-15 DIAGNOSIS — G4733 Obstructive sleep apnea (adult) (pediatric): Secondary | ICD-10-CM | POA: Diagnosis not present

## 2024-04-15 DIAGNOSIS — F5089 Other specified eating disorder: Secondary | ICD-10-CM | POA: Diagnosis not present

## 2024-04-15 DIAGNOSIS — Z6827 Body mass index (BMI) 27.0-27.9, adult: Secondary | ICD-10-CM

## 2024-04-15 DIAGNOSIS — E669 Obesity, unspecified: Secondary | ICD-10-CM

## 2024-04-15 DIAGNOSIS — Z6828 Body mass index (BMI) 28.0-28.9, adult: Secondary | ICD-10-CM

## 2024-04-15 NOTE — Progress Notes (Signed)
 [name], D.O.  ABFM, ABOM Specializing in Clinical Bariatric Medicine  Office located at: [address] [place], [place]  [address]   Assessment and Plan:  No orders of the defined types were placed in this encounter.  Medications Discontinued During This Encounter  Medication Reason   amphetamine -dextroamphetamine  (ADDERALL XR) 20 MG 24 hr capsule    amphetamine -dextroamphetamine  (ADDERALL XR) 20 MG 24 hr capsule     No orders of the defined types were placed in this encounter.   Will obtain fasting labs during next OV.  FOR THE DISEASE OF OBESITY:  BMI 28.0-28.9,adult - Current BMI 27.77 Obesity (BMI 30-39.9) - Starting BMI 30.52 Assessment & Plan: Since last office visit on 03/26/2024, patient's muscle mass has increased by 5.4 lbs. Fat mass has decreased by 5.6 lbs. Total body water  has increased by 1.4 lbs.  Counseling done on how various foods will affect these numbers and how to maximize success  Total lbs lost to date: 17 lbs Total weight loss percentage to date: 8.99%    Recommended Dietary Goals [name] is currently in the action stage of change. As such, her goal is to continue weight management plan.  She has agreed to: continue current plan   Behavioral Intervention We discussed the following today: focusing on food with a 10:1 ratio of calories: grams of protein, high protein cereal alternatives, Low carb bread options,   Additional resources provided today: Personalized instruction on finding high protein snack options  Evidence-based interventions for health behavior change were utilized today including the discussion of self monitoring techniques, problem-solving barriers and SMART goal setting techniques.   Regarding patient's less desirable eating habits and patterns, we employed the technique of small changes.   Pt will specifically work on: n/a   Recommended Physical Activity Goals [name] has been advised to work up to 300-450  minutes of moderate intensity aerobic activity a week and strengthening exercises 2-3 times per week for cardiovascular health, weight loss maintenance and preservation of muscle mass.   She has agreed to :  Continue current level of physical activity    Pharmacotherapy We both agreed to : continue with nutritional and behavioral strategies and current medication regimen   FOR ASSOCIATED CONDITIONS ADDRESSED TODAY:  Other Specified Feeding or Eating Disorder, Emotional Eating Behaviors Assessment & Plan: [name] admits to still indulging in some emotional eating but condition has improved. She met with Dr. Delaine [name], pt reports this is helping to increase mindfulness when eating. Discussed how thoughts affect eating habits, modeling of thoughts, feelings, and behaviors, and strategies for change.  Importance of not skipping meals and getting all her proteins and fiber in on a daily basis discussed. Reminded patient of the importance of following their prudent nutrition plan and how food can affect mood as well to support emotional wellbeing. Will continue to monitor alongside specialist.    OSA (obstructive sleep apnea) on CPAP Assessment & Plan: [name] recently received her CPAP machine on Monday. She tried to use it for the first time but experienced a panic attack. To calm her nerves pt took Xanax , but this only allowed her to keep the machine on for two hours before taking it off. Reminded of importance of improving condition with CPAP for mood, energy, weight loss, and quality of sleep. Explained why machine might apply high pressure of oxygen while sleeping. Encouraged to keep wearing to get more comfortable in order to wear for at least 4 hours. Will monitor condition closely as  it pertains to her weight loss journey.    Vitamin D  deficiency Assessment & Plan: [name] is taking ERGO 50,000 units once weekly. No adverse SE reported. No acute concerns today. Reminded pt of importance of  optimal Vit D levels. Continue supplementation regimen. Will continue to monitor.   Follow up:   Return in about 3 weeks (around 05/06/2024) for fasting labs: no caffeine, no exercise, and no sex. She was informed of the importance of frequent follow up visits to maximize her success with intensive lifestyle modifications for her multiple health conditions.  Subjective:   Chief complaint: Obesity [name] is here to discuss her progress with her obesity treatment plan. She is on the the Category 1 Plan with B/L options and states she is following her eating plan approximately 85% of the time.  Interval History:  [name] is here for a follow up office visit. Since last OV on 03/26/2024, pt's weight has not changed. She is satisfied with foods on her MP and eating essentially on plan. Pt endorses prioritizing high protein in her diet, and increasing her water  intake.   Pharmacotherapy for weight loss: She is currently taking no anti-obesity medications.   Review of Systems:  Pertinent positives were addressed with patient today.  Reviewed by clinician on day of visit: allergies, medications, problem list, medical history, surgical history, family history, social history, and previous encounter notes.  Weight Summary and Biometrics   Weight Lost Since Last Visit: 0lb  Weight Gained Since Last Visit: 0lb    Vitals Temp: 97.7 F (36.5 C) BP: 138/76 Pulse Rate: 77 SpO2: 98 %   Anthropometric Measurements Height: 5\' 6"  (1.676 m) Weight: 172 lb (78 kg) BMI (Calculated): 27.77 Weight at Last Visit: 172lb Weight Lost Since Last Visit: 0lb Weight Gained Since Last Visit: 0lb Starting Weight: 189lb Total Weight Loss (lbs): 17 lb (7.711 kg) Peak Weight: 250lb   Body Composition  Body Fat %: 35 % Fat Mass (lbs): 60.4 lbs Muscle Mass (lbs): 106.6 lbs Total Body Water  (lbs): 71.2 lbs Visceral Fat Rating : 8   Other Clinical Data Fasting: No Labs: No Today's Visit  #: 6 Starting Date: 01/20/24    Objective:   PHYSICAL EXAM: Blood pressure 138/76, pulse 77, temperature 97.7 F (36.5 C), height 5\' 6"  (1.676 m), weight 172 lb (78 kg), last menstrual period 12/24/2006, SpO2 98%. Body mass index is 27.76 kg/m.  General: she is overweight, cooperative and in no acute distress. PSYCH: Has normal mood, affect and thought process.   HEENT: EOMI, sclerae are anicteric. Lungs: Normal breathing effort, no conversational dyspnea. Extremities: Moves * 4 Neurologic: A and O * 3, good insight  DIAGNOSTIC DATA REVIEWED: BMET    Component Value Date/Time   NA 143 01/20/2024 0953   K 4.5 01/20/2024 0953   CL 105 01/20/2024 0953   CO2 24 01/20/2024 0953   GLUCOSE 97 01/20/2024 0953   GLUCOSE 95 09/04/2023 0837   BUN 13 01/20/2024 0953   CREATININE 0.72 01/20/2024 0953   CALCIUM  9.9 01/20/2024 0953   GFRNONAA >60 06/08/2022 2009   GFRAA >60 11/15/2017 2142   Lab Results  Component Value Date   HGBA1C 5.5 01/20/2024   HGBA1C 5.6 10/17/2015   Lab Results  Component Value Date   INSULIN  11.2 01/20/2024   Lab Results  Component Value Date   TSH 2.190 01/20/2024   CBC    Component Value Date/Time   WBC 4.7 01/20/2024 0953   WBC 4.9 09/04/2023 0837  RBC 4.87 01/20/2024 0953   RBC 4.63 09/04/2023 0837   HGB 14.3 01/20/2024 0953   HCT 42.3 01/20/2024 0953   PLT 253 01/20/2024 0953   MCV 87 01/20/2024 0953   MCH 29.4 01/20/2024 0953   MCH 28.4 06/08/2022 2009   MCHC 33.8 01/20/2024 0953   MCHC 32.6 09/04/2023 0837   RDW 12.8 01/20/2024 0953   Iron Studies    Component Value Date/Time   IRON 89 11/14/2022 1321   TIBC 326.2 11/14/2022 1321   FERRITIN 87.8 11/14/2022 1321   IRONPCTSAT 27.3 11/14/2022 1321   Lipid Panel     Component Value Date/Time   CHOL 213 (H) 01/20/2024 0953   TRIG 100 01/20/2024 0953   HDL 54 01/20/2024 0953   CHOLHDL 3 09/04/2023 0837   VLDL 17.0 09/04/2023 0837   LDLCALC 141 (H) 01/20/2024 0953    LDLDIRECT 118.0 12/28/2019 1323   Hepatic Function Panel     Component Value Date/Time   PROT 7.1 01/20/2024 0953   ALBUMIN 4.5 01/20/2024 0953   AST 15 01/20/2024 0953   ALT 13 01/20/2024 0953   ALKPHOS 101 01/20/2024 0953   BILITOT 0.3 01/20/2024 0953   BILIDIR 0.1 09/04/2023 0837      Component Value Date/Time   TSH 2.190 01/20/2024 0953   Nutritional Lab Results  Component Value Date   VD25OH 33.4 01/20/2024   VD25OH 37.16 04/04/2020   VD25OH 28.91 (L) 01/26/2020    Attestations:   I, Camryn Mix, acting as a Stage manager for [employer], DO., have compiled all relevant documentation for today's office visit on behalf of Brittany Sensor, DO, while in the presence of [employer], DO.  I have reviewed the above documentation for accuracy and completeness, and I agree with the above. [name], D.O.  The 21st Century Cures Act was signed into law in 2016 which includes the topic of electronic health records.  This provides immediate access to information in MyChart.  This includes consultation notes, operative notes, office notes, lab results and pathology reports.  If you have any questions about what you read please let us  know at your next visit so we can discuss your concerns and take corrective action if need be.  We are right here with you.

## 2024-04-16 ENCOUNTER — Encounter: Payer: Self-pay | Admitting: Family Medicine

## 2024-04-17 ENCOUNTER — Telehealth: Payer: Self-pay

## 2024-04-17 NOTE — Telephone Encounter (Signed)
 Forwarding message  Copied from CRM [id number]. Topic: Clinical - Medication Question >> Apr 17, 2024  1:51 PM Allyne Areola wrote: Reason for CRM: Patient is calling to follow up on a message that was sent yesterday regarding a PA for Zepbound. She would like to speak with Haskell Linker if possible.

## 2024-04-17 NOTE — Telephone Encounter (Signed)
 Spoke with pt advised that her request for Zepbound was sent to the PA team

## 2024-04-17 NOTE — Telephone Encounter (Signed)
 Please try resend a PA for Zepbound per pt request

## 2024-04-20 ENCOUNTER — Ambulatory Visit (INDEPENDENT_AMBULATORY_CARE_PROVIDER_SITE_OTHER): Admitting: Podiatry

## 2024-04-20 ENCOUNTER — Telehealth: Payer: Self-pay

## 2024-04-20 ENCOUNTER — Other Ambulatory Visit (HOSPITAL_COMMUNITY): Payer: Self-pay

## 2024-04-20 DIAGNOSIS — G5781 Other specified mononeuropathies of right lower limb: Secondary | ICD-10-CM | POA: Diagnosis not present

## 2024-04-20 NOTE — Telephone Encounter (Signed)
 Pharmacy Patient Advocate Encounter   Received notification from Patient Advice Request messages that prior authorization for Zepbound 2.5mg /0.70ml is required/requested.   Insurance verification completed.   The patient is insured through [facility] .   Per test claim: PA required; PA started via CoverMyMeds. KEY [id number] . Waiting for clinical questions to populate.    Placed a call to [employer] to get a copy of the denial letter to see about doing an appeal and their system is down and they said to call back at a later time.   Phone# [phone]

## 2024-04-20 NOTE — Progress Notes (Signed)
 Chief Complaint  Patient presents with   Neuroma   HPI: 58 y.o. female presents today for follow-up of right third interspace neuroma.  She states that she is 99% better.  She is very pleased with her progress.  She notes that her her power steps had peeling along the top-cover and it rolled off and no longer has a top-cover.  She is wondering if she may benefit from custom molded orthotics.  Past Medical History:  Diagnosis Date   ADHD    Allergy    Anxiety    Back pain    CAD (coronary artery disease)    Constipated    Depression    Fatty liver    GERD (gastroesophageal reflux disease)    History of abnormal cervical Pap smear    1991 -- 1995--hx cryotherapy to cervix   History of colon polyps    2008- BENIGN   Hydronephrosis, left    Kidney stones 2016   OSA (obstructive sleep apnea)    PONV (postoperative nausea and vomiting)    RLS (restless legs syndrome)    Rosacea    Vitamin D  deficiency     Past Surgical History:  Procedure Laterality Date   ABDOMINAL HYSTERECTOMY     ANTERIOR AND POSTERIOR REPAIR WITH SACROSPINOUS FIXATION N/A 08/16/2015   Procedure: ANTERIOR COLPORRHAPHY WITH XENOFORM GRAFT AND SACROSPINOUS FIXATION;  Surgeon: Greta Leatherwood, MD;  Location: [facility];  Service: Gynecology;  Laterality: N/A;  2.5 hours OR time   BLADDER SUSPENSION N/A 08/16/2015   Procedure: TRANSVAGINAL TAPE (TVT) PROCEDURE exact midurethral sling;  Surgeon: Greta Leatherwood, MD;  Location: [facility];  Service: Gynecology;  Laterality: N/A;   CHOLECYSTECTOMY  1998   COLONOSCOPY  08/21/2017   per Dr. Howard Macho, clear, repeat in 10 yrs   CYSTOCELE REPAIR N/A 12/12/2022   Procedure: ANTERIOR REPAIR (CYSTOCELE);  Surgeon: Arma Lamp, MD;  [facility];  Service: Gynecology;  Laterality: N/A;   CYSTOSCOPY N/A 08/16/2015   Procedure: CYSTOSCOPY;  Surgeon: Greta Leatherwood, MD;  Location: [facility];  Service: Gynecology;   Laterality: N/A;   CYSTOSCOPY N/A 12/12/2022   Procedure: CYSTOSCOPY;  Surgeon: Arma Lamp, MD;  Location: [facility];  Service: Gynecology;  Laterality: N/A;   CYSTOSCOPY W/ RETROGRADES Bilateral 06/22/2015   Procedure: CYSTOSCOPY WITH RETROGRADE PYELOGRAM;  Surgeon: Marco Severs, MD;  Location: [facility];  Service: Urology;  Laterality: Bilateral;   CYSTOSCOPY W/ URETERAL STENT PLACEMENT  01/1999   CYSTOSCOPY WITH HOLMIUM LASER LITHOTRIPSY Left 05/18/2015   Procedure: CYSTOSCOPY WITH HOLMIUM LASER LITHOTRIPSY;  Surgeon: Osborn Blaze, MD;  Location[facility];  Service: Urology;  Laterality: Left;   CYSTOSCOPY WITH RETROGRADE PYELOGRAM, URETEROSCOPY AND STENT PLACEMENT Left 06/22/2015   Procedure: CYSTOSCOPY,  LEFT URETEROSCOPY;  Surgeon: Marco Severs, MD;  Location: [facility];  Service: Urology;  Laterality: Left;   CYSTOSCOPY WITH URETEROSCOPY AND STENT PLACEMENT Left 05/18/2015   Procedure: CYSTOSCOPY WITH URETEROSCOPY, STONE MANIPULATION AND STENT PLACEMENT;  Surgeon: Osborn Blaze, MD;  Location: [facility];  Service: Urology;  Laterality: Left;   DIAGNOSTIC LAPAROSCOPY     DX LAPAROSCOPY  X2   ESOPHAGOGASTRODUODENOSCOPY  05/09/2007   EXPLORATORY LAPAROTOMY W/ BILATERAL SALPINGECTOMY AND REMOVAL RIGHT ECTOPIC PREG.  02/23/2004   EXTRACORPOREAL SHOCK WAVE LITHOTRIPSY  2001  &  2002   LAPAROSCOPIC CHOLECYSTECTOMY  1999  POLYPECTOMY     ROBOTIC ASSISTED LAPAROSCOPIC SACROCOLPOPEXY N/A 12/12/2022   Procedure: XI ROBOTIC ASSISTED LAPAROSCOPIC SACROCOLPOPEXY;  Surgeon: Arma Lamp, MD;  Location[facility];  Service: Gynecology;  Laterality: N/A;  Total time requested 3 hours -Assist requested   SHOULDER ARTHROSCOPY WITH OPEN ROTATOR CUFF REPAIR Right 2012   TOTAL ABDOMINAL HYSTERECTOMY W/ BILATERAL OOPHORECTOMY AND LYSIS ADHESIONS  09/02/2007   TUBAL  LIGATION Bilateral 1995   UMBILICAL HERNIA REPAIR  2003   unsure if she has mesh   VAGINAL HYSTERECTOMY N/A 08/18/2015   Procedure: Exam under Anesthesia, Excision Xenform Graft, Removal Bilateral Sacrospinous Ligament sutures;  Surgeon: Greta Leatherwood, MD;  Location: [facility];  Service: Gynecology;  Laterality: N/A;    Allergies  Allergen Reactions   Desvenlafaxine Other (See Comments)    Reaction:  Withdrawal    Prochlorperazine  Other (See Comments)    Altered mental status   Iodinated Contrast Media Rash and Other (See Comments)    Previously mis-entered as Iohexol  allergy. Patient is not allergic to Non-Ionic contrast currently.    Physical Exam: Palpable pedal pulses noted.  There is no pain on palpation, or with compression, of the right third interspace.  Negative Mulder sign on palpation today.  No edema in the plantar sulcus.  Assessment/Plan of Care: 1. 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  Patient showed me her power step inserts and how the top-cover have peeled off since last visit.  She was given a replacement pair as courtesy today.  Recommend custom orthotics which would be more durable in the long run.  She was given an orthotic estimate form with the accurate diagnosis codes and will call her insurance to check on coverage.  If she like to proceed with this she can call our office and schedule with our pedorthist directly.  Follow-up as needed  Joe Murders, DPM, FACFAS [facility]     [address] [place], [place] [address]                Office [phone]  Fax [phone]

## 2024-04-21 ENCOUNTER — Other Ambulatory Visit: Payer: Self-pay

## 2024-04-21 ENCOUNTER — Emergency Department (HOSPITAL_BASED_OUTPATIENT_CLINIC_OR_DEPARTMENT_OTHER): Admitting: Radiology

## 2024-04-21 ENCOUNTER — Emergency Department (HOSPITAL_BASED_OUTPATIENT_CLINIC_OR_DEPARTMENT_OTHER)
Admission: EM | Admit: 2024-04-21 | Discharge: 2024-04-22 | Disposition: A | Discharge status: Home / Self Care | Attending: Emergency Medicine | Admitting: Emergency Medicine

## 2024-04-21 DIAGNOSIS — R079 Chest pain, unspecified: Secondary | ICD-10-CM | POA: Insufficient documentation

## 2024-04-21 DIAGNOSIS — R1013 Epigastric pain: Secondary | ICD-10-CM | POA: Diagnosis present

## 2024-04-21 DIAGNOSIS — Z7982 Long term (current) use of aspirin: Secondary | ICD-10-CM | POA: Insufficient documentation

## 2024-04-21 LAB — COMPREHENSIVE METABOLIC PANEL WITH GFR
ALT: 13 U/L (ref 0–44)
AST: 18 U/L (ref 15–41)
Albumin: 4.4 g/dL (ref 3.5–5.0)
Alkaline Phosphatase: 86 U/L (ref 38–126)
Anion gap: 11 (ref 5–15)
BUN: 26 mg/dL — ABNORMAL HIGH (ref 6–20)
CO2: 25 mmol/L (ref 22–32)
Calcium: 10.1 mg/dL (ref 8.9–10.3)
Chloride: 103 mmol/L (ref 98–111)
Creatinine, Ser: 0.77 mg/dL (ref 0.44–1.00)
GFR, Estimated: >60 mL/min (ref >60)
Glucose, Bld: 90 mg/dL (ref 70–99)
Potassium: 4 mmol/L (ref 3.5–5.1)
Sodium: 139 mmol/L (ref 135–145)
Total Bilirubin: 0.3 mg/dL (ref 0.0–1.2)
Total Protein: 7 g/dL (ref 6.5–8.1)

## 2024-04-21 LAB — CBC
HCT: 38.7 % (ref 36.0–46.0)
Hemoglobin: 12.9 g/dL (ref 12.0–15.0)
MCH: 28.7 pg (ref 26.0–34.0)
MCHC: 33.3 g/dL (ref 30.0–36.0)
MCV: 86 fL (ref 80.0–100.0)
Platelets: 210 10*3/uL (ref 150–400)
RBC: 4.5 MIL/uL (ref 3.87–5.11)
RDW: 13.2 % (ref 11.5–15.5)
WBC: 6.7 10*3/uL (ref 4.0–10.5)
nRBC: 0 % (ref 0.0–0.2)

## 2024-04-21 LAB — LIPASE, BLOOD: Lipase: 47 U/L (ref 11–51)

## 2024-04-21 LAB — TROPONIN T, HIGH SENSITIVITY: Troponin T High Sensitivity: <15 ng/L (ref <19)

## 2024-04-21 NOTE — Telephone Encounter (Signed)
 Pharmacy Patient Advocate Encounter  Received notification from [employer] that Prior Authorization for Zepbound 2.5mg /0.41ml has been DENIED.  See denial reason below. No denial letter attached in CMM. Will attach denial letter to Media tab once received.   PA #/Case ID/Reference #: [id number]

## 2024-04-21 NOTE — Telephone Encounter (Signed)
 Clinical questions answered and PA submitted.

## 2024-04-21 NOTE — ED Triage Notes (Signed)
 Epigastric pain several hours. Nausea no emesis. Some SOB due to pain.

## 2024-04-22 ENCOUNTER — Encounter (HOSPITAL_BASED_OUTPATIENT_CLINIC_OR_DEPARTMENT_OTHER): Payer: Self-pay | Admitting: Emergency Medicine

## 2024-04-22 ENCOUNTER — Emergency Department (HOSPITAL_BASED_OUTPATIENT_CLINIC_OR_DEPARTMENT_OTHER)

## 2024-04-22 ENCOUNTER — Telehealth: Payer: Self-pay | Admitting: Pharmacist

## 2024-04-22 MED ORDER — ONDANSETRON HCL 4 MG/2ML IJ SOLN
4.0000 mg | Freq: Once | INTRAMUSCULAR | Status: AC
  Administered 2024-04-22: 4 mg via INTRAVENOUS
  Filled 2024-04-22: qty 2

## 2024-04-22 MED ORDER — MORPHINE SULFATE (PF) 4 MG/ML IV SOLN
4.0000 mg | Freq: Once | INTRAVENOUS | Status: AC
  Administered 2024-04-22: 4 mg via INTRAVENOUS
  Filled 2024-04-22: qty 1

## 2024-04-22 MED ORDER — IOHEXOL 300 MG/ML  SOLN
100.0000 mL | Freq: Once | INTRAMUSCULAR | Status: AC | PRN
  Administered 2024-04-22: 85 mL via INTRAVENOUS

## 2024-04-22 MED ORDER — PANTOPRAZOLE SODIUM 20 MG PO TBEC: 20.0000 mg | DELAYED_RELEASE_TABLET | Freq: Every day | ORAL | 0 refills | Status: DC

## 2024-04-22 MED ORDER — HYDROCODONE-ACETAMINOPHEN 5-325 MG PO TABS: 1.0000 | ORAL_TABLET | Freq: Four times a day (QID) | ORAL | 0 refills | Status: DC | PRN

## 2024-04-22 MED ORDER — SODIUM CHLORIDE 0.9 % IV BOLUS
1000.0000 mL | Freq: Once | INTRAVENOUS | Status: AC
  Administered 2024-04-22: 1000 mL via INTRAVENOUS

## 2024-04-22 NOTE — ED Provider Notes (Signed)
 [place] EMERGENCY DEPARTMENT AT [facility] Provider Note   CSN: [id number] Arrival date & time: 04/21/24  2246     History  Chief Complaint  Patient presents with   Abdominal Pain   Chest Pain    [name] is a 58 y.o. female.  Patient is a 58 year old female with history of prior cholecystectomy, anxiety, hyperlipidemia, migraines.  Patient presenting today for evaluation of abdominal pain.  She describes pain to the epigastric region that started this afternoon.  No vomiting or diarrhea.  No fevers or chills.  Pain is constant and worse with palpation and movement.  No alleviating factors.       Home Medications Prior to Admission medications   Medication Sig Start Date End Date Taking? Authorizing Provider  ALPRAZolam  (XANAX ) 1 MG tablet Take 1 tablet (1 mg total) by mouth 2 (two) times daily as needed for anxiety. 07/03/23   Donley Furth, MD  amphetamine -dextroamphetamine  (ADDERALL XR) 20 MG 24 hr capsule Take 1 capsule (20 mg total) by mouth every morning. 04/14/24   Donley Furth, MD  aspirin  EC 81 MG tablet Take 1 tablet (81 mg total) by mouth daily. Swallow whole. 02/13/24   Donley Furth, MD  atorvastatin  (LIPITOR) 40 MG tablet Take 1 tablet (40 mg total) by mouth daily. 03/27/24   Wendie Hamburg, MD  cyanocobalamin  (VITAMIN B12) 500 MCG tablet 300 mcg- 500 mcg daily 02/03/24   Marceil Sensor, DO  estradiol  (ESTRACE ) 0.1 MG/GM vaginal cream Place 0.5g nightly for two weeks then twice a week after 08/12/23   Arma Lamp, MD  nitroGLYCERIN  (NITROSTAT ) 0.4 MG SL tablet Place 1 tablet (0.4 mg total) under the tongue every 5 (five) minutes as needed for chest pain. 3 doses max in a 24 hour period 03/27/24 06/25/24  Wendie Hamburg, MD  tirzepatide [facility]) 2.5 MG/0.5ML injection vial Inject 2.5 mg into the skin once a week. Patient not taking: Reported on 04/15/2024 04/01/24   Wendie Hamburg, MD  Vitamin D , Ergocalciferol ,  (DRISDOL ) 1.25 MG (50000 UNIT) CAPS capsule Take 1 capsule (50,000 Units total) by mouth every 7 (seven) days. 03/26/24   Marceil Sensor, DO      Allergies    Desvenlafaxine, Prochlorperazine , and Iodinated contrast media    Review of Systems   Review of Systems  All other systems reviewed and are negative.   Physical Exam Updated Vital Signs BP (!) 151/95   Pulse 78   Temp 97.6 F (36.4 C) (Oral)   Resp 18   LMP 12/24/2006 (Approximate)   SpO2 100%  Physical Exam Vitals and nursing note reviewed.  Constitutional:      General: She is not in acute distress.    Appearance: She is well-developed. She is not diaphoretic.  HENT:     Head: Normocephalic and atraumatic.  Cardiovascular:     Rate and Rhythm: Normal rate and regular rhythm.     Heart sounds: No murmur heard.    No friction rub. No gallop.  Pulmonary:     Effort: Pulmonary effort is normal. No respiratory distress.     Breath sounds: Normal breath sounds. No wheezing.  Abdominal:     General: Bowel sounds are normal. There is no distension.     Palpations: Abdomen is soft.     Tenderness: There is abdominal tenderness in the epigastric area. There is no right CVA tenderness, left CVA tenderness, guarding or rebound.  Musculoskeletal:  General: Normal range of motion.     Cervical back: Normal range of motion and neck supple.  Skin:    General: Skin is warm and dry.  Neurological:     General: No focal deficit present.     Mental Status: She is alert and oriented to person, place, and time.     ED Results / Procedures / Treatments   Labs (all labs ordered are listed, but only abnormal results are displayed) Labs Reviewed  COMPREHENSIVE METABOLIC PANEL WITH GFR - Abnormal; Notable for the following components:      Result Value   BUN 26 (*)    All other components within normal limits  LIPASE, BLOOD  CBC  URINALYSIS, ROUTINE W REFLEX MICROSCOPIC  TROPONIN T, HIGH SENSITIVITY    EKG EKG  Interpretation Date/Time:  Tuesday April 21 2024 23:01:33 EDT Ventricular Rate:  79 PR Interval:  156 QRS Duration:  80 QT Interval:  382 QTC Calculation: 438 R Axis:   -2  Text Interpretation: Normal sinus rhythm Right atrial enlargement Low voltage QRS Borderline ECG No significant change since 03/27/2024 Confirmed by Orvilla Blander ([id number]) on 04/21/2024 11:09:06 PM  Radiology DG Chest 2 View Result Date: 04/21/2024 CLINICAL DATA:  Chest pain and shortness of breath. EXAM: CHEST - 2 VIEW COMPARISON:  PA Lat chest fall at 05/31/2017. FINDINGS: The heart size and mediastinal contours are within normal limits. There is subtle increased hazy opacity in the peripheral left upper lung field concerning for a small pneumonia The remaining lungs are clear. Clinical correlation and radiographic follow-up recommended. The the visualized skeletal structures are intact, with moderate thoracic spondylosis. Cholecystectomy clips are noted upper abdomen. IMPRESSION: Subtle increased hazy opacity in the peripheral left upper lung field concerning for a small pneumonia. Clinical correlation and radiographic follow-up recommended. Electronically Signed   By: Denman Fischer [name]   On: 04/21/2024 23:17    Procedures Procedures    Medications Ordered in ED Medications  sodium chloride  0.9 % bolus 1,000 mL (has no administration in time range)  ondansetron  (ZOFRAN ) injection 4 mg (has no administration in time range)  morphine  (PF) 4 MG/ML injection 4 mg (has no administration in time range)    ED Course/ Medical Decision Making/ A&P  Patient presenting here with epigastric pain as described in the HPI.  She arrives with stable vital signs and is afebrile.  Physical examination reveals mild tenderness in the epigastric region, but no peritoneal signs.  Laboratory studies obtained including CBC, CMP, and lipase, all of which are unremarkable.  There is no leukocytosis, no elevation of liver or pancreatic  enzymes, and no electrolyte derangement.  CT scan of the abdomen pelvis obtained showing increased postcholecystectomy intrahepatic and extrahepatic biliary prominence, but no other acute findings.  Patient has received morphine  for her pain along with IV fluids and seems to be feeling better.  Because of her abdominal pain likely gastritis as remainder of the workup is unremarkable.  Patient will be discharged with Protonix , pain medication, and follow-up with primary doctor.  Final Clinical Impression(s) / ED Diagnoses Final diagnoses:  None    Rx / DC Orders ED Discharge Orders     None         Orvilla Blander, MD 04/22/24 [phone]

## 2024-04-22 NOTE — Telephone Encounter (Signed)
 Appeal has been submitted for Zepbound. Will advise when response is received, please be advised that most companies may take 30 days to make a decision. Appeal letter and supporting documentation have been faxed to [phone] on 04/22/2024 @12 :30 pm.   Thank you, Dene Fines, PharmD Clinical Pharmacist  [facility]  Direct Dial: [phone]

## 2024-04-22 NOTE — Discharge Instructions (Signed)
 Begin taking Protonix  as prescribed.  Begin taking hydrocodone  as prescribed as needed for pain.  Follow-up with primary doctor if symptoms are not improving in the next few days.

## 2024-04-23 NOTE — Telephone Encounter (Signed)
 The appeal for Zepbound has been denied by the insurance for the following reason:

## 2024-04-24 NOTE — Telephone Encounter (Signed)
 I looked the CXR and CT results. I did not see anything that we need to worry about

## 2024-04-26 ENCOUNTER — Other Ambulatory Visit (INDEPENDENT_AMBULATORY_CARE_PROVIDER_SITE_OTHER): Payer: Self-pay | Admitting: Family Medicine

## 2024-04-26 DIAGNOSIS — E559 Vitamin D deficiency, unspecified: Secondary | ICD-10-CM

## 2024-04-27 ENCOUNTER — Telehealth (INDEPENDENT_AMBULATORY_CARE_PROVIDER_SITE_OTHER): Admitting: Psychology

## 2024-04-27 DIAGNOSIS — F419 Anxiety disorder, unspecified: Secondary | ICD-10-CM

## 2024-04-27 DIAGNOSIS — F5089 Other specified eating disorder: Secondary | ICD-10-CM | POA: Diagnosis not present

## 2024-04-27 NOTE — Progress Notes (Signed)
  Office: [phone]  /  Fax: [phone]    Date: Apr 27, 2024  Appointment Start Time: 2:01pm Duration: 25 minutes Provider: Catherene Close, Psy.D. Type of Session: Individual Therapy  Location of Patient: Home (private location) Location of Provider: Provider's Home (private office) Type of Contact: Telepsychological Visit via MyChart Video Visit  Session Content: [name] is a 58 y.o. female presenting for a follow-up appointment to address the previously established treatment goal of increasing coping skills.Today's appointment was a telepsychological visit. [name] provided verbal consent for today's telepsychological appointment and she is aware she is responsible for securing confidentiality on her end of the session. Prior to proceeding with today's appointment, [name]'s physical location at the time of this appointment was obtained as well a phone number she could be reached at in the event of technical difficulties. [name] and this provider participated in today's telepsychological service.   This provider conducted a brief check-in. [name] shared about recent eating habits, noting "It's going pretty good." She described experiencing "food noise" while at events. Reviewed emotional and physical hunger. Psychoeducation regarding triggers for emotional eating was provided. [name] was provided a handout, and encouraged to utilize the handout between now and the next appointment to increase awareness of triggers and frequency. [name] agreed. This provider also discussed behavioral strategies for specific triggers, such as placing the utensil down when conversing to avoid mindless eating. [name] provided verbal consent during today's appointment for this provider to send a handout about triggers via e-mail. Overall, [name] was receptive to today's appointment as evidenced by openness to sharing, responsiveness to feedback, and willingness to explore triggers for emotional eating.  Mental Status  Examination:  Appearance: neat Behavior: appropriate to circumstances Mood: neutral Affect: mood congruent Speech: WNL Eye Contact: appropriate Psychomotor Activity: WNL Gait: unable to assess Thought Process: linear, logical, and goal directed and no evidence or endorsement of suicidal, homicidal, and self-harm ideation, plan and intent  Thought Content/Perception: no hallucinations, delusions, bizarre thinking or behavior endorsed or observed Orientation: AAOx4 Memory/Concentration: intact Insight: good Judgment: good  Interventions:  Conducted a brief chart review Provided empathic reflections and validation Reviewed content from the previous session Provided positive reinforcement Employed supportive psychotherapy interventions to facilitate reduced distress and to improve coping skills with identified stressors Psychoeducation provided regarding triggers for emotional eating behaviors  DSM-5 Diagnosis(es):  F50.89 Other Specified Feeding or Eating Disorder, Emotional Eating Behaviors and F41.9 Unspecified Anxiety Disorder  Treatment Goal & Progress: During the initial appointment with this provider, the following treatment goal was established: increase coping skills. Progress is limited, as [name] has just begun treatment with this provider; however, she is receptive to the interaction and interventions and rapport is being established.   Plan: The next appointment is scheduled for 05/25/2024 at 2:30pm, which will be via MyChart Video Visit. The next session will focus on working towards the established treatment goal. [name] will continue with her primary therapist.    Catherene Close, PsyD

## 2024-05-05 ENCOUNTER — Ambulatory Visit (INDEPENDENT_AMBULATORY_CARE_PROVIDER_SITE_OTHER): Admitting: Psychology

## 2024-05-05 ENCOUNTER — Encounter (INDEPENDENT_AMBULATORY_CARE_PROVIDER_SITE_OTHER): Payer: Self-pay | Admitting: Family Medicine

## 2024-05-05 ENCOUNTER — Ambulatory Visit (INDEPENDENT_AMBULATORY_CARE_PROVIDER_SITE_OTHER): Admitting: Family Medicine

## 2024-05-05 VITALS — BP 101/63 | HR 72 | Temp 97.9°F | Ht 66.0 in | Wt 169.0 lb

## 2024-05-05 DIAGNOSIS — E782 Mixed hyperlipidemia: Secondary | ICD-10-CM | POA: Diagnosis not present

## 2024-05-05 DIAGNOSIS — F411 Generalized anxiety disorder: Secondary | ICD-10-CM

## 2024-05-05 DIAGNOSIS — R7303 Prediabetes: Secondary | ICD-10-CM | POA: Diagnosis not present

## 2024-05-05 DIAGNOSIS — F4321 Adjustment disorder with depressed mood: Secondary | ICD-10-CM | POA: Diagnosis not present

## 2024-05-05 DIAGNOSIS — Z6827 Body mass index (BMI) 27.0-27.9, adult: Secondary | ICD-10-CM

## 2024-05-05 DIAGNOSIS — E538 Deficiency of other specified B group vitamins: Secondary | ICD-10-CM

## 2024-05-05 DIAGNOSIS — E669 Obesity, unspecified: Secondary | ICD-10-CM

## 2024-05-05 DIAGNOSIS — F5089 Other specified eating disorder: Secondary | ICD-10-CM | POA: Diagnosis not present

## 2024-05-05 DIAGNOSIS — E559 Vitamin D deficiency, unspecified: Secondary | ICD-10-CM

## 2024-05-05 DIAGNOSIS — G4733 Obstructive sleep apnea (adult) (pediatric): Secondary | ICD-10-CM

## 2024-05-05 MED ORDER — BUPROPION HCL ER (SR) 100 MG PO TB12: 100.0000 mg | ORAL_TABLET | Freq: Two times a day (BID) | ORAL | 1 refills | Status: DC

## 2024-05-05 MED ORDER — VITAMIN D (ERGOCALCIFEROL) 1.25 MG (50000 UNIT) PO CAPS: 50000.0000 [IU] | ORAL_CAPSULE | ORAL | 1 refills | Status: DC

## 2024-05-05 NOTE — Progress Notes (Signed)
 [facility] Counselor/Therapist Progress Note  Patient ID: [name], MRN: [id number],    Date: 05/05/24   Time Spent: 12:00pm-12:50pm  Treatment Type: Individual Therapy  Pt is seen for a virtual video visit via caregility. Pt consents to virtual visit and is aware of limitations of virtual visits.  Pt joins from her home, reporting privacy, and counselor from her home office.     Reported Symptoms: pt reports increased emotional escalations and recent conflict w/ husband.  Pt reports increased awareness about.  Mental Status Exam: Appearance:  Well Groomed     Behavior: Appropriate  Motor: Normal  Speech/Language:  Normal Rate  Affect: Appropriate  Mood: anxious  Thought process: normal  Thought content:   WNL  Sensory/Perceptual disturbances:   WNL  Orientation: oriented to person, place, time/date, and situation  Attention: Good  Concentration: Good  Memory: WNL  Fund of knowledge:  Good  Insight:   Good  Judgment:  Good  Impulse Control: Good   Risk Assessment: Danger to Self:  No Self-injurious Behavior: No Danger to Others: No Duty to Warn:no Physical Aggression / Violence:No  Access to Firearms a concern: No  Gang Involvement:No   Subjective: Counselor assessed pt current functioning per pt report.  Processed w/pt increased emotional escalations.  Explored potential contributing factors and identified coping skills.  Discussed stressors and healthy boundaries w/ not taking more on.   Pt affect wnl.  Pt reports recognizing over past several weeks increased emotional escalations and conflicts w/ husband.  Pt reports awareness of feeling less self control and taking steps to be more mindful.  Pt reports stopping Adderall at this time and reminded of similar patterns w/ last time on Adderall.  Pt reports Dr. [place] starting on Wellbutrin .  Pt discussed awareness that both her and husband impacting conflicts.  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  Pt discussed where needs boundaries for self to not take on more.  Pt shared interactions had w/ customer and how felt dads presence in.    Interventions: Cognitive Behavioral Therapy, Mindfulness Meditation, and supportive  Diagnosis:Generalized anxiety disorder  Grief  Plan: Pt to f/u w/ Counseling in 3 weeks.  Pt to f/u w/ PCP, Cardiologist and [facility] as scheduled.   Individualized Treatment Plan Strengths: enjoys walking and movement, enjoys the beach, family is important to her  Supports: her husband, her daughter in law and her mom    Goal/Needs for Treatment:  In order of importance to patient 1) cope with anxiety 2) increase self worth and decrease negative thought patterns.  3) continue cope through losses       Client Statement of Needs:   "talk about my grief, continue to work through my anxiety, try not be so negative on stuff and thinking things are going to go bad or wrong.  Finding time for self again."        Treatment Level: outpatient counseling  Symptoms:anxiety, worry, ruminating thoughts, low self worth, emotional escalations, depressed mood, sleep disturbance  Client Treatment Preferences: counseling every 2-4 weeks and continue medication management w/ PCP    Healthcare consumer's goal for treatment:   Counselor, [name], [facility] will support the patient's ability to achieve the goals identified. Cognitive Behavioral Therapy, Assertive Communication/Conflict Resolution Training, Relaxation Training, ACT, Humanistic and other evidenced-based practices will be used to promote progress towards healthy functioning.    Healthcare consumer will:  Actively participate in therapy, working towards healthy functioning.     *Justification for Continuation/Discontinuation of Goal: R=Revised, O=Ongoing, A=Achieved, D=Discontinued   Goal 1) Pt will increase coping with life's anxiety  and stressors AEB decreased ruminating worry, decreased emotional escalations, decreased negative thought patterns. That contribute to anxiety. Baseline date 03/10/24: Progress towards goal 40%; How Often - Daily Target Date Goal Was reviewed Status Code Progress towards goal  03/10/25                            Goal 2)  Increase self awareness, consistency w/ self care time and self compassion statements AEB pt report and therapist observation.   Baseline date 03/10/24: Progress towards goal 30; How Often - Daily Target Date Goal Was reviewed Status Code Progress towards goal/Likert rating  03/10/25                            Goal 3) Verbalize feelings of grief related to losses and recognize ways of living with grief present.  Baseline date 03/10/24: Progress towards goal 30; How Often - Daily Target Date Goal Was reviewed Status Code Progress towards goal/Likert rating  03/10/25                            This plan has been reviewed and created by the following participants:  This plan will be reviewed at least every 12 months. Date Behavioral Health Clinician Date Guardian/Patient   03/10/24 [facility] [name] [facility]                      03/10/24 Verbal consent provided and request for electronic signature sent and obtained              [name] [facility]

## 2024-05-05 NOTE — Progress Notes (Signed)
 [name], D.O.  ABFM, ABOM Specializing in Clinical Bariatric Medicine  Office located at: [address] [place], [place]  [address]   Assessment and Plan:   Orders Placed This Encounter  Procedures   Lipid panel   Comprehensive metabolic panel with GFR   VITAMIN D  25 Hydroxy (Vit-D Deficiency, Fractures)   Insulin , random   Hemoglobin A1c   Vitamin B12    Medications Discontinued During This Encounter  Medication Reason   Vitamin D , Ergocalciferol , (DRISDOL ) 1.25 MG (50000 UNIT) CAPS capsule Reorder   buPROPion  ER (WELLBUTRIN  SR) 100 MG 12 hr tablet      Meds ordered this encounter  Medications   Vitamin D , Ergocalciferol , (DRISDOL ) 1.25 MG (50000 UNIT) CAPS capsule    Sig: Take 1 capsule (50,000 Units total) by mouth every 7 (seven) days.    Dispense:  4 capsule    Refill:  1   DISCONTD: buPROPion  ER (WELLBUTRIN  SR) 100 MG 12 hr tablet    Sig: Take 1 tablet (100 mg total) by mouth 2 (two) times daily.    Dispense:  60 tablet    Refill:  1   buPROPion  ER (WELLBUTRIN  SR) 100 MG 12 hr tablet    Sig: Take 1 tablet (100 mg total) by mouth 2 (two) times daily.    Dispense:  60 tablet    Refill:  1     Labs obtained today (CMP, A1c, insulin , lipid panel, Vit D and B12) will be reviewed at next OV.    FOR THE DISEASE OF OBESITY: Obesity (BMI 30-39.9) - Starting BMI 30.52 BMI 27.0-27.9,adult - Current BMI 27.29 Assessment & Plan: Since last office visit on 04/15/24 patient's muscle mass has decreased by 7.4 lb. Fat mass has increased by 4.8 lb. Total body water  has decreased by 2.6 lb.  Counseling done on how various foods will affect these numbers and how to maximize success.  Total lbs lost to date: 20 lbs  Total weight loss percentage to date: -10.58%    Recommended Dietary Goals [name] is currently in the action stage of change. As such, her goal is to continue weight management plan.  She has agreed to: continue current plan   Behavioral  Intervention We discussed the following today: increasing lean protein intake to established goals, increasing water  intake , practice mindfulness eating and understand the difference between hunger signals and cravings, and avoiding temptations and identifying enticing environmental cues  Additional resources provided today: None  Evidence-based interventions for health behavior change were utilized today including the discussion of self monitoring techniques, problem-solving barriers and SMART goal setting techniques.   Regarding patient's less desirable eating habits and patterns, we employed the technique of small changes.   Pt will specifically work on: n/a   Recommended Physical Activity Goals [name] has been advised to work up to 300-450 minutes of moderate intensity aerobic activity a week and strengthening exercises 2-3 times per week for cardiovascular health, weight loss maintenance and preservation of muscle mass.   She has agreed to :  Think about enjoyable ways to increase daily physical activity and overcoming barriers to exercise, Increase physical activity in their day and reduce sedentary time (increase NEAT)., and continue to gradually increase the amount and intensity of exercise routine   Pharmacotherapy We both agreed to : continue with nutritional and behavioral strategies and Restart Wellbutrin   [name] has not been able to have Zepbound covered. We have tried to get Zepbound along with other GLP injectables  covered and Dr. Alyne Babinski (PCP) has as well. These attempts have been made under various diagnoses with no approval of coverage from insurance. Reviewed the option of self-pay if pt would still like to try weight loss injectables.    ASSOCIATED CONDITIONS ADDRESSED TODAY: Prediabetes Assessment & Plan: Lab Results  Component Value Date   HGBA1C 5.5 01/20/2024   HGBA1C 5.7 09/04/2023   HGBA1C 5.6 01/21/2023   INSULIN  11.2 01/20/2024   No meds currently.  Diet/exercise approach. Of note, pt has been trying to get Zepbound and other GLP injectables approved for some time; see pharmacotherapy section above. A1c improved from 5.7 08/2023 to 5.5 in 12/2023. Reviewed ideal A1c levels, pt verbalized understanding. Will continue monitoring condition. Will recheck A1c and fasting insulin  today; results will be reviewed at her next OV.   Orders: - Recheck A1c and insulin    Other Specified Feeding or Eating Disorder, Emotional Eating Behaviors Assessment & Plan: Pt has been following up with Dr. Delaine Favorite; currently working on identifying emotional eating triggers. 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 Pt expressed interest in restarting Wellbutrin . Denies any hx of seizure disorder or other contraindications.   Continue following up with Dr. Delaine Favorite as recommended. Reviewed the possible benefits of Wellbutrin  including improved moods, energy, and emotional eating/cravings as well as the potential risks/side effects. Pt verbalized understanding and we mutually agreed to restart Wellbutrin  SR 100 mg BID. Discussed the times of day to take Wellbutrin  for optimal effectiveness for cravings/food noise; when first waking up and around mid-afternoon. Pt reassures she will remember to take Wellbutrin  BID. Will continue to monitor condition.  Orders: - Restart Wellbutrin  100 mg BID   Mixed hyperlipidemia Assessment & Plan: Lab Results  Component Value Date   CHOL 213 (H) 01/20/2024   HDL 54 01/20/2024   LDLCALC 141 (H) 01/20/2024   LDLDIRECT 118.0 12/28/2019   TRIG 100 01/20/2024   CHOLHDL 3 09/04/2023   Pt is on Atorvastatin  40 mg once daily. Good compliance and tolerance reported. No adverse SE or acute concerns reported in this regard today. Continue statin therapy as prescribed. Encouraged pt to decreased simple carb/sugar intake in an  effort to improve cholesterol components. Will recheck lipid panel and CMP today; results to be reviewed at next OV.   Orders: - Recheck lipid panel and CMP   OSA (obstructive sleep apnea) Assessment & Plan: Improved compliance with CPAP therapy since 4/21. Was previously using CPAP only 1-2 hrs/night due to panic attacks. Now tolerating up to 6 hrs/night with increased comfort and tolerance. Encouraged pt to f/u with sleep specialist as instructed by them. Continue CPAP therapy. Reviewed benefits of CPAP compliance for weight loss and overall health. Will continue to monitor condition as it relates to her weight loss journey.    Vitamin D  deficiency Assessment & Plan: Lab Results  Component Value Date   VD25OH 33.4 01/20/2024   VD25OH 37.16 04/04/2020   VD25OH 28.91 (L) 01/26/2020   Pt is compliant with ERGO 50K units once weekly. Tolerating well with no SE. No acute concerns. Continue with current supplementation as prescribed. Recheck vit D levels and review at next OV.   Orders: - Refill ERGO, no dose change - Recheck Vit D   Vitamin B12 insufficeny Assessment & Plan: Lab Results  Component Value Date   VITAMINB12 411 01/20/2024   VITAMINB12 344 01/10/2016   VITAMINB12 467 04/09/2015  Pt currently taking 1,000 OTC daily. Tolerating well, no SE. I recommend pt either take 300-500 mcg once daily as prescribed or only take 1,000 every other day if she would like to continue taking her current dose. Will recheck B12 today; results to be reviewed at next OV.   Orders: - Recheck Vit B12   Follow up:   Return in 16 days (on 05/21/2024) for Keep upcoming appt w Katy on 5/29, make another appt if pt desires. . She was informed of the importance of frequent follow up visits to maximize her success with intensive lifestyle modifications for her multiple health conditions.  Subjective:   Chief complaint: Obesity [name] is here to discuss her progress with her obesity treatment  plan. She is on the Category 1 Plan with B/L options and states she is following her eating plan approximately 95% of the time. She states she recently started cardio and weight training 20-30 minutes 5 days per week.  Interval History:  [name] is here for a follow up office visit. Since last OV on 04/15/24, she has lost 3 lbs. Pt endorses cravings and increased with temptation when she sees sweets (funnel cakes, ice cream, cakes, etc). 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    Pharmacotherapy for weight loss: She is currently taking no anti-obesity medication.   Review of Systems:  Pertinent positives were addressed with patient today.  Reviewed by clinician on day of visit: allergies, medications, problem list, medical history, surgical history, family history, social history, and previous encounter notes.  Weight Summary and Biometrics   Weight Lost Since Last Visit: 3lb  Weight Gained Since Last Visit: 0    Vitals Temp: 97.9 F (36.6 C) BP: 101/63 Pulse Rate: 72 SpO2: 98 %   Anthropometric Measurements Height: 5\' 6"  (1.676 m) Weight: 169 lb (76.7 kg) BMI (Calculated): 27.29 Weight at Last Visit: 172lb Weight Lost Since Last Visit: 3lb Weight Gained Since Last Visit: 0 Starting Weight: 189lb Total Weight Loss (lbs): 20 lb (9.072 kg) Peak Weight: 250lb Waist Measurement : 36.5 inches   Body Composition  Body Fat %: 38.5 % Fat Mass (lbs): 65.2 lbs Muscle Mass (lbs): 99.2 lbs Total Body Water  (lbs): 68.6 lbs Visceral Fat Rating : 9   Other Clinical Data Fasting: yes Labs: yes Today's Visit #: 7 Starting Date: 01/20/24    Objective:   PHYSICAL EXAM: Blood pressure 101/63, pulse 72, temperature 97.9 F (36.6 C), height 5\' 6"  (1.676 m), weight 169 lb (76.7 kg), last menstrual period 12/24/2006, SpO2 98%. Body mass index is 27.28 kg/m.  General: she is overweight, cooperative and in no  acute distress. PSYCH: Has normal mood, affect and thought process.   HEENT: EOMI, sclerae are anicteric. Lungs: Normal breathing effort, no conversational dyspnea. Extremities: Moves * 4 Neurologic: A and O * 3, good insight  DIAGNOSTIC DATA REVIEWED: BMET    Component Value Date/Time   NA 139 04/21/2024 2310   NA 143 01/20/2024 0953   K 4.0 04/21/2024 2310   CL 103 04/21/2024 2310   CO2 25 04/21/2024 2310   GLUCOSE 90 04/21/2024 2310   BUN 26 (H) 04/21/2024 2310   BUN 13 01/20/2024 0953   CREATININE 0.77 04/21/2024 2310   CALCIUM  10.1 04/21/2024 2310   GFRNONAA >60 04/21/2024 2310   GFRAA >60 11/15/2017 2142   Lab Results  Component Value Date   HGBA1C 5.5 01/20/2024   HGBA1C 5.6 10/17/2015  Lab Results  Component Value Date   INSULIN  11.2 01/20/2024   Lab Results  Component Value Date   TSH 2.190 01/20/2024   CBC    Component Value Date/Time   WBC 6.7 04/21/2024 2310   RBC 4.50 04/21/2024 2310   HGB 12.9 04/21/2024 2310   HGB 14.3 01/20/2024 0953   HCT 38.7 04/21/2024 2310   HCT 42.3 01/20/2024 0953   PLT 210 04/21/2024 2310   PLT 253 01/20/2024 0953   MCV 86.0 04/21/2024 2310   MCV 87 01/20/2024 0953   MCH 28.7 04/21/2024 2310   MCHC 33.3 04/21/2024 2310   RDW 13.2 04/21/2024 2310   RDW 12.8 01/20/2024 0953   Iron Studies    Component Value Date/Time   IRON 89 11/14/2022 1321   TIBC 326.2 11/14/2022 1321   FERRITIN 87.8 11/14/2022 1321   IRONPCTSAT 27.3 11/14/2022 1321   Lipid Panel     Component Value Date/Time   CHOL 213 (H) 01/20/2024 0953   TRIG 100 01/20/2024 0953   HDL 54 01/20/2024 0953   CHOLHDL 3 09/04/2023 0837   VLDL 17.0 09/04/2023 0837   LDLCALC 141 (H) 01/20/2024 0953   LDLDIRECT 118.0 12/28/2019 1323   Hepatic Function Panel     Component Value Date/Time   PROT 7.0 04/21/2024 2310   PROT 7.1 01/20/2024 0953   ALBUMIN 4.4 04/21/2024 2310   ALBUMIN 4.5 01/20/2024 0953   AST 18 04/21/2024 2310   ALT 13 04/21/2024 2310    ALKPHOS 86 04/21/2024 2310   BILITOT 0.3 04/21/2024 2310   BILITOT 0.3 01/20/2024 0953   BILIDIR 0.1 09/04/2023 0837      Component Value Date/Time   TSH 2.190 01/20/2024 0953   Nutritional Lab Results  Component Value Date   VD25OH 33.4 01/20/2024   VD25OH 37.16 04/04/2020   VD25OH 28.91 (L) 01/26/2020    Attestations:   Cherryl Corona, acting as a medical scribe for Marceil Sensor, DO., have compiled all relevant documentation for today's office visit on behalf of Marceil Sensor, DO, while in the presence of [employer], DO.   Reviewed by clinician on day of visit: allergies, medications, problem list, medical history, surgical history, family history, social history, and previous encounter notes pertinent to patient's obesity diagnosis.  I have reviewed the above documentation for accuracy and completeness, and I agree with the above. [name], D.O.  The 21st Century Cures Act was signed into law in 2016 which includes the topic of electronic health records.  This provides immediate access to information in MyChart.  This includes consultation notes, operative notes, office notes, lab results and pathology reports.  If you have any questions about what you read please let us  know at your next visit so we can discuss your concerns and take corrective action if need be.  We are right here with you.

## 2024-05-07 ENCOUNTER — Ambulatory Visit (HOSPITAL_COMMUNITY)
Admission: RE | Admit: 2024-05-07 | Discharge: 2024-05-07 | Disposition: A | Discharge status: Home / Self Care | Source: Ambulatory Visit | Attending: Cardiovascular Disease | Admitting: Cardiovascular Disease

## 2024-05-07 DIAGNOSIS — R072 Precordial pain: Secondary | ICD-10-CM | POA: Insufficient documentation

## 2024-05-07 LAB — HEMOGLOBIN A1C
Est. average glucose Bld gHb Est-mCnc: 117 mg/dL
Hgb A1c MFr Bld: 5.7 % — ABNORMAL HIGH (ref 4.8–5.6)

## 2024-05-07 LAB — INSULIN, RANDOM: INSULIN: 4.8 u[IU]/mL (ref 2.6–24.9)

## 2024-05-07 LAB — COMPREHENSIVE METABOLIC PANEL WITH GFR
ALT: 18 IU/L (ref 0–32)
AST: 15 IU/L (ref 0–40)
Albumin: 4.3 g/dL (ref 3.8–4.9)
Alkaline Phosphatase: 106 IU/L (ref 44–121)
BUN/Creatinine Ratio: 20 (ref 9–23)
BUN: 17 mg/dL (ref 6–24)
Bilirubin Total: 0.4 mg/dL (ref 0.0–1.2)
CO2: 23 mmol/L (ref 20–29)
Calcium: 9.5 mg/dL (ref 8.7–10.2)
Chloride: 103 mmol/L (ref 96–106)
Creatinine, Ser: 0.83 mg/dL (ref 0.57–1.00)
Globulin, Total: 2.4 g/dL (ref 1.5–4.5)
Glucose: 83 mg/dL (ref 70–99)
Potassium: 4.2 mmol/L (ref 3.5–5.2)
Sodium: 141 mmol/L (ref 134–144)
Total Protein: 6.7 g/dL (ref 6.0–8.5)
eGFR: 82 mL/min/{1.73_m2} (ref >59)

## 2024-05-07 LAB — ECHOCARDIOGRAM COMPLETE
Area-P 1/2: 3.93 cm2
S' Lateral: 2.9 cm

## 2024-05-07 LAB — LIPID PANEL
Chol/HDL Ratio: 2.5 ratio (ref 0.0–4.4)
Cholesterol, Total: 119 mg/dL (ref 100–199)
HDL: 48 mg/dL (ref >39)
LDL Chol Calc (NIH): 59 mg/dL (ref 0–99)
Triglycerides: 53 mg/dL (ref 0–149)
VLDL Cholesterol Cal: 12 mg/dL (ref 5–40)

## 2024-05-07 LAB — VITAMIN D 25 HYDROXY (VIT D DEFICIENCY, FRACTURES): Vit D, 25-Hydroxy: 70.3 ng/mL (ref 30.0–100.0)

## 2024-05-07 LAB — VITAMIN B12: Vitamin B-12: 646 pg/mL (ref 232–1245)

## 2024-05-10 ENCOUNTER — Ambulatory Visit: Payer: Self-pay | Admitting: Cardiology

## 2024-05-21 ENCOUNTER — Encounter (INDEPENDENT_AMBULATORY_CARE_PROVIDER_SITE_OTHER): Payer: Self-pay | Admitting: Adult Health

## 2024-05-21 ENCOUNTER — Ambulatory Visit (INDEPENDENT_AMBULATORY_CARE_PROVIDER_SITE_OTHER): Admitting: Adult Health

## 2024-05-21 VITALS — BP 115/69 | HR 71 | Temp 98.0°F | Ht 66.0 in | Wt 168.0 lb

## 2024-05-21 DIAGNOSIS — R7303 Prediabetes: Secondary | ICD-10-CM

## 2024-05-21 DIAGNOSIS — F5089 Other specified eating disorder: Secondary | ICD-10-CM

## 2024-05-21 DIAGNOSIS — E538 Deficiency of other specified B group vitamins: Secondary | ICD-10-CM

## 2024-05-21 DIAGNOSIS — E559 Vitamin D deficiency, unspecified: Secondary | ICD-10-CM

## 2024-05-21 DIAGNOSIS — E669 Obesity, unspecified: Secondary | ICD-10-CM

## 2024-05-21 DIAGNOSIS — E782 Mixed hyperlipidemia: Secondary | ICD-10-CM

## 2024-05-21 DIAGNOSIS — Z6827 Body mass index (BMI) 27.0-27.9, adult: Secondary | ICD-10-CM

## 2024-05-21 MED ORDER — VITAMIN D (ERGOCALCIFEROL) 1.25 MG (50000 UNIT) PO CAPS
50000.0000 [IU] | ORAL_CAPSULE | ORAL | 1 refills | Status: DC
Start: 2024-05-21 — End: 2024-05-21

## 2024-05-21 MED ORDER — VITAMIN D (ERGOCALCIFEROL) 1.25 MG (50000 UNIT) PO CAPS
50000.0000 [IU] | ORAL_CAPSULE | ORAL | 0 refills | Status: DC
Start: 2024-05-21 — End: 2024-05-21

## 2024-05-21 MED ORDER — VITAMIN D (ERGOCALCIFEROL) 1.25 MG (50000 UNIT) PO CAPS: 50000.0000 [IU] | ORAL_CAPSULE | ORAL | 0 refills | Status: DC

## 2024-05-21 NOTE — Progress Notes (Signed)
 WEIGHT SUMMARY AND BIOMETRICS  Vitals Temp: 98 F (36.7 C) BP: 115/69 Pulse Rate: 71 SpO2: 92 %   Anthropometric Measurements Height: 5\' 6"  (1.676 m) Weight: 168 lb (76.2 kg) BMI (Calculated): 27.13 Weight at Last Visit: 169 lb Weight Lost Since Last Visit: 1 lb Weight Gained Since Last Visit: 0 Starting Weight: 189 lb Total Weight Loss (lbs): 21 lb (9.526 kg) Peak Weight: 250 lb Waist Measurement : 36.5 inches   Body Composition  Body Fat %: 38 % Fat Mass (lbs): 63.8 lbs Muscle Mass (lbs): 99 lbs Total Body Water  (lbs): 67.8 lbs Visceral Fat Rating : 9   Other Clinical Data Fasting: no Labs: no Today's Visit #: 8 Starting Date: 01/20/24    Chief Complaint:   OBESITY [name] is here to discuss her progress with her obesity treatment plan.  She is on the the Category 1 Plan and states she is following her eating plan approximately 75-90 % of the time.  She states she is exercising Walking/Strength Training 20-30 minutes 4/2 times per week.  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  Current weight 168  lbs Goal weight 150 lbs  Subjective:   1. Vitamin D  deficiency Discussed Labs  Latest Reference Range & Units 05/05/24 09:15  Vitamin D , 25-Hydroxy 30.0 - 100.0 ng/mL 70.3       Vit D Level at goal She is on weekly Ergocalciferol - denies N/V/Muscle Weakness  2. Mixed hyperlipidemia Discussed Labs Lipid Panel     Component Value Date/Time   CHOL 119 05/05/2024 0915   TRIG 53 05/05/2024 0915   HDL 48 05/05/2024 0915   CHOLHDL 2.5 05/05/2024 0915   CHOLHDL 3 09/04/2023 0837   VLDL 17.0 09/04/2023 0837   LDLCALC 59 05/05/2024 0915   LDLDIRECT 118.0 12/28/2019 1323   LABVLDL 12 05/05/2024 0915    09/06/7828 CMP: :Liver enzymes normal Lipid panel at  [facility]!  03/27/2024 Cardiology OV Notes: PLAN:     CAD: Calcium  score on 02/10/2024 was 843 (99th percentile).  Reports chest pain/dyspnea occurs when she is stressed, suggesting possible angina. -Continue aspirin  81 mg daily -Continue atorvastatin , will increase to 40 mg daily -Stress PET to evaluate for ischemia -Echocardiogram Duralde structural heart disease -Will prescribe as needed sublingual nitroglycerin  until obtain stress test.  Discussed that if having chest pain that does not resolve after 5 minutes, can take nitroglycerin ; if does not resolve after another 5 minutes can take a second nitroglycerin  but if not resolving after 2 nitroglycerin , should go to ED   Hyperlipidemia: LDL 141 on 01/20/2024.  Started on atorvastatin  20 mg, recommend increasing to 40 mg daily   OSA: recently diagnosed, starting on CPAP   3. Prediabetes Discussed Labs  Latest Reference Range & Units 05/05/24 09:15  Glucose 70 - 99 mg/dL 83  Hemoglobin F6O 4.8 - 5.6 % 5.7 (H)  Est. average glucose Bld gHb Est-mCnc mg/dL 130  INSULIN  2.6 - 24.9 uIU/mL 4.8  (H): Data is abnormally high  CBG at goal Insulin  level improved and now at goal A1c slightly increased to prediabetic level She is not on any antidiabetic therapy  4. Vitamin B12 insufficeny Discussed Labs Vitamin B12 232 - 1,245 pg/mL 646   B12 level is at goal She is on oral daily B12 500mcg  5. Other Specified Feeding or Eating Disorder, Emotional Eating Behaviors 05/05/2024 she was restarted on Wellbutrin  SR 100mg  BID She Abreanna SE and endorses decreased cravings. She denies hx of seizures and is post menopausal   Assessment/Plan:   1. Vitamin D  deficiency (Primary) Refill and DECREASE Vitamin D , Ergocalciferol , (DRISDOL ) 1.25 MG (50000 UNIT) CAPS capsule Take 1 capsule (50,000 Units total) by mouth every 14 (fourteen) days. Dispense: 5 capsule, Refills: 0 ordered   2. Mixed hyperlipidemia Continue to limit sat fat Continue regular  exercise Continue current statin therapy  3. Prediabetes Reduce simple CHO/sugar Continue to increase protein  Continue regular exercise  4. Vitamin B12 insufficeny Continue current oral supplementation  5. Other Specified Feeding or Eating Disorder, Emotional Eating Behaviors Continue Wellbutrin  SR 100mg  BID- does not needs refill today  6. BMI 27.0-27.9,adult - Current BMI 27.1  Netta is currently in the action stage of change. As such, her goal is to continue with weight loss efforts. She has agreed to the Category 1 Plan.   Exercise goals: For substantial health benefits, adults should do at least 150 minutes (2 hours and 30 minutes) a week of moderate-intensity, or 75 minutes (1 hour and 15 minutes) a week of vigorous-intensity aerobic physical activity, or an equivalent combination of moderate- and vigorous-intensity aerobic activity. Aerobic activity should be performed in episodes of at least 10 minutes, and preferably, it should be spread throughout the week.  Behavioral modification strategies: increasing lean protein intake, decreasing simple carbohydrates, increasing vegetables, increasing water  intake, no skipping meals, meal planning and cooking strategies, keeping healthy foods in the home, ways to avoid boredom eating, and planning for success.  Maleyah has agreed to follow-up with our clinic in 4 weeks. She was informed of the importance of frequent follow-up visits to maximize her success with intensive lifestyle modifications for her multiple health conditions.   Objective:   Blood pressure 115/69, pulse 71, temperature 98 F (36.7 C), height 5\' 6"  (1.676 m), weight 168 lb (76.2 kg), last menstrual period 12/24/2006, SpO2 92%. Body mass index is 27.12 kg/m.  General: Cooperative, alert, well developed, in no acute distress. HEENT: Conjunctivae and lids unremarkable. Cardiovascular: Regular rhythm.  Lungs: Normal work of breathing. Neurologic: No focal deficits.    Lab Results  Component Value Date   CREATININE 0.83 05/05/2024   BUN 17 05/05/2024   NA 141 05/05/2024   K 4.2 05/05/2024   CL 103 05/05/2024   CO2 23 05/05/2024   Lab Results  Component Value Date   ALT 18 05/05/2024   AST 15 05/05/2024   ALKPHOS 106 05/05/2024   BILITOT 0.4 05/05/2024   Lab Results  Component Value Date   HGBA1C 5.7 (H) 05/05/2024   HGBA1C 5.5 01/20/2024   HGBA1C 5.7 09/04/2023   HGBA1C 5.6 01/21/2023   HGBA1C 5.7 07/13/2022   Lab Results  Component Value Date   INSULIN  4.8 05/05/2024   INSULIN  11.2 01/20/2024   Lab Results  Component Value Date   TSH 2.190 01/20/2024   Lab Results  Component Value Date   CHOL 119 05/05/2024   HDL 48 05/05/2024   LDLCALC 59 05/05/2024   LDLDIRECT 118.0 12/28/2019   TRIG 53 05/05/2024   CHOLHDL 2.5 05/05/2024   Lab Results  Component Value Date   VD25OH 70.3 05/05/2024   VD25OH 33.4 01/20/2024   VD25OH 37.16 04/04/2020   Lab Results  Component Value Date   WBC 6.7 04/21/2024   HGB 12.9 04/21/2024   HCT 38.7 04/21/2024  MCV 86.0 04/21/2024   PLT 210 04/21/2024   Lab Results  Component Value Date   IRON 89 11/14/2022   TIBC 326.2 11/14/2022   FERRITIN 87.8 11/14/2022   Attestation Statements:   Reviewed by clinician on day of visit: allergies, medications, problem list, medical history, surgical history, family history, social history, and previous encounter notes.  I have reviewed the above documentation for accuracy and completeness, and I agree with the above. -  Shamaria Kavan d. Alyxis Grippi, NP-C

## 2024-05-25 ENCOUNTER — Encounter: Payer: Self-pay | Admitting: Sleep Medicine

## 2024-05-25 ENCOUNTER — Telehealth (INDEPENDENT_AMBULATORY_CARE_PROVIDER_SITE_OTHER): Admitting: Psychology

## 2024-05-25 ENCOUNTER — Ambulatory Visit (INDEPENDENT_AMBULATORY_CARE_PROVIDER_SITE_OTHER): Admitting: Sleep Medicine

## 2024-05-25 VITALS — BP 90/60 | HR 61 | Temp 97.9°F | Ht 66.0 in | Wt 171.4 lb

## 2024-05-25 DIAGNOSIS — Z87891 Personal history of nicotine dependence: Secondary | ICD-10-CM

## 2024-05-25 DIAGNOSIS — F5089 Other specified eating disorder: Secondary | ICD-10-CM | POA: Diagnosis not present

## 2024-05-25 DIAGNOSIS — G4733 Obstructive sleep apnea (adult) (pediatric): Secondary | ICD-10-CM | POA: Diagnosis not present

## 2024-05-25 DIAGNOSIS — F419 Anxiety disorder, unspecified: Secondary | ICD-10-CM

## 2024-05-25 DIAGNOSIS — G4700 Insomnia, unspecified: Secondary | ICD-10-CM

## 2024-05-25 DIAGNOSIS — F5104 Psychophysiologic insomnia: Secondary | ICD-10-CM

## 2024-05-25 MED ORDER — TRAZODONE HCL 50 MG PO TABS: 50.0000 mg | ORAL_TABLET | Freq: Every day | ORAL | 1 refills | Status: DC

## 2024-05-25 NOTE — Patient Instructions (Signed)
 Patient Instructions  Continue to use CPAP every night, minimum of 7-8 hours a night.  Change equipment every 30 days or as directed by DME.  Wash your tubing with warm soap and water daily, hang to dry. Wash humidifier portion weekly. Use distilled water and change daily.  Be aware of reduced alertness and do not drive or operate heavy machinery if experiencing this or drowsiness.  Exercise encouraged, as tolerated. Encouraged proper weight management.  Important to get eight or more hours of sleep  Limiting the use of the computer and television before bedtime.  Decrease naps during the day, so night time sleep will become enhanced.  Limit caffeine, and sleep deprivation.  HTN, stroke, uncontrolled diabetes and heart failure are potential risk factors.  Risk of untreated sleep apnea including cardiac arrhthymias, stroke, DM, pulm HTN.

## 2024-05-25 NOTE — Progress Notes (Signed)
 Name:[name] MRN: [id number] DOB: 1966-07-13   CHIEF COMPLAINT:  HST F/U   HISTORY OF PRESENT ILLNESS:  [name] is a 58 y.o. w/ a h/o OSA, ADHD and RLS who presents for CPAP follow up visit. Reports using CPAP therapy every night which is confirmed by compliance data. She is currently using the Airfit F40 FFM, which is uncomfortable. States that she is unable to sleep through the night with CPAP therapy due to pressure discomfort.    EPWORTH SLEEP SCORE     02/14/2024    8:38 AM  Results of the Epworth flowsheet  Sitting and reading 2  Watching TV 2  Sitting, inactive in a public place (e.g. a theatre or a meeting) 0  As a passenger in a car for an hour without a break 3  Lying down to rest in the afternoon when circumstances permit 1  Sitting and talking to someone 0  Sitting quietly after a lunch without alcohol 0  In a car, while stopped for a few minutes in traffic 0  Total score 8    PAST MEDICAL HISTORY :   has a past medical history of ADHD, Allergy, Anxiety, Back pain, CAD (coronary artery disease), Constipated, Depression, Fatty liver, GERD (gastroesophageal reflux disease), History of abnormal cervical Pap smear, History of colon polyps, Hydronephrosis, left, Hyperlipidemia (2024), Kidney stones (2016), OSA (obstructive sleep apnea), PONV (postoperative nausea and vomiting), RLS (restless legs syndrome), Rosacea, and Vitamin D  deficiency.  has a past surgical history that includes Laparoscopic cholecystectomy (1999); Tubal ligation (Bilateral, 1995); Cystoscopy w/ ureteral stent placement (01/1999); Extracorporeal shock wave lithotripsy (2001  &  2002); DX LAPAROSCOPY (X2); EXPLORATORY LAPAROTOMY W/ BILATERAL SALPINGECTOMY AND REMOVAL RIGHT ECTOPIC PREG. (02/23/2004); TOTAL ABDOMINAL HYSTERECTOMY W/ BILATERAL OOPHORECTOMY AND LYSIS ADHESIONS (09/02/2007); Esophagogastroduodenoscopy (05/09/2007); Umbilical hernia repair (2003); Shoulder arthroscopy  with open rotator cuff repair (Right, 2012); Cystoscopy with ureteroscopy and stent placement (Left, 05/18/2015); Cystoscopy with holmium laser lithotripsy (Left, 05/18/2015); Cystoscopy with retrograde pyelogram, ureteroscopy and stent placement (Left, 06/22/2015); Cystoscopy w/ retrogrades (Bilateral, 06/22/2015); Abdominal hysterectomy; Diagnostic laparoscopy; Anterior and posterior repair with sacrospinous fixation (N/A, 08/16/2015); Bladder suspension (N/A, 08/16/2015); Cystoscopy (N/A, 08/16/2015); Vaginal hysterectomy (N/A, 08/18/2015); Polypectomy; Colonoscopy (08/21/2017); Robotic assisted laparoscopic sacrocolpopexy (N/A, 12/12/2022); Cystoscopy (N/A, 12/12/2022); Cystocele repair (N/A, 12/12/2022); and Cholecystectomy (1998). Prior to Admission medications   Medication Sig Start Date End Date Taking? Authorizing Provider  ALPRAZolam  (XANAX ) 1 MG tablet Take 1 tablet (1 mg total) by mouth 2 (two) times daily as needed for anxiety. 07/03/23  Yes Donley Furth, MD  aspirin  EC 81 MG tablet Take 1 tablet (81 mg total) by mouth daily. Swallow whole. 02/13/24  Yes Donley Furth, MD  atorvastatin  (LIPITOR) 40 MG tablet Take 40 mg by mouth daily.   Yes [provider]  buPROPion  ER (WELLBUTRIN  SR) 100 MG 12 hr tablet Take 1 tablet (100 mg total) by mouth 2 (two) times daily. 05/05/24  Yes Opalski, Bernardo Bridgeman, DO  cyanocobalamin  (VITAMIN B12) 500 MCG tablet 300 mcg- 500 mcg daily 02/03/24  Yes Opalski, Bernardo Bridgeman, DO  estradiol  (ESTRACE ) 0.1 MG/GM vaginal cream Place 0.5g nightly for two weeks then twice a week after 08/12/23  Yes Arma Lamp, MD  nitroGLYCERIN  (NITROSTAT ) 0.4 MG SL tablet Place 1 tablet (0.4 mg total) under the tongue every 5 (five) minutes as needed for chest pain. 3 doses max in a 24 hour period 03/27/24 06/25/24 Yes Wendie Hamburg, MD  pantoprazole  (PROTONIX )  20 MG tablet Take 1 tablet (20 mg total) by mouth daily. 04/22/24  Yes Delo, Dufm Gibbon, MD  Vitamin D ,  Ergocalciferol , (DRISDOL ) 1.25 MG (50000 UNIT) CAPS capsule Take 1 capsule (50,000 Units total) by mouth every 14 (fourteen) days. 05/21/24  Yes Danford, Barkley Li, NP  amphetamine -dextroamphetamine  (ADDERALL XR) 20 MG 24 hr capsule Take 1 capsule (20 mg total) by mouth every morning. 04/14/24   Donley Furth, MD  atorvastatin  (LIPITOR) 20 MG tablet Take 20 mg by mouth daily. 05/10/24   [provider]   Allergies  Allergen Reactions   Desvenlafaxine Other (See Comments)    Reaction:  Withdrawal    Prochlorperazine  Other (See Comments)    Altered mental status    FAMILY HISTORY:  family history includes Anxiety disorder in her brother; Arrhythmia in her father and mother; Breast cancer in her paternal grandmother; Cancer in her paternal uncle; Colon polyps in her father and mother; Depression in her mother; Diabetes in her father; Drug abuse in her sister; Heart attack in her maternal grandmother; Heart disease in her father and mother; Hypertension in her mother; Kidney disease in her mother; Obesity in her mother; Pancreatic cancer in her paternal uncle. SOCIAL HISTORY:  reports that she quit smoking about 16 years ago. Her smoking use included cigarettes. She started smoking about 31 years ago. She has never used smokeless tobacco. She reports that she does not drink alcohol and does not use drugs.   Review of Systems:  Gen:  Denies  fever, sweats, chills weight loss  HEENT: Denies blurred vision, double vision, ear pain, eye pain, hearing loss, nose bleeds, sore throat Cardiac:  No dizziness, chest pain or heaviness, chest tightness,edema, No JVD Resp:   No cough, -sputum production, -shortness of breath,-wheezing, -hemoptysis,  Gi: Denies swallowing difficulty, stomach pain, nausea or vomiting, diarrhea, constipation, bowel incontinence Gu:  Denies bladder incontinence, burning urine Ext:   Denies Joint pain, stiffness or swelling Skin: Denies  skin rash, easy bruising or bleeding  or hives Endoc:  Denies polyuria, polydipsia , polyphagia or weight change Psych:   Denies depression, insomnia or hallucinations  Other:  All other systems negative  VITAL SIGNS: BP 90/60 (BP Location: Right Arm, Patient Position: Sitting, Cuff Size: Normal)   Pulse 61   Temp 97.9 F (36.6 C) (Oral)   Ht 5\' 6"  (1.676 m)   Wt 171 lb 6.4 oz (77.7 kg)   LMP 12/24/2006 (Approximate)   SpO2 99%   BMI 27.66 kg/m    Physical Examination:   General Appearance: No distress  EYES PERRLA, EOM intact.   NECK Supple, No JVD Pulmonary: normal breath sounds, No wheezing.  CardiovascularNormal S1,S2.  No m/r/g.   Abdomen: Benign, Soft, non-tender. Skin:   warm, no rashes, no ecchymosis  Extremities: normal, no cyanosis, clubbing. Neuro:without focal findings,  speech normal  PSYCHIATRIC: Mood, affect within normal limits.   ASSESSMENT AND PLAN  OSA Patient is using and benefiting from CPAP therapy. For mask discomfort, will try patient on the Airfit N30i nasal mask. Will also decrease pressure range to 4-12 cm H2O. Discussed the consequences of untreated sleep apnea. Advised not to drive drowsy for safety of patient and others. Will follow up in 3 months.   Anxiety Stable, on current management. Following with PCP.   Insomnia Counseled patient on stimulus control and improving sleep hygiene practices. Will also try patient on Trazodone 50 mg at bedtime.    Patient  satisfied with Plan of action and  management. All questions answered  I spent a total of 31 minutes reviewing chart data, face-to-face evaluation with the patient, counseling and coordination of care as detailed above.    Dougles Kimmey, [name]  Sleep Medicine [facility]

## 2024-05-25 NOTE — Progress Notes (Signed)
  Office: [phone]  /  Fax: [phone]    Date: May 25, 2024  Appointment Start Time: 2:35pm Duration: 21 minutes Provider: Catherene Close, Psy.D. Type of Session: Individual Therapy  Location of Patient: Home (private location) Location of Provider: Provider's Home (private office) Type of Contact: Telepsychological Visit via MyChart Video Visit  Session Content: [name] is a 58 y.o. female presenting for a follow-up appointment to address the previously established treatment goal of increasing coping skills. Today's appointment was a telepsychological visit. [name] provided verbal consent for today's telepsychological appointment and she is aware she is responsible for securing confidentiality on her end of the session. Prior to proceeding with today's appointment, [name]'s physical location at the time of this appointment was obtained as well a phone number she could be reached at in the event of technical difficulties. [name] and this provider participated in today's telepsychological service.   This provider conducted a brief check-in. [name] shared about her recent appointment with [facility], adding she was prescribed Wellbutrin . Of note, [name] expressed concern regarding "overthinking" her eating habits. Further explored and processed. Reviewed triggers for emotional eating behaviors. She identified experiencing the following triggers: squelch urgency, out of habit, and stress. To assist with coping, psychoeducation provided regarding urge surfing. [name] provided verbal consent during today's appointment for this provider to send a handout about urge surfing via e-mail. Overall, [name] was receptive to today's appointment as evidenced by openness to sharing, responsiveness to feedback, and willingness to engage in urge surfing to cope with cravings.  Mental Status Examination:  Appearance: neat Behavior: appropriate to circumstances Mood: neutral Affect: mood congruent Speech: WNL Eye  Contact: appropriate Psychomotor Activity: WNL Gait: unable to assess Thought Process: linear, logical, and goal directed and no evidence or endorsement of suicidal, homicidal, and self-harm ideation, plan and intent  Thought Content/Perception: no hallucinations, delusions, bizarre thinking or behavior endorsed or observed Orientation: AAOx4 Memory/Concentration: intact Insight: good Judgment: good  Interventions:  Conducted a brief chart review Provided empathic reflections and validation Reviewed content from the previous session Provided positive reinforcement Employed supportive psychotherapy interventions to facilitate reduced distress and to improve coping skills with identified stressors Psychoeducation provided regarding urge surfing  DSM-5 Diagnosis(es): F50.89 Other Specified Feeding or Eating Disorder, Emotional Eating Behaviors and F41.9 Unspecified Anxiety Disorder  Treatment Goal & Progress: During the initial appointment with this provider, the following treatment goal was established: increase coping skills. [name] has demonstrated progress in her goal as evidenced by increased awareness of hunger patterns and increased awareness of triggers for emotional eating behaviors. Latrece also continues to demonstrate willingness to engage in learned skill(s).  Plan: The next appointment is scheduled for 06/16/2024 at 8am, which will be via MyChart Video Visit. The next session will focus on working towards the established treatment goal. [name] will continue with her primary therapist.    Catherene Close, PsyD

## 2024-05-26 ENCOUNTER — Ambulatory Visit (INDEPENDENT_AMBULATORY_CARE_PROVIDER_SITE_OTHER): Admitting: Psychology

## 2024-05-26 ENCOUNTER — Encounter (INDEPENDENT_AMBULATORY_CARE_PROVIDER_SITE_OTHER): Payer: Self-pay

## 2024-05-26 DIAGNOSIS — F411 Generalized anxiety disorder: Secondary | ICD-10-CM | POA: Diagnosis not present

## 2024-05-26 DIAGNOSIS — F4321 Adjustment disorder with depressed mood: Secondary | ICD-10-CM

## 2024-05-26 NOTE — Progress Notes (Signed)
 [facility] Counselor/Therapist Progress Note  Patient ID: [name], MRN: [id number],    Date: 05/26/24   Time Spent: 12:00pm-12:52pm  Treatment Type: Individual Therapy  Pt is seen for a virtual video visit via caregility. Pt consents to virtual visit and is aware of limitations of virtual visits.  Pt joins from her home, reporting privacy, and counselor from her home office.     Reported Symptoms: pt reports stress and anxiety- feeling overwhelmed w/ multiple roles.    Mental Status Exam: Appearance:  Well Groomed     Behavior: Appropriate  Motor: Normal  Speech/Language:  Normal Rate  Affect: Appropriate  Mood: anxious  Thought process: normal  Thought content:   WNL  Sensory/Perceptual disturbances:   WNL  Orientation: oriented to person, place, time/date, and situation  Attention: Good  Concentration: Good  Memory: WNL  Fund of knowledge:  Good  Insight:   Good  Judgment:  Good  Impulse Control: Good   Risk Assessment: Danger to Self:  No Self-injurious Behavior: No Danger to Others: No Duty to Warn:no Physical Aggression / Violence:No  Access to Firearms a concern: No  Gang Involvement:No   Subjective: Counselor assessed pt current functioning per pt report.  Processed w/pt stressors, positives and emotions.  Explored positive interactions and communication w/ husband.  Discussed multiple roles and realistic expectations for self.  Discussed decision re: work, pt values and information pt collecting to make decision.   Pt affect wnl.  Pt reports positive interactions w/ husband and no major conflicts/escalations.  Pt reports that she is looking forward to mom and brother's visit next month.  Pt reports grieving dad and recent urge to talk to him.  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  Pt identified that aware not possible to do job wants at all of  these and beginning to consider if possible to resign from job this year.  Pt discussed supports and needing to figure out health insurance options.  Pt reports continues to f/u w/ providers for heart, weight loss and sleep apnea.  Pt seeing improvements w/ health overall.  Pt still struggling w/ sleep.       Interventions: Cognitive Behavioral Therapy, Mindfulness Meditation, and supportive  Diagnosis:Generalized anxiety disorder  Grief  Plan: Pt to f/u w/ Counseling in 2 weeks.  Pt to f/u w/ PCP, Cardiologist and [facility] as scheduled.   Individualized Treatment Plan Strengths: enjoys walking and movement, enjoys the beach, family is important to her  Supports: her husband, her daughter in law and her mom    Goal/Needs for Treatment:  In order of importance to patient 1) cope with anxiety 2) increase self worth and decrease negative thought patterns.  3) continue cope through losses       Client Statement of Needs:   "talk about my grief, continue to work through my anxiety, try not be so negative on stuff and thinking things are going to go bad or wrong.  Finding time for self again."        Treatment Level: outpatient counseling  Symptoms:anxiety, worry, ruminating thoughts, low self worth, emotional escalations, depressed mood, sleep disturbance  Client Treatment Preferences: counseling every 2-4 weeks and continue medication management w/ PCP    Healthcare consumer's goal for treatment:   Counselor, [name], [facility] will support the patient's ability to achieve the goals identified. Cognitive Behavioral  Therapy, Assertive Communication/Conflict Resolution Training, Relaxation Training, ACT, Humanistic and other evidenced-based practices will be used to promote progress towards healthy functioning.    Healthcare consumer will: Actively participate in therapy, working towards healthy functioning.     *Justification for Continuation/Discontinuation of Goal:  R=Revised, O=Ongoing, A=Achieved, D=Discontinued   Goal 1) Pt will increase coping with life's anxiety and stressors AEB decreased ruminating worry, decreased emotional escalations, decreased negative thought patterns. That contribute to anxiety. Baseline date 03/10/24: Progress towards goal 40%; How Often - Daily Target Date Goal Was reviewed Status Code Progress towards goal  03/10/25                            Goal 2)  Increase self awareness, consistency w/ self care time and self compassion statements AEB pt report and therapist observation.   Baseline date 03/10/24: Progress towards goal 30; How Often - Daily Target Date Goal Was reviewed Status Code Progress towards goal/Likert rating  03/10/25                            Goal 3) Verbalize feelings of grief related to losses and recognize ways of living with grief present.  Baseline date 03/10/24: Progress towards goal 30; How Often - Daily Target Date Goal Was reviewed Status Code Progress towards goal/Likert rating  03/10/25                            This plan has been reviewed and created by the following participants:  This plan will be reviewed at least every 12 months. Date Behavioral Health Clinician Date Guardian/Patient   03/10/24 [facility] [name] [facility]                      03/10/24 Verbal consent provided and request for electronic signature sent and obtained               [name] [facility]

## 2024-06-09 ENCOUNTER — Ambulatory Visit: Admitting: Psychology

## 2024-06-10 ENCOUNTER — Encounter (INDEPENDENT_AMBULATORY_CARE_PROVIDER_SITE_OTHER): Payer: Self-pay | Admitting: Family Medicine

## 2024-06-10 ENCOUNTER — Ambulatory Visit (INDEPENDENT_AMBULATORY_CARE_PROVIDER_SITE_OTHER): Admitting: Family Medicine

## 2024-06-10 VITALS — BP 117/74 | HR 57 | Temp 97.9°F | Ht 66.0 in | Wt 168.0 lb

## 2024-06-10 DIAGNOSIS — E559 Vitamin D deficiency, unspecified: Secondary | ICD-10-CM

## 2024-06-10 DIAGNOSIS — R7303 Prediabetes: Secondary | ICD-10-CM | POA: Diagnosis not present

## 2024-06-10 DIAGNOSIS — F5089 Other specified eating disorder: Secondary | ICD-10-CM | POA: Diagnosis not present

## 2024-06-10 DIAGNOSIS — E669 Obesity, unspecified: Secondary | ICD-10-CM

## 2024-06-10 DIAGNOSIS — E782 Mixed hyperlipidemia: Secondary | ICD-10-CM

## 2024-06-10 DIAGNOSIS — E538 Deficiency of other specified B group vitamins: Secondary | ICD-10-CM

## 2024-06-10 DIAGNOSIS — Z6827 Body mass index (BMI) 27.0-27.9, adult: Secondary | ICD-10-CM

## 2024-06-10 MED ORDER — BUPROPION HCL ER (SR) 100 MG PO TB12: 100.0000 mg | ORAL_TABLET | Freq: Two times a day (BID) | ORAL | 0 refills | Status: DC

## 2024-06-10 NOTE — Progress Notes (Signed)
 [name], D.O.  ABFM, ABOM Specializing in Clinical Bariatric Medicine  Office located at: [address] [place], [place]  [address]   Assessment and Plan:  No orders of the defined types were placed in this encounter.  Medications Discontinued During This Encounter  Medication Reason   buPROPion  ER (WELLBUTRIN  SR) 100 MG 12 hr tablet Reorder    Meds ordered this encounter  Medications   buPROPion  ER (WELLBUTRIN  SR) 100 MG 12 hr tablet    Sig: Take 1 tablet (100 mg total) by mouth 2 (two) times daily.    Dispense:  60 tablet    Refill:  0      FOR THE DISEASE OF OBESITY: BMI 27.0-27.9,adult -- Current BMI 27.13 Obesity (BMI 30-39.9) - Starting BMI 30.52 Assessment & Plan: Since last office visit on 05/21/24 patient's muscle mass has increased by 19.4 lbs. Fat mass has decreased by 20.4 lbs. Total body water  has increased by 6.4 lbs.  Counseling done on how various foods will affect these numbers and how to maximize success.   Biometric information above was done twice. From a clinical standpoint it is doubtful these numbers are correct.   Total lbs lost to date: 21 lbs Total weight loss percentage to date: -11.11%    Recommended Dietary Goals [name] is currently in the action stage of change. As such, her goal is to continue weight management plan.  She has agreed to: continue current plan   Behavioral Intervention We discussed the following today: increasing lean protein intake to established goals, increasing water  intake , and continue to work on maintaining a reduced calorie state, getting the recommended amount of protein, incorporating whole foods, making healthy choices, staying well hydrated and practicing mindfulness when eating.  Additional resources provided today: None  Evidence-based interventions for health behavior change were utilized today including the discussion of self monitoring techniques, problem-solving barriers and SMART goal  setting techniques.   Regarding patient's less desirable eating habits and patterns, we employed the technique of small changes.   Pt will specifically work on: n/a   Recommended Physical Activity Goals [name] has been advised to work up to 300-450 minutes of moderate intensity aerobic activity a week and strengthening exercises 2-3 times per week for cardiovascular health, weight loss maintenance and preservation of muscle mass.   She has agreed to: Continue current level of physical activity  and Increase physical activity in their day and reduce sedentary time (increase NEAT).   Pharmacotherapy We both agreed to : Continue with current nutritional and behavioral strategies   ASSOCIATED CONDITIONS ADDRESSED TODAY: Prediabetes Assessment & Plan: Lab Results  Component Value Date   HGBA1C 5.7 (H) 05/05/2024   HGBA1C 5.5 01/20/2024   HGBA1C 5.7 09/04/2023   INSULIN  4.8 05/05/2024   INSULIN  11.2 01/20/2024    Labs obtained at prior  OV and reviewed with Brittany Dalton NP at L[facility]. Pt requests we review labs today as well. A1c above goal at 5.7 and increased from prior, insulin  is at goal., and kidney function and liver enzymes are at goal. No meds currently; diet/exercise approach. She is drinking 3-4 refills of her 40 oz bottle per day. Reviewed insulin  goal of less than 5. Continue working on healthy, eating habits and exercise/increasing NEAT. Continue monitoring.   Mixed hyperlipidemia Assessment & Plan: Lab Results  Component Value Date   CHOL 119 05/05/2024   HDL 48 05/05/2024   LDLCALC 59 05/05/2024   LDLDIRECT 118.0 12/28/2019   TRIG  53 05/05/2024   CHOLHDL 2.5 05/05/2024   Labs obtained at prior  OV and reviewed with Brittany Dalton NP at [facility]. Pt requests we review labs today as well.Lipid panel cholesterol components were grossly normal. Compliant with Lipitor 40 mg once daily. Tolerance, no side effects reported. Reviewed how HDL is expected to improve with exercise and  heart healthy diet per nutritional meal plan. Encouraged patient to decrease trans/saturated fats. Continue current med regimen as prescribed.    Other Specified Feeding or Eating Disorder, Emotional Eating Behaviors Assessment & Plan: Compliant with Wellbutrin  SR BID, Adderall XR 20 mg daily, and Xanax  1 mg BID PRN. Tolerating current med regimen well, no SE. Reviewed stress management strategies, including meditation and exercise. Encouraged her to continue increasing NEAT. Continue med regimen, refilling Wellbutrin  today.  Orders: -  Refill Wellbutrin , no change changes   Vitamin D  deficiency Assessment & Plan: Lab Results  Component Value Date   VD25OH 70.3 05/05/2024   VD25OH 33.4 01/20/2024   VD25OH 37.16 04/04/2020   Compliant with ERGO 50 K units once every 14 days. Tolerating well, no SE. Reviewed gold of 50-70. Continue supplementation at current dose. No refill required today.   Vitamin B12 insufficeny- new onset Assessment & Plan: Lab Results  Component Value Date   VITAMINB12 646 05/05/2024   VITAMINB12 411 01/20/2024   VITAMINB12 344 01/10/2016   Compliant with vitamin D  supplementation 500 mcg daily. Tolerating overall well, no side effects. Continue self limitation at current dose. No refill required today.   Follow up:   Return in about 2 weeks (around 06/24/2024). She was informed of the importance of frequent follow up visits to maximize her success with intensive lifestyle modifications for her multiple health conditions.  Subjective:   Chief complaint: Obesity [name] is here to discuss her progress with her obesity treatment plan. She is on the Category 1 Plan and states she is following her eating plan approximately 50% of the time. She states she is walking 20 minutes 2 days per week.  Interval History:  [name] is here for a follow up office visit. Since last OV with Brittany Dalton NP on 05/21/24, she has maintained weight. She has  prioritized her protein intake and eats chicken bowls with vegetables and cheese.   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 She is increasing NEAT by lifting heavy meats, soda cases, and is constantly walking while working. Her clothes have been fitting better on her and she has had to buy new clothes.  Pharmacotherapy for weight loss: She is currently taking no anti-obesity medication.   Review of Systems:  Pertinent positives were addressed with patient today.  Reviewed by clinician on day of visit: allergies, medications, problem list, medical history, surgical history, family history, social history, and previous encounter notes.  Weight Summary and Biometrics   Weight Lost Since Last Visit: 0lb  Weight Gained Since Last Visit: 0lb    Vitals Temp: 97.9 F (36.6 C) BP: 117/74 Pulse Rate: (!) 57 SpO2: 98 %   Anthropometric Measurements Height: 5' 6 (1.676 m) Weight: 168 lb (76.2 kg) BMI (Calculated): 27.13 Weight at Last Visit: 168lb Weight Lost Since Last Visit: 0lb Weight Gained Since Last Visit: 0lb Starting Weight: 189lb Total Weight Loss (lbs): 21 lb (9.526 kg) Peak Weight: 250lb Waist Measurement : 36.5 inches   Body Composition  Body Fat %: 25.8 % Fat Mass (lbs): 43.4 lbs Muscle Mass (lbs): 118.4 lbs Total Body Water  (  lbs): 74.2 lbs Visceral Fat Rating : 6   Other Clinical Data Fasting: No Labs: No Today's Visit #: 9 Starting Date: 01/20/24    Objective:   PHYSICAL EXAM: Blood pressure 117/74, pulse (!) 57, temperature 97.9 F (36.6 C), height 5' 6 (1.676 m), weight 168 lb (76.2 kg), last menstrual period 12/24/2006, SpO2 98%. Body mass index is 27.12 kg/m.  General: she is overweight, cooperative and in no acute distress. PSYCH: Has normal mood, affect and thought process.   HEENT: EOMI, sclerae are anicteric. Lungs: Normal breathing effort, no conversational dyspnea. Extremities: Moves * 4 Neurologic: A and O * 3,  good insight  DIAGNOSTIC DATA REVIEWED: BMET    Component Value Date/Time   NA 141 05/05/2024 0915   K 4.2 05/05/2024 0915   CL 103 05/05/2024 0915   CO2 23 05/05/2024 0915   GLUCOSE 83 05/05/2024 0915   GLUCOSE 90 04/21/2024 2310   BUN 17 05/05/2024 0915   CREATININE 0.83 05/05/2024 0915   CALCIUM  9.5 05/05/2024 0915   GFRNONAA >60 04/21/2024 2310   GFRAA >60 11/15/2017 2142   Lab Results  Component Value Date   HGBA1C 5.7 (H) 05/05/2024   HGBA1C 5.6 10/17/2015   Lab Results  Component Value Date   INSULIN  4.8 05/05/2024   INSULIN  11.2 01/20/2024   Lab Results  Component Value Date   TSH 2.190 01/20/2024   CBC    Component Value Date/Time   WBC 6.7 04/21/2024 2310   RBC 4.50 04/21/2024 2310   HGB 12.9 04/21/2024 2310   HGB 14.3 01/20/2024 0953   HCT 38.7 04/21/2024 2310   HCT 42.3 01/20/2024 0953   PLT 210 04/21/2024 2310   PLT 253 01/20/2024 0953   MCV 86.0 04/21/2024 2310   MCV 87 01/20/2024 0953   MCH 28.7 04/21/2024 2310   MCHC 33.3 04/21/2024 2310   RDW 13.2 04/21/2024 2310   RDW 12.8 01/20/2024 0953   Iron Studies    Component Value Date/Time   IRON 89 11/14/2022 1321   TIBC 326.2 11/14/2022 1321   FERRITIN 87.8 11/14/2022 1321   IRONPCTSAT 27.3 11/14/2022 1321   Lipid Panel     Component Value Date/Time   CHOL 119 05/05/2024 0915   TRIG 53 05/05/2024 0915   HDL 48 05/05/2024 0915   CHOLHDL 2.5 05/05/2024 0915   CHOLHDL 3 09/04/2023 0837   VLDL 17.0 09/04/2023 0837   LDLCALC 59 05/05/2024 0915   LDLDIRECT 118.0 12/28/2019 1323   Hepatic Function Panel     Component Value Date/Time   PROT 6.7 05/05/2024 0915   ALBUMIN 4.3 05/05/2024 0915   AST 15 05/05/2024 0915   ALT 18 05/05/2024 0915   ALKPHOS 106 05/05/2024 0915   BILITOT 0.4 05/05/2024 0915   BILIDIR 0.1 09/04/2023 0837      Component Value Date/Time   TSH 2.190 01/20/2024 0953   Nutritional Lab Results  Component Value Date   VD25OH 70.3 05/05/2024   VD25OH 33.4  01/20/2024   VD25OH 37.16 04/04/2020    Attestations:   I, Vernell Forest, acting as a Stage manager for Brittany Jenkins, DO., have compiled all relevant documentation for today's office visit on behalf of Brittany Jenkins, DO, while in the presence of [employer], DO.  Reviewed by clinician on day of visit: allergies, medications, problem list, medical history, surgical history, family history, social history, and previous encounter notes pertinent to patient's obesity diagnosis.  I have reviewed the above documentation for accuracy and completeness, and I  agree with the above. Brittany JINNY [name], D.O.  The 21st Century Cures Act was signed into law in 2016 which includes the topic of electronic health records.  This provides immediate access to information in MyChart.  This includes consultation notes, operative notes, office notes, lab results and pathology reports.  If you have any questions about what you read please let us  know at your next visit so we can discuss your concerns and take corrective action if need be.  We are right here with you.

## 2024-06-16 ENCOUNTER — Encounter (INDEPENDENT_AMBULATORY_CARE_PROVIDER_SITE_OTHER): Payer: Self-pay

## 2024-06-16 ENCOUNTER — Telehealth (INDEPENDENT_AMBULATORY_CARE_PROVIDER_SITE_OTHER): Admitting: Psychology

## 2024-06-16 DIAGNOSIS — F419 Anxiety disorder, unspecified: Secondary | ICD-10-CM | POA: Diagnosis not present

## 2024-06-16 DIAGNOSIS — F5089 Other specified eating disorder: Secondary | ICD-10-CM

## 2024-06-16 NOTE — Progress Notes (Signed)
  Office: [phone]  /  Fax: [phone]    Date: June 16, 2024  Appointment Start Time: 8:00am Duration: 30 minutes Provider: Wyatt [name], Psy.D. Type of Session: Individual Therapy  Location of Patient: Home (private location) Location of Provider: Provider's Home (private office) Type of Contact: Telepsychological Visit via MyChart Video Visit  Session Content: [name] is a 58 y.o. female presenting for a follow-up appointment to address the previously established treatment goal of increasing coping skills. Today's appointment was a telepsychological visit. [name] provided verbal consent for today's telepsychological appointment and she is aware she is responsible for securing confidentiality on her end of the session. Prior to proceeding with today's appointment, [name]'s physical location at the time of this appointment was obtained as well a phone number she could be reached at in the event of technical difficulties. [name] and this provider participated in today's telepsychological service.   This provider conducted a brief check-in. 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 She reflected on recent emotional eating triggers and urge surfing, including examples where she successfully implemented learned skills. Notably, [name] shared an example of mindless eating. As such, psychoeducation provided regarding mindful eating strategies (sit down, slowly chew, savor, simplify, and smile). Of note, [name] shared she did not receive the previously shared email and there continued to be issues with email delivery. As such, the option of sending handouts via MyChart message was discussed and this provider explained messages would be visible to all providers, as it would be part of her electronic medical record. [name] verbally acknowledged understanding, and verbally consented to this provider sending all handouts via MyChart message moving  forward. Overall, [name] was receptive to today's appointment as evidenced by openness to sharing, responsiveness to feedback, and willingness to implement mindful eating strategies.  Mental Status Examination:  Appearance: neat Behavior: appropriate to circumstances Mood: neutral Affect: mood congruent Speech: normal in rate, volume, and tone Eye Contact: appropriate Psychomotor Activity: WNL Gait: unable to assess Thought Process: linear, logical, and goal directed and no evidence or endorsement of suicidal, homicidal, and self-harm ideation, plan and intent  Thought Content/Perception: no hallucinations, delusions, bizarre thinking or behavior endorsed or observed Orientation: AAOx4 Memory/Concentration: intact Insight: good Judgment: good  Interventions:  Conducted a brief chart review Provided empathic reflections and validation Reviewed content from the previous session Provided positive reinforcement Employed supportive psychotherapy interventions to facilitate reduced distress and to improve coping skills with identified stressors Psychoeducation provided regarding mindful eating strategies   DSM-5 Diagnosis(es): F50.89 Other Specified Feeding or Eating Disorder, Emotional Eating Behaviors and F41.9 Unspecified Anxiety Disorder  Treatment Goal & Progress: During the initial appointment with this provider, the following treatment goal was established: increase coping skills. [name] has demonstrated progress in her goal as evidenced by increased awareness of hunger patterns and increased awareness of triggers for emotional eating behaviors. [name] also continues to demonstrate willingness to engage in learned skills (e.g., urge surfing, mindful eating strategies).    Plan: The next appointment is scheduled for 07/06/2024 at 11am, which will be via MyChart Video Visit. The next session will focus on working towards the established treatment goal. Harjot will continue with her  primary therapist.    Brittany Fire, PsyD

## 2024-06-22 ENCOUNTER — Encounter (HOSPITAL_COMMUNITY): Payer: Self-pay

## 2024-06-23 ENCOUNTER — Telehealth (HOSPITAL_COMMUNITY): Payer: Self-pay | Admitting: *Deleted

## 2024-06-23 ENCOUNTER — Ambulatory Visit (INDEPENDENT_AMBULATORY_CARE_PROVIDER_SITE_OTHER): Admitting: Psychology

## 2024-06-23 ENCOUNTER — Ambulatory Visit (INDEPENDENT_AMBULATORY_CARE_PROVIDER_SITE_OTHER): Admitting: Family Medicine

## 2024-06-23 ENCOUNTER — Encounter (INDEPENDENT_AMBULATORY_CARE_PROVIDER_SITE_OTHER): Payer: Self-pay

## 2024-06-23 DIAGNOSIS — F411 Generalized anxiety disorder: Secondary | ICD-10-CM

## 2024-06-23 DIAGNOSIS — Z91199 Patient's noncompliance with other medical treatment and regimen due to unspecified reason: Secondary | ICD-10-CM

## 2024-06-23 NOTE — Progress Notes (Signed)
 [name] did not show for this scheduled appointment with us  today

## 2024-06-23 NOTE — Progress Notes (Signed)
 [facility] Counselor/Therapist Progress Note  Patient ID: [name], MRN: [id number],    Date: 06/23/24   Time Spent: 12:01pm-12:54pm  Treatment Type: Individual Therapy  Pt is seen for a virtual video visit via caregility. Pt consents to virtual visit and is aware of limitations of virtual visits.  Pt joins from her home, reporting privacy, and counselor from her home office.     Reported Symptoms: pt reports stress and tired.  Pt reports negative self talk.    Mental Status Exam: Appearance:  Well Groomed     Behavior: Appropriate  Motor: Normal  Speech/Language:  Normal Rate  Affect: Appropriate  Mood: anxious  Thought process: normal  Thought content:   WNL  Sensory/Perceptual disturbances:   WNL  Orientation: oriented to person, place, time/date, and situation  Attention: Good  Concentration: Good  Memory: WNL  Fund of knowledge:  Good  Insight:   Good  Judgment:  Good  Impulse Control: Good   Risk Assessment: Danger to Self:  No Self-injurious Behavior: No Danger to Others: No Duty to Warn:no Physical Aggression / Violence:No  Access to Firearms a concern: No  Gang Involvement:No   Subjective: Counselor assessed pt current functioning per pt report.  Processed w/pt emotions and self talk. explored recent stressors.  Discussed coping skills and managing expectations.  Explored negative self talk about body and changes and reframing.  Pt affect wnl.  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  Pt discussed that she is making progress w/ her health and also some stagnant w/ weight loss.  Pt discussed difficulty being consistent w/ changes and finding time for planning.  Pt reports that her grandson was born premature at 40 weeks.  Pt reports he is in the NICU and parents don't want others visiting him at this time.  Pt discussed struggles w/ body image- as losing weight and fat focused on loose  skin.  Pt recognized that her focus on is not what others seeing.  Pt discussed giving self time to adjust to body as changes before deciding on other interventions.    Interventions: Cognitive Behavioral Therapy, Mindfulness Meditation, and supportive  Diagnosis:Generalized anxiety disorder  Plan: Pt to f/u w/ Counseling in 2 weeks.  Pt to f/u w/ PCP, Cardiologist and [facility] as scheduled.   Individualized Treatment Plan Strengths: enjoys walking and movement, enjoys the beach, family is important to her  Supports: her husband, her daughter in law and her mom    Goal/Needs for Treatment:  In order of importance to patient 1) cope with anxiety 2) increase self worth and decrease negative thought patterns.  3) continue cope through losses       Client Statement of Needs:   talk about my grief, continue to work through my anxiety, try not be so negative on stuff and thinking things are going to go bad or wrong.  Finding time for self again.        Treatment Level: outpatient counseling  Symptoms:anxiety, worry, ruminating thoughts, low self worth, emotional escalations, depressed mood, sleep disturbance  Client Treatment Preferences: counseling every 2-4 weeks and continue medication management w/ PCP    Healthcare consumer's goal for treatment:   Counselor, Damien Herald, [facility] will support the patient's ability to achieve the goals identified. Cognitive Behavioral Therapy, Assertive Communication/Conflict Resolution Training, Relaxation Training, ACT, Humanistic and other evidenced-based practices will be used to promote progress towards healthy functioning.  Healthcare consumer will: Actively participate in therapy, working towards healthy functioning.     *Justification for Continuation/Discontinuation of Goal: R=Revised, O=Ongoing, A=Achieved, D=Discontinued   Goal 1) Pt will increase coping with life's anxiety and stressors AEB decreased ruminating worry,  decreased emotional escalations, decreased negative thought patterns. That contribute to anxiety. Baseline date 03/10/24: Progress towards goal 40%; How Often - Daily Target Date Goal Was reviewed Status Code Progress towards goal  03/10/25                            Goal 2)  Increase self awareness, consistency w/ self care time and self compassion statements AEB pt report and therapist observation.   Baseline date 03/10/24: Progress towards goal 30; How Often - Daily Target Date Goal Was reviewed Status Code Progress towards goal/Likert rating  03/10/25                            Goal 3) Verbalize feelings of grief related to losses and recognize ways of living with grief present.  Baseline date 03/10/24: Progress towards goal 30; How Often - Daily Target Date Goal Was reviewed Status Code Progress towards goal/Likert rating  03/10/25                            This plan has been reviewed and created by the following participants:  This plan will be reviewed at least every 12 months. Date Behavioral Health Clinician Date Guardian/Patient   03/10/24 [facility]                      03/10/24 Verbal consent provided and request for electronic signature sent and obtained              [name] [facility]

## 2024-06-23 NOTE — Telephone Encounter (Signed)
 Reaching out to patient to offer assistance regarding upcoming cardiac imaging study; pt verbalizes understanding of appt date/time, parking situation and where to check in, pre-test NPO status and verified current allergies; name and call back number provided for further questions should they arise  Chantal Requena RN Navigator Cardiac Imaging [facility] [phone] office [phone] cell   Patient aware to avoid caffeine 12 hours prior to her cardiac PET appt.

## 2024-06-24 ENCOUNTER — Ambulatory Visit (HOSPITAL_COMMUNITY): Admission: RE | Admit: 2024-06-24 | Source: Ambulatory Visit

## 2024-06-30 ENCOUNTER — Telehealth: Payer: Self-pay | Admitting: Cardiology

## 2024-06-30 DIAGNOSIS — R0609 Other forms of dyspnea: Secondary | ICD-10-CM

## 2024-06-30 DIAGNOSIS — R072 Precordial pain: Secondary | ICD-10-CM

## 2024-06-30 NOTE — Telephone Encounter (Signed)
 Pt called in stating her insurance told her they will not cover pet scan at all. She asked what other suggestions does Dr Kate have. Please advise.

## 2024-07-01 ENCOUNTER — Encounter: Payer: Self-pay | Admitting: Sleep Medicine

## 2024-07-01 NOTE — Telephone Encounter (Signed)
 Download is on your desk to review.

## 2024-07-01 NOTE — Telephone Encounter (Signed)
 Called and made patient aware that Dr. Kate recommendation Exercise Myoview. Order place and patient verbalized understanding

## 2024-07-01 NOTE — Telephone Encounter (Signed)
 Recommend exercise Myoview

## 2024-07-01 NOTE — Telephone Encounter (Signed)
 Dr. Jess has settings turned to 4-9 cm of H2O

## 2024-07-06 ENCOUNTER — Telehealth (INDEPENDENT_AMBULATORY_CARE_PROVIDER_SITE_OTHER): Admitting: Psychology

## 2024-07-06 DIAGNOSIS — F419 Anxiety disorder, unspecified: Secondary | ICD-10-CM

## 2024-07-06 DIAGNOSIS — F5089 Other specified eating disorder: Secondary | ICD-10-CM | POA: Diagnosis not present

## 2024-07-06 NOTE — Progress Notes (Signed)
  Office: [phone]  /  Fax: [phone]    Date: July 06, 2024  Appointment Start Time: 11:03am Duration: 29 minutes Provider: Wyatt Fire, Psy.D. Type of Session: Individual Therapy  Location of Patient: Home (private location) Location of Provider: Provider's Home (private office) Type of Contact: Telepsychological Visit via MyChart Video Visit  Session Content: [name] is a 58 y.o. female presenting for a follow-up appointment to address the previously established treatment goal of increasing coping skills.Today's appointment was a telepsychological visit. [name] provided verbal consent for today's telepsychological appointment and she is aware she is responsible for securing confidentiality on her end of the session. Prior to proceeding with today's appointment, [name]'s physical location at the time of this appointment was obtained as well a phone number she could be reached at in the event of technical difficulties. [name] and this provider participated in today's telepsychological service.   This provider conducted a brief check-in. [name] stated her mother is visiting, noting eating has been inconsistent. Nevertheless, she stated she focused on implementing learned strategies from previous appointments with this provider. [name] stated she has awareness of what to do but experiences challenges with follow through. Further explored and processed. [name] shared multiple examples and it was reflected she is engaging in emotional eating behaviors. Reviewed mindful eating strategies. [name] was engaged in problem solving to develop a plan to further help cope with urges/cravings involving activities to relax, activities to distract, comforting places, people to call and connect with, and activities that help soothe senses. She was observed writing the plan. Overall, [name] was receptive to today's appointment as evidenced by openness to sharing, responsiveness to feedback, and willingness  to implement discussed strategies .  Mental Status Examination:  Appearance: neat Behavior: appropriate to circumstances Mood: neutral Affect: mood congruent Speech: normal in rate, volume, and tone Eye Contact: appropriate Psychomotor Activity: WNL Gait: unable to assess Thought Process: linear, logical, and goal directed and no evidence or endorsement of suicidal, homicidal, and self-harm ideation, plan and intent  Thought Content/Perception: no hallucinations, delusions, bizarre thinking or behavior endorsed or observed Orientation: AAOx4 Memory/Concentration: intact Insight: good Judgment: good  Interventions:  Conducted a brief chart review Provided empathic reflections and validation Reviewed content from the previous session Provided positive reinforcement Employed supportive psychotherapy interventions to facilitate reduced distress and to improve coping skills with identified stressors Engaged patient in problem solving  DSM-5 Diagnosis(es): F50.89 Other Specified Feeding or Eating Disorder, Emotional Eating Behaviors and F41.9 Unspecified Anxiety Disorder  Treatment Goal & Progress:During the initial appointment with this provider, the following treatment goal was established: increase coping skills. [name] has demonstrated progress in her goal as evidenced by increased awareness of hunger patterns and increased awareness of triggers for emotional eating behaviors. [name] also continues to demonstrate willingness to engage in learned skills (e.g., urge surfing, mindful eating strategies).    Plan: The next appointment is scheduled for 07/20/2024 at 12pm, which will be via MyChart Video Visit. The next session will focus on working towards the established treatment goal. [name] will continue with her primary therapist.    Wyatt Fire, PsyD

## 2024-07-08 ENCOUNTER — Other Ambulatory Visit: Payer: Self-pay | Admitting: *Deleted

## 2024-07-08 ENCOUNTER — Ambulatory Visit (INDEPENDENT_AMBULATORY_CARE_PROVIDER_SITE_OTHER): Admitting: Family Medicine

## 2024-07-08 ENCOUNTER — Encounter (INDEPENDENT_AMBULATORY_CARE_PROVIDER_SITE_OTHER): Payer: Self-pay | Admitting: Family Medicine

## 2024-07-08 VITALS — BP 107/63 | HR 72 | Temp 98.4°F | Ht 66.0 in | Wt 164.0 lb

## 2024-07-08 DIAGNOSIS — F5089 Other specified eating disorder: Secondary | ICD-10-CM | POA: Diagnosis not present

## 2024-07-08 DIAGNOSIS — E669 Obesity, unspecified: Secondary | ICD-10-CM

## 2024-07-08 DIAGNOSIS — R7303 Prediabetes: Secondary | ICD-10-CM

## 2024-07-08 DIAGNOSIS — E538 Deficiency of other specified B group vitamins: Secondary | ICD-10-CM

## 2024-07-08 DIAGNOSIS — Z6826 Body mass index (BMI) 26.0-26.9, adult: Secondary | ICD-10-CM

## 2024-07-08 DIAGNOSIS — E559 Vitamin D deficiency, unspecified: Secondary | ICD-10-CM

## 2024-07-08 DIAGNOSIS — Z6841 Body Mass Index (BMI) 40.0 and over, adult: Secondary | ICD-10-CM

## 2024-07-08 MED ORDER — VITAMIN D (ERGOCALCIFEROL) 1.25 MG (50000 UNIT) PO CAPS: 50000.0000 [IU] | ORAL_CAPSULE | ORAL | 0 refills | Status: DC

## 2024-07-08 MED ORDER — CYANOCOBALAMIN 500 MCG PO TABS
ORAL_TABLET | ORAL | Status: AC
Start: 2024-07-08 — End: ?

## 2024-07-08 MED ORDER — BUPROPION HCL ER (SR) 100 MG PO TB12: 100.0000 mg | ORAL_TABLET | Freq: Two times a day (BID) | ORAL | 0 refills | Status: DC

## 2024-07-08 MED ORDER — METFORMIN HCL 500 MG PO TABS: ORAL_TABLET | ORAL | 0 refills | Status: DC

## 2024-07-08 NOTE — Progress Notes (Signed)
 [name], D.O.  ABFM, ABOM Specializing in Clinical Bariatric Medicine  Office located at: [address] [place], [place]  [address]   Assessment and Plan:   Medications Discontinued During This Encounter  Medication Reason   cyanocobalamin  (VITAMIN B12) 500 MCG tablet Reorder   Vitamin D , Ergocalciferol , (DRISDOL ) 1.25 MG (50000 UNIT) CAPS capsule Reorder   buPROPion  ER (WELLBUTRIN  SR) 100 MG 12 hr tablet Reorder     Meds ordered this encounter  Medications   buPROPion  ER (WELLBUTRIN  SR) 100 MG 12 hr tablet    Sig: Take 1 tablet (100 mg total) by mouth 2 (two) times daily.    Dispense:  60 tablet    Refill:  0   cyanocobalamin  (VITAMIN B12) 500 MCG tablet    Sig: 300 mcg- 500 mcg daily   Vitamin D , Ergocalciferol , (DRISDOL ) 1.25 MG (50000 UNIT) CAPS capsule    Sig: Take 1 capsule (50,000 Units total) by mouth every 14 (fourteen) days.    Dispense:  5 capsule    Refill:  0   metFORMIN  (GLUCOPHAGE ) 500 MG tablet    Sig: 1 po with lunch daily    Dispense:  30 tablet    Refill:  0    30 d supply;  ** OV for RF **   Do not send RF request      FOR THE DISEASE OF OBESITY: Obesity (BMI 30-39.9) - Starting BMI 30.52 BMI 25.0-29.9, adult -- current bmi 26.47 Assessment & Plan: Since last office visit on 06/10/24 patient's muscle mass has decreased by 2.4 lbs. Fat mass has decreased by 1.2 lbs. Total body water  has decreased by 5 lbs.  Counseling done on how various foods will affect these numbers and how to maximize success  Total lbs lost to date: 25 lbs Total weight loss percentage to date: -13.23%   Recommended Dietary Goals [name] is currently in the action stage of change. As such, her goal is to continue weight management plan.  She has agreed to: continue current plan and was encouraged to start journaling.    Behavioral Intervention We discussed the following today: increasing lean protein intake to established goals, decreasing simple carbohydrates  , avoiding skipping meals, work on meal planning and preparation, work on tracking and journaling calories using tracking application, and decreasing eating out or consumption of processed foods, and making healthy choices when eating convenient foods  Additional resources provided today: Handout on complex carbohydrates and lean sources of protein and Handout on Benefits of Metformin .  Evidence-based interventions for health behavior change were utilized today including the discussion of self monitoring techniques, problem-solving barriers and SMART goal setting techniques.   Regarding patient's less desirable eating habits and patterns, we employed the technique of small changes.   Pt will specifically work on: Work towards following his meal plan at 100% and starting to journal daily intake   Recommended Physical Activity Goals [name] has been advised to work up to 300-450 minutes of moderate intensity aerobic activity a week and strengthening exercises 2-3 times per week for cardiovascular health, weight loss maintenance and preservation of muscle mass.   She has agreed to: Continue current level of physical activity  and Increase physical activity in their day and reduce sedentary time (increase NEAT).   Pharmacotherapy We both agreed to: Continue with current nutritional and behavioral strategies and INITIATE Metformin  today.   ASSOCIATED CONDITIONS ADDRESSED TODAY:  Other Specified Feeding or Eating Disorder, Emotional Eating Behaviors Assessment & Plan: Pt  is on Wellbutrin  SR 100 mg BID; restarted on 5/13. Good compliance and tolerance.  Wellbutrin  has helped with mood and emotional changes. She notes her hunger/cravings/food noise increases in the evening as the Wellbutrin  tends to wear off. No issues with sleep.   Encouraged pt to increased protein intake for better control of hunger/cravings/food noise in the evenings. Discussed her adjusting the time she takes doses throughout  the day for optimal effectiveness in the evenings. May also consider increasing her Wellbutrin  to 150 mg BID in the future. For now, continue with current regimen.     Prediabetes Assessment & Plan: Lab Results  Component Value Date   HGBA1C 5.7 (H) 05/05/2024   HGBA1C 5.5 01/20/2024   HGBA1C 5.7 09/04/2023   INSULIN  4.8 05/05/2024   INSULIN  11.2 01/20/2024    Not currently on any meds. Diet/lifestyle approach. Last A1c was 5.7 and insulin  improved to 4.8 as of 05/05/2024. Hunger/cravings/food noise uncontrolled.   Education on role of insulin  w hunger/cravings. Increase protein intake to help stabilize/improve insulin  sensitivity. Shared decision making to INITIATE Metformin  today to help stabilize sugars and better control of hunger/cravings. Risks/benefits reviewed. Start Metformin  at half tab once daily w lunch and if tolerating well after 4 days, increase to full tab once daily. Follow meal plan as prescribed to avoid SE.     Vitamin B12 insufficeny- new onset Assessment & Plan: Lab Results  Component Value Date   VITAMINB12 646 05/05/2024   VITAMINB12 411 01/20/2024   VITAMINB12 344 01/10/2016   Pt taking OTC B12 500 mcg once daily. Good compliance and tolerance. No adverse SE reported.   Continue with current supplementation. Metformin  may decrease B12 levels; counseling on importance of adherence to supplements provided. Will recheck levels periodically.     Vitamin D  deficiency Assessment & Plan: Lab Results  Component Value Date   VD25OH 70.3 05/05/2024   VD25OH 33.4 01/20/2024   VD25OH 37.16 04/04/2020   Compliant with ERGO 50K units once weekly. Good tolerance reported, no SE.   Continue supplementation at current dose. Refilling ERGO today. Recheck periodically.     Follow up:   Return for Keep upcoming appt on 07/22/2024 7:40 AM. Make another appt in 2-3 weeks. [name] She was informed of the importance of frequent follow up visits to maximize her success with  intensive lifestyle modifications for her multiple health conditions.  Subjective:   Chief complaint: Obesity [name] is here to discuss her progress with her obesity treatment plan. She is on the Category 1 Plan and states she is following her eating plan approximately 50% of the time. 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   Interval History:  [name] is here for a follow up office visit. Since last OV on 06/10/24, she is down 4 lbs. She has been prioritizing her protein intake. She believed she may have gained weight due to recent stress related to care-giving, but is pleased to know she has lost weight. Has made healthier alternatives to her favorite foods such as plant based pasta with ground [place]. She does occasionally eating off plan; last week had pizza. 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   Pharmacotherapy that aid with weight loss: She is currently taking Wellbutrin  SR 100 mg BID.  Review of  Systems:  Pertinent positives were addressed with patient today.  Reviewed by clinician on day of visit: allergies, medications, problem list, medical history, surgical history, family history, social history, and previous encounter notes.  Weight Summary and Biometrics   Weight Lost Since Last Visit: 4lb  Weight Gained Since Last Visit: 0lb    Vitals Temp: 98.4 F (36.9 C) BP: 107/63 Pulse Rate: 72 SpO2: 99 %   Anthropometric Measurements Height: 5' 6 (1.676 m) Weight: 164 lb (74.4 kg) BMI (Calculated): 26.48 Weight at Last Visit: 168lb Weight Lost Since Last Visit: 4lb Weight Gained Since Last Visit: 0lb Starting Weight: 189lb Total Weight Loss (lbs): 25 lb (11.3 kg) Peak Weight: 250lb Waist Measurement : 36.5 inches   Body Composition  Body Fat  %: 27.1 % Fat Mass (lbs): 44.4 lbs Muscle Mass (lbs): 113.8 lbs Total Body Water  (lbs): 68.6 lbs Visceral Fat Rating : 6   Other Clinical Data Fasting: No Labs: No Today's Visit #: 10 Starting Date: 01/20/24 Comments: Cat 1    Objective:   PHYSICAL EXAM: Blood pressure 107/63, pulse 72, temperature 98.4 F (36.9 C), height 5' 6 (1.676 m), weight 164 lb (74.4 kg), last menstrual period 12/24/2006, SpO2 99%. Body mass index is 26.47 kg/m.  General: she is overweight, cooperative and in no acute distress. PSYCH: Has normal mood, affect and thought process.   HEENT: EOMI, sclerae are anicteric. Lungs: Normal breathing effort, no conversational dyspnea. Extremities: Moves * 4 Neurologic: A and O * 3, good insight  DIAGNOSTIC DATA REVIEWED: BMET    Component Value Date/Time   NA 141 05/05/2024 0915   K 4.2 05/05/2024 0915   CL 103 05/05/2024 0915   CO2 23 05/05/2024 0915   GLUCOSE 83 05/05/2024 0915   GLUCOSE 90 04/21/2024 2310   BUN 17 05/05/2024 0915   CREATININE 0.83 05/05/2024 0915   CALCIUM  9.5 05/05/2024 0915   GFRNONAA >60 04/21/2024 2310   GFRAA >60 11/15/2017 2142   Lab Results  Component Value Date   HGBA1C 5.7 (H) 05/05/2024   HGBA1C 5.6 10/17/2015   Lab Results  Component Value Date   INSULIN  4.8 05/05/2024   INSULIN  11.2 01/20/2024   Lab Results  Component Value Date   TSH 2.190 01/20/2024   CBC    Component Value Date/Time   WBC 6.7 04/21/2024 2310   RBC 4.50 04/21/2024 2310   HGB 12.9 04/21/2024 2310   HGB 14.3 01/20/2024 0953   HCT 38.7 04/21/2024 2310   HCT 42.3 01/20/2024 0953   PLT 210 04/21/2024 2310   PLT 253 01/20/2024 0953   MCV 86.0 04/21/2024 2310   MCV 87 01/20/2024 0953   MCH 28.7 04/21/2024 2310   MCHC 33.3 04/21/2024 2310   RDW 13.2 04/21/2024 2310   RDW 12.8 01/20/2024 0953   Iron Studies    Component Value Date/Time   IRON 89 11/14/2022 1321   TIBC 326.2 11/14/2022 1321   FERRITIN 87.8 11/14/2022 1321    IRONPCTSAT 27.3 11/14/2022 1321   Lipid Panel     Component Value Date/Time   CHOL 119 05/05/2024 0915   TRIG 53 05/05/2024 0915   HDL 48 05/05/2024 0915   CHOLHDL 2.5 05/05/2024 0915   CHOLHDL 3 09/04/2023 0837   VLDL 17.0 09/04/2023 0837   LDLCALC 59 05/05/2024 0915   LDLDIRECT 118.0 12/28/2019 1323   Hepatic Function Panel     Component Value Date/Time   PROT 6.7 05/05/2024 0915   ALBUMIN 4.3 05/05/2024 0915   AST  15 05/05/2024 0915   ALT 18 05/05/2024 0915   ALKPHOS 106 05/05/2024 0915   BILITOT 0.4 05/05/2024 0915   BILIDIR 0.1 09/04/2023 0837      Component Value Date/Time   TSH 2.190 01/20/2024 0953   Nutritional Lab Results  Component Value Date   VD25OH 70.3 05/05/2024   VD25OH 33.4 01/20/2024   VD25OH 37.16 04/04/2020    Attestations:   I, Vernell Forest, acting as a medical scribe for Brittany Jenkins, DO., have compiled all relevant documentation for today's office visit on behalf of Brittany Jenkins, DO, while in the presence of [employer], DO.  I have reviewed the above documentation for accuracy and completeness, and I agree with the above. Brittany [name], D.O.  The 21st Century Cures Act was signed into law in 2016 which includes the topic of electronic health records.  This provides immediate access to information in MyChart.  This includes consultation notes, operative notes, office notes, lab results and pathology reports.  If you have any questions about what you read please let us  know at your next visit so we can discuss your concerns and take corrective action if need be.  We are right here with you.

## 2024-07-08 NOTE — Progress Notes (Unsigned)
 Attestation order for [employer]

## 2024-07-13 ENCOUNTER — Encounter: Payer: Self-pay | Admitting: Cardiology

## 2024-07-13 ENCOUNTER — Telehealth (HOSPITAL_COMMUNITY): Payer: Self-pay

## 2024-07-13 NOTE — Telephone Encounter (Signed)
 Patient called for her instructions for her stress test. She stated that she would be here for her test. [name] CCT

## 2024-07-14 ENCOUNTER — Ambulatory Visit (INDEPENDENT_AMBULATORY_CARE_PROVIDER_SITE_OTHER): Admitting: Psychology

## 2024-07-14 DIAGNOSIS — F4321 Adjustment disorder with depressed mood: Secondary | ICD-10-CM | POA: Diagnosis not present

## 2024-07-14 DIAGNOSIS — F411 Generalized anxiety disorder: Secondary | ICD-10-CM

## 2024-07-14 NOTE — Progress Notes (Signed)
 [facility] Counselor/Therapist Progress Note  Patient ID: [name], MRN: [id number],    Date: 07/14/24   Time Spent: 12:00pm-12:54pm  Treatment Type: Individual Therapy  Pt is seen for a virtual video visit via caregility. Pt consents to virtual visit and is aware of limitations of virtual visits.  Pt joins from her home, reporting privacy, and counselor from her home office.     Reported Symptoms: pt reports positive visit w/ her mom.  Pt reports anxiety w/ her leaving and worry. .    Mental Status Exam: Appearance:  Well Groomed     Behavior: Appropriate  Motor: Normal  Speech/Language:  Normal Rate  Affect: Appropriate  Mood: anxious  Thought process: normal  Thought content:   WNL  Sensory/Perceptual disturbances:   WNL  Orientation: oriented to person, place, time/date, and situation  Attention: Good  Concentration: Good  Memory: WNL  Fund of knowledge:  Good  Insight:   Good  Judgment:  Good  Impulse Control: Good   Risk Assessment: Danger to Self:  No Self-injurious Behavior: No Danger to Others: No Duty to Warn:no Physical Aggression / Violence:No  Access to Firearms a concern: No  Gang Involvement:No   Subjective: Counselor assessed pt current functioning per pt report.  Processed w/pt emotions, positives and stressors w/ mom's visit.  Reflected positives of communication and opportunities.  Discussed worry and anxiety w/ mom's health and sadness feels once she leaves.  Discussed pt use of her coping skills and how this assisted her.  Pt affect wnl.  Pt reports she enjoyed her visit w/ her mom.  Pt reflected on positives of having her present and able to spend quality time together.  Pt reports able to reframe when starts focus on  wish I would have etc.  Pt discussed how she was tearful and anxious w/ hr mom leaving and how does think about her health and time left together.  Pt was able to use ground skills to manage through.   Pt reflected on use other skills to assist hr coping.    Interventions: Cognitive Behavioral Therapy, Mindfulness Meditation, and supportive  Diagnosis:Generalized anxiety disorder  Grief  Plan: Pt to f/u w/ Counseling in 2 weeks.  Pt to f/u w/ PCP, Cardiologist and [facility] as scheduled.   Individualized Treatment Plan Strengths: enjoys walking and movement, enjoys the beach, family is important to her  Supports: her husband, her daughter in law and her mom    Goal/Needs for Treatment:  In order of importance to patient 1) cope with anxiety 2) increase self worth and decrease negative thought patterns.  3) continue cope through losses       Client Statement of Needs:   talk about my grief, continue to work through my anxiety, try not be so negative on stuff and thinking things are going to go bad or wrong.  Finding time for self again.        Treatment Level: outpatient counseling  Symptoms:anxiety, worry, ruminating thoughts, low self worth, emotional escalations, depressed mood, sleep disturbance  Client Treatment Preferences: counseling every 2-4 weeks and continue medication management w/ PCP    Healthcare consumer's goal for treatment:   Counselor, Damien Herald, [facility] will support the patient's ability to achieve the goals identified. Cognitive Behavioral Therapy, Assertive Communication/Conflict Resolution Training, Relaxation Training, ACT, Humanistic and other evidenced-based practices will be used to promote progress towards healthy functioning.    Healthcare consumer will: Actively  participate in therapy, working towards healthy functioning.     *Justification for Continuation/Discontinuation of Goal: R=Revised, O=Ongoing, A=Achieved, D=Discontinued   Goal 1) Pt will increase coping with life's anxiety and stressors AEB decreased ruminating worry, decreased emotional escalations, decreased negative thought patterns. That contribute to  anxiety. Baseline date 03/10/24: Progress towards goal 40%; How Often - Daily Target Date Goal Was reviewed Status Code Progress towards goal  03/10/25                            Goal 2)  Increase self awareness, consistency w/ self care time and self compassion statements AEB pt report and therapist observation.   Baseline date 03/10/24: Progress towards goal 30; How Often - Daily Target Date Goal Was reviewed Status Code Progress towards goal/Likert rating  03/10/25                            Goal 3) Verbalize feelings of grief related to losses and recognize ways of living with grief present.  Baseline date 03/10/24: Progress towards goal 30; How Often - Daily Target Date Goal Was reviewed Status Code Progress towards goal/Likert rating  03/10/25                            This plan has been reviewed and created by the following participants:  This plan will be reviewed at least every 12 months. Date Behavioral Health Clinician Date Guardian/Patient   03/10/24 [facility] Barbarann [facility]                      03/10/24 Verbal consent provided and request for electronic signature sent and obtained              [name] [facility]

## 2024-07-17 ENCOUNTER — Other Ambulatory Visit: Payer: Self-pay | Admitting: Cardiology

## 2024-07-17 DIAGNOSIS — R0609 Other forms of dyspnea: Secondary | ICD-10-CM

## 2024-07-17 DIAGNOSIS — R072 Precordial pain: Secondary | ICD-10-CM

## 2024-07-20 ENCOUNTER — Telehealth (INDEPENDENT_AMBULATORY_CARE_PROVIDER_SITE_OTHER): Admitting: Psychology

## 2024-07-20 ENCOUNTER — Encounter: Payer: Self-pay | Admitting: Cardiology

## 2024-07-20 ENCOUNTER — Ambulatory Visit (HOSPITAL_COMMUNITY)
Admission: RE | Admit: 2024-07-20 | Discharge: 2024-07-20 | Disposition: A | Discharge status: Home / Self Care | Source: Ambulatory Visit | Attending: Cardiology | Admitting: Cardiology

## 2024-07-20 ENCOUNTER — Telehealth (INDEPENDENT_AMBULATORY_CARE_PROVIDER_SITE_OTHER): Payer: Self-pay | Admitting: Psychology

## 2024-07-20 DIAGNOSIS — R072 Precordial pain: Secondary | ICD-10-CM | POA: Diagnosis present

## 2024-07-20 DIAGNOSIS — R0609 Other forms of dyspnea: Secondary | ICD-10-CM | POA: Diagnosis present

## 2024-07-20 DIAGNOSIS — R0600 Dyspnea, unspecified: Secondary | ICD-10-CM | POA: Insufficient documentation

## 2024-07-20 LAB — MYOCARDIAL PERFUSION IMAGING
Angina Index: 1
Base ST Depression (mm): 0 mm
Duke Treadmill Score: -1
Estimated workload: 9.7
Exercise duration (min): 8 min
Exercise duration (sec): 15 s
LV dias vol: 71 mL (ref 46–106)
LV sys vol: 23 mL (ref 3.8–5.2)
MPHR: 162 {beats}/min
Nuc Stress EF: 68 %
Peak HR: 142 {beats}/min
Percent HR: 87 %
Rest HR: 63 {beats}/min
Rest Nuclear Isotope Dose: 10.5 mCi
SDS: 0
SRS: 0
SSS: 0
ST Depression (mm): 1 mm
Stress Nuclear Isotope Dose: 31.1 mCi
TID: 1.04

## 2024-07-20 MED ORDER — TECHNETIUM TC 99M TETROFOSMIN IV KIT
10.5000 | PACK | Freq: Once | INTRAVENOUS | Status: AC | PRN
  Administered 2024-07-20: 10.5 via INTRAVENOUS

## 2024-07-20 MED ORDER — TECHNETIUM TC 99M TETROFOSMIN IV KIT
31.1000 | PACK | Freq: Once | INTRAVENOUS | Status: AC | PRN
  Administered 2024-07-20: 31.1 via INTRAVENOUS

## 2024-07-20 NOTE — Progress Notes (Unsigned)
  Office: [phone]  /  Fax: [phone]    Date: July 20, 2024  Appointment Start Time: *** Duration: *** minutes Provider: Wyatt Fire, Psy.D. Type of Session: Individual Therapy  Location of Patient: {gbptloc:23249} (private location) Location of Provider: Provider's Home (private office) Type of Contact: Telepsychological Visit via MyChart Video Visit  Session Content: This provider called [name] at 12:03pm as she did not present for today's appointment. A voicemail could not be left as the message indicated The call cannot be completed at this time. Please hang up and try again later. [name] was observed joining shortly after. As such, today's appointment was initiated *** minutes late.[name] is a 58 y.o. female presenting for a follow-up appointment to address the previously established treatment goal of increasing coping skills.Today's appointment was a telepsychological visit. [name] provided verbal consent for today's telepsychological appointment and she is aware she is responsible for securing confidentiality on her end of the session. Prior to proceeding with today's appointment, [name]'s physical location at the time of this appointment was obtained as well a phone number she could be reached at in the event of technical difficulties. [name] and this provider participated in today's telepsychological service.   This provider conducted a brief check-in. *** [name] was receptive to today's appointment as evidenced by openness to sharing, responsiveness to feedback, and {gbreceptiveness:23401}.  Mental Status Examination:  Appearance: {Appearance:22431} Behavior: {Behavior:22445} Mood: {gbmood:21757} Affect: {Affect:22436} Speech: {Speech:22432} Eye Contact: {Eye Contact:22433} Psychomotor Activity: {Motor Activity:22434} Gait: {gbgait:23404} Thought Process: {thought process:22448}  Thought Content/Perception: {disturbances:22451} Orientation:  {Orientation:22437} Memory/Concentration: {gbcognition:22449} Insight: {Insight:22446} Judgment: {Insight:22446}  Interventions:  {Interventions for Progress Notes:23405}  DSM-5 Diagnosis(es): {Diagnoses:22752}  Treatment Goal & Progress: During the initial appointment with this provider, the following treatment goal was established: increase coping skills. [name] has demonstrated progress in her goal as evidenced by {gbtxprogress:22839}. [name] also {gbtxprogress2:22951}.  Plan: The next appointment is scheduled for *** at ***, which will be via MyChart Video Visit. The next session will focus on {Plan for Next Appointment:23400}.   Wyatt Fire, PsyD

## 2024-07-20 NOTE — Telephone Encounter (Signed)
  Office: [phone]  /  Fax: [phone]  Date of Call: July 20, 2024  Provider: Wyatt Fire, PsyD  CONTENT: This provider called [name] to check-in as she did not present for today's MyChart Video Visit appointment. After ringing for awhile, a voicemail could not be left as the message indicated The call cannot be completed at this time. Please hang up and try again later. Of note, this provider stayed on the MyChart Video Visit appointment for 5 minutes prior to signing off per the clinic's grace period policy.    PLAN: This provider will attempt to reach [facility] again tomorrow if needed.

## 2024-07-21 ENCOUNTER — Ambulatory Visit: Payer: Self-pay | Admitting: Cardiology

## 2024-07-21 ENCOUNTER — Telehealth (INDEPENDENT_AMBULATORY_CARE_PROVIDER_SITE_OTHER): Payer: Self-pay | Admitting: Psychology

## 2024-07-21 NOTE — Telephone Encounter (Signed)
 Yes we can change to virtual visit

## 2024-07-21 NOTE — Telephone Encounter (Signed)
  Office: [phone]  /  Fax: [phone]  Date of Call: July 21, 2024  Duration of Call: 3 minute(s) Provider: Wyatt Fire, PsyD  CONTENT: This provider called [name] to check-in and schedule a follow-up appointment. She apologized for not presenting for yesterday's appointment due to a stress test appointment and having a lot going on. Nyhla expressed desire to reschedule. All questions/concerns addressed.  No evidence or endorsement of any safety concerns.  PLAN: Keshonda is scheduled for an appointment on 08/03/2024 at 12pm.

## 2024-07-22 ENCOUNTER — Encounter (INDEPENDENT_AMBULATORY_CARE_PROVIDER_SITE_OTHER): Payer: Self-pay | Admitting: Family Medicine

## 2024-07-22 ENCOUNTER — Ambulatory Visit (INDEPENDENT_AMBULATORY_CARE_PROVIDER_SITE_OTHER): Admitting: Family Medicine

## 2024-07-22 VITALS — BP 103/68 | HR 60 | Temp 98.0°F | Ht 66.0 in | Wt 164.0 lb

## 2024-07-22 DIAGNOSIS — F5089 Other specified eating disorder: Secondary | ICD-10-CM | POA: Diagnosis not present

## 2024-07-22 DIAGNOSIS — R7303 Prediabetes: Secondary | ICD-10-CM

## 2024-07-22 DIAGNOSIS — E669 Obesity, unspecified: Secondary | ICD-10-CM | POA: Diagnosis not present

## 2024-07-22 DIAGNOSIS — E559 Vitamin D deficiency, unspecified: Secondary | ICD-10-CM

## 2024-07-22 DIAGNOSIS — Z6841 Body Mass Index (BMI) 40.0 and over, adult: Secondary | ICD-10-CM

## 2024-07-22 DIAGNOSIS — Z6826 Body mass index (BMI) 26.0-26.9, adult: Secondary | ICD-10-CM

## 2024-07-22 MED ORDER — BUPROPION HCL ER (SR) 150 MG PO TB12: 150.0000 mg | ORAL_TABLET | Freq: Two times a day (BID) | ORAL | 0 refills | Status: DC

## 2024-07-22 MED ORDER — METFORMIN HCL 500 MG PO TABS: ORAL_TABLET | ORAL | 0 refills | Status: DC

## 2024-07-22 NOTE — Progress Notes (Signed)
 [name], D.O.  ABFM, ABOM Specializing in Clinical Bariatric Medicine  Office located at: [address] [place], [place]  [address]     Assessment and Plan:   Medications Discontinued During This Encounter  Medication Reason   pantoprazole  (PROTONIX ) 20 MG tablet Patient Preference   estradiol  (ESTRACE ) 0.1 MG/GM vaginal cream Patient Preference   atorvastatin  (LIPITOR) 20 MG tablet Patient Preference   amphetamine -dextroamphetamine  (ADDERALL XR) 20 MG 24 hr capsule Patient Preference   buPROPion  ER (WELLBUTRIN  SR) 100 MG 12 hr tablet Reorder   metFORMIN  (GLUCOPHAGE ) 500 MG tablet Reorder    Meds ordered this encounter  Medications   buPROPion  (WELLBUTRIN  SR) 150 MG 12 hr tablet    Sig: Take 1 tablet (150 mg total) by mouth 2 (two) times daily.    Dispense:  60 tablet    Refill:  0   metFORMIN  (GLUCOPHAGE ) 500 MG tablet    Sig: 1 po with lunch and dinner daily    Dispense:  60 tablet    Refill:  0    30 d supply;  ** OV for RF **   Do not send RF request      FOR THE DISEASE OF OBESITY:  Obesity (BMI 30-39.9) - Starting BMI 30.52 BMI 25.0-29.9, adult -- current bmi 26.47 Assessment & Plan: Since last office visit on 07/08/24 patient's muscle mass has decreased by 12.8 lbs. Fat mass has increased by 13.4 lbs. Total body water  has increased by 1. lbs.  Body fat % has increased by 8.1%. Counseling done on how various foods will affect these numbers and how to maximize success  Total lbs lost to date: 25 lbs Total weight loss percentage to date: -13.23 %   Recommended Dietary Goals [name] is currently in the action stage of change. As such, her goal is to continue weight management plan.  She has agreed to: continue current plan   Behavioral Intervention We discussed the following today: increasing lean protein intake to established goals, keeping healthy foods at home, better snacking choices, and continue to work on maintaining a reduced calorie  state, getting the recommended amount of protein, incorporating whole foods, making healthy choices, staying well hydrated and practicing mindfulness when eating.  Additional resources provided today: None  Evidence-based interventions for health behavior change were utilized today including the discussion of self monitoring techniques, problem-solving barriers and SMART goal setting techniques.   Regarding patient's less desirable eating habits and patterns, we employed the technique of small changes.   Pt will specifically work on: n/a    Recommended Physical Activity Goals [name] has been advised to work up to 300-450 minutes of moderate intensity aerobic activity a week and strengthening exercises 2-3 times per week for cardiovascular health, weight loss maintenance and preservation of muscle mass.   She has agreed to: Continue current level of physical activity    Pharmacotherapy We both agreed to: Continue with current nutritional and behavioral strategies and INCREASE Wellbutrin  to 150 mg BID and INCREASE Metformin  to 500 mg BID w lunch and dinner.   ASSOCIATED CONDITIONS ADDRESSED TODAY:  Other Specified Feeding or Eating Disorder, Emotional Eating Behaviors Assessment & Plan: Currently Wellbutrin  SR 100 mg BID. Compliant and tolerating well. No adverse side effects. Still having food noise in the evenings. Prioritizes protein snacks to curb food noise. Working w Dr. Sharron, which has been helpful for her.   Continue to aim to meet daily protein goal for continued support of hunger/cravings/food noise. Reminded  pt to continue prioritizing her physical and mental health. Shared decision making to INCREASE Wellbutrin  to 150 mg BID. Reviewed risks/benefits w patient. Will continue monitoring.     Prediabetes Assessment & Plan: Lab Results  Component Value Date   HGBA1C 5.7 (H) 05/05/2024   HGBA1C 5.5 01/20/2024   HGBA1C 5.7 09/04/2023   INSULIN  4.8 05/05/2024   INSULIN  11.2  01/20/2024   Currently on Metformin  500 mg once daily. Good compliance and tolerance. No GI upset or adverse side effects. Endorses food noise. Did not notice any changes w this on Metformin .   Mutually agreed to INCREASE Metformin  to 500 mg BID w lunch and dinner. Taking at lunch and dinner for optimal effect with evening food noise. Reviewed risks/benefits. Pt verbalized understanding/understanding. Will continue monitoring.     Vitamin D  deficiency Assessment & Plan: Lab Results  Component Value Date   VD25OH 70.3 05/05/2024   VD25OH 33.4 01/20/2024   VD25OH 37.16 04/04/2020   Complaint w ERGO 50K units every 14 days. Tolerating well with no SE. Continue supplementation at current dose. Continue monitoring.    Follow up:   Return for f/u on 08/05/2024 at 9:20 AM. Schedule additional 2-3 wk f/u. She was informed of the importance of frequent follow up visits to maximize her success with intensive lifestyle modifications for her multiple health conditions.  Subjective:   Chief complaint: Obesity [name] is here to discuss her progress with her obesity treatment plan. She is on the Category 1 Plan and states she is following her eating plan approximately 90% of the time. She states she is swimming/walking 25-30 minutes 4 days per week.  Interval History:  [name] is here for a follow up office visit. Since last OV on 07/08/24, she has maintained her weight. She has been working on NEAT and is more intentional about walking on free time during work. Increasing her exercise has helped improved her energy levels. She is buying carved chicken and [place]. 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 She endorses increased food noise in the evenings and tends to eat a high protein snack.   Pharmacotherapy that aid with weight loss: She is currently taking Metformin  500 mg once daily and Wellbutrin  100 mg BID.   Review of Systems:  Pertinent positives were  addressed with patient today.  Reviewed by clinician on day of visit: allergies, medications, problem list, medical history, surgical history, family history, social history, and previous encounter notes.  Weight Summary and Biometrics   Weight Lost Since Last Visit: 0lb  Weight Gained Since Last Visit: 0lb    Vitals Temp: 98 F (36.7 C) BP: 103/68 Pulse Rate: 60 SpO2: 98 %   Anthropometric Measurements Height: 5' 6 (1.676 m) Weight: 164 lb (74.4 kg) BMI (Calculated): 26.48 Weight at Last Visit: 164lb Weight Lost Since Last Visit: 0lb Weight Gained Since Last Visit: 0lb Starting Weight: 189lb Total Weight Loss (lbs): 25 lb (11.3 kg) Peak Weight: 250lb Waist Measurement : 36.5 inches   Body Composition  Body Fat %: 35.2 % Fat Mass (lbs): 57.8 lbs Muscle Mass (lbs): 101 lbs Total Body Water  (lbs): 69.8 lbs Visceral Fat Rating : 8   Other Clinical Data Fasting: Yes Labs: No Today's Visit #: 11 Starting Date: 01/20/24 Comments: Cat 1    Objective:   PHYSICAL EXAM: Blood pressure 103/68, pulse 60, temperature 98 F (36.7 C), height 5' 6 (1.676 m), weight 164 lb (74.4 kg), last menstrual period 12/24/2006, SpO2  98%. Body mass index is 26.47 kg/m.  General: she is overweight, cooperative and in no acute distress. PSYCH: Has normal mood, affect and thought process.   HEENT: EOMI, sclerae are anicteric. Lungs: Normal breathing effort, no conversational dyspnea. Extremities: Moves * 4 Neurologic: A and O * 3, good insight  DIAGNOSTIC DATA REVIEWED: BMET    Component Value Date/Time   NA 141 05/05/2024 0915   K 4.2 05/05/2024 0915   CL 103 05/05/2024 0915   CO2 23 05/05/2024 0915   GLUCOSE 83 05/05/2024 0915   GLUCOSE 90 04/21/2024 2310   BUN 17 05/05/2024 0915   CREATININE 0.83 05/05/2024 0915   CALCIUM  9.5 05/05/2024 0915   GFRNONAA >60 04/21/2024 2310   GFRAA >60 11/15/2017 2142   Lab Results  Component Value Date   HGBA1C 5.7 (H) 05/05/2024    HGBA1C 5.6 10/17/2015   Lab Results  Component Value Date   INSULIN  4.8 05/05/2024   INSULIN  11.2 01/20/2024   Lab Results  Component Value Date   TSH 2.190 01/20/2024   CBC    Component Value Date/Time   WBC 6.7 04/21/2024 2310   RBC 4.50 04/21/2024 2310   HGB 12.9 04/21/2024 2310   HGB 14.3 01/20/2024 0953   HCT 38.7 04/21/2024 2310   HCT 42.3 01/20/2024 0953   PLT 210 04/21/2024 2310   PLT 253 01/20/2024 0953   MCV 86.0 04/21/2024 2310   MCV 87 01/20/2024 0953   MCH 28.7 04/21/2024 2310   MCHC 33.3 04/21/2024 2310   RDW 13.2 04/21/2024 2310   RDW 12.8 01/20/2024 0953   Iron Studies    Component Value Date/Time   IRON 89 11/14/2022 1321   TIBC 326.2 11/14/2022 1321   FERRITIN 87.8 11/14/2022 1321   IRONPCTSAT 27.3 11/14/2022 1321   Lipid Panel     Component Value Date/Time   CHOL 119 05/05/2024 0915   TRIG 53 05/05/2024 0915   HDL 48 05/05/2024 0915   CHOLHDL 2.5 05/05/2024 0915   CHOLHDL 3 09/04/2023 0837   VLDL 17.0 09/04/2023 0837   LDLCALC 59 05/05/2024 0915   LDLDIRECT 118.0 12/28/2019 1323   Hepatic Function Panel     Component Value Date/Time   PROT 6.7 05/05/2024 0915   ALBUMIN 4.3 05/05/2024 0915   AST 15 05/05/2024 0915   ALT 18 05/05/2024 0915   ALKPHOS 106 05/05/2024 0915   BILITOT 0.4 05/05/2024 0915   BILIDIR 0.1 09/04/2023 0837      Component Value Date/Time   TSH 2.190 01/20/2024 0953   Nutritional Lab Results  Component Value Date   VD25OH 70.3 05/05/2024   VD25OH 33.4 01/20/2024   VD25OH 37.16 04/04/2020    Attestations:   I, Vernell Forest, acting as a Stage manager for Brittany Jenkins, DO., have compiled all relevant documentation for today's office visit on behalf of Brittany Jenkins, DO, while in the presence of [employer], DO.  I have reviewed the above documentation for accuracy and completeness, and I agree with the above. Brittany JINNY [name], D.O.  The 21st Century Cures Act was signed into law in 2016  which includes the topic of electronic health records.  This provides immediate access to information in MyChart.  This includes consultation notes, operative notes, office notes, lab results and pathology reports.  If you have any questions about what you read please let us  know at your next visit so we can discuss your concerns and take corrective action if need be.  We are right here  with you.

## 2024-07-23 ENCOUNTER — Other Ambulatory Visit: Payer: Self-pay | Admitting: *Deleted

## 2024-07-23 NOTE — Progress Notes (Signed)
 Attestation entered in error . Patient already had myoview .

## 2024-07-26 NOTE — Progress Notes (Unsigned)
 Virtual Visit via Telephone Note   Because of [name]'s co-morbid illnesses, she is at least at moderate risk for complications without adequate follow up.  This format is felt to be most appropriate for this patient at this time.  The patient did not have access to video technology/had technical difficulties with video requiring transitioning to audio format only (telephone).  All issues noted in this document were discussed and addressed.  No physical exam could be performed with this format.  Please refer to the patient's chart for her consent to telehealth for [name].   Date:  07/27/2024   ID:  [name], DOB 25-Mar-1966, MRN [id number]  Patient Location: Home Provider Location: Office/Clinic  PCP:  Brittany Garnette LABOR, MD  Cardiologist:  Brittany LITTIE Nanas, MD  Electrophysiologist:  None   Evaluation Performed:  Follow-Up Visit  Chief Complaint:  chest pain  History of Present Illness:    [name] is a 58 y.o. [name] with with a hx of CAD, GERD, OSA who presents for follow-up.  She was referred by Dr. Johnny for evaluation of CAD, initially seen for 425. Calcium  score on 02/10/2024 was 843 (99th percentile).  She reported having dyspnea on exertion and chest tightness.  Echocardiogram 05/07/2024 showed normal biventricular function, no significant valvular disease.  Exercise Myoview  07/20/2024 showed good exercise capacity (9.7 METS), stress ECG positive for ischemia, normal myocardial perfusion, LVEF 68%.  Since last clinic visit, reports she is doing OK.  Reports occasional chest pain.  Continues to have some dyspnea but has improved.  Denies any lightheadedness, syncope, lower extremity edema.  Reports occasional Palpitations or feels her heart skipping beats but just last for seconds.  She is on CPAP, reports she is following with her sleep medicine doctor is having some issues tolerating mask.   Past Medical History:  Diagnosis Date    ADHD    Allergy    Anxiety    Back pain    CAD (coronary artery disease)    Constipated    Depression    Fatty liver    GERD (gastroesophageal reflux disease)    History of abnormal cervical Pap smear    1991 -- 1995--hx cryotherapy to cervix   History of colon polyps    2008- BENIGN   Hydronephrosis, left    Hyperlipidemia 2024   Kidney stones 2016   OSA (obstructive sleep apnea)    PONV (postoperative nausea and vomiting)    RLS (restless legs syndrome)    Rosacea    Vitamin D  deficiency    Past Surgical History:  Procedure Laterality Date   ABDOMINAL HYSTERECTOMY     ANTERIOR AND POSTERIOR REPAIR WITH SACROSPINOUS FIXATION N/A 08/16/2015   Procedure: ANTERIOR COLPORRHAPHY WITH XENOFORM GRAFT AND SACROSPINOUS FIXATION;  Surgeon: Brittany FORBES Cathlyn JAYSON Nikki, MD;  Location: [facility];  Service: Gynecology;  Laterality: N/A;  2.5 hours OR time   BLADDER SUSPENSION N/A 08/16/2015   Procedure: TRANSVAGINAL TAPE (TVT) PROCEDURE exact midurethral sling;  Surgeon: Brittany FORBES Cathlyn JAYSON Nikki, MD;  Location: [facility];  Service: Gynecology;  Laterality: N/A;   CHOLECYSTECTOMY  1998   COLONOSCOPY  08/21/2017   per Dr. Teressa, clear, repeat in 10 yrs   CYSTOCELE REPAIR N/A 12/12/2022   Procedure: ANTERIOR REPAIR (CYSTOCELE);  Surgeon: Brittany Browning SAILOR, MD;  Location: [name];  Service: Gynecology;  Laterality: N/A;   CYSTOSCOPY N/A 08/16/2015   Procedure: CYSTOSCOPY;  Surgeon: Brittany FORBES Cathlyn JAYSON  Nikki, MD;  Location: [facility];  Service: Gynecology;  Laterality: N/A;   CYSTOSCOPY N/A 12/12/2022   Procedure: CYSTOSCOPY;  Surgeon: Brittany Rosaline SAILOR, MD;  Location: [facility];  Service: Gynecology;  Laterality: N/A;   CYSTOSCOPY W/ RETROGRADES Bilateral 06/22/2015   Procedure: CYSTOSCOPY WITH RETROGRADE PYELOGRAM;  Surgeon: Belvie LITTIE Clara, MD;  Location: [name];  Service: Urology;  Laterality: Bilateral;   CYSTOSCOPY W/ URETERAL  STENT PLACEMENT  01/1999   CYSTOSCOPY WITH HOLMIUM LASER LITHOTRIPSY Left 05/18/2015   Procedure: CYSTOSCOPY WITH HOLMIUM LASER LITHOTRIPSY;  Surgeon: Ricardo Likens, MD;  Location: [facility];  Service: Urology;  Laterality: Left;   CYSTOSCOPY WITH RETROGRADE PYELOGRAM, URETEROSCOPY AND STENT PLACEMENT Left 06/22/2015   Procedure: CYSTOSCOPY,  LEFT URETEROSCOPY;  Surgeon: Belvie LITTIE Clara, MD;  Location: [facility];  Service: Urology;  Laterality: Left;   CYSTOSCOPY WITH URETEROSCOPY AND STENT PLACEMENT Left 05/18/2015   Procedure: CYSTOSCOPY WITH URETEROSCOPY, STONE MANIPULATION AND STENT PLACEMENT;  Surgeon: Ricardo Likens, MD;  Location: [facility];  Service: Urology;  Laterality: Left;   DIAGNOSTIC LAPAROSCOPY     DX LAPAROSCOPY  X2   ESOPHAGOGASTRODUODENOSCOPY  05/09/2007   EXPLORATORY LAPAROTOMY W/ BILATERAL SALPINGECTOMY AND REMOVAL RIGHT ECTOPIC PREG.  02/23/2004   EXTRACORPOREAL SHOCK WAVE LITHOTRIPSY  2001  &  2002   LAPAROSCOPIC CHOLECYSTECTOMY  1999   POLYPECTOMY     ROBOTIC ASSISTED LAPAROSCOPIC SACROCOLPOPEXY N/A 12/12/2022   Procedure: XI ROBOTIC ASSISTED LAPAROSCOPIC SACROCOLPOPEXY;  Surgeon: Brittany Rosaline SAILOR, MD;  Location: [facility];  Service: Gynecology;  Laterality: N/A;  Total time requested 3 hours -Assist requested   SHOULDER ARTHROSCOPY WITH OPEN ROTATOR CUFF REPAIR Right 2012   TOTAL ABDOMINAL HYSTERECTOMY W/ BILATERAL OOPHORECTOMY AND LYSIS ADHESIONS  09/02/2007   TUBAL LIGATION Bilateral 1995   UMBILICAL HERNIA REPAIR  2003   unsure if she has mesh   VAGINAL HYSTERECTOMY N/A 08/18/2015   Procedure: Exam under Anesthesia, Excision Xenform Graft, Removal Bilateral Sacrospinous Ligament sutures;  Surgeon: Brittany FORBES Cathlyn JAYSON Nikki, MD;  Location: [facility];  Service: Gynecology;  Laterality: N/A;     Current Meds  Medication Sig   ALPRAZolam  (XANAX ) 1 MG tablet Take 1 tablet (1 mg total) by mouth 2  (two) times daily as needed for anxiety.   aspirin  EC 81 MG tablet Take 1 tablet (81 mg total) by mouth daily. Swallow whole.   atorvastatin  (LIPITOR) 40 MG tablet Take 40 mg by mouth daily.   buPROPion  (WELLBUTRIN  SR) 150 MG 12 hr tablet Take 1 tablet (150 mg total) by mouth 2 (two) times daily.   cyanocobalamin  (VITAMIN B12) 500 MCG tablet 300 mcg- 500 mcg daily   metFORMIN  (GLUCOPHAGE ) 500 MG tablet 1 po with lunch and dinner daily   nitroGLYCERIN  (NITROSTAT ) 0.4 MG SL tablet Place 1 tablet (0.4 mg total) under the tongue every 5 (five) minutes as needed for chest pain. 3 doses max in a 24 hour period   traZODone  (DESYREL ) 50 MG tablet Take 1 tablet (50 mg total) by mouth at bedtime.   Vitamin D , Ergocalciferol , (DRISDOL ) 1.25 MG (50000 UNIT) CAPS capsule Take 1 capsule (50,000 Units total) by mouth every 14 (fourteen) days.     Allergies:   Desvenlafaxine and Prochlorperazine    Social History   Tobacco Use   Smoking status: Former    Current packs/day: 0.00    Types: Cigarettes    Start date: 05/16/1993    Quit  date: 05/16/2008    Years since quitting: 16.2   Smokeless tobacco: Never  Vaping Use   Vaping status: Never Used  Substance Use Topics   Alcohol use: No    Alcohol/week: 0.0 standard drinks of alcohol   Drug use: No     Family Hx: The patient's family history includes Anxiety disorder in her brother; Arrhythmia in her father and mother; Breast cancer in her paternal grandmother; Cancer in her paternal uncle; Colon polyps in her father and mother; Depression in her mother; Diabetes in her father; Drug abuse in her sister; Heart attack in her maternal grandmother; Heart disease in her father and mother; Hypertension in her mother; Kidney disease in her mother; Obesity in her mother; Pancreatic cancer in her paternal uncle. There is no history of Esophageal cancer, Stomach cancer, Rectal cancer, or Colon cancer.  ROS:   Please see the history of present illness.     All  other systems reviewed and are negative.   Prior CV studies:   The following studies were reviewed today:    Labs/Other Tests and Data Reviewed:    EKG:  No ECG reviewed.  Recent Labs: 01/20/2024: TSH 2.190 04/21/2024: Hemoglobin 12.9; Platelets 210 05/05/2024: ALT 18; BUN 17; Creatinine, Ser 0.83; Potassium 4.2; Sodium 141   Recent Lipid Panel Lab Results  Component Value Date/Time   CHOL 119 05/05/2024 09:15 AM   TRIG 53 05/05/2024 09:15 AM   HDL 48 05/05/2024 09:15 AM   CHOLHDL 2.5 05/05/2024 09:15 AM   CHOLHDL 3 09/04/2023 08:37 AM   LDLCALC 59 05/05/2024 09:15 AM   LDLDIRECT 118.0 12/28/2019 01:23 PM    Wt Readings from Last 3 Encounters:  07/27/24 164 lb (74.4 kg)  07/22/24 164 lb (74.4 kg)  07/08/24 164 lb (74.4 kg)     Objective:    Vital Signs:  BP 122/68 (BP Location: Right Arm, Patient Position: Sitting) Comment: REPORTED BY PT  Pulse 69 Comment: REPORTED BY PT  Ht 5' 7.2 (1.707 m)   Wt 164 lb (74.4 kg)   LMP 12/24/2006 (Approximate)   BMI 25.53 kg/m    VITAL SIGNS:  reviewed  ASSESSMENT & PLAN:    CAD: Calcium  score on 02/10/2024 was 843 (99th percentile).  Reports chest pain/dyspnea occurs when she is stressed.  Echocardiogram 05/07/2024 showed normal biventricular function, no significant valvular disease.  Stress PET was ordered at initial clinic visit but denied by insurance.  Exercise Myoview  07/20/2024 showed good exercise capacity (9.7 METS), stress ECG positive for ischemia, normal myocardial perfusion, LVEF 68%.  On my review of the stress ECG, it appears upsloping ST depressions, not consistent with ischemia.  Overall would classify as low risk stress test -Continue aspirin  81 mg daily -Continue atorvastatin  40 mg daily   Hyperlipidemia: Continue atorvastatin  40 mg daily, LDL 59 on 05/05/2024   OSA: encouraged compliance with CPAP   RTC in 1 year    Time:   Today, I have spent 11 minutes with the patient with telehealth technology  discussing the above problems.     Medication Adjustments/Labs and Tests Ordered: Current medicines are reviewed at length with the patient today.  Concerns regarding medicines are outlined above.   Tests Ordered: No orders of the defined types were placed in this encounter.   Medication Changes: No orders of the defined types were placed in this encounter.   Follow Up:  In Person in 1 year(s)  Signed, Brittany LITTIE Nanas, MD  07/27/2024 9:51 AM  [facility]

## 2024-07-27 ENCOUNTER — Ambulatory Visit: Attending: Cardiology | Admitting: Cardiology

## 2024-07-27 ENCOUNTER — Ambulatory Visit: Admitting: Cardiology

## 2024-07-27 ENCOUNTER — Telehealth: Payer: Self-pay | Admitting: *Deleted

## 2024-07-27 VITALS — BP 122/68 | HR 69 | Ht 67.2 in | Wt 164.0 lb

## 2024-07-27 DIAGNOSIS — E785 Hyperlipidemia, unspecified: Secondary | ICD-10-CM | POA: Diagnosis not present

## 2024-07-27 DIAGNOSIS — I2510 Atherosclerotic heart disease of native coronary artery without angina pectoris: Secondary | ICD-10-CM

## 2024-07-27 NOTE — Telephone Encounter (Signed)
  Patient Consent for Virtual Visit        [name] has provided verbal consent on 07/27/2024 for a virtual visit (video or telephone).   CONSENT FOR VIRTUAL VISIT FOR:  [name]  By participating in this virtual visit I agree to the following:  I hereby voluntarily request, consent and authorize [facility] and its employed or contracted physicians, physician assistants, nurse practitioners or other licensed health care professionals (the Practitioner), to provide me with telemedicine health care services (the "Services) as deemed necessary by the treating Practitioner. I acknowledge and consent to receive the Services by the Practitioner via telemedicine. I understand that the telemedicine visit will involve communicating with the Practitioner through live audiovisual communication technology and the disclosure of certain medical information by electronic transmission. I acknowledge that I have been given the opportunity to request an in-person assessment or other available alternative prior to the telemedicine visit and am voluntarily participating in the telemedicine visit.  I understand that I have the right to withhold or withdraw my consent to the use of telemedicine in the course of my care at any time, without affecting my right to future care or treatment, and that the Practitioner or I may terminate the telemedicine visit at any time. I understand that I have the right to inspect all information obtained and/or recorded in the course of the telemedicine visit and may receive copies of available information for a reasonable fee.  I understand that some of the potential risks of receiving the Services via telemedicine include:  Delay or interruption in medical evaluation due to technological equipment failure or disruption; Information transmitted may not be sufficient (e.g. poor resolution of images) to allow for appropriate medical decision making by the  Practitioner; and/or  In rare instances, security protocols could fail, causing a breach of personal health information.  Furthermore, I acknowledge that it is my responsibility to provide information about my medical history, conditions and care that is complete and accurate to the best of my ability. I acknowledge that Practitioner's advice, recommendations, and/or decision may be based on factors not within their control, such as incomplete or inaccurate data provided by me or distortions of diagnostic images or specimens that may result from electronic transmissions. I understand that the practice of medicine is not an exact science and that Practitioner makes no warranties or guarantees regarding treatment outcomes. I acknowledge that a copy of this consent can be made available to me via my patient portal [facility] MyChart), or I can request a printed copy by calling the office of [facility].    I understand that my insurance will be billed for this visit.   I have read or had this consent read to me. I understand the contents of this consent, which adequately explains the benefits and risks of the Services being provided via telemedicine.  I have been provided ample opportunity to ask questions regarding this consent and the Services and have had my questions answered to my satisfaction. I give my informed consent for the services to be provided through the use of telemedicine in my medical care

## 2024-07-27 NOTE — Patient Instructions (Signed)
 Medication Instructions:  Continue current medications *If you need a refill on your cardiac medications before your next appointment, please call your pharmacy*  Lab Work: none If you have labs (blood work) drawn today and your tests are completely normal, you will receive your results only by: MyChart Message (if you have MyChart) OR A paper copy in the mail If you have any lab test that is abnormal or we need to change your treatment, we will call you to review the results.  Testing/Procedures: none  Follow-Up: At [facility], you and your health needs are our priority.  As part of our continuing mission to provide you with exceptional heart care, our providers are all part of one team.  This team includes your primary Cardiologist (physician) and Advanced Practice Providers or APPs (Physician Assistants and Nurse Practitioners) who all work together to provide you with the care you need, when you need it.  Your next appointment:   1 year(s)  Provider:   Wendie Hamburg, MD    We recommend signing up for the patient portal called "MyChart".  Sign up information is provided on this After Visit Summary.  MyChart is used to connect with patients for Virtual Visits (Telemedicine).  Patients are able to view lab/test results, encounter notes, upcoming appointments, etc.  Non-urgent messages can be sent to your provider as well.   To learn more about what you can do with MyChart, go to [email or web address].   Other Instructions none

## 2024-07-28 ENCOUNTER — Ambulatory Visit (INDEPENDENT_AMBULATORY_CARE_PROVIDER_SITE_OTHER): Admitting: Psychology

## 2024-07-28 DIAGNOSIS — F4321 Adjustment disorder with depressed mood: Secondary | ICD-10-CM | POA: Diagnosis not present

## 2024-07-28 DIAGNOSIS — F411 Generalized anxiety disorder: Secondary | ICD-10-CM | POA: Diagnosis not present

## 2024-07-28 NOTE — Progress Notes (Signed)
  Behavioral Health Counselor/Therapist Progress Note  Patient ID: [name], MRN: [id number],    Date: 07/28/24   Time Spent: 12:01pm-12:45pm  Treatment Type: Individual Therapy  Pt is seen for a virtual video visit via caregility. Pt consents to virtual visit and is aware of limitations of virtual visits.  Pt joins from her home, reporting privacy, and counselor from her home office.     Reported Symptoms: pt reports positive of results from stress test.  Pt reports asserting boundaries w/ saying no.      Mental Status Exam: Appearance:  Well Groomed     Behavior: Appropriate  Motor: Normal  Speech/Language:  Normal Rate  Affect: Appropriate  Mood: anxious  Thought process: normal  Thought content:   WNL  Sensory/Perceptual disturbances:   WNL  Orientation: oriented to person, place, time/date, and situation  Attention: Good  Concentration: Good  Memory: WNL  Fund of knowledge:  Good  Insight:   Good  Judgment:  Good  Impulse Control: Good   Risk Assessment: Danger to Self:  No Self-injurious Behavior: No Danger to Others: No Duty to Warn:no Physical Aggression / Violence:No  Access to Firearms a concern: No  Gang Involvement:No   Subjective: Counselor assessed pt current functioning per pt report.  Processed w/pt positives and stressors.  Discussed stressors and how managing time and commitments for self care.  Reflected positives of plan for maintaining schedule that gives time for morning exercise or relaxation practices.  Explored saying no and related guilt. Assisted in recognizing distortions related and how to reframe.  Pt affect wnl.  Pt reports positive results from stress test and cardiologist informing low risk at this time.  Pt reports feels good about progress made w/ changes.  Pt reflects on need for making time for exercise/to be active.  Pt discussed grandkids going back to school has th opportunity to use morning routine for self.   Pt discussed how she is saying no more and not trying to take on others responsibilities at compromise of her own.  Pt discussed some guilt and related thoughts that can identify as distortions.  Pt is able to reframe.   Interventions: Cognitive Behavioral Therapy, Mindfulness Meditation, and supportive  Diagnosis:Generalized anxiety disorder  Grief  Plan: Pt to f/u w/ Counseling in 3 weeks.  Pt to f/u w/ PCP, Cardiologist and [facility] as scheduled.   Individualized Treatment Plan Strengths: enjoys walking and movement, enjoys the beach, family is important to her  Supports: her husband, her daughter in law and her mom    Goal/Needs for Treatment:  In order of importance to patient 1) cope with anxiety 2) increase self worth and decrease negative thought patterns.  3) continue cope through losses       Client Statement of Needs:   talk about my grief, continue to work through my anxiety, try not be so negative on stuff and thinking things are going to go bad or wrong.  Finding time for self again.        Treatment Level: outpatient counseling  Symptoms:anxiety, worry, ruminating thoughts, low self worth, emotional escalations, depressed mood, sleep disturbance  Client Treatment Preferences: counseling every 2-4 weeks and continue medication management w/ PCP    Healthcare consumer's goal for treatment:   Counselor, Damien Herald, [facility] will support the patient's ability to achieve the goals identified. Cognitive Behavioral Therapy, Assertive Communication/Conflict Resolution Training, Relaxation Training, ACT, Humanistic and other evidenced-based practices will be used  to promote progress towards healthy functioning.    Healthcare consumer will: Actively participate in therapy, working towards healthy functioning.     *Justification for Continuation/Discontinuation of Goal: R=Revised, O=Ongoing, A=Achieved, D=Discontinued   Goal 1) Pt will increase coping with  life's anxiety and stressors AEB decreased ruminating worry, decreased emotional escalations, decreased negative thought patterns. That contribute to anxiety. Baseline date 03/10/24: Progress towards goal 40%; How Often - Daily Target Date Goal Was reviewed Status Code Progress towards goal  03/10/25                            Goal 2)  Increase self awareness, consistency w/ self care time and self compassion statements AEB pt report and therapist observation.   Baseline date 03/10/24: Progress towards goal 30; How Often - Daily Target Date Goal Was reviewed Status Code Progress towards goal/Likert rating  03/10/25                            Goal 3) Verbalize feelings of grief related to losses and recognize ways of living with grief present.  Baseline date 03/10/24: Progress towards goal 30; How Often - Daily Target Date Goal Was reviewed Status Code Progress towards goal/Likert rating  03/10/25                            This plan has been reviewed and created by the following participants:  This plan will be reviewed at least every 12 months. Date Behavioral Health Clinician Date Guardian/Patient   03/10/24 [facility]                      03/10/24 Verbal consent provided and request for electronic signature sent and obtained           [name] [facility]

## 2024-08-03 ENCOUNTER — Telehealth (INDEPENDENT_AMBULATORY_CARE_PROVIDER_SITE_OTHER): Admitting: Psychology

## 2024-08-03 DIAGNOSIS — F5089 Other specified eating disorder: Secondary | ICD-10-CM

## 2024-08-03 DIAGNOSIS — F419 Anxiety disorder, unspecified: Secondary | ICD-10-CM | POA: Diagnosis not present

## 2024-08-03 NOTE — Progress Notes (Signed)
  Office: [phone]  /  Fax: [phone]    Date: August 03, 2024  Appointment Start Time: 12:05pm Duration: 34 minutes Provider: Wyatt Fire, Psy.D. Type of Session: Individual Therapy  Location of Patient: Home (private location) Location of Provider: Provider's Home (private office) Type of Contact: Telepsychological Visit via MyChart Video Visit  Session Content: [name] is a 58 y.o. female presenting for a follow-up appointment to address the previously established treatment goal of increasing coping skills. Today's appointment was a telepsychological visit. [name] provided verbal consent for today's telepsychological appointment and she is aware she is responsible for securing confidentiality on her end of the session. Prior to proceeding with today's appointment, [name]'s physical location at the time of this appointment was obtained as well a phone number she could be reached at in the event of technical difficulties. [name] and this provider participated in today's telepsychological service.   This provider conducted a brief check-in. [name] reported implementing previously discussed mindful eating strategies. She also shared about her recent trip to [place]. Explored what went well as it relates to eating habits while she was away and what she would do differently during future trips. Discussed and processed her last appointment with Dr. Midge (07/22/2024), as the OV note indicated body fat % has increased by 8.1%. Explored protein intake and physical activity due to the muscle mass decrease. Regarding protein intake, she acknowledged she is not always consuming all recommended protein. She was agreeable to making deli meat roll ups as snacks to increase protein intake. Per the OV note with Dr. Midge on 07/22/2024, [name] has been advised to work up to 300-450 minutes of moderate intensity aerobic activity a week and strengthening exercises 2-3 times per week for cardiovascular  health, weight loss maintenance and preservation of muscle mass. Given recent challenges with incorporating recommended physical activity, session focused on increasing NEAT. Overall, [name] was receptive to today's appointment as evidenced by openness to sharing, responsiveness to feedback, and willingness to implement discussed strategies .  Mental Status Examination:  Appearance: neat Behavior: appropriate to circumstances Mood: neutral Affect: mood congruent Speech: normal in rate, volume, and tone Eye Contact: appropriate Psychomotor Activity: WNL Gait: unable to assess Thought Process: linear, logical, and goal directed and no evidence or endorsement of suicidal, homicidal, and self-harm ideation, plan and intent  Thought Content/Perception: no hallucinations, delusions, bizarre thinking or behavior endorsed or observed Orientation: AAOx4 Memory/Concentration: intact Insight: good Judgment: good  Interventions:  Conducted a brief chart review Provided empathic reflections and validation Reviewed content from the previous session Provided positive reinforcement Employed supportive psychotherapy interventions to facilitate reduced distress and to improve coping skills with identified stressors Engaged patient in problem solving  DSM-5 Diagnosis(es): F50.89 Other Specified Feeding or Eating Disorder, Emotional Eating Behaviors and F41.9 Unspecified Anxiety Disorder  Treatment Goal & Progress: During the initial appointment with this provider, the following treatment goal was established: increase coping skills. [name] has demonstrated progress in her goal as evidenced by increased awareness of hunger patterns and increased awareness of triggers for emotional eating behaviors. [name] also continues to demonstrate willingness to engage in learned skills (e.g., urge surfing, mindful eating strategies).    Plan: The next appointment is scheduled for 08/18/2024 at 8am, which will be via  MyChart Video Visit. The next session will focus on termination planning. [name] will continue with her primary therapist.    Wyatt Fire, PsyD

## 2024-08-05 ENCOUNTER — Ambulatory Visit (INDEPENDENT_AMBULATORY_CARE_PROVIDER_SITE_OTHER): Admitting: Family Medicine

## 2024-08-06 ENCOUNTER — Encounter (INDEPENDENT_AMBULATORY_CARE_PROVIDER_SITE_OTHER): Payer: Self-pay | Admitting: Family Medicine

## 2024-08-06 ENCOUNTER — Ambulatory Visit (INDEPENDENT_AMBULATORY_CARE_PROVIDER_SITE_OTHER): Admitting: Family Medicine

## 2024-08-06 VITALS — BP 110/71 | HR 60 | Temp 98.0°F | Ht 66.0 in | Wt 164.0 lb

## 2024-08-06 DIAGNOSIS — R7303 Prediabetes: Secondary | ICD-10-CM | POA: Diagnosis not present

## 2024-08-06 DIAGNOSIS — E559 Vitamin D deficiency, unspecified: Secondary | ICD-10-CM | POA: Diagnosis not present

## 2024-08-06 DIAGNOSIS — Z6827 Body mass index (BMI) 27.0-27.9, adult: Secondary | ICD-10-CM

## 2024-08-06 DIAGNOSIS — E669 Obesity, unspecified: Secondary | ICD-10-CM | POA: Diagnosis not present

## 2024-08-06 DIAGNOSIS — F5089 Other specified eating disorder: Secondary | ICD-10-CM | POA: Diagnosis not present

## 2024-08-06 MED ORDER — VITAMIN D (ERGOCALCIFEROL) 1.25 MG (50000 UNIT) PO CAPS: 50000.0000 [IU] | ORAL_CAPSULE | ORAL | 0 refills | Status: DC

## 2024-08-06 MED ORDER — BUPROPION HCL ER (SR) 150 MG PO TB12: 150.0000 mg | ORAL_TABLET | Freq: Two times a day (BID) | ORAL | 0 refills | Status: DC

## 2024-08-06 MED ORDER — METFORMIN HCL 500 MG PO TABS: ORAL_TABLET | ORAL | 0 refills | Status: DC

## 2024-08-06 NOTE — Progress Notes (Signed)
 [name], D.O.  ABFM, ABOM Specializing in Clinical Bariatric Medicine  Office located at: [address] [place], [place]  [address]     Assessment and Plan:   Medications Discontinued During This Encounter  Medication Reason   Vitamin D , Ergocalciferol , (DRISDOL ) 1.25 MG (50000 UNIT) CAPS capsule Reorder   buPROPion  (WELLBUTRIN  SR) 150 MG 12 hr tablet Reorder   metFORMIN  (GLUCOPHAGE ) 500 MG tablet Reorder    Meds ordered this encounter  Medications   metFORMIN  (GLUCOPHAGE ) 500 MG tablet    Sig: 1 po with lunch and dinner daily    Dispense:  60 tablet    Refill:  0    30 d supply;  ** OV for RF **   Do not send RF request   buPROPion  (WELLBUTRIN  SR) 150 MG 12 hr tablet    Sig: Take 1 tablet (150 mg total) by mouth 2 (two) times daily.    Dispense:  60 tablet    Refill:  0   Vitamin D , Ergocalciferol , (DRISDOL ) 1.25 MG (50000 UNIT) CAPS capsule    Sig: Take 1 capsule (50,000 Units total) by mouth every 14 (fourteen) days.    Dispense:  6 capsule    Refill:  0     FOR THE DISEASE OF OBESITY:  Obesity (BMI 30-39.9) - Starting BMI 30.51 BMI 27.0-27.9,adult -- Current BMI 27.13 Assessment & Plan: Since last office visit on 07/22/24 patient's muscle mass has increased by 13 lbs. Fat mass has decreased by 13.9 lbs. Total body water  has decreased by 1 lbs.  Body fat % has decreased by 8.5 %. Counseling done on how various foods will affect these numbers and how to maximize success  Total lbs lost to date: 25 lbs Total weight loss percentage to date: -13.23 %   Recommended Dietary Goals [name] is currently in the action stage of change. As such, her goal is to continue weight management plan.  She has agreed to: continue current plan   Behavioral Intervention We discussed the following today: increasing lean protein intake to established goals, decreasing simple carbohydrates , increasing vegetables, increasing lower glycemic fruits, and focusing on food with  a 10:1 ratio of calories: grams of protein, Strategizing with eating healthier alternatives to add variety, decreasing high glycemic fruits, portion sizes,balanced plate concepts and plate portions, and concept of smart choices, identifying healthy lean protein sources, reviewed generalized tips for healthy eating and lean protein sources handout.    Additional resources provided today:  Handout on [employer] and Handout on Healthy Tuna Salad Recipe.  Evidence-based interventions for health behavior change were utilized today including the discussion of self monitoring techniques, problem-solving barriers and SMART goal setting techniques.   Regarding patient's less desirable eating habits and patterns, we employed the technique of small changes.   Pt will specifically work on: Increasing whole foods and meeting her protein goal daily.    Recommended Physical Activity Goals [name] has been advised to work up to 300-450 minutes of moderate intensity aerobic activity a week and strengthening exercises 2-3 times per week for cardiovascular health, weight loss maintenance and preservation of muscle mass.   Increase physical activity in their day and reduce sedentary time (increase NEAT). Resume past exercise routine.    Pharmacotherapy We both agreed to: Continue with current nutritional and behavioral strategies and continue medications that aid in wt loss (Metformin  and Wellbutrin ).    ASSOCIATED CONDITIONS ADDRESSED TODAY:  Other Specified Feeding or Eating Disorder, Emotional  Eating Behaviors Assessment & Plan: Increased Wellbutrin  at LOV. Currently compliant with Wellbutrin  SR 150 mg BID. Pt admits to not meeting her protein intake and not eating recommended portion of food at each meal. Hunger/cravings well controlled. Endorses some GI issues when first starting. Pt reports one episode of fatigue, sluggishness, and hangover-like feeling. Of note, this is similar to a  prior episode during decrease food intake following her father's passing about 1 year ago.  Discussed how her episode of fatigue may have been a result of decreased food intake. Emphasized eating recommended food amounts at each meal and avoid skipping meals. Reminded pt Wellbutrin  helps decrease food noise. Continue to follow up with Dr. Sharron for support with emotional eating. Continue with current med regimen. Will refill Wellbutrin , no dose changes.     Prediabetes Assessment & Plan: Lab Results  Component Value Date   HGBA1C 5.7 (H) 05/05/2024   HGBA1C 5.5 01/20/2024   HGBA1C 5.7 09/04/2023   INSULIN  4.8 05/05/2024   INSULIN  11.2 01/20/2024    Increased Metformin  at LOV. Currently compliant with Metformin  500 mg BID. Admits to not eating enough protein and eating whole foods. Pt reports one episode of fatigue, sluggishness, and hangover-like feeling. Of note, this is similar to a prior episode during decrease food intake following her father's passing about 1 year ago.  Discussed how her episode of fatigue may have been a result of decreased food intake. Emphasized eating recommended food amounts at each meal and avoid skipping meals. Encouraged pt to eat more smaller meals throughout the day if needed, every 3-4 hours. Reviewed how decreased food intake effects glycemic control and metabolism/weight loss. Reminded pt of clinical benefits of Metformin  including better control with hunger/cravings. Stay properly hydrated by drinking 1/2 her body weight in ounces of water  per day. Continue Metformin  at current dose. Will refill Metformin  today.     Vitamin D  deficiency Lab Results  Component Value Date   VD25OH 70.3 05/05/2024   VD25OH 33.4 01/20/2024   VD25OH 37.16 04/04/2020   Currently on ERGO every 14 days. Good compliance and tolerance. No adverse side effects or acute concerns.   Continue with current supplementation as prescribed. Will refill ERGO, no dose changes. Will  continue monitoring levels periodically.     Follow up:   Return in about 4 weeks (around 09/03/2024) for f/u on 09/08/2024 at 8:20 AM. Make additional f/u in 4 weeks.  She was informed of the importance of frequent follow up visits to maximize her success with intensive lifestyle modifications for her multiple health conditions.  Subjective:   Chief complaint: Obesity [name] is here to discuss her progress with her obesity treatment plan. She is on the Category 1 Plan and states she is following her eating plan approximately 60% of the time. She states she is not exercising.   Interval History:  [name] is here for a follow up office visit. Since last OV on 07/22/24, she maintained her weight. She reports struggling with eating her whole foods and veggies. She is not sleeping 7-9 hours/night.  Although she is not eating all her servings of fruits/veggies as recommended, she reports enjoying eating strawberries. She also enjoys eating pasta and expressed interested in finding healthier alternatives to pasta and pasta sauces that would still be on-plan.    Pharmacotherapy that aid with weight loss: She is currently taking Wellbutrin  SR 150 mg BID and Metformin  500 mg BID.    Review of Systems:  Pertinent positives were addressed  with patient today.  Reviewed by clinician on day of visit: allergies, medications, problem list, medical history, surgical history, family history, social history, and previous encounter notes.  Weight Summary and Biometrics   Weight Lost Since Last Visit: 0  Weight Gained Since Last Visit: 0    Vitals Temp: 98 F (36.7 C) BP: 110/71 Pulse Rate: 60 SpO2: 98 %   Anthropometric Measurements Height: 5' 6 (1.676 m) Weight: 164 lb (74.4 kg) BMI (Calculated): 26.48 Weight at Last Visit: 164lb Weight Lost Since Last Visit: 0 Weight Gained Since Last Visit: 0 Starting Weight: 189lb Total Weight Loss (lbs): 25 lb (11.3 kg) Peak Weight:  250lb Waist Measurement : 36.5 inches   Body Composition  Body Fat %: 26.8 % Fat Mass (lbs): 44 lbs Muscle Mass (lbs): 114 lbs Total Body Water  (lbs): 68.8 lbs Visceral Fat Rating : 6   Other Clinical Data Fasting: yes Labs: no Today's Visit #: 12 Starting Date: 01/20/24    Objective:   PHYSICAL EXAM: Blood pressure 110/71, pulse 60, temperature 98 F (36.7 C), height 5' 6 (1.676 m), weight 164 lb (74.4 kg), last menstrual period 12/24/2006, SpO2 98%. Body mass index is 26.47 kg/m.  General: she is overweight, cooperative and in no acute distress. PSYCH: Has normal mood, affect and thought process.   HEENT: EOMI, sclerae are anicteric. Lungs: Normal breathing effort, no conversational dyspnea. Extremities: Moves * 4 Neurologic: A and O * 3, good insight  DIAGNOSTIC DATA REVIEWED: BMET    Component Value Date/Time   NA 141 05/05/2024 0915   K 4.2 05/05/2024 0915   CL 103 05/05/2024 0915   CO2 23 05/05/2024 0915   GLUCOSE 83 05/05/2024 0915   GLUCOSE 90 04/21/2024 2310   BUN 17 05/05/2024 0915   CREATININE 0.83 05/05/2024 0915   CALCIUM  9.5 05/05/2024 0915   GFRNONAA >60 04/21/2024 2310   GFRAA >60 11/15/2017 2142   Lab Results  Component Value Date   HGBA1C 5.7 (H) 05/05/2024   HGBA1C 5.6 10/17/2015   Lab Results  Component Value Date   INSULIN  4.8 05/05/2024   INSULIN  11.2 01/20/2024   Lab Results  Component Value Date   TSH 2.190 01/20/2024   CBC    Component Value Date/Time   WBC 6.7 04/21/2024 2310   RBC 4.50 04/21/2024 2310   HGB 12.9 04/21/2024 2310   HGB 14.3 01/20/2024 0953   HCT 38.7 04/21/2024 2310   HCT 42.3 01/20/2024 0953   PLT 210 04/21/2024 2310   PLT 253 01/20/2024 0953   MCV 86.0 04/21/2024 2310   MCV 87 01/20/2024 0953   MCH 28.7 04/21/2024 2310   MCHC 33.3 04/21/2024 2310   RDW 13.2 04/21/2024 2310   RDW 12.8 01/20/2024 0953   Iron Studies    Component Value Date/Time   IRON 89 11/14/2022 1321   TIBC 326.2  11/14/2022 1321   FERRITIN 87.8 11/14/2022 1321   IRONPCTSAT 27.3 11/14/2022 1321   Lipid Panel     Component Value Date/Time   CHOL 119 05/05/2024 0915   TRIG 53 05/05/2024 0915   HDL 48 05/05/2024 0915   CHOLHDL 2.5 05/05/2024 0915   CHOLHDL 3 09/04/2023 0837   VLDL 17.0 09/04/2023 0837   LDLCALC 59 05/05/2024 0915   LDLDIRECT 118.0 12/28/2019 1323   Hepatic Function Panel     Component Value Date/Time   PROT 6.7 05/05/2024 0915   ALBUMIN 4.3 05/05/2024 0915   AST 15 05/05/2024 0915   ALT 18 05/05/2024 0915  ALKPHOS 106 05/05/2024 0915   BILITOT 0.4 05/05/2024 0915   BILIDIR 0.1 09/04/2023 0837      Component Value Date/Time   TSH 2.190 01/20/2024 0953   Nutritional Lab Results  Component Value Date   VD25OH 70.3 05/05/2024   VD25OH 33.4 01/20/2024   VD25OH 37.16 04/04/2020    Attestations:   I, Vernell Forest, acting as a medical scribe for Brittany Jenkins, DO., have compiled all relevant documentation for today's office visit on behalf of Brittany Jenkins, DO, while in the presence of [employer], DO.  I have reviewed the above documentation for accuracy and completeness, and I agree with the above. Brittany [name], D.O.  The 21st Century Cures Act was signed into law in 2016 which includes the topic of electronic health records.  This provides immediate access to information in MyChart.  This includes consultation notes, operative notes, office notes, lab results and pathology reports.  If you have any questions about what you read please let us  know at your next visit so we can discuss your concerns and take corrective action if need be.  We are right here with you.

## 2024-08-18 ENCOUNTER — Ambulatory Visit (INDEPENDENT_AMBULATORY_CARE_PROVIDER_SITE_OTHER): Admitting: Psychology

## 2024-08-18 ENCOUNTER — Telehealth (INDEPENDENT_AMBULATORY_CARE_PROVIDER_SITE_OTHER): Admitting: Psychology

## 2024-08-18 DIAGNOSIS — F4321 Adjustment disorder with depressed mood: Secondary | ICD-10-CM | POA: Diagnosis not present

## 2024-08-18 DIAGNOSIS — F411 Generalized anxiety disorder: Secondary | ICD-10-CM

## 2024-08-18 NOTE — Progress Notes (Signed)
 [facility] Counselor/Therapist Progress Note  Patient ID: [name], MRN: [id number],    Date: 08/18/24   Time Spent: 12:01pm-12:54pm  Treatment Type: Individual Therapy  Pt is seen for a virtual video visit via caregility. Pt consents to virtual visit and is aware of limitations of virtual visits.  Pt joins from her home, reporting privacy, and counselor from her home office.     Reported Symptoms: pt reports feeling anxious.  Pt denies worries.  Pt able to gain insight re: triggers.   Mental Status Exam: Appearance:  Well Groomed     Behavior: Appropriate  Motor: Normal  Speech/Language:  Normal Rate  Affect: Appropriate  Mood: anxious  Thought process: normal  Thought content:   WNL  Sensory/Perceptual disturbances:   WNL  Orientation: oriented to person, place, time/date, and situation  Attention: Good  Concentration: Good  Memory: WNL  Fund of knowledge:  Good  Insight:   Good  Judgment:  Good  Impulse Control: Good   Risk Assessment: Danger to Self:  No Self-injurious Behavior: No Danger to Others: No Duty to Warn:no Physical Aggression / Violence:No  Access to Firearms a concern: No  Gang Involvement:No   Subjective: Counselor assessed pt current functioning per pt report.  Processed w/pt awareness of anxious feeling in her system and how creating anxious thoughts.  Explored w/pt potential triggers w/ transition home form vacation w/ family.  Encouraged pt use of coping skills to ground and acknowledge psychosomatic response doesn't mean that unsafe or something bad/negative to worry about.  Explored stressors w/ work and [employer] that supporting.    Pt affect wnl.  Pt reports this morning- anxious feelings in her body and causing concern as is there something to going on.  Pt is able to reflect that return from vacation and separating from son and his family could be impacting.  Pt is able to reframe and acknowledge ways of  grounding.  Pt feels good about insights for self and reframing.  Pt reports on positive of vacation to beach and plan to return for christmas to celebrate together.  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  Pt focused on acknowledged what her limitations are.  Pt discussed upcoming anniversary of father's death.  Pt discussed connecting w/ stepmom and that may have been some unconscious avoidance.    Interventions: Cognitive Behavioral Therapy, Mindfulness Meditation, and supportive  Diagnosis:Generalized anxiety disorder  Grief  Plan: Pt to f/u w/ Counseling in 3 weeks.  Pt to f/u w/ PCP, Cardiologist and [facility] as scheduled.   Individualized Treatment Plan Strengths: enjoys walking and movement, enjoys the beach, family is important to her  Supports: her husband, her daughter in law and her mom    Goal/Needs for Treatment:  In order of importance to patient 1) cope with anxiety 2) increase self worth and decrease negative thought patterns.  3) continue cope through losses       Client Statement of Needs:   talk about my grief, continue to work through my anxiety, try not be so negative on stuff and thinking things are going to go bad or wrong.  Finding time for self again.        Treatment Level: outpatient counseling  Symptoms:anxiety, worry, ruminating thoughts, low self worth, emotional escalations, depressed mood, sleep disturbance  Client Treatment Preferences: counseling every 2-4 weeks and continue medication management w/ PCP  Healthcare consumer's goal for treatment:   Counselor, Damien Herald, [facility] will support the patient's ability to achieve the goals identified. Cognitive Behavioral Therapy, Assertive Communication/Conflict Resolution Training, Relaxation Training, ACT, Humanistic and other evidenced-based practices will be used to promote progress towards healthy functioning.     Healthcare consumer will: Actively participate in therapy, working towards healthy functioning.     *Justification for Continuation/Discontinuation of Goal: R=Revised, O=Ongoing, A=Achieved, D=Discontinued   Goal 1) Pt will increase coping with life's anxiety and stressors AEB decreased ruminating worry, decreased emotional escalations, decreased negative thought patterns. That contribute to anxiety. Baseline date 03/10/24: Progress towards goal 40%; How Often - Daily Target Date Goal Was reviewed Status Code Progress towards goal  03/10/25                            Goal 2)  Increase self awareness, consistency w/ self care time and self compassion statements AEB pt report and therapist observation.   Baseline date 03/10/24: Progress towards goal 30; How Often - Daily Target Date Goal Was reviewed Status Code Progress towards goal/Likert rating  03/10/25                            Goal 3) Verbalize feelings of grief related to losses and recognize ways of living with grief present.  Baseline date 03/10/24: Progress towards goal 30; How Often - Daily Target Date Goal Was reviewed Status Code Progress towards goal/Likert rating  03/10/25                            This plan has been reviewed and created by the following participants:  This plan will be reviewed at least every 12 months. Date Behavioral Health Clinician Date Guardian/Patient   03/10/24 [facility] Herald [facility]                      03/10/24 Verbal consent provided and request for electronic signature sent and obtained             HERALD DAMIEN [facility]

## 2024-08-20 ENCOUNTER — Ambulatory Visit (INDEPENDENT_AMBULATORY_CARE_PROVIDER_SITE_OTHER): Admitting: Family Medicine

## 2024-08-25 ENCOUNTER — Telehealth: Payer: Self-pay | Admitting: Sleep Medicine

## 2024-08-25 NOTE — Telephone Encounter (Signed)
 Copied from CRM [id number]. Topic: General - Other >> Aug 21, 2024 12:32 PM Shona S wrote: Reason for CRM: [employer] is calling because they sent a fax over for new CPAP supplies for the patient and they said it was denied. Dr Jess is patient sleep dr and last visit was 05/25/24 for CPAP compliance. [employer] is sending over new request for CPAP supplies and need it to be sent back.

## 2024-08-27 ENCOUNTER — Ambulatory Visit: Admitting: Sleep Medicine

## 2024-09-01 ENCOUNTER — Other Ambulatory Visit (INDEPENDENT_AMBULATORY_CARE_PROVIDER_SITE_OTHER): Payer: Self-pay | Admitting: Family Medicine

## 2024-09-01 DIAGNOSIS — F5089 Other specified eating disorder: Secondary | ICD-10-CM

## 2024-09-07 ENCOUNTER — Ambulatory Visit: Admitting: Sleep Medicine

## 2024-09-07 ENCOUNTER — Encounter: Payer: Self-pay | Admitting: Sleep Medicine

## 2024-09-07 ENCOUNTER — Telehealth (INDEPENDENT_AMBULATORY_CARE_PROVIDER_SITE_OTHER): Admitting: Psychology

## 2024-09-07 VITALS — BP 120/80 | HR 58 | Temp 98.1°F | Ht 66.0 in | Wt 166.4 lb

## 2024-09-07 DIAGNOSIS — G4733 Obstructive sleep apnea (adult) (pediatric): Secondary | ICD-10-CM

## 2024-09-07 DIAGNOSIS — F5089 Other specified eating disorder: Secondary | ICD-10-CM | POA: Diagnosis not present

## 2024-09-07 DIAGNOSIS — F419 Anxiety disorder, unspecified: Secondary | ICD-10-CM

## 2024-09-07 DIAGNOSIS — Z87891 Personal history of nicotine dependence: Secondary | ICD-10-CM

## 2024-09-07 NOTE — Progress Notes (Signed)
 Name:[name] MRN: [id number] DOB: July 20, 1966   CHIEF COMPLAINT:  CPAP F/U   HISTORY OF PRESENT ILLNESS: [name] is a 58 y.o. w/ a h/o OSA, ADHD and RLS who presents for CPAP follow up visit. Reports difficulty using CPAP therapy due to nasal dryness. States that air is very dry despite using the humidifier. She is currently using the Airfit N30i nasal mask, which is comfortable. Denies any other complaints.    EPWORTH SLEEP SCORE    02/14/2024    8:38 AM  Results of the Epworth flowsheet  Sitting and reading 2  Watching TV 2  Sitting, inactive in a public place (e.g. a theatre or a meeting) 0  As a passenger in a car for an hour without a break 3  Lying down to rest in the afternoon when circumstances permit 1  Sitting and talking to someone 0  Sitting quietly after a lunch without alcohol 0  In a car, while stopped for a few minutes in traffic 0  Total score 8    PAST MEDICAL HISTORY :   has a past medical history of ADHD, Allergy, Anxiety (2014), Back pain, CAD (coronary artery disease), Constipated, Depression, Fatty liver, GERD (gastroesophageal reflux disease) (2000), History of abnormal cervical Pap smear, History of colon polyps, Hydronephrosis, left, Hyperlipidemia (2024), Kidney stones (2016), OSA (obstructive sleep apnea), PONV (postoperative nausea and vomiting), RLS (restless legs syndrome), Rosacea, and Vitamin D  deficiency.  has a past surgical history that includes Laparoscopic cholecystectomy (1999); Tubal ligation (Bilateral, 1995); Cystoscopy w/ ureteral stent placement (01/1999); Extracorporeal shock wave lithotripsy (2001  &  2002); DX LAPAROSCOPY (X2); EXPLORATORY LAPAROTOMY W/ BILATERAL SALPINGECTOMY AND REMOVAL RIGHT ECTOPIC PREG. (02/23/2004); TOTAL ABDOMINAL HYSTERECTOMY W/ BILATERAL OOPHORECTOMY AND LYSIS ADHESIONS (09/02/2007); Esophagogastroduodenoscopy (05/09/2007); Umbilical hernia repair (2003); Shoulder arthroscopy with open  rotator cuff repair (Right, 2012); Cystoscopy with ureteroscopy and stent placement (Left, 05/18/2015); Cystoscopy with holmium laser lithotripsy (Left, 05/18/2015); Cystoscopy with retrograde pyelogram, ureteroscopy and stent placement (Left, 06/22/2015); Cystoscopy w/ retrogrades (Bilateral, 06/22/2015); Abdominal hysterectomy (2008); Diagnostic laparoscopy; Anterior and posterior repair with sacrospinous fixation (N/A, 08/16/2015); Bladder suspension (N/A, 08/16/2015); Cystoscopy (N/A, 08/16/2015); Vaginal hysterectomy (N/A, 08/18/2015); Polypectomy; Colonoscopy (08/21/2017); Robotic assisted laparoscopic sacrocolpopexy (N/A, 12/12/2022); Cystoscopy (N/A, 12/12/2022); Cystocele repair (N/A, 12/12/2022); and Cholecystectomy (1999). Prior to Admission medications   Medication Sig Start Date End Date Taking? Authorizing Provider  ALPRAZolam  (XANAX ) 1 MG tablet Take 1 tablet (1 mg total) by mouth 2 (two) times daily as needed for anxiety. 07/03/23  Yes Johnny Garnette LABOR, MD  aspirin  EC 81 MG tablet Take 1 tablet (81 mg total) by mouth daily. Swallow whole. 02/13/24  Yes Johnny Garnette LABOR, MD  atorvastatin  (LIPITOR) 40 MG tablet Take 40 mg by mouth daily.   Yes [provider]  buPROPion  ER (WELLBUTRIN  SR) 100 MG 12 hr tablet Take 100 mg by mouth 2 (two) times daily. 08/29/24  Yes [provider]  cyanocobalamin  (VITAMIN B12) 500 MCG tablet 300 mcg- 500 mcg daily 07/08/24  Yes Opalski, Barnie, DO  metFORMIN  (GLUCOPHAGE ) 500 MG tablet 1 po with lunch and dinner daily 08/06/24  Yes Opalski, Barnie, DO  nitroGLYCERIN  (NITROSTAT ) 0.4 MG SL tablet Place 1 tablet (0.4 mg total) under the tongue every 5 (five) minutes as needed for chest pain. 3 doses max in a 24 hour period 03/27/24 09/07/24 Yes Kate Lonni CROME, MD  traZODone  (DESYREL ) 50 MG tablet Take 1 tablet (50 mg total) by mouth at bedtime.  05/25/24  Yes Imanii Gosdin D, MD  Vitamin D , Ergocalciferol , (DRISDOL ) 1.25 MG (50000 UNIT) CAPS capsule  Take 1 capsule (50,000 Units total) by mouth every 14 (fourteen) days. 08/06/24  Yes Opalski, Barnie, DO   Allergies  Allergen Reactions   Desvenlafaxine Other (See Comments)    Reaction:  Withdrawal    Prochlorperazine  Other (See Comments)    Altered mental status    FAMILY HISTORY:  family history includes Anxiety disorder in her brother; Arrhythmia in her father and mother; Breast cancer in her paternal grandmother; Cancer in her paternal uncle and paternal uncle; Colon polyps in her father and mother; Depression in her mother; Diabetes in her father; Drug abuse in her sister and sister; Early death in her brother, maternal grandmother, and sister; Heart attack in her maternal grandmother; Heart disease in her father and mother; Hypertension in her mother; Kidney disease in her mother; Obesity in her mother; Pancreatic cancer in her paternal uncle. SOCIAL HISTORY:  reports that she quit smoking about 16 years ago. Her smoking use included cigarettes. She started smoking about 31 years ago. She has never used smokeless tobacco. She reports that she does not drink alcohol and does not use drugs.   Review of Systems:  Gen:  Denies  fever, sweats, chills weight loss  HEENT: Denies blurred vision, double vision, ear pain, eye pain, hearing loss, nose bleeds, sore throat Cardiac:  No dizziness, chest pain or heaviness, chest tightness,edema, No JVD Resp:   No cough, -sputum production, -shortness of breath,-wheezing, -hemoptysis,  Gi: Denies swallowing difficulty, stomach pain, nausea or vomiting, diarrhea, constipation, bowel incontinence Gu:  Denies bladder incontinence, burning urine Ext:   Denies Joint pain, stiffness or swelling Skin: Denies  skin rash, easy bruising or bleeding or hives Endoc:  Denies polyuria, polydipsia , polyphagia or weight change Psych:   Denies depression, insomnia or hallucinations  Other:  All other systems negative  VITAL SIGNS: BP 120/80   Pulse (!) 58    Temp 98.1 F (36.7 C)   Ht 5' 6 (1.676 m)   Wt 166 lb 6.4 oz (75.5 kg)   LMP 12/24/2006 (Approximate)   SpO2 100%   BMI 26.86 kg/m    Physical Examination:   General Appearance: No distress  EYES PERRLA, EOM intact.   NECK Supple, No JVD Pulmonary: normal breath sounds, No wheezing.  CardiovascularNormal S1,S2.  No m/r/g.   Abdomen: Benign, Soft, non-tender. Skin:   warm, no rashes, no ecchymosis  Extremities: normal, no cyanosis, clubbing. Neuro:without focal findings,  speech normal  PSYCHIATRIC: Mood, affect within normal limits.   ASSESSMENT AND PLAN  OSA To improve discomfort, increased humidity level to 6. Counseled patient on increasing CPAP compliance. Discussed the consequences of untreated sleep apnea. Advised not to drive drowsy for safety of patient and others. Will follow up in 6 weeks.     Patient  satisfied with Plan of action and management. All questions answered  I spent a total of 24 minutes reviewing chart data, face-to-face evaluation with the patient, counseling and coordination of care as detailed above.    Gilmar Bua, [name]  Sleep Medicine  Pulmonary & Critical Care Medicine

## 2024-09-07 NOTE — Patient Instructions (Addendum)

## 2024-09-07 NOTE — Progress Notes (Signed)
  Office: [phone]  /  Fax: [phone]    Date: September 07, 2024  Appointment Start Time: 11:31am Duration: 27 minutes Provider: Wyatt Fire, Psy.D. Type of Session: Individual Therapy  Location of Patient: Home (private location) Location of Provider: Provider's Home (private office) Type of Contact: Telepsychological Visit via MyChart Video Visit  Session Content: [name] is a 58 y.o. female presenting for a follow-up appointment to address the previously established treatment goal of increasing coping skills.Today's appointment was a telepsychological visit. [name] provided verbal consent for today's telepsychological appointment and she is aware she is responsible for securing confidentiality on her end of the session. Prior to proceeding with today's appointment, [name]'s physical location at the time of this appointment was obtained as well a phone number she could be reached at in the event of technical difficulties. [name] and this provider participated in today's telepsychological service.   This provider conducted a brief check-in. [name] shared, Things are going. She explained she knows what she needs to do, but described slipping.  Associated thoughts and feelings explored and processed. It was reflected she is often judgmental/critical toward herself. She agreed. As such, psychoeducation provided regarding self-compassion. [name] was engaged in a self-compassion exercise to help with eating-related challenges and other ongoing stressors. She was encouraged to regularly ask herself, "What do I need right now?" and How can I comfort and care for myself in this moment? Overall,  [name] was receptive to today's appointment as evidenced by openness to sharing, responsiveness to feedback, and willingness to work toward increasing self-compassion.  Mental Status Examination:  Appearance: neat Behavior: appropriate to circumstances Mood: neutral Affect: mood  congruent Speech: WNL Eye Contact: appropriate Psychomotor Activity: WNL Gait: unable to assess Thought Process: linear, logical, and goal directed and no evidence or endorsement of suicidal, homicidal, and self-harm ideation, plan and intent  Thought Content/Perception: no hallucinations, delusions, bizarre thinking or behavior endorsed or observed Orientation: AAOx4 Memory/Concentration: intact Insight: good Judgment: good  Interventions:  Conducted a brief chart review Provided empathic reflections and validation Provided positive reinforcement Employed supportive psychotherapy interventions to facilitate reduced distress and to improve coping skills with identified stressors Psychoeducation provided regarding self-compassion Engaged pt in a self-compassion exercise  DSM-5 Diagnosis(es): F50.89 Other Specified Feeding or Eating Disorder, Emotional Eating Behaviors and F41.9 Unspecified Anxiety Disorder  Treatment Goal & Progress: During the initial appointment with this provider, the following treatment goal was established: increase coping skills. [name] has demonstrated progress in her goal as evidenced by increased awareness of hunger patterns and increased awareness of triggers for emotional eating behaviors. [name] also continues to demonstrate willingness to engage in learned skills (e.g., urge surfing, mindful eating strategies).    Plan: The next appointment is scheduled for 09/21/2024 at 11:30am, which will be via MyChart Video Visit. The next session will focus on working towards the established treatment goal. [name] will continue with her primary therapist.    Wyatt Fire, PsyD

## 2024-09-08 ENCOUNTER — Encounter (INDEPENDENT_AMBULATORY_CARE_PROVIDER_SITE_OTHER): Payer: Self-pay | Admitting: Family Medicine

## 2024-09-08 ENCOUNTER — Ambulatory Visit: Admitting: Psychology

## 2024-09-08 ENCOUNTER — Ambulatory Visit (INDEPENDENT_AMBULATORY_CARE_PROVIDER_SITE_OTHER): Admitting: Family Medicine

## 2024-09-08 VITALS — BP 102/66 | HR 63 | Temp 98.3°F | Ht 66.0 in | Wt 162.0 lb

## 2024-09-08 DIAGNOSIS — F4321 Adjustment disorder with depressed mood: Secondary | ICD-10-CM | POA: Diagnosis not present

## 2024-09-08 DIAGNOSIS — G4733 Obstructive sleep apnea (adult) (pediatric): Secondary | ICD-10-CM | POA: Diagnosis not present

## 2024-09-08 DIAGNOSIS — F411 Generalized anxiety disorder: Secondary | ICD-10-CM

## 2024-09-08 DIAGNOSIS — F5089 Other specified eating disorder: Secondary | ICD-10-CM | POA: Diagnosis not present

## 2024-09-08 DIAGNOSIS — E559 Vitamin D deficiency, unspecified: Secondary | ICD-10-CM

## 2024-09-08 DIAGNOSIS — R7303 Prediabetes: Secondary | ICD-10-CM | POA: Diagnosis not present

## 2024-09-08 DIAGNOSIS — Z6826 Body mass index (BMI) 26.0-26.9, adult: Secondary | ICD-10-CM

## 2024-09-08 DIAGNOSIS — E669 Obesity, unspecified: Secondary | ICD-10-CM

## 2024-09-08 DIAGNOSIS — Z6827 Body mass index (BMI) 27.0-27.9, adult: Secondary | ICD-10-CM

## 2024-09-08 MED ORDER — BUPROPION HCL ER (SR) 150 MG PO TB12: 150.0000 mg | ORAL_TABLET | Freq: Two times a day (BID) | ORAL | Status: DC

## 2024-09-08 NOTE — Progress Notes (Signed)
 [name], D.O.  ABFM, ABOM Specializing in Clinical Bariatric Medicine  Office located at: [address] [place], [place]  [address]   Assessment and Plan:   Medications Discontinued During This Encounter  Medication Reason   buPROPion  ER (WELLBUTRIN  SR) 100 MG 12 hr tablet      Meds ordered this encounter  Medications   buPROPion  (WELLBUTRIN  SR) 150 MG 12 hr tablet    Sig: Take 1 tablet (150 mg total) by mouth 2 (two) times daily.    A1C, Vit D, BMP labs next OV   FOR THE DISEASE OF OBESITY:  Obesity (BMI 30-39.9) - Starting BMI 30.51 BMI 27.0-27.9,adult -- Current BMI 26.16 Assessment & Plan: Since last office visit on 08/06/24 patient's muscle mass has decreased by 7.2 lbs. Fat mass has increased by 6.2 lbs. Total body water  has increased by 0.4 lbs.  Body fat % has increased by 4.1  %. Counseling done on how various foods will affect these numbers and how to maximize success  Total lbs lost to date: -27 lbs Total weight loss percentage to date: -14.29 %  - Tracking Calories/Macros: no - pt was not asked to track   - Eating More Whole Foods: yes  - Adequate Protein Intake: yes  - Adequate Water  Intake: no   - Skipping Meals: no   - Sleeping 7-9 Hours/ Night: no    Recommended Dietary Goals [name] is currently in the action stage of change. As such, her goal is to continue weight management plan.  She has agreed to: Journal 1000-1100 calories with 80+ g lean protein    Behavioral Intervention We discussed the following today: avoiding skipping meals and work on tracking and journaling calories using tracking application  Additional resources provided today: Handout on Daily Food Journaling Log  Evidence-based interventions for health behavior change were utilized today including the discussion of self monitoring techniques, problem-solving barriers and SMART goal setting techniques.   Regarding patient's less desirable eating habits and  patterns, we employed the technique of small changes.   Goal(s) for next OV: Journal majority of days    Recommended Physical Activity Goals [name] has been advised to work up to 300-450 minutes of moderate intensity aerobic activity a week and strengthening exercises 2-3 times per week for cardiovascular health, weight loss maintenance and preservation of muscle mass.   She has agreed to: Think about enjoyable ways to increase daily physical activity and overcoming barriers to exercise and Increase physical activity in their day and reduce sedentary time (increase NEAT).   Pharmacotherapy We both agreed to: Continue with current nutritional and behavioral strategies and medications that aid in weight loss.    ASSOCIATED CONDITIONS ADDRESSED TODAY:  Prediabetes Assessment & Plan Lab Results  Component Value Date   HGBA1C 5.7 (H) 05/05/2024   HGBA1C 5.5 01/20/2024   HGBA1C 5.7 09/04/2023   INSULIN  4.8 05/05/2024   INSULIN  11.2 01/20/2024   Currently taking Metformin  500 mg BID with good compliance and tolerance. Pt states that she normally gets the recommended protein amount but sometimes does not and on those days feels like she does get more cravings. Last A1C was worsening will recheck at next OV. Pt will continue taking regimen as tolerated and continue following meal plan to help further reduce cravings and hunger.     Vitamin D  deficiency Assessment & Plan Lab Results  Component Value Date   VD25OH 70.3 05/05/2024   VD25OH 33.4 01/20/2024   VD25OH 37.16 04/04/2020  Currently on ERGO every 14 days. Good compliance and tolerance. Pt will continue taking. No refill needed today and will obtain labs at next OV.    Other Specified Feeding or Eating Disorder, Emotional Eating Behaviors Assessment & Plan Currently taking Wellbutrin  SR 150 mg BID with good compliance and tolerance.  Dose was increased on 7/30 but ran out and has been on this new dose for 6 weeks which is  not enough time to tell if there is a difference. Pt is seeing Dr.Barker to help her come up with strategies to decrease emotional eating. Her muscle mass has gone up 4 lbs and has lost 30.6 lbs since she has started. She has been doing very well and reminded her that this is slow and steady and not a sprint it is a marathon. No dose change today and no refill . Pt will continue journaling to help be more conscious of the foods she eat and increase her protein intake.     OSA (obstructive sleep apnea) Assessment & Plan Pt had a home sleep study done on 02/22/24. Was diagnosed with mild level sleep apnea. Her AHI was 12.7 per hour with oxygen at a baseline of 100% but the lowest being 77%. She has 16 desaturations. Pt states using CPAP but stopping the use due to feeling of extreme dryness. Pt spoke to Dr.Reddy and had the humidity lowered and started using the CPAP machine once again. Recommended she continue the use of CPAP machine as controlling this will help aid in the weight loss process    Follow up:   Return 09/22/24 at 9:40 AM  She was informed of the importance of frequent follow up visits to maximize her success with intensive lifestyle modifications for her multiple health conditions.    Subjective:    Chief complaint: Obesity [name] is here to discuss her progress with her obesity treatment plan. She is on the Category 1 Plan and states she is following her eating plan approximately 60% of the time. Pt has increased NEAT but no formal exercise.     Interval History:  [name] is here for a follow up office visit. Pt has experienced a weight loss of 2 lbs since last OV on 08/06/24. Pt endorses feeling like going more than 2 weeks is not beneficial to her. She reports feeling like she was slipping and did not know how to correct or feel better with such long time between visits.      Pharmacotherapy that aid with weight loss: She is currently taking Wellbutrin  SR 150  mg BID and Metformin  500 mg BID.    Review of Systems:  Pertinent positives were addressed with patient today.  Reviewed by clinician on day of visit: allergies, medications, problem list, medical history, surgical history, family history, social history, and previous encounter notes.  Weight Summary and Biometrics   Weight Lost Since Last Visit: 2lb  Weight Gained Since Last Visit: 0   Vitals Temp: 98.3 F (36.8 C) BP: 102/66 Pulse Rate: 63 SpO2: 100 %   Anthropometric Measurements Height: 5' 6 (1.676 m) Weight: 162 lb (73.5 kg) BMI (Calculated): 26.16 Weight at Last Visit: 164lb Weight Lost Since Last Visit: 2lb Weight Gained Since Last Visit: 0 Starting Weight: 189lb Total Weight Loss (lbs): 27 lb (12.2 kg) Peak Weight: 250lb   Body Composition  Body Fat %: 30.9 % Fat Mass (lbs): 50.2 lbs Muscle Mass (lbs): 106.8 lbs Total Body Water  (lbs): 69.2 lbs Visceral Fat Rating : 7  Other Clinical Data Fasting: no Labs: no Today's Visit #: 13 Starting Date: 01/20/24    Objective:   PHYSICAL EXAM: Blood pressure 102/66, pulse 63, temperature 98.3 F (36.8 C), height 5' 6 (1.676 m), weight 162 lb (73.5 kg), last menstrual period 12/24/2006, SpO2 100%. Body mass index is 26.15 kg/m.  General: she is overweight, cooperative and in no acute distress. PSYCH: Has normal mood, affect and thought process.   HEENT: EOMI, sclerae are anicteric. Lungs: Normal breathing effort, no conversational dyspnea. Extremities: Moves * 4 Neurologic: A and O * 3, good insight  DIAGNOSTIC DATA REVIEWED: BMET    Component Value Date/Time   NA 141 05/05/2024 0915   K 4.2 05/05/2024 0915   CL 103 05/05/2024 0915   CO2 23 05/05/2024 0915   GLUCOSE 83 05/05/2024 0915   GLUCOSE 90 04/21/2024 2310   BUN 17 05/05/2024 0915   CREATININE 0.83 05/05/2024 0915   CALCIUM  9.5 05/05/2024 0915   GFRNONAA >60 04/21/2024 2310   GFRAA >60 11/15/2017 2142   Lab Results  Component  Value Date   HGBA1C 5.7 (H) 05/05/2024   HGBA1C 5.6 10/17/2015   Lab Results  Component Value Date   INSULIN  4.8 05/05/2024   INSULIN  11.2 01/20/2024   Lab Results  Component Value Date   TSH 2.190 01/20/2024   CBC    Component Value Date/Time   WBC 6.7 04/21/2024 2310   RBC 4.50 04/21/2024 2310   HGB 12.9 04/21/2024 2310   HGB 14.3 01/20/2024 0953   HCT 38.7 04/21/2024 2310   HCT 42.3 01/20/2024 0953   PLT 210 04/21/2024 2310   PLT 253 01/20/2024 0953   MCV 86.0 04/21/2024 2310   MCV 87 01/20/2024 0953   MCH 28.7 04/21/2024 2310   MCHC 33.3 04/21/2024 2310   RDW 13.2 04/21/2024 2310   RDW 12.8 01/20/2024 0953   Iron Studies    Component Value Date/Time   IRON 89 11/14/2022 1321   TIBC 326.2 11/14/2022 1321   FERRITIN 87.8 11/14/2022 1321   IRONPCTSAT 27.3 11/14/2022 1321   Lipid Panel     Component Value Date/Time   CHOL 119 05/05/2024 0915   TRIG 53 05/05/2024 0915   HDL 48 05/05/2024 0915   CHOLHDL 2.5 05/05/2024 0915   CHOLHDL 3 09/04/2023 0837   VLDL 17.0 09/04/2023 0837   LDLCALC 59 05/05/2024 0915   LDLDIRECT 118.0 12/28/2019 1323   Hepatic Function Panel     Component Value Date/Time   PROT 6.7 05/05/2024 0915   ALBUMIN 4.3 05/05/2024 0915   AST 15 05/05/2024 0915   ALT 18 05/05/2024 0915   ALKPHOS 106 05/05/2024 0915   BILITOT 0.4 05/05/2024 0915   BILIDIR 0.1 09/04/2023 0837      Component Value Date/Time   TSH 2.190 01/20/2024 0953   Nutritional Lab Results  Component Value Date   VD25OH 70.3 05/05/2024   VD25OH 33.4 01/20/2024   VD25OH 37.16 04/04/2020    Attestations:   LILLETTE Sonny Laroche, acting as a Stage manager for Brittany Jenkins, DO., have compiled all relevant documentation for today's office visit on behalf of Brittany Jenkins, DO, while in the presence of [employer], DO.  I have spent 41 minutes in the care of the patient today including 31 minutes face-to-face assessing and reviewing listed medical problems  above as outlined in office visit note and providing nutritional and behavioral counseling as outlined in obesity care plan.   I have reviewed the above documentation for accuracy and  completeness, and I agree with the above. Brittany JINNY [name], D.O.  The 21st Century Cures Act was signed into law in 2016 which includes the topic of electronic health records.  This provides immediate access to information in MyChart.  This includes consultation notes, operative notes, office notes, lab results and pathology reports.  If you have any questions about what you read please let us  know at your next visit so we can discuss your concerns and take corrective action if need be.  We are right here with you.

## 2024-09-08 NOTE — Progress Notes (Signed)
 [facility] Counselor/Therapist Progress Note  Patient ID: [name], MRN: [id number],    Date: 09/08/24   Time Spent: 12:04pm-12:58pm  Treatment Type: Individual Therapy  Pt is seen for a virtual video visit via caregility. Pt consents to virtual visit and is aware of limitations of virtual visits.  Pt joins from her home, reporting privacy, and counselor from her home office.     Reported Symptoms: pt reports increased grief emotions in past week.  Pt reports increased awareness of negative self talk.   Mental Status Exam: Appearance:  Well Groomed     Behavior: Appropriate  Motor: Normal  Speech/Language:  Normal Rate  Affect: Appropriate  Mood: anxious  Thought process: normal  Thought content:   WNL  Sensory/Perceptual disturbances:   WNL  Orientation: oriented to person, place, time/date, and situation  Attention: Good  Concentration: Good  Memory: WNL  Fund of knowledge:  Good  Insight:   Good  Judgment:  Good  Impulse Control: Good   Risk Assessment: Danger to Self:  No Self-injurious Behavior: No Danger to Others: No Duty to Warn:no Physical Aggression / Violence:No  Access to Firearms a concern: No  Gang Involvement:No   Subjective: Counselor assessed pt current functioning per pt report.  Processed w/pt emotions of grief and negative self talk.  Validated and normalized grief reaction w/ today being anniversary of father's death.  Discussed use of coping skills for grounding and reframing when ruminates on death.  Explored self talk w/ mistakes and self image.  Assisted pt in self compassion approach and reframing self talk.   Pt affect wnl.  Pt reports that over past couple days more sadness, tearfulness and feelings of grief.  Pt able to validate for self and give self permission to express feelings and also shift to grounding.  Pt reports she recognizes rumination on death- worry for loved ones- and is able to recognize  distortions. Pt is working to reframe.  Pt discussed negative self talk about her messing up w/ health goals.  Pt is able to reflect on ways to be more self compassionate and reframing negative self talk.    Interventions: Cognitive Behavioral Therapy, Mindfulness Meditation, and supportive and self compassion  Diagnosis:Generalized anxiety disorder  Grief  Plan: Pt to f/u w/ Counseling in 3 weeks.  Pt to f/u w/ PCP, Cardiologist and [facility] as scheduled.   Individualized Treatment Plan Strengths: enjoys walking and movement, enjoys the beach, family is important to her  Supports: her husband, her daughter in law and her mom    Goal/Needs for Treatment:  In order of importance to patient 1) cope with anxiety 2) increase self worth and decrease negative thought patterns.  3) continue cope through losses       Client Statement of Needs:   talk about my grief, continue to work through my anxiety, try not be so negative on stuff and thinking things are going to go bad or wrong.  Finding time for self again.        Treatment Level: outpatient counseling  Symptoms:anxiety, worry, ruminating thoughts, low self worth, emotional escalations, depressed mood, sleep disturbance  Client Treatment Preferences: counseling every 2-4 weeks and continue medication management w/ PCP    Healthcare consumer's goal for treatment:   Counselor, Damien Herald, [facility] will support the patient's ability to achieve the goals identified. Cognitive Behavioral Therapy, Assertive Communication/Conflict Resolution Training, Relaxation Training, ACT, Humanistic and other evidenced-based practices will be  used to promote progress towards healthy functioning.    Healthcare consumer will: Actively participate in therapy, working towards healthy functioning.     *Justification for Continuation/Discontinuation of Goal: R=Revised, O=Ongoing, A=Achieved, D=Discontinued   Goal 1) Pt will increase coping  with life's anxiety and stressors AEB decreased ruminating worry, decreased emotional escalations, decreased negative thought patterns. That contribute to anxiety. Baseline date 03/10/24: Progress towards goal 40%; How Often - Daily Target Date Goal Was reviewed Status Code Progress towards goal  03/10/25                            Goal 2)  Increase self awareness, consistency w/ self care time and self compassion statements AEB pt report and therapist observation.   Baseline date 03/10/24: Progress towards goal 30; How Often - Daily Target Date Goal Was reviewed Status Code Progress towards goal/Likert rating  03/10/25                            Goal 3) Verbalize feelings of grief related to losses and recognize ways of living with grief present.  Baseline date 03/10/24: Progress towards goal 30; How Often - Daily Target Date Goal Was reviewed Status Code Progress towards goal/Likert rating  03/10/25                            This plan has been reviewed and created by the following participants:  This plan will be reviewed at least every 12 months. Date Behavioral Health Clinician Date Guardian/Patient   03/10/24 [facility] Barbarann [facility]                      03/10/24 Verbal consent provided and request for electronic signature sent and obtained                [name] [facility]

## 2024-09-13 ENCOUNTER — Other Ambulatory Visit (INDEPENDENT_AMBULATORY_CARE_PROVIDER_SITE_OTHER): Payer: Self-pay | Admitting: Family Medicine

## 2024-09-13 DIAGNOSIS — R7303 Prediabetes: Secondary | ICD-10-CM

## 2024-09-16 ENCOUNTER — Encounter: Payer: Self-pay | Admitting: Obstetrics and Gynecology

## 2024-09-16 ENCOUNTER — Ambulatory Visit (INDEPENDENT_AMBULATORY_CARE_PROVIDER_SITE_OTHER): Admitting: Obstetrics and Gynecology

## 2024-09-16 VITALS — BP 93/61 | HR 67

## 2024-09-16 DIAGNOSIS — N952 Postmenopausal atrophic vaginitis: Secondary | ICD-10-CM | POA: Diagnosis not present

## 2024-09-16 DIAGNOSIS — N8110 Cystocele, unspecified: Secondary | ICD-10-CM

## 2024-09-16 DIAGNOSIS — R151 Fecal smearing: Secondary | ICD-10-CM

## 2024-09-16 DIAGNOSIS — R350 Frequency of micturition: Secondary | ICD-10-CM

## 2024-09-16 MED ORDER — TROSPIUM CHLORIDE ER 60 MG PO CP24: 60.0000 mg | ORAL_CAPSULE | Freq: Every day | ORAL | 3 refills | Status: AC

## 2024-09-16 MED ORDER — ESTRADIOL 0.1 MG/GM VA CREA: 0.5000 g | TOPICAL_CREAM | VAGINAL | 11 refills | Status: AC

## 2024-09-16 NOTE — Progress Notes (Signed)
 [facility] Return Visit  SUBJECTIVE  History of Present Illness: [name] is a 58 y.o. 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 FI, OAB, vaginal atrophy and bladder prolapse.  Patient has a reoccurrence of anterior vaginal prolapse that both she and Dr. Marilynne are aware of. Dr. Marilynne has discussed options with her and patient is aware she can always re-discuss with Dr. Marilynne.    Patient's main concern today is she has been having smearing of feces that she is concerned could be coming from the vagina. She is unsure if it is being leaked from the back or if she is having stool actually leaking into the vagina from a hole in the rectum.    Past Medical History: Patient  has a past medical history of ADHD, Allergy, Anxiety (2014), Back pain, CAD (coronary artery disease), Constipated, Depression, Fatty liver, GERD (gastroesophageal reflux disease) (2000), History of abnormal cervical Pap smear, History of colon polyps, Hydronephrosis, left, Hyperlipidemia (2024), Kidney stones (2016), OSA (obstructive sleep apnea), PONV (postoperative nausea and vomiting), RLS (restless legs syndrome), Rosacea, and Vitamin D  deficiency.   Past Surgical History: She  has a past surgical history that includes Laparoscopic cholecystectomy (1999); Tubal ligation (Bilateral, 1995); Cystoscopy w/ ureteral stent placement (01/1999); Extracorporeal shock wave lithotripsy (2001  &  2002); DX LAPAROSCOPY (X2); EXPLORATORY LAPAROTOMY W/ BILATERAL SALPINGECTOMY AND REMOVAL RIGHT ECTOPIC PREG. (02/23/2004); TOTAL ABDOMINAL HYSTERECTOMY W/ BILATERAL OOPHORECTOMY AND LYSIS ADHESIONS (09/02/2007); Esophagogastroduodenoscopy (05/09/2007); Umbilical hernia repair (2003); Shoulder arthroscopy with open rotator cuff repair (Right, 2012); Cystoscopy with ureteroscopy and stent placement (Left, 05/18/2015); Cystoscopy with holmium laser lithotripsy (Left, 05/18/2015); Cystoscopy  with retrograde pyelogram, ureteroscopy and stent placement (Left, 06/22/2015); Cystoscopy w/ retrogrades (Bilateral, 06/22/2015); Abdominal hysterectomy (2008); Diagnostic laparoscopy; Anterior and posterior repair with sacrospinous fixation (N/A, 08/16/2015); Bladder suspension (N/A, 08/16/2015); Cystoscopy (N/A, 08/16/2015); Vaginal hysterectomy (N/A, 08/18/2015); Polypectomy; Colonoscopy (08/21/2017); Robotic assisted laparoscopic sacrocolpopexy (N/A, 12/12/2022); Cystoscopy (N/A, 12/12/2022); Cystocele repair (N/A, 12/12/2022); and Cholecystectomy (1999).   Medications: She has a current medication list which includes the following prescription(s): alprazolam , aspirin  ec, atorvastatin , bupropion , cyanocobalamin , [START ON 09/17/2024] estradiol , metformin , nitroglycerin , trazodone , trospium  chloride, and vitamin d  (ergocalciferol ).   Allergies: Patient is allergic to desvenlafaxine and prochlorperazine .   Social History: Patient  reports that she quit smoking about 16 years ago. Her smoking use included cigarettes. She started smoking about 31 years ago. She has never used smokeless tobacco. She reports that she does not drink alcohol and does not use drugs.     OBJECTIVE     Physical Exam: Vitals:   09/16/24 0829  BP: 93/61  Pulse: 67   Gen: No apparent distress, A&O x 3.  Detailed Urogynecologic Evaluation:  On evaluation, patient's anterior vaginal wall is prolapsed with vaginal atrophy noted on exam.  A speculum exam was performed while a digital exam with methylene blue  infused lubrication was inserted into the rectum. This was pressed along the interior of the rectum to see if there was any noted dye to make it into the vagina from the rectal side. No leakage of dye was noted into the vaginal tissues.   No mesh was noted either anterior or posteriorly on the vaginal walls. There was no sign of fecal matter, bleeding, or other irritation in the vaginal tissues.     ASSESSMENT AND  PLAN    Ms. [name] is a 58 y.o. with:  1. Fecal smearing   2. Urinary frequency   3. Prolapse of anterior  vaginal wall   4. Vaginal atrophy    Patient is having looser, not diarrhea like, stools. We discussed that this could be causing some of the fecal leakage. I encouraged her to take bulking medication such as Benefiber or Metamucil to increase the bulk of her stool and to use a squatty potty at home to reduce the chance of incomplete defecation.  Patient is getting up 1-2 times at night and urinating 7-9 times during the day. Will start Trospium  60mg  ER. As this is an anticholinergic we discussed the symptoms of dry mouth and dry eyes, but it may also assist a little in her FI due to the slowing down of the stool.  Patient is unsure if she wants to do anything for the anterior vaginal wall. She will re-discuss with Dr. Marilynne at follow up appointment to re-visit her options.  Patient to re-start estrogen cream. She needs to use it nightly again for 2 weeks then twice a week after. We also discussed for intercourse she could consider things like stimulation gels for the clitoris and silicon blend lubrication to assist her with orgasm and the glide during intercourse. Samples given of uberlube to try.   Patient to follow up in 6 weeks with Dr. Marilynne or sooner if needed.   Keefe Zawistowski G Theda Payer, NP

## 2024-09-16 NOTE — Patient Instructions (Addendum)
 Restart estrogen cream. Please use this nightly for 2 weeks and then twice week after.   Consider use of uber lube for intercourse  Start Trospium  for the bowel and bladder urgency.   Start benefiber or Metamucil daily to bulk stool  Use your squatty potty when possible.

## 2024-09-21 ENCOUNTER — Telehealth (INDEPENDENT_AMBULATORY_CARE_PROVIDER_SITE_OTHER): Admitting: Psychology

## 2024-09-21 DIAGNOSIS — F5089 Other specified eating disorder: Secondary | ICD-10-CM | POA: Diagnosis not present

## 2024-09-21 DIAGNOSIS — F419 Anxiety disorder, unspecified: Secondary | ICD-10-CM | POA: Diagnosis not present

## 2024-09-21 NOTE — Progress Notes (Signed)
  Office: [phone]  /  Fax: [phone]    Date: September 21, 2024  Appointment Start Time: 11:33am Duration: 22 minutes Provider: Wyatt Fire, Psy.D. Type of Session: Individual Therapy  Location of Patient: Home (private location) Location of Provider: Provider's Home (private office) Type of Contact: Telepsychological Visit via MyChart Video Visit  Session Content: [name] is a 58 y.o. female presenting for a follow-up appointment to address the previously established treatment goal of increasing coping skills. Today's appointment was a telepsychological visit. [name] provided verbal consent for today's telepsychological appointment and she is aware she is responsible for securing confidentiality on her end of the session. Prior to proceeding with today's appointment, [name]'s physical location at the time of this appointment was obtained as well a phone number she could be reached at in the event of technical difficulties. [name] and this provider participated in today's telepsychological service.   This provider conducted a brief check-in. [name] shared about her recent appointment with Dr. Midge, noting she is journaling now. Her experience to date was explored and processed. She described a reduction in emotional eating behaviors, noting she engaged in urge surfing as needed. Self-compassion reviewed. This provider and [name] explored the story her mind tells her about her weight loss/eating habits that is stuck on repeat. For homework, she was encouraged to  re-write a more compassionate version. Overall, [name] was receptive to today's appointment as evidenced by openness to sharing, responsiveness to feedback, and willingness to work toward increasing self-compassion.  Mental Status Examination:  Appearance: neat Behavior: appropriate to circumstances Mood: neutral Affect: mood congruent Speech: WNL Eye Contact: appropriate Psychomotor Activity: WNL Gait: unable to  assess Thought Process: linear, logical, and goal directed and no evidence or endorsement of suicidal, homicidal, and self-harm ideation, plan and intent  Thought Content/Perception: no hallucinations, delusions, bizarre thinking or behavior endorsed or observed Orientation: AAOx4 Memory/Concentration: intact Insight: good Judgment: good  Interventions:  Conducted a brief chart review Provided empathic reflections and validation Reviewed content from the previous session Provided positive reinforcement Employed supportive psychotherapy interventions to facilitate reduced distress and to improve coping skills with identified stressors Engaged pt in a self-compassion exercise  DSM-5 Diagnosis(es): F50.89 Other Specified Feeding or Eating Disorder, Emotional Eating Behaviors and F41.9 Unspecified Anxiety Disorder  Treatment Goal & Progress: During the initial appointment with this provider, the following treatment goal was established: increase coping skills. [name] has demonstrated progress in her goal as evidenced by increased awareness of hunger patterns, increased awareness of triggers for emotional eating behaviors, and reduction in emotional eating behaviors. [name] also continues to demonstrate willingness to engage in learned skills (e.g., urge surfing, mindful eating strategies).    Plan: The next appointment is scheduled for 10/12/2024 at 11:30am, which will be via MyChart Video Visit. The next session will focus on working towards the established treatment goal. [name] will continue with her primary therapist.    Wyatt Fire, PsyD

## 2024-09-22 ENCOUNTER — Ambulatory Visit (INDEPENDENT_AMBULATORY_CARE_PROVIDER_SITE_OTHER): Admitting: Family Medicine

## 2024-09-24 ENCOUNTER — Encounter: Payer: Self-pay | Admitting: Obstetrics and Gynecology

## 2024-09-25 ENCOUNTER — Other Ambulatory Visit: Payer: Self-pay | Admitting: Obstetrics and Gynecology

## 2024-09-28 ENCOUNTER — Encounter (INDEPENDENT_AMBULATORY_CARE_PROVIDER_SITE_OTHER): Payer: Self-pay | Admitting: Family Medicine

## 2024-09-29 ENCOUNTER — Ambulatory Visit: Admitting: Psychology

## 2024-10-01 ENCOUNTER — Ambulatory Visit (INDEPENDENT_AMBULATORY_CARE_PROVIDER_SITE_OTHER): Admitting: Psychology

## 2024-10-01 DIAGNOSIS — F4321 Adjustment disorder with depressed mood: Secondary | ICD-10-CM | POA: Diagnosis not present

## 2024-10-01 DIAGNOSIS — F411 Generalized anxiety disorder: Secondary | ICD-10-CM

## 2024-10-01 NOTE — Progress Notes (Signed)
 [facility] Counselor/Therapist Progress Note  Patient ID: [name], MRN: [id number],    Date: 10/01/24   Time Spent: 11:01am-11:39am  Treatment Type: Individual Therapy  Pt is seen for a virtual video visit via caregility. Pt consents to virtual visit and is aware of limitations of virtual visits.  Pt joins from her home, reporting privacy, and counselor from her office.     Reported Symptoms: pt is experiencing grief w/ death of her son 2 days ago.  Mental Status Exam: Appearance:  Well Groomed     Behavior: Appropriate  Motor: Normal  Speech/Language:  Normal Rate  Affect: Appropriate  Mood: sad  Thought process: normal  Thought content:   WNL  Sensory/Perceptual disturbances:   WNL  Orientation: oriented to person, place, time/date, and situation  Attention: Good  Concentration: Good  Memory: WNL  Fund of knowledge:  Good  Insight:   Good  Judgment:  Good  Impulse Control: Good   Risk Assessment: Danger to Self:  No Self-injurious Behavior: No Danger to Others: No Duty to Warn:no Physical Aggression / Violence:No  Access to Firearms a concern: No  Gang Involvement:No   Subjective: Counselor assessed pt current functioning per pt report.  Processed w/pt recent loss of her son and emotions present.  Provided safe space to talk about son's death, interactions w/family and her emotions.  Validated and normalized her grief reactions.  Discussed supports and not having expectations for how her grieving should look.   pt affect congruent w/ grief.  Pt shared her son's death 2 days ago in accident on job.  Pt reports disbelief experienced initially and how this loss is very different from grieving her dad. Pt discussed interactions w/ her family and friends.  Pt reports support that has been offered.  Pt discussed awareness of how may experience her grief in different ways.  Pt reports took her meds for sleep last night as hadn't slept the night  before.    Interventions: Cognitive Behavioral Therapy, Mindfulness Meditation, and supportive and self compassion  Diagnosis:Grief  Generalized anxiety disorder  Plan: Pt to f/u w/ Counseling in 1 week.  Pt to f/u w/ PCP, Cardiologist and [facility] as scheduled.   Individualized Treatment Plan Strengths: enjoys walking and movement, enjoys the beach, family is important to her  Supports: her husband, her daughter in law and her mom    Goal/Needs for Treatment:  In order of importance to patient 1) cope with anxiety 2) increase self worth and decrease negative thought patterns.  3) continue cope through losses       Client Statement of Needs:   talk about my grief, continue to work through my anxiety, try not be so negative on stuff and thinking things are going to go bad or wrong.  Finding time for self again.        Treatment Level: outpatient counseling  Symptoms:anxiety, worry, ruminating thoughts, low self worth, emotional escalations, depressed mood, sleep disturbance  Client Treatment Preferences: counseling every 2-4 weeks and continue medication management w/ PCP    Healthcare consumer's goal for treatment:   Counselor, Damien Herald, [facility] will support the patient's ability to achieve the goals identified. Cognitive Behavioral Therapy, Assertive Communication/Conflict Resolution Training, Relaxation Training, ACT, Humanistic and other evidenced-based practices will be used to promote progress towards healthy functioning.    Healthcare consumer will: Actively participate in therapy, working towards healthy functioning.     *Justification for Continuation/Discontinuation of Goal:  R=Revised, O=Ongoing, A=Achieved, D=Discontinued   Goal 1) Pt will increase coping with life's anxiety and stressors AEB decreased ruminating worry, decreased emotional escalations, decreased negative thought patterns. That contribute to anxiety. Baseline date 03/10/24: Progress  towards goal 40%; How Often - Daily Target Date Goal Was reviewed Status Code Progress towards goal  03/10/25                            Goal 2)  Increase self awareness, consistency w/ self care time and self compassion statements AEB pt report and therapist observation.   Baseline date 03/10/24: Progress towards goal 30; How Often - Daily Target Date Goal Was reviewed Status Code Progress towards goal/Likert rating  03/10/25                            Goal 3) Verbalize feelings of grief related to losses and recognize ways of living with grief present.  Baseline date 03/10/24: Progress towards goal 30; How Often - Daily Target Date Goal Was reviewed Status Code Progress towards goal/Likert rating  03/10/25                            This plan has been reviewed and created by the following participants:  This plan will be reviewed at least every 12 months. Date Behavioral Health Clinician Date Guardian/Patient   03/10/24 [name]ann [facility]                      03/10/24 Verbal consent provided and request for electronic signature sent and obtained              [name] [facility]

## 2024-10-06 ENCOUNTER — Ambulatory Visit (INDEPENDENT_AMBULATORY_CARE_PROVIDER_SITE_OTHER): Admitting: Family Medicine

## 2024-10-07 ENCOUNTER — Ambulatory Visit: Admitting: Psychology

## 2024-10-07 DIAGNOSIS — F4321 Adjustment disorder with depressed mood: Secondary | ICD-10-CM

## 2024-10-07 DIAGNOSIS — F411 Generalized anxiety disorder: Secondary | ICD-10-CM

## 2024-10-07 NOTE — Progress Notes (Signed)
  Behavioral Health Counselor/Therapist Progress Note  Patient ID: [name], MRN: [id number],    Date: 10/07/24   Time Spent: 8:00am-8:42am  Treatment Type: Individual Therapy  Pt is seen for a virtual video visit via caregility. Pt consents to virtual visit and is aware of limitations of virtual visits.  Pt joins from her home, reporting privacy, and counselor from her office.     Reported Symptoms: pt grieving, tearful and expresses worry of how will get through.  Mental Status Exam: Appearance:  Well Groomed     Behavior: Appropriate  Motor: Normal  Speech/Language:  Normal Rate  Affect: Appropriate  Mood: anxious and sad  Thought process: normal  Thought content:   WNL  Sensory/Perceptual disturbances:   WNL  Orientation: oriented to person, place, time/date, and situation  Attention: Good  Concentration: Good  Memory: WNL  Fund of knowledge:  Good  Insight:   Good  Judgment:  Good  Impulse Control: Good   Risk Assessment: Danger to Self:  No Self-injurious Behavior: No Danger to Others: No Duty to Warn:no Physical Aggression / Violence:No  Access to Firearms a concern: No  Gang Involvement:No   Subjective: Counselor assessed pt current functioning per pt report.  Processed w/pt emotions, interactions and grief response.  Provided safe space to talk about son's death, and her grief.  Validated and normalized her grief reactions.  Discussed use of grounding skills to utilize and her supports system.  Pt affect congruent w/ grief.  Pt shared about her son's funeral and process leading up to.  Pt reflected on how her grief is different than grieving her dad and worries how will get through.  Pt discussed interactions w/ family and friends and concern for her daughter in law, grandkids, son and other grief.  Pt reflected on how she is using her grounding to cope through tough moments.    Interventions: Cognitive Behavioral Therapy, Mindfulness  Meditation, and supportive and self compassion  Diagnosis:Grief  Generalized anxiety disorder  Plan: Pt to f/u w/ Counseling in 1 week.  Pt to f/u w/ PCP, Cardiologist and [facility] as scheduled.   Individualized Treatment Plan Strengths: enjoys walking and movement, enjoys the beach, family is important to her  Supports: her husband, her daughter in law and her mom    Goal/Needs for Treatment:  In order of importance to patient 1) cope with anxiety 2) increase self worth and decrease negative thought patterns.  3) continue cope through losses       Client Statement of Needs:   talk about my grief, continue to work through my anxiety, try not be so negative on stuff and thinking things are going to go bad or wrong.  Finding time for self again.        Treatment Level: outpatient counseling  Symptoms:anxiety, worry, ruminating thoughts, low self worth, emotional escalations, depressed mood, sleep disturbance  Client Treatment Preferences: counseling every 2-4 weeks and continue medication management w/ PCP    Healthcare consumer's goal for treatment:   Counselor, Damien Herald, [facility] will support the patient's ability to achieve the goals identified. Cognitive Behavioral Therapy, Assertive Communication/Conflict Resolution Training, Relaxation Training, ACT, Humanistic and other evidenced-based practices will be used to promote progress towards healthy functioning.    Healthcare consumer will: Actively participate in therapy, working towards healthy functioning.     *Justification for Continuation/Discontinuation of Goal: R=Revised, O=Ongoing, A=Achieved, D=Discontinued   Goal 1) Pt will increase coping with life's anxiety  and stressors AEB decreased ruminating worry, decreased emotional escalations, decreased negative thought patterns. That contribute to anxiety. Baseline date 03/10/24: Progress towards goal 40%; How Often - Daily Target Date Goal Was reviewed  Status Code Progress towards goal  03/10/25                            Goal 2)  Increase self awareness, consistency w/ self care time and self compassion statements AEB pt report and therapist observation.   Baseline date 03/10/24: Progress towards goal 30; How Often - Daily Target Date Goal Was reviewed Status Code Progress towards goal/Likert rating  03/10/25                            Goal 3) Verbalize feelings of grief related to losses and recognize ways of living with grief present.  Baseline date 03/10/24: Progress towards goal 30; How Often - Daily Target Date Goal Was reviewed Status Code Progress towards goal/Likert rating  03/10/25                            This plan has been reviewed and created by the following participants:  This plan will be reviewed at least every 12 months. Date Behavioral Health Clinician Date Guardian/Patient   03/10/24 [facility]                      03/10/24 Verbal consent provided and request for electronic signature sent and obtained             [name] [facility]

## 2024-10-08 ENCOUNTER — Encounter (INDEPENDENT_AMBULATORY_CARE_PROVIDER_SITE_OTHER): Payer: Self-pay | Admitting: Family Medicine

## 2024-10-08 ENCOUNTER — Ambulatory Visit (INDEPENDENT_AMBULATORY_CARE_PROVIDER_SITE_OTHER): Admitting: Family Medicine

## 2024-10-08 VITALS — BP 134/76 | HR 79 | Temp 98.4°F | Ht 66.0 in | Wt 157.0 lb

## 2024-10-08 DIAGNOSIS — E669 Obesity, unspecified: Secondary | ICD-10-CM

## 2024-10-08 DIAGNOSIS — R7303 Prediabetes: Secondary | ICD-10-CM

## 2024-10-08 DIAGNOSIS — E559 Vitamin D deficiency, unspecified: Secondary | ICD-10-CM

## 2024-10-08 DIAGNOSIS — F4321 Adjustment disorder with depressed mood: Secondary | ICD-10-CM

## 2024-10-08 DIAGNOSIS — F430 Acute stress reaction: Secondary | ICD-10-CM | POA: Diagnosis not present

## 2024-10-08 DIAGNOSIS — F5089 Other specified eating disorder: Secondary | ICD-10-CM

## 2024-10-08 DIAGNOSIS — Z6825 Body mass index (BMI) 25.0-25.9, adult: Secondary | ICD-10-CM

## 2024-10-08 DIAGNOSIS — Z6827 Body mass index (BMI) 27.0-27.9, adult: Secondary | ICD-10-CM

## 2024-10-08 DIAGNOSIS — Z634 Disappearance and death of family member: Secondary | ICD-10-CM

## 2024-10-08 MED ORDER — VITAMIN D (ERGOCALCIFEROL) 1.25 MG (50000 UNIT) PO CAPS: 50000.0000 [IU] | ORAL_CAPSULE | ORAL | Status: DC

## 2024-10-08 MED ORDER — METFORMIN HCL 500 MG PO TABS: ORAL_TABLET | ORAL | 0 refills | Status: DC

## 2024-10-08 MED ORDER — BUPROPION HCL ER (XL) 300 MG PO TB24: 300.0000 mg | ORAL_TABLET | Freq: Every day | ORAL | 0 refills | Status: DC

## 2024-10-08 MED ORDER — TRAZODONE HCL 100 MG PO TABS: 100.0000 mg | ORAL_TABLET | Freq: Every day | ORAL | 0 refills | Status: AC

## 2024-10-08 NOTE — Progress Notes (Signed)
 [name], D.O.  ABFM, ABOM Specializing in Clinical Bariatric Medicine  Office located at: [address] [place], [place]  [address]      A) FOR THE CHRONIC DISEASE OF OBESITY:  Chief complaint: Obesity [name] is here to discuss her progress with her obesity treatment plan.   History of present illness / Interval history:  [name] is here today for her follow-up office visit.  Since last OV on 09/08/24, pt is down 5 lbs. Pt is currently going through a difficult time currently. Her son passed away Oct 09, 2025and is working on ways to help deal with her grief.     09/08/24 08:00 10/08/24 08:00   Body Fat % 30.9 % 36.5 %  Muscle Mass (lbs) 106.8 lbs 94.8 lbs  Fat Mass (lbs) 50.2 lbs 57.4 lbs  Total Body Water  (lbs) 69.2 lbs 66.6 lbs  Visceral Fat Rating  7 8    Counseling done on how various foods will affect these numbers and how to maximize success   Total lbs lost to date: - 32 lbs Total Fat Mass in lbs lost to date: - 21.4 lbs Total weight loss percentage to date: - 16.93 %    Obesity (BMI 30-39.9) - Starting BMI 30.51 BMI 27.0-27.9,adult -- Current BMI 25.35  Nutrition Therapy She is journaling 1000-1100 calories with 80+ g lean protein and states she is following her eating plan approximately 50 % of the time.   - Tracking Calories/Macros: yes  - Eating More Whole Foods: yes  - Adequate Protein Intake: yes  - Adequate Water  Intake: yes  - Skipping Meals: no   - Sleeping 7-9 Hours/ Night: no    [name] is currently in the action stage of change. As such, her goal is to continue weight management plan.  She has agreed to: continue current plan   Physical Activity [name] is walking or doing zumba 30-40  minutes 3 to 4 days per week   [name] has been advised to work up to 300-450 minutes of moderate intensity aerobic activity a week and strengthening exercises 2-3 times per week for cardiovascular health, weight loss  maintenance and preservation of muscle mass.  She has agreed to : Think about enjoyable ways to increase daily physical activity and overcoming barriers to exercise and Increase physical activity in their day and reduce sedentary time (increase NEAT).   Behavioral Modifications Evidence-based interventions for health behavior change were utilized today including the discussion of  1) self monitoring techniques:    2) problem-solving barriers:    3) self care:    - Focus on yourself   - Continue seeing therapist   4) SMART goals for next OV:    - Walk to help mental well being   Regarding patient's less desirable eating habits and patterns, we employed the technique of small changes.   We discussed the following today: increasing water  intake  and work on managing stress, creating time for self-care and relaxation, natural ways to help sleep like melatonin  Additional resources provided today: None   Medical Interventions/ Pharmacotherapy Previous Bariatric surgery: n/a Pharmacotherapy for weight loss: She is currently taking Metformin  500 mg BID and Wellbutrin  SR 150mg  BID for medical weight loss.    We discussed various medication options to help [name] with her weight loss efforts and we both agreed to : Continue with current nutritional and behavioral strategies   B) OBESITY RELATED CONDITIONS ADDRESSED TODAY:  Prediabetes Assessment & Plan Lab  Results  Component Value Date   HGBA1C 5.7 (H) 05/05/2024   HGBA1C 5.5 01/20/2024   HGBA1C 5.7 09/04/2023   INSULIN  4.8 05/05/2024   INSULIN  11.2 01/20/2024    Metformin  500 mg BID. Pt was previously taking her Metformin  consistently with no issue but since the loss of her son she has not been taking her Metformin . Recommended pt to begin Metformin  again to avoid increase of hunger and cravings. Continue following prudent meal plan and avoiding skipping meals.    Other Specified Feeding or Eating Disorder, Emotional Eating  Behaviors Assessment & Plan Taking Wellbutrin  SR 150 mg BID. Good compliance and tolerance. Pt states that she has had some increase in cravings but not hunger. She is currently going through a difficult loss. She endorses having panic and anxiety attacks when she is alone. Mutually agreed to switch to Wellbutrin  XL 1 tablet daily. Continue practicing mindful eating and working on ways to take care of yourself.      Vitamin D  deficiency Assessment & Plan Lab Results  Component Value Date   VD25OH 70.3 05/05/2024   VD25OH 33.4 01/20/2024   VD25OH 37.16 04/04/2020   On ERGO 50K units weekly. Good compliance and tolerance. Pt states that she is still taking but states that her refill will not be delivered until November. Recommended pt to get OTC supplementation to avoid not having supplementation. Will recheck levels today.    Acute reaction to stress Grief at loss of child Assessment & Plan Pt has recently lost her son on Oct 7th. She states that she has had a difficult time and is trying to get back on track. Pt states that she can feel herself get panic and anxiety attacks when she is alone now that family is leaving. She endorses not liking when it is quiet around her like when she is driving because it gives her time to be in her thoughts. She is struggling with sleep as well. Recommended pt to reach out to Dr.Fry to maybe explore medications for her wellbeing. Mutually agreed to increase Trazadone to 100 mg nightly to help with sleep. Continue working with therapist and self care regimen.       Medications Discontinued During This Encounter  Medication Reason   buPROPion  (WELLBUTRIN  SR) 150 MG 12 hr tablet Dose change   traZODone  (DESYREL ) 50 MG tablet    metFORMIN  (GLUCOPHAGE ) 500 MG tablet Reorder   Vitamin D , Ergocalciferol , (DRISDOL ) 1.25 MG (50000 UNIT) CAPS capsule      Meds ordered this encounter  Medications   buPROPion  (WELLBUTRIN  XL) 300 MG 24 hr tablet    Sig: Take  1 tablet (300 mg total) by mouth daily.    Dispense:  90 tablet    Refill:  0   Vitamin D , Ergocalciferol , (DRISDOL ) 1.25 MG (50000 UNIT) CAPS capsule    Sig: Take 1 capsule (50,000 Units total) by mouth every 14 (fourteen) days.   traZODone  (DESYREL ) 100 MG tablet    Sig: Take 1 tablet (100 mg total) by mouth at bedtime.    Dispense:  90 tablet    Refill:  0   metFORMIN  (GLUCOPHAGE ) 500 MG tablet    Sig: 1 po with lunch and dinner daily    Dispense:  180 tablet    Refill:  0    90 d supply;  ** OV for RF **   Do not send RF request      Follow up:   No follow-ups on file.  She was  informed of the importance of frequent follow up visits to maximize her success with intensive lifestyle modifications for her multiple health conditions.   Weight Summary and Biometrics   Weight Lost Since Last Visit: 5lb  Weight Gained Since Last Visit: 0lb    Vitals Temp: 98.4 F (36.9 C) BP: 134/76 Pulse Rate: 79 SpO2: 99 %   Anthropometric Measurements Height: 5' 6 (1.676 m) Weight: 157 lb (71.2 kg) BMI (Calculated): 25.35 Weight at Last Visit: 162lb Weight Lost Since Last Visit: 5lb Weight Gained Since Last Visit: 0lb Starting Weight: 189lb Total Weight Loss (lbs): 32 lb (14.5 kg) Peak Weight: 250lb   Body Composition  Body Fat %: 36.5 % Fat Mass (lbs): 57.4 lbs Muscle Mass (lbs): 94.8 lbs Total Body Water  (lbs): 66.6 lbs Visceral Fat Rating : 8   Other Clinical Data Fasting: no Labs: yes Today's Visit #: 14 Starting Date: 01/20/24    Objective:   PHYSICAL EXAM: Blood pressure 134/76, pulse 79, temperature 98.4 F (36.9 C), height 5' 6 (1.676 m), weight 157 lb (71.2 kg), last menstrual period 12/24/2006, SpO2 99%. Body mass index is 25.34 kg/m.  General: she is overweight, cooperative and in no acute distress. PSYCH: Has normal mood, affect and thought process.   HEENT: EOMI, sclerae are anicteric. Lungs: Normal breathing effort, no conversational  dyspnea. Extremities: Moves * 4 Neurologic: A and O * 3, good insight  DIAGNOSTIC DATA REVIEWED: BMET    Component Value Date/Time   NA 141 05/05/2024 0915   K 4.2 05/05/2024 0915   CL 103 05/05/2024 0915   CO2 23 05/05/2024 0915   GLUCOSE 83 05/05/2024 0915   GLUCOSE 90 04/21/2024 2310   BUN 17 05/05/2024 0915   CREATININE 0.83 05/05/2024 0915   CALCIUM  9.5 05/05/2024 0915   GFRNONAA >60 04/21/2024 2310   GFRAA >60 11/15/2017 2142   Lab Results  Component Value Date   HGBA1C 5.7 (H) 05/05/2024   HGBA1C 5.6 10/17/2015   Lab Results  Component Value Date   INSULIN  4.8 05/05/2024   INSULIN  11.2 01/20/2024   Lab Results  Component Value Date   TSH 2.190 01/20/2024   CBC    Component Value Date/Time   WBC 6.7 04/21/2024 2310   RBC 4.50 04/21/2024 2310   HGB 12.9 04/21/2024 2310   HGB 14.3 01/20/2024 0953   HCT 38.7 04/21/2024 2310   HCT 42.3 01/20/2024 0953   PLT 210 04/21/2024 2310   PLT 253 01/20/2024 0953   MCV 86.0 04/21/2024 2310   MCV 87 01/20/2024 0953   MCH 28.7 04/21/2024 2310   MCHC 33.3 04/21/2024 2310   RDW 13.2 04/21/2024 2310   RDW 12.8 01/20/2024 0953   Iron Studies    Component Value Date/Time   IRON 89 11/14/2022 1321   TIBC 326.2 11/14/2022 1321   FERRITIN 87.8 11/14/2022 1321   IRONPCTSAT 27.3 11/14/2022 1321   Lipid Panel     Component Value Date/Time   CHOL 119 05/05/2024 0915   TRIG 53 05/05/2024 0915   HDL 48 05/05/2024 0915   CHOLHDL 2.5 05/05/2024 0915   CHOLHDL 3 09/04/2023 0837   VLDL 17.0 09/04/2023 0837   LDLCALC 59 05/05/2024 0915   LDLDIRECT 118.0 12/28/2019 1323   Hepatic Function Panel     Component Value Date/Time   PROT 6.7 05/05/2024 0915   ALBUMIN 4.3 05/05/2024 0915   AST 15 05/05/2024 0915   ALT 18 05/05/2024 0915   ALKPHOS 106 05/05/2024 0915   BILITOT 0.4  05/05/2024 0915   BILIDIR 0.1 09/04/2023 0837      Component Value Date/Time   TSH 2.190 01/20/2024 0953   Nutritional Lab Results   Component Value Date   VD25OH 70.3 05/05/2024   VD25OH 33.4 01/20/2024   VD25OH 37.16 04/04/2020    Attestations:   [name], acting as a medical scribe for Brittany Jenkins, DO., have compiled all relevant documentation for today's office visit on behalf of Brittany Jenkins, DO, while in the presence of [employer], DO.   I have reviewed the above documentation for accuracy and completeness, and I agree with the above. [name], D.O.  The 21st Century Cures Act was signed into law in 2016 which includes the topic of electronic health records.  This provides immediate access to information in MyChart.  This includes consultation notes, operative notes, office notes, lab results and pathology reports.  If you have any questions about what you read please let us  know at your next visit so we can discuss your concerns and take corrective action if need be.  We are right here with you.

## 2024-10-12 ENCOUNTER — Telehealth (INDEPENDENT_AMBULATORY_CARE_PROVIDER_SITE_OTHER): Admitting: Psychology

## 2024-10-12 DIAGNOSIS — Z634 Disappearance and death of family member: Secondary | ICD-10-CM

## 2024-10-12 DIAGNOSIS — F419 Anxiety disorder, unspecified: Secondary | ICD-10-CM | POA: Diagnosis not present

## 2024-10-12 DIAGNOSIS — F5089 Other specified eating disorder: Secondary | ICD-10-CM

## 2024-10-12 LAB — COMPREHENSIVE METABOLIC PANEL WITH GFR
ALT: 11 IU/L (ref 0–32)
AST: 17 IU/L (ref 0–40)
Albumin: 4.4 g/dL (ref 3.8–4.9)
Alkaline Phosphatase: 94 IU/L (ref 49–135)
BUN/Creatinine Ratio: 17 (ref 9–23)
BUN: 12 mg/dL (ref 6–24)
Bilirubin Total: 0.4 mg/dL (ref 0.0–1.2)
CO2: 26 mmol/L (ref 20–29)
Calcium: 9.4 mg/dL (ref 8.7–10.2)
Chloride: 103 mmol/L (ref 96–106)
Creatinine, Ser: 0.72 mg/dL (ref 0.57–1.00)
Globulin, Total: 2.6 g/dL (ref 1.5–4.5)
Glucose: 95 mg/dL (ref 70–99)
Potassium: 4.2 mmol/L (ref 3.5–5.2)
Sodium: 141 mmol/L (ref 134–144)
Total Protein: 7 g/dL (ref 6.0–8.5)
eGFR: 97 mL/min/1.73 (ref >59)

## 2024-10-12 LAB — TSH: TSH: 1.57 u[IU]/mL (ref 0.450–4.500)

## 2024-10-12 LAB — T4, FREE: Free T4: 1.5 ng/dL (ref 0.82–1.77)

## 2024-10-12 LAB — HEMOGLOBIN A1C
Est. average glucose Bld gHb Est-mCnc: 108 mg/dL
Hgb A1c MFr Bld: 5.4 % (ref 4.8–5.6)

## 2024-10-12 LAB — VITAMIN D 25 HYDROXY (VIT D DEFICIENCY, FRACTURES): Vit D, 25-Hydroxy: 63.9 ng/mL (ref 30.0–100.0)

## 2024-10-12 LAB — VITAMIN B12: Vitamin B-12: 1070 pg/mL (ref 232–1245)

## 2024-10-12 NOTE — Progress Notes (Signed)
  Office: [phone]  /  Fax: [phone]    Date: October 12, 2024  Appointment Start Time: 11:31am Duration: 29 minutes Provider: Wyatt Fire, Psy.D. Type of Session: Individual Therapy  Location of Patient: Home (private location) Location of Provider: [facility] Type of Contact: Telepsychological Visit via MyChart Video Visit  Session Content: [name] is a 58 y.o. female presenting for a follow-up appointment to address the previously established treatment goal of increasing coping skills. Today's appointment was a telepsychological visit. [name] provided verbal consent for today's telepsychological appointment and she is aware she is responsible for securing confidentiality on her end of the session. Prior to proceeding with today's appointment, [name]'s physical location at the time of this appointment was obtained as well a phone number she could be reached at in the event of technical difficulties. [name] and this provider participated in today's telepsychological service.   This provider conducted a brief check-in. [name] shared her son died tragically approximately two weeks ago. She further shared Dr. Midge recommended she focus on her well-being, specifically sleeping and eating something. Today's appointment focused on processing on grief. Overall, [name] was receptive to today's appointment as evidenced by openness to sharing and responsiveness to feedback.  Mental Status Examination:  Appearance: neat Behavior: appropriate to circumstances Mood: depressed Affect: mood congruent; tearful at times Speech: WNL Eye Contact: appropriate Psychomotor Activity: WNL Gait: unable to assess Thought Process: linear, logical, and goal directed and denies suicidal, homicidal, and self-harm ideation, plan and intent  Thought Content/Perception: no hallucinations, delusions, bizarre thinking or behavior endorsed or observed Orientation:  AAOx4 Memory/Concentration: intact Insight: good Judgment: good  Interventions:  Conducted a brief chart review Provided empathic reflections and validation Employed supportive psychotherapy interventions to facilitate reduced distress and to improve coping skills with identified stressors Psychoeducation provided regarding grieving process  DSM-5 Diagnosis(es): F50.89 Other Specified Feeding or Eating Disorder, Emotional Eating Behaviors, F41.9 Unspecified Anxiety Disorder and Z63.4 Uncomplicated Bereavement   Treatment Goal & Progress: During the initial appointment with this provider, the following treatment goal was established: increase coping skills. [name] has demonstrated progress in her goal as evidenced by increased awareness of hunger patterns, increased awareness of triggers for emotional eating behaviors, and reduction in emotional eating behaviors. [name] also continues to demonstrate willingness to engage in learned skills (e.g., urge surfing, mindful eating strategies).    Plan: The next appointment is scheduled for 10/27/2024 at 11:30am, which will be via MyChart Video Visit. The next session will focus on working towards the established treatment goal. [name] will continue with her primary therapist weekly.    Wyatt Fire, PsyD

## 2024-10-15 ENCOUNTER — Ambulatory Visit: Admitting: Psychology

## 2024-10-19 ENCOUNTER — Ambulatory Visit: Admitting: Sleep Medicine

## 2024-10-20 ENCOUNTER — Ambulatory Visit (INDEPENDENT_AMBULATORY_CARE_PROVIDER_SITE_OTHER): Admitting: Psychology

## 2024-10-20 DIAGNOSIS — F411 Generalized anxiety disorder: Secondary | ICD-10-CM | POA: Diagnosis not present

## 2024-10-20 DIAGNOSIS — F4321 Adjustment disorder with depressed mood: Secondary | ICD-10-CM | POA: Diagnosis not present

## 2024-10-20 NOTE — Progress Notes (Signed)
  Behavioral Health Counselor/Therapist Progress Note  Patient ID: [name], MRN: [id number],    Date: 10/20/24   Time Spent: 10:00am-10:39am  Treatment Type: Individual Therapy  Pt is seen for a virtual video visit via caregility. Pt consents to virtual visit and is aware of limitations of virtual visits.  Pt joins from her home, reporting privacy, and counselor from her home office.     Reported Symptoms: pt grieving, pt reports anxiety and worries.  Mental Status Exam: Appearance:  Well Groomed     Behavior: Appropriate  Motor: Normal  Speech/Language:  Normal Rate  Affect: Appropriate  Mood: anxious and sad  Thought process: normal  Thought content:   WNL  Sensory/Perceptual disturbances:   WNL  Orientation: oriented to person, place, time/date, and situation  Attention: Good  Concentration: Good  Memory: WNL  Fund of knowledge:  Good  Insight:   Good  Judgment:  Good  Impulse Control: Good   Risk Assessment: Danger to Self:  No Self-injurious Behavior: No Danger to Others: No Duty to Warn:no Physical Aggression / Violence:No  Access to Firearms a concern: No  Gang Involvement:No   Subjective: Counselor assessed pt current functioning per pt report.  Processed w/pt coping w/ grieving her son.  Validated and normalized grief reactions. Explored engaging in activities and allowing for time to acknowledge feelings.  Discussed her supports and staying connecting with her supports.  Explored interactions w/ daughter in law and ways to offer support and communication and potential of weekly routines of engaging for consistency.  Pt affect wnl.  Pt tearful at times.  Pt reports moments of anxiety and distraught when hits w/ permancy of death of her son.  Pt reports has returned to work.  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  Pt reports that connecting w/ supports who have also lost a child.  Pt discussed worry for communication and daughter  in law being withdrawn.  Pt reports has responded some but pattern has always been son initiating the connection.  Pt discussed potential of weekly routine of picking up grandkids from school and that as way of consistent connection and support and engagement.    Interventions: Cognitive Behavioral Therapy, Mindfulness Meditation, and supportive and self compassion  Diagnosis:Generalized anxiety disorder  Grief  Plan: Pt to f/u w/ Counseling in 1 week.  Pt to f/u w/ PCP, Cardiologist and [facility] as scheduled.   Individualized Treatment Plan Strengths: enjoys walking and movement, enjoys the beach, family is important to her  Supports: her husband, her daughter in law and her mom    Goal/Needs for Treatment:  In order of importance to patient 1) cope with anxiety 2) increase self worth and decrease negative thought patterns.  3) continue cope through losses       Client Statement of Needs:   talk about my grief, continue to work through my anxiety, try not be so negative on stuff and thinking things are going to go bad or wrong.  Finding time for self again.        Treatment Level: outpatient counseling  Symptoms:anxiety, worry, ruminating thoughts, low self worth, emotional escalations, depressed mood, sleep disturbance  Client Treatment Preferences: counseling every 2-4 weeks and continue medication management w/ PCP    Healthcare consumer's goal for treatment:   Counselor, Damien Herald, [facility] will support the patient's ability to achieve the goals identified. Cognitive Behavioral Therapy, Assertive Communication/Conflict Resolution Training, Relaxation Training, ACT,  Humanistic and other evidenced-based practices will be used to promote progress towards healthy functioning.    Healthcare consumer will: Actively participate in therapy, working towards healthy functioning.     *Justification for Continuation/Discontinuation of Goal: R=Revised, O=Ongoing,  A=Achieved, D=Discontinued   Goal 1) Pt will increase coping with life's anxiety and stressors AEB decreased ruminating worry, decreased emotional escalations, decreased negative thought patterns. That contribute to anxiety. Baseline date 03/10/24: Progress towards goal 40%; How Often - Daily Target Date Goal Was reviewed Status Code Progress towards goal  03/10/25                            Goal 2)  Increase self awareness, consistency w/ self care time and self compassion statements AEB pt report and therapist observation.   Baseline date 03/10/24: Progress towards goal 30; How Often - Daily Target Date Goal Was reviewed Status Code Progress towards goal/Likert rating  03/10/25                            Goal 3) Verbalize feelings of grief related to losses and recognize ways of living with grief present.  Baseline date 03/10/24: Progress towards goal 30; How Often - Daily Target Date Goal Was reviewed Status Code Progress towards goal/Likert rating  03/10/25                            This plan has been reviewed and created by the following participants:  This plan will be reviewed at least every 12 months. Date Behavioral Health Clinician Date Guardian/Patient   03/10/24 [facility]barann [facility]                      03/10/24 Verbal consent provided and request for electronic signature sent and obtained               [name] [facility]

## 2024-10-23 ENCOUNTER — Encounter: Payer: Self-pay | Admitting: Family Medicine

## 2024-10-23 MED ORDER — ALPRAZOLAM 1 MG PO TABS: 1.0000 mg | ORAL_TABLET | Freq: Two times a day (BID) | ORAL | 5 refills | Status: AC | PRN

## 2024-10-23 NOTE — Telephone Encounter (Signed)
 First of all, please tell her I am so sorry to hear about losing her son. I see Dr. Midge recently gave her Trazodone  100 mg to take at bedtime. I would ONLY take this at bedtime. However I did send in refills for the Xanax 

## 2024-10-26 ENCOUNTER — Ambulatory Visit: Admitting: Psychology

## 2024-10-27 ENCOUNTER — Telehealth (INDEPENDENT_AMBULATORY_CARE_PROVIDER_SITE_OTHER): Payer: Self-pay | Admitting: Psychology

## 2024-10-27 DIAGNOSIS — F5089 Other specified eating disorder: Secondary | ICD-10-CM | POA: Diagnosis not present

## 2024-10-27 DIAGNOSIS — Z634 Disappearance and death of family member: Secondary | ICD-10-CM | POA: Diagnosis not present

## 2024-10-27 DIAGNOSIS — F419 Anxiety disorder, unspecified: Secondary | ICD-10-CM

## 2024-10-27 NOTE — Progress Notes (Signed)
  Office: [phone]  /  Fax: [phone]    Date: October 27, 2024  Appointment Start Time: 11:33am Duration: 30 minutes Provider: Wyatt Fire, Psy.D. Type of Session: Individual Therapy  Location of Patient: Home (private location) Location of Provider: [facility] Type of Contact: Telepsychological Visit via MyChart Video Visit  Session Content: [name] is a 58 y.o. female presenting for a follow-up appointment to address the previously established treatment goal of increasing coping skills. Today's appointment was a telepsychological visit. [name] provided verbal consent for today's telepsychological appointment and she is aware she is responsible for securing confidentiality on her end of the session. Prior to proceeding with today's appointment, [name]'s physical location at the time of this appointment was obtained as well a phone number she could be reached at in the event of technical difficulties. [name] and this provider participated in today's telepsychological service.   This provider conducted a brief check-in. [name] shared, It's been up and down. More specifically, she shared she does not want to believe her son passed away. She further shared she is trying to re-focus on making better choices and engaging in portion control, but acknowledged grief has impacted consistency. Remainder of today's appointment focused on grief by writing a goodbye letter to her son. Overall, [name] was receptive to today's appointment as evidenced by openness to sharing, responsiveness to feedback, and willingness to implement discussed strategies .  Mental Status Examination:  Appearance: neat Behavior: appropriate to circumstances Mood: sad Affect: mood congruent; tearful at times Speech: WNL Eye Contact: appropriate Psychomotor Activity: WNL Gait: unable to assess Thought Process: linear, logical, and goal directed and no evidence or endorsement of suicidal, homicidal,  and self-harm ideation, plan and intent  Thought Content/Perception: no hallucinations, delusions, bizarre thinking or behavior endorsed or observed Orientation: AAOx4 Memory/Concentration: intact Insight: good Judgment: good  Interventions:  Conducted a brief chart review Provided empathic reflections and validation Reviewed content from the previous session Employed supportive psychotherapy interventions to facilitate reduced distress and to improve coping skills with identified stressors  DSM-5 Diagnosis(es): F50.89 Other Specified Feeding or Eating Disorder, Emotional Eating Behaviors, F41.9 Unspecified Anxiety Disorder and Z63.4 Uncomplicated Bereavement   Treatment Goal & Progress: During the initial appointment with this provider, the following treatment goal was established: increase coping skills. [name] has demonstrated progress in her goal as evidenced by increased awareness of hunger patterns, increased awareness of triggers for emotional eating behaviors, and reduction in emotional eating behaviors. [name] also continues to demonstrate willingness to engage in learned skills (e.g., urge surfing, mindful eating strategies).    Plan: The next appointment is scheduled for 11/09/2024 at 3:30pm, which will be via MyChart Video Visit. The next session will focus on working towards the established treatment goal. [name] will continue with her primary therapist.    Wyatt Fire, PsyD

## 2024-10-30 ENCOUNTER — Ambulatory Visit (INDEPENDENT_AMBULATORY_CARE_PROVIDER_SITE_OTHER): Admitting: Obstetrics and Gynecology

## 2024-10-30 ENCOUNTER — Encounter: Payer: Self-pay | Admitting: Obstetrics and Gynecology

## 2024-10-30 VITALS — BP 109/66 | HR 74

## 2024-10-30 DIAGNOSIS — N8110 Cystocele, unspecified: Secondary | ICD-10-CM | POA: Diagnosis not present

## 2024-10-30 NOTE — Progress Notes (Signed)
 [facility] Return Visit  SUBJECTIVE  History of Present Illness: [name] is a 58 y.o. female seen in follow-up for OAB, bowel leakage and prolapse.  She was on the trospium  for about a week then stopped as unfortunately her son passed away. She is not many accidents but occasionally has some difficulty making it to the bathroom.  Drinks 2 cups coffee in AM. Usually drinks water  after, sometimes a coke zero.   She started taking the fiber supplement (Benefiber) daily and has not noticed any more bowel leaking.  Has more discomfort during sex with the prolapse. Feels it has dropped more.    Past Medical History: Patient  has a past medical history of ADHD, Allergy, Anxiety (2014), Back pain, CAD (coronary artery disease), Constipated, Depression, Fatty liver, GERD (gastroesophageal reflux disease) (2000), History of abnormal cervical Pap smear, History of colon polyps, Hydronephrosis, left, Hyperlipidemia (2024), Kidney stones (2016), OSA (obstructive sleep apnea), PONV (postoperative nausea and vomiting), RLS (restless legs syndrome), Rosacea, and Vitamin D  deficiency.   Past Surgical History: She  has a past surgical history that includes Laparoscopic cholecystectomy (1999); Tubal ligation (Bilateral, 1995); Cystoscopy w/ ureteral stent placement (01/1999); Extracorporeal shock wave lithotripsy (2001  &  2002); DX LAPAROSCOPY (X2); EXPLORATORY LAPAROTOMY W/ BILATERAL SALPINGECTOMY AND REMOVAL RIGHT ECTOPIC PREG. (02/23/2004); TOTAL ABDOMINAL HYSTERECTOMY W/ BILATERAL OOPHORECTOMY AND LYSIS ADHESIONS (09/02/2007); Esophagogastroduodenoscopy (05/09/2007); Umbilical hernia repair (2003); Shoulder arthroscopy with open rotator cuff repair (Right, 2012); Cystoscopy with ureteroscopy and stent placement (Left, 05/18/2015); Cystoscopy with holmium laser lithotripsy (Left, 05/18/2015); Cystoscopy with retrograde pyelogram, ureteroscopy and stent placement (Left, 06/22/2015);  Cystoscopy w/ retrogrades (Bilateral, 06/22/2015); Abdominal hysterectomy (2008); Diagnostic laparoscopy; Anterior and posterior repair with sacrospinous fixation (N/A, 08/16/2015); Bladder suspension (N/A, 08/16/2015); Cystoscopy (N/A, 08/16/2015); Vaginal hysterectomy (N/A, 08/18/2015); Polypectomy; Colonoscopy (08/21/2017); Robotic assisted laparoscopic sacrocolpopexy (N/A, 12/12/2022); Cystoscopy (N/A, 12/12/2022); Cystocele repair (N/A, 12/12/2022); and Cholecystectomy (1999).   Medications: She has a current medication list which includes the following prescription(s): alprazolam , aspirin  ec, atorvastatin , bupropion , cyanocobalamin , estradiol , metformin , nitroglycerin , trazodone , trospium  chloride, and vitamin d  (ergocalciferol ).   Allergies: Patient is allergic to desvenlafaxine and prochlorperazine .   Social History: Patient  reports that she quit smoking about 16 years ago. Her smoking use included cigarettes. She started smoking about 31 years ago. She has never used smokeless tobacco. She reports that she does not drink alcohol and does not use drugs.     OBJECTIVE     Physical Exam: Vitals:   10/30/24 0805 10/30/24 0808  BP: (!) 134/91 109/66  Pulse: 74 74   Gen: No apparent distress, A&O x 3.  Detailed Urogynecologic Evaluation:  Normal external genitalia. On speculum, vaginal mucosa with atrophy. On bimanual, no masses present.   POP-Q  0                                            Aa   0                                           Ba  -7.5  C   4                                            Gh  5                                            Pb  8                                            tvl   -3                                            Ap  -3                                            Bp                                                 D       ASSESSMENT AND PLAN    Ms. [name] is a 58 y.o. with:  1. Prolapse  of anterior vaginal wall     - She has isolated anterior prolapse recurrence. At the time of prior sacrocolpopexy, there was some scarring anteriorly which prevented mesh placement all the way to the level of the urethra and an anterior repair was done. However this failed and she is interested in surgical correction. We discussed the option of repeating the sacrocolpopexy with just the anterior leaflet, or if that is not possible, trying a native tissue approach with or without biologic graft anteriorly. Patient is in agreement with proceeding.   Plan for surgery: Exam under anesthesia, revision robotic assisted sacrocolpopexy, possible anterior repair, possible paravaginal repair, possible sacrospinous fixation, possible placement of biologic graft, cystoscopy  - We reviewed the patient's specific anatomic and functional findings, with the assistance of diagrams, and together finalized the above procedure. The planned surgical procedures were discussed along with the surgical risks outlined below, which were also provided on a detailed handout. Additional treatment options including expectant management, conservative management, medical management were discussed where appropriate.  We reviewed the benefits and risks of each treatment option.   General Surgical Risks: For all procedures, there are risks of bleeding, infection, damage to surrounding organs including but not limited to bowel, bladder, blood vessels, ureters and nerves, and need for further surgery if an injury were to occur. These risks are all low with minimally invasive surgery.   There are risks of numbness and weakness at any body site or buttock/rectal pain.  It is possible that baseline pain can be worsened by surgery, either with or without mesh. If surgery is vaginal, there is also a low risk of possible conversion to laparoscopy or open abdominal incision where indicated. Very rare risks include blood  transfusion, blood clot,  heart attack, pneumonia, or death.   There is also a risk of short-term postoperative urinary retention with need to use a catheter. About half of patients need to go home from surgery with a catheter, which is then later removed in the office. The risk of long-term need for a catheter is very low. There is also a risk of worsening of overactive bladder.    Prolapse (with or without mesh): Risk factors for surgical failure  include things that put pressure on your pelvis and the surgical repair, including obesity, chronic cough, and heavy lifting or straining (including lifting children or adults, straining on the toilet, or lifting heavy objects such as furniture or anything weighing >25 lbs. Risks of recurrence is 20-30% with vaginal native tissue repair and a less than 10% with sacrocolpopexy with mesh.    Sacrocolpopexy: Mesh implants may provide more prolapse support, but do have some unique risks to consider. It is important to understand that mesh is permanent and cannot be easily removed. Risks of abdominal sacrocolpopexy mesh include mesh exposure (~3-6%), painful intercourse (recent studies show lower rates after surgery compared to before, with ~5-8% risk of new onset), and very rare risks of bowel or bladder injury or infection (<1%). The risk of mesh exposure is more likely in a woman with risks for poor healing (prior radiation, poorly controlled diabetes, or immunocompromised). The risk of new or worsened chronic pain after mesh implant is more common in women with baseline chronic pain and/or poorly controlled anxiety or depression. There is an FDA safety notification on vaginal mesh procedures for prolapse but NOT abdominal mesh procedures and therefore does not apply to your surgery. We have extensive experience and training with mesh placement and we have close postoperative follow up to identify any potential complications from mesh.    - For preop Visit:  She is required to have a  visit within 30 days of her surgery.   - Medical clearance: not required  - Anticoagulant use: No - Medicaid Hysterectomy form: No - Accepts blood transfusion: Yes - Expected length of stay: outpatient  Request sent for surgery scheduling.   Rosaline LOISE Caper, MD

## 2024-11-02 ENCOUNTER — Ambulatory Visit: Admitting: Psychology

## 2024-11-02 DIAGNOSIS — F411 Generalized anxiety disorder: Secondary | ICD-10-CM | POA: Diagnosis not present

## 2024-11-02 DIAGNOSIS — F4321 Adjustment disorder with depressed mood: Secondary | ICD-10-CM

## 2024-11-02 NOTE — Progress Notes (Signed)
 [facility] Counselor/Therapist Progress Note  Patient ID: [name], MRN: [id number],    Date: 11/02/24   Time Spent: 12:02pm-12:54pm  Treatment Type: Individual Therapy  Pt is seen for a virtual video visit via caregility. Pt consents to virtual visit and is aware of limitations of virtual visits.  Pt joins from her home, reporting privacy, and counselor from her office.     Reported Symptoms: pt reports grieving and increased anxiety.  Mental Status Exam: Appearance:  Well Groomed     Behavior: Appropriate  Motor: Normal  Speech/Language:  Normal Rate  Affect: Appropriate  Mood: anxious and sad  Thought process: normal  Thought content:   WNL  Sensory/Perceptual disturbances:   WNL  Orientation: oriented to person, place, time/date, and situation  Attention: Good  Concentration: Good  Memory: WNL  Fund of knowledge:  Good  Insight:   Good  Judgment:  Good  Impulse Control: Good   Risk Assessment: Danger to Self:  No Self-injurious Behavior: No Danger to Others: No Duty to Warn:no Physical Aggression / Violence:No  Access to Firearms a concern: No  Gang Involvement:No   Subjective: Counselor assessed pt current functioning per pt report.  Processed w/pt her grief and anxiety. Validated and normalized grief reactions and how their are differences and similarities in grieving in her family. Discussed ways feels connected and comforted to her son.  Explored upcoming birthday and holidays and decisions for planning how spending these days. Pt affect congruent w/ emotions reporting- tearful at times.  Pt reports moments of anxiety coming home and feeling triggered.  Pt reports she keeps relieving the phone call that day.  Pt reports on time w/ grandkids recent and how positive.  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  Pt reports she is able to express her feelings w/ her mom and at times her husband.  Pt discussed differences on what feels  comforting vs. Triggering for her and husband.  Pt reports that her birthday is the weekend and w/ holidays coming up struggling to know how to spend this time.  Pt able to process and increase awareness of options and what may work for her and family.  Pt is looking forward to ehr mom's visit again.     Interventions: Cognitive Behavioral Therapy and supportive and self compassion  Diagnosis:Generalized anxiety disorder  Grief  Plan: Pt to f/u w/ Counseling in 1 week.  Pt to f/u w/ PCP, Cardiologist and [facility] as scheduled.   Individualized Treatment Plan Strengths: enjoys walking and movement, enjoys the beach, family is important to her  Supports: her husband, her daughter in law and her mom    Goal/Needs for Treatment:  In order of importance to patient 1) cope with anxiety 2) increase self worth and decrease negative thought patterns.  3) continue cope through losses       Client Statement of Needs:   talk about my grief, continue to work through my anxiety, try not be so negative on stuff and thinking things are going to go bad or wrong.  Finding time for self again.        Treatment Level: outpatient counseling  Symptoms:anxiety, worry, ruminating thoughts, low self worth, emotional escalations, depressed mood, sleep disturbance  Client Treatment Preferences: counseling every 2-4 weeks and continue medication management w/ PCP    Healthcare consumer's goal for treatment:   Counselor, Damien Herald, [facility] will support the patient's ability to achieve the goals  identified. Cognitive Behavioral Therapy, Assertive Communication/Conflict Resolution Training, Relaxation Training, ACT, Humanistic and other evidenced-based practices will be used to promote progress towards healthy functioning.    Healthcare consumer will: Actively participate in therapy, working towards healthy functioning.     *Justification for Continuation/Discontinuation of Goal: R=Revised,  O=Ongoing, A=Achieved, D=Discontinued   Goal 1) Pt will increase coping with life's anxiety and stressors AEB decreased ruminating worry, decreased emotional escalations, decreased negative thought patterns. That contribute to anxiety. Baseline date 03/10/24: Progress towards goal 40%; How Often - Daily Target Date Goal Was reviewed Status Code Progress towards goal  03/10/25                            Goal 2)  Increase self awareness, consistency w/ self care time and self compassion statements AEB pt report and therapist observation.   Baseline date 03/10/24: Progress towards goal 30; How Often - Daily Target Date Goal Was reviewed Status Code Progress towards goal/Likert rating  03/10/25                            Goal 3) Verbalize feelings of grief related to losses and recognize ways of living with grief present.  Baseline date 03/10/24: Progress towards goal 30; How Often - Daily Target Date Goal Was reviewed Status Code Progress towards goal/Likert rating  03/10/25                            This plan has been reviewed and created by the following participants:  This plan will be reviewed at least every 12 months. Date Behavioral Health Clinician Date Guardian/Patient   03/10/24 [facility] Barbarann [facility]                      03/10/24 Verbal consent provided and request for electronic signature sent and obtained             [name] [facility]

## 2024-11-05 ENCOUNTER — Encounter: Payer: Self-pay | Admitting: Obstetrics and Gynecology

## 2024-11-09 ENCOUNTER — Telehealth (INDEPENDENT_AMBULATORY_CARE_PROVIDER_SITE_OTHER): Admitting: Psychology

## 2024-11-09 DIAGNOSIS — F5089 Other specified eating disorder: Secondary | ICD-10-CM | POA: Diagnosis not present

## 2024-11-09 DIAGNOSIS — Z634 Disappearance and death of family member: Secondary | ICD-10-CM | POA: Diagnosis not present

## 2024-11-09 DIAGNOSIS — F419 Anxiety disorder, unspecified: Secondary | ICD-10-CM

## 2024-11-09 NOTE — Progress Notes (Signed)
  Office: [phone]  /  Fax: [phone]    Date: November 09, 2024  Appointment Start Time: 3:36pm Duration: 34 minutes Provider: Wyatt Fire, Psy.D. Type of Session: Individual Therapy  Location of Patient: Home (private location) Location of Provider: Provider's Home (private office) Type of Contact: Telepsychological Visit via MyChart Video Visit  Session Content: [name] is a 58 y.o. female presenting for a follow-up appointment to address the previously established treatment goal of increasing coping skills. Today's appointment was a telepsychological visit. [name] provided verbal consent for today's telepsychological appointment and she is aware she is responsible for securing confidentiality on her end of the session. Prior to proceeding with today's appointment, [name]'s physical location at the time of this appointment was obtained as well a phone number she could be reached at in the event of technical difficulties. [name] and this provider participated in today's telepsychological service.   This provider conducted a brief check-in. [name] shared about recent events, including wondering if she needs to seek specific grief counseling. Further explored and processed. This provider shared she will be leaving the practice mid-December. [name]'s thoughts/feelings were explored and processed.  She expressed desire to continue with this provider in the future. Moreover, this provider discussed journaling as a coping strategy. She was encouraged writing down her feelings and thoughts to process them more deeply as they related to loss and current challenges. [name] agreed. Overall, [name] was receptive to today's appointment as evidenced by openness to sharing, responsiveness to feedback, and willingness to implement discussed strategies .  Mental Status Examination:  Appearance: neat Behavior: appropriate to circumstances Mood: sad Affect: mood congruent; tearful at times  Speech:  WNL Eye Contact: appropriate Psychomotor Activity: WNL Gait: unable to assess Thought Process: linear, logical, and goal directed and denies suicidal, homicidal, and self-harm ideation, plan and intent  Thought Content/Perception: no hallucinations, delusions, bizarre thinking or behavior endorsed or observed Orientation: AAOx4 Memory/Concentration: intact Insight: good Judgment: good  Interventions:  Conducted a brief chart review Provided empathic reflections and validation Reviewed content from the previous session Provided positive reinforcement Employed supportive psychotherapy interventions to facilitate reduced distress and to improve coping skills with identified stressors Discussed termination  DSM-5 Diagnosis(es): F50.89 Other Specified Feeding or Eating Disorder, Emotional Eating Behaviors, F41.9 Unspecified Anxiety Disorder and Z63.4 Uncomplicated Bereavement   Treatment Goal & Progress:  During the initial appointment with this provider, the following treatment goal was established: increase coping skills. [name] has demonstrated progress in her goal as evidenced by increased awareness of hunger patterns, increased awareness of triggers for emotional eating behaviors, and reduction in emotional eating behaviors. [name] also continues to demonstrate willingness to engage in learned skills (e.g., urge surfing, mindful eating strategies).    Plan: The next appointment is scheduled for 11/16/2024 at 11:30am, which will be via MyChart Video Visit. The next session will focus on working towards the established treatment goal. [name] will continue with her primary therapist and discuss possible transfer to this provider in the new year.    Wyatt Fire, PsyD

## 2024-11-11 ENCOUNTER — Ambulatory Visit: Admitting: Psychology

## 2024-11-11 DIAGNOSIS — F4321 Adjustment disorder with depressed mood: Secondary | ICD-10-CM

## 2024-11-11 DIAGNOSIS — F411 Generalized anxiety disorder: Secondary | ICD-10-CM | POA: Diagnosis not present

## 2024-11-11 NOTE — Progress Notes (Signed)
 [facility] Counselor/Therapist Progress Note  Patient ID: Vanissa Strength, MRN: [id number],    Date: 11/11/24   Time Spent: 8:00am-8:43am  Treatment Type: Individual Therapy  Pt is seen for a virtual video visit via caregility. Pt consents to virtual visit and is aware of limitations of virtual visits.  Pt joins from her home, reporting privacy, and counselor from her home office.     Reported Symptoms: pt reports grieving and anxiety.  Mental Status Exam: Appearance:  Well Groomed     Behavior: Appropriate  Motor: Normal  Speech/Language:  Normal Rate  Affect: Appropriate  Mood: anxious and sad  Thought process: normal  Thought content:   WNL  Sensory/Perceptual disturbances:   WNL  Orientation: oriented to person, place, time/date, and situation  Attention: Good  Concentration: Good  Memory: WNL  Fund of knowledge:  Good  Insight:   Good  Judgment:  Good  Impulse Control: Good   Risk Assessment: Danger to Self:  No Self-injurious Behavior: No Danger to Others: No Duty to Warn:no Physical Aggression / Violence:No  Access to Firearms a concern: No  Gang Involvement:No   Subjective: Counselor assessed pt current functioning per pt report.  Processed w/pt her grief and anxiety. Explored interactions w/ family and how expressing grief.  Assisted w/ identifying supports that able to verbalize feelings and not mask.  Discussed ways feels connected w/ son. Explored upcoming holidays and engaging w/ grandkids and mom.  Pt affect congruent w/ emotions-tearful at times.  Pt reports some increased anxiety and tearfulness with grief of son. Pt reports how initial support /checking in decreased.  Pt reports that able to talk w/ mom and w/ daughter in law and be authentic w/.  Pt also discussed how brother reach out recently.  Pt discussed how lawyer assisting, [employer] investigating his death.  Pt discussed guilt of not being able to protect her son and recognize  distortion that couldn't have. Pt reports positive of interactions w/ her grandkids and feeling support.    Interventions: Cognitive Behavioral Therapy and supportive and self compassion  Diagnosis:Generalized anxiety disorder  Grief  Plan: Pt to f/u w/ Counseling in 2 weeks.  Pt to f/u w/ PCP, Cardiologist and [facility] as scheduled.   Individualized Treatment Plan Strengths: enjoys walking and movement, enjoys the beach, family is important to her  Supports: her husband, her daughter in law and her mom    Goal/Needs for Treatment:  In order of importance to patient 1) cope with anxiety 2) increase self worth and decrease negative thought patterns.  3) continue cope through losses       Client Statement of Needs:   talk about my grief, continue to work through my anxiety, try not be so negative on stuff and thinking things are going to go bad or wrong.  Finding time for self again.        Treatment Level: outpatient counseling  Symptoms:anxiety, worry, ruminating thoughts, low self worth, emotional escalations, depressed mood, sleep disturbance  Client Treatment Preferences: counseling every 2-4 weeks and continue medication management w/ PCP    Healthcare consumer's goal for treatment:   Counselor, Damien Herald, [facility] will support the patient's ability to achieve the goals identified. Cognitive Behavioral Therapy, Assertive Communication/Conflict Resolution Training, Relaxation Training, ACT, Humanistic and other evidenced-based practices will be used to promote progress towards healthy functioning.    Healthcare consumer will: Actively participate in therapy, working towards healthy functioning.     *  Justification for Continuation/Discontinuation of Goal: R=Revised, O=Ongoing, A=Achieved, D=Discontinued   Goal 1) Pt will increase coping with life's anxiety and stressors AEB decreased ruminating worry, decreased emotional escalations, decreased negative  thought patterns. That contribute to anxiety. Baseline date 03/10/24: Progress towards goal 40%; How Often - Daily Target Date Goal Was reviewed Status Code Progress towards goal  03/10/25                            Goal 2)  Increase self awareness, consistency w/ self care time and self compassion statements AEB pt report and therapist observation.   Baseline date 03/10/24: Progress towards goal 30; How Often - Daily Target Date Goal Was reviewed Status Code Progress towards goal/Likert rating  03/10/25                            Goal 3) Verbalize feelings of grief related to losses and recognize ways of living with grief present.  Baseline date 03/10/24: Progress towards goal 30; How Often - Daily Target Date Goal Was reviewed Status Code Progress towards goal/Likert rating  03/10/25                            This plan has been reviewed and created by the following participants:  This plan will be reviewed at least every 12 months. Date Behavioral Health Clinician Date Guardian/Patient   03/10/24 [facility] Barbarann [facility]                      03/10/24 Verbal consent provided and request for electronic signature sent and obtained           [name] [facility]

## 2024-11-12 ENCOUNTER — Ambulatory Visit (INDEPENDENT_AMBULATORY_CARE_PROVIDER_SITE_OTHER): Admitting: Family Medicine

## 2024-11-12 ENCOUNTER — Encounter (INDEPENDENT_AMBULATORY_CARE_PROVIDER_SITE_OTHER): Payer: Self-pay

## 2024-11-16 ENCOUNTER — Telehealth (INDEPENDENT_AMBULATORY_CARE_PROVIDER_SITE_OTHER): Admitting: Psychology

## 2024-11-23 ENCOUNTER — Telehealth (INDEPENDENT_AMBULATORY_CARE_PROVIDER_SITE_OTHER): Admitting: Psychology

## 2024-11-23 DIAGNOSIS — F5089 Other specified eating disorder: Secondary | ICD-10-CM | POA: Diagnosis not present

## 2024-11-23 DIAGNOSIS — F419 Anxiety disorder, unspecified: Secondary | ICD-10-CM

## 2024-11-23 DIAGNOSIS — Z634 Disappearance and death of family member: Secondary | ICD-10-CM

## 2024-11-23 NOTE — Progress Notes (Signed)
  Office: [phone]  /  Fax: [phone]    Date: November 23, 2024  Appointment Start Time: 12:04pm Duration: 33 minutes Provider: Wyatt Fire, Psy.D. Type of Session: Individual Therapy  Location of Patient: Home (private location) Location of Provider: Provider's Home (private office) Type of Contact: Telepsychological Visit via MyChart Video Visit  Session Content: [name] is a 58 y.o. female presenting for a follow-up appointment to address the previously established treatment goal of increasing coping skills. Today's appointment was a telepsychological visit. [name] provided verbal consent for today's telepsychological appointment and she is aware she is responsible for securing confidentiality on her end of the session. Prior to proceeding with today's appointment, [name]'s physical location at the time of this appointment was obtained as well a phone number she could be reached at in the event of technical difficulties. [name] and this provider participated in today's telepsychological service.   This provider conducted a brief check-in. [name] stated she started journal as discussed during the last appointment with this provider. She indicated she keeps thinking about it but is unsure how to do it correctly. Further explored and processed. She was engaged in problem solving, including habit stacking, to help with the concerns identified related to journaling. Overall, Overall, [name] was receptive to today's appointment as evidenced by openness to sharing, responsiveness to feedback, and willingness to continue engaging in learned skills.  Mental Status Examination:  Appearance: neat Behavior: appropriate to circumstances Mood: sad Affect: mood congruent; tearful at times Speech: WNL Eye Contact: appropriate Psychomotor Activity: WNL Gait: unable to assess Thought Process: linear, logical, and goal directed and no evidence or endorsement of suicidal, homicidal, and self-harm  ideation, plan and intent  Thought Content/Perception: no hallucinations, delusions, bizarre thinking or behavior endorsed or observed Orientation: AAOx4 Memory/Concentration: intact Insight: good Judgment: good  Interventions:  Conducted a brief chart review Provided empathic reflections and validation Reviewed content from the previous session Provided positive reinforcement Employed supportive psychotherapy interventions to facilitate reduced distress and to improve coping skills with identified stressors Engaged patient in problem solving  DSM-5 Diagnosis(es): F50.89 Other Specified Feeding or Eating Disorder, Emotional Eating Behaviors, F41.9 Unspecified Anxiety Disorder and Z63.4 Uncomplicated Bereavement   Treatment Goal & Progress: During the initial appointment with this provider, the following treatment goal was established: increase coping skills. [name] has demonstrated progress in her goal as evidenced by increased awareness of hunger patterns, increased awareness of triggers for emotional eating behaviors, and reduction in emotional eating behaviors. [name] also continues to demonstrate willingness to engage in learned skills (e.g., urge surfing, mindful eating strategies).     Plan: The next appointment is scheduled for 12/07/2024 at 12:30pm, which will be via MyChart Video Visit. The next session will focus on working towards the established treatment goal. [name] plans to re-establish care with [facility] in the new year. As such, a referral was placed for the transfer.    Wyatt Fire, PsyD

## 2024-11-24 ENCOUNTER — Ambulatory Visit: Admitting: Psychology

## 2024-11-24 DIAGNOSIS — F4321 Adjustment disorder with depressed mood: Secondary | ICD-10-CM

## 2024-11-24 DIAGNOSIS — F411 Generalized anxiety disorder: Secondary | ICD-10-CM

## 2024-11-24 NOTE — Progress Notes (Signed)
 [facility] Counselor/Therapist Progress Note  Patient ID: [name], MRN: [id number],    Date: 11/24/24   Time Spent: 12:00pm-12:55pm  Treatment Type: Individual Therapy  Pt is seen for a virtual video visit via caregility. Pt consents to virtual visit and is aware of limitations of virtual visits.  Pt joins from her home, reporting privacy, and counselor from her home office.     Reported Symptoms: pt reports grieving and anxiety.  Mental Status Exam: Appearance:  Well Groomed     Behavior: Appropriate  Motor: Normal  Speech/Language:  Normal Rate  Affect: Appropriate  Mood: anxious and sad  Thought process: normal  Thought content:   WNL  Sensory/Perceptual disturbances:   WNL  Orientation: oriented to person, place, time/date, and situation  Attention: Good  Concentration: Good  Memory: WNL  Fund of knowledge:  Good  Insight:   Good  Judgment:  Good  Impulse Control: Good   Risk Assessment: Danger to Self:  No Self-injurious Behavior: No Danger to Others: No Duty to Warn:no Physical Aggression / Violence:No  Access to Firearms a concern: No  Gang Involvement:No   Subjective: Counselor assessed pt current functioning per pt report.  Processed w/pt her grief and anxiety. Explored interactions w/ husband and grandkids.  Assisted w/ identifying distortions re: interactions w/ husband and his distance.  Assisted w/ reframing and recognizing can't fix his grief and ways to be present and communicate w.  Discussed ways can talk w/ granddaughter and active listening with.  Pt affect congruent w/ emotions-tearful at times.  Pt reports positive visit w/her mother and thanksgving w/ grandkids.  Pt discussed grief and moments of escalation emotions and tearfulness.  Pt reflected don how she can't be strong for everyone and ok to express emotions through tears.  Pt reports feeling distance from daughter ain law and form husband.  Pt worries expressed  that his distance means marriage in trouble.  Pt able to reflect on how wants to help husband but can't fix his grief and doesn't need to.  Pt receptive of ways to communicate w/ granddaughter who talks to her about dad a lot.  Interventions: Cognitive Behavioral Therapy and supportive and self compassion  Diagnosis:Generalized anxiety disorder  Grief  Mild episode of recurrent major depressive disorder  Plan: Pt to f/u w/ Counseling in 2 weeks.  Pt to f/u w/ PCP, Cardiologist and [facility] as scheduled.   Individualized Treatment Plan Strengths: enjoys walking and movement, enjoys the beach, family is important to her  Supports: her husband, her daughter in law and her mom    Goal/Needs for Treatment:  In order of importance to patient 1) cope with anxiety 2) increase self worth and decrease negative thought patterns.  3) continue cope through losses       Client Statement of Needs:   talk about my grief, continue to work through my anxiety, try not be so negative on stuff and thinking things are going to go bad or wrong.  Finding time for self again.        Treatment Level: outpatient counseling  Symptoms:anxiety, worry, ruminating thoughts, low self worth, emotional escalations, depressed mood, sleep disturbance  Client Treatment Preferences: counseling every 2-4 weeks and continue medication management w/ PCP    Healthcare consumer's goal for treatment:   Counselor, Damien Herald, [facility] will support the patient's ability to achieve the goals identified. Cognitive Behavioral Therapy, Assertive Communication/Conflict Resolution Training, Relaxation Training, ACT, Humanistic and  other evidenced-based practices will be used to promote progress towards healthy functioning.    Healthcare consumer will: Actively participate in therapy, working towards healthy functioning.     *Justification for Continuation/Discontinuation of Goal: R=Revised, O=Ongoing, A=Achieved,  D=Discontinued   Goal 1) Pt will increase coping with life's anxiety and stressors AEB decreased ruminating worry, decreased emotional escalations, decreased negative thought patterns. That contribute to anxiety. Baseline date 03/10/24: Progress towards goal 40%; How Often - Daily Target Date Goal Was reviewed Status Code Progress towards goal  03/10/25                            Goal 2)  Increase self awareness, consistency w/ self care time and self compassion statements AEB pt report and therapist observation.   Baseline date 03/10/24: Progress towards goal 30; How Often - Daily Target Date Goal Was reviewed Status Code Progress towards goal/Likert rating  03/10/25                            Goal 3) Verbalize feelings of grief related to losses and recognize ways of living with grief present.  Baseline date 03/10/24: Progress towards goal 30; How Often - Daily Target Date Goal Was reviewed Status Code Progress towards goal/Likert rating  03/10/25                            This plan has been reviewed and created by the following participants:  This plan will be reviewed at least every 12 months. Date Behavioral Health Clinician Date Guardian/Patient   03/10/24 [name] [name]                      03/10/24 Verbal consent provided and request for electronic signature sent and obtained           [name] [facility]

## 2024-11-26 ENCOUNTER — Encounter (INDEPENDENT_AMBULATORY_CARE_PROVIDER_SITE_OTHER): Payer: Self-pay | Admitting: Family Medicine

## 2024-11-26 ENCOUNTER — Ambulatory Visit (INDEPENDENT_AMBULATORY_CARE_PROVIDER_SITE_OTHER): Admitting: Family Medicine

## 2024-11-26 VITALS — BP 134/85 | HR 83 | Temp 97.8°F | Ht 60.0 in | Wt 160.0 lb

## 2024-11-26 DIAGNOSIS — Z634 Disappearance and death of family member: Secondary | ICD-10-CM

## 2024-11-26 DIAGNOSIS — E669 Obesity, unspecified: Secondary | ICD-10-CM

## 2024-11-26 DIAGNOSIS — R7303 Prediabetes: Secondary | ICD-10-CM

## 2024-11-26 DIAGNOSIS — Z6827 Body mass index (BMI) 27.0-27.9, adult: Secondary | ICD-10-CM

## 2024-11-26 DIAGNOSIS — F5089 Other specified eating disorder: Secondary | ICD-10-CM

## 2024-11-26 DIAGNOSIS — E559 Vitamin D deficiency, unspecified: Secondary | ICD-10-CM

## 2024-11-26 MED ORDER — VITAMIN D (ERGOCALCIFEROL) 1.25 MG (50000 UNIT) PO CAPS: 50000.0000 [IU] | ORAL_CAPSULE | ORAL | 0 refills | Status: AC

## 2024-11-26 MED ORDER — METFORMIN HCL ER 500 MG PO TB24: 1000.0000 mg | ORAL_TABLET | Freq: Every day | ORAL | 1 refills | Status: AC

## 2024-11-26 NOTE — Progress Notes (Signed)
 [name], D.O.  ABFM, ABOM Specializing in Clinical Bariatric Medicine  Office located at: [address] [place], [place]  [address]      A) FOR THE CHRONIC DISEASE OF OBESITY:  Chief complaint: Obesity [name] is here to discuss her progress with her obesity treatment plan.   History of present illness / Interval history:  [name] is here today for her follow-up office visit.  Since last OV on 10/08/24, pt is up 3 lbs. Patient states that she is feeling better. She is working on the truck and it keeps her busy. She endorses eating protein shakes occasionally.     10/08/24 08:00 11/26/24   Body Fat % 36.5 % 38 %  Muscle Mass (lbs) 94.8 lbs 94.6 lbs  Fat Mass (lbs) 57.4 lbs 61.2 lbs  Total Body Water  (lbs) 66.6 lbs 68 lbs  Visceral Fat Rating  8 8    Counseling done on how various foods will affect these numbers and how to maximize success.   Total lbs lost to date: - 29 lbs Total Fat Mass in lbs lost to date: - 17.6 lbs Total weight loss percentage to date: - 15.34 %    Obesity (BMI 30-39.9) - Starting BMI 30.51 BMI 27.0-27.9,adult -- Current BMI 25.84  Nutrition Therapy She is journaling 1000-1100 calories with 80+ g lean protein and states she is following her eating plan approximately 0 % of the time.   - Tracking Calories/Macros: no  - Eating More Whole Foods: no   - Adequate Protein Intake: yes  - Adequate Water  Intake: yes  - Skipping Meals: yes  - Sleeping 7-9 Hours/ Night: no    [name] is currently in the action stage of change. As such, her goal is to continue weight management plan.  She has agreed to: continue current plan   Physical Activity [name] is not exercising.   [name] has been advised to work up to 300-450 minutes of moderate intensity aerobic activity a week and strengthening exercises 2-3 times per week for cardiovascular health, weight loss maintenance and preservation of muscle mass.  She has agreed to  : Think about enjoyable ways to increase daily physical activity and overcoming barriers to exercise   Behavioral Modifications Evidence-based interventions for health behavior change were utilized today including the discussion of   1) self care:    - Continue seeing counselor   2) SMART goals for next OV:    - Incorporate exercise for mental well being  Regarding patient's less desirable eating habits and patterns, we employed the technique of small changes.   We discussed the following today: increasing lean protein intake to established goals, keeping healthy foods at home, work on managing stress, creating time for self-care and relaxation, and continue to practice mindfulness when eating Additional resources provided today: Handout on balanced plate concepts.  , Handout on CAT 1 meal plan , Handout on CAT 1-2 breakfast options, and Handout on CAT 1-2 lunch options   Medical Interventions/ Pharmacotherapy Previous Bariatric surgery: n/a Pharmacotherapy for weight loss: She is currently taking  Wellbutrin  XL 1 tablet daily for medical weight loss.    We discussed various medication options to help [name] with her weight loss efforts and we both agreed to : Adequate clinical response to anti-obesity medication, continue current regimen   B) OBESITY RELATED CONDITIONS ADDRESSED TODAY:  Prediabetes Assessment & Plan Lab Results  Component Value Date   HGBA1C 5.4 10/08/2024   HGBA1C 5.7 (H)  05/05/2024   HGBA1C 5.5 01/20/2024   INSULIN  4.8 05/05/2024   INSULIN  11.2 01/20/2024    Patient states that she has not been taking Metformin . She reports that she felt like it wasn't effective and states that after the recent events she has not restarted the medication. Since patient felt like it wasn't effective and she wasn't regularly taking her medication mutually agreed to Switch to Metformin  XR 1000 mg daily. Stressed the importance of taking her medication consistently. Continue  following prudent meal plan and decreasing simple carbs.     Grief at loss of child Other Specified Feeding or Eating Disorder, Emotional Eating Behaviors Assessment & Plan Xanax  1 tablet every so often and Wellbutrin  XL 1 tablet daily. Patient states that she is unsure if the Wellbutrin  is helping with her emotional eating. She endorses talking to Dr.Barker. She reports that she has been working on staying busy because when she is alone she feels more emotions. She is doing better with sleep, at the end of the day she is so exhausted that she has not been taking the Trazadone consistently. Continue meeting with Dr.Barker and working on strategies to take your mind off the situation. Take a walk or have something that helps distract you.        Vitamin D  deficiency Assessment & Plan  Lab Results  Component Value Date   VD25OH 63.9 10/08/2024   VD25OH 70.3 05/05/2024   VD25OH 33.4 01/20/2024   On ERGO 50K units every 14 days. Patient states she has been taking this consistenly. No acute concerns today. Continue supplementation, will refill today. Will obtain labs in 3-4 weeks,      Medications Discontinued During This Encounter  Medication Reason   metFORMIN  (GLUCOPHAGE ) 500 MG tablet    Vitamin D , Ergocalciferol , (DRISDOL ) 1.25 MG (50000 UNIT) CAPS capsule Reorder     Meds ordered this encounter  Medications   metFORMIN  (GLUCOPHAGE -XR) 500 MG 24 hr tablet    Sig: Take 2 tablets (1,000 mg total) by mouth daily with breakfast.    Dispense:  60 tablet    Refill:  1   Vitamin D , Ergocalciferol , (DRISDOL ) 1.25 MG (50000 UNIT) CAPS capsule    Sig: Take 1 capsule (50,000 Units total) by mouth every 14 (fourteen) days.    Dispense:  6 capsule    Refill:  0      Follow up:   Return 01/06/2025 at 10:00 AM  She was informed of the importance of frequent follow up visits to maximize her success with intensive lifestyle modifications for her multiple health conditions.   Weight  Summary and Biometrics   Weight Lost Since Last Visit: 0lb  Weight Gained Since Last Visit: 3lb   Vitals Temp: 97.8 F (36.6 C) BP: 134/85 Pulse Rate: 83 SpO2: 99 %   Anthropometric Measurements Height: 5' (1.524 m) Weight: 160 lb (72.6 kg) BMI (Calculated): 25.84 Weight at Last Visit: 157 lb Weight Lost Since Last Visit: 0lb Weight Gained Since Last Visit: 3lb Starting Weight: 189 lb Total Weight Loss (lbs): 29 lb (13.2 kg) Peak Weight: 250 lb   Body Composition  Body Fat %: 38 % Fat Mass (lbs): 61.2 lbs Muscle Mass (lbs): 94.6 lbs Total Body Water  (lbs): 68 lbs Visceral Fat Rating : 8   Other Clinical Data Fasting: No Labs: no Today's Visit #: 15 Starting Date: 01/20/24    Objective:   PHYSICAL EXAM: Blood pressure 134/85, pulse 83, temperature 97.8 F (36.6 C), height 5' (1.524 m), weight  160 lb (72.6 kg), last menstrual period 12/24/2006, SpO2 99%. Body mass index is 31.25 kg/m.  General: she is overweight, cooperative and in no acute distress. PSYCH: Has normal mood, affect and thought process.   HEENT: EOMI, sclerae are anicteric. Lungs: Normal breathing effort, no conversational dyspnea. Extremities: Moves * 4 Neurologic: A and O * 3, good insight  DIAGNOSTIC DATA REVIEWED: BMET    Component Value Date/Time   NA 141 10/08/2024 0934   K 4.2 10/08/2024 0934   CL 103 10/08/2024 0934   CO2 26 10/08/2024 0934   GLUCOSE 95 10/08/2024 0934   GLUCOSE 90 04/21/2024 2310   BUN 12 10/08/2024 0934   CREATININE 0.72 10/08/2024 0934   CALCIUM  9.4 10/08/2024 0934   GFRNONAA >60 04/21/2024 2310   GFRAA >60 11/15/2017 2142   Lab Results  Component Value Date   HGBA1C 5.4 10/08/2024   HGBA1C 5.6 10/17/2015   Lab Results  Component Value Date   INSULIN  4.8 05/05/2024   INSULIN  11.2 01/20/2024   Lab Results  Component Value Date   TSH 1.570 10/08/2024   CBC    Component Value Date/Time   WBC 6.7 04/21/2024 2310   RBC 4.50 04/21/2024 2310    HGB 12.9 04/21/2024 2310   HGB 14.3 01/20/2024 0953   HCT 38.7 04/21/2024 2310   HCT 42.3 01/20/2024 0953   PLT 210 04/21/2024 2310   PLT 253 01/20/2024 0953   MCV 86.0 04/21/2024 2310   MCV 87 01/20/2024 0953   MCH 28.7 04/21/2024 2310   MCHC 33.3 04/21/2024 2310   RDW 13.2 04/21/2024 2310   RDW 12.8 01/20/2024 0953   Iron Studies    Component Value Date/Time   IRON 89 11/14/2022 1321   TIBC 326.2 11/14/2022 1321   FERRITIN 87.8 11/14/2022 1321   IRONPCTSAT 27.3 11/14/2022 1321   Lipid Panel     Component Value Date/Time   CHOL 119 05/05/2024 0915   TRIG 53 05/05/2024 0915   HDL 48 05/05/2024 0915   CHOLHDL 2.5 05/05/2024 0915   CHOLHDL 3 09/04/2023 0837   VLDL 17.0 09/04/2023 0837   LDLCALC 59 05/05/2024 0915   LDLDIRECT 118.0 12/28/2019 1323   Hepatic Function Panel     Component Value Date/Time   PROT 7.0 10/08/2024 0934   ALBUMIN 4.4 10/08/2024 0934   AST 17 10/08/2024 0934   ALT 11 10/08/2024 0934   ALKPHOS 94 10/08/2024 0934   BILITOT 0.4 10/08/2024 0934   BILIDIR 0.1 09/04/2023 0837      Component Value Date/Time   TSH 1.570 10/08/2024 0934   Nutritional Lab Results  Component Value Date   VD25OH 63.9 10/08/2024   VD25OH 70.3 05/05/2024   VD25OH 33.4 01/20/2024    Attestations:   LILLETTE Sonny Laroche, acting as a stage manager for Brittany Jenkins, DO., have compiled all relevant documentation for today's office visit on behalf of Brittany Jenkins, DO, while in the presence of [employer], DO.  I have reviewed the above documentation for accuracy and completeness, and I agree with the above. Brittany JINNY [name], D.O.  The 21st Century Cures Act was signed into law in 2016 which includes the topic of electronic health records.  This provides immediate access to information in MyChart.  This includes consultation notes, operative notes, office notes, lab results and pathology reports.  If you have any questions about what you read please let us   know at your next visit so we can discuss your concerns and take corrective action  if need be.  We are right here with you.

## 2024-12-02 NOTE — Progress Notes (Signed)
 ERRONEOUS ENCOUNTER--DISREGARD

## 2024-12-07 ENCOUNTER — Encounter (HOSPITAL_COMMUNITY): Payer: Self-pay | Admitting: Obstetrics and Gynecology

## 2024-12-07 ENCOUNTER — Ambulatory Visit: Admitting: Obstetrics and Gynecology

## 2024-12-07 ENCOUNTER — Telehealth (INDEPENDENT_AMBULATORY_CARE_PROVIDER_SITE_OTHER): Admitting: Psychology

## 2024-12-07 ENCOUNTER — Encounter: Payer: Self-pay | Admitting: Obstetrics and Gynecology

## 2024-12-07 ENCOUNTER — Other Ambulatory Visit: Payer: Self-pay | Admitting: Obstetrics and Gynecology

## 2024-12-07 VITALS — BP 122/77 | HR 73 | Ht 66.0 in | Wt 165.0 lb

## 2024-12-07 DIAGNOSIS — F419 Anxiety disorder, unspecified: Secondary | ICD-10-CM

## 2024-12-07 DIAGNOSIS — Z634 Disappearance and death of family member: Secondary | ICD-10-CM | POA: Diagnosis not present

## 2024-12-07 DIAGNOSIS — Z01818 Encounter for other preprocedural examination: Secondary | ICD-10-CM

## 2024-12-07 DIAGNOSIS — F5089 Other specified eating disorder: Secondary | ICD-10-CM

## 2024-12-07 MED ORDER — POLYETHYLENE GLYCOL 3350 17 GM/SCOOP PO POWD: 17.0000 g | Freq: Every day | ORAL | 0 refills | Status: AC

## 2024-12-07 MED ORDER — OXYCODONE HCL 5 MG PO TABS: 5.0000 mg | ORAL_TABLET | ORAL | 0 refills | Status: AC | PRN

## 2024-12-07 MED ORDER — ONDANSETRON HCL 4 MG PO TABS: 4.0000 mg | ORAL_TABLET | Freq: Three times a day (TID) | ORAL | 0 refills | Status: AC | PRN

## 2024-12-07 MED ORDER — ACETAMINOPHEN 500 MG PO TABS: 500.0000 mg | ORAL_TABLET | Freq: Four times a day (QID) | ORAL | 0 refills | Status: AC | PRN

## 2024-12-07 MED ORDER — IBUPROFEN 600 MG PO TABS: 600.0000 mg | ORAL_TABLET | Freq: Four times a day (QID) | ORAL | 0 refills | Status: AC | PRN

## 2024-12-07 NOTE — H&P (Signed)
 [facility] H&P  Subjective Chief Complaint: [name] presents for a preoperative encounter.   History of Present Illness: [name] is a 58 y.o. female who presents for preoperative visit.  She is scheduled to undergo  Exam under anesthesia, revision robotic assisted sacrocolpopexy, possible anterior repair, possible paravaginal repair, possible sacrospinous fixation, possible placement of biologic graft, cystoscopy  on 12/21/24.  Her symptoms include pelvic organ prolapse, and she was was found to have Stage II anterior, Stage 0 posterior, Stage 0-I apical prolapse.   Urodynamics showed: Deferred  Past Medical History:  Diagnosis Date   ADHD    Allergy    Anxiety 2014   Back pain    CAD (coronary artery disease)    Constipated    Depression    Fatty liver    GERD (gastroesophageal reflux disease) 2000   History of abnormal cervical Pap smear    1991 -- 1995--hx cryotherapy to cervix   History of colon polyps    2008- BENIGN   Hydronephrosis, left    Hyperlipidemia 2024   Kidney stones 2016   OSA (obstructive sleep apnea)    PONV (postoperative nausea and vomiting)    RLS (restless legs syndrome)    Rosacea    Vitamin D  deficiency      Past Surgical History:  Procedure Laterality Date   ABDOMINAL HYSTERECTOMY  2008   ANTERIOR AND POSTERIOR REPAIR WITH SACROSPINOUS FIXATION N/A 08/16/2015   Procedure: ANTERIOR COLPORRHAPHY WITH XENOFORM GRAFT AND SACROSPINOUS FIXATION;  Surgeon: Bobie FORBES Cathlyn JAYSON Nikki, MD;  Location: [facility];  Service: Gynecology;  Laterality: N/A;  2.5 hours OR time   BLADDER SUSPENSION N/A 08/16/2015   Procedure: TRANSVAGINAL TAPE (TVT) PROCEDURE exact midurethral sling;  Surgeon: Bobie FORBES Cathlyn JAYSON Nikki, MD;  Location: [facility];  Service: Gynecology;  Laterality: N/A;   CHOLECYSTECTOMY  1999   COLONOSCOPY  08/21/2017   per Dr. Teressa, clear, repeat in 10 yrs   CYSTOCELE REPAIR N/A 12/12/2022   Procedure:  ANTERIOR REPAIR (CYSTOCELE);  Surgeon: Marilynne Rosaline SAILOR, MD;  Location: [facility];  Service: Gynecology;  Laterality: N/A;   CYSTOSCOPY N/A 08/16/2015   Procedure: CYSTOSCOPY;  Surgeon: Bobie FORBES Cathlyn JAYSON Nikki, MD;  Location: [facility];  Service: Gynecology;  Laterality: N/A;   CYSTOSCOPY N/A 12/12/2022   Procedure: CYSTOSCOPY;  Surgeon: Marilynne Rosaline SAILOR, MD;  Location[facility];  Service: Gynecology;  Laterality: N/A;   CYSTOSCOPY W/ RETROGRADES Bilateral 06/22/2015   Procedure: CYSTOSCOPY WITH RETROGRADE PYELOGRAM;  Surgeon: Belvie LITTIE Clara, MD;  Location: [facility];  Service: Urology;  Laterality: Bilateral;   CYSTOSCOPY W/ URETERAL STENT PLACEMENT  01/1999   CYSTOSCOPY WITH HOLMIUM LASER LITHOTRIPSY Left 05/18/2015   Procedure: CYSTOSCOPY WITH HOLMIUM LASER LITHOTRIPSY;  Surgeon: Ricardo Likens, MD;  Location: [facility];  Service: Urology;  Laterality: Left;   CYSTOSCOPY WITH RETROGRADE PYELOGRAM, URETEROSCOPY AND STENT PLACEMENT Left 06/22/2015   Procedure: CYSTOSCOPY,  LEFT URETEROSCOPY;  Surgeon: Belvie LITTIE Clara, MD;  Location: [facility];  Service: Urology;  Laterality: Left;   CYSTOSCOPY WITH URETEROSCOPY AND STENT PLACEMENT Left 05/18/2015   Procedure: CYSTOSCOPY WITH URETEROSCOPY, STONE MANIPULATION AND STENT PLACEMENT;  Surgeon: Ricardo Likens, MD;  Location: [facility];  Service: Urology;  Laterality: Left;   DIAGNOSTIC LAPAROSCOPY     DX LAPAROSCOPY  X2   ESOPHAGOGASTRODUODENOSCOPY  05/09/2007   EXPLORATORY LAPAROTOMY W/ BILATERAL SALPINGECTOMY AND REMOVAL RIGHT ECTOPIC  PREG.  02/23/2004   EXTRACORPOREAL SHOCK WAVE LITHOTRIPSY  2001  &  2002   LAPAROSCOPIC CHOLECYSTECTOMY  1999   POLYPECTOMY     ROBOTIC ASSISTED LAPAROSCOPIC SACROCOLPOPEXY N/A 12/12/2022   Procedure: XI ROBOTIC ASSISTED LAPAROSCOPIC SACROCOLPOPEXY;  Surgeon: Marilynne Rosaline SAILOR, MD;  Location:  [facility];  Service: Gynecology;  Laterality: N/A;  Total time requested 3 hours -Assist requested   SHOULDER ARTHROSCOPY WITH OPEN ROTATOR CUFF REPAIR Right 2012   TOTAL ABDOMINAL HYSTERECTOMY W/ BILATERAL OOPHORECTOMY AND LYSIS ADHESIONS  09/02/2007   TUBAL LIGATION Bilateral 1995   UMBILICAL HERNIA REPAIR  2003   unsure if she has mesh   VAGINAL HYSTERECTOMY N/A 08/18/2015   Procedure: Exam under Anesthesia, Excision Xenform Graft, Removal Bilateral Sacrospinous Ligament sutures;  Surgeon: Bobie FORBES Cathlyn JAYSON Nikki, MD;  Location: [facility];  Service: Gynecology;  Laterality: N/A;    is allergic to desvenlafaxine and prochlorperazine .   Family History  Problem Relation Age of Onset   Kidney disease Mother    Hypertension Mother    Colon polyps Mother    Heart disease Mother    Depression Mother    Obesity Mother    Arrhythmia Mother        AFIB   Diabetes Father    Colon polyps Father    Arrhythmia Father        had AFIB   Heart disease Father        Died 10/07/23   Drug abuse Sister    Early death Sister    Anxiety disorder Brother    Cancer Paternal Uncle        Pancreas   Pancreatic cancer Paternal Uncle    Heart attack Maternal Grandmother    Early death Maternal Grandmother    Breast cancer Paternal Grandmother    Cancer Paternal Uncle    Drug abuse Sister    Early death Brother    Esophageal cancer Neg Hx    Stomach cancer Neg Hx    Rectal cancer Neg Hx    Colon cancer Neg Hx     Social History[1]   Review of Systems was negative for a full 10 system review except as noted in the History of Present Illness.  Current Medications[2]   Objective There were no vitals filed for this visit.  Gen: NAD CV: S1 S2 RRR Lungs: Clear to auscultation bilaterally Abd: soft, nontender   Previous Pelvic Exam showed: POP-Q   0                                            Aa   0                                           Ba   -7.5                                               C    4  Gh   5                                            Pb   8                                            tvl    -3                                            Ap   -3                                            Bp                                                  Assessment/ Plan  Assessment: The patient is a 58 y.o. year old scheduled to undergo Exam under anesthesia, revision robotic assisted sacrocolpopexy, possible anterior repair, possible paravaginal repair, possible sacrospinous fixation, possible placement of biologic graft, cystoscopy. Verbal consent was obtained for these procedures.       [1]  Social History Tobacco Use   Smoking status: Former    Current packs/day: 0.00    Types: Cigarettes    Start date: 05/16/1993    Quit date: 05/16/2008    Years since quitting: 16.5   Smokeless tobacco: Never  Vaping Use   Vaping status: Never Used  Substance Use Topics   Alcohol use: No    Alcohol/week: 0.0 standard drinks of alcohol   Drug use: No  [2] No current facility-administered medications for this encounter.  Current Outpatient Medications:    acetaminophen  (TYLENOL ) 500 MG tablet, Take 1 tablet (500 mg total) by mouth every 6 (six) hours as needed (pain)., Disp: 30 tablet, Rfl: 0   ALPRAZolam  (XANAX ) 1 MG tablet, Take 1 tablet (1 mg total) by mouth 2 (two) times daily as needed for anxiety., Disp: 60 tablet, Rfl: 5   aspirin  EC 81 MG tablet, Take 1 tablet (81 mg total) by mouth daily. Swallow whole., Disp: 1 tablet, Rfl: 0   atorvastatin  (LIPITOR) 40 MG tablet, Take 40 mg by mouth daily., Disp: , Rfl:    buPROPion  (WELLBUTRIN  XL) 300 MG 24 hr tablet, Take 1 tablet (300 mg total) by mouth daily., Disp: 90 tablet, Rfl: 0   cyanocobalamin  (VITAMIN B12) 500 MCG tablet, 300 mcg- 500 mcg daily, Disp: , Rfl:    estradiol  (ESTRACE ) 0.1 MG/GM vaginal cream, Place 0.5 g vaginally 2 (two) times a  week. Place 0.5g nightly for two weeks then twice a week after, Disp: 42.5 g, Rfl: 11   ibuprofen  (ADVIL ) 600 MG tablet, Take 1 tablet (600 mg total) by mouth every 6 (six) hours as needed., Disp: 30 tablet, Rfl: 0   metFORMIN  (GLUCOPHAGE -XR) 500 MG 24 hr tablet, Take 2 tablets (1,000 mg total) by mouth daily with breakfast., Disp: 60 tablet,  Rfl: 1   nitroGLYCERIN  (NITROSTAT ) 0.4 MG SL tablet, Place 1 tablet (0.4 mg total) under the tongue every 5 (five) minutes as needed for chest pain. 3 doses max in a 24 hour period, Disp: 25 tablet, Rfl: 3   ondansetron  (ZOFRAN ) 4 MG tablet, Take 1 tablet (4 mg total) by mouth every 8 (eight) hours as needed for nausea or vomiting., Disp: 10 tablet, Rfl: 0   oxyCODONE  (OXY IR/ROXICODONE ) 5 MG immediate release tablet, Take 1 tablet (5 mg total) by mouth every 4 (four) hours as needed for severe pain (pain score 7-10)., Disp: 15 tablet, Rfl: 0   polyethylene glycol powder (GLYCOLAX /MIRALAX ) 17 GM/SCOOP powder, Take 17 g by mouth daily. Drink 17g (1 scoop) dissolved in water  per day., Disp: 255 g, Rfl: 0   traZODone  (DESYREL ) 100 MG tablet, Take 1 tablet (100 mg total) by mouth at bedtime., Disp: 90 tablet, Rfl: 0   Trospium  Chloride 60 MG CP24, Take 1 capsule (60 mg total) by mouth daily., Disp: 90 capsule, Rfl: 3   Vitamin D , Ergocalciferol , (DRISDOL ) 1.25 MG (50000 UNIT) CAPS capsule, Take 1 capsule (50,000 Units total) by mouth every 14 (fourteen) days., Disp: 6 capsule, Rfl: 0

## 2024-12-07 NOTE — Progress Notes (Signed)
 [facility] Pre-Operative Exam  Subjective Chief Complaint: [name] presents for a preoperative encounter.   History of Present Illness: [name] is a 58 y.o. female who presents for preoperative visit.  She is scheduled to undergo  Exam under anesthesia, revision robotic assisted sacrocolpopexy, possible anterior repair, possible paravaginal repair, possible sacrospinous fixation, possible placement of biologic graft, cystoscopy  on 12/21/24.  Her symptoms include pelvic organ prolapse, and she was was found to have Stage II anterior, Stage 0 posterior, Stage 0-I apical prolapse.   Urodynamics showed: Deferred  Past Medical History:  Diagnosis Date   ADHD    Allergy    Anxiety 2014   Back pain    CAD (coronary artery disease)    Constipated    Depression    Fatty liver    GERD (gastroesophageal reflux disease) 2000   History of abnormal cervical Pap smear    1991 -- 1995--hx cryotherapy to cervix   History of colon polyps    2008- BENIGN   Hydronephrosis, left    Hyperlipidemia 2024   Kidney stones 2016   OSA (obstructive sleep apnea)    PONV (postoperative nausea and vomiting)    RLS (restless legs syndrome)    Rosacea    Vitamin D  deficiency      Past Surgical History:  Procedure Laterality Date   ABDOMINAL HYSTERECTOMY  2008   ANTERIOR AND POSTERIOR REPAIR WITH SACROSPINOUS FIXATION N/A 08/16/2015   Procedure: ANTERIOR COLPORRHAPHY WITH XENOFORM GRAFT AND SACROSPINOUS FIXATION;  Surgeon: Bobie FORBES Cathlyn JAYSON Nikki, MD;  Location: [facility];  Service: Gynecology;  Laterality: N/A;  2.5 hours OR time   BLADDER SUSPENSION N/A 08/16/2015   Procedure: TRANSVAGINAL TAPE (TVT) PROCEDURE exact midurethral sling;  Surgeon: Bobie FORBES Cathlyn JAYSON Nikki, MD;  Location: [facility];  Service: Gynecology;  Laterality: N/A;   CHOLECYSTECTOMY  1999   COLONOSCOPY  08/21/2017   per Dr. Teressa, clear, repeat in 10 yrs   CYSTOCELE REPAIR N/A 12/12/2022    Procedure: ANTERIOR REPAIR (CYSTOCELE);  Surgeon: Marilynne Rosaline SAILOR, MD;  Location: [facility];  Service: Gynecology;  Laterality: N/A;   CYSTOSCOPY N/A 08/16/2015   Procedure: CYSTOSCOPY;  Surgeon: Bobie FORBES Cathlyn JAYSON Nikki, MD;  Location: [facility];  Service: Gynecology;  Laterality: N/A;   CYSTOSCOPY N/A 12/12/2022   Procedure: CYSTOSCOPY;  Surgeon: Marilynne Rosaline SAILOR, MD;  Location: [facility];  Service: Gynecology;  Laterality: N/A;   CYSTOSCOPY W/ RETROGRADES Bilateral 06/22/2015   Procedure: CYSTOSCOPY WITH RETROGRADE PYELOGRAM;  Surgeon: Belvie LITTIE Clara, MD;  Location: [facility];  Service: Urology;  Laterality: Bilateral;   CYSTOSCOPY W/ URETERAL STENT PLACEMENT  01/1999   CYSTOSCOPY WITH HOLMIUM LASER LITHOTRIPSY Left 05/18/2015   Procedure: CYSTOSCOPY WITH HOLMIUM LASER LITHOTRIPSY;  Surgeon: Ricardo Likens, MD;  Location: [facility];  Service: Urology;  Laterality: Left;   CYSTOSCOPY WITH RETROGRADE PYELOGRAM, URETEROSCOPY AND STENT PLACEMENT Left 06/22/2015   Procedure: CYSTOSCOPY,  LEFT URETEROSCOPY;  Surgeon: Belvie LITTIE Clara, MD;  Location: [facility];  Service: Urology;  Laterality: Left;   CYSTOSCOPY WITH URETEROSCOPY AND STENT PLACEMENT Left 05/18/2015   Procedure: CYSTOSCOPY WITH URETEROSCOPY, STONE MANIPULATION AND STENT PLACEMENT;  Surgeon: Ricardo Likens, MD;  Location: [facility];  Service: Urology;  Laterality: Left;   DIAGNOSTIC LAPAROSCOPY     DX LAPAROSCOPY  X2   ESOPHAGOGASTRODUODENOSCOPY  05/09/2007   EXPLORATORY LAPAROTOMY W/ BILATERAL SALPINGECTOMY AND REMOVAL RIGHT  ECTOPIC PREG.  02/23/2004   EXTRACORPOREAL SHOCK WAVE LITHOTRIPSY  2001  &  2002   LAPAROSCOPIC CHOLECYSTECTOMY  1999   POLYPECTOMY     ROBOTIC ASSISTED LAPAROSCOPIC SACROCOLPOPEXY N/A 12/12/2022   Procedure: XI ROBOTIC ASSISTED LAPAROSCOPIC SACROCOLPOPEXY;  Surgeon: Marilynne Rosaline SAILOR, MD;   Location: [facility];  Service: Gynecology;  Laterality: N/A;  Total time requested 3 hours -Assist requested   SHOULDER ARTHROSCOPY WITH OPEN ROTATOR CUFF REPAIR Right 2012   TOTAL ABDOMINAL HYSTERECTOMY W/ BILATERAL OOPHORECTOMY AND LYSIS ADHESIONS  09/02/2007   TUBAL LIGATION Bilateral 1995   UMBILICAL HERNIA REPAIR  2003   unsure if she has mesh   VAGINAL HYSTERECTOMY N/A 08/18/2015   Procedure: Exam under Anesthesia, Excision Xenform Graft, Removal Bilateral Sacrospinous Ligament sutures;  Surgeon: Bobie FORBES Cathlyn JAYSON Nikki, MD;  Location: [facility];  Service: Gynecology;  Laterality: N/A;    is allergic to desvenlafaxine and prochlorperazine .   Family History  Problem Relation Age of Onset   Kidney disease Mother    Hypertension Mother    Colon polyps Mother    Heart disease Mother    Depression Mother    Obesity Mother    Arrhythmia Mother        AFIB   Diabetes Father    Colon polyps Father    Arrhythmia Father        had AFIB   Heart disease Father        Died 18-Sep-2023   Drug abuse Sister    Early death Sister    Anxiety disorder Brother    Cancer Paternal Uncle        Pancreas   Pancreatic cancer Paternal Uncle    Heart attack Maternal Grandmother    Early death Maternal Grandmother    Breast cancer Paternal Grandmother    Cancer Paternal Uncle    Drug abuse Sister    Early death Brother    Esophageal cancer Neg Hx    Stomach cancer Neg Hx    Rectal cancer Neg Hx    Colon cancer Neg Hx     Social History[1]   Review of Systems was negative for a full 10 system review except as noted in the History of Present Illness.  Current Medications[2]   Objective There were no vitals filed for this visit.  Gen: NAD CV: S1 S2 RRR Lungs: Clear to auscultation bilaterally Abd: soft, nontender   Previous Pelvic Exam showed: POP-Q   0                                            Aa   0                                           Ba   -7.5                                               C    4  Gh   5                                            Pb   8                                            tvl    -3                                            Ap   -3                                            Bp                                                  Assessment/ Plan  Assessment: The patient is a 58 y.o. year old scheduled to undergo Exam under anesthesia, revision robotic assisted sacrocolpopexy, possible anterior repair, possible paravaginal repair, possible sacrospinous fixation, possible placement of biologic graft, cystoscopy. Verbal consent was obtained for these procedures.  Plan: General Surgical Consent: The patient has previously been counseled on alternative treatments, and the decision by the patient and provider was to proceed with the procedure listed above.  For all procedures, there are risks of bleeding, infection, damage to surrounding organs including but not limited to bowel, bladder, blood vessels, ureters and nerves, and need for further surgery if an injury were to occur. These risks are all low with minimally invasive surgery.   There are risks of numbness and weakness at any body site or buttock/rectal pain.  It is possible that baseline pain can be worsened by surgery, either with or without mesh. If surgery is vaginal, there is also a low risk of possible conversion to laparoscopy or open abdominal incision where indicated. Very rare risks include blood transfusion, blood clot, heart attack, pneumonia, or death.   There is also a risk of short-term postoperative urinary retention with need to use a catheter. About half of patients need to go home from surgery with a catheter, which is then later removed in the office. The risk of long-term need for a catheter is very low. There is also a risk of worsening of overactive bladder.   Prolapse (with or without  mesh): Risk factors for surgical failure  include things that put pressure on your pelvis and the surgical repair, including obesity, chronic cough, and heavy lifting or straining (including lifting children or adults, straining on the toilet, or lifting heavy objects such as furniture or anything weighing >25 lbs. Risks of recurrence is 20-30% with vaginal native tissue repair and a less than 10% with sacrocolpopexy with mesh.    Sacrocolpopexy: Mesh implants may provide more prolapse support, but do have some unique risks to consider. It is important to understand that mesh is permanent and cannot be easily removed. Risks of abdominal sacrocolpopexy mesh include  mesh exposure (~3-6%), painful intercourse (recent studies show lower rates after surgery compared to before, with ~5-8% risk of new onset), and very rare risks of bowel or bladder injury or infection (<1%). The risk of mesh exposure is more likely in a woman with risks for poor healing (prior radiation, poorly controlled diabetes, or immunocompromised). The risk of new or worsened chronic pain after mesh implant is more common in women with baseline chronic pain and/or poorly controlled anxiety or depression. There is an FDA safety notification on vaginal mesh procedures for prolapse but NOT abdominal mesh procedures and therefore does not apply to your surgery. We have extensive experience and training with mesh placement and we have close postoperative follow up to identify any potential complications from mesh.    We discussed consent for blood products. Risks for blood transfusion include allergic reactions, other reactions that can affect different body organs and managed accordingly, transmission of infectious diseases such as HIV or Hepatitis. However, the blood is screened. Patient consents for blood products.  Pre-operative instructions:  She was instructed to not take Aspirin /NSAIDs x 7days prior to surgery. She will hold her 81mg  ASA.  Antibiotic prophylaxis was ordered as indicated.  Catheter use: Patient will go home with foley if needed after post-operative voiding trial.  Post-operative instructions:  She was provided with specific post-operative instructions, including precautions and signs/symptoms for which we would recommend contacting us , in addition to daytime and after-hours contact phone numbers. This was provided on a handout.   Post-operative medications: Prescriptions for motrin , tylenol , miralax , zofran ,  and oxycodone  were sent to her pharmacy. Discussed using ibuprofen  and tylenol  on a schedule to limit use of narcotics.   Laboratory testing:  We will check labs: As requested by anesthesia   Preoperative clearance:  She does not require surgical clearance.    Post-operative follow-up:  A post-operative appointment will be made for 6 weeks from the date of surgery. If she needs a post-operative nurse visit for a voiding trial, that will be set up after she leaves the hospital.    Patient will call the clinic or use MyChart should anything change or any new issues arise.   Dontai Pember G Christoffer Currier, NP      [1]  Social History Tobacco Use   Smoking status: Former    Current packs/day: 0.00    Types: Cigarettes    Start date: 05/16/1993    Quit date: 05/16/2008    Years since quitting: 16.5   Smokeless tobacco: Never  Vaping Use   Vaping status: Never Used  Substance Use Topics   Alcohol use: No    Alcohol/week: 0.0 standard drinks of alcohol   Drug use: No  [2]  Current Outpatient Medications:    ALPRAZolam  (XANAX ) 1 MG tablet, Take 1 tablet (1 mg total) by mouth 2 (two) times daily as needed for anxiety., Disp: 60 tablet, Rfl: 5   aspirin  EC 81 MG tablet, Take 1 tablet (81 mg total) by mouth daily. Swallow whole., Disp: 1 tablet, Rfl: 0   atorvastatin  (LIPITOR) 40 MG tablet, Take 40 mg by mouth daily., Disp: , Rfl:    buPROPion  (WELLBUTRIN  XL) 300 MG 24 hr tablet, Take 1 tablet (300 mg total) by  mouth daily., Disp: 90 tablet, Rfl: 0   cyanocobalamin  (VITAMIN B12) 500 MCG tablet, 300 mcg- 500 mcg daily, Disp: , Rfl:    estradiol  (ESTRACE ) 0.1 MG/GM vaginal cream, Place 0.5 g vaginally 2 (two) times a week. Place 0.5g nightly for two  weeks then twice a week after, Disp: 42.5 g, Rfl: 11   metFORMIN  (GLUCOPHAGE -XR) 500 MG 24 hr tablet, Take 2 tablets (1,000 mg total) by mouth daily with breakfast., Disp: 60 tablet, Rfl: 1   nitroGLYCERIN  (NITROSTAT ) 0.4 MG SL tablet, Place 1 tablet (0.4 mg total) under the tongue every 5 (five) minutes as needed for chest pain. 3 doses max in a 24 hour period, Disp: 25 tablet, Rfl: 3   traZODone  (DESYREL ) 100 MG tablet, Take 1 tablet (100 mg total) by mouth at bedtime., Disp: 90 tablet, Rfl: 0   Trospium  Chloride 60 MG CP24, Take 1 capsule (60 mg total) by mouth daily., Disp: 90 capsule, Rfl: 3   Vitamin D , Ergocalciferol , (DRISDOL ) 1.25 MG (50000 UNIT) CAPS capsule, Take 1 capsule (50,000 Units total) by mouth every 14 (fourteen) days., Disp: 6 capsule, Rfl: 0

## 2024-12-07 NOTE — Progress Notes (Signed)
°  Office: [phone]  /  Fax: [phone]    Date: December 07, 2024  Appointment Start Time: 12:32pm Duration: 35 minutes Provider: Wyatt Fire, Psy.D. Type of Session: Individual Therapy  Location of Patient: Home (private location) Location of Provider: [facility] clinic at [facility] Type of Contact: Telepsychological Visit via MyChart Video Visit  Session Content: [name] is a 58 y.o. female presenting for a follow-up appointment to address the previously established treatment goal of increasing coping skills. Today's appointment was a telepsychological visit. [name] provided verbal consent for today's telepsychological appointment and she is aware she is responsible for securing confidentiality on her end of the session. Prior to proceeding with today's appointment, [name]'s physical location at the time of this appointment was obtained as well a phone number she could be reached at in the event of technical difficulties. [name] and this provider participated in today's telepsychological service.   This provider conducted a brief check-in. [name] shared she continues to journal and make notes throughout the day as needed. Associated thoughts and feelings were processed. She further shared about recent interactions with her daughter in law, noting she will be celebrating Christmas with her daughter in law and grandchildren. [name] further described going through the motions since her son's passing. Associated thoughts and feelings were processed. Briefly discussed focusing on her self-care and well-being by making better food choices and engaging in portion control. Overall, [name] was receptive to today's appointment as evidenced by openness to sharing, responsiveness to feedback, and willingness to continue engaging in learned skills.  Mental Status Examination:  Appearance: neat Behavior: appropriate to circumstances Mood: sad Affect: mood congruent and tearful Speech: WNL Eye Contact:  appropriate Psychomotor Activity: WNL Gait: unable to assess Thought Process: linear, logical, and goal directed and no evidence or endorsement of suicidal, homicidal, and self-harm ideation, plan and intent  Thought Content/Perception: no hallucinations, delusions, bizarre thinking or behavior endorsed or observed Orientation: AAOx4 Memory/Concentration: intact Insight: fair Judgment: fair  Interventions:  Conducted a brief chart review Provided empathic reflections and validation Reviewed content from the previous session Provided positive reinforcement Employed supportive psychotherapy interventions to facilitate reduced distress and to improve coping skills with identified stressors  DSM-5 Diagnosis(es): F50.89 Other Specified Feeding or Eating Disorder, Emotional Eating Behaviors, F41.9 Unspecified Anxiety Disorder and Z63.4 Uncomplicated Bereavement   Treatment Goal & Progress: During the initial appointment with this provider, the following treatment goal was established: increase coping skills. [name] has demonstrated progress in her goal as evidenced by increased awareness of hunger patterns, increased awareness of triggers for emotional eating behaviors, and reduction in emotional eating behaviors. [name] also continues to demonstrate willingness to engage in learned skills (e.g., urge surfing, mindful eating strategies).    Plan: As previously planned, today was [name]'s last appointment with this provider. [name] noted a plan to re-establish care with this provider at [facility] in the new year due to this provider's departure from [facility]. Until care is re-established, she noted a plan to continue with her current primary therapist. No further follow-up planned by this provider.    Wyatt Fire, PsyD

## 2024-12-08 ENCOUNTER — Encounter (HOSPITAL_COMMUNITY): Payer: Self-pay | Admitting: Obstetrics and Gynecology

## 2024-12-08 NOTE — Progress Notes (Signed)
 Spoke w/ via phone for pre-op interview--- [name] needs dos----  NONE       [name] results------ COVID test -----patient states asymptomatic no test needed Arrive at -------0530 NPO after MN NO Solid Food.   Pre-Surgery Ensure or G2:  Med rec completed Medications to take morning of surgery -----Wellbutrin  and Xanax -PRN Diabetic medication -----  GLP1 agonist last dose: GLP1 instructions:  Patient instructed no nail polish to be worn day of surgery Patient instructed to bring photo id and insurance card day of surgery Patient aware to have Driver (ride ) / caregiver    for 24 hours after surgery - Husband [name] Patient Special Instructions ----- per surgeon hold ASA 7 days prior to surgery. Pre-Op special Instructions -----  Patient verbalized understanding of instructions that were given at this phone interview. Patient denies chest pain, sob, fever, cough at the interview.

## 2024-12-09 ENCOUNTER — Other Ambulatory Visit (INDEPENDENT_AMBULATORY_CARE_PROVIDER_SITE_OTHER): Payer: Self-pay | Admitting: Family Medicine

## 2024-12-14 ENCOUNTER — Ambulatory Visit (INDEPENDENT_AMBULATORY_CARE_PROVIDER_SITE_OTHER): Admitting: Psychology

## 2024-12-14 DIAGNOSIS — F4321 Adjustment disorder with depressed mood: Secondary | ICD-10-CM

## 2024-12-14 DIAGNOSIS — F411 Generalized anxiety disorder: Secondary | ICD-10-CM

## 2024-12-14 NOTE — Progress Notes (Signed)
 "     [facility] Counselor/Therapist Progress Note  Patient ID: [name], MRN: [id number],    Date: 12/14/2024   Time Spent: 8:00am-8:51am  Treatment Type: Individual Therapy  Pt is seen for a virtual video visit via caregility. Pt consents to virtual visit and is aware of limitations of virtual visits.  Pt joins from her home, reporting privacy, and counselor from her home office.     Reported Symptoms: pt reports sadness, grief, worry and some emotional numbness.  Mental Status Exam: Appearance:  Well Groomed     Behavior: Appropriate  Motor: Normal  Speech/Language:  Normal Rate  Affect: Appropriate  Mood: anxious and sad  Thought process: normal  Thought content:   WNL  Sensory/Perceptual disturbances:   WNL  Orientation: oriented to person, place, time/date, and situation  Attention: Good  Concentration: Good  Memory: WNL  Fund of knowledge:  Good  Insight:   Good  Judgment:  Good  Impulse Control: Good   Risk Assessment: Danger to Self:  No Self-injurious Behavior: No Danger to Others: No Duty to Warn:no Physical Aggression / Violence:No  Access to Firearms a concern: No  Gang Involvement:No   Subjective: Counselor assessed pt current functioning per pt report.  Processed w/pt emotions and numbing.  Provided psychoeducation re: grief and discussed ways feels connected w/ son.  Explored interactions w/ family and grandkids and identified ways of recognizing emotions.   Pt affect wnl.  Pt reports on feeling of sadness and worry w/ emotional numbing.  Pt discussed how at times like to think if son as just away and the heaviness of reality feels too much.  Pt discussed interactions w/ the grandkids and positives of this.  Pt reports on difficulty w/ the holidays.  Pt discussed dreams w/ son and some comfort that has had with. Pt report on plans for holidays and w/ family.      Interventions: Cognitive Behavioral Therapy and supportive and self  compassion  Diagnosis:Generalized anxiety disorder  Grief  Plan: Pt to f/u w/ Counseling in 2 weeks.  Pt to f/u w/ PCP, Cardiologist and [facility] as scheduled.   Individualized Treatment Plan Strengths: enjoys walking and movement, enjoys the beach, family is important to her  Supports: her husband, her daughter in law and her mom    Goal/Needs for Treatment:  In order of importance to patient 1) cope with anxiety 2) increase self worth and decrease negative thought patterns.  3) continue cope through losses       Client Statement of Needs:   talk about my grief, continue to work through my anxiety, try not be so negative on stuff and thinking things are going to go bad or wrong.  Finding time for self again.        Treatment Level: outpatient counseling  Symptoms:anxiety, worry, ruminating thoughts, low self worth, emotional escalations, depressed mood, sleep disturbance  Client Treatment Preferences: counseling every 2-4 weeks and continue medication management w/ PCP    Healthcare consumer's goal for treatment:   Counselor, Damien Herald, [facility] will support the patient's ability to achieve the goals identified. Cognitive Behavioral Therapy, Assertive Communication/Conflict Resolution Training, Relaxation Training, ACT, Humanistic and other evidenced-based practices will be used to promote progress towards healthy functioning.    Healthcare consumer will: Actively participate in therapy, working towards healthy functioning.     *Justification for Continuation/Discontinuation of Goal: R=Revised, O=Ongoing, A=Achieved, D=Discontinued   Goal 1) Pt will increase coping with life's  anxiety and stressors AEB decreased ruminating worry, decreased emotional escalations, decreased negative thought patterns. That contribute to anxiety. Baseline date 03/10/24: Progress towards goal 40%; How Often - Daily Target Date Goal Was reviewed Status Code Progress towards goal   03/10/25                            Goal 2)  Increase self awareness, consistency w/ self care time and self compassion statements AEB pt report and therapist observation.   Baseline date 03/10/24: Progress towards goal 30; How Often - Daily Target Date Goal Was reviewed Status Code Progress towards goal/Likert rating  03/10/25                            Goal 3) Verbalize feelings of grief related to losses and recognize ways of living with grief present.  Baseline date 03/10/24: Progress towards goal 30; How Often - Daily Target Date Goal Was reviewed Status Code Progress towards goal/Likert rating  03/10/25                            This plan has been reviewed and created by the following participants:  This plan will be reviewed at least every 12 months. Date Behavioral Health Clinician Date Guardian/Patient   03/10/24 [facility] Barbarann [facility]                      03/10/24 Verbal consent provided and request for electronic signature sent and obtained            [name] [facility] "

## 2024-12-21 ENCOUNTER — Telehealth: Payer: Self-pay | Admitting: Obstetrics and Gynecology

## 2024-12-21 ENCOUNTER — Ambulatory Visit (HOSPITAL_COMMUNITY)
Admission: RE | Admit: 2024-12-21 | Discharge: 2024-12-21 | Disposition: A | Discharge status: Home / Self Care | Attending: Obstetrics and Gynecology | Admitting: Obstetrics and Gynecology

## 2024-12-21 ENCOUNTER — Other Ambulatory Visit: Payer: Self-pay

## 2024-12-21 ENCOUNTER — Ambulatory Visit (HOSPITAL_COMMUNITY): Admitting: Anesthesiology

## 2024-12-21 ENCOUNTER — Encounter (HOSPITAL_COMMUNITY): Payer: Self-pay | Admitting: Obstetrics and Gynecology

## 2024-12-21 ENCOUNTER — Encounter (HOSPITAL_COMMUNITY)
Admission: RE | Disposition: A | Discharge status: Home / Self Care | Payer: Self-pay | Source: Home / Self Care | Attending: Obstetrics and Gynecology

## 2024-12-21 DIAGNOSIS — N993 Prolapse of vaginal vault after hysterectomy: Secondary | ICD-10-CM | POA: Insufficient documentation

## 2024-12-21 DIAGNOSIS — F419 Anxiety disorder, unspecified: Secondary | ICD-10-CM | POA: Diagnosis not present

## 2024-12-21 DIAGNOSIS — F32A Depression, unspecified: Secondary | ICD-10-CM | POA: Insufficient documentation

## 2024-12-21 DIAGNOSIS — N8110 Cystocele, unspecified: Secondary | ICD-10-CM

## 2024-12-21 DIAGNOSIS — Z87891 Personal history of nicotine dependence: Secondary | ICD-10-CM | POA: Diagnosis not present

## 2024-12-21 DIAGNOSIS — G4733 Obstructive sleep apnea (adult) (pediatric): Secondary | ICD-10-CM | POA: Insufficient documentation

## 2024-12-21 DIAGNOSIS — Z01818 Encounter for other preprocedural examination: Secondary | ICD-10-CM

## 2024-12-21 HISTORY — DX: Prediabetes: R73.03

## 2024-12-21 HISTORY — PX: CYSTOSCOPY: SHX5120

## 2024-12-21 HISTORY — PX: ROBOTIC ASSISTED LAPAROSCOPIC SACROCOLPOPEXY: SHX5388

## 2024-12-21 SURGERY — SACROCOLPOPEXY, ROBOT-ASSISTED, LAPAROSCOPIC
Anesthesia: General | Site: Pelvis

## 2024-12-21 MED ORDER — ONDANSETRON HCL 4 MG/2ML IJ SOLN
INTRAMUSCULAR | Status: DC | PRN
  Administered 2024-12-21 (×2): 4 mg via INTRAVENOUS

## 2024-12-21 MED ORDER — PROPOFOL 10 MG/ML IV BOLUS
INTRAVENOUS | Status: DC | PRN
  Administered 2024-12-21: 150 mg via INTRAVENOUS
  Administered 2024-12-21: 50 ug/kg/min via INTRAVENOUS

## 2024-12-21 MED ORDER — ORAL CARE MOUTH RINSE: 15.0000 mL | Freq: Once | OROMUCOSAL | Status: AC

## 2024-12-21 MED ORDER — SCOPOLAMINE 1 MG/3DAYS TD PT72
MEDICATED_PATCH | TRANSDERMAL | Status: DC | PRN
  Administered 2024-12-21: 1 via TRANSDERMAL

## 2024-12-21 MED ORDER — AMISULPRIDE (ANTIEMETIC) 5 MG/2ML IV SOLN
10.0000 mg | Freq: Once | INTRAVENOUS | Status: AC
  Administered 2024-12-21: 10 mg via INTRAVENOUS

## 2024-12-21 MED ORDER — CHLORHEXIDINE GLUCONATE 0.12 % MT SOLN
OROMUCOSAL | Status: AC
  Filled 2024-12-21: qty 15

## 2024-12-21 MED ORDER — FENTANYL CITRATE (PF) 100 MCG/2ML IJ SOLN
25.0000 ug | INTRAMUSCULAR | Status: DC | PRN
  Administered 2024-12-21 (×2): 50 ug via INTRAVENOUS

## 2024-12-21 MED ORDER — ROCURONIUM BROMIDE 10 MG/ML (PF) SYRINGE
PREFILLED_SYRINGE | INTRAVENOUS | Status: DC | PRN
  Administered 2024-12-21 (×2): 50 mg via INTRAVENOUS

## 2024-12-21 MED ORDER — SUGAMMADEX SODIUM 200 MG/2ML IV SOLN
INTRAVENOUS | Status: DC | PRN
  Administered 2024-12-21: 400 mg via INTRAVENOUS

## 2024-12-21 MED ORDER — KETAMINE HCL 10 MG/ML IJ SOLN
INTRAMUSCULAR | Status: DC | PRN
  Administered 2024-12-21 (×2): 25 mg via INTRAVENOUS

## 2024-12-21 MED ORDER — KETOROLAC TROMETHAMINE 15 MG/ML IJ SOLN
INTRAMUSCULAR | Status: DC | PRN
  Administered 2024-12-21: 15 mg via INTRAVENOUS

## 2024-12-21 MED ORDER — PHENYLEPHRINE 80 MCG/ML (10ML) SYRINGE FOR IV PUSH (FOR BLOOD PRESSURE SUPPORT)
PREFILLED_SYRINGE | INTRAVENOUS | Status: AC
  Filled 2024-12-21: qty 10

## 2024-12-21 MED ORDER — PROPOFOL 1000 MG/100ML IV EMUL
INTRAVENOUS | Status: AC
  Filled 2024-12-21: qty 100

## 2024-12-21 MED ORDER — BUPIVACAINE HCL (PF) 0.25 % IJ SOLN
INTRAMUSCULAR | Status: DC | PRN
  Administered 2024-12-21: 15 mL

## 2024-12-21 MED ORDER — ACETAMINOPHEN 500 MG PO TABS
ORAL_TABLET | ORAL | Status: AC
  Filled 2024-12-21: qty 2

## 2024-12-21 MED ORDER — LIDOCAINE-EPINEPHRINE 1 %-1:100000 IJ SOLN
INTRAMUSCULAR | Status: AC
  Filled 2024-12-21: qty 3

## 2024-12-21 MED ORDER — ONDANSETRON HCL 4 MG/2ML IJ SOLN
INTRAMUSCULAR | Status: AC
  Filled 2024-12-21: qty 2

## 2024-12-21 MED ORDER — CEFAZOLIN SODIUM-DEXTROSE 2-4 GM/100ML-% IV SOLN
INTRAVENOUS | Status: AC
  Filled 2024-12-21: qty 100

## 2024-12-21 MED ORDER — SODIUM CHLORIDE (PF) 0.9 % IJ SOLN
INTRAMUSCULAR | Status: AC
  Filled 2024-12-21: qty 100

## 2024-12-21 MED ORDER — GABAPENTIN 300 MG PO CAPS
300.0000 mg | ORAL_CAPSULE | ORAL | Status: AC
  Administered 2024-12-21: 300 mg via ORAL

## 2024-12-21 MED ORDER — CEFAZOLIN SODIUM-DEXTROSE 2-4 GM/100ML-% IV SOLN
2.0000 g | INTRAVENOUS | Status: AC
  Administered 2024-12-21: 2 g via INTRAVENOUS

## 2024-12-21 MED ORDER — PHENYLEPHRINE HCL-NACL 20-0.9 MG/250ML-% IV SOLN
INTRAVENOUS | Status: AC
  Filled 2024-12-21: qty 250

## 2024-12-21 MED ORDER — ACETAMINOPHEN 500 MG PO TABS
1000.0000 mg | ORAL_TABLET | ORAL | Status: AC
  Administered 2024-12-21: 1000 mg via ORAL

## 2024-12-21 MED ORDER — EPHEDRINE SULFATE-NACL 50-0.9 MG/10ML-% IV SOSY
PREFILLED_SYRINGE | INTRAVENOUS | Status: DC | PRN
  Administered 2024-12-21: 2.5 mg via INTRAVENOUS
  Administered 2024-12-21: 7.5 mg via INTRAVENOUS

## 2024-12-21 MED ORDER — FENTANYL CITRATE (PF) 250 MCG/5ML IJ SOLN
INTRAMUSCULAR | Status: DC | PRN
  Administered 2024-12-21 (×3): 50 ug via INTRAVENOUS
  Administered 2024-12-21: 100 ug via INTRAVENOUS

## 2024-12-21 MED ORDER — SUGAMMADEX SODIUM 200 MG/2ML IV SOLN
INTRAVENOUS | Status: AC
  Filled 2024-12-21: qty 2

## 2024-12-21 MED ORDER — DEXAMETHASONE SOD PHOSPHATE PF 10 MG/ML IJ SOLN
INTRAMUSCULAR | Status: DC | PRN
  Administered 2024-12-21: 10 mg via INTRAVENOUS

## 2024-12-21 MED ORDER — PHENAZOPYRIDINE HCL 100 MG PO TABS
ORAL_TABLET | ORAL | Status: AC
  Filled 2024-12-21: qty 2

## 2024-12-21 MED ORDER — FENTANYL CITRATE (PF) 250 MCG/5ML IJ SOLN
INTRAMUSCULAR | Status: AC
  Filled 2024-12-21: qty 5

## 2024-12-21 MED ORDER — AMISULPRIDE (ANTIEMETIC) 5 MG/2ML IV SOLN
INTRAVENOUS | Status: AC
  Filled 2024-12-21: qty 4

## 2024-12-21 MED ORDER — LACTATED RINGERS IV SOLN: INTRAVENOUS | Status: DC

## 2024-12-21 MED ORDER — KETOROLAC TROMETHAMINE 30 MG/ML IJ SOLN
INTRAMUSCULAR | Status: AC
  Filled 2024-12-21: qty 1

## 2024-12-21 MED ORDER — GABAPENTIN 300 MG PO CAPS
ORAL_CAPSULE | ORAL | Status: AC
  Filled 2024-12-21: qty 1

## 2024-12-21 MED ORDER — ALBUMIN HUMAN 5 % IV SOLN: INTRAVENOUS | Status: DC | PRN

## 2024-12-21 MED ORDER — SODIUM CHLORIDE 0.9 % IR SOLN
Status: DC | PRN
  Administered 2024-12-21: 1000 mL

## 2024-12-21 MED ORDER — OXYCODONE HCL 5 MG/5ML PO SOLN: 5.0000 mg | Freq: Once | ORAL | Status: DC | PRN

## 2024-12-21 MED ORDER — CHLORHEXIDINE GLUCONATE 0.12 % MT SOLN
15.0000 mL | Freq: Once | OROMUCOSAL | Status: AC
  Administered 2024-12-21: 15 mL via OROMUCOSAL

## 2024-12-21 MED ORDER — PROPOFOL 10 MG/ML IV BOLUS
INTRAVENOUS | Status: AC
  Filled 2024-12-21: qty 20

## 2024-12-21 MED ORDER — MIDAZOLAM HCL (PF) 2 MG/2ML IJ SOLN
INTRAMUSCULAR | Status: DC | PRN
  Administered 2024-12-21: 2 mg via INTRAVENOUS

## 2024-12-21 MED ORDER — FENTANYL CITRATE (PF) 100 MCG/2ML IJ SOLN
INTRAMUSCULAR | Status: AC
  Filled 2024-12-21: qty 2

## 2024-12-21 MED ORDER — 0.9 % SODIUM CHLORIDE (POUR BTL) OPTIME
TOPICAL | Status: DC | PRN
  Administered 2024-12-21: 1000 mL

## 2024-12-21 MED ORDER — SCOPOLAMINE 1 MG/3DAYS TD PT72
MEDICATED_PATCH | TRANSDERMAL | Status: AC
  Filled 2024-12-21: qty 1

## 2024-12-21 MED ORDER — ONDANSETRON HCL 4 MG/2ML IJ SOLN: 4.0000 mg | Freq: Four times a day (QID) | INTRAMUSCULAR | Status: DC | PRN

## 2024-12-21 MED ORDER — MIDAZOLAM HCL 2 MG/2ML IJ SOLN
INTRAMUSCULAR | Status: AC
  Filled 2024-12-21: qty 2

## 2024-12-21 MED ORDER — LIDOCAINE 2% (20 MG/ML) 5 ML SYRINGE
INTRAMUSCULAR | Status: DC | PRN
  Administered 2024-12-21: 40 mg via INTRAVENOUS

## 2024-12-21 MED ORDER — OXYCODONE HCL 5 MG PO TABS: 5.0000 mg | ORAL_TABLET | Freq: Once | ORAL | Status: DC | PRN

## 2024-12-21 MED ORDER — KETAMINE HCL 50 MG/5ML IJ SOSY
PREFILLED_SYRINGE | INTRAMUSCULAR | Status: AC
  Filled 2024-12-21: qty 5

## 2024-12-21 MED ORDER — BUPIVACAINE HCL (PF) 0.25 % IJ SOLN
INTRAMUSCULAR | Status: AC
  Filled 2024-12-21: qty 60

## 2024-12-21 MED ORDER — EPHEDRINE 5 MG/ML INJ
INTRAVENOUS | Status: AC
  Filled 2024-12-21: qty 5

## 2024-12-21 MED ORDER — PHENAZOPYRIDINE HCL 100 MG PO TABS
200.0000 mg | ORAL_TABLET | ORAL | Status: AC
  Administered 2024-12-21: 200 mg via ORAL

## 2024-12-21 MED ORDER — LIDOCAINE 2% (20 MG/ML) 5 ML SYRINGE
INTRAMUSCULAR | Status: AC
  Filled 2024-12-21: qty 5

## 2024-12-21 MED ORDER — PHENYLEPHRINE HCL-NACL 20-0.9 MG/250ML-% IV SOLN
INTRAVENOUS | Status: DC | PRN
  Administered 2024-12-21: 20 ug/min via INTRAVENOUS

## 2024-12-21 MED ORDER — ROCURONIUM BROMIDE 10 MG/ML (PF) SYRINGE
PREFILLED_SYRINGE | INTRAVENOUS | Status: AC
  Filled 2024-12-21: qty 10

## 2024-12-21 SURGICAL SUPPLY — 74 items
BAG URINE DRAIN 2000ML AR STRL (UROLOGICAL SUPPLIES) ×3 IMPLANT
BLADE CLIPPER SENSICLIP SURGIC (BLADE) ×3 IMPLANT
BLADE SURG 15 STRL LF DISP TIS (BLADE) ×3 IMPLANT
CATH FOLEY 3WAY 5CC 16FR (CATHETERS) ×3 IMPLANT
CHLORAPREP W/TINT 26 (MISCELLANEOUS) ×3 IMPLANT
COVER BACK TABLE 60X90IN (DRAPES) ×3 IMPLANT
COVER TIP SHEARS 8 DVNC (MISCELLANEOUS) ×3 IMPLANT
DEFOGGER SCOPE WARM SEASHARP (MISCELLANEOUS) ×3 IMPLANT
DERMABOND ADVANCED .7 DNX12 (GAUZE/BANDAGES/DRESSINGS) ×3 IMPLANT
DEVICE CAPIO SLIM SINGLE (INSTRUMENTS) IMPLANT
DRAPE ARM DVNC X/XI (DISPOSABLE) ×12 IMPLANT
DRAPE COLUMN DVNC XI (DISPOSABLE) ×3 IMPLANT
DRAPE SHEET LG 3/4 BI-LAMINATE (DRAPES) ×3 IMPLANT
DRAPE SURG IRRIG POUCH 19X23 (DRAPES) ×3 IMPLANT
DRAPE UTILITY XL STRL (DRAPES) ×3 IMPLANT
DRIVER NDLE MEGA SUTCUT DVNCXI (INSTRUMENTS) IMPLANT
ELECTRODE REM PT RTRN 9FT ADLT (ELECTROSURGICAL) ×3 IMPLANT
FORCEPS BPLR FENES DVNC XI (FORCEP) IMPLANT
GAUZE 4X4 16PLY ~~LOC~~+RFID DBL (SPONGE) IMPLANT
GLOVE BIOGEL PI IND STRL 6.5 (GLOVE) ×3 IMPLANT
GLOVE BIOGEL PI IND STRL 7.0 (GLOVE) ×6 IMPLANT
GLOVE BIOGEL PI MICRO STRL 6 (GLOVE) ×9 IMPLANT
GLOVE SURG UNDER POLY LF SZ6.5 (GLOVE) ×12 IMPLANT
GOWN STRL REUS W/ TWL LRG LVL3 (GOWN DISPOSABLE) ×3 IMPLANT
GOWN STRL REUS W/TWL LRG LVL3 (GOWN DISPOSABLE) ×3 IMPLANT
GRASPER TIP-UP FEN DVNC XI (INSTRUMENTS) IMPLANT
HIBICLENS CHG 4% 4OZ BTL (MISCELLANEOUS) ×6 IMPLANT
HOLDER FOLEY CATH W/STRAP (MISCELLANEOUS) ×3 IMPLANT
IRRIGATION SUCT STRKRFLW 2 WTP (MISCELLANEOUS) ×3 IMPLANT
KIT PINK PAD W/HEAD ARM REST (MISCELLANEOUS) ×3 IMPLANT
KIT TURNOVER KIT B (KITS) ×3 IMPLANT
LEGGING LITHOTOMY PAIR STRL (DRAPES) ×3 IMPLANT
MANIFOLD NEPTUNE II (INSTRUMENTS) ×3 IMPLANT
MANIPULATOR ADVINCU DEL 2.5 PL (MISCELLANEOUS) IMPLANT
MANIPULATOR ADVINCU DEL 3.0 PL (MISCELLANEOUS) IMPLANT
MANIPULATOR ADVINCU DEL 3.5 PL (MISCELLANEOUS) IMPLANT
MANIPULATOR ADVINCU DEL 4.0 PL (MISCELLANEOUS) IMPLANT
MESH VERTESSA LITE -Y 2X4X3 (Mesh General) ×3 IMPLANT
NEEDLE HYPO 22X1.5 SAFETY MO (MISCELLANEOUS) ×3 IMPLANT
NEEDLE INSUFFLATION 14GA 120MM (NEEDLE) ×3 IMPLANT
NEEDLE MAYO 6 CRC TAPER PT (NEEDLE) IMPLANT
OBTURATOR OPTICALSTD 8 DVNC (TROCAR) ×3 IMPLANT
PACK CYSTO (CUSTOM PROCEDURE TRAY) ×3 IMPLANT
PACK ROBOT WH (CUSTOM PROCEDURE TRAY) ×3 IMPLANT
PACK ROBOTIC GOWN (GOWN DISPOSABLE) ×3 IMPLANT
PACK VAGINAL WOMENS (CUSTOM PROCEDURE TRAY) ×3 IMPLANT
PAD OB MATERNITY 11 LF (PERSONAL CARE ITEMS) ×3 IMPLANT
PATTIES SURGICAL .5 X3 (DISPOSABLE) IMPLANT
RETRACTOR LONE STAR DISPOSABLE (INSTRUMENTS) ×3 IMPLANT
RETRACTOR STAY HOOK 5MM (MISCELLANEOUS) ×3 IMPLANT
SCISSORS MNPLR CVD DVNC XI (INSTRUMENTS) IMPLANT
SEAL UNIV 5-12 XI (MISCELLANEOUS) ×15 IMPLANT
SEALER VESSEL EXT DVNC XI (MISCELLANEOUS) IMPLANT
SET CYSTO IRRIGATION (SET/KITS/TRAYS/PACK) ×3 IMPLANT
SET IRRIG Y-TYPE CYSTO (SET/KITS/TRAYS/PACK) ×3 IMPLANT
SET TUBE SMOKE EVAC HIGH FLOW (TUBING) ×3 IMPLANT
SLEEVE SCD COMPRESS KNEE MED (STOCKING) ×3 IMPLANT
SOLN 0.9% NACL POUR BTL 1000ML (IV SOLUTION) ×3 IMPLANT
SPIKE FLUID TRANSFER (MISCELLANEOUS) ×6 IMPLANT
SURGIFLO W/THROMBIN 8M KIT (HEMOSTASIS) IMPLANT
SUT ABS MONO DBL WITH NDL 48IN (SUTURE) IMPLANT
SUT GORETEX NAB #0 THX26 36IN (SUTURE) ×6 IMPLANT
SUT MNCRL AB 4-0 PS2 18 (SUTURE) ×6 IMPLANT
SUT MON AB 2-0 SH 27 (SUTURE) ×3 IMPLANT
SUT PDS PLUS AB 0 CT-2 (SUTURE) IMPLANT
SUT VIC AB 0 CT1 27XBRD ANBCTR (SUTURE) ×3 IMPLANT
SUT VICRYL 2-0 SH 8X27 (SUTURE) ×3 IMPLANT
SUT VLOC 180 0 9IN GS21 (SUTURE) ×6 IMPLANT
SUTURE STRAT PDS 2-0 23 CT-2.5 (SUTURE) ×6 IMPLANT
SYR BULB EAR ULCER 3OZ GRN STR (SYRINGE) ×3 IMPLANT
TOWEL GREEN STERILE (TOWEL DISPOSABLE) ×3 IMPLANT
TOWEL GREEN STERILE FF (TOWEL DISPOSABLE) ×3 IMPLANT
TRAY FOLEY W/BAG SLVR 14FR LF (SET/KITS/TRAYS/PACK) ×3 IMPLANT
UNDERPAD 30X36 HEAVY ABSORB (UNDERPADS AND DIAPERS) ×3 IMPLANT

## 2024-12-21 NOTE — Anesthesia Procedure Notes (Signed)
 Procedure Name: Intubation Date/Time: 12/21/2024 7:47 AM  Performed by: Hedy Jarred, CRNAPre-anesthesia Checklist: Patient identified, Emergency Drugs available, Suction available and Patient being monitored Patient Re-evaluated:Patient Re-evaluated prior to induction Oxygen Delivery Method: Circle System Utilized Preoxygenation: Pre-oxygenation with 100% oxygen Induction Type: IV induction Ventilation: Mask ventilation without difficulty Laryngoscope Size: Mac and 3 Grade View: Grade I Tube type: Oral Tube size: 7.0 mm Number of attempts: 1 Airway Equipment and Method: Stylet and Oral airway Placement Confirmation: ETT inserted through vocal cords under direct vision, positive ETCO2 and breath sounds checked- equal and bilateral Secured at: 21 cm Tube secured with: Tape Dental Injury: Teeth and Oropharynx as per pre-operative assessment

## 2024-12-21 NOTE — Telephone Encounter (Signed)
 [name] underwent robotic revision sacrocolpopexy, cystoscopy on 12/21/2024.   She failed her voiding trial.  was backfilled into the bladder She was unable to void  She was discharged with a catheter. Please call her for a routine post op check and to schedule a voiding trial by Wednesday 12/31. Thanks!  Rosaline LOISE Caper, MD

## 2024-12-21 NOTE — Discharge Instructions (Signed)

## 2024-12-21 NOTE — Op Note (Signed)
 Operative Note  Preoperative Diagnosis: anterior vaginal prolapse  Postoperative Diagnosis: same  Procedures performed:  Robotic assisted revision sacrocolpopexy, cystoscopy  Implants:  Implant Name Type Inv. Item Serial No. Manufacturer Lot No. LRB No. Used Action  MESH LAJUANDA MAJORIE SE [id number] Mesh General MESH LAJUANDA MAJORIE SE NITA  [facility] MEDICAL [phone] N/A 1 Implanted    Attending Surgeon: Rosaline Caper, MD  Assistant: Jorene Moats, RNFA  Anesthesia: General endotracheal  Findings: 1. On vaginal exam, stage II isolated anterior prolapse present  2. On cystoscopy, normal bladder and urethral mucosa without injury or lesion. Brisk bilateral ureteral efflux present.    3. On laparoscopy, adhesions noted in right upper abdomen. Previously placed sacrocolpopexy mesh well attached to the sacrum and vagina.   Specimens: none  Estimated blood loss: 15 mL  IV fluids: 250 mL  Urine output: 400 mL  Complications: none  Procedure in Detail:  After informed consent was obtained, the patient was taken to the operating room, where general anesthesia was induced and found to be adequate. She was placed in dorsolithotomy position in yellowfin stirrups. Her hips were noted not to be hyperflexed or hyperextended. Her arms were padded with gel pads and tucked to her sides. Her hands were surrounded by foam. A padded strap was placed across her chest with foam between the pad and her skin. She was noted to be appropriately positioned with all pressure points well padded and off tension. A tilt test showed no slippage. She was prepped and draped in the usual sterile fashion.  A sterile Foley catheter was inserted.   0.25% plain Marcaine  was injected at the umbilicus and an incision was made with a scalpel. A Veress needle was inserted into the incision, CO2 insufflation was started, a low opening pressure was noted, and pneumoperitoneum was obtained. The Veress needle was  removed and a 8mm robotic trocar was placed with direct visualization with 5mm scope. Entry into the peritoneal cavity was confirmed. After determining placement for the other ports, local anesthetic was injected at each site and two 8 mm incisions were made for robotic ports at 10 cm lateral to and at the level of the umbilical port. Two additional 8 mm incisions were made 10 cm lateral to these and 30 degrees down followed by 8 mm robotic ports - the right side for an assistant port. All trocars were placed sequentially under direct visualization of the camera. Adhesions were noted in right upper quadrant but were avoided with trocar placement.  The patient was placed in Trendelenburg. The robot was docked on the patient's right side. Monopolar endoshears were placed in the right arm, a [place]  bipolar grasper was placed in the 2nd arm of the patient's left side, and a Tip up grasper was placed in the 3rd arm on the patient's left side.    Small bowel was initially adhesed to the sacrum with filmy adhesions but these were lysed. The mesh appeared to be well adhered to the sacrum with suspension of the vaginal cuff. The peritoneum was opened below the bladder edge to identify the anterior vaginal mesh. The vesicovaginal space was dissected past the previously placed mesh. With a lucite probe in the vagina, further anterior vaginal dissection was then performed with sharp dissection and electrosurgery to separate the vesicovaginal space to the level of the urethra. There were some adhesions of the bladder to the vagina on the right that required sharp dissection. This caused thinning of the vagina and with further traction,  a small hole into the vaginal cavity. Once dissection was complete, the hole was closed with a  2-0 stratafix suture in 2 layers with the second layer imbricating the first to achieve full thickness of the tissue.  One leaflet of the vertessa Y mesh was then inserted into the abdomen after  trimming to appropriate size. With the probe in the vagina, the anterior leaf of the mesh was affixed to the anterior portion of the vagina using a 2-0 stratafix suture in a spiral pattern to distribute the suture evenly across the surface of the anterior mesh leaf.  The distal end of the mesh was then elevated to a level which appropriately supported the anterior vaginal wall. This was then attached to the previously placed sacral arm of the mesh with 5 interrupted CV2 goretex sutures. The peritoneum was reapproximated over the mesh using 2-0 monocryl.  All areas were carefully inspected and noted to be hemostatic as the CO2 gas was deflated. All instruments were removed from the patient's abdomen.    The Foley catheter was removed.  A 70-degree cystoscope was introduced, and 360-degree inspection revealed no injury, lesion or foreign body in the bladder. Brisk bilateral ureteral efflux was noted with the assistance of pyridium .  The bladder was drained and the cystoscope was removed.  The Foley catheter was replaced.   The robot was undocked. The CO2 gas was removed and the ports were removed.  The skin incisions were closed with subcutaneous stitches of 4-0 Monocryl and covered with skin glue.    The patient tolerated the procedure well. Sponge, lap, and needle counts were correct x 2. She was awakened from anesthesia and transferred to the recovery room in stable condition.   Rosaline LOISE Caper, MD

## 2024-12-21 NOTE — Interval H&P Note (Signed)
 History and Physical Interval Note:  12/21/2024 7:14 AM  [name]  has presented today for surgery, with the diagnosis of Prolapse of anterior vaginal wall.  The various methods of treatment have been discussed with the patient and family. After consideration of risks, benefits and other options for treatment, the patient has consented to  Procedures with comments: SACROCOLPOPEXY, ROBOT-ASSISTED, LAPAROSCOPIC (N/A) COLPORRHAPHY, ANTERIOR, FOR CYSTOCELE REPAIR (N/A) - possible anterior repair SUSPENSION, VAGINAL VAULT (N/A) - possible sacrospinous fixation CYSTOSCOPY (N/A) REPAIR, DEFECT, PARAVAGINAL (N/A) - possible paravaginal repair and possible placement of biologic graft as a surgical intervention.  The patient's history has been reviewed, patient examined, no change in status, stable for surgery.  I have reviewed the patient's chart and labs.  Questions were answered to the patient's satisfaction.     [name]

## 2024-12-21 NOTE — Transfer of Care (Signed)
 Immediate Anesthesia Transfer of Care Note  Patient: [name]  Procedure(s) Performed: SACROCOLPOPEXY, ROBOT-ASSISTED, LAPAROSCOPIC (Pelvis) CYSTOSCOPY (Bladder)  Patient Location: PACU  Anesthesia Type:General  Level of Consciousness: drowsy  Airway & Oxygen Therapy: Patient Spontanous Breathing and Patient connected to face mask oxygen  Post-op Assessment: Report given to RN and Post -op Vital signs reviewed and stable  Post vital signs: Reviewed and stable  Last Vitals:  Vitals Value Taken Time  BP 143/62 12/21/24 11:00  Temp 36.6 C 12/21/24 10:52  Pulse 83 12/21/24 11:00  Resp 16 12/21/24 11:00  SpO2 97 % 12/21/24 11:00  Vitals shown include unfiled device data.  Last Pain:  Vitals:   12/21/24 1052  TempSrc:   PainSc: Asleep      Patients Stated Pain Goal: 3 (12/21/24 0618)  Complications: No notable events documented.

## 2024-12-21 NOTE — Anesthesia Preprocedure Evaluation (Signed)
"                                    Anesthesia Evaluation  Patient identified by MRN, date of birth, ID band Patient awake    Reviewed: Allergy & Precautions, H&P , NPO status , Patient's Chart, lab work & pertinent test results  History of Anesthesia Complications (+) PONV and history of anesthetic complications  Airway Mallampati: II   Neck ROM: full    Dental   Pulmonary sleep apnea , former smoker   breath sounds clear to auscultation       Cardiovascular  Rhythm:regular Rate:Normal     Neuro/Psych  Headaches PSYCHIATRIC DISORDERS Anxiety Depression       GI/Hepatic ,GERD  ,,  Endo/Other    Renal/GU      Musculoskeletal   Abdominal   Peds  Hematology   Anesthesia Other Findings   Reproductive/Obstetrics                              Anesthesia Physical Anesthesia Plan  ASA: 2  Anesthesia Plan: General   Post-op Pain Management:    Induction: Intravenous  PONV Risk Score and Plan: 4 or greater and Ondansetron , Dexamethasone , Midazolam , Scopolamine  patch - Pre-op and Treatment may vary due to age or medical condition  Airway Management Planned: Oral ETT  Additional Equipment:   Intra-op Plan:   Post-operative Plan: Extubation in OR  Informed Consent: I have reviewed the patients History and Physical, chart, labs and discussed the procedure including the risks, benefits and alternatives for the proposed anesthesia with the patient or authorized representative who has indicated his/her understanding and acceptance.     Dental advisory given  Plan Discussed with: CRNA, Anesthesiologist and Surgeon  Anesthesia Plan Comments:         Anesthesia Quick Evaluation  "

## 2024-12-22 ENCOUNTER — Encounter (HOSPITAL_COMMUNITY): Payer: Self-pay | Admitting: Obstetrics and Gynecology

## 2024-12-23 ENCOUNTER — Ambulatory Visit

## 2024-12-23 VITALS — BP 98/62 | HR 57

## 2024-12-23 DIAGNOSIS — Z48816 Encounter for surgical aftercare following surgery on the genitourinary system: Secondary | ICD-10-CM

## 2024-12-23 DIAGNOSIS — Z9889 Other specified postprocedural states: Secondary | ICD-10-CM

## 2024-12-23 NOTE — Anesthesia Postprocedure Evaluation (Signed)
"   Anesthesia Post Note  Patient: [name]  Procedure(s) Performed: SACROCOLPOPEXY, ROBOT-ASSISTED, LAPAROSCOPIC (Pelvis) CYSTOSCOPY (Bladder)     Patient location during evaluation: PACU Anesthesia Type: General Level of consciousness: awake and alert Pain management: pain level controlled Vital Signs Assessment: post-procedure vital signs reviewed and stable Respiratory status: spontaneous breathing, nonlabored ventilation, respiratory function stable and patient connected to nasal cannula oxygen Cardiovascular status: blood pressure returned to baseline and stable Postop Assessment: no apparent nausea or vomiting Anesthetic complications: no   No notable events documented.  Last Vitals:  Vitals:   12/21/24 1130 12/21/24 1145  BP: 129/68 129/72  Pulse: 84 80  Resp: 13 12  Temp:  36.7 C  SpO2: 97% 97%    Last Pain:  Vitals:   12/21/24 1145  TempSrc:   PainSc: 4                  Armanda Forand S      "

## 2024-12-23 NOTE — Progress Notes (Addendum)
 [name]  underwent Sacrocolpopexy, Robot-assisted, Laparoscopic and Cystoscopy  on 12/21/2024  with [x] Dr Marilynne [] Dr Guadlupe.  The patient reports that her pain is controlled.  She is taking [] No Medication [x] Acetaminophen  500mg  every 6 hours [x] Ibuprofen  600mg  every 6 hours or [x]  Prescribed Narcotic as needed.  Her pain level is 3[] with medication [] Without medication is.   She denies vaginal bleeding.  The patient is tolerating PO fluids and solids. She has not had a bowel movement and is taking Miralax  for a bowel regimen. She is not passing gas.  She was discharged with a catheter.   [x] Discharged with a catheter, the patient is not having any concerns with her catheter.  She will return for a voiding trial. [] Verified scheduled date and time with patient.  She does not having any additional questions.  Reviewed Post operative instructions as needed to answer additional questions.  Patient will call at 2pm with an update.  Patient contacted at 228pm. States she is urinating well. Has some minor leakage, but getting better the more her bladder wakes up.  CC'd note to patient's provider.

## 2024-12-23 NOTE — Patient Instructions (Signed)
 Please call us  before 2pm with an update.  It was a pleasure to see you today!  Thank you for trusting me with your care!

## 2024-12-23 NOTE — Progress Notes (Signed)
 [name] underwent Sacrocolpopexy, Robot-assisted, Laparoscopic and Cystoscopy on 12/21/2024  She presents for a voiding trial.   Patient was identified with 2 identifiers.  The patient states she does not have any concerns with the foley placed.  300 mL of NS was instilled into the bladder via a catheter.  The catheter was removed and patient was instructed to void into the urinary hat.  She voided 300 mL.  The post void residual measured by bladder scan was 13 mL.  She did pass the voiding trial.  The patient was not sent home with a catheter.    The patient received aftercare instructions and will follow up as scheduled.     She will call at 2 pm with an update.

## 2024-12-29 ENCOUNTER — Ambulatory Visit: Admitting: Psychology

## 2024-12-29 DIAGNOSIS — F4321 Adjustment disorder with depressed mood: Secondary | ICD-10-CM

## 2024-12-29 DIAGNOSIS — F411 Generalized anxiety disorder: Secondary | ICD-10-CM

## 2024-12-29 NOTE — Progress Notes (Signed)
 "     [facility] Counselor/Therapist Progress Note  Patient ID: [name], MRN: [id number],    Date: 12/29/2024   Time Spent: 8:01am-8:55am  Treatment Type: Individual Therapy  Pt is seen for a virtual video visit via caregility. Pt consents to virtual visit and is aware of limitations of virtual visits.  Pt joins from her home, reporting privacy, and counselor from her home office.     Reported Symptoms: pt reports anxiety,  grief.  Mental Status Exam: Appearance:  Well Groomed     Behavior: Appropriate  Motor: Normal  Speech/Language:  Normal Rate  Affect: Appropriate  Mood: anxious and sad  Thought process: normal  Thought content:   WNL  Sensory/Perceptual disturbances:   WNL  Orientation: oriented to person, place, time/date, and situation  Attention: Good  Concentration: Good  Memory: WNL  Fund of knowledge:  Good  Insight:   Good  Judgment:  Good  Impulse Control: Good   Risk Assessment: Danger to Self:  No Self-injurious Behavior: No Danger to Others: No Duty to Warn:no Physical Aggression / Violence:No  Access to Firearms a concern: No  Gang Involvement:No   Subjective: Counselor assessed pt current functioning per pt report.  Processed w/pt emotions and grief.   Provided psychoeducation re: grief.  Discussed grief and holidays and how increased intensity w/ decreased distractions.  Explored interactions w/ family and grandkids and not avoiding communication, feelings or interactions.   Pt affect congruent w/ report of anxiety and sadness. Pt reports she has been having increased sadness and restless w/ grief over past week.  Pt reports that was difficult in the quiet times of the holidays even though norm of decreased interactions on those days.  Pt increased awareness w/ less distractions her loss/grief seems more present.  Pt discussed how felt awkward w/ daugther in law on Christmas eve and worried about what to say or not.  Pt also  discussed feeling that can't have certain emotions w/ husband.  Pt reflected on ways of engaging w/ family and ways of expressing and not avoiding.  Interventions: Cognitive Behavioral Therapy and supportive and self compassion  Diagnosis:Generalized anxiety disorder  Grief  Plan: Pt to f/u w/ Counseling in 2 weeks.  Pt to f/u w/ PCP, Cardiologist and [facility] as scheduled.   Individualized Treatment Plan Strengths: enjoys walking and movement, enjoys the beach, family is important to her  Supports: her husband, her daughter in law and her mom    Goal/Needs for Treatment:  In order of importance to patient 1) cope with anxiety 2) increase self worth and decrease negative thought patterns.  3) continue cope through losses       Client Statement of Needs:   talk about my grief, continue to work through my anxiety, try not be so negative on stuff and thinking things are going to go bad or wrong.  Finding time for self again.        Treatment Level: outpatient counseling  Symptoms:anxiety, worry, ruminating thoughts, low self worth, emotional escalations, depressed mood, sleep disturbance  Client Treatment Preferences: counseling every 2-4 weeks and continue medication management w/ PCP    Healthcare consumer's goal for treatment:   Counselor, Damien Herald, [facility] will support the patient's ability to achieve the goals identified. Cognitive Behavioral Therapy, Assertive Communication/Conflict Resolution Training, Relaxation Training, ACT, Humanistic and other evidenced-based practices will be used to promote progress towards healthy functioning.    Healthcare consumer will: Actively participate in  therapy, working towards healthy functioning.     *Justification for Continuation/Discontinuation of Goal: R=Revised, O=Ongoing, A=Achieved, D=Discontinued   Goal 1) Pt will increase coping with life's anxiety and stressors AEB decreased ruminating worry, decreased  emotional escalations, decreased negative thought patterns. That contribute to anxiety. Baseline date 03/10/24: Progress towards goal 40%; How Often - Daily Target Date Goal Was reviewed Status Code Progress towards goal  03/10/25                            Goal 2)  Increase self awareness, consistency w/ self care time and self compassion statements AEB pt report and therapist observation.   Baseline date 03/10/24: Progress towards goal 30; How Often - Daily Target Date Goal Was reviewed Status Code Progress towards goal/Likert rating  03/10/25                            Goal 3) Verbalize feelings of grief related to losses and recognize ways of living with grief present.  Baseline date 03/10/24: Progress towards goal 30; How Often - Daily Target Date Goal Was reviewed Status Code Progress towards goal/Likert rating  03/10/25                            This plan has been reviewed and created by the following participants:  This plan will be reviewed at least every 12 months. Date Behavioral Health Clinician Date Guardian/Patient   03/10/24 [facility] Brittany [facility]                      03/10/24 Verbal consent provided and request for electronic signature sent and obtained              [name] [facility] "

## 2025-01-04 ENCOUNTER — Encounter: Payer: Self-pay | Admitting: Obstetrics and Gynecology

## 2025-01-04 ENCOUNTER — Encounter: Payer: Self-pay | Admitting: *Deleted

## 2025-01-05 ENCOUNTER — Ambulatory Visit

## 2025-01-05 ENCOUNTER — Ambulatory Visit: Payer: Self-pay | Admitting: Obstetrics

## 2025-01-05 ENCOUNTER — Other Ambulatory Visit (HOSPITAL_COMMUNITY)
Admission: RE | Admit: 2025-01-05 | Discharge: 2025-01-05 | Disposition: A | Discharge status: Home / Self Care | Source: Ambulatory Visit | Attending: Obstetrics and Gynecology | Admitting: Obstetrics and Gynecology

## 2025-01-05 VITALS — BP 111/70 | HR 72

## 2025-01-05 DIAGNOSIS — R82998 Other abnormal findings in urine: Secondary | ICD-10-CM | POA: Insufficient documentation

## 2025-01-05 DIAGNOSIS — R319 Hematuria, unspecified: Secondary | ICD-10-CM

## 2025-01-05 DIAGNOSIS — R350 Frequency of micturition: Secondary | ICD-10-CM

## 2025-01-05 LAB — POCT URINALYSIS DIP (CLINITEK)
Bilirubin, UA: NEGATIVE
Glucose, UA: NEGATIVE mg/dL
Ketones, POC UA: NEGATIVE mg/dL
Nitrite, UA: NEGATIVE
POC PROTEIN,UA: NEGATIVE
Spec Grav, UA: <=1.005 — AB
Urobilinogen, UA: 0.2 U/dL
pH, UA: 6

## 2025-01-05 LAB — URINALYSIS, COMPLETE (UACMP) WITH MICROSCOPIC
Bilirubin Urine: NEGATIVE
Glucose, UA: NEGATIVE mg/dL
Ketones, ur: NEGATIVE mg/dL
Nitrite: NEGATIVE
Protein, ur: NEGATIVE mg/dL
Specific Gravity, Urine: 1.005 (ref 1.005–1.030)
pH: 6 (ref 5.0–8.0)

## 2025-01-05 MED ORDER — NITROFURANTOIN MONOHYD MACRO 100 MG PO CAPS: 100.0000 mg | ORAL_CAPSULE | Freq: Two times a day (BID) | ORAL | 0 refills | Status: AC

## 2025-01-05 NOTE — Patient Instructions (Signed)
 Your Urine dip that was done in office was Positive. I am sending the urine off for culture and you can take AZO over the counter for your discomfort.  We have ordered Macrobid  for you to take while we wait for your culture results, hopefully this gives you some relief. We will contact you when the results are back between 3-5 days.  If a different antibiotic is needed we will sent the order to the pharmacy and you will be notified. If you have any questions or concerns please feel free to call us  at [phone]

## 2025-01-05 NOTE — Telephone Encounter (Signed)
 Patient has been scheduled to come in for nurse visit.

## 2025-01-05 NOTE — Progress Notes (Signed)
 [name] arrived today with dysuria. Patient is notexperiencing fever, unstable vitals and/or one-sided back flank pain. Patient has not had had a recent hospitalization due to UTI.  Last visit in the office was 12/23/2024.  Per protocol:   The most recent Urinalysis completed on 09/262024 and wasnormal.  Last Creatinine level  Lab Results  Component Value Date   CREATININE 0.72 10/08/2024    An urine specimen was collected and POCT urinalysis completed. [] A cath specimen was collected due to patient's current condition, symptoms or post-procedural state.  Total urine output by catheter is  Output by Drain (mL) 01/03/25 0701 - 01/03/25 1900 01/03/25 1901 - 01/04/25 0700 01/04/25 0701 - 01/04/25 1900 01/04/25 1901 - 01/05/25 0700 01/05/25 0701 - 01/05/25 1341  Requested LDAs do not have output data documented.    [name]    POCT Urine results is not normal.  Urine micro was sent per protocol for abnormal urinalysis.  Urine culture was sent per protocol for abnormal urinalysis.     [] Pt was notified of positive urine results and plan for additional urine testing. We will contact you within the next 3-4 days with these results.  [] No Prescription was sent to your pharmacy.  The additional testing will indicate if a prescription is needed.   [] Patient was notified of abnormal urine results. The following prescription is sent to your preferred pharmacy.  []  Macrobid  100mg  #10 1 tablet by mouth twice daily with food for 5 days      []  Bactrim  DS 800-160mg  #6 1 tablet by mouth twice daily for 3 days        []  Due to your current medication allergies, an alternate prescription was discussed with your provider and will be prescribed and sent to your pharmacy.  [] You can take over the counter AZO two tablets up to three times a day for two days.  Take AZO tablets with a full glass of water . AZO will turn your urine orange, this is normal.   [] The patient was notified of negative urine results.  If  symptoms persist, you may take over the counter AZO two tablets up to three times a day for two days.  AZO will turn your urine orange, this is normal.  Contact the office back to schedule an appointment if your symptoms persist or worsen or you develop additional symptoms.       CC'd note to patient's provider.

## 2025-01-06 ENCOUNTER — Ambulatory Visit (INDEPENDENT_AMBULATORY_CARE_PROVIDER_SITE_OTHER): Admitting: Family Medicine

## 2025-01-06 ENCOUNTER — Other Ambulatory Visit (INDEPENDENT_AMBULATORY_CARE_PROVIDER_SITE_OTHER): Payer: Self-pay | Admitting: Family Medicine

## 2025-01-07 LAB — URINE CULTURE: Culture: 10000 — AB

## 2025-01-12 ENCOUNTER — Ambulatory Visit: Admitting: Psychology

## 2025-01-12 DIAGNOSIS — F4321 Adjustment disorder with depressed mood: Secondary | ICD-10-CM

## 2025-01-12 DIAGNOSIS — F411 Generalized anxiety disorder: Secondary | ICD-10-CM | POA: Diagnosis not present

## 2025-01-12 NOTE — Progress Notes (Signed)
 "     [facility] Counselor/Therapist Progress Note  Patient ID: [name], MRN: [id number],    Date: 01/12/25   Time Spent: 12:00pm-12:54pm  Treatment Type: Individual Therapy  Pt is seen for a virtual video visit via caregility. Pt consents to virtual visit and is aware of limitations of virtual visits.  Pt joins from her home, reporting privacy, and counselor from her home office.     Reported Symptoms: pt reports anxiety and  grief.  Mental Status Exam: Appearance:  Well Groomed     Behavior: Appropriate  Motor: Normal  Speech/Language:  Normal Rate  Affect: Appropriate  Mood: anxious and sad  Thought process: normal  Thought content:   WNL  Sensory/Perceptual disturbances:   WNL  Orientation: oriented to person, place, time/date, and situation  Attention: Good  Concentration: Good  Memory: WNL  Fund of knowledge:  Good  Insight:   Good  Judgment:  Good  Impulse Control: Good   Risk Assessment: Danger to Self:  No Self-injurious Behavior: No Danger to Others: No Duty to Warn:no Physical Aggression / Violence:No  Access to Firearms a concern: No  Gang Involvement:No   Subjective: Counselor assessed pt current functioning per pt report.  Processed w/pt emotions and grief.    Explored interactions w/ husband and feeling distant in their grief.  Discussed ways to express and share in grief and not try to protect others emotions. Discussed transfer of care, progress made in counseling w/ this provider and acknowledge emotions w/ termination of therapuetic relationship. Pt affect congruent w/ grief- tearful at times.  Pt reports that she has been feeling more distant in her grief from husband and worries that her emotions trigger his emotions and feels that can't cry in presence.  Pt contributes this also to his not wanting to talk about emotions.  Pt discussed other interactions w/ her daughter in law, grandkids and son and ways acknowledging and being  present in their emotions/grief.  Pt recognized ways can not avoid emotions that surface in presence of family.  Pt expressed feeling grateful for counseling received and feeling that will miss current provider.   Interventions: Cognitive Behavioral Therapy and supportive and self compassion  Diagnosis:Generalized anxiety disorder  Grief  Plan: Pt to f/u w/ counseling as scheduled w/ Wyatt Fire as agreed on between pt and counselors.  Pt to f/u w/ PCP, Cardiologist and [facility] as scheduled.   Individualized Treatment Plan Strengths: enjoys walking and movement, enjoys the beach, family is important to her  Supports: her husband, her daughter in law and her mom    Goal/Needs for Treatment:  In order of importance to patient 1) cope with anxiety 2) increase self worth and decrease negative thought patterns.  3) continue cope through losses       Client Statement of Needs:   talk about my grief, continue to work through my anxiety, try not be so negative on stuff and thinking things are going to go bad or wrong.  Finding time for self again.        Treatment Level: outpatient counseling  Symptoms:anxiety, worry, ruminating thoughts, low self worth, emotional escalations, depressed mood, sleep disturbance  Client Treatment Preferences: counseling every 2-4 weeks and continue medication management w/ PCP    Healthcare consumer's goal for treatment:   Counselor, Damien Herald, [facility] will support the patient's ability to achieve the goals identified. Cognitive Behavioral Therapy, Assertive Communication/Conflict Resolution Training, Relaxation Training, ACT, Humanistic and other  evidenced-based practices will be used to promote progress towards healthy functioning.    Healthcare consumer will: Actively participate in therapy, working towards healthy functioning.     *Justification for Continuation/Discontinuation of Goal: R=Revised, O=Ongoing, A=Achieved,  D=Discontinued   Goal 1) Pt will increase coping with life's anxiety and stressors AEB decreased ruminating worry, decreased emotional escalations, decreased negative thought patterns. That contribute to anxiety. Baseline date 03/10/24: Progress towards goal 40%; How Often - Daily Target Date Goal Was reviewed Status Code Progress towards goal  03/10/25                            Goal 2)  Increase self awareness, consistency w/ self care time and self compassion statements AEB pt report and therapist observation.   Baseline date 03/10/24: Progress towards goal 30; How Often - Daily Target Date Goal Was reviewed Status Code Progress towards goal/Likert rating  03/10/25                            Goal 3) Verbalize feelings of grief related to losses and recognize ways of living with grief present.  Baseline date 03/10/24: Progress towards goal 30; How Often - Daily Target Date Goal Was reviewed Status Code Progress towards goal/Likert rating  03/10/25                            This plan has been reviewed and created by the following participants:  This plan will be reviewed at least every 12 months. Date Behavioral Health Clinician Date Guardian/Patient   03/10/24 [facility] Barbarann [facility]                      03/10/24 Verbal consent provided and request for electronic signature sent and obtained             [name] [facility] "

## 2025-01-13 ENCOUNTER — Encounter: Payer: Self-pay | Admitting: Family Medicine

## 2025-01-15 ENCOUNTER — Other Ambulatory Visit: Payer: Self-pay

## 2025-01-15 MED ORDER — BUPROPION HCL ER (XL) 300 MG PO TB24: 300.0000 mg | ORAL_TABLET | Freq: Every day | ORAL | 3 refills | Status: AC

## 2025-01-18 ENCOUNTER — Ambulatory Visit: Admitting: Psychology

## 2025-01-18 DIAGNOSIS — F5089 Other specified eating disorder: Secondary | ICD-10-CM

## 2025-01-18 DIAGNOSIS — F411 Generalized anxiety disorder: Secondary | ICD-10-CM

## 2025-01-18 DIAGNOSIS — E669 Obesity, unspecified: Secondary | ICD-10-CM

## 2025-01-18 DIAGNOSIS — Z634 Disappearance and death of family member: Secondary | ICD-10-CM

## 2025-01-18 DIAGNOSIS — F908 Attention-deficit hyperactivity disorder, other type: Secondary | ICD-10-CM

## 2025-01-25 NOTE — Addendum Note (Signed)
 Addended by: SHARRON WYATT SQUIBB on: 01/25/2025 11:10 AM   Modules accepted: Level of Service

## 2025-01-26 ENCOUNTER — Ambulatory Visit: Admitting: Psychology

## 2025-01-26 DIAGNOSIS — F908 Attention-deficit hyperactivity disorder, other type: Secondary | ICD-10-CM

## 2025-01-26 DIAGNOSIS — F5089 Other specified eating disorder: Secondary | ICD-10-CM

## 2025-01-26 DIAGNOSIS — Z634 Disappearance and death of family member: Secondary | ICD-10-CM

## 2025-01-26 DIAGNOSIS — F411 Generalized anxiety disorder: Secondary | ICD-10-CM

## 2025-01-26 DIAGNOSIS — E669 Obesity, unspecified: Secondary | ICD-10-CM

## 2025-01-26 NOTE — Progress Notes (Signed)
" ° ° °                             Main Office Phone: [phone] Fax: [phone]  Date: January 26, 2025  Name: [name] MRN: [id number] Appointment Start Time: 10:00am Duration: 64 minutes Type of Session: Individual Therapy  Location of Patient: Home Location of Provider: Provider's Home (private office) Type of Contact: Telepsychological Visit via Caregility (video and audio capabilities)  Session Content: Today's appointment was a telepsychological visit. [name] provided verbal consent for today's telepsychological appointment and she is aware she is responsible for securing confidentiality on her end of the session. Prior to proceeding with today's appointment, [name]'s physical location at the time of this appointment was obtained as well a phone number she could be reached at in the event of technical difficulties. [name] and this provider participated in today's telepsychological service.   This provider conducted a brief check-in and established an agenda for today's appointment. [name] shared, This past weekend has been really rough. As previously discussed, [name] and this provider collaborated on developing a treatment plan. Discussed diagnosis(es). [name] provided verbal approval of this treatment plan and provided verbal consent to proceed with services. This treatment plan will be signed by [name] after this appointment. [name] of today's appointment focused on processing events of the past weekend related to grief. [name] expressed desire to share Valentine's day gifts with her grandchildren that incorporates their father that passed away. Associated thoughts and feelings were processed. She noted a plan to reach out to her daughter in law to discuss her idea. Overall, [name] was receptive to today's appointment as evidenced by openness to sharing, responsiveness to feedback, and willingness to implement discussed strategies .  Mental Status Examination:   Appearance: neat Behavior: appropriate to circumstances Mood: sad Affect: mood congruent and tearful Speech: WNL Eye Contact: appropriate Psychomotor Activity: WNL Gait: unable to assess Thought Process: linear, logical, and goal directed and no evidence or endorsement of suicidal, homicidal, and self-harm ideation, plan and intent  Thought Content/Perception: no hallucinations, delusions, bizarre thinking or behavior endorsed or observed Orientation: AAOx4 Memory/Concentration: intact Insight/Judgment: fair  Interventions:  Conducted a brief chart review Provided empathic reflections and validation Processed thoughts and feelings Employed supportive psychotherapy interventions to facilitate reduced distress, and to improve coping skills with identified stressors Developed a treatment plan  DSM-5 Diagnosis(es): F41.1 Generalized Anxiety Disorder, F50.89 Other Specified Feeding or Eating Disorder, Emotional Eating Behaviors, Z63.4 Uncomplicated Bereavement, F90.9 Unspecified Attention-Deficit/Hyperactivity Disorder, and E66.9 Overweight or Obesity  Treatment Goal(s) & Progress: Progress is limited as treatment was recently initiated and the treatment plan was developed during today's appointment. Treatment plan is attached to this encounter.  Plan: The next appointment is scheduled for 02/05/2025 at 8am, which will be via Caregility. The next session will focus on working towards the established treatment goals. [name] will be sent the treatment plan signature page electronically, which will be signed and returned prior to the next appointment.     Brittany SHAUNNA Fire, PsyD "

## 2025-02-01 ENCOUNTER — Encounter: Admitting: Obstetrics and Gynecology

## 2025-02-05 ENCOUNTER — Ambulatory Visit: Admitting: Psychology

## 2025-02-08 ENCOUNTER — Ambulatory Visit (INDEPENDENT_AMBULATORY_CARE_PROVIDER_SITE_OTHER): Admitting: Family Medicine

## 2025-02-09 ENCOUNTER — Ambulatory Visit: Admitting: Psychology

## 2025-02-15 ENCOUNTER — Ambulatory Visit: Admitting: Psychology
